# Patient Record
Sex: Male | Born: 1935 | Race: White | Hispanic: No | Marital: Married | Smoking: Never smoker
Health system: Southern US, Community
[De-identification: ages and names within clinical notes are randomized; demographics above are authoritative.]

## PROBLEM LIST (undated history)

## (undated) ENCOUNTER — Emergency Department (HOSPITAL_COMMUNITY): Discharge status: Absent without leave | Payer: PRIVATE HEALTH INSURANCE

## (undated) DIAGNOSIS — A4901 Methicillin susceptible Staphylococcus aureus infection, unspecified site: Secondary | ICD-10-CM

## (undated) DIAGNOSIS — F329 Major depressive disorder, single episode, unspecified: Secondary | ICD-10-CM

## (undated) DIAGNOSIS — N3281 Overactive bladder: Secondary | ICD-10-CM

## (undated) DIAGNOSIS — A419 Sepsis, unspecified organism: Secondary | ICD-10-CM

## (undated) DIAGNOSIS — G309 Alzheimer's disease, unspecified: Secondary | ICD-10-CM

## (undated) DIAGNOSIS — I10 Essential (primary) hypertension: Secondary | ICD-10-CM

## (undated) DIAGNOSIS — Z45018 Encounter for adjustment and management of other part of cardiac pacemaker: Secondary | ICD-10-CM

## (undated) DIAGNOSIS — N183 Chronic kidney disease, stage 3 unspecified: Secondary | ICD-10-CM

## (undated) DIAGNOSIS — Z9289 Personal history of other medical treatment: Secondary | ICD-10-CM

## (undated) DIAGNOSIS — M25511 Pain in right shoulder: Secondary | ICD-10-CM

## (undated) DIAGNOSIS — M546 Pain in thoracic spine: Principal | ICD-10-CM

## (undated) DIAGNOSIS — K5900 Constipation, unspecified: Secondary | ICD-10-CM

## (undated) DIAGNOSIS — I482 Chronic atrial fibrillation, unspecified: Secondary | ICD-10-CM

## (undated) DIAGNOSIS — M009 Pyogenic arthritis, unspecified: Secondary | ICD-10-CM

## (undated) DIAGNOSIS — F32A Depression, unspecified: Secondary | ICD-10-CM

## (undated) DIAGNOSIS — T8859XA Other complications of anesthesia, initial encounter: Secondary | ICD-10-CM

## (undated) DIAGNOSIS — G8929 Other chronic pain: Secondary | ICD-10-CM

## (undated) DIAGNOSIS — R001 Bradycardia, unspecified: Secondary | ICD-10-CM

## (undated) DIAGNOSIS — F322 Major depressive disorder, single episode, severe without psychotic features: Secondary | ICD-10-CM

## (undated) DIAGNOSIS — F4321 Adjustment disorder with depressed mood: Secondary | ICD-10-CM

## (undated) DIAGNOSIS — T827XXA Infection and inflammatory reaction due to other cardiac and vascular devices, implants and grafts, initial encounter: Secondary | ICD-10-CM

## (undated) DIAGNOSIS — I509 Heart failure, unspecified: Secondary | ICD-10-CM

## (undated) DIAGNOSIS — D649 Anemia, unspecified: Secondary | ICD-10-CM

## (undated) DIAGNOSIS — F0280 Dementia in other diseases classified elsewhere without behavioral disturbance: Secondary | ICD-10-CM

## (undated) DIAGNOSIS — M1990 Unspecified osteoarthritis, unspecified site: Secondary | ICD-10-CM

## (undated) DIAGNOSIS — I82409 Acute embolism and thrombosis of unspecified deep veins of unspecified lower extremity: Secondary | ICD-10-CM

## (undated) DIAGNOSIS — F419 Anxiety disorder, unspecified: Secondary | ICD-10-CM

## (undated) DIAGNOSIS — R7881 Bacteremia: Secondary | ICD-10-CM

## (undated) DIAGNOSIS — E039 Hypothyroidism, unspecified: Secondary | ICD-10-CM

## (undated) DIAGNOSIS — E119 Type 2 diabetes mellitus without complications: Secondary | ICD-10-CM

## (undated) DIAGNOSIS — T4145XA Adverse effect of unspecified anesthetic, initial encounter: Secondary | ICD-10-CM

## (undated) HISTORY — DX: Methicillin susceptible Staphylococcus aureus infection, unspecified site: A49.01

## (undated) HISTORY — DX: Other chronic pain: G89.29

## (undated) HISTORY — DX: Sepsis, unspecified organism: A41.9

## (undated) HISTORY — DX: Pain in right shoulder: M25.511

## (undated) HISTORY — DX: Overactive bladder: N32.81

## (undated) HISTORY — PX: COLONOSCOPY W/ POLYPECTOMY: SHX1380

## (undated) HISTORY — DX: Adjustment disorder with depressed mood: F43.21

## (undated) HISTORY — PX: EYE SURGERY: SHX253

## (undated) HISTORY — PX: JOINT REPLACEMENT: SHX530

## (undated) HISTORY — PX: INSERT / REPLACE / REMOVE PACEMAKER: SUR710

## (undated) HISTORY — DX: Dementia in other diseases classified elsewhere without behavioral disturbance: F02.80

## (undated) HISTORY — DX: Pain in thoracic spine: M54.6

## (undated) HISTORY — DX: Pyogenic arthritis, unspecified: M00.9

## (undated) HISTORY — DX: Bacteremia: R78.81

## (undated) HISTORY — DX: Infection and inflammatory reaction due to other cardiac and vascular devices, implants and grafts, initial encounter: T82.7XXA

## (undated) HISTORY — DX: Encounter for adjustment and management of other part of cardiac pacemaker: Z45.018

## (undated) HISTORY — DX: Heart failure, unspecified: I50.9

## (undated) HISTORY — DX: Major depressive disorder, single episode, severe without psychotic features: F32.2

## (undated) HISTORY — DX: Alzheimer's disease, unspecified: G30.9

## (undated) HISTORY — DX: Major depressive disorder, single episode, unspecified: F32.9

## (undated) HISTORY — PX: TONSILLECTOMY: SUR1361

---

## 1998-09-02 ENCOUNTER — Ambulatory Visit (HOSPITAL_COMMUNITY): Admission: RE | Admit: 1998-09-02 | Discharge: 1998-09-04 | Payer: Self-pay | Admitting: *Deleted

## 1998-09-02 ENCOUNTER — Encounter: Payer: Self-pay | Admitting: *Deleted

## 1998-12-25 ENCOUNTER — Encounter: Payer: Self-pay | Admitting: *Deleted

## 1998-12-25 ENCOUNTER — Ambulatory Visit (HOSPITAL_COMMUNITY): Admission: RE | Admit: 1998-12-25 | Discharge: 1998-12-25 | Payer: Self-pay | Admitting: *Deleted

## 1999-12-04 ENCOUNTER — Emergency Department (HOSPITAL_COMMUNITY): Admission: EM | Admit: 1999-12-04 | Discharge: 1999-12-04 | Payer: Self-pay | Admitting: Emergency Medicine

## 1999-12-04 ENCOUNTER — Encounter: Payer: Self-pay | Admitting: Emergency Medicine

## 2001-03-21 ENCOUNTER — Encounter: Admission: RE | Admit: 2001-03-21 | Discharge: 2001-06-19 | Payer: Self-pay | Admitting: Occupational Medicine

## 2010-11-11 ENCOUNTER — Encounter (HOSPITAL_COMMUNITY)
Admission: RE | Admit: 2010-11-11 | Discharge: 2010-11-11 | Disposition: A | Discharge status: Home / Self Care | Payer: Medicare Other | Source: Ambulatory Visit | Attending: Orthopedic Surgery | Admitting: Orthopedic Surgery

## 2010-11-11 ENCOUNTER — Other Ambulatory Visit: Payer: Self-pay | Admitting: Orthopedic Surgery

## 2010-11-11 ENCOUNTER — Other Ambulatory Visit (HOSPITAL_COMMUNITY): Payer: Self-pay | Admitting: Orthopedic Surgery

## 2010-11-11 DIAGNOSIS — M1990 Unspecified osteoarthritis, unspecified site: Secondary | ICD-10-CM

## 2010-11-11 LAB — DIFFERENTIAL
Basophils Absolute: 0 10*3/uL (ref 0.0–0.1)
Basophils Relative: 0 % (ref 0–1)
Eosinophils Absolute: 0.1 10*3/uL (ref 0.0–0.7)
Eosinophils Relative: 1 % (ref 0–5)
Lymphocytes Relative: 18 % (ref 12–46)
Lymphs Abs: 1.3 10*3/uL (ref 0.7–4.0)
Monocytes Absolute: 0.9 10*3/uL (ref 0.1–1.0)
Monocytes Relative: 12 % (ref 3–12)
Neutro Abs: 5 10*3/uL (ref 1.7–7.7)
Neutrophils Relative %: 68 % (ref 43–77)

## 2010-11-11 LAB — CBC
Hemoglobin: 11.1 g/dL — ABNORMAL LOW (ref 13.0–17.0)
MCH: 31.4 pg (ref 26.0–34.0)
MCHC: 33.2 g/dL (ref 30.0–36.0)
Platelets: 158 10*3/uL (ref 150–400)
RBC: 3.53 MIL/uL — ABNORMAL LOW (ref 4.22–5.81)

## 2010-11-11 LAB — URINALYSIS, ROUTINE W REFLEX MICROSCOPIC
Glucose, UA: NEGATIVE mg/dL
Hgb urine dipstick: NEGATIVE
Ketones, ur: NEGATIVE mg/dL
Protein, ur: NEGATIVE mg/dL
Urobilinogen, UA: 1 mg/dL (ref 0.0–1.0)

## 2010-11-11 LAB — BASIC METABOLIC PANEL
CO2: 28 mEq/L (ref 19–32)
Chloride: 108 mEq/L (ref 96–112)
Glucose, Bld: 139 mg/dL — ABNORMAL HIGH (ref 70–99)
Sodium: 144 mEq/L (ref 135–145)

## 2010-11-11 LAB — SURGICAL PCR SCREEN
MRSA, PCR: NEGATIVE
Staphylococcus aureus: NEGATIVE

## 2010-11-11 LAB — PROTIME-INR
INR: 1.92 — ABNORMAL HIGH (ref 0.00–1.49)
Prothrombin Time: 22.3 seconds — ABNORMAL HIGH (ref 11.6–15.2)

## 2010-11-11 LAB — URINE MICROSCOPIC-ADD ON

## 2010-11-21 ENCOUNTER — Inpatient Hospital Stay (HOSPITAL_COMMUNITY): Payer: Medicare Other

## 2010-11-21 ENCOUNTER — Inpatient Hospital Stay (HOSPITAL_COMMUNITY)
Admission: RE | Admit: 2010-11-21 | Discharge: 2010-11-25 | DRG: 470 | Disposition: A | Discharge status: Skilled Nursing Facility | Payer: Medicare Other | Source: Ambulatory Visit | Attending: Orthopedic Surgery | Admitting: Orthopedic Surgery

## 2010-11-21 DIAGNOSIS — E119 Type 2 diabetes mellitus without complications: Secondary | ICD-10-CM | POA: Diagnosis present

## 2010-11-21 DIAGNOSIS — Z01818 Encounter for other preprocedural examination: Secondary | ICD-10-CM

## 2010-11-21 DIAGNOSIS — Z01812 Encounter for preprocedural laboratory examination: Secondary | ICD-10-CM

## 2010-11-21 DIAGNOSIS — I10 Essential (primary) hypertension: Secondary | ICD-10-CM | POA: Diagnosis present

## 2010-11-21 DIAGNOSIS — M1610 Unilateral primary osteoarthritis, unspecified hip: Principal | ICD-10-CM | POA: Diagnosis present

## 2010-11-21 DIAGNOSIS — Z01810 Encounter for preprocedural cardiovascular examination: Secondary | ICD-10-CM

## 2010-11-21 DIAGNOSIS — I4891 Unspecified atrial fibrillation: Secondary | ICD-10-CM | POA: Diagnosis present

## 2010-11-21 DIAGNOSIS — M169 Osteoarthritis of hip, unspecified: Principal | ICD-10-CM | POA: Diagnosis present

## 2010-11-21 DIAGNOSIS — Z950 Presence of cardiac pacemaker: Secondary | ICD-10-CM

## 2010-11-21 LAB — GLUCOSE, CAPILLARY
Glucose-Capillary: 119 mg/dL — ABNORMAL HIGH (ref 70–99)
Glucose-Capillary: 176 mg/dL — ABNORMAL HIGH (ref 70–99)

## 2010-11-21 LAB — PROTIME-INR
INR: 1.38 (ref 0.00–1.49)
INR: 1.42 (ref 0.00–1.49)
Prothrombin Time: 17.2 seconds — ABNORMAL HIGH (ref 11.6–15.2)
Prothrombin Time: 17.6 seconds — ABNORMAL HIGH (ref 11.6–15.2)

## 2010-11-22 LAB — CBC
HCT: 23.8 % — ABNORMAL LOW (ref 39.0–52.0)
MCHC: 32.8 g/dL (ref 30.0–36.0)
Platelets: 151 10*3/uL (ref 150–400)
RDW: 13.2 % (ref 11.5–15.5)

## 2010-11-22 LAB — BASIC METABOLIC PANEL
CO2: 26 mEq/L (ref 19–32)
Calcium: 8.1 mg/dL — ABNORMAL LOW (ref 8.4–10.5)
Creatinine, Ser: 1.43 mg/dL — ABNORMAL HIGH (ref 0.50–1.35)
Glucose, Bld: 173 mg/dL — ABNORMAL HIGH (ref 70–99)

## 2010-11-22 LAB — PROTIME-INR: INR: 1.56 — ABNORMAL HIGH (ref 0.00–1.49)

## 2010-11-22 LAB — PREPARE RBC (CROSSMATCH)

## 2010-11-22 LAB — GLUCOSE, CAPILLARY
Glucose-Capillary: 173 mg/dL — ABNORMAL HIGH (ref 70–99)
Glucose-Capillary: 129 mg/dL — ABNORMAL HIGH (ref 70–99)

## 2010-11-22 NOTE — Op Note (Signed)
NAMEDAMARRION, MIMBS             ACCOUNT NO.:  [id number]  MEDICAL RECORD NO.:  [id number]  LOCATION:  5011                         FACILITY:  [facility]  PHYSICIAN:  Feliberto Gottron. Turner Daniels, M.D.   DATE OF BIRTH:  09-03-1930  DATE OF PROCEDURE:  11/21/2010 DATE OF DISCHARGE:                              OPERATIVE REPORT   PREOPERATIVE DIAGNOSES:  End-stage arthritis of the right hip with erosion of the femoral head, large osteophytes, severe unremitting pain.  POSTOPERATIVE DIAGNOSES:  End-stage arthritis of the right hip with erosion of the femoral head, large osteophytes, severe unremitting pain.  PROCEDURE:  Right total hip arthroplasty using a 62-mm DePuy Pinnacle Cup, Apex Hole Eliminator, 10-degree polyethylene liner index superior and posterior to accept +5 36 mm metal head on the femoral side, a #6 Summit basic stem, cemented double batch of DePuy HV cement with 1500 mg of Zinacef, 14 mm centralizer and #5 central canal occluder.  SURGEON:  Feliberto Gottron. Turner Daniels, MD  FIRS ASSISTANT:  Shirl Harris PA-C  ANESTHETIC:  General endotracheal.  ESTIMATED BLOOD LOSS:  500 mL.  FLUID REPLACEMENT:  800 mL of crystalloid.  DRAINS PLACED:  Foley catheter.  URINE OUTPUT:  300 mL.  INDICATIONS FOR PROCEDURE:  A 77 year old male with end-stage arthritis of the right hip for many years, has developed a fairly impressive 25- degree flexion contracture, external rotation contracture, x-ray showed huge osteophytes, bone-on-bone arthritic changes with erosion and flattening of the femoral head.  He has failed conservative measures with observation, anti-inflammatory medicine, narcotics and can now barely ambulate.  He desires elective right total hip arthroplasty to decrease pain and increase function.  Risks and benefits of surgery had been discussed.  Questions answered.  DESCRIPTION OF PROCEDURE:  The patient was identified by armband, received preoperative IV antibiotics in the holding  area at [facility], taken to operating room 10, appropriate anesthetic monitors were attached and general endotracheal anesthesia induced.  The patient in supine position, Foley catheter was inserted.  He was rolled into the left lateral decubitus position and fixed there with a Stulberg Mark II pelvic clamp  and the right lower extremity was then prepped and draped in sterile fashion from the ankle to the hemipelvis and a time-out procedure performed.  The skin along the lateral hip and thigh infiltrated with 10 mL of 0.5% Marcaine and epinephrine solution.  We then made a posterolateral approach to the hip.  With a #10 blade, 18 cm incision through skin and subcutaneous tissue down to the level of the IT band.  Small bleeders were identified and cauterized.  IT band cut in line with skin incision exposing the greater trochanter and the short external rotators posteriorly.  These were tagged with two #3 Ethibond sutures and cut off their insertion on the intertrochanteric crest.  A spike Cobra retractor was placed between the gluteus minimus and the inferior hip joint capsule, and a standard Cobra between the gluteus minimus and superior hip joint capsule.  This isolated the posterior capsule, which was then developed into an acetabular-based flap and tagged with two #2 Ethibond sutures.  After incising the capsule from posterior-superior out of the femoral neck and exiting posterior-  inferior.  This exposed the arthritic femoral head and huge osteophytes, some of them had to be removed.  The hip was then flexed and internally rotated, dislocating the femoral head and a standard neck cut performed 1 fingerbreadth above the lesser trochanter.  Using half-inch and quarter-inch osteotomes and rongeur, we then removed the large peripheral osteophytes that were pretty much replaced with labrum around the acetabulum.  A spike Cobra was placed in the cotyloid notch.  A Hohmann retractor  was then used to lever the femur anteriorly and a posterior-inferior wing retractor was placed at the junction of the acetabulum and the ischium.  We then reamed the acetabulum up to 61 mm obtaining good coverage in all quadrants, irrigated out with normal saline solution and hammered into place a 62-mm pinnacle cup in 45 degrees of abduction and about 20 degrees of anteversion.  More peripheral osteophytes removed and a trial 10-degree liner placed in the index superior.  Hip was then flexed and internally rotated exposing the proximal femur, which was entered with the initiating reamer followed by the tapered reamers up to a 6-7 tapered reamer and broaching up to a #6 broach, which had good fit and fill.  A trial reduction was then performed with a +0 and +5 36-mm balls on the standard neck and excellent stability was noted with a +5 to 90 of flexion with 60 of internal rotation and then full extension external rotation.  The hip could not be dislocated in full extension.  The knee could easily flex to about 140 degrees.  We also stretched the abductors at this point, because of the preexisting adductor contractures.  All trial components were then removed.  The acetabulum was irrigated out with normal saline solution.  A titanium Apex [facility] was then screwed into place followed by a 10-degree polyethylene liner index superior-posterior.  On the femoral side, we then sized for a #5 cement restrictor and irrigated out the femoral canal with normal saline solution and dried with suction and sponges.  The cement restrictor was placed the appropriate depth.  A double batch of DePuy 1 cement with 1500 mg of Zinacef was mixed and injected into the canal under pressure followed by #6 Summit basic stem in 20 degrees of anteversion and this was held in place, excess cement was removed and a cement rod.  At this point, a +5 36-mm metal head was hammered on the stem.  The hip was reduced.   We checked our stability one more time and found to be excellent.  The wound was once again thoroughly irrigated out with normal saline solution pulse lavage.  The capsular flap and short external rotators were repaired back to the intertrochanteric crest through drill holes with a #2 Ethibond suture. The IT band was closed with running 1 Vicryl suture.  The subcutaneous tissue with 0 and 2-0 undyed Vicryl suture and the skin with running interlocking 3-0 nylon suture.  Dressing of Xeroform and Mepilex was then applied.  The patient was awakened, unclamped, rolled supine, extubated and taken to recovery room without difficulty.     Feliberto Gottron. Turner Daniels, M.D.     [name]  D:  11/21/2010  T:  11/21/2010  Job:  [id number]  Electronically Signed by Gean Birchwood M.D. on 11/22/2010 06:06:34 AM

## 2010-11-23 LAB — CBC
Hemoglobin: 8.9 g/dL — ABNORMAL LOW (ref 13.0–17.0)
RBC: 2.94 MIL/uL — ABNORMAL LOW (ref 4.22–5.81)
WBC: 8.6 10*3/uL (ref 4.0–10.5)

## 2010-11-23 LAB — GLUCOSE, CAPILLARY
Glucose-Capillary: 129 mg/dL — ABNORMAL HIGH (ref 70–99)
Glucose-Capillary: 146 mg/dL — ABNORMAL HIGH (ref 70–99)
Glucose-Capillary: 133 mg/dL — ABNORMAL HIGH (ref 70–99)

## 2010-11-23 LAB — TYPE AND SCREEN
ABO/RH(D): A POS
Unit division: 0

## 2010-11-23 LAB — PROTIME-INR
INR: 1.79 — ABNORMAL HIGH (ref 0.00–1.49)
Prothrombin Time: 21.1 seconds — ABNORMAL HIGH (ref 11.6–15.2)

## 2010-11-24 LAB — GLUCOSE, CAPILLARY
Glucose-Capillary: 132 mg/dL — ABNORMAL HIGH (ref 70–99)
Glucose-Capillary: 131 mg/dL — ABNORMAL HIGH (ref 70–99)

## 2010-11-24 LAB — CBC
HCT: 25.8 % — ABNORMAL LOW (ref 39.0–52.0)
Hemoglobin: 8.5 g/dL — ABNORMAL LOW (ref 13.0–17.0)
MCH: 30.2 pg (ref 26.0–34.0)
MCHC: 32.9 g/dL (ref 30.0–36.0)
MCV: 91.8 fL (ref 78.0–100.0)

## 2010-11-24 LAB — PROTIME-INR: INR: 1.79 — ABNORMAL HIGH (ref 0.00–1.49)

## 2010-11-24 NOTE — Discharge Summary (Signed)
[name], [name]             ACCOUNT NO.:  [id number]  MEDICAL RECORD NO.:  [id number]  LOCATION:  5011                         FACILITY:  [facility]  PHYSICIAN:  Feliberto Gottron. Turner Daniels, M.D.   DATE OF BIRTH:  September 21, 1930  DATE OF ADMISSION:  11/21/2010 DATE OF DISCHARGE:  11/24/2010                        DISCHARGE SUMMARY - REFERRING   CHIEF COMPLAINT:  End-stage arthritis of right hip, bone-on-bone with severe unremitting pain.  HISTORY OF PRESENT ILLNESS:  The patient is a 77 year old gentleman with end-stage arthritis of the right hip, severe bone-on-bone by x-rays, failed conservative measures with anti-inflammatory medicines, attempts at weight loss, activity modification, and judicious use of narcotics. Plain radiographs were consistent with end-stage bone-on-bone arthritis. It is severe, disabling, wakes him up at night and prevents activities of daily living.  He has had trouble getting around the house and it is miserable.  He has also tried the use of a cane and a walker, and despite these maneuvers, the pain is severe and unremitting.  He is admitted for elective right total hip arthroplasty.  PAST MEDICAL HISTORY:  Significant for diabetes that is controlled with oral medications.  He also has high blood pressure and elevated cholesterol.  His diabetes is relatively stable and his glucose usually runs between now 100 to 120.  PREOPERATIVE LABORATORY STUDIES:  Revealed he will be mildly anemic with a hemoglobin of 11.1.  Urinalysis was positive for moderate leukocytes and nitrates.  On his basic metabolic profile, his creatinine 1.42 and glucose was 139.  PREOPERATIVE CHEST X-RAY AND EKG:  Unremarkable.  HOSPITAL COURSE:  On the date of admission, the patient was taken to the operating room and underwent a routine right total arthroplasty using 62- mm Pinnacle Cup and polyethylene liner.  On the femoral side, we used a #6 Summit basic stem with a double batch of DePuy 1  cement with 1500 mg of Zinacef.  The cup was also DePuy.  He had tolerated the procedure well and was transferred to the ward and mobilized with physical therapy.  Weightbearing as tolerated.  Day after surgery, his hemoglobin came back at 7.8 with hematocrit of 23.8, so he did receive 2 units of packed red blood cells and tolerated transfusion well.  The day after the transfusions, his hemoglobin was up to 8.9 and today, it is November 24, 2010.  His hemoglobin was stable at 8.5.  The wound remained clean and dry.  His sugars remained between 100 to 140.  He was able walk in the hallway easily with physical therapy, and later today, they are hoping to have him into the steroid stimulator.  He was also anticoagulated with Lovenox and Coumadin bridge.  On the date of this dictation, his INR gone from 1.1 to 1.8.  We did use PAS hose immediately after surgery as well as an incentive spirometry, and he has remained afebrile.  Assuming he passes PT, he is ready for discharge today.  FINAL DIAGNOSES:  End-stage arthritis of the right hip and diabetes.  CONDITION AT DISCHARGE:  Excellent.  PLAN:  He will be going to a skilled nursing facility for a week or 2 of rehab and then home.  The stitches will be  coming out at the 2-week mark.  He will resume his preoperative medications with the addition of Percocet and Coumadin, and he will be on the Coumadin for about 2 weeks and again will remain on weightbearing-as-tolerated state.     Feliberto Gottron. Turner Daniels, M.D.     [name]  D:  11/24/2010  T:  11/24/2010  Job:  [id number]  Electronically Signed by Gean Birchwood M.D. on 11/24/2010 10:40:58 PM

## 2010-11-25 LAB — PROTIME-INR: INR: 1.69 — ABNORMAL HIGH (ref 0.00–1.49)

## 2010-11-25 LAB — GLUCOSE, CAPILLARY: Glucose-Capillary: 114 mg/dL — ABNORMAL HIGH (ref 70–99)

## 2010-11-26 ENCOUNTER — Emergency Department (HOSPITAL_COMMUNITY): Payer: Medicare Other

## 2010-11-26 ENCOUNTER — Inpatient Hospital Stay (HOSPITAL_COMMUNITY)
Admission: EM | Admit: 2010-11-26 | Discharge: 2010-12-02 | DRG: 560 | Disposition: A | Discharge status: Skilled Nursing Facility | Payer: Medicare Other | Attending: Orthopedic Surgery | Admitting: Orthopedic Surgery

## 2010-11-26 DIAGNOSIS — E119 Type 2 diabetes mellitus without complications: Secondary | ICD-10-CM | POA: Diagnosis present

## 2010-11-26 DIAGNOSIS — I959 Hypotension, unspecified: Secondary | ICD-10-CM | POA: Diagnosis not present

## 2010-11-26 DIAGNOSIS — M129 Arthropathy, unspecified: Secondary | ICD-10-CM | POA: Diagnosis present

## 2010-11-26 DIAGNOSIS — I4891 Unspecified atrial fibrillation: Secondary | ICD-10-CM | POA: Diagnosis present

## 2010-11-26 DIAGNOSIS — F411 Generalized anxiety disorder: Secondary | ICD-10-CM | POA: Diagnosis present

## 2010-11-26 DIAGNOSIS — E785 Hyperlipidemia, unspecified: Secondary | ICD-10-CM | POA: Diagnosis present

## 2010-11-26 DIAGNOSIS — K560 Paralytic ileus: Secondary | ICD-10-CM | POA: Diagnosis not present

## 2010-11-26 DIAGNOSIS — T84029A Dislocation of unspecified internal joint prosthesis, initial encounter: Principal | ICD-10-CM | POA: Diagnosis present

## 2010-11-26 DIAGNOSIS — D62 Acute posthemorrhagic anemia: Secondary | ICD-10-CM | POA: Diagnosis not present

## 2010-11-26 DIAGNOSIS — Y831 Surgical operation with implant of artificial internal device as the cause of abnormal reaction of the patient, or of later complication, without mention of misadventure at the time of the procedure: Secondary | ICD-10-CM | POA: Diagnosis present

## 2010-11-26 DIAGNOSIS — E039 Hypothyroidism, unspecified: Secondary | ICD-10-CM | POA: Diagnosis present

## 2010-11-26 DIAGNOSIS — N400 Enlarged prostate without lower urinary tract symptoms: Secondary | ICD-10-CM | POA: Diagnosis present

## 2010-11-26 DIAGNOSIS — S73004A Unspecified dislocation of right hip, initial encounter: Secondary | ICD-10-CM | POA: Diagnosis present

## 2010-11-26 DIAGNOSIS — I509 Heart failure, unspecified: Secondary | ICD-10-CM | POA: Diagnosis present

## 2010-11-26 DIAGNOSIS — I10 Essential (primary) hypertension: Secondary | ICD-10-CM

## 2010-11-26 DIAGNOSIS — Z7901 Long term (current) use of anticoagulants: Secondary | ICD-10-CM

## 2010-11-26 DIAGNOSIS — Z96649 Presence of unspecified artificial hip joint: Secondary | ICD-10-CM

## 2010-11-26 LAB — GLUCOSE, CAPILLARY: Glucose-Capillary: 141 mg/dL — ABNORMAL HIGH (ref 70–99)

## 2010-11-26 LAB — BASIC METABOLIC PANEL
BUN: 13 mg/dL (ref 6–23)
Creatinine, Ser: 1.17 mg/dL (ref 0.50–1.35)
GFR calc Af Amer: 67 mL/min — ABNORMAL LOW (ref >90)
GFR calc non Af Amer: 58 mL/min — ABNORMAL LOW (ref >90)

## 2010-11-26 LAB — PROTIME-INR: Prothrombin Time: 23.1 seconds — ABNORMAL HIGH (ref 11.6–15.2)

## 2010-11-26 LAB — DIFFERENTIAL
Basophils Relative: 0 % (ref 0–1)
Eosinophils Absolute: 0 10*3/uL (ref 0.0–0.7)
Monocytes Absolute: 1.1 10*3/uL — ABNORMAL HIGH (ref 0.1–1.0)
Monocytes Relative: 12 % (ref 3–12)
Neutrophils Relative %: 76 % (ref 43–77)

## 2010-11-26 LAB — CBC
MCH: 31.5 pg (ref 26.0–34.0)
MCHC: 34.5 g/dL (ref 30.0–36.0)
Platelets: 227 10*3/uL (ref 150–400)
RBC: 2.92 MIL/uL — ABNORMAL LOW (ref 4.22–5.81)

## 2010-11-27 ENCOUNTER — Inpatient Hospital Stay (HOSPITAL_COMMUNITY): Payer: Medicare Other

## 2010-11-27 LAB — GLUCOSE, CAPILLARY
Glucose-Capillary: 148 mg/dL — ABNORMAL HIGH (ref 70–99)
Glucose-Capillary: 147 mg/dL — ABNORMAL HIGH (ref 70–99)
Glucose-Capillary: 129 mg/dL — ABNORMAL HIGH (ref 70–99)

## 2010-11-27 LAB — BASIC METABOLIC PANEL
Chloride: 105 mEq/L (ref 96–112)
Creatinine, Ser: 1.04 mg/dL (ref 0.50–1.35)
GFR calc Af Amer: 77 mL/min — ABNORMAL LOW (ref >90)
Potassium: 3.6 mEq/L (ref 3.5–5.1)
Sodium: 138 mEq/L (ref 135–145)

## 2010-11-27 LAB — PROTIME-INR
INR: 2.6 — ABNORMAL HIGH (ref 0.00–1.49)
Prothrombin Time: 28.3 seconds — ABNORMAL HIGH (ref 11.6–15.2)

## 2010-11-27 LAB — CBC
HCT: 26 % — ABNORMAL LOW (ref 39.0–52.0)
Hemoglobin: 8.6 g/dL — ABNORMAL LOW (ref 13.0–17.0)
RBC: 2.86 MIL/uL — ABNORMAL LOW (ref 4.22–5.81)
WBC: 8.3 10*3/uL (ref 4.0–10.5)

## 2010-11-28 DIAGNOSIS — M1610 Unilateral primary osteoarthritis, unspecified hip: Secondary | ICD-10-CM

## 2010-11-28 DIAGNOSIS — Z96649 Presence of unspecified artificial hip joint: Secondary | ICD-10-CM

## 2010-11-28 DIAGNOSIS — S73006A Unspecified dislocation of unspecified hip, initial encounter: Secondary | ICD-10-CM

## 2010-11-28 LAB — CBC
Hemoglobin: 8.9 g/dL — ABNORMAL LOW (ref 13.0–17.0)
MCV: 91.8 fL (ref 78.0–100.0)
Platelets: 243 10*3/uL (ref 150–400)
RBC: 2.92 MIL/uL — ABNORMAL LOW (ref 4.22–5.81)
WBC: 8.9 10*3/uL (ref 4.0–10.5)

## 2010-11-28 LAB — GLUCOSE, CAPILLARY: Glucose-Capillary: 104 mg/dL — ABNORMAL HIGH (ref 70–99)
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
## 2010-11-29 DIAGNOSIS — M79609 Pain in unspecified limb: Secondary | ICD-10-CM

## 2010-11-29 LAB — GLUCOSE, CAPILLARY: Glucose-Capillary: 111 mg/dL — ABNORMAL HIGH (ref 70–99)

## 2010-11-29 MED ORDER — METHOCARBAMOL 500 MG PO TABS
500.0000 mg | ORAL_TABLET | Freq: Four times a day (QID) | ORAL | Status: DC | PRN
  Administered 2010-11-30 – 2010-12-02 (×5): 500 mg via ORAL
  Filled 2010-11-29: qty 1

## 2010-11-29 MED ORDER — TERAZOSIN HCL 5 MG PO CAPS
5.0000 mg | ORAL_CAPSULE | Freq: Every day | ORAL | Status: DC
  Administered 2010-11-30 – 2010-12-01 (×3): 5 mg via ORAL
  Filled 2010-11-29 (×5): qty 1

## 2010-11-29 MED ORDER — SORBITOL 70 % SOLN
25.0000 mL | Freq: Two times a day (BID) | Status: DC
  Administered 2010-11-30 (×2): 25 mL via ORAL
  Administered 2010-12-01 (×2): 30 mL via ORAL
  Administered 2010-12-02: 25 mL via ORAL
  Filled 2010-11-29 (×8): qty 30

## 2010-11-29 MED ORDER — TRAMADOL HCL 50 MG PO TABS
50.0000 mg | ORAL_TABLET | Freq: Three times a day (TID) | ORAL | Status: DC
  Administered 2010-11-30 – 2010-12-02 (×8): 50 mg via ORAL
  Filled 2010-11-29 (×6): qty 1

## 2010-11-29 MED ORDER — PROCHLORPERAZINE MALEATE 5 MG PO TABS
5.0000 mg | ORAL_TABLET | Freq: Four times a day (QID) | ORAL | Status: DC | PRN
  Filled 2010-11-29: qty 2

## 2010-11-29 MED ORDER — MENTHOL 3 MG MT LOZG: 1.0000 | LOZENGE | OROMUCOSAL | Status: DC | PRN

## 2010-11-29 MED ORDER — METOPROLOL TARTRATE 1 MG/ML IV SOLN
5.0000 mg | Freq: Two times a day (BID) | INTRAVENOUS | Status: DC | PRN
  Filled 2010-11-29: qty 5

## 2010-11-29 MED ORDER — SIMVASTATIN 20 MG PO TABS
20.0000 mg | ORAL_TABLET | Freq: Every day | ORAL | Status: DC
  Administered 2010-11-30 – 2010-12-01 (×2): 20 mg via ORAL
  Filled 2010-11-29 (×4): qty 1

## 2010-11-29 MED ORDER — ACETAMINOPHEN 325 MG PO TABS
325.0000 mg | ORAL_TABLET | ORAL | Status: DC | PRN
  Administered 2010-12-01: 650 mg via ORAL
  Filled 2010-11-29: qty 2

## 2010-11-29 MED ORDER — METOCLOPRAMIDE HCL 10 MG PO TABS: 5.0000 mg | ORAL_TABLET | Freq: Three times a day (TID) | ORAL | Status: DC | PRN

## 2010-11-29 MED ORDER — ALUM & MAG HYDROXIDE-SIMETH 200-200-20 MG/5ML PO SUSP: 30.0000 mL | ORAL | Status: DC | PRN

## 2010-11-29 MED ORDER — OLMESARTAN MEDOXOMIL 20 MG PO TABS
10.0000 mg | ORAL_TABLET | Freq: Every day | ORAL | Status: DC
  Administered 2010-11-30: 10 mg via ORAL
  Filled 2010-11-29 (×2): qty 0.5

## 2010-11-29 MED ORDER — ACETAMINOPHEN 650 MG RE SUPP: 325.0000 mg | RECTAL | Status: DC | PRN

## 2010-11-29 MED ORDER — BISACODYL 5 MG PO TBEC: 10.0000 mg | DELAYED_RELEASE_TABLET | Freq: Every day | ORAL | Status: DC | PRN

## 2010-11-29 MED ORDER — FUROSEMIDE 40 MG PO TABS
40.0000 mg | ORAL_TABLET | Freq: Every day | ORAL | Status: DC
  Administered 2010-11-30 – 2010-12-02 (×3): 40 mg via ORAL
  Filled 2010-11-29 (×4): qty 1

## 2010-11-29 MED ORDER — LEVOTHYROXINE SODIUM 75 MCG PO TABS
75.0000 ug | ORAL_TABLET | Freq: Every day | ORAL | Status: DC
  Administered 2010-11-30 – 2010-12-02 (×3): 75 ug via ORAL
  Filled 2010-11-29 (×4): qty 1

## 2010-11-29 MED ORDER — DEXTROSE-NACL 5-0.45 % IV SOLN: INTRAVENOUS | Status: DC

## 2010-11-29 MED ORDER — ALPRAZOLAM 0.5 MG PO TABS: 0.5000 mg | ORAL_TABLET | Freq: Every day | ORAL | Status: DC | PRN

## 2010-11-29 MED ORDER — LEVOTHYROXINE SODIUM 100 MCG IV SOLR
37.5000 ug | Freq: Every day | INTRAVENOUS | Status: DC
  Filled 2010-11-29 (×2): qty 1.9

## 2010-11-29 MED ORDER — OXYCODONE HCL 5 MG PO TABS
5.0000 mg | ORAL_TABLET | ORAL | Status: DC | PRN
  Filled 2010-11-29: qty 2

## 2010-11-29 MED ORDER — METHOCARBAMOL 100 MG/ML IJ SOLN
500.0000 mg | Freq: Four times a day (QID) | INTRAVENOUS | Status: DC | PRN
  Filled 2010-11-29: qty 5

## 2010-11-29 MED ORDER — METOPROLOL TARTRATE 100 MG PO TABS
100.0000 mg | ORAL_TABLET | Freq: Two times a day (BID) | ORAL | Status: DC
  Administered 2010-11-30: 100 mg via ORAL
  Filled 2010-11-29 (×3): qty 1

## 2010-11-29 MED ORDER — ONDANSETRON HCL 4 MG PO TABS: 4.0000 mg | ORAL_TABLET | Freq: Four times a day (QID) | ORAL | Status: DC | PRN

## 2010-11-29 MED ORDER — PHENOL 1.4 % MT LIQD
1.0000 | OROMUCOSAL | Status: DC | PRN
  Filled 2010-11-29: qty 177

## 2010-11-29 MED ORDER — BISACODYL 10 MG RE SUPP
10.0000 mg | Freq: Every day | RECTAL | Status: DC | PRN
Start: 2010-11-29 — End: 2010-12-02

## 2010-11-29 MED ORDER — FLEET ENEMA 7-19 GM/118ML RE ENEM: 1.0000 | ENEMA | Freq: Every day | RECTAL | Status: DC | PRN

## 2010-11-29 MED ORDER — FUROSEMIDE 10 MG/ML IJ SOLN: 40.0000 mg | Freq: Every day | INTRAMUSCULAR | Status: DC

## 2010-11-29 MED ORDER — ALPRAZOLAM 0.5 MG PO TABS: 0.5000 mg | ORAL_TABLET | Freq: Two times a day (BID) | ORAL | Status: DC | PRN

## 2010-11-29 MED ORDER — ZOLPIDEM TARTRATE 5 MG PO TABS: 5.0000 mg | ORAL_TABLET | Freq: Every evening | ORAL | Status: DC | PRN

## 2010-11-29 MED ORDER — METOLAZONE 2.5 MG PO TABS: 2.5000 mg | ORAL_TABLET | ORAL | Status: DC

## 2010-11-29 MED ORDER — ONDANSETRON HCL 4 MG/2ML IJ SOLN: 4.0000 mg | Freq: Four times a day (QID) | INTRAMUSCULAR | Status: DC | PRN

## 2010-11-29 MED ORDER — METOCLOPRAMIDE HCL 5 MG/ML IJ SOLN
5.0000 mg | Freq: Three times a day (TID) | INTRAMUSCULAR | Status: DC | PRN
  Filled 2010-11-29: qty 2

## 2010-11-29 MED ORDER — SENNA 8.6 MG PO TABS
1.0000 | ORAL_TABLET | Freq: Two times a day (BID) | ORAL | Status: DC
  Administered 2010-11-30: 09:00:00 via ORAL
  Administered 2010-11-30 – 2010-12-02 (×4): 8.6 mg via ORAL
  Filled 2010-11-29 (×8): qty 1

## 2010-11-29 MED ORDER — INSULIN ASPART 100 UNIT/ML ~~LOC~~ SOLN
0.0000 [IU] | Freq: Three times a day (TID) | SUBCUTANEOUS | Status: DC
  Administered 2010-11-30 – 2010-12-02 (×5): 2 [IU] via SUBCUTANEOUS
  Filled 2010-11-29: qty 3

## 2010-11-29 MED ORDER — FINASTERIDE 5 MG PO TABS
5.0000 mg | ORAL_TABLET | Freq: Every day | ORAL | Status: DC
  Administered 2010-11-30 – 2010-12-02 (×3): 5 mg via ORAL
  Filled 2010-11-29 (×4): qty 1

## 2010-11-29 NOTE — Progress Notes (Signed)
[name], [name] NO.:  [id number]  MEDICAL RECORD NO.:  [id number]  LOCATION:                               FACILITY:  [facility]  PHYSICIAN:  Pleas Koch, MD        DATE OF BIRTH:  May 22, 1930                                PROGRESS NOTE   INTERIM HISTORY: This is a consult that I saw on November 27, 2010, for multiple medical issues.  The patient actually came into the hospital on November 21, 2010, for right total hip arthroplasty, went to nursing home and then came back because of displaced right total hip arthroplasty and I was consulted regarding complete medical management, as the patient has multiple comorbidities as listed below.  The patient is doing well today, has no issues.  He states that he has increased right versus left- sided lower extremity swelling despite being on Lasix.  He has no other pain and other than being constipated has no chest pain or shortness of breath.  No other issues.  PHYSICAL EXAMINATION: VITAL SIGNS:  Temperature 97.9, pulse 88, respirations 18, blood pressure 139-159 over 84-91. CHEST:  Clinically clear. HEART:  S1, S2.  No murmurs, rubs, or gallops.  Irregularly irregular rhythm. ABDOMEN:  Soft, nontender.  Passing flatus, but no stool of yet. EXTREMITIES:  Right lower extremity seems 1.34 times as large as the left lower extremity.  HOSPITAL COURSE: 1. ?  DVT - get ultrasound lower extremities stat.  The patient     already on Coumadin.  Explained to the patient and family need for     hem/onc versus surgical intervention if indeed DVT is present in     terms a [place] filter. 2. Day #3, status post right total hip replacement, status post     relocation.  Per orthopedics, I have recommended to decrease     opiates to help with constipation.  Please see problem #3. 3. Constipation.  The patient has not passed stool in about 8 days.     He had a small tiny bit and has been placed on Dulcolax by     orthopedic  surgeon in addition to sorbitol by myself.  The patient     will continue on these medications and hopefully taking full p.o.     diet today as he has been graduated by Orthopedics who will help     resolve this. 4. AFib status post pacemaker on Coumadin.  The patient is rate     controlled.  Currently on metoprolol.  The patient will continue on     this and his heart rate has been well controlled over the last     couple of days. 5. Hyperlipidemia.  The patient will continue on simvastatin 10 mg at     bedtime. 6. CHF.  I am unsure as to what his actual EF is.  I did try to     contact Dr. Tereso Newcomer with regards to getting more pertinent     information.  However, I have simplified his medication and place     him only on Lasix p.o. and held off on his metolazone given he  might develop some renal insufficiency - we will get a B-MET in the     morning. 7. Benign prostatic hypertrophy.  He will continue on his terazosin     for the same. 8. Hypertension.  This is moderately well controlled.  He will     continue on his Benicar as well as his metoprolol at present time. 9. Pain.  We would discontinue his celecoxib given the fact that this     can worsen CHF and he was placed on low-dose tramadol for chronic     pain issues as well and will continue Robaxin that he is currently     on. 10.Hypothyroidism.  The patient will continue his levothyroxine. 11.Diet-controlled diabetes mellitus.  This will continued to be     controlled with insulin while acutely in the hospital.  He will     benefit from A1c and dietary modification as well as discussion     with primary care physician, whether he would benefit from     metformin as an outpatient.  That brings this hospitalization currently up to date.  Please see my hand written note for further details from today.          ______________________________ Pleas Koch, MD     JS/MEDQ  D:  11/29/2010  T:  11/29/2010  Job:   [id number]  Electronically Signed by Pleas Koch MD on 11/29/2010 05:26:01 PM

## 2010-11-30 DIAGNOSIS — I10 Essential (primary) hypertension: Secondary | ICD-10-CM

## 2010-11-30 DIAGNOSIS — S73004A Unspecified dislocation of right hip, initial encounter: Secondary | ICD-10-CM | POA: Diagnosis present

## 2010-11-30 LAB — DIFFERENTIAL
Basophils Relative: 1 % (ref 0–1)
Eosinophils Absolute: 0.2 10*3/uL (ref 0.0–0.7)
Monocytes Absolute: 1.1 10*3/uL — ABNORMAL HIGH (ref 0.1–1.0)
Monocytes Relative: 12 % (ref 3–12)

## 2010-11-30 LAB — CBC
HCT: 26.9 % — ABNORMAL LOW (ref 39.0–52.0)
Hemoglobin: 8.7 g/dL — ABNORMAL LOW (ref 13.0–17.0)
MCH: 30 pg (ref 26.0–34.0)
MCHC: 32.3 g/dL (ref 30.0–36.0)

## 2010-11-30 LAB — BASIC METABOLIC PANEL
BUN: 10 mg/dL (ref 6–23)
Calcium: 9 mg/dL (ref 8.4–10.5)
Creatinine, Ser: 1.22 mg/dL (ref 0.50–1.35)
GFR calc Af Amer: 64 mL/min — ABNORMAL LOW (ref >90)
GFR calc non Af Amer: 55 mL/min — ABNORMAL LOW (ref >90)

## 2010-11-30 LAB — GLUCOSE, CAPILLARY: Glucose-Capillary: 118 mg/dL — ABNORMAL HIGH (ref 70–99)

## 2010-11-30 MED ORDER — WARFARIN SODIUM 4 MG PO TABS
4.0000 mg | ORAL_TABLET | Freq: Once | ORAL | Status: AC
  Administered 2010-11-30: 4 mg via ORAL
  Filled 2010-11-30: qty 1

## 2010-11-30 MED ORDER — DOCUSATE SODIUM 100 MG PO CAPS
100.0000 mg | ORAL_CAPSULE | Freq: Two times a day (BID) | ORAL | Status: DC
  Administered 2010-11-30 – 2010-12-02 (×5): 100 mg via ORAL
  Filled 2010-11-30 (×4): qty 1

## 2010-11-30 MED ORDER — METOPROLOL SUCCINATE 12.5 MG HALF TABLET
12.5000 mg | ORAL_TABLET | Freq: Every day | ORAL | Status: DC
  Administered 2010-12-02: 12.5 mg via ORAL
  Filled 2010-11-30 (×2): qty 0.5

## 2010-11-30 NOTE — Progress Notes (Signed)
Subjective: No new issues. Noted hypotension today with home dosages of metoprolol. Her family concerned about lack of mobility today  Doppler ultrasound of both lower extremities negative for DVT.       Objective: Weight change:   Intake/Output Summary (Last 24 hours) at 11/30/10 1742 Last data filed at 11/30/10 1052  Gross per 24 hour  Intake   1480 ml  Output    600 ml  Net    880 ml    Filed Vitals:   11/30/10 1445  BP: 82/50  Pulse: 70  Temp: 98.7 F (37.1 C)  Resp: 18    HEENT no pallor no icterus the neck CHEST clinically clear no added sounds CARDS S1-S2 no murmur rub or gallop greater than  ABD soft nontender nondistended NEURO 2 through 12 are intact SKIN wound seems clean  Lab Results: Lab Results  Component Value Date   WBC 8.5 11/30/2010   HGB 8.7* 11/30/2010   HCT 26.9* 11/30/2010   MCV 92.8 11/30/2010   PLT 300 11/30/2010   Lab Results  Component Value Date   CREATININE 1.22 11/30/2010   BUN 10 11/30/2010   NA 138 11/30/2010   K 3.3* 11/30/2010   CL 99 11/30/2010   CO2 31 11/30/2010    Micro Results: No results found for this or any previous visit (from the past 240 hour(s)).  Studies/Results: Dg Chest 2 View  11/11/2010  *RADIOLOGY REPORT*  Clinical Data: Right hip replacement.  Preop radiograph.  CHEST - 2 VIEW  Comparison: None  Findings: There is a left chest wall pacer device with leads in the right atrial appendage and right ventricle.  Heart size is mildly enlarged.  No pleural effusion or pulmonary interstitial edema identified.  Advanced spondylosis within the thoracic spine.  IMPRESSION:  1.  No active cardiopulmonary abnormalities.  Original Report Authenticated By: Rosealee Albee, M.D.   Dg Hip 1 View Right  11/26/2010  *RADIOLOGY REPORT*  Clinical Data: Closed reduction film for hip dislocation.  RIGHT HIP - 1 VIEW  Comparison: 11/26/2010 at 2127 hours  Findings: A single spot fluoroscopic view of the right hip is obtained,  demonstrating relocation of the right hip joint prosthesis.  IMPRESSION: Spot fluoroscopic views of the right hip demonstrates relocation of the right hip joint prosthesis.  Original Report Authenticated By: Marlon Pel, M.D.   Dg Hip Complete Right  11/26/2010  *RADIOLOGY REPORT*  Clinical Data: Right hip pain  RIGHT HIP - COMPLETE 2+ VIEW  Comparison: 11/21/2010  Findings: Superolateral dislocation of the right hip femoral prosthesis from the acetabular component.  Somewhat steep acetabular angle.  No acute fractures.  IMPRESSION: Superolateral dislocation of the right hip prosthesis.  Original Report Authenticated By: Marlon Pel, M.D.   Dg Pelvis Portable  11/21/2010  *RADIOLOGY REPORT*  Clinical Data: Right hip replacement.  PORTABLE PELVIS,PORTABLE RIGHT HIP - 1 VIEW  Comparison: None.  Findings: Total right hip replacement appears in satisfactory position.  Left hip joint degenerative changes.  IMPRESSION: Right total hip replacement appears in satisfactory position.  Original Report Authenticated By: Fuller Canada, M.D.   Dg Hip Portable 1 View Right  11/21/2010  *RADIOLOGY REPORT*  Clinical Data: Right hip replacement.  PORTABLE PELVIS,PORTABLE RIGHT HIP - 1 VIEW  Comparison: None.  Findings: Total right hip replacement appears in satisfactory position.  Left hip joint degenerative changes.  IMPRESSION: Right total hip replacement appears in satisfactory position.  Original Report Authenticated By: Fuller Canada, M.D.  Dg Abd Acute W/chest  11/27/2010  *RADIOLOGY REPORT*  Clinical Data: Abdominal pain, constipation, question ileus  ACUTE ABDOMEN SERIES (ABDOMEN 2 VIEW & CHEST 1 VIEW)  Comparison: Chest radiograph 11/11/2010  Findings: Left subclavian sequential transvenous pacemaker leads project at right atrium and right ventricle. Enlargement of cardiac silhouette. Tortuous aorta. Pulmonary vascularity normal. Lungs clear. Multilevel degenerative disc disease changes  thoracolumbar spine with disc space narrowing and endplate spur formation. Osseous demineralization. Right hip prosthesis. Nonobstructive bowel gas pattern. No bowel dilatation, bowel wall thickening, or free intraperitoneal air. No definite urinary tract calcification.  IMPRESSION: Nonobstructive bowel gas pattern. Mild enlargement of cardiac silhouette post pacemaker. No definite acute abnormalities.  Original Report Authenticated By: Lollie Marrow, M.D.   Dg C-arm 1-60 Min  11/27/2010  CLINICAL DATA: dislocation   C-ARM 1-60 MINUTES  Fluoroscopy was utilized by the requesting physician.  No radiographic  interpretation.     Medications: Scheduled Meds:   . docusate sodium  100 mg Oral BID  . finasteride  5 mg Oral Daily  . furosemide  40 mg Oral Daily  . insulin aspart  0-15 Units Subcutaneous TID WC  . levothyroxine  75 mcg Oral QAC breakfast  . metolazone  2.5 mg Oral Q M,W,F-1800  . metoprolol succinate  12.5 mg Oral Daily  . olmesartan  10 mg Oral Daily  . senna  1 tablet Oral BID AC  . simvastatin  20 mg Oral q1800  . sorbitol  25 mL Oral BID  . terazosin  5 mg Oral QHS  . traMADol  50 mg Oral Q8H  . warfarin  4 mg Oral ONCE-1800  . DISCONTD: metoprolol tartrate  100 mg Oral BID   Continuous Infusions:   . dextrose 5 % and 0.45% NaCl     PRN Meds:.acetaminophen, acetaminophen, ALPRAZolam, alum & mag hydroxide-simeth, bisacodyl, bisacodyl, menthol-cetylpyridinium, methocarbamol(ROBAXIN) IV, methocarbamol, metoCLOPramide (REGLAN) injection, metoCLOPramide, ondansetron (ZOFRAN) IV, ondansetron, oxyCODONE, phenol, prochlorperazine, sodium phosphate, zolpidem, DISCONTD: metoprolol   1. ?  DVT - RULED OUT BY ULTRASOUND lE'S  2. Day #4, status post right total hip replacement, status post       relocation.  Per orthopedics, I have recommended to decrease       opiates to help with constipation.  Please see problem #3.    3. Constipation.  Continue meds-passed stool today   4.  AFib status post pacemaker on Coumadin. Patient was actually on metoprolol 100 twice a day which have had to change back to Toprol-XL given the fact that he had orthostatic hypotension today. Patient also does have some mild anemia I gave this up to discussion of surgeon to determine if the patient would benefit from transfusion. Certainly from a medical standpoint this does not have any specific cardiac disease other than a true fibrillation and therefore Peschel for transfusion would be below 7.   5. Hyperlipidemia.  The patient will continue on simvastatin 10 mg at       Bedtime.    6. CHF.  I am unsure as to what his actual EF is.  I did try to       contact Dr. Tereso Newcomer with regards to getting more pertinent       information. Will hold today's dose of Lasix given the fact that patient had above issue with orthostatic hypotension   7. Benign prostatic hypertrophy.  He will continue on his terazosin       for the same.    8. Hypotension.  This is moderately well controlled.  Orthostatics were done at bedside today with me in the room and he was slightly orthostatic therefore we will hold off on twice a day Toprol and just keep him on this.  Will hold his Benicar at present time   9. Pain.  We would discontinue his celecoxib given the fact that this       can worsen CHF and he was placed on low-dose tramadol for chronic       pain issues as well and will continue Robaxin that he is currently       on.    10.Hypothyroidism.  The patient will continue his levothyroxine. Follow  TSH has not been   11.Diet-controlled diabetes mellitus.  This will continued to be       controlled with insulin while acutely in the hospital.  He will       benefit from A1c and dietary modification as well as discussion       with primary care physician, whether he would benefit from       metformin as an outpatient.   His blood pressure stable and patient is clinically doing better tomorrow likely he can be  safe for discharge to a nursing facility at the discretion of Dr. Turner Daniels

## 2010-11-30 NOTE — Progress Notes (Signed)
ANTICOAGULATION CONSULT NOTE - Follow Up Consult  Pharmacy Consult for Coumadin  Indication: VTE prophylaxis  No Known Allergies  Patient Measurements: Height: 5\' 11"  (180.3 cm) (Entered for Cutover) Weight: 252 lb 3.3 oz (114.4 kg) (Entered for Cutover) IBW/kg (Calculated) : 75.3  Adjusted Body Weight:    Vital Signs: Temp: 98.8 F (37.1 C) (11/04 0522) Temp src: Oral (11/04 0522) BP: 104/70 mmHg (11/04 1034) Pulse Rate: 76  (11/04 1034)  Labs:  Basename 11/30/10 0710 11/29/10 0630 11/28/10 0555  HGB 8.7* -- 8.9*  HCT 26.9* -- 26.8*  PLT 300 -- 243  APTT -- -- --  LABPROT 27.5* 31.4* 30.2*  INR 2.51* 2.97* 2.83*  HEPARINUNFRC -- -- --  CREATININE 1.22 -- --  CKTOTAL -- -- --  CKMB -- -- --  TROPONINI -- -- --   Estimated Creatinine Clearance: 64.2 ml/min (by C-G formula based on Cr of 1.22).   Medications:  Scheduled:    . finasteride  5 mg Oral Daily  . furosemide  40 mg Oral Daily  . insulin aspart  0-15 Units Subcutaneous TID WC  . levothyroxine  75 mcg Oral QAC breakfast  . metolazone  2.5 mg Oral Q M,W,F-1800  . metoprolol tartrate  100 mg Oral BID  . olmesartan  10 mg Oral Daily  . senna  1 tablet Oral BID AC  . simvastatin  20 mg Oral q1800  . sorbitol  25 mL Oral BID  . terazosin  5 mg Oral QHS  . traMADol  50 mg Oral Q8H  . DISCONTD: furosemide  40 mg Intramuscular Daily  . DISCONTD: levothyroxine  38 mcg Intravenous QAC breakfast   Infustions:    . dextrose 5 % and 0.45% NaCl     PRN: acetaminophen, acetaminophen, ALPRAZolam, alum & mag hydroxide-simeth, bisacodyl, bisacodyl, menthol-cetylpyridinium, methocarbamol(ROBAXIN) IV, methocarbamol, metoCLOPramide (REGLAN) injection, metoCLOPramide, metoprolol, ondansetron (ZOFRAN) IV, ondansetron, oxyCODONE, phenol, prochlorperazine, sodium phosphate, zolpidem, DISCONTD: ALPRAZolam  Assessment: 77 yo F from nursing home on coumadin for VTE prophylaxis s/p R THA 10/26. (Initial consult for coumadin  11/1). INR today 2.51 (goal 2-3) after being held x 2 days for rising INR. PTA coumadin dose was 2mg  MWTS and 4mg  other days. No bleeding noted. CBC stable.   Goal of Therapy:  INR 2-3   Plan:  1. Coumadin 4mg  po today at 1800 2. F/U AM  INR   Thank you!   Bufford Buttner, Sandralee Tarkington N 11/30/2010,10:40 AM

## 2010-11-30 NOTE — Progress Notes (Signed)
  PATIENT IDLeonides [name]  MRN: [id number]  DOB/AGE:  May 05, 1930 / 77 y.o.         PROGRESS NOTE Subjective: negative for Chest Pain, negative for Shortness of Breath, negative for Nausea/Vomiting, negative for Calf Pain, positive for Bowel Movement, Tolerating Diet: yes,Patient reports pain as 1 on 0-10 scale  .    Objective: Vital signs in last 24 hours: Temp:  [98.8 F (37.1 C)] 98.8 F (37.1 C) (11/04 0522) Pulse Rate:  [69] 69  (11/04 0522) Resp:  [18] 18  (11/04 0522) BP: (97)/(64) 97/64 mmHg (11/04 0522) SpO2:  [96 %] 96 % (11/04 0522)    Intake/Output from previous day: I/O last 3 completed shifts: In: 1240 [P.O.:1240] Out: 250 [Urine:250]   Intake/Output this shift: Total I/O In: -  Out: 350 [Urine:350]   LABORATORY DATA: WBC  Date/Time Value Range Status  11/28/2010  5:55 AM 8.9  4.0-10.5 (K/uL) Final     Hemoglobin  Date/Time Value Range Status  11/28/2010  5:55 AM 8.9* 13.0-17.0 (g/dL) Final     HCT  Date/Time Value Range Status  11/28/2010  5:55 AM 26.8* 39.0-52.0 (%) Final     Platelets  Date/Time Value Range Status  11/28/2010  5:55 AM 243  150-400 (K/uL) Final    Sodium  Date/Time Value Range Status  11/27/2010 10:12 AM 138  135-145 (mEq/L) Final     Potassium  Date/Time Value Range Status  11/27/2010 10:12 AM 3.6  3.5-5.1 (mEq/L) Final     Chloride  Date/Time Value Range Status  11/27/2010 10:12 AM 105  96-112 (mEq/L) Final     CO2  Date/Time Value Range Status  11/27/2010 10:12 AM 25  19-32 (mEq/L) Final     BUN  Date/Time Value Range Status  11/27/2010 10:12 AM 10  6-23 (mg/dL) Final     Creatinine, Ser  Date/Time Value Range Status  11/27/2010 10:12 AM 1.04  0.50-1.35 (mg/dL) Final     Glucose, Bld  Date/Time Value Range Status  11/27/2010 10:12 AM 143* 70-99 (mg/dL) Final     Calcium  Date/Time Value Range Status  11/27/2010 10:12 AM 8.6  8.4-10.5 (mg/dL) Final   Lab Results  Component Value Date   INR 2.97* 11/29/2010     INR 2.83* 11/28/2010   INR 2.60* 11/27/2010    Examination: Neurologically intact ABD soft Neurovascular intact Sensation intact distally Intact pulses distally Dorsiflexion/Plantar flexion intact Incision: no drainage} abd pillow in place  Assessment:      s/p postop THA DL ADDITIONAL DIAGNOSIS:  Diabetes, HTN, possible CHF  Plan: Physical Therapy as ordered Weight Bearing as Tolerated (WBAT)  DVT Prophylaxis:  Coumadin  DISCHARGE PLAN: Skilled Nursing Facility/Rehab  DISCHARGE NEEDS: Walker and 3-in-1 comode seat     Trevion Hoben J 11/30/2010, 7:07 AM

## 2010-12-01 LAB — GLUCOSE, CAPILLARY
Glucose-Capillary: 135 mg/dL — ABNORMAL HIGH (ref 70–99)
Glucose-Capillary: 126 mg/dL — ABNORMAL HIGH (ref 70–99)

## 2010-12-01 LAB — PROTIME-INR
INR: 2.27 — ABNORMAL HIGH (ref 0.00–1.49)
Prothrombin Time: 25.4 seconds — ABNORMAL HIGH (ref 11.6–15.2)

## 2010-12-01 MED ORDER — HYDROCODONE-ACETAMINOPHEN 5-500 MG PO TABS: 1.0000 | ORAL_TABLET | ORAL | Status: DC | PRN

## 2010-12-01 MED ORDER — WARFARIN SODIUM 5 MG PO TABS
5.0000 mg | ORAL_TABLET | Freq: Once | ORAL | Status: AC
  Administered 2010-12-01: 5 mg via ORAL
  Filled 2010-12-01: qty 1

## 2010-12-01 NOTE — Progress Notes (Signed)
  Evaluated for possible admission.  Spoke with wife.  Patient and wife prefer inpatient rehab.  Called insurance carrier and spoke with case Production designer, theatre[employer]tele[employer].  Patient not approved for acute inpatient rehab.  Options will be home with home health or skilled nursing facility.

## 2010-12-01 NOTE — Progress Notes (Signed)
ANTICOAGULATION CONSULT NOTE - Follow Up Consult  Pharmacy Consult for Coumadin Indication: VTE px s/p R THA  No Known Allergies  Patient Measurements: Height: 5\' 11"  (180.3 cm) (Entered for Cutover) Weight: 252 lb 3.3 oz (114.4 kg) (Entered for Cutover) IBW/kg (Calculated) : 75.3    Vital Signs: Temp: 98.5 F (36.9 C) (11/05 1423) Temp src: Oral (11/05 1423) BP: 109/70 mmHg (11/05 1423) Pulse Rate: 75  (11/05 1423)  Labs:  Basename 12/01/10 0755 11/30/10 0710 11/29/10 0630  HGB -- 8.7* --  HCT -- 26.9* --  PLT -- 300 --  APTT -- -- --  LABPROT 25.4* 27.5* 31.4*  INR 2.27* 2.51* 2.97*  HEPARINUNFRC -- -- --  CREATININE -- 1.22 --  CKTOTAL -- -- --  CKMB -- -- --  TROPONINI -- -- --   Estimated Creatinine Clearance: 64.2 ml/min (by C-G formula based on Cr of 1.22).  Assessment: 80yom continues on coumadin for VTE px s/p R THA. INR is therapeutic at 2.27 but continues to trend down despite restarting coumadin yesterday. Will give an increased dose today.  Goal of Therapy:  INR 2-3   Plan:  Coumadin 5mg  po x 1, follow up INR in am  Fredrik Rigger 12/01/2010,3:15 PM

## 2010-12-01 NOTE — Op Note (Signed)
  [name], [name]             ACCOUNT NO.:  [id number]  MEDICAL RECORD NO.:  [id number]  LOCATION:  5032                         FACILITY:  [facility]  PHYSICIAN:  Feliberto Gottron. Turner Daniels, M.D.   DATE OF BIRTH:  05/29/1930  DATE OF PROCEDURE:  11/26/2010 DATE OF DISCHARGE:                              OPERATIVE REPORT   PREOPERATIVE DIAGNOSIS:  Dislocation of right total hip.  POSTOPERATIVE DIAGNOSIS:  Dislocation of right total hip.  PROCEDURE:  Closed reduction of right total hip under general anesthesia.  SURGEON:  Feliberto Gottron. Turner Daniels, MD.  FIRST ASSISTANT:  Shirl Harris, [facility].  ANESTHETIC:  General endotracheal.  ESTIMATED BLOOD LOSS:  Minimal.  FLUID REPLACEMENT:  500 mL crystalloid.  DRAINS PLACED:  None.  INDICATIONS FOR PROCEDURE:  This 77 year old man had right total hip arthroplasty 5 days ago, was transferred to a nursing home rehab center yesterday, and at some point, dislocated his right total hip despite repetitive questioning of both the patient and his family.  I was unable to determine if the hip popped out during transfer yesterday when he was at the nursing home or today.  He said he has had pain in the hip that increased markedly after he was transferred out of [facility].  In any event, an x-ray was done at the nursing home.  The hip was dislocated straight superior, the angles of the cup and the femoral component looked to be good.  The abduction angle of the cup was about 45 degrees on the plain x-rays.  The anteversion combined looked to be about 35-40 degrees on immediate postoperative film.  Risks and benefits of closed reduction, possible open reduction were discussed with the patient's family, and he was taken for intervention.  DESCRIPTION OF PROCEDURE:  The patient identified by armband and taken to operating room #1 where the appropriate anesthetic monitors were attached, and general endotracheal anesthesia induced with the patient in supine  position.  He was given succinylcholine for full relaxation, and while my assistant applied firm pressure to the anterior iliac spine, I climbed on the table and applied traction with internal and external rotation, and the hip reduced.  C-arm imaging was used to confirm reduction of the hip.  At this point, we placed the patient on an abduction pillow.  He was awakened and extubated.  The reduction itself required a moderate amount of force.     Feliberto Gottron. Turner Daniels, M.D.     [name]  D:  11/26/2010  T:  11/27/2010  Job:  [id number]  Electronically Signed by Gean Birchwood M.D. on 12/01/2010 10:36:30 PM

## 2010-12-01 NOTE — Plan of Care (Signed)
Problem: Phase I Progression Outcomes Goal: Other Phase I Outcomes/Goals Outcome: Progressing Pt fatigues and requires rest break. Pt with nausea with mobility and ADL tasks with prolonged time to complete task due to nausea. Pt eager to participate and pleasant.

## 2010-12-01 NOTE — Progress Notes (Signed)
Physical Therapy Treatment Patient Details Name: [name] MRN: [id number] DOB: June 28, 1930 Today's Date: 12/01/2010  PT Assessment/Plan  PT - Assessment/Plan Comments on Treatment Session: progressing well .  Pt. pleased with his progress.  Continues to need inpatient rehab following acute stay to maximize mobility for eventula return hom. PT Plan: Discharge plan remains appropriate PT Frequency: 7X/week Follow Up Recommendations: Inpatient Rehab Equipment Recommended: Rolling walker with 5" wheels PT Goals  Acute Rehab PT Goals PT Goal Formulation: With patient Pt will go Supine/Side to Sit: with min assist Pt will go Sit to Supine/Side: with min assist Pt will Transfer Sit to Stand/Stand to Sit: with min assist PT Transfer Goal: Sit to Stand/Stand to Sit - Progress: Progressing toward goal Pt will Transfer Bed to Chair/Chair to Bed: with min assist PT Transfer Goal: Bed to Chair/Chair to Bed - Progress: Progressing toward goal Pt will Ambulate: 51 - 150 feet;with min assist;with rolling walker PT Goal: Ambulate - Progress: Progressing toward goal Additional Goals Additional Goal #1: pt. will verbalize 3/3 hip precautions and demonstrate consistent compliance PT Goal: Additional Goal #1 - Progress: Progressing toward goal  PT Treatment Precautions/Restrictions  Precautions Precautions: Posterior Hip Precaution Booklet Issued: No Precaution Comments: Pt has posterior hip precaution booklet Required Braces or Orthoses: No Restrictions Weight Bearing Restrictions: No RLE Weight Bearing: Weight bearing as tolerated Other Position/Activity Restrictions: WBAT on RLE Mobility (including Balance) Bed Mobility Bed Mobility: No (pt. already up in recliner) Transfers Transfers: Yes Sit to Stand: 3: Mod assist Sit to Stand Details (indicate cue type and reason): vc's for hand and right LE placement Stand to Sit: 3: Mod assist Stand to Sit Details: to control  descent Ambulation/Gait Ambulation/Gait: Yes Ambulation/Gait Assistance: 4: Min assist Ambulation/Gait Assistance Details (indicate cue type and reason): extra person to follow with chair for safety Ambulation Distance (Feet): 27 Feet Assistive device: Rolling walker Gait Pattern: Trunk flexed (vc's for upright stance)    Exercise  Total Joint Exercises Ankle Circles/Pumps: AROM;15 reps Quad Sets: AROM;Right;15 reps End of Session PT - End of Session Equipment Utilized During Treatment: Gait belt;Other (comment) (rolling walker) Activity Tolerance: Patient limited by fatigue Patient left: in chair;with family/visitor present General Behavior During Session: [facility] for tasks performed Cognition: [facility] for tasks performed  Ferman Hamming 12/01/2010, 12:25 PM

## 2010-12-01 NOTE — Plan of Care (Signed)
Problem: Discharge Progression Outcomes Goal: Activity appropriate for discharge plan Outcome: Progressing At appropriate activity level for next venue of care

## 2010-12-01 NOTE — Progress Notes (Signed)
Occupational Therapy Evaluation Patient Details Name: [name] MRN: [id number] DOB: 1930/11/13 Today's Date: 12/01/2010  Problem List:  Patient Active Problem List  Diagnoses  . Hip dislocation, right  . HTN (hypertension)    Past Medical History: No past medical history on file. Past Surgical History: No past surgical history on file.  OT Assessment/Plan/Recommendation OT Assessment Clinical Impression Statement: decreased balance, decreased activity tolerance, decreased basic transfers OT Recommendation/Assessment: Patient will need skilled OT in the acute care venue OT Problem List: Decreased strength;Decreased activity tolerance;Impaired balance (sitting and/or standing);Decreased knowledge of use of DME or AE;Decreased knowledge of precautions;Pain;Other (comment) (pt with +4-5 pitting edema in RLE at the foot) OT Therapy Diagnosis : Generalized weakness;Acute pain OT Plan OT Frequency: Min 2X/week OT Treatment/Interventions: Self-care/ADL training;DME and/or AE instruction;Therapeutic activities;Patient/family education;Balance training OT Recommendation Recommendations for Other Services: Rehab consult (wife available to A 24/7 after CIR d/c) Follow Up Recommendations: Inpatient Rehab Equipment Recommended: Defer to next venue Individuals Consulted Consulted and Agree with Results and Recommendations: Family member/caregiver;Patient Family Member Consulted: wife and dgter (dgter left after first 10 minutes to go to work) OT Goals Acute Rehab OT Goals OT Goal Formulation: With patient/family Time For Goal Achievement: 2 weeks ADL Goals Pt Will Perform Grooming: with supervision;Standing at sink Pt Will Perform Lower Body Bathing: with min assist;Sit to stand from chair Pt Will Perform Lower Body Dressing: with min assist;Sit to stand from chair Pt Will Transfer to Toilet: with min assist;Maintaining hip precautions;Stand pivot transfer;Extra wide 3-in-1 (bariatric  3N1) Pt Will Perform Toileting - Clothing Manipulation: with min assist;Standing Pt Will Perform Toileting - Hygiene: with min assist;Sit to stand from 3-in-1/toilet  OT Evaluation Precautions/Restrictions  Precautions Precautions: Posterior Hip Precaution Booklet Issued: No Precaution Comments: Pt has posterior hip precaution booklet Required Braces or Orthoses: No Restrictions Weight Bearing Restrictions: No Other Position/Activity Restrictions: WBAT on RLE Prior Functioning Home Living Lives With: Spouse Receives Help From: Family Type of Home: House Home Layout: Two level Alternate Level Stairs-Rails: None Home Access: Stairs to enter Entrance Stairs-Rails: None Entrance Stairs-Number of Steps: 2 Bathroom Shower/Tub: Health visitor: Standard Bathroom Accessibility: No Home Adaptive Equipment: Hand-held shower hose;Hospital bed;Bedside commode/3-in-1;Walker - standard (lift chair) Prior Function Level of Independence: Independent with basic ADLs;Independent with homemaking with ambulation;Independent with homemaking with wheelchair;Independent with gait;Independent with transfers Able to Take Stairs?: Yes Driving: Yes Vocation: Retired ADL ADL Eating/Feeding: Performed;Independent Where Assessed - Eating/Feeding: Bed level (bedside tray positioned over the patient) Grooming: Performed;Wash/dry face (comb hair) Where Assessed - Grooming: Sitting, chair Upper Body Bathing: Simulated;Set up Where Assessed - Upper Body Bathing: Sitting, chair Lower Body Bathing: Simulated;Maximal assistance Where Assessed - Lower Body Bathing: Sitting, chair Upper Body Dressing: Set up Where Assessed - Upper Body Dressing: Sitting, chair Lower Body Dressing: +1 Total assistance Where Assessed - Lower Body Dressing: Sit to stand from chair (THP limit LB ADls and could benefit from AE education) Toilet Transfer: Performed;Maximal assistance (Mod A to descend uncontrolled  due to fatigue) Toilet Transfer Details (indicate cue type and reason): BIL UE used for sit<>stand Toilet Transfer Method: Stand pivot Acupuncturist: Raised toilet seat with arms (or 3-in-1 over toilet);Grab bars Toileting - Clothing Manipulation: Performed;+1 Total assistance Toileting - Clothing Manipulation Details (indicate cue type and reason): max v/c to extended RLE Where Assessed - Glass blower/designer Manipulation: Standing Toileting - Hygiene: Performed;Maximal assistance Toileting - Hygiene Details (indicate cue type and reason): pt requires extended time to void. pt unaware of void  in brief (pt states "its hard to tell if its gas or stool") Where Assessed - Toileting Hygiene: Sit to stand from 3-in-1 or toilet Tub/Shower Transfer: Not assessed Tub/Shower Transfer Method: Not assessed Equipment Used: Rolling walker (gait belt) ADL Comments: Fatigued s/p ambulation to restroom and hygiene standing (requires rest break and became nauseated cool wash cloth ) Vision/Perception  Vision - History Baseline Vision: Wears glasses all the time Vision - Assessment Eye Alignment: Within Functional Limits Cognition Cognition Arousal/Alertness: Awake/alert Sensation/Coordination Coordination Gross Motor Movements are Fluid and Coordinated: Yes Fine Motor Movements are Fluid and Coordinated: Yes Extremity Assessment RUE Assessment RUE Assessment: Within Functional Limits LUE Assessment LUE Assessment: Within Functional Limits Mobility  Bed Mobility Bed Mobility: Yes Rolling Right: 1: +2 Total assist;With rail (70) Right Sidelying to Sit: 1: +2 Total assist;With rails;HOB elevated (comment degrees) (70; HOB 51 degrees) Transfers Transfers: Yes Sit to Stand: 1: +2 Total assist;With upper extremity assist;From bed (50) Sit to Stand Details (indicate cue type and reason): min v/c for RLE extended out Exercises   End of Session OT - End of Session Equipment Utilized  During Treatment: Gait belt (RW) Activity Tolerance: Patient tolerated treatment well Patient left: in chair Nurse Communication: Mobility status for ambulation;Mobility status for transfers General Behavior During Session: [facility] for tasks performed   Lucile Shutters MS OTR/L 12/01/2010, 9:28 AM

## 2010-12-01 NOTE — Discharge Summary (Signed)
PATIENT IDMalakai [name]  MRN:     [id number] DOB/AGE:    Mar 24, 1930 / 77 y.o.     DISCHARGE SUMMARY  ADMISSION DATE:    11/26/2010 DISCHARGE DATE:    ADMISSION DIAGNOSIS: * No surgery found * No past medical history on file.  DISCHARGE DIAGNOSIS:   Principal Problem:  *Hip dislocation, right Active Problems:  HTN (hypertension)   PROCEDURE:  Closed Reduction Right Total Hip  CONSULTS: Treatment Team:  [facility] COURSE:  [name] is a 77 y.o. admitted on 11/26/2010 with a chief complaint of hip pain, and found to have a diagnosis of hip dislocation.  They were brought to the operating room on 11/26/10.  They were given perioperative antibiotics: Anti-infectives    None    .  Physical Therapy as ordered Weight Bearing as Tolerated (WBAT)  DVT Prophylaxis:  Coumadin   The remainder of the hospital course was unremarkable and patient discharged in stable condition.   DIAGNOSTIC STUDIES:  RECENT LABORATORY STUDIES:   No results found.  RECENT RADIOGRAPHIC STUDIES :  Dg Chest 2 View  11/11/2010  *RADIOLOGY REPORT*  Clinical Data: Right hip replacement.  Preop radiograph.  CHEST - 2 VIEW  Comparison: None  Findings: There is a left chest wall pacer device with leads in the right atrial appendage and right ventricle.  Heart size is mildly enlarged.  No pleural effusion or pulmonary interstitial edema identified.  Advanced spondylosis within the thoracic spine.  IMPRESSION:  1.  No active cardiopulmonary abnormalities.  Original Report Authenticated By: Rosealee Albee, M.D.   Dg Hip 1 View Right  11/26/2010  *RADIOLOGY REPORT*  Clinical Data: Closed reduction film for hip dislocation.  RIGHT HIP - 1 VIEW  Comparison: 11/26/2010 at 2127 hours  Findings: A single spot fluoroscopic view of the right hip is obtained, demonstrating relocation of the right hip joint prosthesis.  IMPRESSION: Spot fluoroscopic views of the right hip demonstrates  relocation of the right hip joint prosthesis.  Original Report Authenticated By: Marlon Pel, M.D.   Dg Hip Complete Right  11/26/2010  *RADIOLOGY REPORT*  Clinical Data: Right hip pain  RIGHT HIP - COMPLETE 2+ VIEW  Comparison: 11/21/2010  Findings: Superolateral dislocation of the right hip femoral prosthesis from the acetabular component.  Somewhat steep acetabular angle.  No acute fractures.  IMPRESSION: Superolateral dislocation of the right hip prosthesis.  Original Report Authenticated By: Marlon Pel, M.D.   Dg Pelvis Portable  11/21/2010  *RADIOLOGY REPORT*  Clinical Data: Right hip replacement.  PORTABLE PELVIS,PORTABLE RIGHT HIP - 1 VIEW  Comparison: None.  Findings: Total right hip replacement appears in satisfactory position.  Left hip joint degenerative changes.  IMPRESSION: Right total hip replacement appears in satisfactory position.  Original Report Authenticated By: Fuller Canada, M.D.   Dg Hip Portable 1 View Right  11/21/2010  *RADIOLOGY REPORT*  Clinical Data: Right hip replacement.  PORTABLE PELVIS,PORTABLE RIGHT HIP - 1 VIEW  Comparison: None.  Findings: Total right hip replacement appears in satisfactory position.  Left hip joint degenerative changes.  IMPRESSION: Right total hip replacement appears in satisfactory position.  Original Report Authenticated By: Fuller Canada, M.D.   Dg Abd Acute W/chest  11/27/2010  *RADIOLOGY REPORT*  Clinical Data: Abdominal pain, constipation, question ileus  ACUTE ABDOMEN SERIES (ABDOMEN 2 VIEW & CHEST 1 VIEW)  Comparison: Chest radiograph 11/11/2010  Findings: Left subclavian sequential transvenous pacemaker leads project  at right atrium and right ventricle. Enlargement of cardiac silhouette. Tortuous aorta. Pulmonary vascularity normal. Lungs clear. Multilevel degenerative disc disease changes thoracolumbar spine with disc space narrowing and endplate spur formation. Osseous demineralization. Right hip prosthesis.  Nonobstructive bowel gas pattern. No bowel dilatation, bowel wall thickening, or free intraperitoneal air. No definite urinary tract calcification.  IMPRESSION: Nonobstructive bowel gas pattern. Mild enlargement of cardiac silhouette post pacemaker. No definite acute abnormalities.  Original Report Authenticated By: Lollie Marrow, M.D.   Dg C-arm 1-60 Min  11/27/2010  CLINICAL DATA: dislocation   C-ARM 1-60 MINUTES  Fluoroscopy was utilized by the requesting physician.  No radiographic  interpretation.      RECENT VITAL SIGNS:  Patient Vitals for the past 24 hrs:  BP Temp Temp src Pulse Resp SpO2  12/01/10 0543 122/76 mmHg 97.9 F (36.6 C) Oral 74  20  91 %  11/30/10 2055 129/73 mmHg 98.3 F (36.8 C) Oral 67  18  99 %  11/30/10 1445 82/50 mmHg 98.7 F (37.1 C) Oral 70  18  94 %  11/30/10 1034 104/70 mmHg - - 76  - -  .  RECENT EKG RESULTS:    Orders placed during the hospital encounter of 11/11/10  . EKG    DISCHARGE INSTRUCTIONS:  Discharge Orders    Future Orders Please Complete By Expires   Diet - low sodium heart healthy      Increase activity slowly         DISCHARGE MEDICATIONS:   Current Discharge Medication List    CONTINUE these medications which have CHANGED   Details  HYDROcodone-acetaminophen (VICODIN) 5-500 MG per tablet Take 1 tablet by mouth every 4 (four) hours as needed for pain. Qty: 30 tablet, Refills: 0      CONTINUE these medications which have NOT CHANGED   Details  ALPRAZolam (XANAX) 0.5 MG tablet Take 0.5 mg by mouth daily as needed. For anxiety    celecoxib (CELEBREX) 100 MG capsule Take 100 mg by mouth daily.      finasteride (PROSCAR) 5 MG tablet Take 5 mg by mouth daily.      furosemide (LASIX) 40 MG tablet Take 40 mg by mouth daily.      levothyroxine (SYNTHROID, LEVOTHROID) 75 MCG tablet Take 75 mcg by mouth daily.      lovastatin (MEVACOR) 20 MG tablet Take 40 mg by mouth at bedtime.      methocarbamol (ROBAXIN) 500 MG tablet Take  500 mg by mouth every 6 (six) hours as needed. For muscle spasms     metolazone (ZAROXOLYN) 2.5 MG tablet Take 2.5 mg by mouth every Monday, Wednesday, and Friday. As needed for fluid retention.    metoprolol (LOPRESSOR) 100 MG tablet Take 100 mg by mouth 2 (two) times daily.      olmesartan (BENICAR) 20 MG tablet Take 10 mg by mouth daily.      !! OVER THE COUNTER MEDICATION Take 2 tablets by mouth daily. Medication=Flexerine OTC Joint Tablets     !! OVER THE COUNTER MEDICATION Take 1 tablet by mouth daily. Medication=Prostat Revive     Pollen Extracts (PROSTAT PO) Take 1 tablet by mouth daily.      terazosin (HYTRIN) 5 MG capsule Take 5 mg by mouth daily.      warfarin (COUMADIN) 4 MG tablet Take 2-4 mg by mouth daily. Patient takes 2mg  on Mon, Wed, Thurs, and Sat. Patient takes 4mg  on Tues, Fri, and Sun.     !! -  Potential duplicate medications found. Please discuss with provider.    STOP taking these medications     oxyCODONE-acetaminophen (PERCOCET) 5-325 MG per tablet         FOLLOW UP VISIT:   Follow-up Information    Follow up with ROWAN,FRANK J in 7 days.   Contact information:   [facility] [address] [place] [address] [phone]          DISCHARGE BJ:[id number] Nursing Facility/Rehab IN Stable Condition  DISPOSITION: Skilled Nursing Facility  DISCHARGE CONDITION:  Stable   Jaquana Geiger M. 12/01/2010, 8:40 AM

## 2010-12-01 NOTE — Progress Notes (Signed)
PATIENT IDVladislav [name]  MRN: [id number]  DOB/AGE:  30-Oct-1930 / 77 y.o.         PROGRESS NOTE Subjective: negative for Chest Pain, negative for Shortness of Breath, negative for Nausea/Vomiting, negative for Calf Pain, positive for Bowel Movement, Tolerating Diet: yes, Incentive Spirometry: Yes, CPM: No Patient reports pain as mild  .    Objective: Vital signs in last 24 hours: Temp:  [97.9 F (36.6 C)-98.7 F (37.1 C)] 97.9 F (36.6 C) (11/05 0543) Pulse Rate:  [67-76] 74  (11/05 0543) Resp:  [18-20] 20  (11/05 0543) BP: (82-129)/(50-76) 122/76 mmHg (11/05 0543) SpO2:  [91 %-99 %] 91 % (11/05 0543)    Intake/Output from previous day: I/O last 3 completed shifts: In: 1730 [P.O.:1730] Out: 950 [Urine:950]   Intake/Output this shift:     LABORATORY DATA:  Basename 11/30/10 0710 11/29/10 0630  WBC 8.5 --  HGB 8.7* --  HCT 26.9* --  PLT 300 --  NA 138 --  K 3.3* --  CL 99 --  CO2 31 --  BUN 10 --  CREATININE 1.22 --  GLUCOSE 104* --  INR 2.51* 2.97*  CALCIUM 9.0 --    Examination: Neurologically intact}  Leg lengths equal.  Incision benign.  LE edema improved.  Abdomen soft and nontender.  Assessment:    s/p Closed Reduction right THA dislocation   ADDITIONAL DIAGNOSIS:  Acute Blood Loss Anemia  Plan: Physical Therapy as ordered Weight Bearing as Tolerated (WBAT)  DVT Prophylaxis:  Coumadin  DISCHARGE PLAN: Skilled Nursing Facility/Rehab  DISCHARGE NEEDS: 3-in-1 comode seat, walker     [name]. 12/01/2010, 8:22 AM

## 2010-12-01 NOTE — Progress Notes (Signed)
Patient seen today by myself and is doing great notes reviewed and noted the patient's post discharge home tomorrow. Noted that Medn rec done and  Suggestions regards to discharge medications as patient has significant cardiac and hypertension/hypotensive issues  1) discontinue the Hytrin-can cause hypotension 2) continue low-dose metoprolol 12.5 in the morning. Patient will need this for A. fib control.  If patient's blood pressure is below 100/60 would recommend 0.125 mg of digoxin orally every day-patient to have followup closely at the nursing facility with regards to blood pressure.  #3) would recommend discontinuation of Benicar completely #4) would recommend also discontinuing Robaxin.   Thank you for allowing me to participate in this patient's care. Page me directly with questions, I am signing off today [facility]

## 2010-12-02 LAB — PROTIME-INR: INR: 2.36 — ABNORMAL HIGH (ref 0.00–1.49)

## 2010-12-02 LAB — GLUCOSE, CAPILLARY
Glucose-Capillary: 120 mg/dL — ABNORMAL HIGH (ref 70–99)
Glucose-Capillary: 144 mg/dL — ABNORMAL HIGH (ref 70–99)

## 2010-12-02 MED ORDER — METOPROLOL SUCCINATE 12.5 MG HALF TABLET: 12.5000 mg | ORAL_TABLET | Freq: Every day | ORAL | Status: DC

## 2010-12-02 MED ORDER — WARFARIN SODIUM 4 MG PO TABS
4.0000 mg | ORAL_TABLET | Freq: Once | ORAL | Status: DC
  Filled 2010-12-02: qty 1

## 2010-12-02 NOTE — Progress Notes (Signed)
Patient transferred to SNF bed at [facility] today via EMS. Pt. Agreeable to these plans.  Reece Levy, MSW, Theresia Majors [phone]

## 2010-12-02 NOTE — Progress Notes (Signed)
Discharge  Instructions reviewed and understood

## 2010-12-02 NOTE — Progress Notes (Signed)
ANTICOAGULATION CONSULT NOTE - Follow Up Consult  Pharmacy Consult for Coumadin Indication: VTE px s/p R THA  No Known Allergies  Patient Measurements: Height: 5\' 11"  (180.3 cm) (Entered for Cutover) Weight: 252 lb 3.3 oz (114.4 kg) (Entered for Cutover) IBW/kg (Calculated) : 75.3    Vital Signs: Temp: 97.8 F (36.6 C) (11/06 1231) Temp src: Oral (11/06 1231) BP: 129/71 mmHg (11/06 1231) Pulse Rate: 98  (11/06 1231)  Labs:  Basename 12/02/10 0636 12/01/10 0755 11/30/10 0710  HGB -- -- 8.7*  HCT -- -- 26.9*  PLT -- -- 300  APTT -- -- --  LABPROT 26.2* 25.4* 27.5*  INR 2.36* 2.27* 2.51*  HEPARINUNFRC -- -- --  CREATININE -- -- 1.22  CKTOTAL -- -- --  CKMB -- -- --  TROPONINI -- -- --   Estimated Creatinine Clearance: 64.2 ml/min (by C-G formula based on Cr of 1.22).  Assessment: 80yom continues on coumadin for VTE px s/p R THA. INR is therapeutic today. Last CBC WNL on 11/4. Patient on Coumadin 2mg  MWTS and 4mg  other days PTA, but admit INR was below goal.  Goal of Therapy:  INR 2-3   Plan:  Coumadin 4mg  po x 1, follow up INR in am. If AM INR stable, will attempt 3.5mg  daily as maintenance dose.  Mirna Mires Krishnavadan 12/02/2010,1:40 PM

## 2010-12-02 NOTE — Progress Notes (Signed)
PATIENT IDVerner [name]  MRN: [id number]  DOB/AGE:  [birth year] / 77 y.o.         PROGRESS NOTE Subjective: Patient is alert, oriented, yes Nausea, no Vomiting, no passing gas, yes Bowel Movement. Taking PO well. Denies SOB, Chest or Calf Pain. Using Incentive Spirometer, PAS in place. Ambulating with walker. Patient reports pain as mild  .    Objective: Vital signs in last 24 hours: Temp:  [97.4 F (36.3 C)-98.8 F (37.1 C)] 98.5 F (36.9 C) (11/06 0650) Pulse Rate:  [75-95] 95  (11/06 0650) Resp:  [16-20] 16  (11/06 0650) BP: (109-157)/(55-76) 157/73 mmHg (11/06 0650) SpO2:  [97 %-99 %] 97 % (11/06 0650)    Intake/Output from previous day: I/O last 3 completed shifts: In: 720 [P.O.:720] Out: 1350 [Urine:1350]   Intake/Output this shift:     LABORATORY DATA:  Basename 12/02/10 0636 12/01/10 0755 11/30/10 0710  WBC -- -- 8.5  HGB -- -- 8.7*  HCT -- -- 26.9*  PLT -- -- 300  NA -- -- 138  K -- -- 3.3*  CL -- -- 99  CO2 -- -- 31  BUN -- -- 10  CREATININE -- -- 1.22  GLUCOSE -- -- 104*  INR 2.36* 2.27* --  CALCIUM -- -- 9.0    Examination: Neurologically intact ABD soft Neurovascular intact Sensation intact distally Intact pulses distally Incision: dressing C/D/I} XR AP&Lat of hip shows well placed\fixed THA  Assessment:       ADDITIONAL DIAGNOSIS:  Acute Blood Loss Anemia  Plan: PT/OT WBAT, THA  posterior precautions  DVT Prophylaxis: Lovenox\Coumadin bridge, monitor INR 1.5-2,5 target  DISCHARGE PLAN: Skilled Nursing Facility/Rehab  DISCHARGE NEEDS: Walker and 3-in-1 comode seat

## 2011-12-23 ENCOUNTER — Encounter (HOSPITAL_COMMUNITY): Payer: Self-pay | Admitting: *Deleted

## 2011-12-23 ENCOUNTER — Emergency Department (HOSPITAL_COMMUNITY)
Admission: EM | Admit: 2011-12-23 | Discharge: 2011-12-23 | Disposition: A | Discharge status: Home / Self Care | Payer: Medicare Other | Attending: Emergency Medicine | Admitting: Emergency Medicine

## 2011-12-23 DIAGNOSIS — Z8679 Personal history of other diseases of the circulatory system: Secondary | ICD-10-CM | POA: Insufficient documentation

## 2011-12-23 DIAGNOSIS — Z79899 Other long term (current) drug therapy: Secondary | ICD-10-CM | POA: Insufficient documentation

## 2011-12-23 DIAGNOSIS — R5383 Other fatigue: Secondary | ICD-10-CM | POA: Insufficient documentation

## 2011-12-23 DIAGNOSIS — Z7901 Long term (current) use of anticoagulants: Secondary | ICD-10-CM | POA: Insufficient documentation

## 2011-12-23 DIAGNOSIS — R5381 Other malaise: Secondary | ICD-10-CM | POA: Insufficient documentation

## 2011-12-23 DIAGNOSIS — N289 Disorder of kidney and ureter, unspecified: Secondary | ICD-10-CM

## 2011-12-23 DIAGNOSIS — R55 Syncope and collapse: Secondary | ICD-10-CM | POA: Insufficient documentation

## 2011-12-23 DIAGNOSIS — M549 Dorsalgia, unspecified: Secondary | ICD-10-CM | POA: Insufficient documentation

## 2011-12-23 DIAGNOSIS — R42 Dizziness and giddiness: Secondary | ICD-10-CM | POA: Insufficient documentation

## 2011-12-23 DIAGNOSIS — Z950 Presence of cardiac pacemaker: Secondary | ICD-10-CM | POA: Insufficient documentation

## 2011-12-23 LAB — URINALYSIS, ROUTINE W REFLEX MICROSCOPIC
Glucose, UA: NEGATIVE mg/dL
pH: 7 (ref 5.0–8.0)

## 2011-12-23 LAB — APTT: aPTT: 35 seconds (ref 24–37)

## 2011-12-23 LAB — CBC WITH DIFFERENTIAL/PLATELET
Basophils Relative: 0 % (ref 0–1)
Eosinophils Absolute: 0 10*3/uL (ref 0.0–0.7)
Hemoglobin: 11.6 g/dL — ABNORMAL LOW (ref 13.0–17.0)
MCH: 32.2 pg (ref 26.0–34.0)
MCHC: 33.8 g/dL (ref 30.0–36.0)
Monocytes Relative: 14 % — ABNORMAL HIGH (ref 3–12)
Neutrophils Relative %: 72 % (ref 43–77)
RDW: 12.8 % (ref 11.5–15.5)

## 2011-12-23 LAB — COMPREHENSIVE METABOLIC PANEL
Albumin: 3.6 g/dL (ref 3.5–5.2)
BUN: 54 mg/dL — ABNORMAL HIGH (ref 6–23)
Creatinine, Ser: 1.94 mg/dL — ABNORMAL HIGH (ref 0.50–1.35)
Potassium: 3.3 mEq/L — ABNORMAL LOW (ref 3.5–5.1)
Total Protein: 6.9 g/dL (ref 6.0–8.3)

## 2011-12-23 LAB — PROTIME-INR: Prothrombin Time: 23.7 seconds — ABNORMAL HIGH (ref 11.6–15.2)

## 2011-12-23 LAB — URINE MICROSCOPIC-ADD ON

## 2011-12-23 MED ORDER — SODIUM CHLORIDE 0.9 % IV BOLUS (SEPSIS)
500.0000 mL | Freq: Once | INTRAVENOUS | Status: AC
  Administered 2011-12-23: 500 mL via INTRAVENOUS

## 2011-12-23 NOTE — ED Notes (Signed)
MD at bedside. [place] MD 

## 2011-12-23 NOTE — ED Notes (Signed)
MD at bedside. 

## 2011-12-23 NOTE — ED Notes (Signed)
Pt in from ortho Md when the pt felt dizzy & shaky & was lowered to the ground, EMS was called, upon EMS arrival the pt was in NSR & then started having PVCs & went into A fib & his pacer started firing, pt A&O x4, follows commands, speaks in complete sentences,  Pt has demand pacemaker in place, with known hx of a fib, pt on coumadin

## 2011-12-23 NOTE — ED Notes (Signed)
Pt placed on 2L [place]

## 2011-12-23 NOTE — ED Provider Notes (Signed)
History     CSN: [id number]  Arrival date & time 12/23/11  1550   First MD Initiated Contact with Patient 12/23/11 1552      Chief Complaint  Patient presents with  . Near Syncope    (Consider location/radiation/quality/duration/timing/severity/associated sxs/prior treatment) HPI Pt had a near syncopal episode while getting physical therapy today. Was lowered to the ground. No trauma. Pt states he feels close to his baseline. Has previous history of anemia and is on Iron replacement. No change in stool color. No fever chills, SOB, CP, abd pain, N/V/D. Noted to be orthostatic by EMS.  Past Medical History  Diagnosis Date  . Atrial fibrillation     Past Surgical History  Procedure Date  . Pacemaker insertion     History reviewed. No pertinent family history.  History  Substance Use Topics  . Smoking status: Not on file  . Smokeless tobacco: Not on file  . Alcohol Use: No      Review of Systems  Constitutional: Positive for fatigue. Negative for fever and chills.  HENT: Negative for neck pain.   Eyes: Negative for visual disturbance.  Respiratory: Negative for shortness of breath.   Cardiovascular: Negative for chest pain, palpitations and leg swelling.  Gastrointestinal: Negative for nausea, vomiting, abdominal pain, diarrhea and blood in stool.  Genitourinary: Negative for dysuria, frequency and flank pain.  Musculoskeletal: Positive for back pain. Negative for myalgias and arthralgias.  Skin: Negative for rash and wound.  Neurological: Positive for light-headedness. Negative for dizziness, syncope, weakness, numbness and headaches.    Allergies  Review of patient's allergies indicates no known allergies.  Home Medications   Current Outpatient Rx  Name  Route  Sig  Dispense  Refill  . ALPRAZOLAM 0.5 MG PO TABS   Oral   Take 0.5 mg by mouth daily as needed. For anxiety         . FINASTERIDE 5 MG PO TABS   Oral   Take 5 mg by mouth daily.             . FUROSEMIDE 40 MG PO TABS   Oral   Take 40 mg by mouth daily.           Marland Kitchen HYDROCODONE-ACETAMINOPHEN 5-500 MG PO TABS   Oral   Take 1 tablet by mouth every 4 (four) hours as needed for pain.   30 tablet   0   . HEMAX 150-1 MG PO TABS   Oral   Take 1 tablet by mouth daily.         Marland Kitchen LEVOTHYROXINE SODIUM 75 MCG PO TABS   Oral   Take 75 mcg by mouth daily.           Marland Kitchen LOVASTATIN 20 MG PO TABS   Oral   Take 40 mg by mouth at bedtime.           Marland Kitchen METOLAZONE 2.5 MG PO TABS   Oral   Take 2.5 mg by mouth every Monday, Wednesday, and Friday. As needed for fluid retention.         Marland Kitchen METOPROLOL SUCCINATE 12.5 MG HALF TABLET   Oral   Take 0.5 tablets (12.5 mg total) by mouth daily.   60 tablet   0   . OLMESARTAN MEDOXOMIL 20 MG PO TABS   Oral   Take 20 mg by mouth daily.         Marland Kitchen TERAZOSIN HCL 5 MG PO CAPS   Oral   Take 5  mg by mouth daily.           . WARFARIN SODIUM 4 MG PO TABS   Oral   Take 4 mg by mouth daily.            BP 104/56  Pulse 89  Temp 98.1 F (36.7 C) (Oral)  Resp 18  SpO2 96%  Physical Exam  Nursing note and vitals reviewed. Constitutional: He is oriented to person, place, and time. He appears well-developed and well-nourished. No distress.  HENT:  Head: Normocephalic and atraumatic.  Mouth/Throat: Oropharynx is clear and moist.  Eyes: EOM are normal. Pupils are equal, round, and reactive to light.  Neck: Normal range of motion. Neck supple.       Posterior neck without tenderness  Cardiovascular:       Irregular irregular  Pulmonary/Chest: Effort normal and breath sounds normal. No respiratory distress. He has no wheezes. He has no rales.  Abdominal: Soft. Bowel sounds are normal. He exhibits no distension and no mass. There is no tenderness. There is no rebound and no guarding.  Musculoskeletal: Normal range of motion. He exhibits no edema and no tenderness.  Neurological: He is alert and oriented to person, place, and  time.       5/5 motor in all ext, sensation intact  Skin: Skin is warm and dry. No rash noted. No erythema.  Psychiatric: He has a normal mood and affect. His behavior is normal.    ED Course  Procedures (including critical care time)  Labs Reviewed  CBC WITH DIFFERENTIAL - Abnormal; Notable for the following:    RBC 3.60 (*)     Hemoglobin 11.6 (*)     HCT 34.3 (*)     Platelets 123 (*)     Monocytes Relative 14 (*)     Monocytes Absolute 1.2 (*)     All other components within normal limits  COMPREHENSIVE METABOLIC PANEL - Abnormal; Notable for the following:    Potassium 3.3 (*)     BUN 54 (*)     Creatinine, Ser 1.94 (*)     GFR calc non Af Amer 31 (*)     GFR calc Af Amer 36 (*)     All other components within normal limits  URINALYSIS, ROUTINE W REFLEX MICROSCOPIC - Abnormal; Notable for the following:    APPearance HAZY (*)     Leukocytes, UA TRACE (*)     All other components within normal limits  PROTIME-INR - Abnormal; Notable for the following:    Prothrombin Time 23.7 (*)     INR 2.23 (*)     All other components within normal limits  URINE MICROSCOPIC-ADD ON - Abnormal; Notable for the following:    Squamous Epithelial / LPF FEW (*)     All other components within normal limits  APTT  POCT I-STAT TROPONIN I  LAB REPORT - SCANNED   No results found.   1. Near syncope   2. Renal insufficiency      Date: 12/23/2011  Rate:125  Rhythm: atrial fibrillation  QRS Axis: normal  Intervals: normal  ST/T Wave abnormalities: nonspecific T wave changes  Conduction Disutrbances:none  Narrative Interpretation:   Old EKG Reviewed: changes noted Atrial fib with ventricular paced complexes.    MDM   Pt states he is feeling much better. Tolerating PO's. Pt encouraged to f/u with PMD for mildly increased Cr and return immediately for worsening symptoms or concerns       Loren Racer,  MD 12/24/11 2352

## 2012-01-20 ENCOUNTER — Emergency Department (HOSPITAL_COMMUNITY)
Admission: EM | Admit: 2012-01-20 | Discharge: 2012-01-20 | Disposition: A | Discharge status: Home / Self Care | Payer: Medicare Other | Attending: Emergency Medicine | Admitting: Emergency Medicine

## 2012-01-20 ENCOUNTER — Encounter (HOSPITAL_COMMUNITY): Payer: Self-pay

## 2012-01-20 ENCOUNTER — Emergency Department (HOSPITAL_COMMUNITY): Payer: Medicare Other

## 2012-01-20 DIAGNOSIS — Z87442 Personal history of urinary calculi: Secondary | ICD-10-CM | POA: Insufficient documentation

## 2012-01-20 DIAGNOSIS — Z7901 Long term (current) use of anticoagulants: Secondary | ICD-10-CM | POA: Insufficient documentation

## 2012-01-20 DIAGNOSIS — I4891 Unspecified atrial fibrillation: Secondary | ICD-10-CM | POA: Insufficient documentation

## 2012-01-20 DIAGNOSIS — R948 Abnormal results of function studies of other organs and systems: Secondary | ICD-10-CM | POA: Insufficient documentation

## 2012-01-20 DIAGNOSIS — Z950 Presence of cardiac pacemaker: Secondary | ICD-10-CM | POA: Insufficient documentation

## 2012-01-20 DIAGNOSIS — Z862 Personal history of diseases of the blood and blood-forming organs and certain disorders involving the immune mechanism: Secondary | ICD-10-CM | POA: Insufficient documentation

## 2012-01-20 DIAGNOSIS — Z79899 Other long term (current) drug therapy: Secondary | ICD-10-CM | POA: Insufficient documentation

## 2012-01-20 DIAGNOSIS — N390 Urinary tract infection, site not specified: Secondary | ICD-10-CM | POA: Insufficient documentation

## 2012-01-20 DIAGNOSIS — R9389 Abnormal findings on diagnostic imaging of other specified body structures: Secondary | ICD-10-CM

## 2012-01-20 LAB — CBC WITH DIFFERENTIAL/PLATELET
Basophils Absolute: 0 10*3/uL (ref 0.0–0.1)
Basophils Relative: 0 % (ref 0–1)
Eosinophils Relative: 2 % (ref 0–5)
HCT: 34.1 % — ABNORMAL LOW (ref 39.0–52.0)
Lymphocytes Relative: 21 % (ref 12–46)
MCHC: 33.4 g/dL (ref 30.0–36.0)
MCV: 95.3 fL (ref 78.0–100.0)
Monocytes Absolute: 0.8 10*3/uL (ref 0.1–1.0)
RDW: 12.7 % (ref 11.5–15.5)

## 2012-01-20 LAB — URINALYSIS, ROUTINE W REFLEX MICROSCOPIC
Bilirubin Urine: NEGATIVE
Hgb urine dipstick: NEGATIVE
Nitrite: NEGATIVE
Specific Gravity, Urine: 1.016 (ref 1.005–1.030)
pH: 6 (ref 5.0–8.0)

## 2012-01-20 LAB — PROTIME-INR
INR: 2.95 — ABNORMAL HIGH (ref 0.00–1.49)
Prothrombin Time: 29.2 seconds — ABNORMAL HIGH (ref 11.6–15.2)

## 2012-01-20 LAB — BASIC METABOLIC PANEL
CO2: 28 mEq/L (ref 19–32)
Calcium: 9.6 mg/dL (ref 8.4–10.5)
Creatinine, Ser: 2.01 mg/dL — ABNORMAL HIGH (ref 0.50–1.35)

## 2012-01-20 LAB — URINE MICROSCOPIC-ADD ON

## 2012-01-20 LAB — OCCULT BLOOD, POC DEVICE: Fecal Occult Bld: NEGATIVE

## 2012-01-20 MED ORDER — DEXTROSE 5 % IV SOLN
1.0000 g | Freq: Once | INTRAVENOUS | Status: AC
  Administered 2012-01-20: 1 g via INTRAVENOUS
  Filled 2012-01-20: qty 10

## 2012-01-20 MED ORDER — CEPHALEXIN 500 MG PO CAPS: 500.0000 mg | ORAL_CAPSULE | Freq: Four times a day (QID) | ORAL | Status: DC

## 2012-01-20 MED ORDER — SODIUM CHLORIDE 0.9 % IV BOLUS (SEPSIS)
1000.0000 mL | Freq: Once | INTRAVENOUS | Status: AC
  Administered 2012-01-20: 1000 mL via INTRAVENOUS

## 2012-01-20 NOTE — ED Notes (Signed)
MD at bedside. 

## 2012-01-20 NOTE — ED Provider Notes (Signed)
History     CSN: [id number]  Arrival date & time 01/20/12  4098   First MD Initiated Contact with Patient 01/20/12 [phone]      Chief Complaint  Patient presents with  . Abdominal Pain    (Consider location/radiation/quality/duration/timing/severity/associated sxs/prior treatment) HPI  78 year old male with history of atrial fibrillation on warfarin and history of anemia presents for evaluations of abdominal pain.  Pt reports having intermittent sharp stabbing pain which started at his R flank and radiates to this R groin which began last night.  Pain usually lasting for seconds, unprovoked, moderate in intensity and with mild nausea but without vomit or diarrhea.  Denies fever, chills, lightheadedness, dizziness, headache, cp, sob, dysuria, hematuria or rash.  No hematochezia or melena. No recent strenuous exercise. Has hx of kidney stone 10 years ago, but felt that pain is different.  Did take one hydrocodone at home with some relief.    Past Medical History  Diagnosis Date  . Atrial fibrillation     Past Surgical History  Procedure Date  . Pacemaker insertion     History reviewed. No pertinent family history.  History  Substance Use Topics  . Smoking status: Not on file  . Smokeless tobacco: Not on file  . Alcohol Use: No      Review of Systems  Constitutional: Negative for fever and chills.  HENT: Negative for neck pain.   Respiratory: Negative for chest tightness and shortness of breath.   Cardiovascular: Negative for chest pain.  Gastrointestinal: Positive for nausea and abdominal pain. Negative for vomiting, diarrhea, blood in stool and rectal pain.  Genitourinary: Positive for flank pain. Negative for hematuria, penile pain and testicular pain.  Musculoskeletal: Negative for back pain.  Skin: Negative for rash.  Neurological: Negative for numbness.    Allergies  Review of patient's allergies indicates no known allergies.  Home Medications   Current  Outpatient Rx  Name  Route  Sig  Dispense  Refill  . ALPRAZOLAM 0.5 MG PO TABS   Oral   Take 0.5 mg by mouth daily as needed. For anxiety         . FINASTERIDE 5 MG PO TABS   Oral   Take 5 mg by mouth daily.           . FUROSEMIDE 40 MG PO TABS   Oral   Take 40 mg by mouth daily.           Marland Kitchen HYDROCODONE-ACETAMINOPHEN 5-500 MG PO TABS   Oral   Take 1 tablet by mouth every 4 (four) hours as needed for pain.   30 tablet   0   . HEMAX 150-1 MG PO TABS   Oral   Take 1 tablet by mouth daily.         Marland Kitchen LEVOTHYROXINE SODIUM 75 MCG PO TABS   Oral   Take 75 mcg by mouth daily.           Marland Kitchen LOVASTATIN 20 MG PO TABS   Oral   Take 40 mg by mouth at bedtime.           Marland Kitchen METOLAZONE 2.5 MG PO TABS   Oral   Take 2.5 mg by mouth every Monday, Wednesday, and Friday. As needed for fluid retention.         Marland Kitchen METOPROLOL SUCCINATE 12.5 MG HALF TABLET   Oral   Take 0.5 tablets (12.5 mg total) by mouth daily.   60 tablet   0   .  OLMESARTAN MEDOXOMIL 20 MG PO TABS   Oral   Take 20 mg by mouth daily.         Marland Kitchen TERAZOSIN HCL 5 MG PO CAPS   Oral   Take 5 mg by mouth daily.           . WARFARIN SODIUM 4 MG PO TABS   Oral   Take 4 mg by mouth daily.            BP 95/57  Pulse 62  Temp 97.9 F (36.6 C)  Resp 14  SpO2 100%  Physical Exam  Nursing note and vitals reviewed. Constitutional: He is oriented to person, place, and time. He appears well-developed and well-nourished. No distress.       Awake, alert, nontoxic appearance  HENT:  Head: Atraumatic.  Eyes: Conjunctivae normal are normal. Right eye exhibits no discharge. Left eye exhibits no discharge.  Neck: Normal range of motion. Neck supple.  Cardiovascular: Normal rate and regular rhythm.   Pulmonary/Chest: Effort normal. No respiratory distress. He exhibits no tenderness.  Abdominal: Soft. There is no tenderness. There is no rebound.       Mild R CVA tenderness.  No midline spine tenderness.  Mild  RLQ tenderness without guarding or rebound tenderness.    No McBurney's point.  No obturator/psoas sign.    No hernia    Musculoskeletal: He exhibits no edema and no tenderness.       ROM appears intact, no obvious focal weakness  Neurological: He is alert and oriented to person, place, and time.  Skin: Skin is warm and dry. No rash noted.  Psychiatric: He has a normal mood and affect.    ED Course  Procedures (including critical care time)  Labs Reviewed - No data to display No results found.   No diagnosis found.   Date: [id number]  Rate: 61  Rhythm: Junctional rhythm  QRS Axis: left  Intervals: normal  ST/T Wave abnormalities: nonspecific ST/T changes  Conduction Disutrbances:left bundle branch block  Narrative Interpretation: Paired PVC, inferior Q waves, possibly due to LBBB  Old EKG Reviewed: Paced rhythm from old ECG on Nov 27th, 2013  Results for orders placed during the hospital encounter of 01/20/12  CBC WITH DIFFERENTIAL      Component Value Range   WBC 7.2  4.0 - 10.5 K/uL   RBC 3.58 (*) 4.22 - 5.81 MIL/uL   Hemoglobin 11.4 (*) 13.0 - 17.0 g/dL   HCT 09.8 (*) 11.9 - 14.7 %   MCV 95.3  78.0 - 100.0 fL   MCH 31.8  26.0 - 34.0 pg   MCHC 33.4  30.0 - 36.0 g/dL   RDW 82.9  56.2 - 13.0 %   Platelets 121 (*) 150 - 400 K/uL   Neutrophils Relative 66  43 - 77 %   Neutro Abs 4.8  1.7 - 7.7 K/uL   Lymphocytes Relative 21  12 - 46 %   Lymphs Abs 1.5  0.7 - 4.0 K/uL   Monocytes Relative 11  3 - 12 %   Monocytes Absolute 0.8  0.1 - 1.0 K/uL   Eosinophils Relative 2  0 - 5 %   Eosinophils Absolute 0.1  0.0 - 0.7 K/uL   Basophils Relative 0  0 - 1 %   Basophils Absolute 0.0  0.0 - 0.1 K/uL  BASIC METABOLIC PANEL      Component Value Range   Sodium 144  135 - 145 mEq/L   Potassium 3.5  3.5 - 5.1 mEq/L   Chloride 103  96 - 112 mEq/L   CO2 28  19 - 32 mEq/L   Glucose, Bld 101 (*) 70 - 99 mg/dL   BUN 51 (*) 6 - 23 mg/dL   Creatinine, Ser 4.09 (*) 0.50 - 1.35  mg/dL   Calcium 9.6  8.4 - 81.1 mg/dL   GFR calc non Af Amer 30 (*) >90 mL/min   GFR calc Af Amer 35 (*) >90 mL/min  URINALYSIS, ROUTINE W REFLEX MICROSCOPIC      Component Value Range   Color, Urine YELLOW  YELLOW   APPearance CLEAR  CLEAR   Specific Gravity, Urine 1.016  1.005 - 1.030   pH 6.0  5.0 - 8.0   Glucose, UA NEGATIVE  NEGATIVE mg/dL   Hgb urine dipstick NEGATIVE  NEGATIVE   Bilirubin Urine NEGATIVE  NEGATIVE   Ketones, ur NEGATIVE  NEGATIVE mg/dL   Protein, ur NEGATIVE  NEGATIVE mg/dL   Urobilinogen, UA 1.0  0.0 - 1.0 mg/dL   Nitrite NEGATIVE  NEGATIVE   Leukocytes, UA MODERATE (*) NEGATIVE  PROTIME-INR      Component Value Range   Prothrombin Time 29.2 (*) 11.6 - 15.2 seconds   INR 2.95 (*) 0.00 - 1.49  OCCULT BLOOD, POC DEVICE      Component Value Range   Fecal Occult Bld NEGATIVE  NEGATIVE  URINE MICROSCOPIC-ADD ON      Component Value Range   Squamous Epithelial / LPF FEW (*) RARE   WBC, UA 11-20  <3 WBC/hpf   RBC / HPF 0-2  <3 RBC/hpf   Bacteria, UA MANY (*) RARE  LACTIC ACID, PLASMA      Component Value Range   Lactic Acid, Venous 1.5  0.5 - 2.2 mmol/L   Ct Abdomen Pelvis Wo Contrast  01/20/2012  *RADIOLOGY REPORT*  Clinical Data: Right flank pain, nausea, history of chronic kidney disease  CT ABDOMEN AND PELVIS WITHOUT CONTRAST  Technique:  Multidetector CT imaging of the abdomen and pelvis was performed following the standard protocol without intravenous contrast.  Comparison: None.  Findings: The lung bases are clear other than a small pleural based nodular opacity medially in the right lower lobe of questionable significance most likely fatty in density.  There is mild cardiomegaly present and a pacer wire is noted.  The liver is somewhat small with no ductal dilatation is seen.  There is higher attenuation debris layering in the neck of the gallbladder which may represent gallbladder sludge or possibly noncalcified gallstones.  There is fatty infiltration  throughout the pancreas. The pancreatic duct is not dilated.  The adrenal glands and spleen are unremarkable.  The stomach is moderately distended with no abnormality noted.  No hydronephrosis is seen.  There is a low attenuation structure emanating from the mid right kidney anterolaterally with an attenuation of 5 HU consistent with cysts measuring 2.3 cm.  In addition there is a more solid appearing exophytic lesion posteriorly in the right mid lateral kidney measuring 13 mm in diameter with an attenuation of 32 HU.  This is worrisome for small renal cell carcinoma.  No hydronephrosis is seen.  No renal calculi are noted.  The abdominal aorta is normal in caliber  The urinary bladder is moderately distended.  The prostate is prominent measuring 4.8 x 5.7 cm with calcifications.  Scattered rectosigmoid colonic diverticula present.  The terminal ileum and the appendix are unremarkable.  No fluid is seen within the pelvis. Linear artifacts  from right total hip replacement are noted.  The bones are osteopenic.  Degenerative disc disease is noted diffusely throughout the lumbar spine.  IMPRESSION:  1.  No explanation for the patient's right flank pain is seen. However, there is an exophytic solid appearing lesion emanating from the right mid lateral kidney of 13 mm in diameter suspicious for small renal cell carcinoma. 2.  Gallbladder sludge or noncalcified gallstones. 3.  Prominent prostate. 4.  Scattered rectosigmoid colonic diverticula.   Original Report Authenticated By: Dwyane Dee, M.D.     1. UTI 2. Abnormal finding on CT  MDM  Pt presents with intermittent sharp pain from R flank to R groin, prior hx of kidney stone.  Pt also has hx of afib on coumadin.  DDx: kidney stone, bowel ischemia, diverticulitis, UTI, AAA, musculoskeletal  Work up initiated.  Non surgical abdomen.     7:49 AM Pt resting comfortably, abdomen nontender on reexamination.    UA shows evidence suggestive of UTI.  Urine culture  sent. Rocephin given.  Otherwise his labs are at baseline.  Normal WBC.  Will obtain CT scan and lactic acid as recommended by Dr. Nunzio Cory.  I discussed finding with patient and recommend pt to return if his abd pain worsen despite treatment.  Pt voice understanding and agrees with plan.     9:23 AM CT scan shows no acute finding, no kidney stone.  There is a lesion on the R kidney concerning for renal cell carcinoma.  I discussed the finding with pt, pt will follow up with his renal doctor in [place] next week for further evaluation.  Otherwise pt stable for discharge.  Keflex prescribed.    BP 106/56  Pulse 66  Temp 98.2 F (36.8 C) (Oral)  Resp 14  SpO2 100%  I have reviewed nursing notes and vital signs. I personally reviewed the imaging tests through PACS system  I reviewed available ER/hospitalization records thought the EMR   Fayrene Helper, [place] 01/20/12 1610

## 2012-01-20 NOTE — ED Notes (Signed)
Pt provided apple juice. Pt resting. No needs at this time. Wife at bedside.

## 2012-01-20 NOTE — ED Notes (Addendum)
Pt A.O. X 4. Reports sharp shooting pain  In right lower quadrant. Radiates to right flank. x 1 day. Reports 3 episodes lasting less than 1 min since 0300 today. States the pain leaves me exhausted and short of breath. Reports nausea and dry heaves with no vomiting. Pain currently 2/10. "When it hits, 10/10".  Denies changes in urination or bowel movements.  Reports hxt of kidney stones.Reports cardiac pacemaker in place. Saw cardiologist 10 days ago with no abnormal findings. Denies chest pain. Denies being around anyone who is sick.  Wife at bedside.

## 2012-01-20 NOTE — ED Provider Notes (Addendum)
Complains of right flank pain radiating to right groin onset 3 AM today intermittent lasting 30 minutes at a time not made better or worse by anything. He presently complains of pain at right lower back which is improved by flexing at the hips he admits to mild generalized weakness denies fever. Patient reported nausea earlier no nausea present no treatment prior to coming here on exam not ill-appearing lungs clear auscultation abdomen nondistended normal active bowel sounds nontender back without flank tenderness however he complains of pain in right lower back upon lying supine.   Doug Sou, MD 01/20/12 0800 Patient signed out to Dr.Sheldon at 805 am  Doug Sou, MD 01/20/12 [phone]

## 2012-01-21 LAB — URINE CULTURE

## 2012-01-21 NOTE — ED Provider Notes (Signed)
Medical screening examination/treatment/procedure(s) were conducted as a shared visit with non-physician practitioner(s) and myself.  I personally evaluated the patient during the encounter  Doug Sou, MD 01/21/12 0120

## 2012-02-05 ENCOUNTER — Other Ambulatory Visit: Payer: Self-pay | Admitting: Physical Medicine and Rehabilitation

## 2012-02-05 DIAGNOSIS — M4800 Spinal stenosis, site unspecified: Secondary | ICD-10-CM

## 2012-02-10 ENCOUNTER — Ambulatory Visit
Admission: RE | Admit: 2012-02-10 | Discharge: 2012-02-10 | Disposition: A | Discharge status: Home / Self Care | Payer: Medicare Other | Source: Ambulatory Visit | Attending: Physical Medicine and Rehabilitation | Admitting: Physical Medicine and Rehabilitation

## 2012-02-10 VITALS — BP 99/55 | HR 85

## 2012-02-10 DIAGNOSIS — M4800 Spinal stenosis, site unspecified: Secondary | ICD-10-CM

## 2012-02-10 MED ORDER — DIAZEPAM 5 MG PO TABS
5.0000 mg | ORAL_TABLET | Freq: Once | ORAL | Status: AC
  Administered 2012-02-10: 5 mg via ORAL

## 2012-02-10 MED ORDER — IOHEXOL 180 MG/ML  SOLN
15.0000 mL | Freq: Once | INTRAMUSCULAR | Status: AC | PRN
  Administered 2012-02-10: 15 mL via INTRATHECAL

## 2012-02-10 MED ORDER — ONDANSETRON HCL 4 MG/2ML IJ SOLN
4.0000 mg | Freq: Once | INTRAMUSCULAR | Status: AC
  Administered 2012-02-10: 4 mg via INTRAMUSCULAR

## 2012-02-10 MED ORDER — HYDROMORPHONE HCL PF 2 MG/ML IJ SOLN
2.0000 mg | Freq: Once | INTRAMUSCULAR | Status: AC
  Administered 2012-02-10: 2 mg via INTRAMUSCULAR

## 2012-02-10 NOTE — Progress Notes (Signed)
Patient states last Thursday, February 04, 2012 was the last dose of Coumadin.   INR is 1.39.  jkl

## 2012-02-11 ENCOUNTER — Encounter (HOSPITAL_COMMUNITY): Payer: Self-pay | Admitting: Emergency Medicine

## 2012-02-11 ENCOUNTER — Inpatient Hospital Stay (HOSPITAL_COMMUNITY)
Admission: EM | Admit: 2012-02-11 | Discharge: 2012-02-16 | DRG: 490 | Disposition: A | Discharge status: Skilled Nursing Facility | Payer: Medicare Other | Attending: Internal Medicine | Admitting: Internal Medicine

## 2012-02-11 DIAGNOSIS — S73004A Unspecified dislocation of right hip, initial encounter: Secondary | ICD-10-CM | POA: Diagnosis present

## 2012-02-11 DIAGNOSIS — E785 Hyperlipidemia, unspecified: Secondary | ICD-10-CM | POA: Diagnosis present

## 2012-02-11 DIAGNOSIS — Z96649 Presence of unspecified artificial hip joint: Secondary | ICD-10-CM

## 2012-02-11 DIAGNOSIS — M549 Dorsalgia, unspecified: Secondary | ICD-10-CM

## 2012-02-11 DIAGNOSIS — Z7901 Long term (current) use of anticoagulants: Secondary | ICD-10-CM

## 2012-02-11 DIAGNOSIS — N400 Enlarged prostate without lower urinary tract symptoms: Secondary | ICD-10-CM | POA: Diagnosis present

## 2012-02-11 DIAGNOSIS — D631 Anemia in chronic kidney disease: Secondary | ICD-10-CM | POA: Diagnosis present

## 2012-02-11 DIAGNOSIS — I129 Hypertensive chronic kidney disease with stage 1 through stage 4 chronic kidney disease, or unspecified chronic kidney disease: Secondary | ICD-10-CM | POA: Diagnosis present

## 2012-02-11 DIAGNOSIS — K219 Gastro-esophageal reflux disease without esophagitis: Secondary | ICD-10-CM | POA: Diagnosis present

## 2012-02-11 DIAGNOSIS — K5900 Constipation, unspecified: Secondary | ICD-10-CM

## 2012-02-11 DIAGNOSIS — M4804 Spinal stenosis, thoracic region: Principal | ICD-10-CM | POA: Diagnosis present

## 2012-02-11 DIAGNOSIS — I4891 Unspecified atrial fibrillation: Secondary | ICD-10-CM | POA: Diagnosis present

## 2012-02-11 DIAGNOSIS — D638 Anemia in other chronic diseases classified elsewhere: Secondary | ICD-10-CM

## 2012-02-11 DIAGNOSIS — I10 Essential (primary) hypertension: Secondary | ICD-10-CM | POA: Diagnosis present

## 2012-02-11 DIAGNOSIS — R29898 Other symptoms and signs involving the musculoskeletal system: Secondary | ICD-10-CM | POA: Diagnosis present

## 2012-02-11 DIAGNOSIS — Z950 Presence of cardiac pacemaker: Secondary | ICD-10-CM

## 2012-02-11 DIAGNOSIS — M4800 Spinal stenosis, site unspecified: Secondary | ICD-10-CM | POA: Diagnosis present

## 2012-02-11 DIAGNOSIS — D696 Thrombocytopenia, unspecified: Secondary | ICD-10-CM | POA: Diagnosis present

## 2012-02-11 DIAGNOSIS — N390 Urinary tract infection, site not specified: Secondary | ICD-10-CM | POA: Diagnosis present

## 2012-02-11 DIAGNOSIS — N183 Chronic kidney disease, stage 3 unspecified: Secondary | ICD-10-CM | POA: Diagnosis present

## 2012-02-11 DIAGNOSIS — E039 Hypothyroidism, unspecified: Secondary | ICD-10-CM | POA: Diagnosis present

## 2012-02-11 DIAGNOSIS — Z8489 Family history of other specified conditions: Secondary | ICD-10-CM

## 2012-02-11 LAB — URINALYSIS, ROUTINE W REFLEX MICROSCOPIC
Glucose, UA: NEGATIVE mg/dL
Hgb urine dipstick: NEGATIVE
Specific Gravity, Urine: 1.01 (ref 1.005–1.030)
Urobilinogen, UA: 0.2 mg/dL (ref 0.0–1.0)

## 2012-02-11 LAB — CBC WITH DIFFERENTIAL/PLATELET
Lymphs Abs: 1.1 10*3/uL (ref 0.7–4.0)
MCH: 32.1 pg (ref 26.0–34.0)
MCHC: 33.4 g/dL (ref 30.0–36.0)
MCV: 96.1 fL (ref 78.0–100.0)
Monocytes Absolute: 0.9 10*3/uL (ref 0.1–1.0)
Monocytes Relative: 13 % — ABNORMAL HIGH (ref 3–12)
Neutrophils Relative %: 70 % (ref 43–77)
Platelets: 127 10*3/uL — ABNORMAL LOW (ref 150–400)
RBC: 3.36 MIL/uL — ABNORMAL LOW (ref 4.22–5.81)
RDW: 12.8 % (ref 11.5–15.5)

## 2012-02-11 LAB — BASIC METABOLIC PANEL
BUN: 45 mg/dL — ABNORMAL HIGH (ref 6–23)
CO2: 31 mEq/L (ref 19–32)
Chloride: 99 mEq/L (ref 96–112)
Creatinine, Ser: 1.91 mg/dL — ABNORMAL HIGH (ref 0.50–1.35)
Glucose, Bld: 130 mg/dL — ABNORMAL HIGH (ref 70–99)

## 2012-02-11 MED ORDER — METOLAZONE 2.5 MG PO TABS
2.5000 mg | ORAL_TABLET | ORAL | Status: DC
  Administered 2012-02-12 – 2012-02-15 (×2): 2.5 mg via ORAL
  Filled 2012-02-11 (×2): qty 1

## 2012-02-11 MED ORDER — DEXTROSE 5 % IV SOLN
1.0000 g | INTRAVENOUS | Status: DC
  Administered 2012-02-11: 1 g via INTRAVENOUS
  Filled 2012-02-11: qty 10

## 2012-02-11 MED ORDER — HYDROMORPHONE HCL PF 1 MG/ML IJ SOLN: 1.0000 mg | INTRAMUSCULAR | Status: DC | PRN

## 2012-02-11 MED ORDER — ONDANSETRON HCL 4 MG PO TABS: 4.0000 mg | ORAL_TABLET | Freq: Four times a day (QID) | ORAL | Status: DC | PRN

## 2012-02-11 MED ORDER — SODIUM CHLORIDE 0.9 % IJ SOLN
3.0000 mL | Freq: Two times a day (BID) | INTRAMUSCULAR | Status: DC
  Administered 2012-02-13 – 2012-02-16 (×6): 3 mL via INTRAVENOUS

## 2012-02-11 MED ORDER — METOPROLOL SUCCINATE 12.5 MG HALF TABLET
12.5000 mg | ORAL_TABLET | Freq: Every day | ORAL | Status: DC
  Administered 2012-02-12 – 2012-02-16 (×5): 12.5 mg via ORAL
  Filled 2012-02-11 (×5): qty 1

## 2012-02-11 MED ORDER — SIMVASTATIN 10 MG PO TABS
10.0000 mg | ORAL_TABLET | Freq: Every day | ORAL | Status: DC
  Administered 2012-02-11 – 2012-02-15 (×5): 10 mg via ORAL
  Filled 2012-02-11 (×6): qty 1

## 2012-02-11 MED ORDER — HYDROCODONE-ACETAMINOPHEN 5-325 MG PO TABS
1.0000 | ORAL_TABLET | Freq: Once | ORAL | Status: AC
  Administered 2012-02-11: 1 via ORAL
  Filled 2012-02-11: qty 1

## 2012-02-11 MED ORDER — ONDANSETRON HCL 4 MG/2ML IJ SOLN: 4.0000 mg | Freq: Three times a day (TID) | INTRAMUSCULAR | Status: AC | PRN

## 2012-02-11 MED ORDER — ACETAMINOPHEN 325 MG PO TABS: 650.0000 mg | ORAL_TABLET | Freq: Four times a day (QID) | ORAL | Status: DC | PRN

## 2012-02-11 MED ORDER — WARFARIN - PHARMACIST DOSING INPATIENT: Freq: Every day | Status: DC

## 2012-02-11 MED ORDER — HYDROCODONE-ACETAMINOPHEN 5-325 MG PO TABS: 1.0000 | ORAL_TABLET | ORAL | Status: DC | PRN

## 2012-02-11 MED ORDER — ALPRAZOLAM 0.5 MG PO TABS: 0.5000 mg | ORAL_TABLET | Freq: Every day | ORAL | Status: DC | PRN

## 2012-02-11 MED ORDER — HYDROCODONE-ACETAMINOPHEN 5-325 MG PO TABS
1.0000 | ORAL_TABLET | ORAL | Status: DC | PRN
  Administered 2012-02-12 (×2): 1 via ORAL
  Filled 2012-02-11 (×2): qty 1

## 2012-02-11 MED ORDER — WARFARIN SODIUM 4 MG PO TABS: 4.0000 mg | ORAL_TABLET | Freq: Every day | ORAL | Status: DC

## 2012-02-11 MED ORDER — FINASTERIDE 5 MG PO TABS
5.0000 mg | ORAL_TABLET | Freq: Every day | ORAL | Status: DC
  Administered 2012-02-11 – 2012-02-16 (×6): 5 mg via ORAL
  Filled 2012-02-11 (×6): qty 1

## 2012-02-11 MED ORDER — LEVOTHYROXINE SODIUM 75 MCG PO TABS
75.0000 ug | ORAL_TABLET | Freq: Every day | ORAL | Status: DC
  Administered 2012-02-12 – 2012-02-16 (×5): 75 ug via ORAL
  Filled 2012-02-11 (×7): qty 1

## 2012-02-11 MED ORDER — WARFARIN SODIUM 6 MG PO TABS
6.0000 mg | ORAL_TABLET | Freq: Once | ORAL | Status: AC
  Administered 2012-02-11: 6 mg via ORAL
  Filled 2012-02-11: qty 1

## 2012-02-11 MED ORDER — TERAZOSIN HCL 5 MG PO CAPS
5.0000 mg | ORAL_CAPSULE | Freq: Every day | ORAL | Status: DC
  Administered 2012-02-11 – 2012-02-16 (×6): 5 mg via ORAL
  Filled 2012-02-11 (×6): qty 1

## 2012-02-11 MED ORDER — ONDANSETRON HCL 4 MG/2ML IJ SOLN
4.0000 mg | Freq: Four times a day (QID) | INTRAMUSCULAR | Status: DC | PRN
  Administered 2012-02-13: 4 mg via INTRAVENOUS
  Filled 2012-02-11: qty 2

## 2012-02-11 MED ORDER — ACETAMINOPHEN 650 MG RE SUPP: 650.0000 mg | Freq: Four times a day (QID) | RECTAL | Status: DC | PRN

## 2012-02-11 MED ORDER — SODIUM CHLORIDE 0.9 % IV SOLN
INTRAVENOUS | Status: DC
  Administered 2012-02-11: 22:00:00 via INTRAVENOUS

## 2012-02-11 MED ORDER — IRBESARTAN 150 MG PO TABS
150.0000 mg | ORAL_TABLET | Freq: Every day | ORAL | Status: DC
  Administered 2012-02-12: 150 mg via ORAL
  Filled 2012-02-11: qty 1

## 2012-02-11 NOTE — ED Notes (Signed)
Pt BIB EMS. Pt is from home. Pt c/o bilateral weakness when he tries to walk. Pt has pain in back that radiates to groin. Pt has had symptoms since Christmas. Pt went to PMD yesterday and had CT scan done for same. Pt waiting for results of scan. Pt was ambulatory with assistance with EMS. EMS states pt has skin tears on knees and elbows from recent falls.

## 2012-02-11 NOTE — ED Provider Notes (Signed)
History     CSN: [id number]  Arrival date & time 02/11/12  1656   First MD Initiated Contact with Patient 02/11/12 1803      Chief Complaint  Patient presents with  . Leg Pain    (Consider location/radiation/quality/duration/timing/severity/associated sxs/prior treatment) HPI Comments: Patients with history of lumbar fusion, right hip replacement, pacemaker due to A. Fib -- presents at the direction of his orthopedic office for evaluation of bilateral lower extremity weakness, back pain and frequent falls. Patient states that he has had back pain for the past several weeks. This is worse with standing. Patient states that when he stands straight up his legs give out beginning with his right leg. He denies urinary or fecal incontinence. Patient had CT myelography performed yesterday which showed spinal stenosis worst at L5-S1 patient denies other complaints. Onset gradual. Course is gradually worsening. Nothing makes symptoms better or worse.  The history is provided by the patient and medical records.    Past Medical History  Diagnosis Date  . Atrial fibrillation     Past Surgical History  Procedure Date  . Pacemaker insertion     No family history on file.  History  Substance Use Topics  . Smoking status: Not on file  . Smokeless tobacco: Not on file  . Alcohol Use: No      Review of Systems  Constitutional: Negative for fever and unexpected weight change.  HENT: Negative for sore throat and rhinorrhea.   Eyes: Negative for redness.  Respiratory: Negative for cough.   Cardiovascular: Negative for chest pain.  Gastrointestinal: Negative for nausea, vomiting, abdominal pain, diarrhea and constipation.       Neg for fecal incontinence  Genitourinary: Negative for dysuria, hematuria, flank pain and difficulty urinating.       Negative for urinary incontinence or retention  Musculoskeletal: Positive for back pain and gait problem. Negative for myalgias and arthralgias.    Skin: Negative for rash.  Neurological: Positive for weakness. Negative for numbness and headaches.       Negative for saddle paresthesias     Allergies  Review of patient's allergies indicates no known allergies.  Home Medications   Current Outpatient Rx  Name  Route  Sig  Dispense  Refill  . ALPRAZOLAM 0.5 MG PO TABS   Oral   Take 0.5 mg by mouth daily as needed. For anxiety         . FINASTERIDE 5 MG PO TABS   Oral   Take 5 mg by mouth daily.           . FUROSEMIDE 40 MG PO TABS   Oral   Take 40 mg by mouth daily.           Marland Kitchen HYDROCODONE-ACETAMINOPHEN 5-500 MG PO TABS   Oral   Take 1 tablet by mouth every 4 (four) hours as needed for pain.   30 tablet   0   . LEVOTHYROXINE SODIUM 75 MCG PO TABS   Oral   Take 75 mcg by mouth daily.           Marland Kitchen LOVASTATIN 20 MG PO TABS   Oral   Take 40 mg by mouth at bedtime.           Marland Kitchen METOLAZONE 2.5 MG PO TABS   Oral   Take 2.5 mg by mouth every Monday, Wednesday, and Friday. As needed for fluid retention.         Marland Kitchen METOPROLOL SUCCINATE 12.5 MG HALF TABLET  Oral   Take 25 mg by mouth daily.         Marland Kitchen OLMESARTAN MEDOXOMIL 20 MG PO TABS   Oral   Take 20 mg by mouth daily.         Marland Kitchen TERAZOSIN HCL 5 MG PO CAPS   Oral   Take 5 mg by mouth daily.           . WARFARIN SODIUM 4 MG PO TABS   Oral   Take 4 mg by mouth daily. Hasn't taken since last Thursday due to the anticipation of the CT scan that took place today.  Usually takes daily         . CEPHALEXIN 500 MG PO CAPS   Oral   Take 1 capsule (500 mg total) by mouth 4 (four) times daily.   25 capsule   0     BP 129/79  Pulse 81  Temp 97.5 F (36.4 C) (Oral)  Resp 20  SpO2 100%  Physical Exam  Nursing note and vitals reviewed. Constitutional: He appears well-developed and well-nourished.  HENT:  Head: Normocephalic and atraumatic.  Eyes: Conjunctivae normal are normal.  Neck: Normal range of motion.  Abdominal: Soft. There is no  tenderness. There is no CVA tenderness.  Musculoskeletal: Normal range of motion. He exhibits no tenderness.       No step-off noted with palpation of spine.   Neurological: He is alert. He has normal reflexes. No sensory deficit. He exhibits normal muscle tone.       5/5 strength in entire lower extremities bilaterally except for 4+/5 hip flexion R hip. No sensation deficit.   Skin: Skin is warm and dry.  Psychiatric: He has a normal mood and affect.    ED Course  Procedures (including critical care time)  Labs Reviewed  CBC WITH DIFFERENTIAL - Abnormal; Notable for the following:    RBC 3.36 (*)     Hemoglobin 10.8 (*)     HCT 32.3 (*)     Platelets 127 (*)     Monocytes Relative 13 (*)     All other components within normal limits  BASIC METABOLIC PANEL - Abnormal; Notable for the following:    Glucose, Bld 130 (*)     BUN 45 (*)     Creatinine, Ser 1.91 (*)     GFR calc non Af Amer 32 (*)     GFR calc Af Amer 37 (*)     All other components within normal limits  PROTIME-INR - Abnormal; Notable for the following:    Prothrombin Time 15.5 (*)     All other components within normal limits  URINALYSIS, ROUTINE W REFLEX MICROSCOPIC   Ct Lumbar Spine W Contrast  02/10/2012  *RADIOLOGY REPORT*  MYELOGRAM INJECTION  Technique:  Informed consent was obtained from the patient prior to the procedure, including potential complications of headache, allergy, infection and pain. Specific instructions were given regarding 24 hour bedrest post procedure to prevent post-LP headache.  The patient's prothrombin time was checked and found to be within limits for safe performance of the invasive procedure.  A timeout procedure was performed.  With the patient prone, the lower back was prepped with Betadine.  1% Lidocaine was used for local anesthesia.  Lumbar puncture was performed by the radiologist at the L5-S1 level using a 22 gauge needle with return of clear CSF. I personally performed the lumbar  puncture and administered the intrathecal contrast. I also personally supervised acquisition of the  myelogram images. 15 cc of Omnipaque 180 was injected into the subarachnoid space .  IMPRESSION: Successful injection of  intrathecal contrast for myelography.  MYELOGRAM LUMBAR  Clinical Data:  Low back pain. Bilateral leg pain.  Comparison: None.  Findings: Good opacification of the lumbar subarachnoid space. Waist like narrowing at L2-3, L3-4, L4-5, and L5-S1 affects the exiting nerve roots at these levels. The changes are worst at L2-3 on the right, but relatively symmetric at the lower levels.  There is advanced disc space narrowing at L4-5 and L5-S1.  There is fusion anteriorly at L2-3 and L3-4.  Moderate anterior spurring at L1-2 likely results in functional fusion.  Transitional anatomy is present with a narrow S1-S2 disc space which is also fused.  The patient was not able to cooperate for standing flexion/extension views.  Fluoroscopy Time: 49 seconds  IMPRESSION: As above.  CT MYELOGRAPHY LUMBAR SPINE  Technique: Multidetector CT imaging of the lumbar spine was performed following myelography.  Multiplanar CT image reconstructions were also generated.  Findings:  Moderate atheromatous change of the aorta is nonaneurysmal.  No visible paravertebral masses.  Conus slightly low ending midbody L2.  L1-2: Mild facet arthropathy.  Mild annular bulging.  Anterior spurring results in functional fusion.  No neural compression.  L2-3: Calcified central and rightward protrusion with posterior element hypertrophy. Moderate central canal stenosis with right greater than left L3 nerve root encroachment.  Neural foraminal narrowing is unimpressive.  L3-4: Solid interbody fusion.  Slight regrowth of posterior laminectomy. Moderate facet overgrowth.  Solid posterolateral fusion.  Mild central canal stenosis.  Bilateral L4 nerve root encroachment in the lateral recess. Mild neural foraminal narrowing without clear-cut L3  nerve root encroachment.  L4-5: Solid interbody fusion.  Solid posterolateral fusion. Regrowth of posterior elements status post laminectomy.  Moderate facet overgrowth.  Moderate central canal stenosis with bilateral L5 nerve root encroachment.  Calcified central protrusion. Moderate neural foramen narrowing on the right could affect the L4 nerve root.  L5-S1: Advanced disc space narrowing with vacuum phenomenon. Shallow central protrusion.  Regrowth of posterior laminectomy. Moderate facet overgrowth.  Severe spinal stenosis with bilateral S1 nerve root encroachment.  In addition, there is bilateral neural foraminal narrowing affecting both L5 nerve roots.  IMPRESSION: Transitional anatomy.  See discussion above.  The dominant abnormality is at L5-S1 where multifactorial spinal stenosis as described above affects the L5 and S1 nerve roots bilaterally.  Similar less severe changes at L2-3, L3-4, and L4-5.   Original Report Authenticated By: Davonna Belling, M.D.    Dg Myelogram Lumbar  02/10/2012  *RADIOLOGY REPORT*  MYELOGRAM INJECTION  Technique:  Informed consent was obtained from the patient prior to the procedure, including potential complications of headache, allergy, infection and pain. Specific instructions were given regarding 24 hour bedrest post procedure to prevent post-LP headache.  The patient's prothrombin time was checked and found to be within limits for safe performance of the invasive procedure.  A timeout procedure was performed.  With the patient prone, the lower back was prepped with Betadine.  1% Lidocaine was used for local anesthesia.  Lumbar puncture was performed by the radiologist at the L5-S1 level using a 22 gauge needle with return of clear CSF. I personally performed the lumbar puncture and administered the intrathecal contrast. I also personally supervised acquisition of the myelogram images. 15 cc of Omnipaque 180 was injected into the subarachnoid space .  IMPRESSION: Successful  injection of  intrathecal contrast for myelography.  MYELOGRAM LUMBAR  Clinical  Data:  Low back pain. Bilateral leg pain.  Comparison: None.  Findings: Good opacification of the lumbar subarachnoid space. Waist like narrowing at L2-3, L3-4, L4-5, and L5-S1 affects the exiting nerve roots at these levels. The changes are worst at L2-3 on the right, but relatively symmetric at the lower levels.  There is advanced disc space narrowing at L4-5 and L5-S1.  There is fusion anteriorly at L2-3 and L3-4.  Moderate anterior spurring at L1-2 likely results in functional fusion.  Transitional anatomy is present with a narrow S1-S2 disc space which is also fused.  The patient was not able to cooperate for standing flexion/extension views.  Fluoroscopy Time: 49 seconds  IMPRESSION: As above.  CT MYELOGRAPHY LUMBAR SPINE  Technique: Multidetector CT imaging of the lumbar spine was performed following myelography.  Multiplanar CT image reconstructions were also generated.  Findings:  Moderate atheromatous change of the aorta is nonaneurysmal.  No visible paravertebral masses.  Conus slightly low ending midbody L2.  L1-2: Mild facet arthropathy.  Mild annular bulging.  Anterior spurring results in functional fusion.  No neural compression.  L2-3: Calcified central and rightward protrusion with posterior element hypertrophy. Moderate central canal stenosis with right greater than left L3 nerve root encroachment.  Neural foraminal narrowing is unimpressive.  L3-4: Solid interbody fusion.  Slight regrowth of posterior laminectomy. Moderate facet overgrowth.  Solid posterolateral fusion.  Mild central canal stenosis.  Bilateral L4 nerve root encroachment in the lateral recess. Mild neural foraminal narrowing without clear-cut L3 nerve root encroachment.  L4-5: Solid interbody fusion.  Solid posterolateral fusion. Regrowth of posterior elements status post laminectomy.  Moderate facet overgrowth.  Moderate central canal stenosis with  bilateral L5 nerve root encroachment.  Calcified central protrusion. Moderate neural foramen narrowing on the right could affect the L4 nerve root.  L5-S1: Advanced disc space narrowing with vacuum phenomenon. Shallow central protrusion.  Regrowth of posterior laminectomy. Moderate facet overgrowth.  Severe spinal stenosis with bilateral S1 nerve root encroachment.  In addition, there is bilateral neural foraminal narrowing affecting both L5 nerve roots.  IMPRESSION: Transitional anatomy.  See discussion above.  The dominant abnormality is at L5-S1 where multifactorial spinal stenosis as described above affects the L5 and S1 nerve roots bilaterally.  Similar less severe changes at L2-3, L3-4, and L4-5.   Original Report Authenticated By: Davonna Belling, M.D.      1. Back pain   2. Spinal stenosis   3. Lower extremity weakness     6:14 PM Patient seen and examined. D/w Dr. Radford Pax. Will call patient's orthopedics for reccs.   Vital signs reviewed and are as follows: Filed Vitals:   02/11/12 1704  BP: 129/79  Pulse: 81  Temp: 97.5 F (36.4 C)  Resp: 20   8:48 PM Orthopedics to see in morning. I have spoken with [facility] who will admit.    MDM  Patient with lower extremity weakness likely 2/2 to spinal stenosis. No indications for emergent surgery. Given inability to ambulate and pain, will admit and have ortho evaluate.         Renne Crigler, [place] 02/11/12 2049

## 2012-02-11 NOTE — H&P (Addendum)
[facility] History and Physical  [name] WUJ:[id number] DOB: 23-Jun-1930 DOA: 02/11/2012  Referring physician: ED physician PCP: Malka So., MD   Chief Complaint: weakness  HPI:  79 year old male with past medical history of severe arthritis and status post right hip replacement, Atrial fibrillation (status post pacemaker and on coumadin), HTN who presented to ED  for evaluation of bilateral lower extremity weakness and frequent falls. In addition, patient reports history of lower back pain which is aggravated by standing and somewhat relieved with sitting. Patient reported this as an ongoing problem for past few weeks prior to this admission. No associated urinary or rectal incontinence, no loss of sensation. Patient reports no complaints of chest pain, no shortness of breath, no palpitations. No abdominal pain, no nausea or vomiting. No reports of blood in stool or urine. In ED, patient was found to have spinal stenosis at L5-S1 based on CT scan lumbar spine. In addition, BMET revealed Creatinine of 1.91 (baseline 2.01 in 12/2011), mild anemia with hemoglobin of 10.8 (in 12/2011 hemoglobin was 11.4) and thrombocytopenia 127.  * Of note, review of patient's records reveals he has had CT abdomen/pelvis without contrast had findings suspicious of renal cell carcinoma. Pt is aware of the diagnosis and wants to follow up first with PCP in [facility].  Assessment and Plan:  Principal Problem:  *Lower extremity weakness in the setting of spinal stenosis  Will need further evaluation by ortho  Continue supportive care with IV fluids and analgesia (norco po prn and dilaudid iv prn)  PT evaluation  Active Problems:  HTN (hypertension)  BP on soft side on admission, SBP in 90's so we will hold lasix and olmesartan  Will continue metoprolol 25 mg daily only  Anemia of chronic disease  Hemoglobin 12/2011 = 11.4  On this admission hemoglobin 10.8  No signs of active  bleed, CBC in AM  CKD (chronic kidney disease), stage III  Appears to be at baseline  Hold lasix and olmesartan and see if kidney function improves  Atrial fibrillation  Continue coumadin and metoprolol 25 mg daily  UTI (lower urinary tract infection)  Start rocephin  Follow up urine culture results BPH  Continue proscar Suspicious findings on CT abd for renal cell carcinoma  Pt aware of the diagnosis and plans to follow up first with his PCP in [place] Thrombocytopenia  Not sure what etiology is but in 12/2011 platelet count 121 and on this admission 127  No signs of bleed  On coumadin  Code Status: Full Family Communication: Pt at bedside Disposition Plan: PT evaluation   Danie [facility] [phone]  Review of Systems:  Constitutional: Negative for fever, chills and malaise/fatigue. Negative for diaphoresis.  HENT: Negative for hearing loss, ear pain, nosebleeds, congestion, sore throat, neck pain, tinnitus and ear discharge.   Eyes: Negative for blurred vision, double vision, photophobia, pain, discharge and redness.  Respiratory: Negative for cough, hemoptysis, sputum production, shortness of breath, wheezing and stridor.   Cardiovascular: Negative for chest pain, palpitations, orthopnea, claudication and leg swelling.  Gastrointestinal: Negative for nausea, vomiting and abdominal pain. Negative for heartburn, constipation, blood in stool and melena.  Genitourinary: Negative for dysuria, urgency, frequency, hematuria and flank pain.  Musculoskeletal: per HPI  Skin: Negative for itching and rash.  Neurological: Negative for tingling, tremors, sensory change, speech change, focal weakness, loss of consciousness and headaches.  Endo/Heme/Allergies: Negative for environmental allergies and polydipsia. Does not bruise/bleed easily.  Psychiatric/Behavioral: Negative for suicidal ideas. The  patient is not nervous/anxious.      Past Medical History  Diagnosis Date  . Atrial  fibrillation     Past Surgical History  Procedure Date  . Pacemaker insertion     Social History:  does not have a smoking history on file. He does not have any smokeless tobacco history on file. He reports that he does not drink alcohol or use illicit drugs.  No Known Allergies  Family history: htn in mother   Prior to Admission medications   Medication Sig Start Date End Date Taking? Authorizing Provider  ALPRAZolam Prudy Feeler) 0.5 MG tablet Take 0.5 mg by mouth daily as needed. For anxiety   Yes Historical Provider, MD  finasteride (PROSCAR) 5 MG tablet Take 5 mg by mouth daily.     Yes Historical Provider, MD  furosemide (LASIX) 40 MG tablet Take 40 mg by mouth daily.     Yes Historical Provider, MD  HYDROcodone-acetaminophen (VICODIN) 5-500 MG per tablet Take 1 tablet by mouth every 4 (four) hours as needed for pain. 12/01/10  Yes Alessandra Bevels. Mayford Knife, PA  levothyroxine (SYNTHROID, LEVOTHROID) 75 MCG tablet Take 75 mcg by mouth daily.     Yes Historical Provider, MD  lovastatin (MEVACOR) 20 MG tablet Take 40 mg by mouth at bedtime.     Yes Historical Provider, MD  metolazone (ZAROXOLYN) 2.5 MG tablet Take 2.5 mg by mouth every Monday, Wednesday, and Friday. As needed for fluid retention.   Yes Historical Provider, MD  metoprolol succinate (TOPROL-XL) 12.5 mg TB24 Take 25 mg by mouth daily. 12/02/10  Yes Alessandra Bevels. Williams, PA  olmesartan (BENICAR) 20 MG tablet Take 20 mg by mouth daily.   Yes Historical Provider, MD  terazosin (HYTRIN) 5 MG capsule Take 5 mg by mouth daily.     Yes Historical Provider, MD  warfarin (COUMADIN) 4 MG tablet Take 4 mg by mouth daily. Hasn't taken since last Thursday due to the anticipation of the CT scan that took place today.  Usually takes daily   Yes Historical Provider, MD  cephALEXin (KEFLEX) 500 MG capsule Take 1 capsule (500 mg total) by mouth 4 (four) times daily. 01/20/12   Fayrene Helper, PA-C    Physical Exam: Filed Vitals:   02/11/12 1704  BP:  129/79  Pulse: 81  Temp: 97.5 F (36.4 C)  TempSrc: Oral  Resp: 20  SpO2: 100%    Physical Exam  Constitutional: Appears in no acute distress HENT: Normocephalic. External right and left ear normal. No tonsillar erythema or exudates Eyes: Conjunctivae and EOM are normal. PERRLA, no scleral icterus.  Neck: Normal ROM. Neck supple. No JVD. No tracheal deviation. No thyromegaly.  CVS: irregular rhythm, rate controlled, S1/S2 +, no murmurs, no gallops, no carotid bruit.  Pulmonary: Effort and breath sounds normal, no stridor, rhonchi, wheezes, rales.  Abdominal: Soft. BS +,  no distension, tenderness, rebound or guarding.  Musculoskeletal: limited range of motion due to pain; no edema.  Lymphadenopathy: No lymphadenopathy noted, cervical, inguinal. Neuro: Alert. No focal neurologic deficits Skin: Skin is warm and dry. No rash noted. Not diaphoretic. No erythema. No pallor.  Psychiatric: Normal mood and affect. Behavior, judgment, thought content normal.   Labs on Admission:  Basic Metabolic Panel:  Lab 02/11/12 1610  NA 139  K 4.0  CL 99  CO2 31  GLUCOSE 130*  BUN 45*  CREATININE 1.91*  CALCIUM 9.4  MG --  PHOS --   CBC:  Lab 02/11/12 1922  WBC 7.2  NEUTROABS 5.1  HGB 10.8*  HCT 32.3*  MCV 96.1  PLT 127*    Radiological Exams on Admission: Ct Lumbar Spine W Contrast 02/10/2012  * IMPRESSION: Transitional anatomy.The dominant abnormality is at L5-S1 where multifactorial spinal stenosis as described above affects the L5 and S1 nerve roots bilaterally.  Similar less severe changes at L2-3, L3-4, and L4-5.   Original Report Authenticated By: Davonna Belling, M.D.    Dg Myelogram Lumbar 02/10/2012  *  IMPRESSION:  The dominant abnormality is at L5-S1 where multifactorial spinal stenosis affects the L5 and S1 nerve roots bilaterally.  Similar less severe changes at L2-3, L3-4, and L4-5.   Original Report Authenticated By: Davonna Belling, M.D.     EKG: atrial  fibrillation  Debbora Presto, MD  [facility] Pager [phone]  If 7PM-7AM, please contact night-coverage [email or web address] Password [facility] 02/11/2012, 9:25 PM

## 2012-02-11 NOTE — ED Notes (Signed)
[id number]<AV> Expected date:<BR> Expected time:<BR> Means of arrival:<BR> Comments:<BR> 82 yom leg pain

## 2012-02-11 NOTE — ED Notes (Signed)
PA at bedside speaking with pt at this time

## 2012-02-11 NOTE — ED Notes (Signed)
Pt aware of need for urine, stated he just went ad cannot void at this time.  Pt stated he has had on-going pain since 12/25 that was r/t to a UTI.  Pt has been cleared from a urologist and states the pani is still increasing to his lower back and groin area, shooting down into bilateral legs.  Pt states any movement causes severe pain but 2/10 at rest.  Pt comfortable at this time.  Pt repositioned in bed and denies further needs.

## 2012-02-11 NOTE — ED Notes (Signed)
When pt flexes feet toward body a sharp pain shoots up legs into lower back and groin.

## 2012-02-11 NOTE — ED Notes (Signed)
Attempted to call report to 5E.  Stated RN is in another pt's room and will return call.

## 2012-02-11 NOTE — Progress Notes (Signed)
ANTICOAGULATION CONSULT NOTE - Initial Consult  Pharmacy Consult for warfarin Indication: atrial fibrillation  No Known Allergies  Patient Measurements:   Heparin Dosing Weight:  Vital Signs: Temp: 97.5 F (36.4 C) (01/16 1704) Temp src: Oral (01/16 1704) BP: 129/79 mmHg (01/16 1704) Pulse Rate: 81  (01/16 1704)  Labs:  [facility] 02/11/12 1922  HGB 10.8*  HCT 32.3*  PLT 127*  APTT --  LABPROT 15.5*  INR 1.25  HEPARINUNFRC --  CREATININE 1.91*  CKTOTAL --  CKMB --  TROPONINI --    The CrCl is unknown because both a height and weight (above a minimum accepted value) are required for this calculation.   Medical History: Past Medical History  Diagnosis Date  . Atrial fibrillation     Medications:  Scheduled:    . [COMPLETED] HYDROcodone-acetaminophen  1 tablet Oral Once  . metoprolol succinate  25 mg Oral Daily   Infusions:    . sodium chloride 75 mL/hr at 02/11/12 2138  . cefTRIAXone (ROCEPHIN)  IV      Assessment:  79 yo male presented to ER with CC of leg pain to be admitted and continue his warfarin for hx of Afib  Note patient has not taken warfarin since 1/9 as per instructions per patient to hold until after CT scan  Home dose reportedly 4mg  once daily  INR subtherapeutic as expected due to holding warfarin  Goal of Therapy:  INR 2-3   Plan:  1) 6mg  warfarin tonight 2) Daily INR   Hessie Knows, PharmD, BCPS Pager [phone] 02/11/2012 9:42 PM

## 2012-02-12 ENCOUNTER — Encounter (HOSPITAL_COMMUNITY): Payer: Self-pay | Admitting: *Deleted

## 2012-02-12 DIAGNOSIS — M4800 Spinal stenosis, site unspecified: Secondary | ICD-10-CM

## 2012-02-12 DIAGNOSIS — R29898 Other symptoms and signs involving the musculoskeletal system: Secondary | ICD-10-CM

## 2012-02-12 LAB — APTT: aPTT: 31 seconds (ref 24–37)

## 2012-02-12 LAB — COMPREHENSIVE METABOLIC PANEL
ALT: 13 U/L (ref 0–53)
BUN: 46 mg/dL — ABNORMAL HIGH (ref 6–23)
CO2: 30 mEq/L (ref 19–32)
Calcium: 9.3 mg/dL (ref 8.4–10.5)
Creatinine, Ser: 2 mg/dL — ABNORMAL HIGH (ref 0.50–1.35)
GFR calc Af Amer: 35 mL/min — ABNORMAL LOW (ref >90)
GFR calc non Af Amer: 30 mL/min — ABNORMAL LOW (ref >90)
Glucose, Bld: 97 mg/dL (ref 70–99)
Total Protein: 5.9 g/dL — ABNORMAL LOW (ref 6.0–8.3)

## 2012-02-12 LAB — PROTIME-INR
INR: 1.29 (ref 0.00–1.49)
Prothrombin Time: 15.8 seconds — ABNORMAL HIGH (ref 11.6–15.2)

## 2012-02-12 LAB — MAGNESIUM: Magnesium: 2.2 mg/dL (ref 1.5–2.5)

## 2012-02-12 LAB — CBC
Hemoglobin: 10.2 g/dL — ABNORMAL LOW (ref 13.0–17.0)
MCHC: 33.4 g/dL (ref 30.0–36.0)
WBC: 6.2 10*3/uL (ref 4.0–10.5)

## 2012-02-12 LAB — PHOSPHORUS: Phosphorus: 3.7 mg/dL (ref 2.3–4.6)

## 2012-02-12 MED ORDER — IRBESARTAN 75 MG PO TABS
75.0000 mg | ORAL_TABLET | Freq: Every day | ORAL | Status: DC
  Administered 2012-02-13 – 2012-02-16 (×4): 75 mg via ORAL
  Filled 2012-02-12 (×4): qty 1

## 2012-02-12 MED ORDER — WARFARIN SODIUM 6 MG PO TABS
6.0000 mg | ORAL_TABLET | Freq: Once | ORAL | Status: DC
  Filled 2012-02-12: qty 1

## 2012-02-12 NOTE — Progress Notes (Signed)
Called report to [place], Charity fundraiser at [employer]. [facility] transporting patient now. Belongings were sent with daughter. Drawers in rooms were checked with daughter.

## 2012-02-12 NOTE — Plan of Care (Signed)
Problem: Phase I Progression Outcomes Goal: OOB as tolerated unless otherwise ordered Outcome: Not Applicable Date Met:  02/12/12 Pt attempted to get patient up but unable; see note

## 2012-02-12 NOTE — Evaluation (Signed)
Physical Therapy Evaluation Patient Details Name: [name] MRN: [id number] DOB: [birth year] Today's Date: 02/12/2012 Time: 1000-1041 PT Time Calculation (min): 41 min  PT Assessment / Plan / Recommendation Clinical Impression  79 yo male admitted with bil LE weakness, falls, back pain (lower back down bil LEs just above knees). Pain began on 12/25 and has progressed per pt. On eval, pt requires Max assist to stand. Unable to attempt pivot/ambulation due to bil knee buckling and HIGH RISK FOR FALLS. Do not feel pt is safe to d/c home. May benefit from neuro consult to address LE weakness. Will follow during stay. Pt may need SNF placement if mobility/pain does not improve.     PT Assessment  Patient needs continued PT services    Follow Up Recommendations  SNF;Supervision/Assistance - 24 hour    Does the patient have the potential to tolerate intense rehabilitation      Barriers to Discharge        Equipment Recommendations  Wheelchair (measurements PT);Wheelchair cushion (measurements PT) (may possibly need to consider)    Recommendations for Other Services OT consult   Frequency Min 3X/week    Precautions / Restrictions Precautions Precautions: Fall Restrictions Weight Bearing Restrictions: No   Pertinent Vitals/Pain 3/10 at rest; increases significantly with upright activity      Mobility  Bed Mobility Bed Mobility: Supine to Sit;Sit to Supine Supine to Sit: 4: Min assist;HOB elevated;With rails Sit to Supine: 4: Min assist;HOB elevated;With rail Details for Bed Mobility Assistance: Assist for trunk to upright/supine and bil LE off/onto bed.  Transfers Transfers: Stand to Sit;Sit to Stand Sit to Stand: From elevated surface;2: Max assist;From bed;With upper extremity assist Stand to Sit: To bed;With upper extremity assist;2: Max assist Details for Transfer Assistance: x 3 at EOB from elevated surface. Heavy reliance on UEs to rise to standing. Episodes of bil LE  buckling during standing attempts. Pt only able to maintain static standing for ~5-10 seconds before LEs buckle. pt unable to maintain full upright trunk posture due to back/LE pain-position of comfort is 45 degrees with bil forearms braced against frame of walker. Unable to safely attempt pivot to chair.  Ambulation/Gait Ambulation/Gait Assistance: Not tested (comment) Ambulation/Gait Assistance Details: Unable to attempt    Shoulder Instructions     Exercises     PT Diagnosis: Difficulty walking;Acute pain;Abnormality of gait  PT Problem List: Decreased strength;Decreased range of motion;Decreased activity tolerance;Decreased mobility;Decreased knowledge of use of DME;Pain PT Treatment Interventions: DME instruction;Gait training;Functional mobility training;Therapeutic activities;Therapeutic exercise;Patient/family education   PT Goals Acute Rehab PT Goals PT Goal Formulation: With patient Time For Goal Achievement: 02/26/12 Potential to Achieve Goals: Good Pt will go Supine/Side to Sit: with supervision PT Goal: Supine/Side to Sit - Progress: Goal set today Pt will go Sit to Supine/Side: with supervision PT Goal: Sit to Supine/Side - Progress: Goal set today Pt will go Sit to Stand: with supervision PT Goal: Sit to Stand - Progress: Goal set today Pt will Transfer Bed to Chair/Chair to Bed: with supervision PT Transfer Goal: Bed to Chair/Chair to Bed - Progress: Goal set today Pt will Ambulate: 16 - 50 feet;with min assist;with rolling walker PT Goal: Ambulate - Progress: Goal set today  Visit Information  Last PT Received On: 02/12/12 Assistance Needed: +2    Subjective Data  Subjective: "I can only get to 45 degrees" Patient Stated Goal: Less pain. Walk   Prior Functioning  Home Living Lives With: Spouse Available Help at Discharge: Family Type of  Home: House Home Access: Stairs to enter [employer] of Steps: 3 Entrance Stairs-Rails: Right Home Layout:  Able to live on main level with bedroom/bathroom;Two level Home Adaptive Equipment: Walker - rolling;Straight cane Prior Function Level of Independence: Needs assistance Needs Assistance: Bathing;Dressing;Grooming;Gait;Transfers;Light Housekeeping;Meal Prep;Toileting Meal Prep: Total Light Housekeeping: Total Communication Communication: HOH    Cognition  Overall Cognitive Status: Appears within functional limits for tasks assessed/performed Arousal/Alertness: Awake/alert Orientation Level: Appears intact for tasks assessed Behavior During Session: [facility] for tasks performed    Extremity/Trunk Assessment Right Lower Extremity Assessment RLE ROM/Strength/Tone: Deficits RLE ROM/Strength/Tone Deficits: Strength at least 3/5 throughout however pt unable to maintain sustained contraction against gravity for longer than a couple of seconds. Muscle just "gives way".  RLE Sensation: WFL - Light Touch Left Lower Extremity Assessment LLE ROM/Strength/Tone: Deficits LLE ROM/Strength/Tone Deficits: Strength at least 3/5 throughout however pt unable to maintain sustained contraction against gravity for longer than a couple of seconds. Muscle just "gives way".  LLE Sensation: WFL - Light Touch   Balance    End of Session PT - End of Session Equipment Utilized During Treatment: Gait belt Activity Tolerance: Patient limited by pain;Patient limited by fatigue Patient left: with call bell/phone within reach;with family/visitor present;with nursing in room  GP     Rebeca Alert [facility] 02/12/2012, 10:59 AM [phone]

## 2012-02-12 NOTE — Progress Notes (Signed)
ANTICOAGULATION CONSULT NOTE   Pharmacy Consult for warfarin Indication: atrial fibrillation  No Known Allergies  Patient Measurements: Height: 5\' 11"  (180.3 cm) Weight: 236 lb 15.9 oz (107.5 kg) IBW/kg (Calculated) : 75.3  Heparin Dosing Weight:  Vital Signs: Temp: 97.2 F (36.2 C) (01/16 2245) Temp src: Oral (01/16 2245) BP: 116/58 mmHg (01/16 2340) Pulse Rate: 84  (01/16 2245)  Labs:  Basename 02/12/12 0511 02/11/12 2300 02/11/12 1922  HGB 10.2* -- 10.8*  HCT 30.5* -- 32.3*  PLT 120* -- 127*  APTT -- 31 --  LABPROT 15.8* 15.6* 15.5*  INR 1.29 1.27 1.25  HEPARINUNFRC -- -- --  CREATININE 2.00* -- 1.91*  CKTOTAL -- -- --  CKMB -- -- --  TROPONINI -- -- --    Estimated Creatinine Clearance: 37.4 ml/min (by C-G formula based on Cr of 2).   Medical History: Past Medical History  Diagnosis Date  . Atrial fibrillation     Medications:  Scheduled:     . cefTRIAXone (ROCEPHIN)  IV  1 g Intravenous Q24H  . finasteride  5 mg Oral Daily  . [COMPLETED] HYDROcodone-acetaminophen  1 tablet Oral Once  . irbesartan  150 mg Oral Daily  . levothyroxine  75 mcg Oral QAC breakfast  . metolazone  2.5 mg Oral Q M,W,F  . metoprolol succinate  12.5 mg Oral Daily  . simvastatin  10 mg Oral q1800  . sodium chloride  3 mL Intravenous Q12H  . terazosin  5 mg Oral Daily  . [COMPLETED] warfarin  6 mg Oral Once  . Warfarin - Pharmacist Dosing Inpatient   Does not apply q1800  . [DISCONTINUED] warfarin  4 mg Oral Daily   Infusions:     . sodium chloride 75 mL/hr at 02/11/12 2138   Warfarin doses administered this admission:  1/16 - 6mg   Assessment:  79 yo male presented to ER with CC of leg weakness. Admitted, to continue warfarin for hx of Afib  Note patient had not taken warfarin since 1/9 as per instructions per patient to hold until after CT scan  Home warfarin dose reportedly 4mg  once daily  INR subtherapeutic as expected due to holding warfarin  PTA  Re-initiation of warfarin in progress  Goal of Therapy:  INR 2-3   Plan:  1) Repeat warfarin 6mg  PO x 1 at 6pm 2) Daily INR while inpatient   Elie Goody, PharmD, BCPS Pager: [phone] 02/12/2012  7:28 AM

## 2012-02-12 NOTE — Progress Notes (Addendum)
[facility]             Progress Note   Subjective: Unable to lay supine and unable to stand due to severe pain, almost fell when tried with PT this am  Objective: Vital signs in last 24 hours: Temp:  [97.1 F (36.2 C)-97.5 F (36.4 C)] 97.1 F (36.2 C) (01/17 0700) Pulse Rate:  [75-84] 75  (01/17 0730) Resp:  [18-20] 18  (01/17 0730) BP: (103-129)/(58-79) 103/64 mmHg (01/17 0730) SpO2:  [96 %-100 %] 96 % (01/17 0730) Weight:  [107.5 kg (236 lb 15.9 oz)] 107.5 kg (236 lb 15.9 oz) (01/16 2245) Weight change:  Last BM Date: 02/10/12  Intake/Output from previous day: 01/16 0701 - 01/17 0700 In: 752.5 [I.V.:702.5; IV Piggyback:50] Out: 700 [Urine:700] Total I/O In: 240 [P.O.:240] Out: 500 [Urine:500]   Physical Exam: General: Alert, awake, oriented x3, unable to lay supine due to severe pain HEENT: No bruits, no goiter. Heart: Regular rate and rhythm, without murmurs, rubs, gallops. Lungs: Clear to auscultation bilaterally. Abdomen: Soft, nontender, nondistended, positive bowel sounds. Extremities: No clubbing cyanosis or edema with positive pedal pulses. Neuro: motor 4/5 in both lower ext, sensations -light touch diminished on R DTR 1 plus, plantars mute    Lab Results: Basic Metabolic Panel:  Basename 02/12/12 0511 02/11/12 2300 02/11/12 1922  NA 143 -- 139  K 3.6 -- 4.0  CL 104 -- 99  CO2 30 -- 31  GLUCOSE 97 -- 130*  BUN 46* -- 45*  CREATININE 2.00* -- 1.91*  CALCIUM 9.3 -- 9.4  MG -- 2.2 --  PHOS -- 3.7 --   Liver Function Tests:  Basename 02/12/12 0511  AST 22  ALT 13  ALKPHOS 45  BILITOT 0.3  PROT 5.9*  ALBUMIN 2.9*   No results found for this basename: LIPASE:2,AMYLASE:2 in the last 72 hours No results found for this basename: AMMONIA:2 in the last 72 hours CBC:  Basename 02/12/12 0511 02/11/12 1922  WBC 6.2 7.2  NEUTROABS -- 5.1  HGB 10.2* 10.8*  HCT 30.5* 32.3*  MCV 95.6 96.1  PLT 120* 127*   Cardiac Enzymes: No  results found for this basename: CKTOTAL:3,CKMB:3,CKMBINDEX:3,TROPONINI:3 in the last 72 hours BNP: No results found for this basename: PROBNP:3 in the last 72 hours D-Dimer: No results found for this basename: DDIMER:2 in the last 72 hours CBG:  Basename 02/12/12 0729  GLUCAP 94   Hemoglobin A1C: No results found for this basename: HGBA1C in the last 72 hours Fasting Lipid Panel: No results found for this basename: CHOL,HDL,LDLCALC,TRIG,CHOLHDL,LDLDIRECT in the last 72 hours Thyroid Function Tests: No results found for this basename: TSH,T4TOTAL,FREET4,T3FREE,THYROIDAB in the last 72 hours Anemia Panel: No results found for this basename: VITAMINB12,FOLATE,FERRITIN,TIBC,IRON,RETICCTPCT in the last 72 hours Coagulation:  Basename 02/12/12 0511 02/11/12 2300  LABPROT 15.8* 15.6*  INR 1.29 1.27   Urine Drug Screen: Drugs of Abuse  No results found for this basename: labopia, cocainscrnur, labbenz, amphetmu, thcu, labbarb    Alcohol Level: No results found for this basename: ETH:2 in the last 72 hours Urinalysis:  Basename 02/11/12 2015  COLORURINE YELLOW  LABSPEC 1.010  PHURINE 5.0  GLUCOSEU NEGATIVE  HGBUR NEGATIVE  BILIRUBINUR NEGATIVE  KETONESUR NEGATIVE  PROTEINUR NEGATIVE  UROBILINOGEN 0.2  NITRITE NEGATIVE  LEUKOCYTESUR NEGATIVE    No results found for this or any previous visit (from the past 240 hour(s)).  Studies/Results: Ct Lumbar Spine W Contrast  02/10/2012  *RADIOLOGY REPORT*  MYELOGRAM INJECTION  Technique:  Informed  consent was obtained from the patient prior to the procedure, including potential complications of headache, allergy, infection and pain. Specific instructions were given regarding 24 hour bedrest post procedure to prevent post-LP headache.  The patient's prothrombin time was checked and found to be within limits for safe performance of the invasive procedure.  A timeout procedure was performed.  With the patient prone, the lower back was  prepped with Betadine.  1% Lidocaine was used for local anesthesia.  Lumbar puncture was performed by the radiologist at the L5-S1 level using a 22 gauge needle with return of clear CSF. I personally performed the lumbar puncture and administered the intrathecal contrast. I also personally supervised acquisition of the myelogram images. 15 cc of Omnipaque 180 was injected into the subarachnoid space .  IMPRESSION: Successful injection of  intrathecal contrast for myelography.  MYELOGRAM LUMBAR  Clinical Data:  Low back pain. Bilateral leg pain.  Comparison: None.  Findings: Good opacification of the lumbar subarachnoid space. Waist like narrowing at L2-3, L3-4, L4-5, and L5-S1 affects the exiting nerve roots at these levels. The changes are worst at L2-3 on the right, but relatively symmetric at the lower levels.  There is advanced disc space narrowing at L4-5 and L5-S1.  There is fusion anteriorly at L2-3 and L3-4.  Moderate anterior spurring at L1-2 likely results in functional fusion.  Transitional anatomy is present with a narrow S1-S2 disc space which is also fused.  The patient was not able to cooperate for standing flexion/extension views.  Fluoroscopy Time: 49 seconds  IMPRESSION: As above.  CT MYELOGRAPHY LUMBAR SPINE  Technique: Multidetector CT imaging of the lumbar spine was performed following myelography.  Multiplanar CT image reconstructions were also generated.  Findings:  Moderate atheromatous change of the aorta is nonaneurysmal.  No visible paravertebral masses.  Conus slightly low ending midbody L2.  L1-2: Mild facet arthropathy.  Mild annular bulging.  Anterior spurring results in functional fusion.  No neural compression.  L2-3: Calcified central and rightward protrusion with posterior element hypertrophy. Moderate central canal stenosis with right greater than left L3 nerve root encroachment.  Neural foraminal narrowing is unimpressive.  L3-4: Solid interbody fusion.  Slight regrowth of  posterior laminectomy. Moderate facet overgrowth.  Solid posterolateral fusion.  Mild central canal stenosis.  Bilateral L4 nerve root encroachment in the lateral recess. Mild neural foraminal narrowing without clear-cut L3 nerve root encroachment.  L4-5: Solid interbody fusion.  Solid posterolateral fusion. Regrowth of posterior elements status post laminectomy.  Moderate facet overgrowth.  Moderate central canal stenosis with bilateral L5 nerve root encroachment.  Calcified central protrusion. Moderate neural foramen narrowing on the right could affect the L4 nerve root.  L5-S1: Advanced disc space narrowing with vacuum phenomenon. Shallow central protrusion.  Regrowth of posterior laminectomy. Moderate facet overgrowth.  Severe spinal stenosis with bilateral S1 nerve root encroachment.  In addition, there is bilateral neural foraminal narrowing affecting both L5 nerve roots.  IMPRESSION: Transitional anatomy.  See discussion above.  The dominant abnormality is at L5-S1 where multifactorial spinal stenosis as described above affects the L5 and S1 nerve roots bilaterally.  Similar less severe changes at L2-3, L3-4, and L4-5.   Original Report Authenticated By: Davonna Belling, M.D.    Dg Myelogram Lumbar  02/10/2012  *RADIOLOGY REPORT*  MYELOGRAM INJECTION  Technique:  Informed consent was obtained from the patient prior to the procedure, including potential complications of headache, allergy, infection and pain. Specific instructions were given regarding 24 hour bedrest post procedure  to prevent post-LP headache.  The patient's prothrombin time was checked and found to be within limits for safe performance of the invasive procedure.  A timeout procedure was performed.  With the patient prone, the lower back was prepped with Betadine.  1% Lidocaine was used for local anesthesia.  Lumbar puncture was performed by the radiologist at the L5-S1 level using a 22 gauge needle with return of clear CSF. I personally  performed the lumbar puncture and administered the intrathecal contrast. I also personally supervised acquisition of the myelogram images. 15 cc of Omnipaque 180 was injected into the subarachnoid space .  IMPRESSION: Successful injection of  intrathecal contrast for myelography.  MYELOGRAM LUMBAR  Clinical Data:  Low back pain. Bilateral leg pain.  Comparison: None.  Findings: Good opacification of the lumbar subarachnoid space. Waist like narrowing at L2-3, L3-4, L4-5, and L5-S1 affects the exiting nerve roots at these levels. The changes are worst at L2-3 on the right, but relatively symmetric at the lower levels.  There is advanced disc space narrowing at L4-5 and L5-S1.  There is fusion anteriorly at L2-3 and L3-4.  Moderate anterior spurring at L1-2 likely results in functional fusion.  Transitional anatomy is present with a narrow S1-S2 disc space which is also fused.  The patient was not able to cooperate for standing flexion/extension views.  Fluoroscopy Time: 49 seconds  IMPRESSION: As above.  CT MYELOGRAPHY LUMBAR SPINE  Technique: Multidetector CT imaging of the lumbar spine was performed following myelography.  Multiplanar CT image reconstructions were also generated.  Findings:  Moderate atheromatous change of the aorta is nonaneurysmal.  No visible paravertebral masses.  Conus slightly low ending midbody L2.  L1-2: Mild facet arthropathy.  Mild annular bulging.  Anterior spurring results in functional fusion.  No neural compression.  L2-3: Calcified central and rightward protrusion with posterior element hypertrophy. Moderate central canal stenosis with right greater than left L3 nerve root encroachment.  Neural foraminal narrowing is unimpressive.  L3-4: Solid interbody fusion.  Slight regrowth of posterior laminectomy. Moderate facet overgrowth.  Solid posterolateral fusion.  Mild central canal stenosis.  Bilateral L4 nerve root encroachment in the lateral recess. Mild neural foraminal narrowing  without clear-cut L3 nerve root encroachment.  L4-5: Solid interbody fusion.  Solid posterolateral fusion. Regrowth of posterior elements status post laminectomy.  Moderate facet overgrowth.  Moderate central canal stenosis with bilateral L5 nerve root encroachment.  Calcified central protrusion. Moderate neural foramen narrowing on the right could affect the L4 nerve root.  L5-S1: Advanced disc space narrowing with vacuum phenomenon. Shallow central protrusion.  Regrowth of posterior laminectomy. Moderate facet overgrowth.  Severe spinal stenosis with bilateral S1 nerve root encroachment.  In addition, there is bilateral neural foraminal narrowing affecting both L5 nerve roots.  IMPRESSION: Transitional anatomy.  See discussion above.  The dominant abnormality is at L5-S1 where multifactorial spinal stenosis as described above affects the L5 and S1 nerve roots bilaterally.  Similar less severe changes at L2-3, L3-4, and L4-5.   Original Report Authenticated By: Davonna Belling, M.D.     Medications: Scheduled Meds:   . finasteride  5 mg Oral Daily  . irbesartan  150 mg Oral Daily  . levothyroxine  75 mcg Oral QAC breakfast  . metolazone  2.5 mg Oral Q M,W,F  . metoprolol succinate  12.5 mg Oral Daily  . simvastatin  10 mg Oral q1800  . sodium chloride  3 mL Intravenous Q12H  . terazosin  5 mg Oral Daily  .  warfarin  6 mg Oral ONCE-1800  . Warfarin - Pharmacist Dosing Inpatient   Does not apply q1800   Continuous Infusions:   . sodium chloride 75 mL/hr at 02/11/12 2138   PRN Meds:.acetaminophen, acetaminophen, ALPRAZolam, HYDROcodone-acetaminophen, HYDROmorphone (DILAUDID) injection, ondansetron (ZOFRAN) IV, ondansetron  Assessment/Plan: 1. Severe L5S1 spinal stenosis with weakness, inability to ambulate and multiple falls  COntacted Dr.WongWater engineer), he is unable to do any procedure here due to lack of privileges, D/W IR (Dr.Curnes), they dont think he is a candidate for cortisone  injection due to prior lumbar fusion and lack of epidural space. Requested Spine Surgery consult, D/w Dr.Jones, options limited, will [facility] to [facility] for [facility]G evaluation PT evaluation-completed this am, did very poorly due to pain, weakness, unable to stand.  HTN (hypertension)  Stable, continue avapro and toprol  Anemia of chronic disease  Stable  CKD (chronic kidney disease), stage III  Appears to be at baseline  Stop IVF Atrial fibrillation  Continue  metoprolol 25 mg daily, continue to hold coumadin incase of potential procedure/surgery, this has been on hold since Wednesday for CT myelogram BPH  Continue proscar Suspicious findings on CT abd for renal cell carcinoma  Pt aware of the diagnosis and plans to follow up first with his PCP in [place], will need further workup once acute issues addressed Thrombocytopenia  Chronic stable  Code Status: Full  Family Communication: discussed with pt, wife and daughter at bedside  Disposition Plan: pending improvement in clinical status     Time spent coordinating care:   LOS: 1 day   [facility] Pager: [phone] 02/12/2012, 10:53 AM

## 2012-02-12 NOTE — ED Provider Notes (Signed)
Medical screening examination/treatment/procedure(s) were performed by non-physician practitioner and as supervising physician I was immediately available for consultation/collaboration.    Nelia Shi, MD 02/12/12 2031

## 2012-02-12 NOTE — Consult Note (Signed)
Reason for Consult: low back pain and freq falls Referring Physician: Dr. Littie Deeds Hyslop is an 79 y.o. male.  HPI: Pt is a 79 year old male with past medical history of severe arthritis and status post right hip replacement, Atrial fibrillation (status post pacemaker and on coumadin), HTN who presented to ED for evaluation of bilateral lower extremity weakness and frequent falls. Patient reports a few week history of lower back pain which is aggravated by standing and somewhat relieved with sitting. When he stands from a seated position, he experiences severe low back pain that radiates to groin, down legs to knees. Every time he has falling it is been after experiencing this pain, upon the first few steps of ambulation. He reports 5-6 falls. No associated urinary or rectal incontinence, no loss of sensation.Denies saddle paresthesias.  Patient reports no complaints of chest pain, no shortness of breath, no palpitations, no dizziness or lightheadedness. No abdominal pain, no nausea or vomiting. Prior to admission he was evaluated at [facility] by Dr.Wang with a CT myelogram significant for spinal stenosis at L5-S1. Plan was for possible injection therapy.   Past Medical History  Diagnosis Date  . Atrial fibrillation     Past Surgical History  Procedure Date  . Pacemaker insertion     No family history on file.  Social History:  reports that he has never smoked. He does not have any smokeless tobacco history on file. He reports that he does not drink alcohol or use illicit drugs.  Allergies: No Known Allergies  Medications: I have reviewed the patient's current medications.  Results for orders placed during the hospital encounter of 02/11/12 (from the past 48 hour(s))  CBC WITH DIFFERENTIAL     Status: Abnormal   Collection Time   02/11/12  7:22 PM      Component Value Range Comment   WBC 7.2  4.0 - 10.5 K/uL    RBC 3.36 (*) 4.22 - 5.81 MIL/uL    Hemoglobin 10.8  (*) 13.0 - 17.0 g/dL    HCT 62.1 (*) 30.8 - 52.0 %    MCV 96.1  78.0 - 100.0 fL    MCH 32.1  26.0 - 34.0 pg    MCHC 33.4  30.0 - 36.0 g/dL    RDW 65.7  84.6 - 96.2 %    Platelets 127 (*) 150 - 400 K/uL    Neutrophils Relative 70  43 - 77 %    Neutro Abs 5.1  1.7 - 7.7 K/uL    Lymphocytes Relative 16  12 - 46 %    Lymphs Abs 1.1  0.7 - 4.0 K/uL    Monocytes Relative 13 (*) 3 - 12 %    Monocytes Absolute 0.9  0.1 - 1.0 K/uL    Eosinophils Relative 1  0 - 5 %    Eosinophils Absolute 0.0  0.0 - 0.7 K/uL    Basophils Relative 0  0 - 1 %    Basophils Absolute 0.0  0.0 - 0.1 K/uL   BASIC METABOLIC PANEL     Status: Abnormal   Collection Time   02/11/12  7:22 PM      Component Value Range Comment   Sodium 139  135 - 145 mEq/L    Potassium 4.0  3.5 - 5.1 mEq/L    Chloride 99  96 - 112 mEq/L    CO2 31  19 - 32 mEq/L    Glucose, Bld 130 (*) 70 - 99  mg/dL    BUN 45 (*) 6 - 23 mg/dL    Creatinine, Ser 1.61 (*) 0.50 - 1.35 mg/dL    Calcium 9.4  8.4 - 09.6 mg/dL    GFR calc non Af Amer 32 (*) >90 mL/min    GFR calc Af Amer 37 (*) >90 mL/min   PROTIME-INR     Status: Abnormal   Collection Time   02/11/12  7:22 PM      Component Value Range Comment   Prothrombin Time 15.5 (*) 11.6 - 15.2 seconds    INR 1.25  0.00 - 1.49   URINALYSIS, ROUTINE W REFLEX MICROSCOPIC     Status: Normal   Collection Time   02/11/12  8:15 PM      Component Value Range Comment   Color, Urine YELLOW  YELLOW    APPearance CLEAR  CLEAR    Specific Gravity, Urine 1.010  1.005 - 1.030    pH 5.0  5.0 - 8.0    Glucose, UA NEGATIVE  NEGATIVE mg/dL    Hgb urine dipstick NEGATIVE  NEGATIVE    Bilirubin Urine NEGATIVE  NEGATIVE    Ketones, ur NEGATIVE  NEGATIVE mg/dL    Protein, ur NEGATIVE  NEGATIVE mg/dL    Urobilinogen, UA 0.2  0.0 - 1.0 mg/dL    Nitrite NEGATIVE  NEGATIVE    Leukocytes, UA NEGATIVE  NEGATIVE MICROSCOPIC NOT DONE ON URINES WITH NEGATIVE PROTEIN, BLOOD, LEUKOCYTES, NITRITE, OR GLUCOSE <1000 mg/dL.    MAGNESIUM     Status: Normal   Collection Time   02/11/12 11:00 PM      Component Value Range Comment   Magnesium 2.2  1.5 - 2.5 mg/dL   PHOSPHORUS     Status: Normal   Collection Time   02/11/12 11:00 PM      Component Value Range Comment   Phosphorus 3.7  2.3 - 4.6 mg/dL   APTT     Status: Normal   Collection Time   02/11/12 11:00 PM      Component Value Range Comment   aPTT 31  24 - 37 seconds   PROTIME-INR     Status: Abnormal   Collection Time   02/11/12 11:00 PM      Component Value Range Comment   Prothrombin Time 15.6 (*) 11.6 - 15.2 seconds    INR 1.27  0.00 - 1.49   PROTIME-INR     Status: Abnormal   Collection Time   02/12/12  5:11 AM      Component Value Range Comment   Prothrombin Time 15.8 (*) 11.6 - 15.2 seconds    INR 1.29  0.00 - 1.49   COMPREHENSIVE METABOLIC PANEL     Status: Abnormal   Collection Time   02/12/12  5:11 AM      Component Value Range Comment   Sodium 143  135 - 145 mEq/L    Potassium 3.6  3.5 - 5.1 mEq/L    Chloride 104  96 - 112 mEq/L    CO2 30  19 - 32 mEq/L    Glucose, Bld 97  70 - 99 mg/dL    BUN 46 (*) 6 - 23 mg/dL    Creatinine, Ser 0.45 (*) 0.50 - 1.35 mg/dL    Calcium 9.3  8.4 - 40.9 mg/dL    Total Protein 5.9 (*) 6.0 - 8.3 g/dL    Albumin 2.9 (*) 3.5 - 5.2 g/dL    AST 22  0 - 37 U/L  ALT 13  0 - 53 U/L    Alkaline Phosphatase 45  39 - 117 U/L    Total Bilirubin 0.3  0.3 - 1.2 mg/dL    GFR calc non Af Amer 30 (*) >90 mL/min    GFR calc Af Amer 35 (*) >90 mL/min   CBC     Status: Abnormal   Collection Time   02/12/12  5:11 AM      Component Value Range Comment   WBC 6.2  4.0 - 10.5 K/uL    RBC 3.19 (*) 4.22 - 5.81 MIL/uL    Hemoglobin 10.2 (*) 13.0 - 17.0 g/dL    HCT 45.4 (*) 09.8 - 52.0 %    MCV 95.6  78.0 - 100.0 fL    MCH 32.0  26.0 - 34.0 pg    MCHC 33.4  30.0 - 36.0 g/dL    RDW 11.9  14.7 - 82.9 %    Platelets 120 (*) 150 - 400 K/uL   GLUCOSE, CAPILLARY     Status: Normal   Collection Time   02/12/12  7:29 AM       Component Value Range Comment   Glucose-Capillary 94  70 - 99 mg/dL     Ct Lumbar Spine W Contrast  02/10/2012  *RADIOLOGY REPORT*  MYELOGRAM INJECTION  Technique:  Informed consent was obtained from the patient prior to the procedure, including potential complications of headache, allergy, infection and pain. Specific instructions were given regarding 24 hour bedrest post procedure to prevent post-LP headache.  The patient's prothrombin time was checked and found to be within limits for safe performance of the invasive procedure.  A timeout procedure was performed.  With the patient prone, the lower back was prepped with Betadine.  1% Lidocaine was used for local anesthesia.  Lumbar puncture was performed by the radiologist at the L5-S1 level using a 22 gauge needle with return of clear CSF. I personally performed the lumbar puncture and administered the intrathecal contrast. I also personally supervised acquisition of the myelogram images. 15 cc of Omnipaque 180 was injected into the subarachnoid space .  IMPRESSION: Successful injection of  intrathecal contrast for myelography.  MYELOGRAM LUMBAR  Clinical Data:  Low back pain. Bilateral leg pain.  Comparison: None.  Findings: Good opacification of the lumbar subarachnoid space. Waist like narrowing at L2-3, L3-4, L4-5, and L5-S1 affects the exiting nerve roots at these levels. The changes are worst at L2-3 on the right, but relatively symmetric at the lower levels.  There is advanced disc space narrowing at L4-5 and L5-S1.  There is fusion anteriorly at L2-3 and L3-4.  Moderate anterior spurring at L1-2 likely results in functional fusion.  Transitional anatomy is present with a narrow S1-S2 disc space which is also fused.  The patient was not able to cooperate for standing flexion/extension views.  Fluoroscopy Time: 49 seconds  IMPRESSION: As above.  CT MYELOGRAPHY LUMBAR SPINE  Technique: Multidetector CT imaging of the lumbar spine was performed  following myelography.  Multiplanar CT image reconstructions were also generated.  Findings:  Moderate atheromatous change of the aorta is nonaneurysmal.  No visible paravertebral masses.  Conus slightly low ending midbody L2.  L1-2: Mild facet arthropathy.  Mild annular bulging.  Anterior spurring results in functional fusion.  No neural compression.  L2-3: Calcified central and rightward protrusion with posterior element hypertrophy. Moderate central canal stenosis with right greater than left L3 nerve root encroachment.  Neural foraminal narrowing is unimpressive.  L3-4: Solid  interbody fusion.  Slight regrowth of posterior laminectomy. Moderate facet overgrowth.  Solid posterolateral fusion.  Mild central canal stenosis.  Bilateral L4 nerve root encroachment in the lateral recess. Mild neural foraminal narrowing without clear-cut L3 nerve root encroachment.  L4-5: Solid interbody fusion.  Solid posterolateral fusion. Regrowth of posterior elements status post laminectomy.  Moderate facet overgrowth.  Moderate central canal stenosis with bilateral L5 nerve root encroachment.  Calcified central protrusion. Moderate neural foramen narrowing on the right could affect the L4 nerve root.  L5-S1: Advanced disc space narrowing with vacuum phenomenon. Shallow central protrusion.  Regrowth of posterior laminectomy. Moderate facet overgrowth.  Severe spinal stenosis with bilateral S1 nerve root encroachment.  In addition, there is bilateral neural foraminal narrowing affecting both L5 nerve roots.  IMPRESSION: Transitional anatomy.  See discussion above.  The dominant abnormality is at L5-S1 where multifactorial spinal stenosis as described above affects the L5 and S1 nerve roots bilaterally.  Similar less severe changes at L2-3, L3-4, and L4-5.   Original Report Authenticated By: Davonna Belling, M.D.    Dg Myelogram Lumbar  02/10/2012  *RADIOLOGY REPORT*  MYELOGRAM INJECTION  Technique:  Informed consent was obtained from  the patient prior to the procedure, including potential complications of headache, allergy, infection and pain. Specific instructions were given regarding 24 hour bedrest post procedure to prevent post-LP headache.  The patient's prothrombin time was checked and found to be within limits for safe performance of the invasive procedure.  A timeout procedure was performed.  With the patient prone, the lower back was prepped with Betadine.  1% Lidocaine was used for local anesthesia.  Lumbar puncture was performed by the radiologist at the L5-S1 level using a 22 gauge needle with return of clear CSF. I personally performed the lumbar puncture and administered the intrathecal contrast. I also personally supervised acquisition of the myelogram images. 15 cc of Omnipaque 180 was injected into the subarachnoid space .  IMPRESSION: Successful injection of  intrathecal contrast for myelography.  MYELOGRAM LUMBAR  Clinical Data:  Low back pain. Bilateral leg pain.  Comparison: None.  Findings: Good opacification of the lumbar subarachnoid space. Waist like narrowing at L2-3, L3-4, L4-5, and L5-S1 affects the exiting nerve roots at these levels. The changes are worst at L2-3 on the right, but relatively symmetric at the lower levels.  There is advanced disc space narrowing at L4-5 and L5-S1.  There is fusion anteriorly at L2-3 and L3-4.  Moderate anterior spurring at L1-2 likely results in functional fusion.  Transitional anatomy is present with a narrow S1-S2 disc space which is also fused.  The patient was not able to cooperate for standing flexion/extension views.  Fluoroscopy Time: 49 seconds  IMPRESSION: As above.  CT MYELOGRAPHY LUMBAR SPINE  Technique: Multidetector CT imaging of the lumbar spine was performed following myelography.  Multiplanar CT image reconstructions were also generated.  Findings:  Moderate atheromatous change of the aorta is nonaneurysmal.  No visible paravertebral masses.  Conus slightly low ending  midbody L2.  L1-2: Mild facet arthropathy.  Mild annular bulging.  Anterior spurring results in functional fusion.  No neural compression.  L2-3: Calcified central and rightward protrusion with posterior element hypertrophy. Moderate central canal stenosis with right greater than left L3 nerve root encroachment.  Neural foraminal narrowing is unimpressive.  L3-4: Solid interbody fusion.  Slight regrowth of posterior laminectomy. Moderate facet overgrowth.  Solid posterolateral fusion.  Mild central canal stenosis.  Bilateral L4 nerve root encroachment in the lateral  recess. Mild neural foraminal narrowing without clear-cut L3 nerve root encroachment.  L4-5: Solid interbody fusion.  Solid posterolateral fusion. Regrowth of posterior elements status post laminectomy.  Moderate facet overgrowth.  Moderate central canal stenosis with bilateral L5 nerve root encroachment.  Calcified central protrusion. Moderate neural foramen narrowing on the right could affect the L4 nerve root.  L5-S1: Advanced disc space narrowing with vacuum phenomenon. Shallow central protrusion.  Regrowth of posterior laminectomy. Moderate facet overgrowth.  Severe spinal stenosis with bilateral S1 nerve root encroachment.  In addition, there is bilateral neural foraminal narrowing affecting both L5 nerve roots.  IMPRESSION: Transitional anatomy.  See discussion above.  The dominant abnormality is at L5-S1 where multifactorial spinal stenosis as described above affects the L5 and S1 nerve roots bilaterally.  Similar less severe changes at L2-3, L3-4, and L4-5.   Original Report Authenticated By: Davonna Belling, M.D.     Review of Systems  Musculoskeletal: Positive for back pain.  All other systems reviewed and are negative.   Blood pressure 103/64, pulse 75, temperature 97.1 F (36.2 C), temperature source Oral, resp. rate 18, height 5\' 11"  (1.803 m), weight 107.5 kg (236 lb 15.9 oz), SpO2 96.00%. Physical Exam  Constitutional: He is  oriented to person, place, and time. He appears well-developed and well-nourished.  HENT:  Head: Normocephalic and atraumatic.  Eyes: EOM are normal.  Cardiovascular: Normal rate.   Respiratory: Effort normal and breath sounds normal.  Musculoskeletal:       Full ROM of hip, knees, ankles 5/5 strength hip flexion, knee flexion/ext, ankle flex/ext Sensation intact to touch distally TTP lumbar spine Well healed incision along lumbar spine, appears bengin  Neurological: He is alert and oriented to person, place, and time.  Skin: Skin is warm and dry.  Psychiatric: He has a normal mood and affect. His behavior is normal. Judgment and thought content normal.    Assessment/Plan: Low back pain and frequent falls with history of CT significant for spinal stenosis affecting L5-S1 nerve roots. Difficult to tell if falls are secondary to weakness or pain. No weakness on exam today. Should work with PT today for assessment and to learn safe ambulation with walker Pain control per primary team Will follow up with Dr. Regino Schultze, [facility] for outpatient evaluation and treatment, will determine with if conservative treatment with spinal cortisone injections vs surgery is needed. Will try to get him appointment ASAP Weight bearing as tolerated   Rontae Inglett 02/12/2012, 8:11 AM

## 2012-02-13 ENCOUNTER — Encounter (HOSPITAL_COMMUNITY): Payer: Self-pay | Admitting: Neurological Surgery

## 2012-02-13 ENCOUNTER — Inpatient Hospital Stay (HOSPITAL_COMMUNITY): Payer: Medicare Other

## 2012-02-13 DIAGNOSIS — K5900 Constipation, unspecified: Secondary | ICD-10-CM

## 2012-02-13 LAB — GLUCOSE, CAPILLARY: Glucose-Capillary: 93 mg/dL (ref 70–99)

## 2012-02-13 LAB — SEDIMENTATION RATE: Sed Rate: 45 mm/hr — ABNORMAL HIGH (ref 0–16)

## 2012-02-13 MED ORDER — ACETAMINOPHEN 10 MG/ML IV SOLN
1000.0000 mg | Freq: Four times a day (QID) | INTRAVENOUS | Status: DC
  Administered 2012-02-13 (×3): 1000 mg via INTRAVENOUS
  Filled 2012-02-13 (×4): qty 100

## 2012-02-13 MED ORDER — POLYETHYLENE GLYCOL 3350 17 G PO PACK
17.0000 g | PACK | Freq: Two times a day (BID) | ORAL | Status: DC
  Administered 2012-02-13 – 2012-02-16 (×5): 17 g via ORAL
  Filled 2012-02-13 (×8): qty 1

## 2012-02-13 MED ORDER — IOHEXOL 300 MG/ML  SOLN
10.0000 mL | Freq: Once | INTRAMUSCULAR | Status: AC | PRN
  Administered 2012-02-13: 10 mL via INTRATHECAL

## 2012-02-13 MED ORDER — PANTOPRAZOLE SODIUM 40 MG PO TBEC
40.0000 mg | DELAYED_RELEASE_TABLET | Freq: Every day | ORAL | Status: DC
  Administered 2012-02-13 – 2012-02-16 (×4): 40 mg via ORAL
  Filled 2012-02-13 (×3): qty 1

## 2012-02-13 MED ORDER — DEXAMETHASONE SODIUM PHOSPHATE 4 MG/ML IJ SOLN
4.0000 mg | Freq: Four times a day (QID) | INTRAMUSCULAR | Status: DC
  Administered 2012-02-13 (×3): 4 mg via INTRAVENOUS
  Filled 2012-02-13 (×4): qty 1

## 2012-02-13 MED ORDER — DOCUSATE SODIUM 50 MG PO CAPS
50.0000 mg | ORAL_CAPSULE | Freq: Two times a day (BID) | ORAL | Status: DC
  Administered 2012-02-13 – 2012-02-14 (×2): 50 mg via ORAL
  Filled 2012-02-13 (×4): qty 1

## 2012-02-13 MED ORDER — OXYCODONE HCL 5 MG PO TABS: 10.0000 mg | ORAL_TABLET | ORAL | Status: DC | PRN

## 2012-02-13 NOTE — Consult Note (Signed)
Subjective: Patient is a 79 y.o. male who complains of low back pain which radiates into the upper anterior thighs and down the anterior thighs almost to the knees. He is status post previous decompressive laminectomy and non-instrumented fusion in the remote past.  Onset of symptoms was 3 weeks ago, gradually worsening since that time.  Onset was related to no known injury. The pain is rated severe, and is located at the across the lower back and radiates as above especially with standing in an upright posture. He seems somewhat better if he stands flexed and when he lies in bed on his left side. The pain is described as aching and occurs intermittently. The symptoms have been progressive. Symptoms are exacerbated by standing. The patient has tried medication. CT myelogram showed severe spinal stenosis at L5-S1 which is recurrent and adjacent to previous L3-4 L4-5 decompression and fusion.   Past Medical History  Diagnosis Date  . Atrial fibrillation     Past Surgical History  Procedure Date  . Pacemaker insertion     No Known Allergies  History  Substance Use Topics  . Smoking status: Never Smoker   . Smokeless tobacco: Not on file  . Alcohol Use: No    No family history on file. Prior to Admission medications   Medication Sig Start Date End Date Taking? Authorizing Provider  ALPRAZolam Prudy Feeler) 0.5 MG tablet Take 0.5 mg by mouth daily as needed. For anxiety   Yes Historical Provider, MD  finasteride (PROSCAR) 5 MG tablet Take 5 mg by mouth daily.     Yes Historical Provider, MD  furosemide (LASIX) 40 MG tablet Take 40 mg by mouth daily.     Yes Historical Provider, MD  HYDROcodone-acetaminophen (VICODIN) 5-500 MG per tablet Take 1 tablet by mouth every 4 (four) hours as needed for pain. 12/01/10  Yes Alessandra Bevels. Mayford Knife, PA  levothyroxine (SYNTHROID, LEVOTHROID) 75 MCG tablet Take 75 mcg by mouth daily.     Yes Historical Provider, MD  lovastatin (MEVACOR) 20 MG tablet Take 40 mg by  mouth at bedtime.     Yes Historical Provider, MD  metolazone (ZAROXOLYN) 2.5 MG tablet Take 2.5 mg by mouth every Monday, Wednesday, and Friday. As needed for fluid retention.   Yes Historical Provider, MD  metoprolol succinate (TOPROL-XL) 25 MG 24 hr tablet Take 12.5 mg by mouth daily.   Yes Historical Provider, MD  olmesartan (BENICAR) 20 MG tablet Take 20 mg by mouth daily.   Yes Historical Provider, MD  terazosin (HYTRIN) 5 MG capsule Take 5 mg by mouth daily.     Yes Historical Provider, MD  warfarin (COUMADIN) 4 MG tablet Take 4 mg by mouth daily. Hasn't taken since last Thursday due to the anticipation of the CT scan that took place today.  Usually takes daily   Yes Historical Provider, MD  cephALEXin (KEFLEX) 500 MG capsule Take 1 capsule (500 mg total) by mouth 4 (four) times daily. 01/20/12   Fayrene Helper, PA-C     Review of Systems  Positive ROS: Negative, no fevers or chills or night sweats  All other systems have been reviewed and were otherwise negative with the exception of those mentioned in the HPI and as above.  Objective: Vital signs in last 24 hours: Temp:  [97.8 F (36.6 C)-98.4 F (36.9 C)] 98.4 F (36.9 C) (01/18 0600) Pulse Rate:  [63-91] 91  (01/18 0600) Resp:  [18-20] 18  (01/18 0600) BP: (105-163)/(66-77) 109/69 mmHg (01/18 0600) SpO2:  [  93 %-99 %] 94 % (01/18 0600)  General Appearance: Alert, cooperative, no distress, appears stated age Head: Normocephalic, without obvious abnormality, atraumatic Eyes: PERRL, conjunctiva/corneas clear, EOM's intact     Neck: Supple, symmetrical, trachea midline Back: well healed scar Lungs: , respirations unlabored Heart: irreg rhythm Abdomen: Soft, non-tender Extremities: Extremities normal, atraumatic, no cyanosis or edema Pulses: 2+ and symmetric all extremities Skin: Skin color, texture, turgor normal, no rashes or lesions  NEUROLOGIC:   Mental status: alert and oriented, no aphasia, good attention span, Fund of  knowledge/ memory ok Motor Exam - grossly normal except mild weakness of DF and some RLE hip flexor and KE weakness (4/5)  Sensory Exam - grossly normal Reflexes: 1+, no clonus Coordination - grossly normal except RLE Gait -not tested secondary to pain Balance - not tested Cranial Nerves: I: smell Not tested  II: visual acuity  OS: nl    OD: nl  II: visual fields Full to confrontation  II: pupils Equal, round, reactive to light  III,VII: ptosis None  III,IV,VI: extraocular muscles  Full ROM  V: mastication Normal  V: facial light touch sensation  Normal  V,VII: corneal reflex  Present  VII: facial muscle function - upper  Normal  VII: facial muscle function - lower Normal  VIII: hearing Not tested  IX: soft palate elevation  Normal  IX,X: gag reflex Present  XI: trapezius strength  5/5  XI: sternocleidomastoid strength 5/5  XI: neck flexion strength  5/5  XII: tongue strength  Normal    Data Review Lab Results  Component Value Date   WBC 6.2 02/12/2012   HGB 10.2* 02/12/2012   HCT 30.5* 02/12/2012   MCV 95.6 02/12/2012   PLT 120* 02/12/2012   Lab Results  Component Value Date   NA 143 02/12/2012   K 3.6 02/12/2012   CL 104 02/12/2012   CO2 30 02/12/2012   BUN 46* 02/12/2012   CREATININE 2.00* 02/12/2012   GLUCOSE 97 02/12/2012   Lab Results  Component Value Date   INR 1.29 02/12/2012    Assessment/Plan: 79 year old gentleman who is status post decompressive laminectomy L3-4 L4-5 L5-S1 with non-instrumented fusion L3-4 L4-5, has recurrent stenosis at L5-S1. He has back and bilateral leg pain, especially with standing and walking. For now I think this should be treated medically. I will speak to the radiologist about potential transforaminal epidural steroid injections at this level. I think we should do everything we can to avoid a large repeat decompression and instrumented fusion on him Which would be required to address his stenosis adequately. Continue therapy as tolerated.  Continue pain control.   Estalee Mccandlish S 02/13/2012 7:31 AM

## 2012-02-13 NOTE — Progress Notes (Signed)
Pt seen after arrival from [facility]. He is here for neurosurgery evaluation because of severe spinal stenosis and LE weakness. Was on telemetry at [facility] but this does not seem to be necessary at this time. All of his questions were answered, no other changes to current orders at this time.

## 2012-02-13 NOTE — Progress Notes (Signed)
[facility]             Progress Note   Subjective: Patient reports some relief on his pain; complaining of nausea, constipation and heart burn today.  Objective: Vital signs in last 24 hours: Temp:  [97.8 F (36.6 C)-98.4 F (36.9 C)] 98.4 F (36.9 C) (01/18 0600) Pulse Rate:  [63-91] 91  (01/18 0600) Resp:  [18-20] 18  (01/18 0600) BP: (105-163)/(66-77) 109/69 mmHg (01/18 0600) SpO2:  [93 %-99 %] 94 % (01/18 0600) Weight change:  Last BM Date: 02/10/12  Intake/Output from previous day: 01/17 0701 - 01/18 0700 In: 1916 [P.O.:840; I.V.:1076] Out: 1575 [Urine:1575] Total I/O In: 360 [P.O.:360] Out: 350 [Urine:350]   Physical Exam: General: Alert, awake, oriented x3, reports pain is slightly better HEENT: No bruits, no goiter. Heart: Regular rate and rhythm, without murmurs, rubs, gallops. Lungs: Clear to auscultation bilaterally. Abdomen: Soft, nontender, nondistended, positive bowel sounds. Extremities: No clubbing cyanosis or edema with positive pedal pulses. Neuro: motor 4/5 in both lower ext, sensations -light touch diminished on R DTR 1 plus, plantars mute  Lab Results: Basic Metabolic Panel:  Basename 02/12/12 0511 02/11/12 2300 02/11/12 1922  NA 143 -- 139  K 3.6 -- 4.0  CL 104 -- 99  CO2 30 -- 31  GLUCOSE 97 -- 130*  BUN 46* -- 45*  CREATININE 2.00* -- 1.91*  CALCIUM 9.3 -- 9.4  MG -- 2.2 --  PHOS -- 3.7 --   Liver Function Tests:  [facility] 02/12/12 0511  AST 22  ALT 13  ALKPHOS 45  BILITOT 0.3  PROT 5.9*  ALBUMIN 2.9*   CBC:  Basename 02/12/12 0511 02/11/12 1922  WBC 6.2 7.2  NEUTROABS -- 5.1  HGB 10.2* 10.8*  HCT 30.5* 32.3*  MCV 95.6 96.1  PLT 120* 127*   CBG:  Basename 02/13/12 0805 02/12/12 0729  GLUCAP 93 94   Coagulation:  Basename 02/12/12 0511 02/11/12 2300  LABPROT 15.8* 15.6*  INR 1.29 1.27   Urinalysis:  Basename 02/11/12 2015  COLORURINE YELLOW  LABSPEC 1.010  PHURINE 5.0  GLUCOSEU NEGATIVE    HGBUR NEGATIVE  BILIRUBINUR NEGATIVE  KETONESUR NEGATIVE  PROTEINUR NEGATIVE  UROBILINOGEN 0.2  NITRITE NEGATIVE  LEUKOCYTESUR NEGATIVE    Studies/Results: No results found.  Medications: Scheduled Meds:    . acetaminophen  1,000 mg Intravenous Q6H  . dexamethasone  4 mg Intravenous Q6H  . docusate sodium  50 mg Oral BID  . finasteride  5 mg Oral Daily  . irbesartan  75 mg Oral Daily  . levothyroxine  75 mcg Oral QAC breakfast  . metolazone  2.5 mg Oral Q M,W,F  . metoprolol succinate  12.5 mg Oral Daily  . pantoprazole  40 mg Oral Q1200  . polyethylene glycol  17 g Oral BID  . simvastatin  10 mg Oral q1800  . sodium chloride  3 mL Intravenous Q12H  . terazosin  5 mg Oral Daily   Continuous Infusions:    . sodium chloride 20 mL/hr at 02/12/12 1311   PRN Meds:.acetaminophen, acetaminophen, ALPRAZolam, HYDROmorphone (DILAUDID) injection, ondansetron (ZOFRAN) IV, ondansetron, oxyCODONE  Assessment/Plan: 1. Severe L5S1 spinal stenosis with weakness, inability to ambulate and multiple falls  Contacted Dr.WongWater engineer), he is unable to do any procedure here due to lack of privileges, D/W IR (Dr.Curnes), they dont think he is a candidate for cortisone injection due to prior lumbar fusion and lack of epidural space. Will follow NSG recommendations; at this point checking myelogram,  CRP and ESR. Continue decadron and NSG will discuss for possibble steroids injection.  HTN (hypertension)  Stable, continue avapro and toprol Anemia of chronic disease  Stable Follow Hgb trend CKD (chronic kidney disease), stage III  Appears to be at baseline  Will monitor Atrial fibrillation  Continue  metoprolol 25 mg daily, continue to hold coumadin incase of potential procedure/surgery, this has been on hold since Wednesday on admission  Myelogram ordered by neurosurgery will follow results. BPH  Continue proscar Suspicious findings on CT abd for renal cell carcinoma  Pt  aware of the diagnosis and plans to follow up first with his PCP in [place], will need further workup once acute issues addressed Constipation: due to narcotics, will start miralax and colace. GERD and GI protection: started on PPI. Thrombocytopenia  Chronic stable     Code Status: Full  Family Communication: discussed with pt, wife and daughter at bedside  Disposition Plan: pending improvement in clinical status     Time spent coordinating care:   LOS: 2 days   Daci Stubbe [facility] Pager: [phone] 02/13/2012, 11:43 AM

## 2012-02-13 NOTE — Progress Notes (Signed)
Patient ID: [name], male   DOB: [birth year], 79 y.o.   MRN: [id number] upon further review of his films there is some process causing stenosis plus an HNP at T12-L1. Have asked for a thoracic myelogram. Will check CRP and ESR.

## 2012-02-13 NOTE — Progress Notes (Signed)
Patient ID: [name], male   DOB: 1930/10/23, 79 y.o.   MRN: [id number] I reviewed the patient's thoracic myelogram, and it appears he has severe stenosis at T12-L1 (there is a transitional level in the lumbar region) secondary to ligamentum flavum hypertrophy and buckling and a large right-sided disc protrusion with free fragment extending superiorly. I've gone over the films with the patient and his daughter. I've explained the findings on the films and how they relate to his symptoms. I have recommended a thoracic laminectomy T12 and L1 followed by microdiscectomy at T12-L1 on the right. I views the films as a template and described the surgery as best I can. They understand the risks of the surgery include but are not limited to bleeding, infection, spinal cord injury, weakness, paralysis, numbness, tingling, loss of bowel or bladder function,  ability to walk, loss of sexual function, worsening of symptoms, lack of relief of symptoms, and anesthesia risks. We will need the representative here to help with his pacer, and I discussed this with the anesthesiologist on call today he states he will convey this to Dr. crews who is on-call tomorrow. He understands the typical outcomes and they wished to proceed in hopes of improving his pain syndrome.

## 2012-02-14 ENCOUNTER — Encounter (HOSPITAL_COMMUNITY)
Admission: EM | Disposition: A | Discharge status: Skilled Nursing Facility | Payer: Self-pay | Source: Home / Self Care | Attending: Internal Medicine

## 2012-02-14 ENCOUNTER — Inpatient Hospital Stay (HOSPITAL_COMMUNITY): Payer: Medicare Other | Admitting: Anesthesiology

## 2012-02-14 ENCOUNTER — Inpatient Hospital Stay (HOSPITAL_COMMUNITY): Payer: Medicare Other

## 2012-02-14 ENCOUNTER — Encounter (HOSPITAL_COMMUNITY): Payer: Self-pay | Admitting: Anesthesiology

## 2012-02-14 DIAGNOSIS — E039 Hypothyroidism, unspecified: Secondary | ICD-10-CM

## 2012-02-14 DIAGNOSIS — E785 Hyperlipidemia, unspecified: Secondary | ICD-10-CM | POA: Diagnosis present

## 2012-02-14 DIAGNOSIS — I4891 Unspecified atrial fibrillation: Secondary | ICD-10-CM

## 2012-02-14 DIAGNOSIS — N400 Enlarged prostate without lower urinary tract symptoms: Secondary | ICD-10-CM | POA: Diagnosis present

## 2012-02-14 HISTORY — PX: LUMBAR LAMINECTOMY/DECOMPRESSION MICRODISCECTOMY: SHX5026

## 2012-02-14 LAB — CBC
HCT: 32.5 % — ABNORMAL LOW (ref 39.0–52.0)
MCHC: 33.5 g/dL (ref 30.0–36.0)
MCV: 95 fL (ref 78.0–100.0)
Platelets: 140 10*3/uL — ABNORMAL LOW (ref 150–400)
RDW: 12.7 % (ref 11.5–15.5)
WBC: 9.5 10*3/uL (ref 4.0–10.5)

## 2012-02-14 LAB — BASIC METABOLIC PANEL
BUN: 38 mg/dL — ABNORMAL HIGH (ref 6–23)
BUN: 37 mg/dL — ABNORMAL HIGH (ref 6–23)
Calcium: 9.5 mg/dL (ref 8.4–10.5)
Chloride: 105 mEq/L (ref 96–112)
Creatinine, Ser: 1.67 mg/dL — ABNORMAL HIGH (ref 0.50–1.35)
Creatinine, Ser: 1.6 mg/dL — ABNORMAL HIGH (ref 0.50–1.35)
GFR calc Af Amer: 43 mL/min — ABNORMAL LOW (ref >90)
GFR calc Af Amer: 46 mL/min — ABNORMAL LOW (ref >90)
GFR calc non Af Amer: 39 mL/min — ABNORMAL LOW (ref >90)
Potassium: 4 mEq/L (ref 3.5–5.1)

## 2012-02-14 LAB — GLUCOSE, CAPILLARY: Glucose-Capillary: 140 mg/dL — ABNORMAL HIGH (ref 70–99)

## 2012-02-14 LAB — PROTIME-INR
INR: 1.27 (ref 0.00–1.49)
Prothrombin Time: 15.6 seconds — ABNORMAL HIGH (ref 11.6–15.2)

## 2012-02-14 SURGERY — LUMBAR LAMINECTOMY/DECOMPRESSION MICRODISCECTOMY 1 LEVEL
Anesthesia: General | Site: Back | Wound class: Clean

## 2012-02-14 MED ORDER — OXYCODONE HCL 5 MG PO TABS: 5.0000 mg | ORAL_TABLET | Freq: Once | ORAL | Status: DC | PRN

## 2012-02-14 MED ORDER — DEXAMETHASONE SODIUM PHOSPHATE 4 MG/ML IJ SOLN
4.0000 mg | Freq: Four times a day (QID) | INTRAMUSCULAR | Status: DC
  Filled 2012-02-14 (×8): qty 1

## 2012-02-14 MED ORDER — ACETAMINOPHEN 650 MG RE SUPP: 650.0000 mg | RECTAL | Status: DC | PRN

## 2012-02-14 MED ORDER — MAGNESIUM CITRATE PO SOLN
1.0000 | Freq: Once | ORAL | Status: AC | PRN
  Filled 2012-02-14: qty 296

## 2012-02-14 MED ORDER — SODIUM CHLORIDE 0.9 % IR SOLN
Status: DC | PRN
  Administered 2012-02-14: 13:00:00

## 2012-02-14 MED ORDER — METHOCARBAMOL 500 MG PO TABS: 500.0000 mg | ORAL_TABLET | Freq: Four times a day (QID) | ORAL | Status: DC | PRN

## 2012-02-14 MED ORDER — PHENOL 1.4 % MT LIQD: 1.0000 | OROMUCOSAL | Status: DC | PRN

## 2012-02-14 MED ORDER — LIDOCAINE HCL (CARDIAC) 20 MG/ML IV SOLN
INTRAVENOUS | Status: DC | PRN
  Administered 2012-02-14: 80 mg via INTRAVENOUS

## 2012-02-14 MED ORDER — SODIUM CHLORIDE 0.9 % IV SOLN
INTRAVENOUS | Status: DC | PRN
  Administered 2012-02-14 (×3): via INTRAVENOUS

## 2012-02-14 MED ORDER — ONDANSETRON HCL 4 MG/2ML IJ SOLN: 4.0000 mg | INTRAMUSCULAR | Status: DC | PRN

## 2012-02-14 MED ORDER — MENTHOL 3 MG MT LOZG: 1.0000 | LOZENGE | OROMUCOSAL | Status: DC | PRN

## 2012-02-14 MED ORDER — CEFAZOLIN SODIUM 1-5 GM-% IV SOLN
1.0000 g | Freq: Three times a day (TID) | INTRAVENOUS | Status: AC
  Administered 2012-02-14 – 2012-02-15 (×2): 1 g via INTRAVENOUS
  Filled 2012-02-14 (×2): qty 50

## 2012-02-14 MED ORDER — OXYCODONE-ACETAMINOPHEN 5-325 MG PO TABS: 1.0000 | ORAL_TABLET | ORAL | Status: DC | PRN

## 2012-02-14 MED ORDER — ONDANSETRON HCL 4 MG/2ML IJ SOLN: 4.0000 mg | Freq: Once | INTRAMUSCULAR | Status: DC | PRN

## 2012-02-14 MED ORDER — SODIUM CHLORIDE 0.9 % IJ SOLN: 3.0000 mL | INTRAMUSCULAR | Status: DC | PRN

## 2012-02-14 MED ORDER — ROCURONIUM BROMIDE 100 MG/10ML IV SOLN
INTRAVENOUS | Status: DC | PRN
  Administered 2012-02-14: 50 mg via INTRAVENOUS

## 2012-02-14 MED ORDER — POTASSIUM CHLORIDE IN NACL 20-0.9 MEQ/L-% IV SOLN
INTRAVENOUS | Status: DC
  Filled 2012-02-14 (×4): qty 1000

## 2012-02-14 MED ORDER — METHOCARBAMOL 100 MG/ML IJ SOLN
500.0000 mg | Freq: Four times a day (QID) | INTRAVENOUS | Status: DC | PRN
  Filled 2012-02-14: qty 5

## 2012-02-14 MED ORDER — HYDROMORPHONE HCL PF 1 MG/ML IJ SOLN: 0.2500 mg | INTRAMUSCULAR | Status: DC | PRN

## 2012-02-14 MED ORDER — MORPHINE SULFATE 2 MG/ML IJ SOLN
1.0000 mg | INTRAMUSCULAR | Status: DC | PRN
Start: 2012-02-14 — End: 2012-02-16

## 2012-02-14 MED ORDER — DEXAMETHASONE 4 MG PO TABS
4.0000 mg | ORAL_TABLET | Freq: Four times a day (QID) | ORAL | Status: DC
  Administered 2012-02-14 – 2012-02-16 (×7): 4 mg via ORAL
  Filled 2012-02-14 (×11): qty 1

## 2012-02-14 MED ORDER — THROMBIN 5000 UNITS EX SOLR
OROMUCOSAL | Status: DC | PRN
  Administered 2012-02-14: 14:00:00 via TOPICAL

## 2012-02-14 MED ORDER — OXYCODONE HCL 5 MG/5ML PO SOLN: 5.0000 mg | Freq: Once | ORAL | Status: DC | PRN

## 2012-02-14 MED ORDER — HYDROCODONE-ACETAMINOPHEN 5-325 MG PO TABS
1.0000 | ORAL_TABLET | ORAL | Status: DC | PRN
  Administered 2012-02-14: 2 via ORAL
  Administered 2012-02-15: 1 via ORAL
  Administered 2012-02-15: 2 via ORAL
  Filled 2012-02-14 (×3): qty 2

## 2012-02-14 MED ORDER — SODIUM CHLORIDE 0.9 % IV SOLN: 250.0000 mL | INTRAVENOUS | Status: DC

## 2012-02-14 MED ORDER — DEXAMETHASONE SODIUM PHOSPHATE 4 MG/ML IJ SOLN
INTRAMUSCULAR | Status: DC | PRN
  Administered 2012-02-14: 8 mg via INTRAVENOUS

## 2012-02-14 MED ORDER — PROPOFOL 10 MG/ML IV BOLUS
INTRAVENOUS | Status: DC | PRN
  Administered 2012-02-14: 150 mg via INTRAVENOUS

## 2012-02-14 MED ORDER — SODIUM CHLORIDE 0.9 % IJ SOLN
3.0000 mL | Freq: Two times a day (BID) | INTRAMUSCULAR | Status: DC
  Administered 2012-02-15 (×2): 3 mL via INTRAVENOUS

## 2012-02-14 MED ORDER — FENTANYL CITRATE 0.05 MG/ML IJ SOLN
INTRAMUSCULAR | Status: DC | PRN
  Administered 2012-02-14: 150 ug via INTRAVENOUS
  Administered 2012-02-14: 100 ug via INTRAVENOUS
  Administered 2012-02-14: 50 ug via INTRAVENOUS
  Administered 2012-02-14: 100 ug via INTRAVENOUS

## 2012-02-14 MED ORDER — SENNA 8.6 MG PO TABS
1.0000 | ORAL_TABLET | Freq: Two times a day (BID) | ORAL | Status: DC
  Administered 2012-02-14 – 2012-02-16 (×4): 8.6 mg via ORAL
  Filled 2012-02-14 (×5): qty 1

## 2012-02-14 MED ORDER — 0.9 % SODIUM CHLORIDE (POUR BTL) OPTIME
TOPICAL | Status: DC | PRN
  Administered 2012-02-14: 1000 mL

## 2012-02-14 MED ORDER — THROMBIN 20000 UNITS EX KIT
PACK | CUTANEOUS | Status: DC | PRN
  Administered 2012-02-14: 14:00:00 via TOPICAL

## 2012-02-14 MED ORDER — DEXTROSE 5 % IV SOLN
3.0000 g | INTRAVENOUS | Status: DC | PRN
  Administered 2012-02-14: 3 g via INTRAVENOUS

## 2012-02-14 MED ORDER — BUPIVACAINE HCL (PF) 0.25 % IJ SOLN
INTRAMUSCULAR | Status: DC | PRN
  Administered 2012-02-14: 1 mL

## 2012-02-14 MED ORDER — ACETAMINOPHEN 325 MG PO TABS: 650.0000 mg | ORAL_TABLET | ORAL | Status: DC | PRN

## 2012-02-14 SURGICAL SUPPLY — 49 items
APL SKNCLS STERI-STRIP NONHPOA (GAUZE/BANDAGES/DRESSINGS) ×1
BAG DECANTER FOR FLEXI CONT (MISCELLANEOUS) ×2 IMPLANT
BENZOIN TINCTURE PRP APPL 2/3 (GAUZE/BANDAGES/DRESSINGS) ×2 IMPLANT
BUR MATCHSTICK NEURO 3.0 LAGG (BURR) ×2 IMPLANT
CANISTER SUCTION 2500CC (MISCELLANEOUS) ×2 IMPLANT
CLOTH BEACON ORANGE TIMEOUT ST (SAFETY) ×2 IMPLANT
CONT SPEC 4OZ CLIKSEAL STRL BL (MISCELLANEOUS) ×2 IMPLANT
DRAPE LAPAROTOMY 100X72X124 (DRAPES) ×2 IMPLANT
DRAPE MICROSCOPE LEICA (MISCELLANEOUS) ×1 IMPLANT
DRAPE MICROSCOPE ZEISS OPMI (DRAPES) ×1 IMPLANT
DRAPE POUCH INSTRU U-SHP 10X18 (DRAPES) ×2 IMPLANT
DRAPE SURG 17X23 STRL (DRAPES) ×2 IMPLANT
DRESSING TELFA 8X3 (GAUZE/BANDAGES/DRESSINGS) ×2 IMPLANT
DRSG OPSITE 4X5.5 SM (GAUZE/BANDAGES/DRESSINGS) ×2 IMPLANT
DURAPREP 26ML APPLICATOR (WOUND CARE) ×2 IMPLANT
ELECT REM PT RETURN 9FT ADLT (ELECTROSURGICAL) ×2
ELECTRODE REM PT RTRN 9FT ADLT (ELECTROSURGICAL) ×1 IMPLANT
EVACUATOR 1/8 PVC DRAIN (DRAIN) ×1 IMPLANT
GAUZE SPONGE 4X4 16PLY XRAY LF (GAUZE/BANDAGES/DRESSINGS) IMPLANT
GLOVE BIO SURGEON STRL SZ 6.5 (GLOVE) ×1 IMPLANT
GLOVE BIO SURGEON STRL SZ7 (GLOVE) ×2 IMPLANT
GLOVE BIO SURGEON STRL SZ8 (GLOVE) ×2 IMPLANT
GLOVE INDICATOR 7.5 STRL GRN (GLOVE) ×1 IMPLANT
GOWN BRE IMP SLV AUR LG STRL (GOWN DISPOSABLE) ×2 IMPLANT
GOWN BRE IMP SLV AUR XL STRL (GOWN DISPOSABLE) ×2 IMPLANT
GOWN STRL REIN 2XL LVL4 (GOWN DISPOSABLE) IMPLANT
HEMOSTAT POWDER KIT SURGIFOAM (HEMOSTASIS) IMPLANT
KIT BASIN OR (CUSTOM PROCEDURE TRAY) ×2 IMPLANT
KIT ROOM TURNOVER OR (KITS) ×2 IMPLANT
NDL HYPO 25X1 1.5 SAFETY (NEEDLE) ×1 IMPLANT
NDL SPNL 20GX3.5 QUINCKE YW (NEEDLE) IMPLANT
NEEDLE HYPO 25X1 1.5 SAFETY (NEEDLE) ×2 IMPLANT
NEEDLE SPNL 20GX3.5 QUINCKE YW (NEEDLE) ×2 IMPLANT
NS IRRIG 1000ML POUR BTL (IV SOLUTION) ×2 IMPLANT
PACK LAMINECTOMY NEURO (CUSTOM PROCEDURE TRAY) ×2 IMPLANT
PAD ARMBOARD 7.5X6 YLW CONV (MISCELLANEOUS) ×6 IMPLANT
RUBBERBAND STERILE (MISCELLANEOUS) ×4 IMPLANT
SPONGE LAP 4X18 X RAY DECT (DISPOSABLE) ×1 IMPLANT
SPONGE SURGIFOAM ABS GEL SZ50 (HEMOSTASIS) ×1 IMPLANT
STRIP CLOSURE SKIN 1/2X4 (GAUZE/BANDAGES/DRESSINGS) ×2 IMPLANT
SUT VIC AB 0 CT1 18XCR BRD8 (SUTURE) ×1 IMPLANT
SUT VIC AB 0 CT1 8-18 (SUTURE) ×2
SUT VIC AB 2-0 CP2 18 (SUTURE) ×2 IMPLANT
SUT VIC AB 3-0 SH 8-18 (SUTURE) ×2 IMPLANT
SYR 20ML ECCENTRIC (SYRINGE) ×2 IMPLANT
TAPE STRIPS DRAPE STRL (GAUZE/BANDAGES/DRESSINGS) ×1 IMPLANT
TOWEL OR 17X24 6PK STRL BLUE (TOWEL DISPOSABLE) ×2 IMPLANT
TOWEL OR 17X26 10 PK STRL BLUE (TOWEL DISPOSABLE) ×2 IMPLANT
WATER STERILE IRR 1000ML POUR (IV SOLUTION) ×2 IMPLANT

## 2012-02-14 NOTE — Consult Note (Signed)
Agree with above.  For now can be managed with pain control and PT, will discuss with Dr. Regino Schultze and arrange for injection therapy as outpatient if able to be discharged home.

## 2012-02-14 NOTE — Anesthesia Preprocedure Evaluation (Addendum)
Anesthesia Evaluation  Patient identified by MRN, date of birth, ID band Patient awake    Reviewed: Allergy & Precautions, H&P , NPO status , Patient's Chart, lab work & pertinent test results, reviewed documented beta blocker date and time   Airway Mallampati: I TM Distance: >3 FB Neck ROM: Full    Dental  (+) Edentulous Upper, Upper Dentures and Dental Advisory Given   Pulmonary  breath sounds clear to auscultation        Cardiovascular hypertension, Pt. on medications and Pt. on home beta blockers + dysrhythmias Atrial Fibrillation Rhythm:Regular Rate:Normal     Neuro/Psych    GI/Hepatic   Endo/Other  Hypothyroidism   Renal/GU Renal InsufficiencyRenal disease     Musculoskeletal   Abdominal   Peds  Hematology   Anesthesia Other Findings   Reproductive/Obstetrics                          Anesthesia Physical Anesthesia Plan  ASA: III  Anesthesia Plan: General   Post-op Pain Management:    Induction: Intravenous  Airway Management Planned: Oral ETT  Additional Equipment:   Intra-op Plan:   Post-operative Plan: Extubation in OR  Informed Consent: I have reviewed the patients History and Physical, chart, labs and discussed the procedure including the risks, benefits and alternatives for the proposed anesthesia with the patient or authorized representative who has indicated his/her understanding and acceptance.   Dental advisory given  Plan Discussed with: CRNA, Anesthesiologist and Surgeon  Anesthesia Plan Comments:         Anesthesia Quick Evaluation

## 2012-02-14 NOTE — Op Note (Signed)
02/11/2012 - 02/14/2012  3:23 PM  PATIENT:  [name]  79 y.o. male  PRE-OPERATIVE DIAGNOSIS:  Thoracic stenosis with large right T12-L1 disk herniation  POST-OPERATIVE DIAGNOSIS:  same  PROCEDURE:  Thoracic laminectomy T12 and L1 with Right T12-L1 diskectomy  SURGEON:  Marikay Alar, MD  ASSISTANTS: none  ANESTHESIA:   General  EBL: 200 ml  Total I/O In: 2000 [I.V.:2000] Out: 610 [Urine:410; Blood:200]  BLOOD ADMINISTERED:none  DRAINS: med hemovac   SPECIMEN:  No Specimen  INDICATION FOR PROCEDURE: This patient was admitted to the hospital with severe back pain radiating into his hips and anterior legs. CT myelogram showed severe spinal stenosis at T12-L1 with buckling of the ligamentum flavum and a large right-sided disc protrusion. Recommended a T12 and L1 bilateral laminectomies for canal decompression followed by discectomy at T12-L1 on the right. Patient understood the risks, benefits, and alternatives and potential outcomes and wished to proceed.  PROCEDURE DETAILS: The patient was taken to the operating room and after induction of adequate generalized endotracheal anesthesia, the patient was rolled into the prone position on the Wilson frame and all pressure points were padded. The lumbar region was cleaned and then prepped with DuraPrep and draped in the usual sterile fashion. 5 cc of local anesthesia was injected and then a dorsal midline incision was made and carried down to the thoracic fascia. The fascia was opened and the paraspinous musculature was taken down in a subperiosteal fashion to expose the lamina of T12 and L1 bilaterally. Intraoperative x-ray confirmed my level, and then I used a combination of the high-speed drill and the Kerrison punches to perform a complete laminectomy, medial facetectomy, and foraminotomy at T12 and L1 bilaterally. The underlying yellow ligament was opened and removed in a piecemeal fashion to expose the underlying dura. At the  pedicle level of L1 there was a large area of partially calcified buckled yellow ligament causing severe compression of the dorsal thecal sac. I fear he carefully teased this away from the dura and removed in a piecemeal fashion until the dura relaxed. I was careful not to manipulate the dura and underlying cord.  I then undercut the lateral recess bilaterally and dissected down until I was medial to and distal to the L1 pedicle. The nerve root was well decompressed. I was able to see the lateral edge of the right side of the dura and under the exiting T12 nerve root there was a large free fragment. I was overuse and nerve hook and micropituitary rongeur to remove several pieces. I then palpated with a coronary dilator along the nerve root and into the foramen to assure adequate decompression. I felt no more compression of the nerve root or the thecal sac. I irrigated with saline solution containing bacitracin. Achieved hemostasis with bipolar cautery, lined the dura with Gelfoam, placed a medium Hemovac drain and then closed the fascia with 0 Vicryl. I closed the subcutaneous tissues with 2-0 Vicryl and the subcuticular tissues with 3-0 Vicryl. The skin was then closed with benzoin and Steri-Strips. The drapes were removed, a sterile dressing was applied. The patient was awakened from general anesthesia and transferred to the recovery room in stable condition. At the end of the procedure all sponge, needle and instrument counts were correct.   PLAN OF CARE: Admit to inpatient   PATIENT DISPOSITION:  PACU - hemodynamically stable.   Delay start of Pharmacological VTE agent (>24hrs) due to surgical blood loss or risk of bleeding:  yes

## 2012-02-14 NOTE — Anesthesia Procedure Notes (Signed)
Procedure Name: Intubation Date/Time: 02/14/2012 1:00 PM Performed by: Alanda Amass A Pre-anesthesia Checklist: Emergency Drugs available, Patient identified, Timeout performed, Suction available and Patient being monitored Oxygen Delivery Method: Circle system utilized Preoxygenation: Pre-oxygenation with 100% oxygen Intubation Type: IV induction Ventilation: Mask ventilation without difficulty Laryngoscope Size: Mac and 3 Grade View: Grade I Tube type: Oral Tube size: 8.0 mm Number of attempts: 1 Airway Equipment and Method: Stylet Secured at: 21 cm Tube secured with: Tape Dental Injury: Teeth and Oropharynx as per pre-operative assessment

## 2012-02-14 NOTE — Anesthesia Postprocedure Evaluation (Signed)
  Anesthesia Post-op Note  Patient: [name]  Procedure(s) Performed: Procedure(s) (LRB) with comments: LUMBAR LAMINECTOMY/DECOMPRESSION MICRODISCECTOMY 1 LEVEL (N/A) - Thoracic twelve - Lumbar one decompressive laminectomy.  Patient Location: PACU  Anesthesia Type:General  Level of Consciousness: awake, alert  and oriented  Airway and Oxygen Therapy: Patient Spontanous Breathing  Post-op Pain: none  Post-op Assessment: Post-op Vital signs reviewed  Post-op Vital Signs: Reviewed  Complications: No apparent anesthesia complications

## 2012-02-14 NOTE — Transfer of Care (Signed)
Immediate Anesthesia Transfer of Care Note  Patient: [name]  Procedure(s) Performed: Procedure(s) (LRB) with comments: LUMBAR LAMINECTOMY/DECOMPRESSION MICRODISCECTOMY 1 LEVEL (N/A) - Thoracic twelve - Lumbar one decompressive laminectomy.  Patient Location: PACU  Anesthesia Type:General  Level of Consciousness: awake  Airway & Oxygen Therapy: Patient Spontanous Breathing and Patient connected to nasal cannula oxygen  Post-op Assessment: Report given to PACU RN and Post -op Vital signs reviewed and stable  Post vital signs: Reviewed and stable  Complications: No apparent anesthesia complications

## 2012-02-14 NOTE — Plan of Care (Signed)
Problem: Phase I Progression Outcomes Goal: Pain controlled with appropriate interventions Outcome: Progressing Encouraged patient to approach pain control with proactive plan; getting medicated when pain first starts. Goal: OOB as tolerated unless otherwise ordered Outcome: Not Progressing Patient to be evaluated by therapy for activity tolerance. Goal: Log roll for position change Outcome: Progressing Patient able to assist with log rolling for repositioning. Goal: Initial discharge plan identified Outcome: Not Progressing Unsure of discharge status immediately post operatively. Goal: Hemodynamically stable Outcome: Progressing Patient vitals are stable upon arrival to floor from PACU.

## 2012-02-14 NOTE — Progress Notes (Signed)
[facility]             Progress Note   Subjective: Patient reports some relief on his pain; complaining of nausea, constipation and heart burn today.  Objective: Vital signs in last 24 hours: Temp:  [97.4 F (36.3 C)-98.6 F (37 C)] 97.4 F (36.3 C) (01/19 1629) Pulse Rate:  [71-156] 71  (01/19 1619) Resp:  [14-24] 24  (01/19 1619) BP: (116-141)/(66-90) 129/77 mmHg (01/19 1619) SpO2:  [94 %-100 %] 100 % (01/19 1619) Weight change:  Last BM Date: 02/13/12  Intake/Output from previous day: 01/18 0701 - 01/19 0700 In: 702 [P.O.:600; IV Piggyback:102] Out: 825 [Urine:825] Total I/O In: 2000 [I.V.:2000] Out: 610 [Urine:410; Blood:200]   Physical Exam: General: Alert, awake, oriented x3, reports pain is slightly better HEENT: No bruits, no goiter. Heart: Regular rate and rhythm, without murmurs, rubs, gallops. Lungs: Clear to auscultation bilaterally. Abdomen: Soft, nontender, nondistended, positive bowel sounds. Extremities: No clubbing cyanosis or edema with positive pedal pulses. Neuro: motor 4/5 in both lower ext, sensations -light touch diminished on R DTR 1 plus, plantars mute  Lab Results: Basic Metabolic Panel:  Basename 02/14/12 0939 02/14/12 0615 02/11/12 2300  NA 140 140 --  K 4.0 4.1 --  CL 104 105 --  CO2 25 25 --  GLUCOSE 143* 158* --  BUN 37* 38* --  CREATININE 1.60* 1.67* --  CALCIUM 9.7 9.5 --  MG -- -- 2.2  PHOS -- -- 3.7   Liver Function Tests:  [facility] 02/12/12 0511  AST 22  ALT 13  ALKPHOS 45  BILITOT 0.3  PROT 5.9*  ALBUMIN 2.9*   CBC:  Basename 02/14/12 0939 02/12/12 0511 02/11/12 1922  WBC 9.5 6.2 --  NEUTROABS -- -- 5.1  HGB 10.9* 10.2* --  HCT 32.5* 30.5* --  MCV 95.0 95.6 --  PLT 140* 120* --   CBG:  Basename 02/14/12 0731 02/13/12 0805 02/12/12 0729  GLUCAP 140* 93 94   Coagulation:  Basename 02/14/12 0615 02/12/12 0511  LABPROT 15.6* 15.8*  INR 1.27 1.29   Urinalysis:  Basename 02/11/12 2015   COLORURINE YELLOW  LABSPEC 1.010  PHURINE 5.0  GLUCOSEU NEGATIVE  HGBUR NEGATIVE  BILIRUBINUR NEGATIVE  KETONESUR NEGATIVE  PROTEINUR NEGATIVE  UROBILINOGEN 0.2  NITRITE NEGATIVE  LEUKOCYTESUR NEGATIVE    Studies/Results: Dg Lumbar Spine 2-3 Views  02/14/2012  *RADIOLOGY REPORT*  Clinical Data: Back pain  LUMBAR SPINE - 2-3 VIEW  Comparison: CT myelogram.  Findings: Intraoperative lateral radiograph at 1315 hours demonstrates a needle from a posterior approach directed most closely toward L1.  IMPRESSION: As above.   Original Report Authenticated By: Davonna Belling, M.D.    Ct Thoracic Spine W Contrast  02/13/2012  *RADIOLOGY REPORT*  MYELOGRAM INJECTION  Technique:  Informed consent was obtained from the patient prior to the procedure, including potential complications of headache, allergy, infection and pain. Specific instructions were given regarding 24 hour bedrest post procedure to prevent post-LP headache.  A timeout procedure was performed.  With the patient prone, the lower back was prepped with Betadine.  1% Lidocaine was used for local anesthesia.  Lumbar puncture was performed by the radiologist at the L5-S1 level using a 22 gauge needle with return of clear CSF. I personally performed the lumbar puncture and administered the intrathecal contrast. I also personally supervised acquisition of the myelogram images. 10 ml of Omnipaque-300 was injected into the subarachnoid space after injecting contrast, I injected approximately 3 ml of air in an  attempt to help transport the contrast cephalad.  IMPRESSION: Successful injection of  intrathecal contrast for myelography.  MYELOGRAM THORACIC  Clinical Data:  Bilateral leg weakness.  Severe pain.  Comparison: CT myelogram in the lumbar region 02/10/2012.  Findings: The patient had difficulty remaining motionless for the exam and could not cooperate to remain in the lateral decubitus position to maneuver the contrast into the thoracic region.  Multilevel spondylosis in the lumbar region hindered transport of the contrast as a bolus.  He was placed on his back into the head down position, approximately 10 degrees for 2 minutes.  There appeared to be moderate stenosis at the thoracolumbar junction incompletely evaluated. The patient was transported on a stretcher to CT scan for further evaluation.  Fluoroscopy Time: 3 minutes, 1 second minutes  IMPRESSION: As above.  CT MYELOGRAPHY THORACIC SPINE  Technique: Multidetector CT imaging of the lumbar spine was performed following myelography.  Multiplanar CT image reconstructions were also generated.  Findings:  There is no pneumothorax.  Moderate thoracic atherosclerosis is present.  A pacemaker is present.  Overall opacification of the thoracic subarachnoid space is fair to poor.  The individual disc spaces are examined as follows:   T1-2: Small calcified central protrusion.  No compressive lesion. Extensive calcification anteriorly  T2-3:  Probable anterior fusion.  No posterior protrusion.  T3-4:  Probable anterior fusion.  No posterior protrusion.  T4-5:  Probable into fusion.  No posterior protrusion.  T5-6:  Probable anterior fusion.  No posterior protrusion.  T6-7:  Probable anterior fusion.  No posterior protrusion.  T7-8:  Probable anterior fusion.  Small calcified posterior protrusion is non compressive.  T8-9:  Probable anterior fusion.  No posterior protrusion.  T9-10:  Small calcified protrusion is noted centrally.  No neural compression.  Probable anterior fusion.  T10-11:  No visible anterior fusion.  No posterior protrusion.  T11-12:  Probable anterolateral fusion.  No posterior protrusion.  T12-L1:  There is no visible anterior fusion at this level as was seen at the other levels. There is moderate multifactorial stenosis with vacuum disc phenomenon.  There is a partially calcified central and rightward posterior disc extrusion with slight cephalad migration displacing the cord right-to-left.  This is best seen on images 145-148 series 8.  There is considerable ligamentum flavum hypertrophy, partially calcified, at or slightly below the interspace, with ligamentous infolding resulting in cord flattening and displacement anteriorly ( image 154-155 series 8).  IMPRESSION: Moderate to severe multifactorial stenosis at T12 - L1 related to a central and rightward extrusion as well as ligamentum flavum hypertrophy. Mass effect on the cord relates to the rightward disc extrusion just above the interspace, whereas the ligament flavum mass effect is greatest at the level of to just slightly below the interspace.  Findings discussed with Dr. Yetta Barre.   Original Report Authenticated By: Davonna Belling, M.D.    Dg Myelogram Thoracic  02/13/2012  *RADIOLOGY REPORT*  MYELOGRAM INJECTION  Technique:  Informed consent was obtained from the patient prior to the procedure, including potential complications of headache, allergy, infection and pain. Specific instructions were given regarding 24 hour bedrest post procedure to prevent post-LP headache.  A timeout procedure was performed.  With the patient prone, the lower back was prepped with Betadine.  1% Lidocaine was used for local anesthesia.  Lumbar puncture was performed by the radiologist at the L5-S1 level using a 22 gauge needle with return of clear CSF. I personally performed the lumbar puncture and administered the intrathecal  contrast. I also personally supervised acquisition of the myelogram images. 10 ml of Omnipaque-300 was injected into the subarachnoid space after injecting contrast, I injected approximately 3 ml of air in an attempt to help transport the contrast cephalad.  IMPRESSION: Successful injection of  intrathecal contrast for myelography.  MYELOGRAM THORACIC  Clinical Data:  Bilateral leg weakness.  Severe pain.  Comparison: CT myelogram in the lumbar region 02/10/2012.  Findings: The patient had difficulty remaining motionless for the exam and could  not cooperate to remain in the lateral decubitus position to maneuver the contrast into the thoracic region. Multilevel spondylosis in the lumbar region hindered transport of the contrast as a bolus.  He was placed on his back into the head down position, approximately 10 degrees for 2 minutes.  There appeared to be moderate stenosis at the thoracolumbar junction incompletely evaluated. The patient was transported on a stretcher to CT scan for further evaluation.  Fluoroscopy Time: 3 minutes, 1 second minutes  IMPRESSION: As above.  CT MYELOGRAPHY THORACIC SPINE  Technique: Multidetector CT imaging of the lumbar spine was performed following myelography.  Multiplanar CT image reconstructions were also generated.  Findings:  There is no pneumothorax.  Moderate thoracic atherosclerosis is present.  A pacemaker is present.  Overall opacification of the thoracic subarachnoid space is fair to poor.  The individual disc spaces are examined as follows:   T1-2: Small calcified central protrusion.  No compressive lesion. Extensive calcification anteriorly  T2-3:  Probable anterior fusion.  No posterior protrusion.  T3-4:  Probable anterior fusion.  No posterior protrusion.  T4-5:  Probable into fusion.  No posterior protrusion.  T5-6:  Probable anterior fusion.  No posterior protrusion.  T6-7:  Probable anterior fusion.  No posterior protrusion.  T7-8:  Probable anterior fusion.  Small calcified posterior protrusion is non compressive.  T8-9:  Probable anterior fusion.  No posterior protrusion.  T9-10:  Small calcified protrusion is noted centrally.  No neural compression.  Probable anterior fusion.  T10-11:  No visible anterior fusion.  No posterior protrusion.  T11-12:  Probable anterolateral fusion.  No posterior protrusion.  T12-L1:  There is no visible anterior fusion at this level as was seen at the other levels. There is moderate multifactorial stenosis with vacuum disc phenomenon.  There is a partially calcified  central and rightward posterior disc extrusion with slight cephalad migration displacing the cord right-to-left. This is best seen on images 145-148 series 8.  There is considerable ligamentum flavum hypertrophy, partially calcified, at or slightly below the interspace, with ligamentous infolding resulting in cord flattening and displacement anteriorly ( image 154-155 series 8).  IMPRESSION: Moderate to severe multifactorial stenosis at T12 - L1 related to a central and rightward extrusion as well as ligamentum flavum hypertrophy. Mass effect on the cord relates to the rightward disc extrusion just above the interspace, whereas the ligament flavum mass effect is greatest at the level of to just slightly below the interspace.  Findings discussed with Dr. Yetta Barre.   Original Report Authenticated By: Davonna Belling, M.D.     Medications: Scheduled Meds:    . finasteride  5 mg Oral Daily  . irbesartan  75 mg Oral Daily  . levothyroxine  75 mcg Oral QAC breakfast  . metolazone  2.5 mg Oral Q M,W,F  . metoprolol succinate  12.5 mg Oral Daily  . pantoprazole  40 mg Oral Q1200  . polyethylene glycol  17 g Oral BID  . simvastatin  10 mg Oral q1800  .  sodium chloride  3 mL Intravenous Q12H  . terazosin  5 mg Oral Daily   Continuous Infusions:    . 0.9 % NaCl with KCl 20 mEq / L     PRN Meds:.ALPRAZolam, ondansetron (ZOFRAN) IV, ondansetron  Assessment/Plan: 1. Severe L5S1 spinal stenosis with weakness, inability to ambulate and multiple falls  Will follow NSG recommendations; at this point after reviewed myelogram plan is for decompressing surgery, laminectomy and diskectomy T12-L1 today. Will follow surgery outcome and recommendations from NSG. Continue PRN pain meds.  HTN (hypertension)  Stable, continue avapro and toprol  Anemia of chronic disease  Stable Follow Hgb trend  CKD (chronic kidney disease), stage III  Appears to be at baseline  Will monitor  Atrial fibrillation  Continue   metoprolol 25 mg daily, continue to hold coumadin incase of potential procedure/surgery, this has been on hold since Wednesday on admission  Restart coumadin once safe from neurosurgery stand point.  BPH  Continue proscar No urinary retention or dysuria at this time  Suspicious findings on CT abd for renal cell carcinoma  Pt aware of the diagnosis and plans to follow up first with his PCP in [place], will need further workup once acute issues addressed.  Constipation: due to narcotics, will start miralax and colace.  GERD and GI protection: Continue PPI.  HLD: continue statins  Hypothyroidism: continue synthroid  Thrombocytopenia  Chronic stable   Code Status: Full  Family Communication: discussed with pt Disposition Plan: pending improvement in clinical status; surgery today for decompression and laminectomy   Time spent: < 30 minutes   LOS: 3 days   [name] [facility] Pager: [phone] 02/14/2012, 4:56 PM

## 2012-02-15 MED ORDER — WHITE PETROLATUM GEL
Status: AC
  Filled 2012-02-15: qty 5

## 2012-02-15 NOTE — Evaluation (Signed)
Occupational Therapy Evaluation Patient Details Name: [name] MRN: [id number] DOB: Jul 01, 1930 Today's Date: 02/15/2012 Time: 4098-1191 OT Time Calculation (min): 38 min  OT Assessment / Plan / Recommendation Clinical Impression  79 yo male admitted with bil LE weakness, falls, back pain (lower back down bil LEs just above knees).   Pt underwent a thoracic myelogram which showed a T11-L1 HNP.  Pt. underwent T-11-L1 laminectomy and diskectomy.  Pt. presents to OT with bil. UE weakness, decreased sensation LEs (see PT notes), decreased balance, and decreased independence with back precautions resulting in decreased independence with BADLs.  Pt will benefit from OT to maximize safety and independence with BADLs to allow him to return to modified independent level after post acute rehab    OT Assessment  Patient needs continued OT Services    Follow Up Recommendations  CIR;Supervision/Assistance - 24 hour (will require SNF if not a candidate for CIR)    Barriers to Discharge Decreased caregiver support family unable to provide level of assist pt requires  Equipment Recommendations  None recommended by OT    Recommendations for Other Services Rehab consult  Frequency  Min 2X/week    Precautions / Restrictions Precautions Precautions: Fall Restrictions Weight Bearing Restrictions: No       ADL  Eating/Feeding: Independent Where Assessed - Eating/Feeding: Chair Grooming: Wash/dry face;Wash/dry hands;Teeth care;Brushing hair;Moderate assistance Where Assessed - Grooming: Supported standing Upper Body Bathing: Supervision/safety Where Assessed - Upper Body Bathing: Supported sitting;Unsupported sitting Lower Body Bathing: Moderate assistance Where Assessed - Lower Body Bathing: Supported sit to stand Upper Body Dressing: Supervision/safety Where Assessed - Upper Body Dressing: Unsupported sitting Lower Body Dressing: Moderate assistance Where Assessed - Lower Body Dressing:  Supported sit to Pharmacist, hospital: +2 Total assistance Toilet Transfer: Patient Percentage: 60% Toilet Transfer Method: Sit to Barista: Raised toilet seat with arms (or 3-in-1 over toilet) Toileting - Clothing Manipulation and Hygiene: Moderate assistance Where Assessed - Toileting Clothing Manipulation and Hygiene: Standing Equipment Used: Rolling walker;Gait belt Transfers/Ambulation Related to ADLs: Pt ambulates with total A +2 (p6 ~60-65%).  Pt. pitches to Rt., is somewhat impulsive, and ambulates with narrow BOS ADL Comments: Pt. able to cross ankles over knees to don/doff socks.  Requires mod cues for back precautions.  Pt requires mod A for standing balance with LB ADLs    OT Diagnosis: Generalized weakness  OT Problem List: Decreased strength;Impaired balance (sitting and/or standing);Decreased knowledge of use of DME or AE;Decreased knowledge of precautions OT Treatment Interventions: Self-care/ADL training;DME and/or AE instruction;Therapeutic activities;Patient/family education;Balance training   OT Goals Acute Rehab OT Goals OT Goal Formulation: With patient/family Time For Goal Achievement: 02/22/12 Potential to Achieve Goals: Good ADL Goals Pt Will Perform Grooming: with min assist;Standing at sink ADL Goal: Grooming - Progress: Goal set today Pt Will Perform Lower Body Bathing: with min assist;Sit to stand from chair;Sit to stand from bed ADL Goal: Lower Body Bathing - Progress: Goal set today Pt Will Perform Lower Body Dressing: with min assist;Sit to stand from chair;Sit to stand from bed ADL Goal: Lower Body Dressing - Progress: Goal set today Pt Will Transfer to Toilet: with min assist;Ambulation;3-in-1;Maintaining back safety precautions ADL Goal: Toilet Transfer - Progress: Goal set today Pt Will Perform Toileting - Clothing Manipulation: with min assist;Standing ADL Goal: Toileting - Clothing Manipulation - Progress: Goal set  today  Visit Information  Last OT Received On: 02/15/12 Assistance Needed: +2 PT/OT Co-Evaluation/Treatment: Yes    Subjective Data  Subjective: "I'm a  lot better than I was on Friday" Patient Stated Goal: To get back to normal   Prior Functioning     Home Living Lives With: Spouse Available Help at Discharge: Family Type of Home: House Home Access: Stairs to enter [employer] of Steps: 3 Entrance Stairs-Rails: Right Home Layout: Able to live on main level with bedroom/bathroom;Two level Bathroom Shower/Tub: Tub/shower unit;Other (comment) (walk in tub) Bathroom Toilet: Standard Bathroom Accessibility: Yes How Accessible: Accessible via walker Home Adaptive Equipment: Walker - rolling;Straight cane;Bedside commode/3-in-1;Grab bars in shower Prior Function Level of Independence: Needs assistance Needs Assistance: Bathing;Dressing;Grooming;Gait;Transfers;Light Housekeeping;Meal Prep;Toileting Bath: Minimal Dressing: Moderate Grooming: Minimal Toileting: Minimal Meal Prep: Total Light Housekeeping: Total Gait Assistance: min A with RW.  Pt. reports 6 falls since Christmas Able to Take Stairs?: No Driving: Yes Vocation: Retired Comments: Pt reports progressive decline since 01/19/12 Communication Communication: HOH Dominant Hand: Right         Vision/Perception Vision - Assessment Vision Assessment: Vision not tested   Cognition  Overall Cognitive Status: Appears within functional limits for tasks assessed/performed Arousal/Alertness: Awake/alert Behavior During Session: [facility] for tasks performed Cognition - Other Comments: Pt moves quickly, is impulsive - this appears to be his normal personality    Extremity/Trunk Assessment Right Upper Extremity Assessment RUE ROM/Strength/Tone: Within functional levels (Pt reports arthritis bil. shoulders) RUE Coordination: WFL - gross/fine motor Left Upper Extremity Assessment LUE ROM/Strength/Tone: Within  functional levels LUE Coordination: WFL - gross/fine motor Trunk Assessment Trunk Assessment: Normal     Mobility Bed Mobility Bed Mobility: Rolling Left;Left Sidelying to Sit;Sitting - Scoot to Edge of Bed Rolling Left: 5: Supervision;With rail Left Sidelying to Sit: 5: Supervision;With rails Sitting - Scoot to Edge of Bed: 5: Supervision Details for Bed Mobility Assistance: Pt requires step by step cues for back precautions and technique Transfers Transfers: Sit to Stand;Stand to Sit Sit to Stand: 1: +2 Total assist;With upper extremity assist;From bed Sit to Stand: Patient Percentage: 60% Stand to Sit: 1: +2 Total assist;With upper extremity assist;To chair/3-in-1 Stand to Sit: Patient Percentage: 60% Details for Transfer Assistance: Pt pitches to Lt     Shoulder Instructions     Exercise     Balance Balance Balance Assessed: Yes Static Sitting Balance Static Sitting - Balance Support: Feet supported;No upper extremity supported Static Sitting - Level of Assistance: 5: Stand by assistance Static Sitting - Comment/# of Minutes: 4   End of Session OT - End of Session Equipment Utilized During Treatment: Gait belt Activity Tolerance: Patient tolerated treatment well Patient left: in chair;with call bell/phone within reach;with family/visitor present Nurse Communication: Mobility status  GO     Lauriana Denes M 02/15/2012, 11:02 AM

## 2012-02-15 NOTE — Progress Notes (Signed)
[facility] PROGRESS NOTE   Assessment/Plan: Severe L5S1 spinal stenosis with weakness, inability to ambulate and multiple falls  - Will follow NSG recommendations;  - CT myelogram 1.18.2014 - decompressing surgery, laminectomy and diskectomy T12-L1 1.19.2014  HTN (hypertension)  - Stable, continue avapro and toprol  Anemia of chronic disease: - Stable  - Follow Hgb trend  CKD (chronic kidney disease), stage III: - Appears to be at baseline  - Will monitor  Atrial fibrillation : - Continue metoprolol 25 mg daily, continue to hold coumadin - Restart coumadin once safe from neurosurgery stand point.  BPH  - Continue proscar   Suspicious findings on CT abd for renal cell carcinoma  - CT 1.18.2014 suspicious finding, follow up with his PCP. - further evaluation as soon as acute condition stabilize.    Code Status: Full  Family Communication: discussed with pt  Disposition Plan: pending improvement in clinical status; surgery today for decompression and laminectomy   Consultants:  neursurgery  Procedures:  Laminectomy 1.19.2014  Antibiotics:  None  HPI/Subjective: Some lower extremity weakness no numbess.   Objective: Filed Vitals:   02/14/12 1742 02/14/12 2124 02/15/12 0324 02/15/12 0554  BP: 120/75 103/61 117/56 132/70  Pulse: 76 60 73 85  Temp: 97.5 F (36.4 C) 98 F (36.7 C) 98.8 F (37.1 C) 98.3 F (36.8 C)  TempSrc: Oral Oral Oral Oral  Resp: 18 16 16 16   Height:      Weight:      SpO2: 97% 98% 99% 99%    Intake/Output Summary (Last 24 hours) at 02/15/12 0901 Last data filed at 02/15/12 0842  Gross per 24 hour  Intake   2480 ml  Output   1640 ml  Net    840 ml   Filed Weights   02/11/12 2245  Weight: 107.5 kg (236 lb 15.9 oz)    Exam:  General: Alert, awake, oriented x3, in no acute distress.  HEENT: No bruits, no goiter.  Heart: Regular rate and rhythm, without murmurs, rubs, gallops.  Lungs: Good air movement, clear to  auscultation Abdomen: Soft, nontender, nondistended, positive bowel sounds.  Neuro: Grossly intact, nonfocal.   Data Reviewed: Basic Metabolic Panel:  Lab 02/14/12 1914 02/14/12 0615 02/12/12 0511 02/11/12 2300 02/11/12 1922  NA 140 140 143 -- 139  K 4.0 4.1 3.6 -- 4.0  CL 104 105 104 -- 99  CO2 25 25 30  -- 31  GLUCOSE 143* 158* 97 -- 130*  BUN 37* 38* 46* -- 45*  CREATININE 1.60* 1.67* 2.00* -- 1.91*  CALCIUM 9.7 9.5 9.3 -- 9.4  MG -- -- -- 2.2 --  PHOS -- -- -- 3.7 --   Liver Function Tests:  Lab 02/12/12 0511  AST 22  ALT 13  ALKPHOS 45  BILITOT 0.3  PROT 5.9*  ALBUMIN 2.9*   No results found for this basename: LIPASE:5,AMYLASE:5 in the last 168 hours No results found for this basename: AMMONIA:5 in the last 168 hours CBC:  Lab 02/14/12 0939 02/12/12 0511 02/11/12 1922  WBC 9.5 6.2 7.2  NEUTROABS -- -- 5.1  HGB 10.9* 10.2* 10.8*  HCT 32.5* 30.5* 32.3*  MCV 95.0 95.6 96.1  PLT 140* 120* 127*   Cardiac Enzymes: No results found for this basename: CKTOTAL:5,CKMB:5,CKMBINDEX:5,TROPONINI:5 in the last 168 hours BNP (last 3 results) No results found for this basename: PROBNP:3 in the last 8760 hours CBG:  Lab 02/14/12 0731 02/13/12 0805 02/12/12 0729  GLUCAP 140* 93 94    Recent Results (  from the past 240 hour(s))  SURGICAL PCR SCREEN     Status: Normal   Collection Time   02/14/12 10:05 AM      Component Value Range Status Comment   MRSA, PCR NEGATIVE  NEGATIVE Final    Staphylococcus aureus NEGATIVE  NEGATIVE Final      Studies: Dg Lumbar Spine 2-3 Views  02/14/2012  *RADIOLOGY REPORT*  Clinical Data: Back pain  LUMBAR SPINE - 2-3 VIEW  Comparison: CT myelogram.  Findings: Intraoperative lateral radiograph at 1315 hours demonstrates a needle from a posterior approach directed most closely toward L1.  IMPRESSION: As above.   Original Report Authenticated By: Davonna Belling, M.D.    Ct Thoracic Spine W Contrast  02/13/2012  *RADIOLOGY REPORT*  MYELOGRAM  INJECTION  Technique:  Informed consent was obtained from the patient prior to the procedure, including potential complications of headache, allergy, infection and pain. Specific instructions were given regarding 24 hour bedrest post procedure to prevent post-LP headache.  A timeout procedure was performed.  With the patient prone, the lower back was prepped with Betadine.  1% Lidocaine was used for local anesthesia.  Lumbar puncture was performed by the radiologist at the L5-S1 level using a 22 gauge needle with return of clear CSF. I personally performed the lumbar puncture and administered the intrathecal contrast. I also personally supervised acquisition of the myelogram images. 10 ml of Omnipaque-300 was injected into the subarachnoid space after injecting contrast, I injected approximately 3 ml of air in an attempt to help transport the contrast cephalad.  IMPRESSION: Successful injection of  intrathecal contrast for myelography.  MYELOGRAM THORACIC  Clinical Data:  Bilateral leg weakness.  Severe pain.  Comparison: CT myelogram in the lumbar region 02/10/2012.  Findings: The patient had difficulty remaining motionless for the exam and could not cooperate to remain in the lateral decubitus position to maneuver the contrast into the thoracic region. Multilevel spondylosis in the lumbar region hindered transport of the contrast as a bolus.  He was placed on his back into the head down position, approximately 10 degrees for 2 minutes.  There appeared to be moderate stenosis at the thoracolumbar junction incompletely evaluated. The patient was transported on a stretcher to CT scan for further evaluation.  Fluoroscopy Time: 3 minutes, 1 second minutes  IMPRESSION: As above.  CT MYELOGRAPHY THORACIC SPINE  Technique: Multidetector CT imaging of the lumbar spine was performed following myelography.  Multiplanar CT image reconstructions were also generated.  Findings:  There is no pneumothorax.  Moderate thoracic  atherosclerosis is present.  A pacemaker is present.  Overall opacification of the thoracic subarachnoid space is fair to poor.  The individual disc spaces are examined as follows:   T1-2: Small calcified central protrusion.  No compressive lesion. Extensive calcification anteriorly  T2-3:  Probable anterior fusion.  No posterior protrusion.  T3-4:  Probable anterior fusion.  No posterior protrusion.  T4-5:  Probable into fusion.  No posterior protrusion.  T5-6:  Probable anterior fusion.  No posterior protrusion.  T6-7:  Probable anterior fusion.  No posterior protrusion.  T7-8:  Probable anterior fusion.  Small calcified posterior protrusion is non compressive.  T8-9:  Probable anterior fusion.  No posterior protrusion.  T9-10:  Small calcified protrusion is noted centrally.  No neural compression.  Probable anterior fusion.  T10-11:  No visible anterior fusion.  No posterior protrusion.  T11-12:  Probable anterolateral fusion.  No posterior protrusion.  T12-L1:  There is no visible anterior fusion at  this level as was seen at the other levels. There is moderate multifactorial stenosis with vacuum disc phenomenon.  There is a partially calcified central and rightward posterior disc extrusion with slight cephalad migration displacing the cord right-to-left. This is best seen on images 145-148 series 8.  There is considerable ligamentum flavum hypertrophy, partially calcified, at or slightly below the interspace, with ligamentous infolding resulting in cord flattening and displacement anteriorly ( image 154-155 series 8).  IMPRESSION: Moderate to severe multifactorial stenosis at T12 - L1 related to a central and rightward extrusion as well as ligamentum flavum hypertrophy. Mass effect on the cord relates to the rightward disc extrusion just above the interspace, whereas the ligament flavum mass effect is greatest at the level of to just slightly below the interspace.  Findings discussed with Dr. Yetta Barre.   Original  Report Authenticated By: Davonna Belling, M.D.    Dg Myelogram Thoracic  02/13/2012  *RADIOLOGY REPORT*  MYELOGRAM INJECTION  Technique:  Informed consent was obtained from the patient prior to the procedure, including potential complications of headache, allergy, infection and pain. Specific instructions were given regarding 24 hour bedrest post procedure to prevent post-LP headache.  A timeout procedure was performed.  With the patient prone, the lower back was prepped with Betadine.  1% Lidocaine was used for local anesthesia.  Lumbar puncture was performed by the radiologist at the L5-S1 level using a 22 gauge needle with return of clear CSF. I personally performed the lumbar puncture and administered the intrathecal contrast. I also personally supervised acquisition of the myelogram images. 10 ml of Omnipaque-300 was injected into the subarachnoid space after injecting contrast, I injected approximately 3 ml of air in an attempt to help transport the contrast cephalad.  IMPRESSION: Successful injection of  intrathecal contrast for myelography.  MYELOGRAM THORACIC  Clinical Data:  Bilateral leg weakness.  Severe pain.  Comparison: CT myelogram in the lumbar region 02/10/2012.  Findings: The patient had difficulty remaining motionless for the exam and could not cooperate to remain in the lateral decubitus position to maneuver the contrast into the thoracic region. Multilevel spondylosis in the lumbar region hindered transport of the contrast as a bolus.  He was placed on his back into the head down position, approximately 10 degrees for 2 minutes.  There appeared to be moderate stenosis at the thoracolumbar junction incompletely evaluated. The patient was transported on a stretcher to CT scan for further evaluation.  Fluoroscopy Time: 3 minutes, 1 second minutes  IMPRESSION: As above.  CT MYELOGRAPHY THORACIC SPINE  Technique: Multidetector CT imaging of the lumbar spine was performed following myelography.   Multiplanar CT image reconstructions were also generated.  Findings:  There is no pneumothorax.  Moderate thoracic atherosclerosis is present.  A pacemaker is present.  Overall opacification of the thoracic subarachnoid space is fair to poor.  The individual disc spaces are examined as follows:   T1-2: Small calcified central protrusion.  No compressive lesion. Extensive calcification anteriorly  T2-3:  Probable anterior fusion.  No posterior protrusion.  T3-4:  Probable anterior fusion.  No posterior protrusion.  T4-5:  Probable into fusion.  No posterior protrusion.  T5-6:  Probable anterior fusion.  No posterior protrusion.  T6-7:  Probable anterior fusion.  No posterior protrusion.  T7-8:  Probable anterior fusion.  Small calcified posterior protrusion is non compressive.  T8-9:  Probable anterior fusion.  No posterior protrusion.  T9-10:  Small calcified protrusion is noted centrally.  No neural compression.  Probable anterior  fusion.  T10-11:  No visible anterior fusion.  No posterior protrusion.  T11-12:  Probable anterolateral fusion.  No posterior protrusion.  T12-L1:  There is no visible anterior fusion at this level as was seen at the other levels. There is moderate multifactorial stenosis with vacuum disc phenomenon.  There is a partially calcified central and rightward posterior disc extrusion with slight cephalad migration displacing the cord right-to-left. This is best seen on images 145-148 series 8.  There is considerable ligamentum flavum hypertrophy, partially calcified, at or slightly below the interspace, with ligamentous infolding resulting in cord flattening and displacement anteriorly ( image 154-155 series 8).  IMPRESSION: Moderate to severe multifactorial stenosis at T12 - L1 related to a central and rightward extrusion as well as ligamentum flavum hypertrophy. Mass effect on the cord relates to the rightward disc extrusion just above the interspace, whereas the ligament flavum mass effect  is greatest at the level of to just slightly below the interspace.  Findings discussed with Dr. Yetta Barre.   Original Report Authenticated By: Davonna Belling, M.D.     Scheduled Meds:   . dexamethasone  4 mg Intravenous Q6H   Or  . dexamethasone  4 mg Oral Q6H  . finasteride  5 mg Oral Daily  . irbesartan  75 mg Oral Daily  . levothyroxine  75 mcg Oral QAC breakfast  . metolazone  2.5 mg Oral Q M,W,F  . metoprolol succinate  12.5 mg Oral Daily  . pantoprazole  40 mg Oral Q1200  . polyethylene glycol  17 g Oral BID  . senna  1 tablet Oral BID  . simvastatin  10 mg Oral q1800  . sodium chloride  3 mL Intravenous Q12H  . sodium chloride  3 mL Intravenous Q12H  . terazosin  5 mg Oral Daily   Continuous Infusions:   . sodium chloride    . 0.9 % NaCl with KCl 20 mEq / Elbert Ewings       Marinda Elk  [facility] Pager [phone].  If 8PM-8AM, please contact night-coverage at [email or web address], password [facility] 02/15/2012, 9:01 AM  LOS: 4 days

## 2012-02-15 NOTE — Progress Notes (Signed)
Physical Therapy Treatment Note   02/15/12 0946  PT Visit Information  Last PT Received On 02/15/12  Assistance Needed +2  PT/OT Co-Evaluation/Treatment Yes  PT Time Calculation  PT Start Time 0946  PT Stop Time 1026  PT Time Calculation (min) 40 min  Subjective Data  Subjective Pt received supine in bed eager to get up.  Precautions  Precautions Fall  Restrictions  Weight Bearing Restrictions No  Cognition  Overall Cognitive Status Appears within functional limits for tasks assessed/performed  Arousal/Alertness Awake/alert  Behavior During Session [place] for tasks performed  Cognition - Other Comments Pt moves quickly, is impulsive - this appears to be his normal personality  Bed Mobility  Bed Mobility Rolling Left;Left Sidelying to Sit;Sitting - Scoot to Edge of Bed  Rolling Left 5: Supervision;With rail  Left Sidelying to Sit 5: Supervision;With rails  Sitting - Scoot to Edge of Bed 5: Supervision  Details for Bed Mobility Assistance Pt requires step by step cues for back precautions and technique  Transfers  Sit to Stand 1: +2 Total assist;With upper extremity assist;From bed  Sit to Stand: Patient Percentage 60%  Stand to Sit 1: +2 Total assist;With upper extremity assist;To chair/3-in-1  Stand to Sit: Patient Percentage 60%  Details for Transfer Assistance pt with strong push to Left-pt unaware  Ambulation/Gait  Ambulation/Gait Assistance 1: +2 Total assist  Ambulation/Gait: Patient Percentage 70%  Ambulation Distance (Feet) 50 Feet  Assistive device Rolling walker  Ambulation/Gait Assistance Details pt impulsive, requiring constant v/c's to stay on task and slow down. Pt extremely unsteady and narrow base of support. Pt appears as if he was walking on a tight rope despite v/c's to increase base of support. Pt with noted bilat LE weakness especialling in bilat hips and quads causing pt to rely heavily on UEs and strong push to Left. 1 person required to manage pt's trunk and  RW and other person required to manage hips and trunk due to pt at increased risk of falling and bilat LE buckling.  Gait Pattern Decreased weight shift to right (decreased foot clearance R>L)  Gait velocity impulsively fast despite v/c's to slow down  General Gait Details pt at increased fall risk  Stairs No  Balance  Balance Assessed Yes  Static Standing Balance  Static Standing - Balance Support Bilateral upper extremity supported  Static Standing - Level of Assistance 4: Min assist  Static Standing - Comment/# of Minutes 1 min with walker, if pt didn't have walker he would required maxA x2  PT - End of Session  Equipment Utilized During Treatment Gait belt  Activity Tolerance Patient limited by fatigue  Patient left in chair;with call bell/phone within reach;with family/visitor present  Nurse Communication Mobility status  PT - Assessment/Plan  Comments on Treatment Session Pt is a 79 y/o male s/p lumbar discectomy presenting with bilat LE weakness, R>L, noted bilat LE co-ordination deficits as well as balance impairments. Pt is also at high risk of falls and chance of bilat knee buckling. Pt to strongly benefit from CIR to achieve functional independence prior to transition home. Pt will need SNF if CIR not approved.  PT Plan Discharge plan needs to be updated;Frequency needs to be updated  PT Frequency Min 5X/week  Follow Up Recommendations Supervision/Assistance - 24 hour;CIR  Acute Rehab PT Goals  PT Goal: Supine/Side to Sit - Progress Met  PT Goal: Sit to Stand - Progress Progressing toward goal  PT Transfer Goal: Bed to Chair/Chair to Bed - Progress  Progressing toward goal  PT Goal: Ambulate - Progress Progressing toward goal  PT General Charges  $$ ACUTE PT VISIT 1 Procedure  PT Treatments  $Gait Training 23-37 mins  $Neuromuscular Re-education 8-22 mins     Pain: pt denies pain  Lewis Shock, PT, DPT Pager #: [phone] Office #: [phone]

## 2012-02-15 NOTE — Progress Notes (Signed)
Patient ID: [name], male   DOB: 1930/08/25, 79 y.o.   MRN: [id number] Pt denies pain. Looks much better. Incision ok. Good strength though a little uncoordinated. S;low to stand and somewhat unsteady. Urinating well. PT/OT. Marland Kitchen

## 2012-02-15 NOTE — Clinical Social Work Psychosocial (Signed)
     Clinical Social Work Department BRIEF PSYCHOSOCIAL ASSESSMENT 02/15/2012  Patient:  WILTON, THRALL     Account Number:  [id number]     Admit date:  02/11/2012  Clinical Social Worker:  Peggyann Shoals  Date/Time:  02/15/2012 05:05 PM  Referred by:  Physician  Date Referred:  02/15/2012 Referred for  SNF Placement   Other Referral:   Interview type:  Family Other interview type:    PSYCHOSOCIAL DATA Living Status:  WIFE Admitted from facility:   Level of care:   Primary support name:  [name] Bangura Primary support relationship to patient:  SPOUSE Degree of support available:   very supportive.  Home#  [phone]  Cell#  [phone]    CURRENT CONCERNS Current Concerns  Post-Acute Placement   Other Concerns:    SOCIAL WORK ASSESSMENT / PLAN CSW met with pt and wife to address consult for SNF placement. CSW introduced herself and explained role of social work. CSW also explained process of discharging to SNF.    Pt shared that he has been to [facility] in the past would like to return, if they were to accept him.    CSW will initiate SNF search and follow up with bed offer. CSW will continue to follow to facilitate discharge.   Assessment/plan status:  Psychosocial Support/Ongoing Assessment of Needs Other assessment/ plan:   Information/referral to community resources:   SNF List    PATIENTS/FAMILYS RESPONSE TO PLAN OF CARE: Pt was very pleasant and agreeable to discharge plan.

## 2012-02-15 NOTE — Clinical Social Work Placement (Addendum)
    Clinical Social Work Department CLINICAL SOCIAL WORK PLACEMENT NOTE 02/15/2012  Patient:  [name], [name]  Account Number:  [id number] Admit date:  02/11/2012  Clinical Social Worker:  Peggyann Shoals  Date/time:  02/15/2012 05:09 PM  Clinical Social Work is seeking post-discharge placement for this patient at the following level of care:   SKILLED NURSING   (*CSW will update this form in Epic as items are completed)   02/15/2012  Patient/family provided with [facility] Department of Clinical Social Work's list of facilities offering this level of care within the geographic area requested by the patient (or if unable, by the patient's family).  02/15/2012  Patient/family informed of their freedom to choose among providers that offer the needed level of care, that participate in Medicare, Medicaid or managed care program needed by the patient, have an available bed and are willing to accept the patient.  02/15/2012  Patient/family informed of [facility]' ownership interest in [facility], as well as of the fact that they are under no obligation to receive care at this facility.  PASARR submitted to EDS on existing PASARR PASARR number received from EDS on   FL2 transmitted to all facilities in geographic area requested by pt/family on  02/16/2012 FL2 transmitted to all facilities within larger geographic area on   Patient informed that his/her managed care company has contracts with or will negotiate with  certain facilities, including the following:     Patient/family informed of bed offers received:  02/16/2012 Patient chooses bed at [facility] Physician recommends and patient chooses bed at  [facility]  [facility] to be [facility] to [name] on 02/16/2012 Patient to be transferred to facility by pt's wife  The following physician request were entered in Epic:   Additional Comments:

## 2012-02-16 MED ORDER — HYDROCODONE-ACETAMINOPHEN 5-500 MG PO TABS: 1.0000 | ORAL_TABLET | ORAL | Status: DC | PRN

## 2012-02-16 MED ORDER — WARFARIN SODIUM 4 MG PO TABS: 4.0000 mg | ORAL_TABLET | Freq: Every day | ORAL | Status: DC

## 2012-02-16 MED ORDER — ALPRAZOLAM 0.5 MG PO TABS: 0.5000 mg | ORAL_TABLET | Freq: Every day | ORAL | Status: DC | PRN

## 2012-02-16 NOTE — Progress Notes (Signed)
Pt's insurance will not cover CIR for his diagnosis.  Therefore, no need for CIR consult to be done.  Pt's SW, Maralyn Sago, notified.  She will continue w/ SNF placement as his d/c plan.  [phone]

## 2012-02-16 NOTE — Progress Notes (Signed)
[facility] PROGRESS NOTE   Assessment/Plan: Severe L5S1 spinal stenosis with weakness, inability to ambulate and multiple falls  - Will follow NSG recommendations; PT consult, CIR consulted. - CT myelogram 1.18.2014 - decompressing surgery, laminectomy and diskectomy T12-L1 1.19.2014 - ? When to start Coumadin.  HTN (hypertension)  - Stable, continue avapro and toprol  Anemia of chronic disease: - Stable  - Follow Hgb trend  CKD (chronic kidney disease), stage III: - Appears to be at baseline  - Will monitor  Atrial fibrillation : - Continue metoprolol 25 mg daily, continue to hold coumadin - Restart coumadin once safe from neurosurgery stand point.  BPH  - Continue proscar   Suspicious findings on CT abd for renal cell carcinoma  - CT 1.18.2014 suspicious finding, follow up with his PCP. - further evaluation as soon as acute condition stabilize.    Code Status: Full  Family Communication: discussed with pt  Disposition Plan: pending improvement in clinical status; surgery today for decompression and laminectomy   Consultants:  neursurgery  Procedures:  Laminectomy 1.19.2014  Antibiotics:  None  HPI/Subjective: Improved  lower extremity weakness.   Objective: Filed Vitals:   02/15/12 1823 02/15/12 2200 02/16/12 0200 02/16/12 0600  BP: 120/67 132/74 139/65 127/68  Pulse: 69 74 62 88  Temp: 98 F (36.7 C) 98.4 F (36.9 C) 98.2 F (36.8 C) 98 F (36.7 C)  TempSrc:  Oral Oral Oral  Resp: 20 20 20 20   Height:      Weight:      SpO2: 100% 99% 97% 98%    Intake/Output Summary (Last 24 hours) at 02/16/12 0732 Last data filed at 02/15/12 0842  Gross per 24 hour  Intake    480 ml  Output      0 ml  Net    480 ml   Filed Weights   02/11/12 2245  Weight: 107.5 kg (236 lb 15.9 oz)    Exam:  General: Alert, awake, oriented x3, in no acute distress.  HEENT: No bruits, no goiter.  Heart: Regular rate and rhythm, without murmurs, rubs,  gallops.  Lungs: Good air movement, clear to auscultation Abdomen: Soft, nontender, nondistended, positive bowel sounds.  Neuro: Grossly intact, nonfocal.   Data Reviewed: Basic Metabolic Panel:  Lab 02/14/12 1610 02/14/12 0615 02/12/12 0511 02/11/12 2300 02/11/12 1922  NA 140 140 143 -- 139  K 4.0 4.1 3.6 -- 4.0  CL 104 105 104 -- 99  CO2 25 25 30  -- 31  GLUCOSE 143* 158* 97 -- 130*  BUN 37* 38* 46* -- 45*  CREATININE 1.60* 1.67* 2.00* -- 1.91*  CALCIUM 9.7 9.5 9.3 -- 9.4  MG -- -- -- 2.2 --  PHOS -- -- -- 3.7 --   Liver Function Tests:  Lab 02/12/12 0511  AST 22  ALT 13  ALKPHOS 45  BILITOT 0.3  PROT 5.9*  ALBUMIN 2.9*   No results found for this basename: LIPASE:5,AMYLASE:5 in the last 168 hours No results found for this basename: AMMONIA:5 in the last 168 hours CBC:  Lab 02/14/12 0939 02/12/12 0511 02/11/12 1922  WBC 9.5 6.2 7.2  NEUTROABS -- -- 5.1  HGB 10.9* 10.2* 10.8*  HCT 32.5* 30.5* 32.3*  MCV 95.0 95.6 96.1  PLT 140* 120* 127*   Cardiac Enzymes: No results found for this basename: CKTOTAL:5,CKMB:5,CKMBINDEX:5,TROPONINI:5 in the last 168 hours BNP (last 3 results) No results found for this basename: PROBNP:3 in the last 8760 hours CBG:  Lab 02/15/12 0840 02/14/12 0731  02/13/12 0805 02/12/12 0729  GLUCAP 185* 140* 93 94    Recent Results (from the past 240 hour(s))  SURGICAL PCR SCREEN     Status: Normal   Collection Time   02/14/12 10:05 AM      Component Value Range Status Comment   MRSA, PCR NEGATIVE  NEGATIVE Final    Staphylococcus aureus NEGATIVE  NEGATIVE Final      Studies: Dg Lumbar Spine 2-3 Views  02/14/2012  *RADIOLOGY REPORT*  Clinical Data: Back pain  LUMBAR SPINE - 2-3 VIEW  Comparison: CT myelogram.  Findings: Intraoperative lateral radiograph at 1315 hours demonstrates a needle from a posterior approach directed most closely toward L1.  IMPRESSION: As above.   Original Report Authenticated By: Davonna Belling, M.D.     Scheduled  Meds:    . dexamethasone  4 mg Intravenous Q6H   Or  . dexamethasone  4 mg Oral Q6H  . finasteride  5 mg Oral Daily  . irbesartan  75 mg Oral Daily  . levothyroxine  75 mcg Oral QAC breakfast  . metolazone  2.5 mg Oral Q M,W,F  . metoprolol succinate  12.5 mg Oral Daily  . pantoprazole  40 mg Oral Q1200  . polyethylene glycol  17 g Oral BID  . senna  1 tablet Oral BID  . simvastatin  10 mg Oral q1800  . sodium chloride  3 mL Intravenous Q12H  . sodium chloride  3 mL Intravenous Q12H  . terazosin  5 mg Oral Daily   Continuous Infusions:    . sodium chloride    . 0.9 % NaCl with KCl 20 mEq / Elbert Ewings       Marinda Elk  [facility] Pager [phone].  If 8PM-8AM, please contact night-coverage at [email or web address], password [facility] 02/16/2012, 7:32 AM  LOS: 5 days

## 2012-02-16 NOTE — Progress Notes (Signed)
Physical Therapy Treatment Patient Details Name: [name] MRN: [id number] DOB: 04/23/1930 Today's Date: 02/16/2012 Time: 1137-1202 PT Time Calculation (min): 25 min  PT Assessment / Plan / Recommendation Comments on Treatment Session  Pt progressing however remains to require 2 people for OOB mobilty due to scissored ataxic gait and bilat LE weakness. Pt to strongly benefit from intense inpatient rehab to maximize functional recovery for safe transition home.     Follow Up Recommendations  SNF;Supervision/Assistance - 24 hour     Does the patient have the potential to tolerate intense rehabilitation     Barriers to Discharge        Equipment Recommendations       Recommendations for Other Services    Frequency Min 5X/week   Plan Discharge plan needs to be updated;Frequency needs to be updated    Precautions / Restrictions Precautions Precautions: Fall Restrictions Weight Bearing Restrictions: No   Pertinent Vitals/Pain Pt reports discomfort at surgical incision but denies pain    Mobility  Bed Mobility Bed Mobility: Not assessed Transfers Transfers: Stand to Sit;Sit to Stand Sit to Stand: 1: +1 Total assist;With upper extremity assist;From bed;From chair/3-in-1 (x3) Sit to Stand: Patient Percentage: 70% (to control descent) Stand to Sit: 1: +2 Total assist;With upper extremity assist;To chair/3-in-1 Stand to Sit: Patient Percentage: 60% Details for Transfer Assistance: pt con't to have bilat knee instability and increased difficulty transitioning hands from bed to walker and maintaining balance Ambulation/Gait Ambulation/Gait Assistance: 1: +2 Total assist Ambulation/Gait: Patient Percentage: 70% Ambulation Distance (Feet): 20 Feet (x3) Assistive device: Rolling walker Ambulation/Gait Assistance Details: pt initially with improved gait kinematics however with onset of fatigue pt regressed to have scissored ataxic gait pattern L > R. Pt appears to have L LE drop  foot and L LE adduction and gets L foot caught behind R heel/LE. Pt remains to have decreased R LE weight shift as well. Gait velocity: improved control of speed General Gait Details: v/c's and modA for walker management Stairs: No    Exercises General Exercises - Lower Extremity Long Arc Quad: AROM;Both;10 reps;Seated (with 5 sec hold)   PT Diagnosis:    PT Problem List:   PT Treatment Interventions:     PT Goals Acute Rehab PT Goals PT Goal: Sit to Stand - Progress: Progressing toward goal PT Transfer Goal: Bed to Chair/Chair to Bed - Progress: Progressing toward goal PT Goal: Ambulate - Progress: Progressing toward goal  Visit Information  Last PT Received On: 02/16/12 Assistance Needed: +2    Subjective Data  Subjective: Pt received sitting EOB eager to walk.   Cognition  Overall Cognitive Status: Appears within functional limits for tasks assessed/performed Arousal/Alertness: Awake/alert Behavior During Session: [facility] for tasks performed Cognition - Other Comments: Pt with minimal impulsivity this date.    Balance     End of Session PT - End of Session Equipment Utilized During Treatment: Gait belt Activity Tolerance: Patient limited by fatigue Patient left: in chair;with call bell/phone within reach;with family/visitor present Nurse Communication: Mobility status   GP     Marcene Brawn 02/16/2012, 1:16 PM  Lewis Shock, PT, DPT Pager #: [phone] Office #: [phone]

## 2012-02-16 NOTE — Discharge Summary (Addendum)
Physician Discharge Summary  [name] WUJ:[id number] DOB: 20-Jan-1931 DOA: 02/11/2012  PCP: Malka So., MD  Admit date: 02/11/2012 Discharge date: 02/16/2012  Time spent: 35 minutes  Recommendations for Outpatient Follow-up:  1. Follow up with Neuro as an outpatient. 2.  PCP in 2 week for further evaluation of CT of abdomen and pelvis. Discharge Diagnoses:  Principal Problem:  *Lower extremity weakness Active Problems:  Hip dislocation, right  HTN (hypertension)  Anemia of chronic disease  CKD (chronic kidney disease), stage III  Atrial fibrillation  UTI (lower urinary tract infection)  Spinal stenosis  Hypothyroidism  HLD (hyperlipidemia)  BPH (benign prostatic hyperplasia)   Discharge Condition: stable  Diet recommendation: heart healthy diet  Filed Weights   02/11/12 2245  Weight: 107.5 kg (236 lb 15.9 oz)    History of present illness:  79 year old male with past medical history of severe arthritis and status post right hip replacement, Atrial fibrillation (status post pacemaker and on coumadin), HTN who presented to ED for evaluation of bilateral lower extremity weakness and frequent falls. In addition, patient reports history of lower back pain which is aggravated by standing and somewhat relieved with sitting. Patient reported this as an ongoing problem for past few weeks prior to this admission. No associated urinary or rectal incontinence, no loss of sensation. Patient reports no complaints of chest pain, no shortness of breath, no palpitations. No abdominal pain, no nausea or vomiting. No reports of blood in stool or urine.  In ED, patient was found to have spinal stenosis at L5-S1 based on CT scan lumbar spine. In addition, BMET revealed Creatinine of 1.91 (baseline 2.01 in 12/2011), mild anemia with hemoglobin of 10.8 (in 12/2011 hemoglobin was 11.4) and thrombocytopenia 127   Hospital Course:  Severe L5S1 spinal stenosis with weakness, inability to  ambulate and multiple falls  - Will follow NSG recommendations; PT consult, will go to SNF. - CT myelogram 1.18.2014  - decompressing surgery, laminectomy and diskectomy T12-L1 1.19.2014   HTN (hypertension)  - Stable, continue avapro and toprol   Anemia of chronic disease:  - Stable   CKD (chronic kidney disease), stage III:  - Appears to be at baseline  - Will monitor   Atrial fibrillation :  - Continue metoprolol 25 mg daily, continue to hold coumadin  - Restart on 1.29.2014.  BPH  - Continue proscar   Suspicious findings on CT abd for renal cell carcinoma  - CT 1.18.2014 suspicious finding, follow up with his PCP.  - further evaluation as soon as acute condition stabilize.  - to follow up with oncology as an outpatient.  Thrombocytopenia: - Unclear etiology to follow up with oncology as an outpatient.   Procedures:  laminectomy and diskectomy T12-L1 1.19.2014   Consultations:  Neuro surgery  Discharge Exam: Filed Vitals:   02/15/12 2200 02/16/12 0200 02/16/12 0600 02/16/12 1000  BP: 132/74 139/65 127/68 132/81  Pulse: 74 62 88 95  Temp: 98.4 F (36.9 C) 98.2 F (36.8 C) 98 F (36.7 C) 98.1 F (36.7 C)  TempSrc: Oral Oral Oral Oral  Resp: 20 20 20 18   Height:      Weight:      SpO2: 99% 97% 98% 96%    See  Progress note.  Discharge Instructions      Discharge Orders    Future Orders Please Complete By Expires   Diet - low sodium heart healthy      Increase activity slowly  Medication List     As of 02/16/2012  2:00 PM    STOP taking these medications         cephALEXin 500 MG capsule   Commonly known as: KEFLEX      TAKE these medications         ALPRAZolam 0.5 MG tablet   Commonly known as: XANAX   Take 1 tablet (0.5 mg total) by mouth daily as needed. For anxiety      finasteride 5 MG tablet   Commonly known as: PROSCAR   Take 5 mg by mouth daily.      furosemide 40 MG tablet   Commonly known as: LASIX   Take 40 mg  by mouth daily.      HYDROcodone-acetaminophen 5-500 MG per tablet   Commonly known as: VICODIN   Take 1 tablet by mouth every 4 (four) hours as needed for pain.      levothyroxine 75 MCG tablet   Commonly known as: SYNTHROID, LEVOTHROID   Take 75 mcg by mouth daily.      lovastatin 20 MG tablet   Commonly known as: MEVACOR   Take 40 mg by mouth at bedtime.      metolazone 2.5 MG tablet   Commonly known as: ZAROXOLYN   Take 2.5 mg by mouth every Monday, Wednesday, and Friday. As needed for fluid retention.      metoprolol succinate 25 MG 24 hr tablet   Commonly known as: TOPROL-XL   Take 12.5 mg by mouth daily.      olmesartan 20 MG tablet   Commonly known as: BENICAR   Take 20 mg by mouth daily.      terazosin 5 MG capsule   Commonly known as: HYTRIN   Take 5 mg by mouth daily.      warfarin 4 MG tablet   Commonly known as: COUMADIN   Take 1 tablet (4 mg total) by mouth daily. Hasn't taken since last Thursday due to the anticipation of the CT scan that took place today.  Usually takes daily   Start taking on: 02/24/2012         Follow-up Information    Follow up with JONES,DAVID S, MD. Schedule an appointment as soon as possible for a visit in 2 weeks.   Contact information:   [address] [place] [address] [phone]       Schedule an appointment as soon as possible for a visit with Lajuana Matte., MD.   Contact information:   [address] [place] [address] [phone]           The results of significant diagnostics from this hospitalization (including imaging, microbiology, ancillary and laboratory) are listed below for reference.    Significant Diagnostic Studies: Ct Abdomen Pelvis Wo Contrast  01/20/2012  *RADIOLOGY REPORT*  Clinical Data: Right flank pain, nausea, history of chronic kidney disease  CT ABDOMEN AND PELVIS WITHOUT CONTRAST  Technique:  Multidetector CT imaging of the abdomen and pelvis was performed  following the standard protocol without intravenous contrast.  Comparison: None.  Findings: The lung bases are clear other than a small pleural based nodular opacity medially in the right lower lobe of questionable significance most likely fatty in density.  There is mild cardiomegaly present and a pacer wire is noted.  The liver is somewhat small with no ductal dilatation is seen.  There is higher attenuation debris layering in the neck of the gallbladder which may represent  gallbladder sludge or possibly noncalcified gallstones.  There is fatty infiltration throughout the pancreas. The pancreatic duct is not dilated.  The adrenal glands and spleen are unremarkable.  The stomach is moderately distended with no abnormality noted.  No hydronephrosis is seen.  There is a low attenuation structure emanating from the mid right kidney anterolaterally with an attenuation of 5 HU consistent with cysts measuring 2.3 cm.  In addition there is a more solid appearing exophytic lesion posteriorly in the right mid lateral kidney measuring 13 mm in diameter with an attenuation of 32 HU.  This is worrisome for small renal cell carcinoma.  No hydronephrosis is seen.  No renal calculi are noted.  The abdominal aorta is normal in caliber  The urinary bladder is moderately distended.  The prostate is prominent measuring 4.8 x 5.7 cm with calcifications.  Scattered rectosigmoid colonic diverticula present.  The terminal ileum and the appendix are unremarkable.  No fluid is seen within the pelvis. Linear artifacts from right total hip replacement are noted.  The bones are osteopenic.  Degenerative disc disease is noted diffusely throughout the lumbar spine.  IMPRESSION:  1.  No explanation for the patient's right flank pain is seen. However, there is an exophytic solid appearing lesion emanating from the right mid lateral kidney of 13 mm in diameter suspicious for small renal cell carcinoma. 2.  Gallbladder sludge or noncalcified  gallstones. 3.  Prominent prostate. 4.  Scattered rectosigmoid colonic diverticula.   Original Report Authenticated By: Dwyane Dee, M.D.    Dg Lumbar Spine 2-3 Views  02/14/2012  *RADIOLOGY REPORT*  Clinical Data: Back pain  LUMBAR SPINE - 2-3 VIEW  Comparison: CT myelogram.  Findings: Intraoperative lateral radiograph at 1315 hours demonstrates a needle from a posterior approach directed most closely toward L1.  IMPRESSION: As above.   Original Report Authenticated By: Davonna Belling, M.D.    Ct Thoracic Spine W Contrast  02/13/2012  *RADIOLOGY REPORT*  MYELOGRAM INJECTION  Technique:  Informed consent was obtained from the patient prior to the procedure, including potential complications of headache, allergy, infection and pain. Specific instructions were given regarding 24 hour bedrest post procedure to prevent post-LP headache.  A timeout procedure was performed.  With the patient prone, the lower back was prepped with Betadine.  1% Lidocaine was used for local anesthesia.  Lumbar puncture was performed by the radiologist at the L5-S1 level using a 22 gauge needle with return of clear CSF. I personally performed the lumbar puncture and administered the intrathecal contrast. I also personally supervised acquisition of the myelogram images. 10 ml of Omnipaque-300 was injected into the subarachnoid space after injecting contrast, I injected approximately 3 ml of air in an attempt to help transport the contrast cephalad.  IMPRESSION: Successful injection of  intrathecal contrast for myelography.  MYELOGRAM THORACIC  Clinical Data:  Bilateral leg weakness.  Severe pain.  Comparison: CT myelogram in the lumbar region 02/10/2012.  Findings: The patient had difficulty remaining motionless for the exam and could not cooperate to remain in the lateral decubitus position to maneuver the contrast into the thoracic region. Multilevel spondylosis in the lumbar region hindered transport of the contrast as a bolus.  He was  placed on his back into the head down position, approximately 10 degrees for 2 minutes.  There appeared to be moderate stenosis at the thoracolumbar junction incompletely evaluated. The patient was transported on a stretcher to CT scan for further evaluation.  Fluoroscopy Time: 3 minutes, 1 second  minutes  IMPRESSION: As above.  CT MYELOGRAPHY THORACIC SPINE  Technique: Multidetector CT imaging of the lumbar spine was performed following myelography.  Multiplanar CT image reconstructions were also generated.  Findings:  There is no pneumothorax.  Moderate thoracic atherosclerosis is present.  A pacemaker is present.  Overall opacification of the thoracic subarachnoid space is fair to poor.  The individual disc spaces are examined as follows:   T1-2: Small calcified central protrusion.  No compressive lesion. Extensive calcification anteriorly  T2-3:  Probable anterior fusion.  No posterior protrusion.  T3-4:  Probable anterior fusion.  No posterior protrusion.  T4-5:  Probable into fusion.  No posterior protrusion.  T5-6:  Probable anterior fusion.  No posterior protrusion.  T6-7:  Probable anterior fusion.  No posterior protrusion.  T7-8:  Probable anterior fusion.  Small calcified posterior protrusion is non compressive.  T8-9:  Probable anterior fusion.  No posterior protrusion.  T9-10:  Small calcified protrusion is noted centrally.  No neural compression.  Probable anterior fusion.  T10-11:  No visible anterior fusion.  No posterior protrusion.  T11-12:  Probable anterolateral fusion.  No posterior protrusion.  T12-L1:  There is no visible anterior fusion at this level as was seen at the other levels. There is moderate multifactorial stenosis with vacuum disc phenomenon.  There is a partially calcified central and rightward posterior disc extrusion with slight cephalad migration displacing the cord right-to-left. This is best seen on images 145-148 series 8.  There is considerable ligamentum flavum hypertrophy,  partially calcified, at or slightly below the interspace, with ligamentous infolding resulting in cord flattening and displacement anteriorly ( image 154-155 series 8).  IMPRESSION: Moderate to severe multifactorial stenosis at T12 - L1 related to a central and rightward extrusion as well as ligamentum flavum hypertrophy. Mass effect on the cord relates to the rightward disc extrusion just above the interspace, whereas the ligament flavum mass effect is greatest at the level of to just slightly below the interspace.  Findings discussed with Dr. Yetta Barre.   Original Report Authenticated By: Davonna Belling, M.D.    Ct Lumbar Spine W Contrast  02/13/2012  **ADDENDUM** CREATED: 02/13/2012 09:11:08  Examination of the T12-L1 interspace, not well seen on myelography, shows what appears to be a central and rightward disc extrusion extending into the foramen.  In association with calcified and hypertrophied ligamentum flavum, this appears to cause  mild cord flattening.  Dr. Yetta Barre has requested a thoracic myelogram to further evaluate.  **END ADDENDUM** SIGNED BY: Elsie Stain, M.D.   02/10/2012  *RADIOLOGY REPORT*  MYELOGRAM INJECTION  Technique:  Informed consent was obtained from the patient prior to the procedure, including potential complications of headache, allergy, infection and pain. Specific instructions were given regarding 24 hour bedrest post procedure to prevent post-LP headache.  The patient's prothrombin time was checked and found to be within limits for safe performance of the invasive procedure.  A timeout procedure was performed.  With the patient prone, the lower back was prepped with Betadine.  1% Lidocaine was used for local anesthesia.  Lumbar puncture was performed by the radiologist at the L5-S1 level using a 22 gauge needle with return of clear CSF. I personally performed the lumbar puncture and administered the intrathecal contrast. I also personally supervised acquisition of the myelogram images.  15 cc of Omnipaque 180 was injected into the subarachnoid space .  IMPRESSION: Successful injection of  intrathecal contrast for myelography.  MYELOGRAM LUMBAR  Clinical Data:  Low back  pain. Bilateral leg pain.  Comparison: None.  Findings: Good opacification of the lumbar subarachnoid space. Waist like narrowing at L2-3, L3-4, L4-5, and L5-S1 affects the exiting nerve roots at these levels. The changes are worst at L2-3 on the right, but relatively symmetric at the lower levels.  There is advanced disc space narrowing at L4-5 and L5-S1.  There is fusion anteriorly at L2-3 and L3-4.  Moderate anterior spurring at L1-2 likely results in functional fusion.  Transitional anatomy is present with a narrow S1-S2 disc space which is also fused.  The patient was not able to cooperate for standing flexion/extension views.  Fluoroscopy Time: 49 seconds  IMPRESSION: As above.  CT MYELOGRAPHY LUMBAR SPINE  Technique: Multidetector CT imaging of the lumbar spine was performed following myelography.  Multiplanar CT image reconstructions were also generated.  Findings:  Moderate atheromatous change of the aorta is nonaneurysmal.  No visible paravertebral masses.  Conus slightly low ending midbody L2.  L1-2: Mild facet arthropathy.  Mild annular bulging.  Anterior spurring results in functional fusion.  No neural compression.  L2-3: Calcified central and rightward protrusion with posterior element hypertrophy. Moderate central canal stenosis with right greater than left L3 nerve root encroachment.  Neural foraminal narrowing is unimpressive.  L3-4: Solid interbody fusion.  Slight regrowth of posterior laminectomy. Moderate facet overgrowth.  Solid posterolateral fusion.  Mild central canal stenosis.  Bilateral L4 nerve root encroachment in the lateral recess. Mild neural foraminal narrowing without clear-cut L3 nerve root encroachment.  L4-5: Solid interbody fusion.  Solid posterolateral fusion. Regrowth of posterior elements  status post laminectomy.  Moderate facet overgrowth.  Moderate central canal stenosis with bilateral L5 nerve root encroachment.  Calcified central protrusion. Moderate neural foramen narrowing on the right could affect the L4 nerve root.  L5-S1: Advanced disc space narrowing with vacuum phenomenon. Shallow central protrusion.  Regrowth of posterior laminectomy. Moderate facet overgrowth.  Severe spinal stenosis with bilateral S1 nerve root encroachment.  In addition, there is bilateral neural foraminal narrowing affecting both L5 nerve roots.  IMPRESSION: Transitional anatomy.  See discussion above.  The dominant abnormality is at L5-S1 where multifactorial spinal stenosis as described above affects the L5 and S1 nerve roots bilaterally.  Similar less severe changes at L2-3, L3-4, and L4-5.   Original Report Authenticated By: Davonna Belling, M.D.    Dg Myelogram Thoracic  02/13/2012  *RADIOLOGY REPORT*  MYELOGRAM INJECTION  Technique:  Informed consent was obtained from the patient prior to the procedure, including potential complications of headache, allergy, infection and pain. Specific instructions were given regarding 24 hour bedrest post procedure to prevent post-LP headache.  A timeout procedure was performed.  With the patient prone, the lower back was prepped with Betadine.  1% Lidocaine was used for local anesthesia.  Lumbar puncture was performed by the radiologist at the L5-S1 level using a 22 gauge needle with return of clear CSF. I personally performed the lumbar puncture and administered the intrathecal contrast. I also personally supervised acquisition of the myelogram images. 10 ml of Omnipaque-300 was injected into the subarachnoid space after injecting contrast, I injected approximately 3 ml of air in an attempt to help transport the contrast cephalad.  IMPRESSION: Successful injection of  intrathecal contrast for myelography.  MYELOGRAM THORACIC  Clinical Data:  Bilateral leg weakness.  Severe  pain.  Comparison: CT myelogram in the lumbar region 02/10/2012.  Findings: The patient had difficulty remaining motionless for the exam and could not cooperate to remain in the  lateral decubitus position to maneuver the contrast into the thoracic region. Multilevel spondylosis in the lumbar region hindered transport of the contrast as a bolus.  He was placed on his back into the head down position, approximately 10 degrees for 2 minutes.  There appeared to be moderate stenosis at the thoracolumbar junction incompletely evaluated. The patient was transported on a stretcher to CT scan for further evaluation.  Fluoroscopy Time: 3 minutes, 1 second minutes  IMPRESSION: As above.  CT MYELOGRAPHY THORACIC SPINE  Technique: Multidetector CT imaging of the lumbar spine was performed following myelography.  Multiplanar CT image reconstructions were also generated.  Findings:  There is no pneumothorax.  Moderate thoracic atherosclerosis is present.  A pacemaker is present.  Overall opacification of the thoracic subarachnoid space is fair to poor.  The individual disc spaces are examined as follows:   T1-2: Small calcified central protrusion.  No compressive lesion. Extensive calcification anteriorly  T2-3:  Probable anterior fusion.  No posterior protrusion.  T3-4:  Probable anterior fusion.  No posterior protrusion.  T4-5:  Probable into fusion.  No posterior protrusion.  T5-6:  Probable anterior fusion.  No posterior protrusion.  T6-7:  Probable anterior fusion.  No posterior protrusion.  T7-8:  Probable anterior fusion.  Small calcified posterior protrusion is non compressive.  T8-9:  Probable anterior fusion.  No posterior protrusion.  T9-10:  Small calcified protrusion is noted centrally.  No neural compression.  Probable anterior fusion.  T10-11:  No visible anterior fusion.  No posterior protrusion.  T11-12:  Probable anterolateral fusion.  No posterior protrusion.  T12-L1:  There is no visible anterior fusion at this  level as was seen at the other levels. There is moderate multifactorial stenosis with vacuum disc phenomenon.  There is a partially calcified central and rightward posterior disc extrusion with slight cephalad migration displacing the cord right-to-left. This is best seen on images 145-148 series 8.  There is considerable ligamentum flavum hypertrophy, partially calcified, at or slightly below the interspace, with ligamentous infolding resulting in cord flattening and displacement anteriorly ( image 154-155 series 8).  IMPRESSION: Moderate to severe multifactorial stenosis at T12 - L1 related to a central and rightward extrusion as well as ligamentum flavum hypertrophy. Mass effect on the cord relates to the rightward disc extrusion just above the interspace, whereas the ligament flavum mass effect is greatest at the level of to just slightly below the interspace.  Findings discussed with Dr. Yetta Barre.   Original Report Authenticated By: Davonna Belling, M.D.    Dg Myelogram Lumbar  02/13/2012  **ADDENDUM** CREATED: 02/13/2012 09:11:08  Examination of the T12-L1 interspace, not well seen on myelography, shows what appears to be a central and rightward disc extrusion extending into the foramen.  In association with calcified and hypertrophied ligamentum flavum, this appears to cause  mild cord flattening.  Dr. Yetta Barre has requested a thoracic myelogram to further evaluate.  **END ADDENDUM** SIGNED BY: Elsie Stain, M.D.   02/10/2012  *RADIOLOGY REPORT*  MYELOGRAM INJECTION  Technique:  Informed consent was obtained from the patient prior to the procedure, including potential complications of headache, allergy, infection and pain. Specific instructions were given regarding 24 hour bedrest post procedure to prevent post-LP headache.  The patient's prothrombin time was checked and found to be within limits for safe performance of the invasive procedure.  A timeout procedure was performed.  With the patient prone, the lower  back was prepped with Betadine.  1% Lidocaine was used for local  anesthesia.  Lumbar puncture was performed by the radiologist at the L5-S1 level using a 22 gauge needle with return of clear CSF. I personally performed the lumbar puncture and administered the intrathecal contrast. I also personally supervised acquisition of the myelogram images. 15 cc of Omnipaque 180 was injected into the subarachnoid space .  IMPRESSION: Successful injection of  intrathecal contrast for myelography.  MYELOGRAM LUMBAR  Clinical Data:  Low back pain. Bilateral leg pain.  Comparison: None.  Findings: Good opacification of the lumbar subarachnoid space. Waist like narrowing at L2-3, L3-4, L4-5, and L5-S1 affects the exiting nerve roots at these levels. The changes are worst at L2-3 on the right, but relatively symmetric at the lower levels.  There is advanced disc space narrowing at L4-5 and L5-S1.  There is fusion anteriorly at L2-3 and L3-4.  Moderate anterior spurring at L1-2 likely results in functional fusion.  Transitional anatomy is present with a narrow S1-S2 disc space which is also fused.  The patient was not able to cooperate for standing flexion/extension views.  Fluoroscopy Time: 49 seconds  IMPRESSION: As above.  CT MYELOGRAPHY LUMBAR SPINE  Technique: Multidetector CT imaging of the lumbar spine was performed following myelography.  Multiplanar CT image reconstructions were also generated.  Findings:  Moderate atheromatous change of the aorta is nonaneurysmal.  No visible paravertebral masses.  Conus slightly low ending midbody L2.  L1-2: Mild facet arthropathy.  Mild annular bulging.  Anterior spurring results in functional fusion.  No neural compression.  L2-3: Calcified central and rightward protrusion with posterior element hypertrophy. Moderate central canal stenosis with right greater than left L3 nerve root encroachment.  Neural foraminal narrowing is unimpressive.  L3-4: Solid interbody fusion.  Slight regrowth  of posterior laminectomy. Moderate facet overgrowth.  Solid posterolateral fusion.  Mild central canal stenosis.  Bilateral L4 nerve root encroachment in the lateral recess. Mild neural foraminal narrowing without clear-cut L3 nerve root encroachment.  L4-5: Solid interbody fusion.  Solid posterolateral fusion. Regrowth of posterior elements status post laminectomy.  Moderate facet overgrowth.  Moderate central canal stenosis with bilateral L5 nerve root encroachment.  Calcified central protrusion. Moderate neural foramen narrowing on the right could affect the L4 nerve root.  L5-S1: Advanced disc space narrowing with vacuum phenomenon. Shallow central protrusion.  Regrowth of posterior laminectomy. Moderate facet overgrowth.  Severe spinal stenosis with bilateral S1 nerve root encroachment.  In addition, there is bilateral neural foraminal narrowing affecting both L5 nerve roots.  IMPRESSION: Transitional anatomy.  See discussion above.  The dominant abnormality is at L5-S1 where multifactorial spinal stenosis as described above affects the L5 and S1 nerve roots bilaterally.  Similar less severe changes at L2-3, L3-4, and L4-5.   Original Report Authenticated By: Davonna Belling, M.D.     Microbiology: Recent Results (from the past 240 hour(s))  SURGICAL PCR SCREEN     Status: Normal   Collection Time   02/14/12 10:05 AM      Component Value Range Status Comment   MRSA, PCR NEGATIVE  NEGATIVE Final    Staphylococcus aureus NEGATIVE  NEGATIVE Final      Labs: Basic Metabolic Panel:  Lab 02/14/12 9528 02/14/12 0615 02/12/12 0511 02/11/12 2300 02/11/12 1922  NA 140 140 143 -- 139  K 4.0 4.1 3.6 -- 4.0  CL 104 105 104 -- 99  CO2 25 25 30  -- 31  GLUCOSE 143* 158* 97 -- 130*  BUN 37* 38* 46* -- 45*  CREATININE 1.60* 1.67* 2.00* --  1.91*  CALCIUM 9.7 9.5 9.3 -- 9.4  MG -- -- -- 2.2 --  PHOS -- -- -- 3.7 --   Liver Function Tests:  Lab 02/12/12 0511  AST 22  ALT 13  ALKPHOS 45  BILITOT 0.3    PROT 5.9*  ALBUMIN 2.9*   No results found for this basename: LIPASE:5,AMYLASE:5 in the last 168 hours No results found for this basename: AMMONIA:5 in the last 168 hours CBC:  Lab 02/14/12 0939 02/12/12 0511 02/11/12 1922  WBC 9.5 6.2 7.2  NEUTROABS -- -- 5.1  HGB 10.9* 10.2* 10.8*  HCT 32.5* 30.5* 32.3*  MCV 95.0 95.6 96.1  PLT 140* 120* 127*   Cardiac Enzymes: No results found for this basename: CKTOTAL:5,CKMB:5,CKMBINDEX:5,TROPONINI:5 in the last 168 hours BNP: BNP (last 3 results) No results found for this basename: PROBNP:3 in the last 8760 hours CBG:  Lab 02/16/12 0803 02/15/12 0840 02/14/12 0731 02/13/12 0805 02/12/12 0729  GLUCAP 169* 185* 140* 93 94       Signed:  FELIZ ORTIZ, Laurieann Friddle  [facility] 02/16/2012, 2:00 PM

## 2012-02-16 NOTE — Progress Notes (Signed)
Bladder Scan obtained per Dr. Yetta Barre request; 125 cc left after pt. Voided. Will monitor.

## 2012-02-16 NOTE — Care Management Note (Signed)
    Page 1 of 1   02/16/2012     4:53:24 PM   CARE MANAGEMENT NOTE 02/16/2012  Patient:  [name], [name]   Account Number:  [id number]  Date Initiated:  02/16/2012  Documentation initiated by:  Jacquelynn Cree  Subjective/Objective Assessment:   Admitted with T12-L1 laminectomy     Action/Plan:   PT/OT evals-recommending SNF   Anticipated DC Date:  02/16/2012   Anticipated DC Plan:  SKILLED NURSING FACILITY  In-house referral  Clinical Social Worker      DC Planning Services  CM consult      Choice offered to / List presented to:             Status of service:  Completed, signed off Medicare Important Message given?   (If response is "NO", the following Medicare IM given date fields will be blank) Date Medicare IM given:   Date Additional Medicare IM given:    Discharge Disposition:  SKILLED NURSING FACILITY  Per UR Regulation:  Reviewed for med. necessity/level of care/duration of stay  If discussed at Long Length of Stay Meetings, dates discussed:    Comments:

## 2012-02-16 NOTE — Clinical Social Work Note (Signed)
Clinical Social Work   Pt is ready for discharge to [facility]. Facility has received discharge summary and is ready to admit pt. PT and wife are agreeable to discharge plan. Pt's wife will provide transportation. CSW is signing off as not further needs identified.   Dede Query, MSW, Theresia Majors [phone]

## 2012-02-16 NOTE — Progress Notes (Signed)
Report called to [name]; pt.'s wife is transporting patient.

## 2012-02-16 NOTE — Progress Notes (Signed)
Patient ID: [name], male   DOB: 04-Apr-1930, 79 y.o.   MRN: [id number] Pt doing well with very little pain. Unsteady gait, but good lower ext strength with some decreased coordination. Hopefully to rehab soon.   May resume coumadin in one week.  F/U with me in 3 weeks.

## 2012-02-17 ENCOUNTER — Encounter (HOSPITAL_COMMUNITY): Payer: Self-pay | Admitting: Neurological Surgery

## 2012-02-18 ENCOUNTER — Encounter (HOSPITAL_COMMUNITY): Payer: Self-pay | Admitting: Emergency Medicine

## 2012-03-01 ENCOUNTER — Telehealth: Payer: Self-pay | Admitting: Internal Medicine

## 2012-03-01 NOTE — Telephone Encounter (Signed)
S/W Steward Drone @ [facility] in re NP appt 02/24 @ 1:30 w/Dr. Arbutus Ped.  Marland Kitchen

## 2012-03-01 NOTE — Telephone Encounter (Signed)
LVOM for [name]/[name] to return call.

## 2012-03-02 ENCOUNTER — Telehealth: Payer: Self-pay | Admitting: Internal Medicine

## 2012-03-02 NOTE — Telephone Encounter (Signed)
C/D 03/02/12 for appt.03/21/12 °

## 2012-03-12 ENCOUNTER — Other Ambulatory Visit: Payer: Self-pay

## 2012-03-21 ENCOUNTER — Encounter: Payer: Self-pay | Admitting: Internal Medicine

## 2012-03-21 ENCOUNTER — Ambulatory Visit: Payer: Medicare Other

## 2012-03-21 ENCOUNTER — Telehealth: Payer: Self-pay | Admitting: Internal Medicine

## 2012-03-21 ENCOUNTER — Other Ambulatory Visit (HOSPITAL_BASED_OUTPATIENT_CLINIC_OR_DEPARTMENT_OTHER): Payer: Medicare Other | Admitting: Lab

## 2012-03-21 ENCOUNTER — Ambulatory Visit (HOSPITAL_BASED_OUTPATIENT_CLINIC_OR_DEPARTMENT_OTHER): Payer: Medicare Other | Admitting: Internal Medicine

## 2012-03-21 VITALS — BP 77/50 | HR 80 | Temp 96.9°F | Resp 20 | Ht 71.0 in | Wt 231.8 lb

## 2012-03-21 DIAGNOSIS — N289 Disorder of kidney and ureter, unspecified: Secondary | ICD-10-CM

## 2012-03-21 DIAGNOSIS — D539 Nutritional anemia, unspecified: Secondary | ICD-10-CM

## 2012-03-21 DIAGNOSIS — D649 Anemia, unspecified: Secondary | ICD-10-CM

## 2012-03-21 DIAGNOSIS — D696 Thrombocytopenia, unspecified: Secondary | ICD-10-CM

## 2012-03-21 DIAGNOSIS — D638 Anemia in other chronic diseases classified elsewhere: Secondary | ICD-10-CM

## 2012-03-21 LAB — COMPREHENSIVE METABOLIC PANEL (CC13)
Albumin: 3.2 g/dL — ABNORMAL LOW (ref 3.5–5.0)
BUN: 43.7 mg/dL — ABNORMAL HIGH (ref 7.0–26.0)
CO2: 31 mEq/L — ABNORMAL HIGH (ref 22–29)
Calcium: 9.3 mg/dL (ref 8.4–10.4)
Chloride: 101 mEq/L (ref 98–107)
Creatinine: 1.9 mg/dL — ABNORMAL HIGH (ref 0.7–1.3)
Glucose: 123 mg/dl — ABNORMAL HIGH (ref 70–99)

## 2012-03-21 LAB — CBC WITH DIFFERENTIAL/PLATELET
Eosinophils Absolute: 0.1 10*3/uL (ref 0.0–0.5)
HCT: 32.8 % — ABNORMAL LOW (ref 38.4–49.9)
LYMPH%: 21.2 % (ref 14.0–49.0)
MONO#: 0.7 10*3/uL (ref 0.1–0.9)
NEUT#: 3.9 10*3/uL (ref 1.5–6.5)
Platelets: 167 10*3/uL (ref 140–400)
RBC: 3.45 10*6/uL — ABNORMAL LOW (ref 4.20–5.82)
WBC: 5.9 10*3/uL (ref 4.0–10.3)
lymph#: 1.3 10*3/uL (ref 0.9–3.3)

## 2012-03-21 LAB — LACTATE DEHYDROGENASE (CC13): LDH: 179 U/L (ref 125–245)

## 2012-03-21 NOTE — Patient Instructions (Signed)
Your thrombocytopenia is resolved. Your anemia is most likely anemia of chronic disease. I ordered several studies to evaluate the etiology of her anemia. Followup visit in 2 months.

## 2012-03-21 NOTE — Progress Notes (Signed)
[facility] Telephone:([phone]   [phone]  CONSULT NOTE  REFERRING PHYSICIAN: Dr. Derrell Lolling with [facility].  REASON FOR CONSULTATION:  79 years old white male with thrombocytopenia and anemia.  HPI [name] is a 79 y.o. male was past medical history significant for multiple medical problems including history of hypertension, atrial fibrillation status post pacemaker placement and currently on chronic Coumadin treatment, history of coronary artery disease, history of anemia of chronic disease, hypothyroidism, dyslipidemia, benign prostatic hypertrophy and history of spinal stenosis. The patient mentions that he has been complaining of significant pain in his back. He has thoracic myelogram that showed thoracic stenosis with large right T12-L1 disc herniation. On 02/14/2012, he underwent a thoracic laminectomy of T12 and L1 with right T12-L1 discectomy under the care of Dr. Marikay Alar. During his hospitalization he was noted to have persistent anemia as well as thrombocytopenia. The patient also had CT scan of the abdomen performed on 101/30/202013 that showed an exophytic solid appearing lesion emanating from the right mid lateral kidney measuring 1.3 CM in diameter suspicious for small renal cell carcinoma. He is followed by Dr. Lindley Magnus a urologist at [facility] for this problem and currently on observation. After discharge from the hospital the patient has been 20 days at a rehabilitation facility in [place]. He was referred to me today for evaluation of his persistent thrombocytopenia and anemia. The patient denied having any bleeding issues. He has no significant bruises or ecchymosis. He was treated in the past with iron supplements for questionable iron deficiency anemia but no significant improvement in his hemoglobin and hematocrit. He is feeling much better today. He denied having any significant fatigue or weakness. He denied having any dizzy spells. The  patient denied having any significant chest pain, shortness breath, cough or hemoptysis. He has no rectal bleeding. He is currently on Coumadin 4 mg by mouth daily for the history of atrial fibrillation and pacemaker. The patient is married and has 3 daughters and one stepdaughter. He used to work as [employer] officer. He denied having any history of smoking, alcohol or drug abuse. @SFHPI @  Past Medical History  Diagnosis Date  . Atrial fibrillation     Past Surgical History  Procedure Laterality Date  . Pacemaker insertion    . Lumbar laminectomy/decompression microdiscectomy  02/14/2012    Procedure: LUMBAR LAMINECTOMY/DECOMPRESSION MICRODISCECTOMY 1 LEVEL;  Surgeon: Tia Alert, MD;  Location: [facility];  Service: Neurosurgery;  Laterality: N/A;  Thoracic twelve - Lumbar one decompressive laminectomy.    Family history: Mother had congestive heart and died at age 30. Social History History  Substance Use Topics  . Smoking status: Never Smoker   . Smokeless tobacco: Not on file  . Alcohol Use: No    No Known Allergies  Current Outpatient Prescriptions  Medication Sig Dispense Refill  . ALPRAZolam (XANAX) 0.5 MG tablet Take 1 tablet (0.5 mg total) by mouth daily as needed. For anxiety  5 tablet  0  . finasteride (PROSCAR) 5 MG tablet Take 5 mg by mouth daily.        . furosemide (LASIX) 40 MG tablet Take 40 mg by mouth daily.        Marland Kitchen HYDROcodone-acetaminophen (VICODIN) 5-500 MG per tablet Take 1 tablet by mouth every 4 (four) hours as needed for pain.  5 tablet  0  . levothyroxine (SYNTHROID, LEVOTHROID) 75 MCG tablet Take 75 mcg by mouth daily.        Marland Kitchen lovastatin (  MEVACOR) 20 MG tablet Take 40 mg by mouth at bedtime.        . metolazone (ZAROXOLYN) 2.5 MG tablet Take 2.5 mg by mouth every Monday, Wednesday, and Friday. As needed for fluid retention.      . metoprolol succinate (TOPROL-XL) 25 MG 24 hr tablet Take 12.5 mg by mouth daily.      Marland Kitchen olmesartan (BENICAR) 20 MG  tablet Take 20 mg by mouth daily.      . pioglitazone (ACTOS) 15 MG tablet Take 1 tablet by mouth daily.      Marland Kitchen terazosin (HYTRIN) 5 MG capsule Take 5 mg by mouth daily.        Marland Kitchen warfarin (COUMADIN) 4 MG tablet Take 1 tablet (4 mg total) by mouth daily. Hasn't taken since last Thursday due to the anticipation of the CT scan that took place today.  Usually takes daily       No current facility-administered medications for this visit.    Review of Systems  A comprehensive review of systems was negative.  Physical Exam  WUJ:WJXBJ, healthy, no distress, well nourished and well developed SKIN: skin color, texture, turgor are normal HEAD: Normocephalic, No masses, lesions, tenderness or abnormalities EYES: normal, PERRLA EARS: External ears normal OROPHARYNX:no exudate and no erythema  NECK: supple, no adenopathy LYMPH:  no palpable lymphadenopathy, no hepatosplenomegaly LUNGS: clear to auscultation , and palpation HEART: regular rate & rhythm and no murmurs ABDOMEN:abdomen soft, non-tender, normal bowel sounds and no masses or organomegaly BACK: Back symmetric, no curvature. EXTREMITIES:no edema, no skin discoloration, no clubbing  NEURO: alert & oriented x 3 with fluent speech, no focal motor/sensory deficits  PERFORMANCE STATUS: ECOG 1  LABORATORY DATA: Lab Results  Component Value Date   WBC 5.9 03/21/2012   HGB 11.2* 03/21/2012   HCT 32.8* 03/21/2012   MCV 94.9 03/21/2012   PLT 167 03/21/2012      Chemistry      Component Value Date/Time   NA 140 02/14/2012 0939   K 4.0 02/14/2012 0939   CL 104 02/14/2012 0939   CO2 25 02/14/2012 0939   BUN 37* 02/14/2012 0939   CREATININE 1.60* 02/14/2012 0939      Component Value Date/Time   CALCIUM 9.7 02/14/2012 0939   ALKPHOS 45 02/12/2012 0511   AST 22 02/12/2012 0511   ALT 13 02/12/2012 0511   BILITOT 0.3 02/12/2012 0511       RADIOGRAPHIC STUDIES: No results found.  ASSESSMENT: This is a very pleasant 79 years old white male  with history of thrombocytopenia currently resolved as well as anemia most likely anemia of chronic disease secondary to his multiple medical condition as well as chronic renal insufficiency. The patient also has suspicious right kidney lesion for renal cell carcinoma followed by his urologist.   PLAN: I had a lengthy discussion with the patient today about his condition. His thrombocytopenia was most likely drug-induced and is completely resolved at this point. Regarding his anemia I ordered several studies to identify the etiology of his anemia including repeat CBC, comprehensive to panel, LDH, iron study and ferritin, serum folate and serum erythropoietin in addition to B12 level. The patient will continue on observation for now. If there is any significant abnormalities in his blood work, I will call the patient with my recommendation. He would come back for followup visit in 2 months for reevaluation with repeat CBC. For the renal cell mass, the patient will continue his evaluation with his urologist at [facility].  He was advised to call me immediately if he has any concerning symptoms in the interval. All questions were answered. The patient knows to call the clinic with any problems, questions or concerns. We can certainly see the patient much sooner if necessary.  Thank you so much for allowing me to participate in the care of [name]. I will continue to follow up the patient with you and assist in his care.  I spent 25 minutes counseling the patient face to face. The total time spent in the appointment was 50 minutes.  Kaelani Kendrick K. 03/21/2012, 3:07 PM

## 2012-03-21 NOTE — Telephone Encounter (Signed)
gave pt appt for lab and MD on April 2014 °

## 2012-03-21 NOTE — Progress Notes (Signed)
Checked in new pt with no financial concerns. °

## 2012-03-26 LAB — ERYTHROPOIETIN: Erythropoietin: 15.6 m[IU]/mL (ref 2.6–18.5)

## 2012-03-26 LAB — IRON AND TIBC
%SAT: 39 % (ref 20–55)
TIBC: 214 ug/dL — ABNORMAL LOW (ref 215–435)
UIBC: 130 ug/dL (ref 125–400)

## 2012-03-26 LAB — FOLATE: Folate: >20 ng/mL

## 2012-05-02 ENCOUNTER — Telehealth: Payer: Self-pay | Admitting: Internal Medicine

## 2012-05-19 ENCOUNTER — Ambulatory Visit: Payer: Medicare Other | Admitting: Internal Medicine

## 2012-05-19 ENCOUNTER — Other Ambulatory Visit: Payer: Medicare Other | Admitting: Lab

## 2012-08-25 ENCOUNTER — Inpatient Hospital Stay (HOSPITAL_COMMUNITY): Payer: Medicare Other

## 2012-08-25 ENCOUNTER — Inpatient Hospital Stay (HOSPITAL_COMMUNITY)
Admission: AD | Admit: 2012-08-25 | Discharge: 2012-09-08 | DRG: 466 | Disposition: A | Discharge status: Skilled Nursing Facility | Payer: Medicare Other | Source: Ambulatory Visit | Attending: Internal Medicine | Admitting: Internal Medicine

## 2012-08-25 ENCOUNTER — Encounter (HOSPITAL_COMMUNITY): Payer: Self-pay | Admitting: Neurological Surgery

## 2012-08-25 DIAGNOSIS — Z7901 Long term (current) use of anticoagulants: Secondary | ICD-10-CM

## 2012-08-25 DIAGNOSIS — R7881 Bacteremia: Secondary | ICD-10-CM

## 2012-08-25 DIAGNOSIS — E669 Obesity, unspecified: Secondary | ICD-10-CM | POA: Diagnosis present

## 2012-08-25 DIAGNOSIS — Z113 Encounter for screening for infections with a predominantly sexual mode of transmission: Secondary | ICD-10-CM

## 2012-08-25 DIAGNOSIS — N390 Urinary tract infection, site not specified: Secondary | ICD-10-CM

## 2012-08-25 DIAGNOSIS — T888XXD Other specified complications of surgical and medical care, not elsewhere classified, subsequent encounter: Secondary | ICD-10-CM

## 2012-08-25 DIAGNOSIS — R188 Other ascites: Secondary | ICD-10-CM

## 2012-08-25 DIAGNOSIS — E119 Type 2 diabetes mellitus without complications: Secondary | ICD-10-CM

## 2012-08-25 DIAGNOSIS — E039 Hypothyroidism, unspecified: Secondary | ICD-10-CM | POA: Diagnosis present

## 2012-08-25 DIAGNOSIS — K6819 Other retroperitoneal abscess: Secondary | ICD-10-CM | POA: Diagnosis present

## 2012-08-25 DIAGNOSIS — H02409 Unspecified ptosis of unspecified eyelid: Secondary | ICD-10-CM | POA: Diagnosis present

## 2012-08-25 DIAGNOSIS — D649 Anemia, unspecified: Secondary | ICD-10-CM | POA: Diagnosis present

## 2012-08-25 DIAGNOSIS — K661 Hemoperitoneum: Secondary | ICD-10-CM | POA: Diagnosis present

## 2012-08-25 DIAGNOSIS — I129 Hypertensive chronic kidney disease with stage 1 through stage 4 chronic kidney disease, or unspecified chronic kidney disease: Secondary | ICD-10-CM | POA: Diagnosis present

## 2012-08-25 DIAGNOSIS — E876 Hypokalemia: Secondary | ICD-10-CM

## 2012-08-25 DIAGNOSIS — F411 Generalized anxiety disorder: Secondary | ICD-10-CM | POA: Diagnosis present

## 2012-08-25 DIAGNOSIS — IMO0002 Reserved for concepts with insufficient information to code with codable children: Secondary | ICD-10-CM

## 2012-08-25 DIAGNOSIS — T8450XA Infection and inflammatory reaction due to unspecified internal joint prosthesis, initial encounter: Principal | ICD-10-CM | POA: Diagnosis present

## 2012-08-25 DIAGNOSIS — R791 Abnormal coagulation profile: Secondary | ICD-10-CM

## 2012-08-25 DIAGNOSIS — B9561 Methicillin susceptible Staphylococcus aureus infection as the cause of diseases classified elsewhere: Secondary | ICD-10-CM | POA: Clinically undetermined

## 2012-08-25 DIAGNOSIS — E785 Hyperlipidemia, unspecified: Secondary | ICD-10-CM | POA: Diagnosis present

## 2012-08-25 DIAGNOSIS — T827XXA Infection and inflammatory reaction due to other cardiac and vascular devices, implants and grafts, initial encounter: Secondary | ICD-10-CM | POA: Diagnosis present

## 2012-08-25 DIAGNOSIS — N179 Acute kidney failure, unspecified: Secondary | ICD-10-CM

## 2012-08-25 DIAGNOSIS — M129 Arthropathy, unspecified: Secondary | ICD-10-CM | POA: Diagnosis present

## 2012-08-25 DIAGNOSIS — Z6833 Body mass index (BMI) 33.0-33.9, adult: Secondary | ICD-10-CM

## 2012-08-25 DIAGNOSIS — I4891 Unspecified atrial fibrillation: Secondary | ICD-10-CM

## 2012-08-25 DIAGNOSIS — Z79899 Other long term (current) drug therapy: Secondary | ICD-10-CM

## 2012-08-25 DIAGNOSIS — F3289 Other specified depressive episodes: Secondary | ICD-10-CM | POA: Diagnosis present

## 2012-08-25 DIAGNOSIS — N183 Chronic kidney disease, stage 3 unspecified: Secondary | ICD-10-CM

## 2012-08-25 DIAGNOSIS — T827XXS Infection and inflammatory reaction due to other cardiac and vascular devices, implants and grafts, sequela: Secondary | ICD-10-CM

## 2012-08-25 DIAGNOSIS — B20 Human immunodeficiency virus [HIV] disease: Secondary | ICD-10-CM

## 2012-08-25 DIAGNOSIS — I10 Essential (primary) hypertension: Secondary | ICD-10-CM

## 2012-08-25 DIAGNOSIS — Y831 Surgical operation with implant of artificial internal device as the cause of abnormal reaction of the patient, or of later complication, without mention of misadventure at the time of the procedure: Secondary | ICD-10-CM | POA: Diagnosis present

## 2012-08-25 DIAGNOSIS — D72829 Elevated white blood cell count, unspecified: Secondary | ICD-10-CM

## 2012-08-25 DIAGNOSIS — T888XXA Other specified complications of surgical and medical care, not elsewhere classified, initial encounter: Secondary | ICD-10-CM

## 2012-08-25 DIAGNOSIS — L03119 Cellulitis of unspecified part of limb: Secondary | ICD-10-CM | POA: Diagnosis present

## 2012-08-25 DIAGNOSIS — F329 Major depressive disorder, single episode, unspecified: Secondary | ICD-10-CM | POA: Diagnosis present

## 2012-08-25 DIAGNOSIS — B9689 Other specified bacterial agents as the cause of diseases classified elsewhere: Secondary | ICD-10-CM | POA: Diagnosis present

## 2012-08-25 DIAGNOSIS — T8451XD Infection and inflammatory reaction due to internal right hip prosthesis, subsequent encounter: Secondary | ICD-10-CM

## 2012-08-25 DIAGNOSIS — M4800 Spinal stenosis, site unspecified: Secondary | ICD-10-CM

## 2012-08-25 DIAGNOSIS — E871 Hypo-osmolality and hyponatremia: Secondary | ICD-10-CM | POA: Diagnosis not present

## 2012-08-25 DIAGNOSIS — K683 Retroperitoneal hematoma: Secondary | ICD-10-CM | POA: Diagnosis present

## 2012-08-25 DIAGNOSIS — R29898 Other symptoms and signs involving the musculoskeletal system: Secondary | ICD-10-CM

## 2012-08-25 DIAGNOSIS — Z96649 Presence of unspecified artificial hip joint: Secondary | ICD-10-CM

## 2012-08-25 DIAGNOSIS — L02419 Cutaneous abscess of limb, unspecified: Secondary | ICD-10-CM | POA: Diagnosis present

## 2012-08-25 DIAGNOSIS — D696 Thrombocytopenia, unspecified: Secondary | ICD-10-CM

## 2012-08-25 DIAGNOSIS — A4901 Methicillin susceptible Staphylococcus aureus infection, unspecified site: Secondary | ICD-10-CM

## 2012-08-25 DIAGNOSIS — Z950 Presence of cardiac pacemaker: Secondary | ICD-10-CM

## 2012-08-25 HISTORY — DX: Essential (primary) hypertension: I10

## 2012-08-25 HISTORY — DX: Type 2 diabetes mellitus without complications: E11.9

## 2012-08-25 HISTORY — DX: Unspecified osteoarthritis, unspecified site: M19.90

## 2012-08-25 HISTORY — DX: Depression, unspecified: F32.A

## 2012-08-25 HISTORY — DX: Major depressive disorder, single episode, unspecified: F32.9

## 2012-08-25 HISTORY — DX: Anxiety disorder, unspecified: F41.9

## 2012-08-25 LAB — CBC
Platelets: 207 10*3/uL (ref 150–400)
RBC: 3.37 MIL/uL — ABNORMAL LOW (ref 4.22–5.81)
RDW: 15.5 % (ref 11.5–15.5)
WBC: 22.7 10*3/uL — ABNORMAL HIGH (ref 4.0–10.5)

## 2012-08-25 LAB — PROTIME-INR
INR: 7.19 (ref 0.00–1.49)
Prothrombin Time: 58.6 seconds — ABNORMAL HIGH (ref 11.6–15.2)

## 2012-08-25 LAB — BASIC METABOLIC PANEL
CO2: 33 mEq/L — ABNORMAL HIGH (ref 19–32)
Calcium: 8.9 mg/dL (ref 8.4–10.5)
Chloride: 85 mEq/L — ABNORMAL LOW (ref 96–112)
Creatinine, Ser: 2.8 mg/dL — ABNORMAL HIGH (ref 0.50–1.35)
GFR calc Af Amer: 23 mL/min — ABNORMAL LOW (ref >90)
Sodium: 131 mEq/L — ABNORMAL LOW (ref 135–145)

## 2012-08-25 MED ORDER — ACETAMINOPHEN 650 MG RE SUPP: 650.0000 mg | RECTAL | Status: DC | PRN

## 2012-08-25 MED ORDER — ONDANSETRON HCL 4 MG/2ML IJ SOLN
4.0000 mg | INTRAMUSCULAR | Status: DC | PRN
  Administered 2012-08-27: 4 mg via INTRAVENOUS
  Filled 2012-08-25: qty 2

## 2012-08-25 MED ORDER — PHENOL 1.4 % MT LIQD: 1.0000 | OROMUCOSAL | Status: DC | PRN

## 2012-08-25 MED ORDER — IRBESARTAN 75 MG PO TABS
75.0000 mg | ORAL_TABLET | Freq: Every day | ORAL | Status: DC
  Filled 2012-08-25 (×2): qty 1

## 2012-08-25 MED ORDER — LEVOTHYROXINE SODIUM 75 MCG PO TABS
75.0000 ug | ORAL_TABLET | Freq: Every day | ORAL | Status: DC
  Administered 2012-08-27 – 2012-09-08 (×12): 75 ug via ORAL
  Filled 2012-08-25 (×15): qty 1

## 2012-08-25 MED ORDER — METOPROLOL SUCCINATE 12.5 MG HALF TABLET
12.5000 mg | ORAL_TABLET | Freq: Every day | ORAL | Status: DC
  Filled 2012-08-25 (×2): qty 1

## 2012-08-25 MED ORDER — OXYCODONE-ACETAMINOPHEN 5-325 MG PO TABS
1.0000 | ORAL_TABLET | ORAL | Status: DC | PRN
  Administered 2012-08-25 – 2012-08-27 (×3): 2 via ORAL
  Administered 2012-08-28 (×2): 1 via ORAL
  Administered 2012-08-29 – 2012-09-04 (×6): 2 via ORAL
  Filled 2012-08-25 (×8): qty 2
  Filled 2012-08-25: qty 1
  Filled 2012-08-25: qty 2
  Filled 2012-08-25: qty 1

## 2012-08-25 MED ORDER — MENTHOL 3 MG MT LOZG: 1.0000 | LOZENGE | OROMUCOSAL | Status: DC | PRN

## 2012-08-25 MED ORDER — FUROSEMIDE 40 MG PO TABS
40.0000 mg | ORAL_TABLET | Freq: Every day | ORAL | Status: DC
  Filled 2012-08-25 (×2): qty 1

## 2012-08-25 MED ORDER — TERAZOSIN HCL 5 MG PO CAPS
5.0000 mg | ORAL_CAPSULE | Freq: Every day | ORAL | Status: DC
  Filled 2012-08-25 (×2): qty 1

## 2012-08-25 MED ORDER — FINASTERIDE 5 MG PO TABS
5.0000 mg | ORAL_TABLET | Freq: Every day | ORAL | Status: DC
  Administered 2012-08-27 – 2012-09-08 (×13): 5 mg via ORAL
  Filled 2012-08-25 (×15): qty 1

## 2012-08-25 MED ORDER — SODIUM CHLORIDE 0.9 % IJ SOLN
3.0000 mL | Freq: Two times a day (BID) | INTRAMUSCULAR | Status: DC
  Administered 2012-08-25: 3 mL via INTRAVENOUS

## 2012-08-25 MED ORDER — SODIUM CHLORIDE 0.9 % IJ SOLN: 3.0000 mL | INTRAMUSCULAR | Status: DC | PRN

## 2012-08-25 MED ORDER — PHYTONADIONE 5 MG PO TABS
10.0000 mg | ORAL_TABLET | Freq: Four times a day (QID) | ORAL | Status: DC
  Administered 2012-08-25: 10 mg via ORAL
  Filled 2012-08-25 (×5): qty 2

## 2012-08-25 MED ORDER — METOLAZONE 2.5 MG PO TABS
2.5000 mg | ORAL_TABLET | ORAL | Status: DC
  Administered 2012-08-29 – 2012-09-07 (×5): 2.5 mg via ORAL
  Filled 2012-08-25 (×6): qty 1

## 2012-08-25 MED ORDER — ALPRAZOLAM 0.5 MG PO TABS
0.5000 mg | ORAL_TABLET | Freq: Every day | ORAL | Status: DC | PRN
  Administered 2012-08-30 – 2012-09-02 (×3): 0.5 mg via ORAL
  Filled 2012-08-25 (×3): qty 1

## 2012-08-25 MED ORDER — POTASSIUM CHLORIDE IN NACL 20-0.9 MEQ/L-% IV SOLN
INTRAVENOUS | Status: DC
  Administered 2012-08-25: 22:00:00 via INTRAVENOUS
  Filled 2012-08-25 (×3): qty 1000

## 2012-08-25 MED ORDER — POTASSIUM CHLORIDE CRYS ER 20 MEQ PO TBCR
80.0000 meq | EXTENDED_RELEASE_TABLET | Freq: Once | ORAL | Status: AC
  Administered 2012-08-25: 80 meq via ORAL
  Filled 2012-08-25: qty 4

## 2012-08-25 MED ORDER — ACETAMINOPHEN 325 MG PO TABS: 650.0000 mg | ORAL_TABLET | ORAL | Status: DC | PRN

## 2012-08-25 MED ORDER — MORPHINE SULFATE 2 MG/ML IJ SOLN
1.0000 mg | INTRAMUSCULAR | Status: DC | PRN
  Administered 2012-08-27: 4 mg via INTRAVENOUS
  Administered 2012-09-02: 2 mg via INTRAVENOUS
  Filled 2012-08-25: qty 1
  Filled 2012-08-25: qty 2

## 2012-08-25 MED ORDER — SODIUM CHLORIDE 0.9 % IV SOLN: 250.0000 mL | INTRAVENOUS | Status: DC

## 2012-08-25 MED ORDER — PIOGLITAZONE HCL 15 MG PO TABS
15.0000 mg | ORAL_TABLET | Freq: Every day | ORAL | Status: DC
  Filled 2012-08-25 (×2): qty 1

## 2012-08-25 NOTE — Progress Notes (Signed)
CRITICAL VALUE ALERT  Critical value received:  INR 7.19  Date of notification:  08/25/2012  Time of notification:  2218  Critical value read back:yes  Nurse who received alert:  Merri Ray, RN  MD notified (1st page):  Dr. Franky Macho  Time of first page:  2213  MD notified (2nd page):  Time of second page:  Responding MD:  Dr. Franky Macho  Time MD responded:  2220

## 2012-08-25 NOTE — Progress Notes (Signed)
CRITICAL VALUE ALERT  Critical value received:  Potassium - 2.6  Date of notification:  08/25/2012  Time of notification:  2211  Critical value read back:yes  Nurse who received alert:  Merri Ray, RN  MD notified (1st page):  Dr. Franky Macho  Time of first page:  2213  MD notified (2nd page):  Time of second page:  Responding MD:  Dr. Franky Macho  Time MD responded:  2220

## 2012-08-25 NOTE — H&P (Signed)
Subjective: Patient is a 79 y.o. male who complains of back pain and severe RLE weakness for one week.. Onset of symptoms was 1 week  ago, progressively worse since that time.  Onset was not related to an injury. The pain is rated severe, and is located at the low back and radiates to the right hip and groin and leg. The pain is described as aching and occurs often . Symptoms are exacerbated by activity. The patient has tried meds and rest. Saw his ortho who evaluated his r hip replacement.  Past Medical History  Diagnosis Date  . Atrial fibrillation     Past Surgical History  Procedure Laterality Date  . Pacemaker insertion    . Lumbar laminectomy/decompression microdiscectomy  02/14/2012    Procedure: LUMBAR LAMINECTOMY/DECOMPRESSION MICRODISCECTOMY 1 LEVEL;  Surgeon: Tia Alert, MD;  Location: [facility];  Service: Neurosurgery;  Laterality: N/A;  Thoracic twelve - Lumbar one decompressive laminectomy.    No Known Allergies  History  Substance Use Topics  . Smoking status: Never Smoker   . Smokeless tobacco: Not on file  . Alcohol Use: No    No family history on file. Prior to Admission medications   Medication Sig Start Date End Date Taking? Authorizing Provider  ALPRAZolam Prudy Feeler) 0.5 MG tablet Take 1 tablet (0.5 mg total) by mouth daily as needed. For anxiety 02/16/12  Yes Marinda Elk, MD  diphenhydramine-acetaminophen (TYLENOL PM) 25-500 MG TABS Take 1 tablet by mouth at bedtime as needed.   Yes Historical Provider, MD  finasteride (PROSCAR) 5 MG tablet Take 5 mg by mouth daily.     Yes Historical Provider, MD  furosemide (LASIX) 40 MG tablet Take 40 mg by mouth daily.     Yes Historical Provider, MD  HYDROcodone-acetaminophen (VICODIN) 5-500 MG per tablet Take 1 tablet by mouth every 4 (four) hours as needed for pain. 02/16/12  Yes Marinda Elk, MD  levothyroxine (SYNTHROID, LEVOTHROID) 75 MCG tablet Take 75 mcg by mouth daily.     Yes Historical Provider, MD   lovastatin (MEVACOR) 20 MG tablet Take 40 mg by mouth at bedtime.     Yes Historical Provider, MD  metolazone (ZAROXOLYN) 2.5 MG tablet Take 2.5 mg by mouth every Monday, Wednesday, and Friday. As needed for fluid retention.   Yes Historical Provider, MD  metoprolol succinate (TOPROL-XL) 25 MG 24 hr tablet Take 12.5 mg by mouth daily.   Yes Historical Provider, MD  pioglitazone (ACTOS) 15 MG tablet Take 1 tablet by mouth daily. 03/10/12  Yes Historical Provider, MD  terazosin (HYTRIN) 5 MG capsule Take 5 mg by mouth daily.     Yes Historical Provider, MD  warfarin (COUMADIN) 4 MG tablet Take 1 tablet (4 mg total) by mouth daily. Hasn't taken since last Thursday due to the anticipation of the CT scan that took place today.  Usually takes daily 02/24/12  Yes Marinda Elk, MD     Review of Systems  Positive ROS: neg  All other systems have been reviewed and were otherwise negative with the exception of those mentioned in the HPI and as above.  Objective: Vital signs in last 24 hours:    General Appearance: Alert, cooperative, in mod distress Head: Normocephalic, without obvious abnormality, atraumatic Eyes: PERRL, conjunctiva/corneas clear, EOM's intact Neck: Supple, symmetrical, trachea midline Lungs: respirations unlabored Heart: Regular rate and rhythm Abdomen: Soft, non-tender Extremities: Extremities normal, atraumatic, no cyanosis or edema Pulses: 2+ and symmetric all extremities Skin: Skin color,  texture, turgor normal, no rashes or lesions  NEUROLOGIC:   Mental status: alert and oriented, no aphasia, good attention span, Fund of knowledge/ memory ok Motor Exam - profoundly weak prox RLE, LLE ok Sensory Exam - grossly normal Reflexes: trace Coordination - grossly normal UE Gait - not tested Balance - not tested Cranial Nerves: I: smell Not tested  II: visual acuity  OS: na  OD: na  II: visual fields Full to confrontation  II: pupils Equal, round, reactive to light   III,VII: ptosis None  III,IV,VI: extraocular muscles  Full ROM  V: mastication Normal  V: facial light touch sensation  Normal  V,VII: corneal reflex  Present  VII: facial muscle function - upper  Normal  VII: facial muscle function - lower Normal  VIII: hearing Not tested  IX: soft palate elevation  Normal  IX,X: gag reflex Present  XI: trapezius strength  5/5  XI: sternocleidomastoid strength 5/5  XI: neck flexion strength  5/5  XII: tongue strength  Normal    Data Review Lab Results  Component Value Date   WBC 5.9 03/21/2012   HGB 11.2* 03/21/2012   HCT 32.8* 03/21/2012   MCV 94.9 03/21/2012   PLT 167 03/21/2012   Lab Results  Component Value Date   NA 143 03/21/2012   K 4.0 03/21/2012   CL 101 03/21/2012   CO2 31* 03/21/2012   BUN 43.7 Repeated and Verified* 03/21/2012   CREATININE 1.9 Repeated and Verified* 03/21/2012   GLUCOSE 123* 03/21/2012   Lab Results  Component Value Date   INR 1.27 02/14/2012    Assessment/Plan: Admit for w/u. MRI L and T spine. 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 Suspect recurrent disk.   Mosetta Ferdinand S 08/25/2012 7:35 PM

## 2012-08-26 ENCOUNTER — Inpatient Hospital Stay (HOSPITAL_COMMUNITY): Payer: Medicare Other

## 2012-08-26 DIAGNOSIS — E119 Type 2 diabetes mellitus without complications: Secondary | ICD-10-CM | POA: Diagnosis present

## 2012-08-26 DIAGNOSIS — D72829 Elevated white blood cell count, unspecified: Secondary | ICD-10-CM

## 2012-08-26 DIAGNOSIS — N390 Urinary tract infection, site not specified: Secondary | ICD-10-CM

## 2012-08-26 DIAGNOSIS — E876 Hypokalemia: Secondary | ICD-10-CM

## 2012-08-26 DIAGNOSIS — R791 Abnormal coagulation profile: Secondary | ICD-10-CM | POA: Diagnosis present

## 2012-08-26 DIAGNOSIS — N179 Acute kidney failure, unspecified: Secondary | ICD-10-CM | POA: Diagnosis present

## 2012-08-26 LAB — CBC
HCT: 29.7 % — ABNORMAL LOW (ref 39.0–52.0)
Hemoglobin: 10.3 g/dL — ABNORMAL LOW (ref 13.0–17.0)
MCHC: 34.7 g/dL (ref 30.0–36.0)
RBC: 3.41 MIL/uL — ABNORMAL LOW (ref 4.22–5.81)
WBC: 23.7 10*3/uL — ABNORMAL HIGH (ref 4.0–10.5)

## 2012-08-26 LAB — URINALYSIS, ROUTINE W REFLEX MICROSCOPIC
Glucose, UA: NEGATIVE mg/dL
Ketones, ur: NEGATIVE mg/dL
Nitrite: NEGATIVE
Specific Gravity, Urine: 1.014 (ref 1.005–1.030)
pH: 5 (ref 5.0–8.0)

## 2012-08-26 LAB — PROTIME-INR: INR: 5.95 (ref 0.00–1.49)

## 2012-08-26 LAB — DIFFERENTIAL
Lymphs Abs: 0.8 10*3/uL (ref 0.7–4.0)
Monocytes Absolute: 0.9 10*3/uL (ref 0.1–1.0)
Monocytes Relative: 4 % (ref 3–12)
Neutro Abs: 20.9 10*3/uL — ABNORMAL HIGH (ref 1.7–7.7)
Neutrophils Relative %: 92 % — ABNORMAL HIGH (ref 43–77)

## 2012-08-26 LAB — BASIC METABOLIC PANEL
CO2: 34 mEq/L — ABNORMAL HIGH (ref 19–32)
Chloride: 90 mEq/L — ABNORMAL LOW (ref 96–112)
Creatinine, Ser: 2.07 mg/dL — ABNORMAL HIGH (ref 0.50–1.35)
Glucose, Bld: 146 mg/dL — ABNORMAL HIGH (ref 70–99)
Sodium: 136 mEq/L (ref 135–145)

## 2012-08-26 LAB — URINE MICROSCOPIC-ADD ON

## 2012-08-26 LAB — COMPREHENSIVE METABOLIC PANEL
ALT: 78 U/L — ABNORMAL HIGH (ref 0–53)
AST: 192 U/L — ABNORMAL HIGH (ref 0–37)
Alkaline Phosphatase: 89 U/L (ref 39–117)
CO2: 33 mEq/L — ABNORMAL HIGH (ref 19–32)
Calcium: 8.6 mg/dL (ref 8.4–10.5)
Chloride: 91 mEq/L — ABNORMAL LOW (ref 96–112)
GFR calc Af Amer: 26 mL/min — ABNORMAL LOW (ref >90)
GFR calc non Af Amer: 23 mL/min — ABNORMAL LOW (ref >90)
Glucose, Bld: 139 mg/dL — ABNORMAL HIGH (ref 70–99)
Potassium: 2.7 mEq/L — CL (ref 3.5–5.1)
Sodium: 134 mEq/L — ABNORMAL LOW (ref 135–145)

## 2012-08-26 LAB — TYPE AND SCREEN: Antibody Screen: NEGATIVE

## 2012-08-26 LAB — GLUCOSE, CAPILLARY

## 2012-08-26 MED ORDER — METOPROLOL SUCCINATE 12.5 MG HALF TABLET
12.5000 mg | ORAL_TABLET | Freq: Every day | ORAL | Status: DC
  Administered 2012-08-28 – 2012-09-08 (×12): 12.5 mg via ORAL
  Filled 2012-08-26 (×13): qty 1

## 2012-08-26 MED ORDER — INSULIN ASPART 100 UNIT/ML ~~LOC~~ SOLN
0.0000 [IU] | Freq: Three times a day (TID) | SUBCUTANEOUS | Status: DC
  Administered 2012-08-27 (×2): 2 [IU] via SUBCUTANEOUS
  Administered 2012-08-27: 1 [IU] via SUBCUTANEOUS
  Administered 2012-08-28 – 2012-09-06 (×9): 2 [IU] via SUBCUTANEOUS
  Administered 2012-09-07 – 2012-09-08 (×2): 1 [IU] via SUBCUTANEOUS

## 2012-08-26 MED ORDER — ACETAMINOPHEN 325 MG PO TABS
650.0000 mg | ORAL_TABLET | Freq: Once | ORAL | Status: AC
  Administered 2012-08-26: 650 mg via ORAL
  Filled 2012-08-26: qty 2

## 2012-08-26 MED ORDER — POTASSIUM CHLORIDE CRYS ER 20 MEQ PO TBCR
40.0000 meq | EXTENDED_RELEASE_TABLET | ORAL | Status: DC
  Administered 2012-08-26: 40 meq via ORAL
  Filled 2012-08-26 (×2): qty 2

## 2012-08-26 MED ORDER — DEXTROSE 5 % IV SOLN
1.0000 g | INTRAVENOUS | Status: DC
  Administered 2012-08-26 – 2012-08-27 (×2): 1 g via INTRAVENOUS
  Filled 2012-08-26 (×2): qty 10

## 2012-08-26 MED ORDER — SODIUM CHLORIDE 0.9 % IV SOLN: INTRAVENOUS | Status: DC

## 2012-08-26 MED ORDER — POTASSIUM CHLORIDE CRYS ER 20 MEQ PO TBCR
60.0000 meq | EXTENDED_RELEASE_TABLET | ORAL | Status: AC
  Administered 2012-08-26 – 2012-08-27 (×2): 60 meq via ORAL
  Filled 2012-08-26 (×2): qty 3

## 2012-08-26 MED ORDER — VITAMIN K1 10 MG/ML IJ SOLN
5.0000 mg | Freq: Once | INTRAVENOUS | Status: AC
  Administered 2012-08-26: 5 mg via INTRAVENOUS
  Filled 2012-08-26: qty 0.5

## 2012-08-26 NOTE — Consult Note (Addendum)
[facility] Medical Consultation  [name] FAO:[id number] DOB: 1930/07/31 DOA: 08/25/2012 [name] is an 79 y.o. male.    PCP: Malka So., MD     Requesting physician: Dr. Yetta Barre Date of consultation: 08/26/2012 Reason for consultation: Abnormal labs  Chief Complaint: Weakness in the right leg  HPI: This is a 79 year old, Caucasian male, with a past medical history of atrial fibrillation on warfarin, BPH, status post pacemaker placement in the past, hypercholesterolemia, diabetes who was in his usual state of health till this past Tuesday, when he started noticing pain in the right leg. The pain was located in the entire leg and in the lower back towards the right. He underwent Thoracic laminectomy in January of this year. He, said he was progressing quite well. Initially, he required a cane and a walker, but had become almost independent with ambulation. However, in the last week or so, because of his right leg weakness and pain he had been using his cane and walker. Denies any falls. Denies any syncopal episodes. No dizziness. No nausea, vomiting. No headaches. Denies any chest pain or shortness of breath. Denies any urinary incontinence or retention. Has been constipated since this Tuesday. Denies any blood in the stools or bleeding anywhere else. He has been taking hydrocodone at home with partial relief. Has had some chills but denies any fever at home. So, he went to see Dr. Yetta Barre in his office and he was directly admitted last night to undergo evaluation for his right leg weakness. We were called this morning as he was found to be in acute on chronic renal failure as well and had the low potassium. He was also found to have an elevated INR.   Home Medications: Prior to Admission medications   Medication Sig Start Date End Date Taking? Authorizing Provider  ALPRAZolam Prudy Feeler) 0.5 MG tablet Take 1 tablet (0.5 mg total) by mouth daily as needed. For anxiety 02/16/12  Yes  Marinda Elk, MD  diphenhydramine-acetaminophen (TYLENOL PM) 25-500 MG TABS Take 1 tablet by mouth at bedtime as needed.   Yes Historical Provider, MD  finasteride (PROSCAR) 5 MG tablet Take 5 mg by mouth daily.     Yes Historical Provider, MD  furosemide (LASIX) 40 MG tablet Take 40 mg by mouth daily.     Yes Historical Provider, MD  HYDROcodone-acetaminophen (VICODIN) 5-500 MG per tablet Take 1 tablet by mouth every 4 (four) hours as needed for pain. 02/16/12  Yes Marinda Elk, MD  levothyroxine (SYNTHROID, LEVOTHROID) 75 MCG tablet Take 75 mcg by mouth daily.     Yes Historical Provider, MD  lovastatin (MEVACOR) 20 MG tablet Take 40 mg by mouth at bedtime.     Yes Historical Provider, MD  metolazone (ZAROXOLYN) 2.5 MG tablet Take 2.5 mg by mouth every Monday, Wednesday, and Friday. As needed for fluid retention.   Yes Historical Provider, MD  metoprolol succinate (TOPROL-XL) 25 MG 24 hr tablet Take 12.5 mg by mouth daily.   Yes Historical Provider, MD  pioglitazone (ACTOS) 15 MG tablet Take 1 tablet by mouth daily. 03/10/12  Yes Historical Provider, MD  terazosin (HYTRIN) 5 MG capsule Take 5 mg by mouth daily.     Yes Historical Provider, MD  warfarin (COUMADIN) 4 MG tablet Take 1 tablet (4 mg total) by mouth daily. Hasn't taken since last Thursday due to the anticipation of the CT scan that took place today.  Usually takes daily 02/24/12  Yes Marinda Elk, MD  Allergies: No Known Allergies  Past Medical History: Past Medical History  Diagnosis Date  . Atrial fibrillation   . Hypertension   . Pacemaker   . Diabetes mellitus without complication     Borderline  . Arthritis   . Anxiety   . Depression     Past Surgical History  Procedure Laterality Date  . Pacemaker insertion    . Lumbar laminectomy/decompression microdiscectomy  02/14/2012    Procedure: LUMBAR LAMINECTOMY/DECOMPRESSION MICRODISCECTOMY 1 LEVEL;  Surgeon: Tia Alert, MD;  Location: [facility];   Service: Neurosurgery;  Laterality: N/A;  Thoracic twelve - Lumbar one decompressive laminectomy.    Social History:   reports that he has never smoked. He does not have any smokeless tobacco history on file. He reports that he does not drink alcohol or use illicit drugs.  Living Situation: He lives in [place] with his wife Activity Level: As mentioned above   Family History: There is history of arthritis in the family  Review of Systems - History obtained from the patient General ROS: positive for  - fatigue Psychological ROS: negative Ophthalmic ROS: negative ENT ROS: negative Allergy and Immunology ROS: negative Hematological and Lymphatic ROS: negative Endocrine ROS: negative Respiratory ROS: no cough, shortness of breath, or wheezing Cardiovascular ROS: no chest pain or dyspnea on exertion Gastrointestinal ROS: positive for - constipation Genito-Urinary ROS: no dysuria, trouble voiding, or hematuria Musculoskeletal ROS: as in hpi Neurological ROS: as in hpi Dermatological ROS: negative  Physical Examination: Filed Vitals:   08/26/12 1400 08/26/12 1405 08/26/12 1425 08/26/12 1535  BP: 98/61  110/54 110/53  Pulse: 93  90 90  Temp: 98.5 F (36.9 C)  97.9 F (36.6 C) 98.7 F (37.1 C)  TempSrc: Oral  Oral Oral  Resp: 20  18 20   Height:      Weight:      SpO2:  95%  96%    General appearance: alert, cooperative, no distress and moderately obese Head: Normocephalic, without obvious abnormality, atraumatic Eyes: conjunctivae/corneas clear. PERRL, EOM's intact.  Throat: lips, mucosa, and tongue normal; teeth and gums normal Neck: no adenopathy, no carotid bruit, no JVD, supple, symmetrical, trachea midline and thyroid not enlarged, symmetric, no tenderness/mass/nodules Resp: clear to auscultation bilaterally Cardio: regular rate and rhythm, S1, S2 normal, no murmur, click, rub or gallop GI: soft, found to be tender on right side. No rebound, rigidity, or guarding. No  masses, or organomegaly.  Right upper quadrant wasn't particularly tender. bowel sounds normal; no masses,  no organomegaly Extremities: extremities normal, atraumatic, no cyanosis or edema Pulses: 2+ and symmetric Skin: Skin color, texture, turgor normal. No rashes or lesions Lymph nodes: Cervical, supraclavicular, and axillary nodes normal. Neurologic: Is alert and oriented x3. Cranial nerves intact. Motor strength in the upper extremities is normal and equal bilaterally. Motor strength in her left lower extremity is 5 out of 5 all muscle groups. Right extremity shows that he is unable to left leg at the hip. He is able to bend the knee. Some. He is able to flex and extend his ankle. There is definite weakness on the right compared to left.  Laboratory Data: Results for orders placed during the hospital encounter of 08/25/12 (from the past 48 hour(s))  CBC     Status: Abnormal   Collection Time    08/25/12  9:08 PM      Result Value Range   WBC 22.7 (*) 4.0 - 10.5 K/uL   RBC 3.37 (*) 4.22 - 5.81  MIL/uL   Hemoglobin 10.3 (*) 13.0 - 17.0 g/dL   HCT 16.1 (*) 09.6 - 04.5 %   MCV 87.5  78.0 - 100.0 fL   MCH 30.6  26.0 - 34.0 pg   MCHC 34.9  30.0 - 36.0 g/dL   RDW 40.9  81.1 - 91.4 %   Platelets 207  150 - 400 K/uL  BASIC METABOLIC PANEL     Status: Abnormal   Collection Time    08/25/12  9:08 PM      Result Value Range   Sodium 131 (*) 135 - 145 mEq/L   Potassium 2.6 (*) 3.5 - 5.1 mEq/L   Comment: CRITICAL RESULT CALLED TO, READ BACK BY AND VERIFIED WITH:     M.CARVAJAL RN 2211 08/25/12 E.GADDY   Chloride 85 (*) 96 - 112 mEq/L   CO2 33 (*) 19 - 32 mEq/L   Glucose, Bld 248 (*) 70 - 99 mg/dL   BUN 69 (*) 6 - 23 mg/dL   Creatinine, Ser 7.82 (*) 0.50 - 1.35 mg/dL   Calcium 8.9  8.4 - 95.6 mg/dL   GFR calc non Af Amer 20 (*) >90 mL/min   GFR calc Af Amer 23 (*) >90 mL/min   Comment:            The eGFR has been calculated     using the CKD EPI equation.     This calculation has not  been     validated in all clinical     situations.     eGFR's persistently     <90 mL/min signify     possible Chronic Kidney Disease.  PROTIME-INR     Status: Abnormal   Collection Time    08/25/12  9:08 PM      Result Value Range   Prothrombin Time 58.6 (*) 11.6 - 15.2 seconds   INR 7.19 (*) 0.00 - 1.49   Comment: CRITICAL RESULT CALLED TO, READ BACK BY AND VERIFIED WITH:     Wilburn Cornelia [id number] [facility]  PREPARE FRESH FROZEN PLASMA     Status: None   Collection Time    08/26/12  4:24 AM      Result Value Range   Unit Number [id number]     Blood Component Type THAWED PLASMA     Unit division 00     Status of Unit ISSUED     Transfusion Status OK TO TRANSFUSE     Unit Number [id number]     Blood Component Type THAWED PLASMA     Unit division 00     Status of Unit ISSUED     Transfusion Status OK TO TRANSFUSE     Unit Number [id number]     Blood Component Type THAWED PLASMA     Unit division 00     Status of Unit ALLOCATED     Transfusion Status OK TO TRANSFUSE     Unit Number [id number]     Blood Component Type THAWED PLASMA     Unit division 00     Status of Unit ALLOCATED     Transfusion Status OK TO TRANSFUSE    TYPE AND SCREEN     Status: None   Collection Time    08/26/12  5:55 AM      Result Value Range   ABO/RH(D) A POS     Antibody Screen NEG     Sample Expiration 08/29/2012    URINALYSIS, ROUTINE W REFLEX MICROSCOPIC     Status:  Abnormal   Collection Time    08/26/12  7:32 AM      Result Value Range   Color, Urine YELLOW  YELLOW   APPearance CLOUDY (*) CLEAR   Specific Gravity, Urine 1.014  1.005 - 1.030   pH 5.0  5.0 - 8.0   Glucose, UA NEGATIVE  NEGATIVE mg/dL   Hgb urine dipstick LARGE (*) NEGATIVE   Bilirubin Urine NEGATIVE  NEGATIVE   Ketones, ur NEGATIVE  NEGATIVE mg/dL   Protein, ur NEGATIVE  NEGATIVE mg/dL   Urobilinogen, UA 0.2  0.0 - 1.0 mg/dL   Nitrite NEGATIVE  NEGATIVE   Leukocytes, UA TRACE (*) NEGATIVE  URINE  MICROSCOPIC-ADD ON     Status: Abnormal   Collection Time    08/26/12  7:32 AM      Result Value Range   Squamous Epithelial / LPF RARE  RARE   WBC, UA 7-10  <3 WBC/hpf   RBC / HPF 7-10  <3 RBC/hpf   Bacteria, UA MANY (*) RARE   Casts GRANULAR CAST (*) NEGATIVE  SEDIMENTATION RATE     Status: Abnormal   Collection Time    08/26/12  7:51 AM      Result Value Range   Sed Rate 125 (*) 0 - 16 mm/hr  C-REACTIVE PROTEIN     Status: Abnormal   Collection Time    08/26/12  7:51 AM      Result Value Range   CRP 32.1 (*) <0.60 mg/dL  CBC     Status: Abnormal   Collection Time    08/26/12  7:51 AM      Result Value Range   WBC 23.7 (*) 4.0 - 10.5 K/uL   RBC 3.41 (*) 4.22 - 5.81 MIL/uL   Hemoglobin 10.3 (*) 13.0 - 17.0 g/dL   HCT 16.1 (*) 09.6 - 04.5 %   MCV 87.1  78.0 - 100.0 fL   MCH 30.2  26.0 - 34.0 pg   MCHC 34.7  30.0 - 36.0 g/dL   RDW 40.9 (*) 81.1 - 91.4 %   Platelets 226  150 - 400 K/uL  COMPREHENSIVE METABOLIC PANEL     Status: Abnormal   Collection Time    08/26/12 11:20 AM      Result Value Range   Sodium 134 (*) 135 - 145 mEq/L   Potassium 2.7 (*) 3.5 - 5.1 mEq/L   Comment: CRITICAL RESULT CALLED TO, READ BACK BY AND VERIFIED WITH:     C WITHROW,RN 1240 08/26/12 D BRADLEY   Chloride 91 (*) 96 - 112 mEq/L   CO2 33 (*) 19 - 32 mEq/L   Glucose, Bld 139 (*) 70 - 99 mg/dL   BUN 71 (*) 6 - 23 mg/dL   Creatinine, Ser 7.82 (*) 0.50 - 1.35 mg/dL   Calcium 8.6  8.4 - 95.6 mg/dL   Total Protein 6.3  6.0 - 8.3 g/dL   Albumin 1.8 (*) 3.5 - 5.2 g/dL   AST 213 (*) 0 - 37 U/L   ALT 78 (*) 0 - 53 U/L   Alkaline Phosphatase 89  39 - 117 U/L   Total Bilirubin 0.7  0.3 - 1.2 mg/dL   GFR calc non Af Amer 23 (*) >90 mL/min   GFR calc Af Amer 26 (*) >90 mL/min   Comment:            The eGFR has been calculated     using the CKD EPI equation.     This  calculation has not been     validated in all clinical     situations.     eGFR's persistently     <90 mL/min signify     possible  Chronic Kidney Disease.  MAGNESIUM     Status: Abnormal   Collection Time    08/26/12 11:20 AM      Result Value Range   Magnesium 2.7 (*) 1.5 - 2.5 mg/dL  PROTIME-INR     Status: Abnormal   Collection Time    08/26/12 11:20 AM      Result Value Range   Prothrombin Time 50.7 (*) 11.6 - 15.2 seconds   INR 5.95 (*) 0.00 - 1.49   Comment: CRITICAL RESULT CALLED TO, READ BACK BY AND VERIFIED WITH:     CONIN WITHROWE,RN AT 1230 08/26/12 BY ZPERRY.    Imaging Studies: Dg Chest Port 1 View  08/26/2012   *RADIOLOGY REPORT*  Clinical Data: Elevated white blood count  PORTABLE CHEST - 1 VIEW  Comparison: November 11, 2010.  Findings: Stable cardiomediastinal silhouette.  Left-sided pacemaker is unchanged in position.  No acute pulmonary disease is noted.  No pneumothorax or pleural effusion is noted.  Degenerative joint disease is seen involving both shoulders.  IMPRESSION: No acute cardiopulmonary abnormality seen.   Original Report Authenticated By: Lupita Raider.,  M.D.    Impression/Recommendations  Active Problems:   HTN (hypertension)   CKD (chronic kidney disease), stage III   Atrial fibrillation   ARF (acute renal failure)   Hypokalemia   Leukocytosis, unspecified   UTI (urinary tract infection)   Supratherapeutic INR   DM type 2 (diabetes mellitus, type 2)   #1 right lower extremity weakness with back pain: MRI was ordered by Dr. Yetta Barre. Defer further management to Dr. Yetta Barre. I agree that we need to rule out infection and possible bleeding.  #2 acute renal failure with dehydration and hypokalemia: He'll be given IV fluids. Potassium will be repleted intravenously and orally. He is likely dehydrated due to poor oral intake in the last week or so.His magnesium level is normal. Urine output will be monitored closely.  #3 transaminitis: Again, etiology is unclear. This is new. He does have some right-sided tenderness. However, it was present in the entire right side, not specifically in  the right upper quadrant. We will get an ultrasound of the abdomen. Recheck his liver functions tomorrow.  #4 supratherapeutic INR: His daughter tells me that his INR has always been very difficult to regulate. His last INR check was on July 17, which was in the therapeutic range. He has been given vitamin K. He's been given FFP's. Will give another dose of Vit K as plan is for CT myelogram once INR is low.  #5 urinary tract infection: Continue with ceftriaxone. Await urine cultures.  #6 leukocytosis: Most likely due to UTI. However, need to rule out other infectious sources. Chest x-ray was unremarkable. Blood cultures have been ordered. Await further evaluation of the back as per Dr. Yetta Barre.  #7 right-sided abdominal tenderness: Patient did not have any pain per se, but the tenderness was elicited on examination. Is unclear if it's related to the back. We will get an abdominal series. Continue to monitor closely.  #8 history of atrial fibrillation: Continue with beta blockers withholding parameter is. Rate is reasonably well controlled. He also has a pacemaker in place. He is anticoagulated as discussed above.  #9 Normocytic anemia: Hemoglobin is stable this morning compared to last night. Continue to  monitor. There is no overt bleeding.  #10 Diabetes, type II: Hold his pioglitazone. Sliding scale insulin will be instituted. Check HbA1c.   DVT Prophylaxis:   He has elevated INR Family Communication: Discussed with the patient's daughter   We will followup again tomorrow. Thank you for this consultation.  [facility]  [facility] Pager [phone]  If 7PM-7AM, please contact night-coverage.  [email or web address] Password [facility]  08/26/2012, 4:15 PM

## 2012-08-26 NOTE — Progress Notes (Signed)
Patient ID: [name], male   DOB: [birth year], 79 y.o.   MRN: [id number] Subjective: Patient reports he's much better from a pain perspective. No change in weakness or numbness. 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 Still c/o R anterior quad pain.  Objective: Vital signs in last 24 hours: Temp:  [97.8 F (36.6 C)-99.4 F (37.4 C)] 97.8 F (36.6 C) (08/01 0540) Pulse Rate:  [71-93] 85 (08/01 0540) Resp:  [18-20] 18 (08/01 0540) BP: (95-124)/(40-65) 124/65 mmHg (08/01 0540) SpO2:  [96 %-100 %] 96 % (08/01 0540) Weight:  [110.723 kg (244 lb 1.6 oz)] 110.723 kg (244 lb 1.6 oz) (07/31 2128)  Intake/Output from previous day: 07/31 0701 - 08/01 0700 In: 3 [I.V.:3] Out: -  Intake/Output this shift:    Neurologic: Grossly normal except for her proximal right lower extremity weakness at the hip flexor and knee extensor. Dorsi and plantar flexion are at least antigravity, he has very little quadriceps strength and his right hip flexor is 1-2/5  Lab Results: Lab Results  Component Value Date   WBC 22.7* 08/25/2012   HGB 10.3* 08/25/2012   HCT 29.5* 08/25/2012   MCV 87.5 08/25/2012   PLT 207 08/25/2012   Lab Results  Component Value Date   INR 7.19* 08/25/2012   BMET Lab Results  Component Value Date   NA 131* 08/25/2012   K 2.6* 08/25/2012   CL 85* 08/25/2012   CO2 33* 08/25/2012   GLUCOSE 248* 08/25/2012   BUN 69* 08/25/2012   CREATININE 2.80* 08/25/2012   CALCIUM 8.9 08/25/2012    Studies/Results: No results found.  Assessment/Plan: He is getting FFP and vitamin K for his INR of 7.2. He is being hydrated to hopefully help his creatinine. He has stable profound right proximal leg weakness of one week's duration so I'm not sure this is an emergent situation at this point. He is much more comfortable. Once his INR is corrected we will order a CT myelogram of the thoracic and lumbar spine to evaluate because of his weakness. This could be related to spinal stenosis or thoracic /lumbar  disc herniation. Given his high white count it could be infectious. Given his high INR it could be hemorrhagic (he has had previous surgery and therefore there certainly a risk of AVF formation). I will ask the hospitalist to consult on him to help with his medical management.   LOS: 1 day    Nikkia Devoss S 08/26/2012, 7:46 AM

## 2012-08-26 NOTE — Progress Notes (Signed)
CRITICAL VALUE ALERT  Critical value received:  Potassium 2.3  Date of notification: 08/26/12  Time of notification:  2230  Critical value read back:yes  Nurse who received alert:  Kelsey [place], RN  MD notified (1st page):  Elray Mcgregor, NP  Time of first page: [phone]  MD notified (2nd page):  Time of second page:  Responding MD:   Elray Mcgregor, NP  Time MD responded:  [phone]

## 2012-08-27 ENCOUNTER — Inpatient Hospital Stay (HOSPITAL_COMMUNITY): Payer: Medicare Other

## 2012-08-27 DIAGNOSIS — Z5189 Encounter for other specified aftercare: Secondary | ICD-10-CM

## 2012-08-27 DIAGNOSIS — N390 Urinary tract infection, site not specified: Secondary | ICD-10-CM

## 2012-08-27 DIAGNOSIS — R791 Abnormal coagulation profile: Secondary | ICD-10-CM

## 2012-08-27 DIAGNOSIS — R29898 Other symptoms and signs involving the musculoskeletal system: Secondary | ICD-10-CM

## 2012-08-27 DIAGNOSIS — B9689 Other specified bacterial agents as the cause of diseases classified elsewhere: Secondary | ICD-10-CM

## 2012-08-27 DIAGNOSIS — R7881 Bacteremia: Secondary | ICD-10-CM

## 2012-08-27 DIAGNOSIS — I4891 Unspecified atrial fibrillation: Secondary | ICD-10-CM

## 2012-08-27 DIAGNOSIS — N179 Acute kidney failure, unspecified: Secondary | ICD-10-CM

## 2012-08-27 DIAGNOSIS — A4901 Methicillin susceptible Staphylococcus aureus infection, unspecified site: Secondary | ICD-10-CM

## 2012-08-27 DIAGNOSIS — D72829 Elevated white blood cell count, unspecified: Secondary | ICD-10-CM

## 2012-08-27 DIAGNOSIS — R188 Other ascites: Secondary | ICD-10-CM

## 2012-08-27 DIAGNOSIS — E876 Hypokalemia: Secondary | ICD-10-CM

## 2012-08-27 LAB — BASIC METABOLIC PANEL
CO2: 35 mEq/L — ABNORMAL HIGH (ref 19–32)
Chloride: 95 mEq/L — ABNORMAL LOW (ref 96–112)
Glucose, Bld: 152 mg/dL — ABNORMAL HIGH (ref 70–99)
Potassium: 3.7 mEq/L (ref 3.5–5.1)
Sodium: 138 mEq/L (ref 135–145)

## 2012-08-27 LAB — GLUCOSE, CAPILLARY: Glucose-Capillary: 157 mg/dL — ABNORMAL HIGH (ref 70–99)

## 2012-08-27 LAB — COMPREHENSIVE METABOLIC PANEL
Albumin: 2.3 g/dL — ABNORMAL LOW (ref 3.5–5.2)
Alkaline Phosphatase: 97 U/L (ref 39–117)
BUN: 59 mg/dL — ABNORMAL HIGH (ref 6–23)
Potassium: 2.6 mEq/L — CL (ref 3.5–5.1)
Total Protein: 6.4 g/dL (ref 6.0–8.3)

## 2012-08-27 LAB — PREPARE FRESH FROZEN PLASMA
Unit division: 0
Unit division: 0
Unit division: 0

## 2012-08-27 LAB — PROTIME-INR
INR: 1.38 (ref 0.00–1.49)
Prothrombin Time: 16.6 seconds — ABNORMAL HIGH (ref 11.6–15.2)

## 2012-08-27 LAB — HEMOGLOBIN A1C
Hgb A1c MFr Bld: 6.7 % — ABNORMAL HIGH (ref <5.7)
Mean Plasma Glucose: 146 mg/dL — ABNORMAL HIGH (ref <117)

## 2012-08-27 LAB — CBC WITH DIFFERENTIAL/PLATELET
Basophils Absolute: 0 10*3/uL (ref 0.0–0.1)
Lymphocytes Relative: 4 % — ABNORMAL LOW (ref 12–46)
Lymphs Abs: 0.7 10*3/uL (ref 0.7–4.0)
MCV: 88.3 fL (ref 78.0–100.0)
Monocytes Relative: 6 % (ref 3–12)
Platelets: 218 10*3/uL (ref 150–400)
RDW: 15.7 % — ABNORMAL HIGH (ref 11.5–15.5)
WBC: 17 10*3/uL — ABNORMAL HIGH (ref 4.0–10.5)

## 2012-08-27 MED ORDER — IOHEXOL 300 MG/ML  SOLN
20.0000 mL | INTRAMUSCULAR | Status: AC
  Administered 2012-08-27: 50 mL via ORAL

## 2012-08-27 MED ORDER — SODIUM CHLORIDE 0.9 % IV SOLN
INTRAVENOUS | Status: DC
  Administered 2012-08-27 – 2012-08-29 (×2): via INTRAVENOUS
  Filled 2012-08-27 (×6): qty 1000

## 2012-08-27 MED ORDER — POTASSIUM CHLORIDE CRYS ER 20 MEQ PO TBCR
40.0000 meq | EXTENDED_RELEASE_TABLET | ORAL | Status: AC
  Administered 2012-08-27 (×2): 40 meq via ORAL
  Filled 2012-08-27 (×2): qty 2

## 2012-08-27 MED ORDER — ENOXAPARIN SODIUM 120 MG/0.8ML ~~LOC~~ SOLN
110.0000 mg | Freq: Two times a day (BID) | SUBCUTANEOUS | Status: DC
  Administered 2012-08-27 – 2012-08-28 (×2): 110 mg via SUBCUTANEOUS
  Filled 2012-08-27 (×4): qty 0.8

## 2012-08-27 MED ORDER — SODIUM CHLORIDE 0.9 % IV SOLN: INTRAVENOUS | Status: DC

## 2012-08-27 MED ORDER — VANCOMYCIN HCL 10 G IV SOLR
2000.0000 mg | Freq: Once | INTRAVENOUS | Status: AC
  Administered 2012-08-27: 2000 mg via INTRAVENOUS
  Filled 2012-08-27: qty 2000

## 2012-08-27 MED ORDER — SODIUM CHLORIDE 0.9 % IV SOLN
INTRAVENOUS | Status: DC
  Administered 2012-08-27: 19:00:00 via INTRAVENOUS

## 2012-08-27 MED ORDER — VANCOMYCIN HCL 10 G IV SOLR
1500.0000 mg | INTRAVENOUS | Status: DC
  Administered 2012-08-28 – 2012-08-29 (×2): 1500 mg via INTRAVENOUS
  Filled 2012-08-27 (×3): qty 1500

## 2012-08-27 NOTE — Progress Notes (Addendum)
CRITICAL VALUE ALERT  Critical value received:  K+ 2.6  Date of notification:  08/27/12  Time of notification:  0824  Critical value read back:yes  Nurse who received alert:  Kanisha Duba [place], RN  MD notified (1st page): Elray Mcgregor, NP  Time of first page:  [phone]  MD notified (2nd page):  Time of second page:  Responding MD:  Elray Mcgregor, NP  Time MD responded:  [phone]

## 2012-08-27 NOTE — Progress Notes (Signed)
ANTICOAGULATION CONSULT NOTE - Initial Consult  Pharmacy Consult for Lovenox Indication: atrial fibrillation  No Known Allergies  Labs:  Recent Labs  08/25/12 2108 08/26/12 0751 08/26/12 1120 08/26/12 2118 08/27/12 0530  HGB 10.3* 10.3*  --   --  9.9*  HCT 29.5* 29.7*  --   --  28.7*  PLT 207 226  --   --  218  LABPROT 58.6*  --  50.7*  --  16.6*  INR 7.19*  --  5.95*  --  1.38  CREATININE 2.80*  --  2.51* 2.07* 1.95*    Estimated Creatinine Clearance: 38.5 ml/min (by C-G formula based on Cr of 1.95).   Medical History: Past Medical History  Diagnosis Date  . Atrial fibrillation   . Hypertension   . Pacemaker   . Diabetes mellitus without complication     Borderline  . Arthritis   . Anxiety   . Depression     Assessment: 79 year old male on Coumadin PTA, admitted with elevated INR, now starting Lovenox as bridge  Goal of Therapy:  Anti-Xa level 0.6-1.2 units/ml 4hrs after LMWH dose given Monitor platelets by anticoagulation protocol: Yes   Plan:  1) Lovenox 110 mg sq Q 12 hours 2) Continue to follow  Thank you. Okey Regal, PharmD [phone]  08/27/2012,2:39 PM

## 2012-08-27 NOTE — Progress Notes (Signed)
ANTIBIOTIC CONSULT NOTE - INITIAL  Pharmacy Consult for vancomycin Indication: positive blood culture  No Known Allergies  Patient Measurements: Height: 5' 11.5" (181.6 cm) Weight: 244 lb 1.6 oz (110.723 kg) IBW/kg (Calculated) : 76.45  Vital Signs: Temp: 100.1 F (37.8 C) (08/02 0137) Temp src: Oral (08/02 0137) BP: 129/56 mmHg (08/02 0137) Pulse Rate: 77 (08/02 0137) Intake/Output from previous day: 08/01 0701 - 08/02 0700 In: 1654 [Blood:1654] Out: 100 [Urine:100] Intake/Output from this shift: Total I/O In: 650 [Blood:650] Out: 100 [Urine:100]  Labs:  Recent Labs  08/25/12 2108 08/26/12 0751 08/26/12 1120 08/26/12 2118  WBC 22.7* 23.7*  --   --   HGB 10.3* 10.3*  --   --   PLT 207 226  --   --   CREATININE 2.80*  --  2.51* 2.07*   Estimated Creatinine Clearance: 36.3 ml/min (by C-G formula based on Cr of 2.07).    Microbiology: Recent Results (from the past 720 hour(s))  CULTURE, BLOOD (ROUTINE X 2)     Status: None   Collection Time    08/26/12 11:20 AM      Result Value Range Status   Specimen Description BLOOD LEFT ARM   Final   Special Requests BOTTLES DRAWN AEROBIC AND ANAEROBIC 10CC   Final   Culture  Setup Time 08/26/2012 16:54   Final   Culture     Final   Value: GRAM POSITIVE COCCI IN CLUSTERS     2 Note: Gram Stain Report Called to,Read Back By and Verified With: KELSEY [place] RN ON8 14 @420  EDMOJ   Report Status PENDING   Incomplete  CULTURE, BLOOD (ROUTINE X 2)     Status: None   Collection Time    08/26/12 11:30 AM      Result Value Range Status   Specimen Description BLOOD LEFT HAND   Final   Special Requests     Final   Value: BOTTLES DRAWN AEROBIC AND ANAEROBIC AERO 10CC ANA 5CC   Culture  Setup Time 08/26/2012 16:54   Final   Culture     Final   Value: GRAM POSITIVE COCCI IN CLUSTERS     2 Note: Gram Stain Report Called to,Read Back By and Verified With: KELSEY [place] RN ON8 14 @420  EDMOJ   Report Status PENDING   Incomplete     Medical History: Past Medical History  Diagnosis Date  . Atrial fibrillation   . Hypertension   . Pacemaker   . Diabetes mellitus without complication     Borderline  . Arthritis   . Anxiety   . Depression     Medications:  Prescriptions prior to admission  Medication Sig Dispense Refill  . ALPRAZolam (XANAX) 0.5 MG tablet Take 1 tablet (0.5 mg total) by mouth daily as needed. For anxiety  5 tablet  0  . diphenhydramine-acetaminophen (TYLENOL PM) 25-500 MG TABS Take 1 tablet by mouth at bedtime as needed.      . finasteride (PROSCAR) 5 MG tablet Take 5 mg by mouth daily.        . furosemide (LASIX) 40 MG tablet Take 40 mg by mouth daily.        Marland Kitchen HYDROcodone-acetaminophen (VICODIN) 5-500 MG per tablet Take 1 tablet by mouth every 4 (four) hours as needed for pain.  5 tablet  0  . levothyroxine (SYNTHROID, LEVOTHROID) 75 MCG tablet Take 75 mcg by mouth daily.        Marland Kitchen lovastatin (MEVACOR) 20 MG tablet Take 40  mg by mouth at bedtime.        . metolazone (ZAROXOLYN) 2.5 MG tablet Take 2.5 mg by mouth every Monday, Wednesday, and Friday. As needed for fluid retention.      . metoprolol succinate (TOPROL-XL) 25 MG 24 hr tablet Take 12.5 mg by mouth daily.      . pioglitazone (ACTOS) 15 MG tablet Take 1 tablet by mouth daily.      Marland Kitchen terazosin (HYTRIN) 5 MG capsule Take 5 mg by mouth daily.        Marland Kitchen warfarin (COUMADIN) 4 MG tablet Take 1 tablet (4 mg total) by mouth daily. Hasn't taken since last Thursday due to the anticipation of the CT scan that took place today.  Usually takes daily       Scheduled:  . cefTRIAXone (ROCEPHIN)  IV  1 g Intravenous Q24H  . finasteride  5 mg Oral Daily  . insulin aspart  0-9 Units Subcutaneous TID WC  . levothyroxine  75 mcg Oral QAC breakfast  . metolazone  2.5 mg Oral Q M,W,F  . metoprolol succinate  12.5 mg Oral Daily  . [START ON 08/28/2012] vancomycin  1,500 mg Intravenous Q24H  . vancomycin  2,000 mg Intravenous Once   Infusions:  . sodium  chloride      Assessment: 79yo male c/o progressive back pain and severe RLE weakness x1wk, admitted for evaluation by neuro, awaiting INR reversal for CT myelogram, concern for infectious (WBC>20) vs hemorrhagic process (INR 7), now w/ GPC in blood culture, to begin IV ABX.  Goal of Therapy:  Vancomycin trough level 15-20 mcg/ml  Plan:  Will give vancomycin 2000mg  IV x1 now followed by 1500mg  IV Q24H and monitor CBC, Cx, levels prn.  Vernard Gambles, PharmD, BCPS  08/27/2012,4:32 AM

## 2012-08-27 NOTE — Progress Notes (Addendum)
CRITICAL VALUE ALERT  Critical value received:  Anaerobic gram + cocci clusters x2  Date of notification:  08/27/12  Time of notification:  0416  Critical value read back: yes  Nurse who received alert:  Atalie Oros [place], RN  MD notified (1st page): Elray Mcgregor, NP  Time of first page:  [phone]  MD notified (2nd page):  Time of second page:  Responding MD:  Elray Mcgregor, NP  Time MD responded:  [phone]

## 2012-08-27 NOTE — Progress Notes (Signed)
Patient ID: [name], male   DOB: 03/10/1930, 79 y.o.   MRN: [id number] Subjective:  The patient is alert and pleasant.  Objective: Vital signs in last 24 hours: Temp:  [97.5 F (36.4 C)-100.4 F (38 C)] 98.6 F (37 C) (08/02 1009) Pulse Rate:  [64-96] 96 (08/02 1009) Resp:  [16-20] 20 (08/02 1009) BP: (98-137)/(47-67) 103/52 mmHg (08/02 1009) SpO2:  [92 %-96 %] 92 % (08/02 1009)  Intake/Output from previous day: 08/01 0701 - 08/02 0700 In: 1654 [Blood:1654] Out: 100 [Urine:100] Intake/Output this shift:    Physical exam the patient is alert and oriented. His strength is normal except his right psoas and quadriceps strength is 1/5  Lab Results:  Recent Labs  08/26/12 0751 08/27/12 0530  WBC 23.7* 17.0*  HGB 10.3* 9.9*  HCT 29.7* 28.7*  PLT 226 218   BMET  Recent Labs  08/26/12 2118 08/27/12 0530  NA 136 137  K 2.3* 2.6*  CL 90* 91*  CO2 34* 34*  GLUCOSE 146* 153*  BUN 64* 59*  CREATININE 2.07* 1.95*  CALCIUM 9.0 8.9    Studies/Results: US Abdomen Complete  08/26/2012   *RADIOLOGY REPORT*  Clinical Data:  79 year old with abnormal liver function tests.  COMPLETE ABDOMINAL ULTRASOUND  Comparison:  Unenhanced CT abdomen and pelvis 1Nov 10, 202013 [place].  Abdominal ultrasound 05/21/2011 [facility].  Findings:  Gallbladder:  Small shadowing gallstones.  Patient unable to turn into the lateral decubitus position to confirm movement of the stones, but these were present on the prior ultrasound examination. No gallbladder wall thickening or pericholecystic fluid.  Negative sonographic Murphy's sign according to the ultrasound technologist.  Common bile duct:  Normal in caliber with maximum diameter approximating 3 mm.  Liver:  Diffusely increased and coarsened echotexture without focal parenchymal abnormality.  Patent portal vein with hepatopetal flow.  IVC:  Patent in its intrahepatic portion.  Obscured outside the liver by bowel gas.  Pancreas:  Obscured by  midline bowel gas and therefore not evaluated.  Spleen:  Normal size and echotexture without focal parenchymal abnormality.  Right Kidney:  No hydronephrosis.  Well-preserved cortex for age. Cysts arising from the mid and lower pole as noted previously, the cyst arising from the mid kidney approximating 2.8 cm and the cyst arising from the lower pole approximating 3.1 cm.  No solid renal masses.  Normal parenchymal echotexture.  Approximately 12.0 cm in length.  Left Kidney:  No hydronephrosis.  Well-preserved cortex for age. Normal parenchymal echotexture.  No focal parenchymal abnormality. Approximately 10.7 cm in length.  Abdominal aorta:  Obscured by midline bowel gas and therefore not evaluated.  IMPRESSION:  1.  Cholelithiasis.  No sonographic evidence of acute cholecystitis.  No biliary ductal dilation. 2.  Diffuse hepatic steatosis without focal hepatic parenchymal abnormality. 3.  Midline structures (pancreas, extrahepatic IVC, and abdominal aorta) obscured by overlying bowel gas and therefore not evaluated.   Original Report Authenticated By: Hulan Saas, M.D.   Dg Chest Port 1 View  08/26/2012   *RADIOLOGY REPORT*  Clinical Data: Elevated white blood count  PORTABLE CHEST - 1 VIEW  Comparison: November 11, 2010.  Findings: Stable cardiomediastinal silhouette.  Left-sided pacemaker is unchanged in position.  No acute pulmonary disease is noted.  No pneumothorax or pleural effusion is noted.  Degenerative joint disease is seen involving both shoulders.  IMPRESSION: No acute cardiopulmonary abnormality seen.   Original Report Authenticated By: Lupita Raider.,  M.D.   Dg Abd Portable 2v  08/26/2012   *  RADIOLOGY REPORT*  Clinical Data: Abdominal pain.  PORTABLE ABDOMEN - 2 VIEW  Comparison: [facility] images for CT scan of the lumbar spine dated 02/10/2012  Findings: There is no free air in the abdomen.  There is air scattered throughout nondistended loops of large and small bowel. No excessive stool in the  colon.  Diffuse degenerative changes of the lumbar spine.  Right hip prosthesis.  IMPRESSION: No acute abnormalities.   Original Report Authenticated By: Francene Boyers, M.D.    Assessment/Plan: Right leg weakness: The plan is to work him up with a lumbar mild CT since he can't get an MRI given his pacemaker. This may need to wait until Monday until his medical issues are optimized  Chronic renal insufficiency, hypokalemia, Bactrim anemia, possible urine tract infection: I appreciate Dr. Carollee Massed management of these issues.  LOS: 2 days     Arliss Frisina D 08/27/2012, 10:24 AM

## 2012-08-27 NOTE — Progress Notes (Signed)
[facility] PROGRESS NOTE  [name] ZOX:[id number] DOB: Apr 18, 1930 DOA: 08/25/2012 PCP: Malka So., MD  Assessment/Plan: #1 bacteremia Questionable etiology. Preliminary blood cultures were 2 out of 2 gram-positive cocci in clusters. CT of the T. and L-spine pending. Continue empiric IV vancomycin and Rocephin. We'll consult with ID for further evaluation and management.  #2 probable UTI Urine cultures are pending. Continue IV Rocephin.  #3 right lower extremity weakness and back pain Per neurosurgery.  #4 acute renal failure/dehydration/hypokalemia Acute renal failure secondary to prerenal azotemia. Check a urine sodium and a urine creatinine. Renal function improved with hydration. Follow. Replete potassium.  #5 supratherapeutic INR Status post vitamin K doses x2. Status post FFP. INR is now subtherapeutic. Follow.  #6 leukocytosis Likely secondary to problems #1 and 2. Urine cultures are pending. Blood cultures are still pending however preliminary with gram-positive cocci in clusters. Continue empiric IV vancomycin and Rocephin. ID consultation pending.  #7 right-sided abdominal tenderness Clinical improvement. Abdominal ultrasound and abdominal films negative for any acute abnormalities. Abdominal ultrasound positive for hepatic steatosis. Follow.  #8 atrial fibrillation Continue beta blocker for rate control. INR has been reversed. Patient is status post permanent pacemaker. We'll place on full dose Lovenox on anticoagulations. Will resume Coumadin when okay with primary team.  #9 normocytic anemia Stable. Follow H&H.  #10 type 2 diabetes Continue sliding scale insulin.  Code Status: Full Family Communication: Updated patient and daughter at bedside. Disposition Plan: Per primary team.   Consultants:    Procedures:  Acute abdominal series 08/26/2012  Chest x-ray 08/26/2012  Antibiotics:  IV vancomycin 08/26/2012  IV Rocephin  08/26/2012  HPI/Subjective: Patient states right lower extremity weakness and numbness unchanged. No complaints.  Objective: Filed Vitals:   08/27/12 0137 08/27/12 0542 08/27/12 1009 08/27/12 1414  BP: 129/56 122/61 103/52 100/55  Pulse: 77 88 96 88  Temp: 100.1 F (37.8 C) 100.4 F (38 C) 98.6 F (37 C) 97.4 F (36.3 C)  TempSrc: Oral Oral Oral Oral  Resp: 20 18 20 18   Height:      Weight:      SpO2: 92% 96% 92% 94%    Intake/Output Summary (Last 24 hours) at 08/27/12 1434 Last data filed at 08/27/12 0000  Gross per 24 hour  Intake    976 ml  Output    100 ml  Net    876 ml   Filed Weights   08/25/12 2128  Weight: 110.723 kg (244 lb 1.6 oz)    Exam:   General:  NAD  Cardiovascular: RRR  Respiratory: CTAB  Abdomen: SOFT/NT/ND/+BS  EXTREMITIES: No c/c/e   Data Reviewed: Basic Metabolic Panel:  Recent Labs Lab 08/25/12 2108 08/26/12 1120 08/26/12 2118 08/27/12 0530  NA 131* 134* 136 137  K 2.6* 2.7* 2.3* 2.6*  CL 85* 91* 90* 91*  CO2 33* 33* 34* 34*  GLUCOSE 248* 139* 146* 153*  BUN 69* 71* 64* 59*  CREATININE 2.80* 2.51* 2.07* 1.95*  CALCIUM 8.9 8.6 9.0 8.9  MG  --  2.7*  --   --    Liver Function Tests:  Recent Labs Lab 08/26/12 1120 08/27/12 0530  AST 192* 117*  ALT 78* 60*  ALKPHOS 89 97  BILITOT 0.7 1.2  PROT 6.3 6.4  ALBUMIN 1.8* 2.3*   No results found for this basename: LIPASE, AMYLASE,  in the last 168 hours No results found for this basename: AMMONIA,  in the last 168 hours CBC:  Recent Labs Lab 08/25/12 2108 08/26/12  1610 08/26/12 0751 08/27/12 0530  WBC 22.7*  --  23.7* 17.0*  NEUTROABS  --  20.9*  --  15.3*  HGB 10.3*  --  10.3* 9.9*  HCT 29.5*  --  29.7* 28.7*  MCV 87.5  --  87.1 88.3  PLT 207  --  226 218   Cardiac Enzymes: No results found for this basename: CKTOTAL, CKMB, CKMBINDEX, TROPONINI,  in the last 168 hours BNP (last 3 results) No results found for this basename: PROBNP,  in the last 8760  hours CBG:  Recent Labs Lab 08/26/12 1652 08/27/12 0030 08/27/12 0648 08/27/12 1142  GLUCAP 144* 157* 158* 157*    Recent Results (from the past 240 hour(s))  CULTURE, BLOOD (ROUTINE X 2)     Status: None   Collection Time    08/26/12 11:20 AM      Result Value Range Status   Specimen Description BLOOD LEFT ARM   Final   Special Requests BOTTLES DRAWN AEROBIC AND ANAEROBIC 10CC   Final   Culture  Setup Time 08/26/2012 16:54   Final   Culture     Final   Value: GRAM POSITIVE COCCI IN CLUSTERS     2 Note: Gram Stain Report Called to,Read Back By and Verified With: KELSEY [place] RN ON8 14 @420  EDMOJ   Report Status PENDING   Incomplete  CULTURE, BLOOD (ROUTINE X 2)     Status: None   Collection Time    08/26/12 11:30 AM      Result Value Range Status   Specimen Description BLOOD LEFT HAND   Final   Special Requests     Final   Value: BOTTLES DRAWN AEROBIC AND ANAEROBIC AERO 10CC ANA 5CC   Culture  Setup Time 08/26/2012 16:54   Final   Culture     Final   Value: GRAM POSITIVE COCCI IN CLUSTERS     2 Note: Gram Stain Report Called to,Read Back By and Verified With: KELSEY [place] RN ON8 14 @420  EDMOJ   Report Status PENDING   Incomplete     Studies: US Abdomen Complete  08/26/2012   *RADIOLOGY REPORT*  Clinical Data:  79 year old with abnormal liver function tests.  COMPLETE ABDOMINAL ULTRASOUND  Comparison:  Unenhanced CT abdomen and pelvis 12020/05/511 .  Abdominal ultrasound 05/21/2011 [facility].  Findings:  Gallbladder:  Small shadowing gallstones.  Patient unable to turn into the lateral decubitus position to confirm movement of the stones, but these were present on the prior ultrasound examination. No gallbladder wall thickening or pericholecystic fluid.  Negative sonographic Murphy's sign according to the ultrasound technologist.  Common bile duct:  Normal in caliber with maximum diameter approximating 3 mm.  Liver:  Diffusely increased and coarsened  echotexture without focal parenchymal abnormality.  Patent portal vein with hepatopetal flow.  IVC:  Patent in its intrahepatic portion.  Obscured outside the liver by bowel gas.  Pancreas:  Obscured by midline bowel gas and therefore not evaluated.  Spleen:  Normal size and echotexture without focal parenchymal abnormality.  Right Kidney:  No hydronephrosis.  Well-preserved cortex for age. Cysts arising from the mid and lower pole as noted previously, the cyst arising from the mid kidney approximating 2.8 cm and the cyst arising from the lower pole approximating 3.1 cm.  No solid renal masses.  Normal parenchymal echotexture.  Approximately 12.0 cm in length.  Left Kidney:  No hydronephrosis.  Well-preserved cortex for age. Normal parenchymal echotexture.  No focal parenchymal abnormality. Approximately  10.7 cm in length.  Abdominal aorta:  Obscured by midline bowel gas and therefore not evaluated.  IMPRESSION:  1.  Cholelithiasis.  No sonographic evidence of acute cholecystitis.  No biliary ductal dilation. 2.  Diffuse hepatic steatosis without focal hepatic parenchymal abnormality. 3.  Midline structures (pancreas, extrahepatic IVC, and abdominal aorta) obscured by overlying bowel gas and therefore not evaluated.   Original Report Authenticated By: Hulan Saas, M.D.   Dg Chest Port 1 View  08/26/2012   *RADIOLOGY REPORT*  Clinical Data: Elevated white blood count  PORTABLE CHEST - 1 VIEW  Comparison: November 11, 2010.  Findings: Stable cardiomediastinal silhouette.  Left-sided pacemaker is unchanged in position.  No acute pulmonary disease is noted.  No pneumothorax or pleural effusion is noted.  Degenerative joint disease is seen involving both shoulders.  IMPRESSION: No acute cardiopulmonary abnormality seen.   Original Report Authenticated By: Lupita Raider.,  M.D.   Dg Abd Portable 2v  08/26/2012   *RADIOLOGY REPORT*  Clinical Data: Abdominal pain.  PORTABLE ABDOMEN - 2 VIEW  Comparison: [facility] images  for CT scan of the lumbar spine dated 02/10/2012  Findings: There is no free air in the abdomen.  There is air scattered throughout nondistended loops of large and small bowel. No excessive stool in the colon.  Diffuse degenerative changes of the lumbar spine.  Right hip prosthesis.  IMPRESSION: No acute abnormalities.   Original Report Authenticated By: Francene Boyers, M.D.    Scheduled Meds: . cefTRIAXone (ROCEPHIN)  IV  1 g Intravenous Q24H  . finasteride  5 mg Oral Daily  . insulin aspart  0-9 Units Subcutaneous TID WC  . levothyroxine  75 mcg Oral QAC breakfast  . metolazone  2.5 mg Oral Q M,W,F  . metoprolol succinate  12.5 mg Oral Daily  . [START ON 08/28/2012] vancomycin  1,500 mg Intravenous Q24H   Continuous Infusions: . sodium chloride 0.9 % 1,000 mL with potassium chloride 40 mEq infusion 75 mL/hr at 08/27/12 1000    Active Problems:   HTN (hypertension)   CKD (chronic kidney disease), stage III   Atrial fibrillation   ARF (acute renal failure)   Hypokalemia   Leukocytosis, unspecified   UTI (urinary tract infection)   Supratherapeutic INR   DM type 2 (diabetes mellitus, type 2)   Bacteremia    Time spent: > 35 mins    [facility]  [facility] Pager [phone]. If 7PM-7AM, please contact night-coverage at [email or web address], password [facility] 08/27/2012, 2:34 PM  LOS: 2 days

## 2012-08-27 NOTE — Progress Notes (Signed)
ANTIBIOTIC CONSULT NOTE - INITIAL  Pharmacy Consult for cefazolin Indication: GPC Bacteremia  No Known Allergies  Patient Measurements: Height: 5' 11.5" (181.6 cm) Weight: 244 lb 1.6 oz (110.723 kg) IBW/kg (Calculated) : 76.45   Vital Signs: Temp: 97.4 F (36.3 C) (08/02 1414) Temp src: Oral (08/02 1414) BP: 100/55 mmHg (08/02 1414) Pulse Rate: 88 (08/02 1414) Intake/Output from previous day: 08/01 0701 - 08/02 0700 In: 1654 [Blood:1654] Out: 100 [Urine:100] Intake/Output from this shift:    Labs:  Recent Labs  08/25/12 2108 08/26/12 0751  08/26/12 2118 08/27/12 0530 08/27/12 1435  WBC 22.7* 23.7*  --   --  17.0*  --   HGB 10.3* 10.3*  --   --  9.9*  --   PLT 207 226  --   --  218  --   CREATININE 2.80*  --   < > 2.07* 1.95* 1.78*  < > = values in this interval not displayed. Estimated Creatinine Clearance: 42.2 ml/min (by C-G formula based on Cr of 1.78).   Microbiology: Recent Results (from the past 720 hour(s))  CULTURE, BLOOD (ROUTINE X 2)     Status: None   Collection Time    08/26/12 11:20 AM      Result Value Range Status   Specimen Description BLOOD LEFT ARM   Final   Special Requests BOTTLES DRAWN AEROBIC AND ANAEROBIC 10CC   Final   Culture  Setup Time 08/26/2012 16:54   Final   Culture     Final   Value: GRAM POSITIVE COCCI IN CLUSTERS     2 Note: Gram Stain Report Called to,Read Back By and Verified With: KELSEY [place] RN ON8 14 @420  EDMOJ   Report Status PENDING   Incomplete  CULTURE, BLOOD (ROUTINE X 2)     Status: None   Collection Time    08/26/12 11:30 AM      Result Value Range Status   Specimen Description BLOOD LEFT HAND   Final   Special Requests     Final   Value: BOTTLES DRAWN AEROBIC AND ANAEROBIC AERO 10CC ANA 5CC   Culture  Setup Time 08/26/2012 16:54   Final   Culture     Final   Value: GRAM POSITIVE COCCI IN CLUSTERS     2 Note: Gram Stain Report Called to,Read Back By and Verified With: KELSEY [place] RN ON8 14 @420  EDMOJ    Report Status PENDING   Incomplete    Assessment: 82 YOM with increasing back pain and weakness, now with positive blood cultures. Is already on vancomycin and ID is adding cefazolin for Staph coverage. SCr 1.78, CrCl ~52mL/min. 2/2 GPC in clusters on 8/1 cultures.  Goal of Therapy:  eradication of infection  Plan:  1. Start cefazolin 1g IV Q8h 2. Follow cultures/sensitivities, LOT and clinical progression  Cataleia Gade D. Danial Sisley, PharmD Clinical Pharmacist Pager: [phone] 08/27/2012 4:21 PM

## 2012-08-27 NOTE — Consult Note (Signed)
[facility]     Reason for Consult: GPC bacteremia    Referring Physician: Dr. Janee Morn  Active Problems:   HTN (hypertension)   CKD (chronic kidney disease), stage III   Atrial fibrillation   ARF (acute renal failure)   Hypokalemia   Leukocytosis, unspecified   UTI (urinary tract infection)   Supratherapeutic INR   DM type 2 (diabetes mellitus, type 2)   Bacteremia   . cefTRIAXone (ROCEPHIN)  IV  1 g Intravenous Q24H  . enoxaparin (LOVENOX) injection  110 mg Subcutaneous Q12H  . finasteride  5 mg Oral Daily  . insulin aspart  0-9 Units Subcutaneous TID WC  . levothyroxine  75 mcg Oral QAC breakfast  . metolazone  2.5 mg Oral Q M,W,F  . metoprolol succinate  12.5 mg Oral Daily  . [START ON 08/28/2012] vancomycin  1,500 mg Intravenous Q24H    Recommendations: Continue with vancomycin and I will change ceftriaxone to cefazolin for better Staph coverage (and will cover possible UTI)  CT scan of thoracic and lumbar for concern for discitis I also would like to have him get a CT of his right hip while he is there to evaluate right prosthetic hip  Repeat blood cultures If Staph aureus, he also will need a TEE with possibility of pacemaker infection  Assessment: He has GPC in blood, concern is high for Staph aureus bacteremia.     Antibiotics: Vancomycin day 1 Ceftriaxone day 2  HPI: [name] is a 79 y.o. male with multiple medical issues including afib on coumadin, BPH, pacemaker placement 10 years ago at [facility], right hip prosthetic joint placement, diabetes and laminectomy in January of 2014 who came in with right leg pain, weakness and subjective fever and chills.  He had been doing well until about 1 week prior to presentation when he developed those symptoms.  He did see his orthopedist, Dr. Turner Daniels, who evaluated his hip replacement with x rays and exam and found it to be fine.  He then went to see Dr. Yetta Barre in his office and admitted  from there for above.  He had electrolyte abnormalities, increased WBC and his blood cultures are now growing GPC 2/2 in clusters.  INR also supratherapeutic and acute on chronic renal failure.     Review of Systems: A comprehensive review of systems was negative.  Past Medical History  Diagnosis Date  . Atrial fibrillation   . Hypertension   . Pacemaker   . Diabetes mellitus without complication     Borderline  . Arthritis   . Anxiety   . Depression     History  Substance Use Topics  . Smoking status: Never Smoker   . Smokeless tobacco: Not on file  . Alcohol Use: No    No family history on file. No Known Allergies  OBJECTIVE: Blood pressure 100/55, pulse 88, temperature 97.4 F (36.3 C), temperature source Oral, resp. rate 18, height 5' 11.5" (1.816 m), weight 244 lb 1.6 oz (110.723 kg), SpO2 94.00%. General: Awake, alert, nad Skin: no rash Lungs: CTA B Cor: no murmu Abdomen: soft, nt, nd Ext: difficult to evaluate right hip with pain, decreased ROM due to pain Chest: PM pocket with no erythema or tenderness  Microbiology: Recent Results (from the past 240 hour(s))  CULTURE, BLOOD (ROUTINE X 2)     Status: None   Collection Time    08/26/12 11:20 AM      Result Value Range Status  Specimen Description BLOOD LEFT ARM   Final   Special Requests BOTTLES DRAWN AEROBIC AND ANAEROBIC 10CC   Final   Culture  Setup Time 08/26/2012 16:54   Final   Culture     Final   Value: GRAM POSITIVE COCCI IN CLUSTERS     2 Note: Gram Stain Report Called to,Read Back By and Verified With: KELSEY [place] RN ON8 14 @420  EDMOJ   Report Status PENDING   Incomplete  CULTURE, BLOOD (ROUTINE X 2)     Status: None   Collection Time    08/26/12 11:30 AM      Result Value Range Status   Specimen Description BLOOD LEFT HAND   Final   Special Requests     Final   Value: BOTTLES DRAWN AEROBIC AND ANAEROBIC AERO 10CC ANA 5CC   Culture  Setup Time 08/26/2012 16:54   Final   Culture      Final   Value: GRAM POSITIVE COCCI IN CLUSTERS     2 Note: Gram Stain Report Called to,Read Back By and Verified With: KELSEY [place] RN ON8 14 @420  EDMOJ   Report Status PENDING   Incomplete    COMER, Molly Maduro, MD [facility] [email or web address] [phone] pager  [phone] cell 08/27/2012, 3:59 PM

## 2012-08-27 NOTE — Progress Notes (Signed)
[facility] PROGRESS NOTE  Bleu Inks  WUJ:[id number]  DOB: 1930-07-04  DOA: 08/25/2012 PCP: Malka So., MD  HPI/Subjective: 08/27/2012 8:44 PM  i was called by Dr Lovell Sheehan neurosurgery about his CT of the hip which is suggestive of possible complex fluid collection in the right hip joint with local spread to abdomen in retroperitoneal region. i discuss with the pt, Pt had a right hip arthroplasty done in oct-2012 by Dr Turner Daniels [facility]. He mentions that he has not acute new complain.  Objective: Filed Vitals:   08/27/12 1009 08/27/12 1414 08/27/12 1800 08/27/12 2100  BP: 103/52 100/55 108/62 148/80  Pulse: 96 88 86 62  Temp: 98.6 F (37 C) 97.4 F (36.3 C) 97.6 F (36.4 C) 99.9 F (37.7 C)  TempSrc: Oral Oral Oral Oral  Resp: 20 18 16 18   Height:      Weight:      SpO2: 92% 94% 99%    Data Reviewed: CT-scan of abdomen and pelvis   Assessment and Plan: CT of the abdomen is already ordered by Dr Lovell Sheehan. Pt is on IV Vancomycin and IV cefazolin per ID recommendation.  I Discussed the findings with Dr Ave Filter from [facility] orth[facility], over phone who recc. IR guided aspiration of the fluid in the joint space in the AM. And possible general surgery evaluation for the retroperitoneal fluid collection. No need for acute intervention per them for the joint. They will see the patient in the AM. IR can also collect fluid sample and drain the retroperitoneal fluid in AM.  I Discussed the plan with patient.   Author: Lynden Oxford, MD [facility] Pager: [phone] 08/27/2012    If 7PM-7AM, please contact night-coverage at [email or web address], password [facility]

## 2012-08-28 ENCOUNTER — Inpatient Hospital Stay (HOSPITAL_COMMUNITY): Payer: Medicare Other

## 2012-08-28 DIAGNOSIS — T888XXA Other specified complications of surgical and medical care, not elsewhere classified, initial encounter: Secondary | ICD-10-CM

## 2012-08-28 DIAGNOSIS — R188 Other ascites: Secondary | ICD-10-CM

## 2012-08-28 DIAGNOSIS — Z5189 Encounter for other specified aftercare: Secondary | ICD-10-CM

## 2012-08-28 DIAGNOSIS — K661 Hemoperitoneum: Secondary | ICD-10-CM | POA: Diagnosis present

## 2012-08-28 DIAGNOSIS — K683 Retroperitoneal hematoma: Secondary | ICD-10-CM | POA: Diagnosis present

## 2012-08-28 LAB — GLUCOSE, CAPILLARY: Glucose-Capillary: 163 mg/dL — ABNORMAL HIGH (ref 70–99)

## 2012-08-28 LAB — COMPREHENSIVE METABOLIC PANEL
ALT: 49 U/L (ref 0–53)
AST: 82 U/L — ABNORMAL HIGH (ref 0–37)
CO2: 33 mEq/L — ABNORMAL HIGH (ref 19–32)
Calcium: 8.5 mg/dL (ref 8.4–10.5)
Potassium: 3 mEq/L — ABNORMAL LOW (ref 3.5–5.1)
Sodium: 134 mEq/L — ABNORMAL LOW (ref 135–145)
Total Protein: 6.3 g/dL (ref 6.0–8.3)

## 2012-08-28 LAB — CBC
HCT: 29.5 % — ABNORMAL LOW (ref 39.0–52.0)
Hemoglobin: 9.6 g/dL — ABNORMAL LOW (ref 13.0–17.0)
MCH: 29.2 pg (ref 26.0–34.0)
MCHC: 32.5 g/dL (ref 30.0–36.0)

## 2012-08-28 LAB — PROTIME-INR
INR: 1.59 — ABNORMAL HIGH (ref 0.00–1.49)
Prothrombin Time: 18.5 seconds — ABNORMAL HIGH (ref 11.6–15.2)

## 2012-08-28 MED ORDER — POTASSIUM CHLORIDE CRYS ER 20 MEQ PO TBCR
40.0000 meq | EXTENDED_RELEASE_TABLET | ORAL | Status: AC
  Administered 2012-08-28 (×2): 40 meq via ORAL
  Filled 2012-08-28 (×2): qty 2

## 2012-08-28 MED ORDER — CEFAZOLIN SODIUM 1-5 GM-% IV SOLN
1.0000 g | Freq: Three times a day (TID) | INTRAVENOUS | Status: DC
  Administered 2012-08-28 – 2012-09-05 (×21): 1 g via INTRAVENOUS
  Filled 2012-08-28 (×30): qty 50

## 2012-08-28 MED ORDER — POTASSIUM CHLORIDE CRYS ER 20 MEQ PO TBCR
40.0000 meq | EXTENDED_RELEASE_TABLET | Freq: Every day | ORAL | Status: DC
  Administered 2012-08-29 – 2012-09-06 (×9): 40 meq via ORAL
  Filled 2012-08-28 (×9): qty 2

## 2012-08-28 NOTE — Consult Note (Signed)
Pt seen examined and agree.  CT reviewed with IR.  Try aspiration/percutaneous drainage if possible.  Agree with IV abx.  No sign of sepsis.  Not sure hip changes are from hip or tracking from RP.  Discussed with Dr Ave Filter.  No emergent exploration necessary unless he becomes septic or does not improve.  Would hold  Coumadin since is like ly secondary to that.

## 2012-08-28 NOTE — Consult Note (Signed)
Reason for Consult:retroperitoneal fluid collection Referring Physician: Dr. Yetta Barre  HPI: [name] is an 79 year old male with a PMH of atrial fibrillation, hypertension, diabetes mellitus, arthritis, pacemaker and chronic anticoagulation therapy who presented with back pain and RLE weakness on 08/25/12.  The patient recalls waking up on Thursday morning with right hip and leg pain.  Moderate-severe in severity.  Associated with weakness.  He was essentially unable to move the extremity of bear weight.  Prior to onset of symptoms he develop fever and chills on Tuesday morning.  He denies paresthesias.  Exacerbated with activity.  Alleviated with pain medication and rest.  He has a history of right hip replacement in 2012 and lumbar laminectomy in 2014.  Upon admission his INR was 7.1.  He was given FFP And vitamin K which he responded well to.  His INR today is 1.59.  He was found to have leukocytosis and GPC in blood.  Infectious disease was consulted and vancomycin and rocephin were started. He then had a CT of abdomen and pelvis which revealed an extensive fluid collection right lower retroperitoneum and right hip which could represent an abscess or hematoma.  This warranted a orthopedic and general surgery consultation.    Past Medical History  Diagnosis Date  . Atrial fibrillation   . Hypertension   . Pacemaker   . Diabetes mellitus without complication     Borderline  . Arthritis   . Anxiety   . Depression     Past Surgical History  Procedure Laterality Date  . Pacemaker insertion    . Lumbar laminectomy/decompression microdiscectomy  02/14/2012    Procedure: LUMBAR LAMINECTOMY/DECOMPRESSION MICRODISCECTOMY 1 LEVEL;  Surgeon: Tia Alert, MD;  Location: [facility];  Service: Neurosurgery;  Laterality: N/A;  Thoracic twelve - Lumbar one decompressive laminectomy.    No family history on file.  Social History:  reports that he has never smoked. He does not have any smokeless  tobacco history on file. He reports that he does not drink alcohol or use illicit drugs.  Allergies: No Known Allergies  Medications: I have reviewed the patient's current medications.  Results for orders placed during the hospital encounter of 08/25/12 (from the past 48 hour(s))  COMPREHENSIVE METABOLIC PANEL     Status: Abnormal   Collection Time    08/26/12 11:20 AM      Result Value Range   Sodium 134 (*) 135 - 145 mEq/L   Potassium 2.7 (*) 3.5 - 5.1 mEq/L   Comment: CRITICAL RESULT CALLED TO, READ BACK BY AND VERIFIED WITH:     C WITHROW,RN 1240 08/26/12 D BRADLEY   Chloride 91 (*) 96 - 112 mEq/L   CO2 33 (*) 19 - 32 mEq/L   Glucose, Bld 139 (*) 70 - 99 mg/dL   BUN 71 (*) 6 - 23 mg/dL   Creatinine, Ser 8.11 (*) 0.50 - 1.35 mg/dL   Calcium 8.6  8.4 - 91.4 mg/dL   Total Protein 6.3  6.0 - 8.3 g/dL   Albumin 1.8 (*) 3.5 - 5.2 g/dL   AST 782 (*) 0 - 37 U/L   ALT 78 (*) 0 - 53 U/L   Alkaline Phosphatase 89  39 - 117 U/L   Total Bilirubin 0.7  0.3 - 1.2 mg/dL   GFR calc non Af Amer 23 (*) >90 mL/min   GFR calc Af Amer 26 (*) >90 mL/min   Comment:            The  eGFR has been calculated     using the CKD EPI equation.     This calculation has not been     validated in all clinical     situations.     eGFR's persistently     <90 mL/min signify     possible Chronic Kidney Disease.  MAGNESIUM     Status: Abnormal   Collection Time    08/26/12 11:20 AM      Result Value Range   Magnesium 2.7 (*) 1.5 - 2.5 mg/dL  PROTIME-INR     Status: Abnormal   Collection Time    08/26/12 11:20 AM      Result Value Range   Prothrombin Time 50.7 (*) 11.6 - 15.2 seconds   INR 5.95 (*) 0.00 - 1.49   Comment: CRITICAL RESULT CALLED TO, READ BACK BY AND VERIFIED WITH:     CONIN WITHROWE,RN AT 1230 08/26/12 BY ZPERRY.  CULTURE, BLOOD (ROUTINE X 2)     Status: None   Collection Time    08/26/12 11:20 AM      Result Value Range   Specimen Description BLOOD LEFT ARM     Special Requests BOTTLES  DRAWN AEROBIC AND ANAEROBIC 10CC     Culture  Setup Time 08/26/2012 16:54     Culture       Value: STAPHYLOCOCCUS AUREUS     2 Note: Gram Stain Report Called to,Read Back By and Verified With: KELSEY [place] RN ON8 14 @420  EDMOJ   Report Status PENDING    CULTURE, BLOOD (ROUTINE X 2)     Status: None   Collection Time    08/26/12 11:30 AM      Result Value Range   Specimen Description BLOOD LEFT HAND     Special Requests       Value: BOTTLES DRAWN AEROBIC AND ANAEROBIC AERO 10CC ANA 5CC   Culture  Setup Time 08/26/2012 16:54     Culture       Value: STAPHYLOCOCCUS AUREUS     Note: RIFAMPIN AND GENTAMICIN SHOULD NOT BE USED AS SINGLE DRUGS FOR TREATMENT OF STAPH INFECTIONS.     2 Note: Gram Stain Report Called to,Read Back By and Verified With: KELSEY [place] RN ON8 14 @420  EDMOJ   Report Status PENDING    GLUCOSE, CAPILLARY     Status: Abnormal   Collection Time    08/26/12  4:52 PM      Result Value Range   Glucose-Capillary 144 (*) 70 - 99 mg/dL   Comment 1 Documented in Chart     Comment 2 Notify RN    BASIC METABOLIC PANEL     Status: Abnormal   Collection Time    08/26/12  9:18 PM      Result Value Range   Sodium 136  135 - 145 mEq/L   Potassium 2.3 (*) 3.5 - 5.1 mEq/L   Comment: CRITICAL RESULT CALLED TO, READ BACK BY AND VERIFIED WITH:     C.[place] RN 2229 08/26/12 E.GADDY   Chloride 90 (*) 96 - 112 mEq/L   CO2 34 (*) 19 - 32 mEq/L   Glucose, Bld 146 (*) 70 - 99 mg/dL   BUN 64 (*) 6 - 23 mg/dL   Creatinine, Ser 4.09 (*) 0.50 - 1.35 mg/dL   Calcium 9.0  8.4 - 81.1 mg/dL   GFR calc non Af Amer 29 (*) >90 mL/min   GFR calc Af Amer 33 (*) >90 mL/min   Comment:  The eGFR has been calculated     using the CKD EPI equation.     This calculation has not been     validated in all clinical     situations.     eGFR's persistently     <90 mL/min signify     possible Chronic Kidney Disease.  GLUCOSE, CAPILLARY     Status: Abnormal   Collection Time    08/27/12  12:30 AM      Result Value Range   Glucose-Capillary 157 (*) 70 - 99 mg/dL   Comment 1 Documented in Chart     Comment 2 Notify RN    PROTIME-INR     Status: Abnormal   Collection Time    08/27/12  5:30 AM      Result Value Range   Prothrombin Time 16.6 (*) 11.6 - 15.2 seconds   INR 1.38  0.00 - 1.49  CBC WITH DIFFERENTIAL     Status: Abnormal   Collection Time    08/27/12  5:30 AM      Result Value Range   WBC 17.0 (*) 4.0 - 10.5 K/uL   RBC 3.25 (*) 4.22 - 5.81 MIL/uL   Hemoglobin 9.9 (*) 13.0 - 17.0 g/dL   HCT 47.8 (*) 29.5 - 62.1 %   MCV 88.3  78.0 - 100.0 fL   MCH 30.5  26.0 - 34.0 pg   MCHC 34.5  30.0 - 36.0 g/dL   RDW 30.8 (*) 65.7 - 84.6 %   Platelets 218  150 - 400 K/uL   Neutrophils Relative % 90 (*) 43 - 77 %   Lymphocytes Relative 4 (*) 12 - 46 %   Monocytes Relative 6  3 - 12 %   Eosinophils Relative 0  0 - 5 %   Basophils Relative 0  0 - 1 %   Neutro Abs 15.3 (*) 1.7 - 7.7 K/uL   Lymphs Abs 0.7  0.7 - 4.0 K/uL   Monocytes Absolute 1.0  0.1 - 1.0 K/uL   Eosinophils Absolute 0.0  0.0 - 0.7 K/uL   Basophils Absolute 0.0  0.0 - 0.1 K/uL   WBC Morphology TOXIC GRANULATION     Smear Review PLATELETS APPEAR ADEQUATE    COMPREHENSIVE METABOLIC PANEL     Status: Abnormal   Collection Time    08/27/12  5:30 AM      Result Value Range   Sodium 137  135 - 145 mEq/L   Potassium 2.6 (*) 3.5 - 5.1 mEq/L   Comment: CRITICAL RESULT CALLED TO, READ BACK BY AND VERIFIED WITH:     [place] Campbell ON [id number] AT 0624 BY SHIPMANM   Chloride 91 (*) 96 - 112 mEq/L   CO2 34 (*) 19 - 32 mEq/L   Glucose, Bld 153 (*) 70 - 99 mg/dL   BUN 59 (*) 6 - 23 mg/dL   Creatinine, Ser 8.41 (*) 0.50 - 1.35 mg/dL   Calcium 8.9  8.4 - 32.4 mg/dL   Total Protein 6.4  6.0 - 8.3 g/dL   Albumin 2.3 (*) 3.5 - 5.2 g/dL   AST 401 (*) 0 - 37 U/L   ALT 60 (*) 0 - 53 U/L   Alkaline Phosphatase 97  39 - 117 U/L   Total Bilirubin 1.2  0.3 - 1.2 mg/dL   GFR calc non Af Amer 31 (*) >90 mL/min   GFR calc Af  Amer 36 (*) >90 mL/min   Comment:  The eGFR has been calculated     using the CKD EPI equation.     This calculation has not been     validated in all clinical     situations.     eGFR's persistently     <90 mL/min signify     possible Chronic Kidney Disease.  HEMOGLOBIN A1C     Status: Abnormal   Collection Time    08/27/12  5:30 AM      Result Value Range   Hemoglobin A1C 6.7 (*) <5.7 %   Comment: (NOTE)                                                                               According to the ADA Clinical Practice Recommendations for 2011, when     HbA1c is used as a screening test:      >=6.5%   Diagnostic of Diabetes Mellitus               (if abnormal result is confirmed)     5.7-6.4%   Increased risk of developing Diabetes Mellitus     References:Diagnosis and Classification of Diabetes Mellitus,Diabetes     Care,2011,34(Suppl 1):S62-S69 and Standards of Medical Care in             Diabetes - 2011,Diabetes Care,2011,34 (Suppl 1):S11-S61.   Mean Plasma Glucose 146 (*) <117 mg/dL  GLUCOSE, CAPILLARY     Status: Abnormal   Collection Time    08/27/12  6:48 AM      Result Value Range   Glucose-Capillary 158 (*) 70 - 99 mg/dL   Comment 1 Documented in Chart     Comment 2 Notify RN    GLUCOSE, CAPILLARY     Status: Abnormal   Collection Time    08/27/12 11:42 AM      Result Value Range   Glucose-Capillary 157 (*) 70 - 99 mg/dL   Comment 1 Documented in Chart     Comment 2 Notify RN    BASIC METABOLIC PANEL     Status: Abnormal   Collection Time    08/27/12  2:35 PM      Result Value Range   Sodium 138  135 - 145 mEq/L   Potassium 3.7  3.5 - 5.1 mEq/L   Chloride 95 (*) 96 - 112 mEq/L   CO2 35 (*) 19 - 32 mEq/L   Glucose, Bld 152 (*) 70 - 99 mg/dL   BUN 54 (*) 6 - 23 mg/dL   Creatinine, Ser 1.61 (*) 0.50 - 1.35 mg/dL   Calcium 8.7  8.4 - 09.6 mg/dL   GFR calc non Af Amer 34 (*) >90 mL/min   GFR calc Af Amer 40 (*) >90 mL/min   Comment:             The eGFR has been calculated     using the CKD EPI equation.     This calculation has not been     validated in all clinical     situations.     eGFR's persistently     <90 mL/min signify     possible Chronic Kidney Disease.  GLUCOSE, CAPILLARY     Status:  Abnormal   Collection Time    08/27/12  4:45 PM      Result Value Range   Glucose-Capillary 137 (*) 70 - 99 mg/dL  GLUCOSE, CAPILLARY     Status: Abnormal   Collection Time    08/27/12  9:46 PM      Result Value Range   Glucose-Capillary 176 (*) 70 - 99 mg/dL  PROTIME-INR     Status: Abnormal   Collection Time    08/28/12 12:35 AM      Result Value Range   Prothrombin Time 18.5 (*) 11.6 - 15.2 seconds   INR 1.59 (*) 0.00 - 1.49  COMPREHENSIVE METABOLIC PANEL     Status: Abnormal   Collection Time    08/28/12 12:35 AM      Result Value Range   Sodium 134 (*) 135 - 145 mEq/L   Potassium 3.0 (*) 3.5 - 5.1 mEq/L   Comment: DELTA CHECK NOTED   Chloride 93 (*) 96 - 112 mEq/L   CO2 33 (*) 19 - 32 mEq/L   Glucose, Bld 160 (*) 70 - 99 mg/dL   BUN 45 (*) 6 - 23 mg/dL   Creatinine, Ser 6.96 (*) 0.50 - 1.35 mg/dL   Calcium 8.5  8.4 - 29.5 mg/dL   Total Protein 6.3  6.0 - 8.3 g/dL   Albumin 1.9 (*) 3.5 - 5.2 g/dL   AST 82 (*) 0 - 37 U/L   ALT 49  0 - 53 U/L   Alkaline Phosphatase 94  39 - 117 U/L   Total Bilirubin 1.3 (*) 0.3 - 1.2 mg/dL   GFR calc non Af Amer 42 (*) >90 mL/min   GFR calc Af Amer 49 (*) >90 mL/min   Comment:            The eGFR has been calculated     using the CKD EPI equation.     This calculation has not been     validated in all clinical     situations.     eGFR's persistently     <90 mL/min signify     possible Chronic Kidney Disease.  CBC     Status: Abnormal   Collection Time    08/28/12 12:35 AM      Result Value Range   WBC 14.4 (*) 4.0 - 10.5 K/uL   RBC 3.29 (*) 4.22 - 5.81 MIL/uL   Hemoglobin 9.6 (*) 13.0 - 17.0 g/dL   HCT 28.4 (*) 13.2 - 44.0 %   MCV 89.7  78.0 - 100.0 fL   MCH 29.2   26.0 - 34.0 pg   MCHC 32.5  30.0 - 36.0 g/dL   RDW 10.2 (*) 72.5 - 36.6 %   Platelets 224  150 - 400 K/uL  GLUCOSE, CAPILLARY     Status: Abnormal   Collection Time    08/28/12  6:44 AM      Result Value Range   Glucose-Capillary 186 (*) 70 - 99 mg/dL   Comment 1 Notify RN      Ct Abdomen Pelvis Wo Contrast  08/28/2012   *RADIOLOGY REPORT*  Clinical Data: Evaluate abscess.  CT ABDOMEN AND PELVIS WITHOUT CONTRAST  Technique:  Multidetector CT imaging of the abdomen and pelvis was performed following the standard protocol without intravenous contrast.  Comparison: CT of the right hip from the same date.  Findings:  BODY WALL: Unremarkable.  LOWER CHEST:  Mediastinum: Single chamber right ventricular pacer.  Coronary artery atherosclerosis.  Lungs/pleura: Small  bilateral pleural effusions and associated atelectasis.  ABDOMEN/PELVIS:  Liver: No focal abnormality.  Biliary: Layering stones.  No evidence of acute cholecystitis.  Pancreas: Fatty atrophy.  Spleen: Unremarkable.  Adrenals: Unremarkable.  Kidneys and ureters: There are two exophytic right renal lesions in the interpolar region.  The largest measures 2.6 cm and is compatible with cyst - confirmed by recent sonography.  The second measures 15 mm and is soft tissue density. This was not definitely seen on prior ultrasound. This lesion may have grown mildly since imaging 1August 12, 202013.  No hydronephrosis or stone.  Bladder: Mild distension by urine.  Bowel:  No definite communication between the bowel and retroperitoneal collection.  Unfortunately, enteric contrast has not yet reached the colon at time of imaging.   Normal appendix.  Retroperitoneum:Extensive right retroperitoneal hematoma gas and fluid collection, involving the lower posterior pararenal space, the right iliacus muscle, proximal right rectus femoris, iliopsoas bursa.  The largest posterior pararenal component is at the pelvic brim, measuring 9 x 4 by 6 cm.  The psoas muscle is spared.   No evidence of epidural extension of infection.  The neighboring osseous structures show no evidence of osteomyelitis.  Evaluation of the total right hip prosthesis was better accomplished on dedicated CT imaging. There is a relatively large collection posterior to the right femoral neck and greater trochanter, containing fluid and gas, measuring 7 x 3 x 3 cm. High density material present within the expanded right iliacus, measuring approximately 5 cm in diameter.  Peritoneum: No free fluid or gas.  Reproductive: Prostate enlargement calcifications.  Vascular: Extensive atherosclerotic calcification.  OSSEOUS: Total right hip prosthesis with surrounding fluid collections, as above.  Extensive degenerative disc disease with multilevel fusion and laminectomy.  No evidence of disc space infection. No suspicious lytic or blastic lesions.  IMPRESSION:  1.  Extensive complex fluid collection, likely abscess, involving the lower right retroperitoneum and right periarticular hip - size and extent described above. Although there is extensive gas within the collection, no bowel communication identified.  Probable hematoma within the iliacus component.  2. Indeterminate, possibly solid 15 mm lesion in the interpolar right kidney.  Followup imaging advised.  3. Cholelithiasis.   Original Report Authenticated By: Tiburcio Pea   Dg Hip Complete Right  08/28/2012   *RADIOLOGY REPORT*  Clinical Data: 79 year old male with right hip pain.  RIGHT HIP - COMPLETE 2+ VIEW  Comparison: 11/26/2010 radiographs  Findings: Right total hip replacement again identified. There is no evidence of fracture, subluxation or dislocation. No complicating features are identified.  IMPRESSION: Right total hip replacement without acute abnormality.   Original Report Authenticated By: Harmon Pier, M.D.   Ct Lumbar Spine Wo Contrast  08/27/2012   *RADIOLOGY REPORT*  Clinical Data: Low back/right leg pain, fever, positive blood cultures  CT LUMBAR  SPINE WITHOUT CONTRAST  Technique:  Multidetector CT imaging of the lumbar spine was performed without intravenous contrast administration. Multiplanar CT image reconstructions were also generated.  Comparison: CT myelogram dated 02/10/2012.  Findings: No evidence of fracture or dislocation.  Vertebral body heights are maintained.  Postsurgical changes related to prior T12-L1 posterior laminectomy/discectomy and prior L4-S1 posterior laminectomy.  Prior L3-4 fusion.  Moderate to severe multilevel degenerative changes, unchanged from prior CT myelogram.  This includes mild to moderate central canal stenosis of the upper lumbar spine and moderate to severe central canal stenosis of the lower lumbar spine with nerve root encroachment at multiple levels.  No evidence of epidural fluid collection/abscess.  Complex  fluid collection in the right abdomen/pelvis, incompletely visualized.  IMPRESSION: Postsurgical changes involving the lower thoracolumbar spine.  Stable moderate to severe multilevel degenerative changes, unchanged from prior CT myelogram.  No evidence of epidural fluid collection/abscess.  Complex fluid collection in the right abdomen/pelvis, incompletely visualized.  Please refer to CT right hip report for further assessment.   Original Report Authenticated By: Charline Bills, M.D.   US Abdomen Complete  08/26/2012   *RADIOLOGY REPORT*  Clinical Data:  79 year old with abnormal liver function tests.  COMPLETE ABDOMINAL ULTRASOUND  Comparison:  Unenhanced CT abdomen and pelvis 12020-07-1411 [place].  Abdominal ultrasound 05/21/2011 [facility].  Findings:  Gallbladder:  Small shadowing gallstones.  Patient unable to turn into the lateral decubitus position to confirm movement of the stones, but these were present on the prior ultrasound examination. No gallbladder wall thickening or pericholecystic fluid.  Negative sonographic Murphy's sign according to the ultrasound technologist.  Common bile  duct:  Normal in caliber with maximum diameter approximating 3 mm.  Liver:  Diffusely increased and coarsened echotexture without focal parenchymal abnormality.  Patent portal vein with hepatopetal flow.  IVC:  Patent in its intrahepatic portion.  Obscured outside the liver by bowel gas.  Pancreas:  Obscured by midline bowel gas and therefore not evaluated.  Spleen:  Normal size and echotexture without focal parenchymal abnormality.  Right Kidney:  No hydronephrosis.  Well-preserved cortex for age. Cysts arising from the mid and lower pole as noted previously, the cyst arising from the mid kidney approximating 2.8 cm and the cyst arising from the lower pole approximating 3.1 cm.  No solid renal masses.  Normal parenchymal echotexture.  Approximately 12.0 cm in length.  Left Kidney:  No hydronephrosis.  Well-preserved cortex for age. Normal parenchymal echotexture.  No focal parenchymal abnormality. Approximately 10.7 cm in length.  Abdominal aorta:  Obscured by midline bowel gas and therefore not evaluated.  IMPRESSION:  1.  Cholelithiasis.  No sonographic evidence of acute cholecystitis.  No biliary ductal dilation. 2.  Diffuse hepatic steatosis without focal hepatic parenchymal abnormality. 3.  Midline structures (pancreas, extrahepatic IVC, and abdominal aorta) obscured by overlying bowel gas and therefore not evaluated.   Original Report Authenticated By: Hulan Saas, M.D.   Ct Hip Right Wo Contrast  08/27/2012   *RADIOLOGY REPORT*  Clinical Data: Low back/right leg pain, fever, positive blood cultures  CT OF THE RIGHT HIP WITHOUT CONTRAST  Technique:  Multidetector CT imaging was performed according to the standard protocol. Multiplanar CT image reconstructions were also generated.  Comparison: CT abdomen pelvis dated 12020-07-1411.  Findings: Right total hip arthroplasty.  No evidence of fracture or dislocation.  Complex fluid collection/debris involving the right hip (series 5/image 36), right  iliopsoas/iliacus muscles (series 5/images 9 and 23), and the visualized right lower retroperitoneum (series 5/image 1).  Representative axial measurements include: --3.8 x 8.3 cm in the right retroperitoneum (series 5/image 5) --9.2 x 5.6 cm in the right iliacus muscle (series 5/image 13) --12.7 x 7.3 cm along the right hip (series 5/image 37)  Based on this study, it is unclear whether this reflects a right hip infection which secondarily involves the lower abdomen/pelvis or whether this originated in the lower abdomen/pelvis and now extends to the right hip.  IMPRESSION: Right total hip arthroplasty.  Complex fluid collection/debris involving the right hip, right iliopsoas/iliacus muscles, and visualized right lower retroperitoneum, as described above.  Based on this study, it is unclear whether this reflects a right hip infection which  secondarily involves the lower abdomen/pelvis (favored) or whether this originated in the lower abdomen/pelvis and now extends to the right hip.  Consider CT abdomen/pelvis (preferably with half dose contrast) for further evaluation.  These results were called by telephone on 08/27/2012 at 2000 hrs to Dr. Tressie Stalker, who verbally acknowledged these results.   Original Report Authenticated By: Charline Bills, M.D.   Dg Abd Portable 2v  08/26/2012   *RADIOLOGY REPORT*  Clinical Data: Abdominal pain.  PORTABLE ABDOMEN - 2 VIEW  Comparison: [facility] images for CT scan of the lumbar spine dated 02/10/2012  Findings: There is no free air in the abdomen.  There is air scattered throughout nondistended loops of large and small bowel. No excessive stool in the colon.  Diffuse degenerative changes of the lumbar spine.  Right hip prosthesis.  IMPRESSION: No acute abnormalities.   Original Report Authenticated By: Francene Boyers, M.D.    Review of Systems  Constitutional: Positive for fever, chills and malaise/fatigue.  Respiratory: Negative for shortness of breath and wheezing.    Cardiovascular: Negative for chest pain, palpitations and leg swelling.  Gastrointestinal: Negative for nausea, vomiting, abdominal pain, diarrhea, constipation and blood in stool.  Genitourinary: Positive for flank pain. Negative for dysuria, urgency, frequency and hematuria.  Musculoskeletal: Positive for joint pain. Negative for back pain.  Neurological: Positive for weakness. Negative for dizziness, tingling, sensory change, speech change, seizures and loss of consciousness.  Psychiatric/Behavioral: Negative for depression.   Blood pressure 118/53, pulse 84, temperature 99.2 F (37.3 C), temperature source Oral, resp. rate 20, height 5' 11.5" (1.816 m), weight 244 lb 1.6 oz (110.723 kg), SpO2 93.00%. Physical Exam  Constitutional: He is oriented to person, place, and time. He appears well-developed and well-nourished. No distress.  HENT:  Head: Normocephalic and atraumatic.  Eyes: Conjunctivae and EOM are normal. Pupils are equal, round, and reactive to light. Right eye exhibits no discharge. Left eye exhibits no discharge. No scleral icterus.  Cardiovascular: Normal heart sounds and intact distal pulses.  An irregularly irregular rhythm present. Exam reveals no gallop and no friction rub.   No murmur heard. Respiratory: Effort normal and breath sounds normal. He has no wheezes. He exhibits no tenderness.  Coarse LLL anteriorly   GI: Soft. Bowel sounds are normal. He exhibits no mass. There is no rebound and no guarding.  Right flank tenderness, no ecchymosis or masses  Genitourinary:  Condom catheter  Musculoskeletal: He exhibits no edema.  Lumbar region tenderness and right hip tenderness without apparent erythema or effusion  Lymphadenopathy:    He has no cervical adenopathy.  Neurological: He is alert and oriented to person, place, and time.  Push and pulls are strong and equal  Skin: Skin is warm and dry. No rash noted. He is not diaphoretic. No erythema. No pallor.   Psychiatric: He has a normal mood and affect. His behavior is normal. Judgment and thought content normal.    Assessment/Plan: Hospital problems managed by Neuro, Internal medicine, ID GPC bacteremia  ARF RLE weakness UTI Atrial fibrillation supratherapeutic INR Diabetes mellitus Type II Anemia  Large retroperitoneal fluid collection, abscess versus hematoma INR has been reversed.  He is afebrile, hemodynamically stable and WBC count is trending down with Vancomycin.  We will proceed with percutaneous drainage by IR, this has been reviewed with radiology and will do the procedure today or tomorrow depending on availability.  Dr. Ave Filter is also following the patient and plans for surgical intervention likely following the IR drain placement.  We will continue to follow along.  Thank you for the consult.  Carri Spillers 08/28/2012, 11:05 AM

## 2012-08-28 NOTE — Progress Notes (Signed)
[facility] PROGRESS NOTE  Geffrey Schnick JYN:[id number] DOB: 1930/04/03 DOA: 08/25/2012 PCP: Malka So., MD  Assessment/Plan: #1 bacteremia Questionable etiology. Preliminary blood cultures were 2 out of 2 gram-positive cocci in clusters. Sensitivities pending . CT of the T. and L-spine pending. CT of the hip abdomen and pelvis concerning for complex fluid collection of the left hip and possible retroperitoneal hematoma. Continue empiric IV vancomycin and cefazolin as per ID recommendations. ID following and appreciate input and recommendations.  #2 UTI Urine cultures are pending. Continue IV cefazolin.  #3 Large retroperitoneal complex fluid collection and complex fluid collection of the right hip abscess vs hematoma Patient noted to have an INR as high as 7.19 on day of admission. INR has been reversed. CT of the hip and CT of the abdomen and pelvis worrisome for complex fluid collection around the right hip and also retroperitoneal hematoma. Will discontinue full dose Lovenox. Gen. surgery and orthopedics have been consulted. May need interventional radiology for aspiration and possible drainage. If aspirated will likely need to send for Gram stain and culture. Continue empiric IV antibiotics. Continue supportive care. Warm compresses. Follow.   #4 right lower extremity weakness and back pain May be secondary to retroperitoneal hematoma and possible complex fluid collection noted around the right hip. Orthopedics has been consulted as general surgery. Per neurosurgery.  #5 acute renal failure/dehydration/hypokalemia Acute renal failure secondary to prerenal azotemia. Check a urine sodium and a urine creatinine. Renal function improved with hydration. Normal saline lock IV fluids. Patient also noted to be in [place] which is the likely etiology of this is hypokalemia. Replete potassium. Will place on daily potassium.  #6 supratherapeutic INR Status post vitamin K doses x2. Status  post FFP. INR is now subtherapeutic. Patient likely has a retroperitoneal hematoma and a such will hold off on anticoagulations for now. Follow.  #7 leukocytosis Likely secondary to problems #1 and 2. Urine cultures are pending. Blood cultures are still pending however preliminary with gram-positive cocci in clusters. Continue empiric IV vancomycin and cefazolin per ID recommendations. ID following and appreciate input and recommendations.  #8 right-sided abdominal tenderness Clinical improvement. Likely secondary to retroperitoneal hematoma. Abdominal ultrasound and abdominal films negative for any acute abnormalities. Abdominal ultrasound positive for hepatic steatosis. Follow. Pain management. Supportive care.  #9 atrial fibrillation Continue beta blocker for rate control. INR has been reversed. Patient is status post permanent pacemaker. Will discontinue full dose Lovenox as patient likely has a retroperitoneal hematoma. Follow.  #10 normocytic anemia Stable. Follow H&H.  #11 type 2 diabetes Continue sliding scale insulin.  Code Status: Full Family Communication: Updated patient at bedside. Disposition Plan: Per primary team.   Consultants:   infectious diseases: Dr., 08/27/2012  General surgery: Dr. Luisa Hart 08/28/2012  Orthopedic surgery: Dr. Ave Filter 08/28/2012   Procedures:  Acute abdominal series 08/26/2012  Chest x-ray 08/26/2012  Antibiotics:  IV vancomycin 08/26/2012  IV Rocephin 08/26/2012---. 08/27/12  IV Ancef pending for 08/28/2012   HPI/Subjective: Patient states right lower extremity weakness and numbness unchanged. No complaints.  Objective: Filed Vitals:   08/27/12 1800 08/27/12 2100 08/28/12 0209 08/28/12 0500  BP: 108/62 148/80 104/74 138/60  Pulse: 86 62 108 64  Temp: 97.6 F (36.4 C) 99.9 F (37.7 C) 99 F (37.2 C) 98.7 F (37.1 C)  TempSrc: Oral Oral Oral Oral  Resp: 16 18 20 20   Height:      Weight:      SpO2: 99%  99% 97%     Intake/Output Summary (  Last 24 hours) at 08/28/12 0919 Last data filed at 08/28/12 0500  Gross per 24 hour  Intake    680 ml  Output   1900 ml  Net  -1220 ml   Filed Weights   08/25/12 2128  Weight: 110.723 kg (244 lb 1.6 oz)    Exam:   General:  NAD  Cardiovascular: RRR  Respiratory: CTAB  Abdomen: SOFT/NT/ND/+BS  EXTREMITIES: Right hip tender to palpation. 0-5 right lower extremity strength. 4/5 left lower extremity shin. 5 out of 5 bilateral upper extremity strength.  Data Reviewed: Basic Metabolic Panel:  Recent Labs Lab 08/26/12 1120 08/26/12 2118 08/27/12 0530 08/27/12 1435 08/28/12 0035  NA 134* 136 137 138 134*  K 2.7* 2.3* 2.6* 3.7 3.0*  CL 91* 90* 91* 95* 93*  CO2 33* 34* 34* 35* 33*  GLUCOSE 139* 146* 153* 152* 160*  BUN 71* 64* 59* 54* 45*  CREATININE 2.51* 2.07* 1.95* 1.78* 1.51*  CALCIUM 8.6 9.0 8.9 8.7 8.5  MG 2.7*  --   --   --   --    Liver Function Tests:  Recent Labs Lab 08/26/12 1120 08/27/12 0530 08/28/12 0035  AST 192* 117* 82*  ALT 78* 60* 49  ALKPHOS 89 97 94  BILITOT 0.7 1.2 1.3*  PROT 6.3 6.4 6.3  ALBUMIN 1.8* 2.3* 1.9*   No results found for this basename: LIPASE, AMYLASE,  in the last 168 hours No results found for this basename: AMMONIA,  in the last 168 hours CBC:  Recent Labs Lab 08/25/12 2108 08/26/12 0748 08/26/12 0751 08/27/12 0530 08/28/12 0035  WBC 22.7*  --  23.7* 17.0* 14.4*  NEUTROABS  --  20.9*  --  15.3*  --   HGB 10.3*  --  10.3* 9.9* 9.6*  HCT 29.5*  --  29.7* 28.7* 29.5*  MCV 87.5  --  87.1 88.3 89.7  PLT 207  --  226 218 224   Cardiac Enzymes: No results found for this basename: CKTOTAL, CKMB, CKMBINDEX, TROPONINI,  in the last 168 hours BNP (last 3 results) No results found for this basename: PROBNP,  in the last 8760 hours CBG:  Recent Labs Lab 08/27/12 0648 08/27/12 1142 08/27/12 1645 08/27/12 2146 08/28/12 0644  GLUCAP 158* 157* 137* 176* 186*    Recent Results (from the  past 240 hour(s))  URINE CULTURE     Status: None   Collection Time    08/26/12  7:32 AM      Result Value Range Status   Specimen Description URINE, CLEAN CATCH   Final   Special Requests NONE   Final   Culture  Setup Time 08/26/2012 21:40   Final   [place] Count >=100,000 COLONIES/ML   Final   Culture GRAM NEGATIVE RODS   Final   Report Status PENDING   Incomplete  CULTURE, BLOOD (ROUTINE X 2)     Status: None   Collection Time    08/26/12 11:20 AM      Result Value Range Status   Specimen Description BLOOD LEFT ARM   Final   Special Requests BOTTLES DRAWN AEROBIC AND ANAEROBIC 10CC   Final   Culture  Setup Time 08/26/2012 16:54   Final   Culture     Final   Value: GRAM POSITIVE COCCI IN CLUSTERS     2 Note: Gram Stain Report Called to,Read Back By and Verified With: KELSEY [place] RN ON8 14 @420  EDMOJ   Report Status PENDING   Incomplete  CULTURE,  BLOOD (ROUTINE X 2)     Status: None   Collection Time    08/26/12 11:30 AM      Result Value Range Status   Specimen Description BLOOD LEFT HAND   Final   Special Requests     Final   Value: BOTTLES DRAWN AEROBIC AND ANAEROBIC AERO 10CC ANA 5CC   Culture  Setup Time 08/26/2012 16:54   Final   Culture     Final   Value: GRAM POSITIVE COCCI IN CLUSTERS     2 Note: Gram Stain Report Called to,Read Back By and Verified With: KELSEY [place] RN ON8 14 @420  EDMOJ   Report Status PENDING   Incomplete     Studies: Ct Abdomen Pelvis Wo Contrast  08/28/2012   *RADIOLOGY REPORT*  Clinical Data: Evaluate abscess.  CT ABDOMEN AND PELVIS WITHOUT CONTRAST  Technique:  Multidetector CT imaging of the abdomen and pelvis was performed following the standard protocol without intravenous contrast.  Comparison: CT of the right hip from the same date.  Findings:  BODY WALL: Unremarkable.  LOWER CHEST:  Mediastinum: Single chamber right ventricular pacer.  Coronary artery atherosclerosis.  Lungs/pleura: Small bilateral pleural effusions and associated  atelectasis.  ABDOMEN/PELVIS:  Liver: No focal abnormality.  Biliary: Layering stones.  No evidence of acute cholecystitis.  Pancreas: Fatty atrophy.  Spleen: Unremarkable.  Adrenals: Unremarkable.  Kidneys and ureters: There are two exophytic right renal lesions in the interpolar region.  The largest measures 2.6 cm and is compatible with cyst - confirmed by recent sonography.  The second measures 15 mm and is soft tissue density. This was not definitely seen on prior ultrasound. This lesion may have grown mildly since imaging 105/01/202013.  No hydronephrosis or stone.  Bladder: Mild distension by urine.  Bowel:  No definite communication between the bowel and retroperitoneal collection.  Unfortunately, enteric contrast has not yet reached the colon at time of imaging.   Normal appendix.  Retroperitoneum:Extensive right retroperitoneal hematoma gas and fluid collection, involving the lower posterior pararenal space, the right iliacus muscle, proximal right rectus femoris, iliopsoas bursa.  The largest posterior pararenal component is at the pelvic brim, measuring 9 x 4 by 6 cm.  The psoas muscle is spared.  No evidence of epidural extension of infection.  The neighboring osseous structures show no evidence of osteomyelitis.  Evaluation of the total right hip prosthesis was better accomplished on dedicated CT imaging. There is a relatively large collection posterior to the right femoral neck and greater trochanter, containing fluid and gas, measuring 7 x 3 x 3 cm. High density material present within the expanded right iliacus, measuring approximately 5 cm in diameter.  Peritoneum: No free fluid or gas.  Reproductive: Prostate enlargement calcifications.  Vascular: Extensive atherosclerotic calcification.  OSSEOUS: Total right hip prosthesis with surrounding fluid collections, as above.  Extensive degenerative disc disease with multilevel fusion and laminectomy.  No evidence of disc space infection. No suspicious  lytic or blastic lesions.  IMPRESSION:  1.  Extensive complex fluid collection, likely abscess, involving the lower right retroperitoneum and right periarticular hip - size and extent described above. Although there is extensive gas within the collection, no bowel communication identified.  Probable hematoma within the iliacus component.  2. Indeterminate, possibly solid 15 mm lesion in the interpolar right kidney.  Followup imaging advised.  3. Cholelithiasis.   Original Report Authenticated By: Tiburcio Pea   Dg Hip Complete Right  08/28/2012   *RADIOLOGY REPORT*  Clinical Data: 79 year old male  with right hip pain.  RIGHT HIP - COMPLETE 2+ VIEW  Comparison: 11/26/2010 radiographs  Findings: Right total hip replacement again identified. There is no evidence of fracture, subluxation or dislocation. No complicating features are identified.  IMPRESSION: Right total hip replacement without acute abnormality.   Original Report Authenticated By: Harmon Pier, M.D.   Ct Lumbar Spine Wo Contrast  08/27/2012   *RADIOLOGY REPORT*  Clinical Data: Low back/right leg pain, fever, positive blood cultures  CT LUMBAR SPINE WITHOUT CONTRAST  Technique:  Multidetector CT imaging of the lumbar spine was performed without intravenous contrast administration. Multiplanar CT image reconstructions were also generated.  Comparison: CT myelogram dated 02/10/2012.  Findings: No evidence of fracture or dislocation.  Vertebral body heights are maintained.  Postsurgical changes related to prior T12-L1 posterior laminectomy/discectomy and prior L4-S1 posterior laminectomy.  Prior L3-4 fusion.  Moderate to severe multilevel degenerative changes, unchanged from prior CT myelogram.  This includes mild to moderate central canal stenosis of the upper lumbar spine and moderate to severe central canal stenosis of the lower lumbar spine with nerve root encroachment at multiple levels.  No evidence of epidural fluid collection/abscess.  Complex  fluid collection in the right abdomen/pelvis, incompletely visualized.  IMPRESSION: Postsurgical changes involving the lower thoracolumbar spine.  Stable moderate to severe multilevel degenerative changes, unchanged from prior CT myelogram.  No evidence of epidural fluid collection/abscess.  Complex fluid collection in the right abdomen/pelvis, incompletely visualized.  Please refer to CT right hip report for further assessment.   Original Report Authenticated By: Charline Bills, M.D.   US Abdomen Complete  08/26/2012   *RADIOLOGY REPORT*  Clinical Data:  79 year old with abnormal liver function tests.  COMPLETE ABDOMINAL ULTRASOUND  Comparison:  Unenhanced CT abdomen and pelvis 103-27-202013 [place].  Abdominal ultrasound 05/21/2011 [facility].  Findings:  Gallbladder:  Small shadowing gallstones.  Patient unable to turn into the lateral decubitus position to confirm movement of the stones, but these were present on the prior ultrasound examination. No gallbladder wall thickening or pericholecystic fluid.  Negative sonographic Murphy's sign according to the ultrasound technologist.  Common bile duct:  Normal in caliber with maximum diameter approximating 3 mm.  Liver:  Diffusely increased and coarsened echotexture without focal parenchymal abnormality.  Patent portal vein with hepatopetal flow.  IVC:  Patent in its intrahepatic portion.  Obscured outside the liver by bowel gas.  Pancreas:  Obscured by midline bowel gas and therefore not evaluated.  Spleen:  Normal size and echotexture without focal parenchymal abnormality.  Right Kidney:  No hydronephrosis.  Well-preserved cortex for age. Cysts arising from the mid and lower pole as noted previously, the cyst arising from the mid kidney approximating 2.8 cm and the cyst arising from the lower pole approximating 3.1 cm.  No solid renal masses.  Normal parenchymal echotexture.  Approximately 12.0 cm in length.  Left Kidney:  No hydronephrosis.   Well-preserved cortex for age. Normal parenchymal echotexture.  No focal parenchymal abnormality. Approximately 10.7 cm in length.  Abdominal aorta:  Obscured by midline bowel gas and therefore not evaluated.  IMPRESSION:  1.  Cholelithiasis.  No sonographic evidence of acute cholecystitis.  No biliary ductal dilation. 2.  Diffuse hepatic steatosis without focal hepatic parenchymal abnormality. 3.  Midline structures (pancreas, extrahepatic IVC, and abdominal aorta) obscured by overlying bowel gas and therefore not evaluated.   Original Report Authenticated By: Hulan Saas, M.D.   Ct Hip Right Wo Contrast  08/27/2012   *RADIOLOGY REPORT*  Clinical Data:  Low back/right leg pain, fever, positive blood cultures  CT OF THE RIGHT HIP WITHOUT CONTRAST  Technique:  Multidetector CT imaging was performed according to the standard protocol. Multiplanar CT image reconstructions were also generated.  Comparison: CT abdomen pelvis dated 1May 19, 202013.  Findings: Right total hip arthroplasty.  No evidence of fracture or dislocation.  Complex fluid collection/debris involving the right hip (series 5/image 36), right iliopsoas/iliacus muscles (series 5/images 9 and 23), and the visualized right lower retroperitoneum (series 5/image 1).  Representative axial measurements include: --3.8 x 8.3 cm in the right retroperitoneum (series 5/image 5) --9.2 x 5.6 cm in the right iliacus muscle (series 5/image 13) --12.7 x 7.3 cm along the right hip (series 5/image 37)  Based on this study, it is unclear whether this reflects a right hip infection which secondarily involves the lower abdomen/pelvis or whether this originated in the lower abdomen/pelvis and now extends to the right hip.  IMPRESSION: Right total hip arthroplasty.  Complex fluid collection/debris involving the right hip, right iliopsoas/iliacus muscles, and visualized right lower retroperitoneum, as described above.  Based on this study, it is unclear whether this reflects a  right hip infection which secondarily involves the lower abdomen/pelvis (favored) or whether this originated in the lower abdomen/pelvis and now extends to the right hip.  Consider CT abdomen/pelvis (preferably with half dose contrast) for further evaluation.  These results were called by telephone on 08/27/2012 at 2000 hrs to Dr. Tressie Stalker, who verbally acknowledged these results.   Original Report Authenticated By: Charline Bills, M.D.   Dg Abd Portable 2v  08/26/2012   *RADIOLOGY REPORT*  Clinical Data: Abdominal pain.  PORTABLE ABDOMEN - 2 VIEW  Comparison: [facility] images for CT scan of the lumbar spine dated 02/10/2012  Findings: There is no free air in the abdomen.  There is air scattered throughout nondistended loops of large and small bowel. No excessive stool in the colon.  Diffuse degenerative changes of the lumbar spine.  Right hip prosthesis.  IMPRESSION: No acute abnormalities.   Original Report Authenticated By: Francene Boyers, M.D.    Scheduled Meds: . finasteride  5 mg Oral Daily  . insulin aspart  0-9 Units Subcutaneous TID WC  . levothyroxine  75 mcg Oral QAC breakfast  . metolazone  2.5 mg Oral Q M,W,F  . metoprolol succinate  12.5 mg Oral Daily  . potassium chloride  40 mEq Oral Q4H  . [START ON 08/29/2012] potassium chloride  40 mEq Oral Daily  . vancomycin  1,500 mg Intravenous Q24H   Continuous Infusions: . sodium chloride 75 mL/hr at 08/27/12 1924  . sodium chloride 0.9 % 1,000 mL with potassium chloride 40 mEq infusion 75 mL/hr at 08/27/12 1000    Active Problems:   HTN (hypertension)   CKD (chronic kidney disease), stage III   Atrial fibrillation   ARF (acute renal failure)   Hypokalemia   Leukocytosis, unspecified   UTI (urinary tract infection)   Supratherapeutic INR   DM type 2 (diabetes mellitus, type 2)   Bacteremia   Retroperitoneal fluid collection   Fluid collection at surgical site    Time spent: > 35 mins    [facility]  [facility] Pager [phone]. If 7PM-7AM, please contact night-coverage at [email or web address], password [facility] 08/28/2012, 9:19 AM  LOS: 3 days

## 2012-08-28 NOTE — Progress Notes (Signed)
Patient ID: [name], male   DOB: 03-15-1930, 79 y.o.   MRN: [id number] Request received for CT guided aspiration/possible drainage of rt lower pelvic/RP fluid collection ?hematoma in pt with hx of back/RLE pain/weakness . Imaging studies have been reviewed by Dr. Archer Asa and case d/w Dr. Luisa Hart. PMH sig for afib- on coumadin with recent supratherapeutic PT/INR, right THR 2012, thoracolumbar laminectomy/diskectomy 01/2012. Additional hx as below. Pt also noted to have staph bacteremia this admission. Exam: pt drowsy but alert, wife in room; chest- sl dim BS bases, left chest wall pacer  ; heart- irreg irreg; abd- soft, +BS, tender bilat pelvic regions, primarily RLQ with some radiation to RUQ; ext- no sig edema, sens/motor fxn intact.   Filed Vitals:   08/27/12 2100 08/28/12 0209 08/28/12 0500 08/28/12 0944  BP: 148/80 104/74 138/60 118/53  Pulse: 62 108 64 84  Temp: 99.9 F (37.7 C) 99 F (37.2 C) 98.7 F (37.1 C) 99.2 F (37.3 C)  TempSrc: Oral Oral Oral Oral  Resp: 18 20 20 20   Height:      Weight:      SpO2:  99% 97% 93%   Past Medical History  Diagnosis Date  . Atrial fibrillation   . Hypertension   . Pacemaker   . Diabetes mellitus without complication     Borderline  . Arthritis   . Anxiety   . Depression    Past Surgical History  Procedure Laterality Date  . Pacemaker insertion    . Lumbar laminectomy/decompression microdiscectomy  02/14/2012    Procedure: LUMBAR LAMINECTOMY/DECOMPRESSION MICRODISCECTOMY 1 LEVEL;  Surgeon: Tia Alert, MD;  Location: [facility];  Service: Neurosurgery;  Laterality: N/A;  Thoracic twelve - Lumbar one decompressive laminectomy.   Ct Abdomen Pelvis Wo Contrast  08/28/2012   *RADIOLOGY REPORT*  Clinical Data: Evaluate abscess.  CT ABDOMEN AND PELVIS WITHOUT CONTRAST  Technique:  Multidetector CT imaging of the abdomen and pelvis was performed following the standard protocol without intravenous contrast.  Comparison: CT of the right  hip from the same date.  Findings:  BODY WALL: Unremarkable.  LOWER CHEST:  Mediastinum: Single chamber right ventricular pacer.  Coronary artery atherosclerosis.  Lungs/pleura: Small bilateral pleural effusions and associated atelectasis.  ABDOMEN/PELVIS:  Liver: No focal abnormality.  Biliary: Layering stones.  No evidence of acute cholecystitis.  Pancreas: Fatty atrophy.  Spleen: Unremarkable.  Adrenals: Unremarkable.  Kidneys and ureters: There are two exophytic right renal lesions in the interpolar region.  The largest measures 2.6 cm and is compatible with cyst - confirmed by recent sonography.  The second measures 15 mm and is soft tissue density. This was not definitely seen on prior ultrasound. This lesion may have grown mildly since imaging 102/24/2013.  No hydronephrosis or stone.  Bladder: Mild distension by urine.  Bowel:  No definite communication between the bowel and retroperitoneal collection.  Unfortunately, enteric contrast has not yet reached the colon at time of imaging.   Normal appendix.  Retroperitoneum:Extensive right retroperitoneal hematoma gas and fluid collection, involving the lower posterior pararenal space, the right iliacus muscle, proximal right rectus femoris, iliopsoas bursa.  The largest posterior pararenal component is at the pelvic brim, measuring 9 x 4 by 6 cm.  The psoas muscle is spared.  No evidence of epidural extension of infection.  The neighboring osseous structures show no evidence of osteomyelitis.  Evaluation of the total right hip prosthesis was better accomplished on dedicated CT imaging. There is a relatively large collection posterior to the  right femoral neck and greater trochanter, containing fluid and gas, measuring 7 x 3 x 3 cm. High density material present within the expanded right iliacus, measuring approximately 5 cm in diameter.  Peritoneum: No free fluid or gas.  Reproductive: Prostate enlargement calcifications.  Vascular: Extensive atherosclerotic  calcification.  OSSEOUS: Total right hip prosthesis with surrounding fluid collections, as above.  Extensive degenerative disc disease with multilevel fusion and laminectomy.  No evidence of disc space infection. No suspicious lytic or blastic lesions.  IMPRESSION:  1.  Extensive complex fluid collection, likely abscess, involving the lower right retroperitoneum and right periarticular hip - size and extent described above. Although there is extensive gas within the collection, no bowel communication identified.  Probable hematoma within the iliacus component.  2. Indeterminate, possibly solid 15 mm lesion in the interpolar right kidney.  Followup imaging advised.  3. Cholelithiasis.   Original Report Authenticated By: Tiburcio Pea   Dg Hip Complete Right  08/28/2012   *RADIOLOGY REPORT*  Clinical Data: 79 year old male with right hip pain.  RIGHT HIP - COMPLETE 2+ VIEW  Comparison: 11/26/2010 radiographs  Findings: Right total hip replacement again identified. There is no evidence of fracture, subluxation or dislocation. No complicating features are identified.  IMPRESSION: Right total hip replacement without acute abnormality.   Original Report Authenticated By: Harmon Pier, M.D.   Ct Lumbar Spine Wo Contrast  08/27/2012   *RADIOLOGY REPORT*  Clinical Data: Low back/right leg pain, fever, positive blood cultures  CT LUMBAR SPINE WITHOUT CONTRAST  Technique:  Multidetector CT imaging of the lumbar spine was performed without intravenous contrast administration. Multiplanar CT image reconstructions were also generated.  Comparison: CT myelogram dated 02/10/2012.  Findings: No evidence of fracture or dislocation.  Vertebral body heights are maintained.  Postsurgical changes related to prior T12-L1 posterior laminectomy/discectomy and prior L4-S1 posterior laminectomy.  Prior L3-4 fusion.  Moderate to severe multilevel degenerative changes, unchanged from prior CT myelogram.  This includes mild to moderate  central canal stenosis of the upper lumbar spine and moderate to severe central canal stenosis of the lower lumbar spine with nerve root encroachment at multiple levels.  No evidence of epidural fluid collection/abscess.  Complex fluid collection in the right abdomen/pelvis, incompletely visualized.  IMPRESSION: Postsurgical changes involving the lower thoracolumbar spine.  Stable moderate to severe multilevel degenerative changes, unchanged from prior CT myelogram.  No evidence of epidural fluid collection/abscess.  Complex fluid collection in the right abdomen/pelvis, incompletely visualized.  Please refer to CT right hip report for further assessment.   Original Report Authenticated By: Charline Bills, M.D.   US Abdomen Complete  08/26/2012   *RADIOLOGY REPORT*  Clinical Data:  79 year old with abnormal liver function tests.  COMPLETE ABDOMINAL ULTRASOUND  Comparison:  Unenhanced CT abdomen and pelvis 111/25/202013 [place].  Abdominal ultrasound 05/21/2011 [facility].  Findings:  Gallbladder:  Small shadowing gallstones.  Patient unable to turn into the lateral decubitus position to confirm movement of the stones, but these were present on the prior ultrasound examination. No gallbladder wall thickening or pericholecystic fluid.  Negative sonographic Murphy's sign according to the ultrasound technologist.  Common bile duct:  Normal in caliber with maximum diameter approximating 3 mm.  Liver:  Diffusely increased and coarsened echotexture without focal parenchymal abnormality.  Patent portal vein with hepatopetal flow.  IVC:  Patent in its intrahepatic portion.  Obscured outside the liver by bowel gas.  Pancreas:  Obscured by midline bowel gas and therefore not evaluated.  Spleen:  Normal  size and echotexture without focal parenchymal abnormality.  Right Kidney:  No hydronephrosis.  Well-preserved cortex for age. Cysts arising from the mid and lower pole as noted previously, the cyst arising from the  mid kidney approximating 2.8 cm and the cyst arising from the lower pole approximating 3.1 cm.  No solid renal masses.  Normal parenchymal echotexture.  Approximately 12.0 cm in length.  Left Kidney:  No hydronephrosis.  Well-preserved cortex for age. Normal parenchymal echotexture.  No focal parenchymal abnormality. Approximately 10.7 cm in length.  Abdominal aorta:  Obscured by midline bowel gas and therefore not evaluated.  IMPRESSION:  1.  Cholelithiasis.  No sonographic evidence of acute cholecystitis.  No biliary ductal dilation. 2.  Diffuse hepatic steatosis without focal hepatic parenchymal abnormality. 3.  Midline structures (pancreas, extrahepatic IVC, and abdominal aorta) obscured by overlying bowel gas and therefore not evaluated.   Original Report Authenticated By: Hulan Saas, M.D.   Ct Hip Right Wo Contrast  08/27/2012   *RADIOLOGY REPORT*  Clinical Data: Low back/right leg pain, fever, positive blood cultures  CT OF THE RIGHT HIP WITHOUT CONTRAST  Technique:  Multidetector CT imaging was performed according to the standard protocol. Multiplanar CT image reconstructions were also generated.  Comparison: CT abdomen pelvis dated 110/28/2013.  Findings: Right total hip arthroplasty.  No evidence of fracture or dislocation.  Complex fluid collection/debris involving the right hip (series 5/image 36), right iliopsoas/iliacus muscles (series 5/images 9 and 23), and the visualized right lower retroperitoneum (series 5/image 1).  Representative axial measurements include: --3.8 x 8.3 cm in the right retroperitoneum (series 5/image 5) --9.2 x 5.6 cm in the right iliacus muscle (series 5/image 13) --12.7 x 7.3 cm along the right hip (series 5/image 37)  Based on this study, it is unclear whether this reflects a right hip infection which secondarily involves the lower abdomen/pelvis or whether this originated in the lower abdomen/pelvis and now extends to the right hip.  IMPRESSION: Right total hip  arthroplasty.  Complex fluid collection/debris involving the right hip, right iliopsoas/iliacus muscles, and visualized right lower retroperitoneum, as described above.  Based on this study, it is unclear whether this reflects a right hip infection which secondarily involves the lower abdomen/pelvis (favored) or whether this originated in the lower abdomen/pelvis and now extends to the right hip.  Consider CT abdomen/pelvis (preferably with half dose contrast) for further evaluation.  These results were called by telephone on 08/27/2012 at 2000 hrs to Dr. Tressie Stalker, who verbally acknowledged these results.   Original Report Authenticated By: Charline Bills, M.D.   Dg Chest Port 1 View  08/26/2012   *RADIOLOGY REPORT*  Clinical Data: Elevated white blood count  PORTABLE CHEST - 1 VIEW  Comparison: November 11, 2010.  Findings: Stable cardiomediastinal silhouette.  Left-sided pacemaker is unchanged in position.  No acute pulmonary disease is noted.  No pneumothorax or pleural effusion is noted.  Degenerative joint disease is seen involving both shoulders.  IMPRESSION: No acute cardiopulmonary abnormality seen.   Original Report Authenticated By: Lupita Raider.,  M.D.   Dg Abd Portable 2v  08/26/2012   *RADIOLOGY REPORT*  Clinical Data: Abdominal pain.  PORTABLE ABDOMEN - 2 VIEW  Comparison: [facility] images for CT scan of the lumbar spine dated 02/10/2012  Findings: There is no free air in the abdomen.  There is air scattered throughout nondistended loops of large and small bowel. No excessive stool in the colon.  Diffuse degenerative changes of the lumbar spine.  Right hip  prosthesis.  IMPRESSION: No acute abnormalities.   Original Report Authenticated By: Francene Boyers, M.D.  Results for orders placed during the hospital encounter of 08/25/12  URINE CULTURE      Result Value Range   Specimen Description URINE, CLEAN CATCH     Special Requests NONE     Culture  Setup Time 08/26/2012 21:40     Colony  Count >=100,000 COLONIES/ML     Culture GRAM NEGATIVE RODS     Report Status PENDING    CULTURE, BLOOD (ROUTINE X 2)      Result Value Range   Specimen Description BLOOD LEFT ARM     Special Requests BOTTLES DRAWN AEROBIC AND ANAEROBIC 10CC     Culture  Setup Time 08/26/2012 16:54     Culture       Value: STAPHYLOCOCCUS AUREUS     2 Note: Gram Stain Report Called to,Read Back By and Verified With: KELSEY [place] RN ON8 14 @420  EDMOJ   Report Status PENDING    CULTURE, BLOOD (ROUTINE X 2)      Result Value Range   Specimen Description BLOOD LEFT HAND     Special Requests       Value: BOTTLES DRAWN AEROBIC AND ANAEROBIC AERO 10CC ANA 5CC   Culture  Setup Time 08/26/2012 16:54     Culture       Value: STAPHYLOCOCCUS AUREUS     Note: RIFAMPIN AND GENTAMICIN SHOULD NOT BE USED AS SINGLE DRUGS FOR TREATMENT OF STAPH INFECTIONS.     2 Note: Gram Stain Report Called to,Read Back By and Verified With: KELSEY [place] RN ON8 14 @420  EDMOJ   Report Status PENDING    CBC      Result Value Range   WBC 22.7 (*) 4.0 - 10.5 K/uL   RBC 3.37 (*) 4.22 - 5.81 MIL/uL   Hemoglobin 10.3 (*) 13.0 - 17.0 g/dL   HCT 04.5 (*) 40.9 - 81.1 %   MCV 87.5  78.0 - 100.0 fL   MCH 30.6  26.0 - 34.0 pg   MCHC 34.9  30.0 - 36.0 g/dL   RDW 91.4  78.2 - 95.6 %   Platelets 207  150 - 400 K/uL  BASIC METABOLIC PANEL      Result Value Range   Sodium 131 (*) 135 - 145 mEq/L   Potassium 2.6 (*) 3.5 - 5.1 mEq/L   Chloride 85 (*) 96 - 112 mEq/L   CO2 33 (*) 19 - 32 mEq/L   Glucose, Bld 248 (*) 70 - 99 mg/dL   BUN 69 (*) 6 - 23 mg/dL   Creatinine, Ser 2.13 (*) 0.50 - 1.35 mg/dL   Calcium 8.9  8.4 - 08.6 mg/dL   GFR calc non Af Amer 20 (*) >90 mL/min   GFR calc Af Amer 23 (*) >90 mL/min  PROTIME-INR      Result Value Range   Prothrombin Time 58.6 (*) 11.6 - 15.2 seconds   INR 7.19 (*) 0.00 - 1.49  SEDIMENTATION RATE      Result Value Range   Sed Rate 125 (*) 0 - 16 mm/hr  C-REACTIVE PROTEIN      Result Value  Range   CRP 32.1 (*) <0.60 mg/dL  URINALYSIS, ROUTINE W REFLEX MICROSCOPIC      Result Value Range   Color, Urine YELLOW  YELLOW   APPearance CLOUDY (*) CLEAR   Specific Gravity, Urine 1.014  1.005 - 1.030   pH 5.0  5.0 - 8.0  Glucose, UA NEGATIVE  NEGATIVE mg/dL   Hgb urine dipstick LARGE (*) NEGATIVE   Bilirubin Urine NEGATIVE  NEGATIVE   Ketones, ur NEGATIVE  NEGATIVE mg/dL   Protein, ur NEGATIVE  NEGATIVE mg/dL   Urobilinogen, UA 0.2  0.0 - 1.0 mg/dL   Nitrite NEGATIVE  NEGATIVE   Leukocytes, UA TRACE (*) NEGATIVE  CBC      Result Value Range   WBC 23.7 (*) 4.0 - 10.5 K/uL   RBC 3.41 (*) 4.22 - 5.81 MIL/uL   Hemoglobin 10.3 (*) 13.0 - 17.0 g/dL   HCT 16.1 (*) 09.6 - 04.5 %   MCV 87.1  78.0 - 100.0 fL   MCH 30.2  26.0 - 34.0 pg   MCHC 34.7  30.0 - 36.0 g/dL   RDW 40.9 (*) 81.1 - 91.4 %   Platelets 226  150 - 400 K/uL  URINE MICROSCOPIC-ADD ON      Result Value Range   Squamous Epithelial / LPF RARE  RARE   WBC, UA 7-10  <3 WBC/hpf   RBC / HPF 7-10  <3 RBC/hpf   Bacteria, UA MANY (*) RARE   Casts GRANULAR CAST (*) NEGATIVE  COMPREHENSIVE METABOLIC PANEL      Result Value Range   Sodium 134 (*) 135 - 145 mEq/L   Potassium 2.7 (*) 3.5 - 5.1 mEq/L   Chloride 91 (*) 96 - 112 mEq/L   CO2 33 (*) 19 - 32 mEq/L   Glucose, Bld 139 (*) 70 - 99 mg/dL   BUN 71 (*) 6 - 23 mg/dL   Creatinine, Ser 7.82 (*) 0.50 - 1.35 mg/dL   Calcium 8.6  8.4 - 95.6 mg/dL   Total Protein 6.3  6.0 - 8.3 g/dL   Albumin 1.8 (*) 3.5 - 5.2 g/dL   AST 213 (*) 0 - 37 U/L   ALT 78 (*) 0 - 53 U/L   Alkaline Phosphatase 89  39 - 117 U/L   Total Bilirubin 0.7  0.3 - 1.2 mg/dL   GFR calc non Af Amer 23 (*) >90 mL/min   GFR calc Af Amer 26 (*) >90 mL/min  MAGNESIUM      Result Value Range   Magnesium 2.7 (*) 1.5 - 2.5 mg/dL  PROTIME-INR      Result Value Range   Prothrombin Time 50.7 (*) 11.6 - 15.2 seconds   INR 5.95 (*) 0.00 - 1.49  BASIC METABOLIC PANEL      Result Value Range   Sodium 136  135  - 145 mEq/L   Potassium 2.3 (*) 3.5 - 5.1 mEq/L   Chloride 90 (*) 96 - 112 mEq/L   CO2 34 (*) 19 - 32 mEq/L   Glucose, Bld 146 (*) 70 - 99 mg/dL   BUN 64 (*) 6 - 23 mg/dL   Creatinine, Ser 0.86 (*) 0.50 - 1.35 mg/dL   Calcium 9.0  8.4 - 57.8 mg/dL   GFR calc non Af Amer 29 (*) >90 mL/min   GFR calc Af Amer 33 (*) >90 mL/min  DIFFERENTIAL      Result Value Range   Neutrophils Relative % 92 (*) 43 - 77 %   Neutro Abs 20.9 (*) 1.7 - 7.7 K/uL   Lymphocytes Relative 4 (*) 12 - 46 %   Lymphs Abs 0.8  0.7 - 4.0 K/uL   Monocytes Relative 4  3 - 12 %   Monocytes Absolute 0.9  0.1 - 1.0 K/uL   Eosinophils Relative 0  0 -  5 %   Eosinophils Absolute 0.0  0.0 - 0.7 K/uL   Basophils Relative 0  0 - 1 %   Basophils Absolute 0.0  0.0 - 0.1 K/uL   WBC Morphology TOXIC GRANULATION    GLUCOSE, CAPILLARY      Result Value Range   Glucose-Capillary 144 (*) 70 - 99 mg/dL   Comment 1 Documented in Chart     Comment 2 Notify RN    PROTIME-INR      Result Value Range   Prothrombin Time 16.6 (*) 11.6 - 15.2 seconds   INR 1.38  0.00 - 1.49  CBC WITH DIFFERENTIAL      Result Value Range   WBC 17.0 (*) 4.0 - 10.5 K/uL   RBC 3.25 (*) 4.22 - 5.81 MIL/uL   Hemoglobin 9.9 (*) 13.0 - 17.0 g/dL   HCT 16.1 (*) 09.6 - 04.5 %   MCV 88.3  78.0 - 100.0 fL   MCH 30.5  26.0 - 34.0 pg   MCHC 34.5  30.0 - 36.0 g/dL   RDW 40.9 (*) 81.1 - 91.4 %   Platelets 218  150 - 400 K/uL   Neutrophils Relative % 90 (*) 43 - 77 %   Lymphocytes Relative 4 (*) 12 - 46 %   Monocytes Relative 6  3 - 12 %   Eosinophils Relative 0  0 - 5 %   Basophils Relative 0  0 - 1 %   Neutro Abs 15.3 (*) 1.7 - 7.7 K/uL   Lymphs Abs 0.7  0.7 - 4.0 K/uL   Monocytes Absolute 1.0  0.1 - 1.0 K/uL   Eosinophils Absolute 0.0  0.0 - 0.7 K/uL   Basophils Absolute 0.0  0.0 - 0.1 K/uL   WBC Morphology TOXIC GRANULATION     Smear Review PLATELETS APPEAR ADEQUATE    COMPREHENSIVE METABOLIC PANEL      Result Value Range   Sodium 137  135 - 145  mEq/L   Potassium 2.6 (*) 3.5 - 5.1 mEq/L   Chloride 91 (*) 96 - 112 mEq/L   CO2 34 (*) 19 - 32 mEq/L   Glucose, Bld 153 (*) 70 - 99 mg/dL   BUN 59 (*) 6 - 23 mg/dL   Creatinine, Ser 7.82 (*) 0.50 - 1.35 mg/dL   Calcium 8.9  8.4 - 95.6 mg/dL   Total Protein 6.4  6.0 - 8.3 g/dL   Albumin 2.3 (*) 3.5 - 5.2 g/dL   AST 213 (*) 0 - 37 U/L   ALT 60 (*) 0 - 53 U/L   Alkaline Phosphatase 97  39 - 117 U/L   Total Bilirubin 1.2  0.3 - 1.2 mg/dL   GFR calc non Af Amer 31 (*) >90 mL/min   GFR calc Af Amer 36 (*) >90 mL/min  HEMOGLOBIN A1C      Result Value Range   Hemoglobin A1C 6.7 (*) <5.7 %   Mean Plasma Glucose 146 (*) <117 mg/dL  GLUCOSE, CAPILLARY      Result Value Range   Glucose-Capillary 157 (*) 70 - 99 mg/dL   Comment 1 Documented in Chart     Comment 2 Notify RN    GLUCOSE, CAPILLARY      Result Value Range   Glucose-Capillary 158 (*) 70 - 99 mg/dL   Comment 1 Documented in Chart     Comment 2 Notify RN    GLUCOSE, CAPILLARY      Result Value Range   Glucose-Capillary 157 (*) 70 -  99 mg/dL   Comment 1 Documented in Chart     Comment 2 Notify RN    BASIC METABOLIC PANEL      Result Value Range   Sodium 138  135 - 145 mEq/L   Potassium 3.7  3.5 - 5.1 mEq/L   Chloride 95 (*) 96 - 112 mEq/L   CO2 35 (*) 19 - 32 mEq/L   Glucose, Bld 152 (*) 70 - 99 mg/dL   BUN 54 (*) 6 - 23 mg/dL   Creatinine, Ser 4.09 (*) 0.50 - 1.35 mg/dL   Calcium 8.7  8.4 - 81.1 mg/dL   GFR calc non Af Amer 34 (*) >90 mL/min   GFR calc Af Amer 40 (*) >90 mL/min  GLUCOSE, CAPILLARY      Result Value Range   Glucose-Capillary 137 (*) 70 - 99 mg/dL  PROTIME-INR      Result Value Range   Prothrombin Time 18.5 (*) 11.6 - 15.2 seconds   INR 1.59 (*) 0.00 - 1.49  COMPREHENSIVE METABOLIC PANEL      Result Value Range   Sodium 134 (*) 135 - 145 mEq/L   Potassium 3.0 (*) 3.5 - 5.1 mEq/L   Chloride 93 (*) 96 - 112 mEq/L   CO2 33 (*) 19 - 32 mEq/L   Glucose, Bld 160 (*) 70 - 99 mg/dL   BUN 45 (*) 6 - 23  mg/dL   Creatinine, Ser 9.14 (*) 0.50 - 1.35 mg/dL   Calcium 8.5  8.4 - 78.2 mg/dL   Total Protein 6.3  6.0 - 8.3 g/dL   Albumin 1.9 (*) 3.5 - 5.2 g/dL   AST 82 (*) 0 - 37 U/L   ALT 49  0 - 53 U/L   Alkaline Phosphatase 94  39 - 117 U/L   Total Bilirubin 1.3 (*) 0.3 - 1.2 mg/dL   GFR calc non Af Amer 42 (*) >90 mL/min   GFR calc Af Amer 49 (*) >90 mL/min  CBC      Result Value Range   WBC 14.4 (*) 4.0 - 10.5 K/uL   RBC 3.29 (*) 4.22 - 5.81 MIL/uL   Hemoglobin 9.6 (*) 13.0 - 17.0 g/dL   HCT 95.6 (*) 21.3 - 08.6 %   MCV 89.7  78.0 - 100.0 fL   MCH 29.2  26.0 - 34.0 pg   MCHC 32.5  30.0 - 36.0 g/dL   RDW 57.8 (*) 46.9 - 62.9 %   Platelets 224  150 - 400 K/uL  GLUCOSE, CAPILLARY      Result Value Range   Glucose-Capillary 176 (*) 70 - 99 mg/dL  GLUCOSE, CAPILLARY      Result Value Range   Glucose-Capillary 186 (*) 70 - 99 mg/dL   Comment 1 Notify RN    GLUCOSE, CAPILLARY      Result Value Range   Glucose-Capillary 151 (*) 70 - 99 mg/dL   Comment 1 Documented in Chart     Comment 2 Notify RN    TYPE AND SCREEN      Result Value Range   ABO/RH(D) A POS     Antibody Screen NEG     Sample Expiration 08/29/2012    PREPARE FRESH FROZEN PLASMA      Result Value Range   Unit Number [id number]     Blood Component Type THAWED PLASMA     Unit division 00     Status of Unit ISSUED,FINAL     Transfusion Status OK TO TRANSFUSE  Unit Number [id number]     Blood Component Type THAWED PLASMA     Unit division 00     Status of Unit ISSUED,FINAL     Transfusion Status OK TO TRANSFUSE     Unit Number [id number]     Blood Component Type THAWED PLASMA     Unit division 00     Status of Unit ISSUED,FINAL     Transfusion Status OK TO TRANSFUSE     Unit Number [id number]     Blood Component Type THAWED PLASMA     Unit division 00     Status of Unit ISSUED,FINAL     Transfusion Status OK TO TRANSFUSE     A/P: Pt with hx back/RLE pain/weakness and on coumadin for afib  with recent supratherapeutic PT/INR  and CT demonstrating extensive complex fluid collection /? Hematoma involving rt lower RP/rt periarticular hip regions. Also with staph aureus bacteremia.  Plan is for CT guided aspiration/possible drainage of fluid collection on 8/4. Details/risks of procedure d/w pt/pt's wife with their understanding and consent.

## 2012-08-28 NOTE — Consult Note (Signed)
Reason for Consult:evaluate possible right prosthetic hip infection Referring Physician: Dr. Zeb Comfort [name] is an 79 y.o. male.  HPI: the patient is an 79 year old gentleman who underwent a right total hip replacement with Dr. Turner Daniels in 2012.  He had 1 subsequent dislocation.  In January of this year he had a herniated disc treated by Dr. Yetta Barre with neurosurgery.  He presented initially 7/29 with increasing low back pain and right leg pain.  This was initially felt to be due to possible recurrent herniated disc.  He was admitted to Dr. Yetta Barre service for further workup.  CT scan demonstrated extensive retroperitoneal fluid collection extending down into the hip.  He was found have a super therapeutic INR above 7 on admission.  This has been reversed.  He has been on antibiotics due to positive blood cultures.  Over the last several days his clinical picture is improving and he has no signs of sepsis.  Pain is improving although he still complains of pain in the back extending down the right leg.  Past Medical History  Diagnosis Date  . Atrial fibrillation   . Hypertension   . Pacemaker   . Diabetes mellitus without complication     Borderline  . Arthritis   . Anxiety   . Depression     Past Surgical History  Procedure Laterality Date  . Pacemaker insertion    . Lumbar laminectomy/decompression microdiscectomy  02/14/2012    Procedure: LUMBAR LAMINECTOMY/DECOMPRESSION MICRODISCECTOMY 1 LEVEL;  Surgeon: Tia Alert, MD;  Location: [facility];  Service: Neurosurgery;  Laterality: N/A;  Thoracic twelve - Lumbar one decompressive laminectomy.    No family history on file.  Social History:  reports that he has never smoked. He does not have any smokeless tobacco history on file. He reports that he does not drink alcohol or use illicit drugs.  Allergies: No Known Allergies  Medications: I have reviewed the patient's current medications.  Results for orders placed during the  hospital encounter of 08/25/12 (from the past 48 hour(s))  GLUCOSE, CAPILLARY     Status: Abnormal   Collection Time    08/26/12  4:52 PM      Result Value Range   Glucose-Capillary 144 (*) 70 - 99 mg/dL   Comment 1 Documented in Chart     Comment 2 Notify RN    BASIC METABOLIC PANEL     Status: Abnormal   Collection Time    08/26/12  9:18 PM      Result Value Range   Sodium 136  135 - 145 mEq/L   Potassium 2.3 (*) 3.5 - 5.1 mEq/L   Comment: CRITICAL RESULT CALLED TO, READ BACK BY AND VERIFIED WITH:     C.[place] RN 2229 08/26/12 E.GADDY   Chloride 90 (*) 96 - 112 mEq/L   CO2 34 (*) 19 - 32 mEq/L   Glucose, Bld 146 (*) 70 - 99 mg/dL   BUN 64 (*) 6 - 23 mg/dL   Creatinine, Ser 1.61 (*) 0.50 - 1.35 mg/dL   Calcium 9.0  8.4 - 09.6 mg/dL   GFR calc non Af Amer 29 (*) >90 mL/min   GFR calc Af Amer 33 (*) >90 mL/min   Comment:            The eGFR has been calculated     using the CKD EPI equation.     This calculation has not been     validated in all clinical     situations.  eGFR's persistently     <90 mL/min signify     possible Chronic Kidney Disease.  GLUCOSE, CAPILLARY     Status: Abnormal   Collection Time    08/27/12 12:30 AM      Result Value Range   Glucose-Capillary 157 (*) 70 - 99 mg/dL   Comment 1 Documented in Chart     Comment 2 Notify RN    PROTIME-INR     Status: Abnormal   Collection Time    08/27/12  5:30 AM      Result Value Range   Prothrombin Time 16.6 (*) 11.6 - 15.2 seconds   INR 1.38  0.00 - 1.49  CBC WITH DIFFERENTIAL     Status: Abnormal   Collection Time    08/27/12  5:30 AM      Result Value Range   WBC 17.0 (*) 4.0 - 10.5 K/uL   RBC 3.25 (*) 4.22 - 5.81 MIL/uL   Hemoglobin 9.9 (*) 13.0 - 17.0 g/dL   HCT 40.9 (*) 81.1 - 91.4 %   MCV 88.3  78.0 - 100.0 fL   MCH 30.5  26.0 - 34.0 pg   MCHC 34.5  30.0 - 36.0 g/dL   RDW 78.2 (*) 95.6 - 21.3 %   Platelets 218  150 - 400 K/uL   Neutrophils Relative % 90 (*) 43 - 77 %   Lymphocytes Relative  4 (*) 12 - 46 %   Monocytes Relative 6  3 - 12 %   Eosinophils Relative 0  0 - 5 %   Basophils Relative 0  0 - 1 %   Neutro Abs 15.3 (*) 1.7 - 7.7 K/uL   Lymphs Abs 0.7  0.7 - 4.0 K/uL   Monocytes Absolute 1.0  0.1 - 1.0 K/uL   Eosinophils Absolute 0.0  0.0 - 0.7 K/uL   Basophils Absolute 0.0  0.0 - 0.1 K/uL   WBC Morphology TOXIC GRANULATION     Smear Review PLATELETS APPEAR ADEQUATE    COMPREHENSIVE METABOLIC PANEL     Status: Abnormal   Collection Time    08/27/12  5:30 AM      Result Value Range   Sodium 137  135 - 145 mEq/L   Potassium 2.6 (*) 3.5 - 5.1 mEq/L   Comment: CRITICAL RESULT CALLED TO, READ BACK BY AND VERIFIED WITH:     [place] [id number] AT 0624 BY SHIPMANM   Chloride 91 (*) 96 - 112 mEq/L   CO2 34 (*) 19 - 32 mEq/L   Glucose, Bld 153 (*) 70 - 99 mg/dL   BUN 59 (*) 6 - 23 mg/dL   Creatinine, Ser 4.69 (*) 0.50 - 1.35 mg/dL   Calcium 8.9  8.4 - 62.9 mg/dL   Total Protein 6.4  6.0 - 8.3 g/dL   Albumin 2.3 (*) 3.5 - 5.2 g/dL   AST 528 (*) 0 - 37 U/L   ALT 60 (*) 0 - 53 U/L   Alkaline Phosphatase 97  39 - 117 U/L   Total Bilirubin 1.2  0.3 - 1.2 mg/dL   GFR calc non Af Amer 31 (*) >90 mL/min   GFR calc Af Amer 36 (*) >90 mL/min   Comment:            The eGFR has been calculated     using the CKD EPI equation.     This calculation has not been     validated in all clinical     situations.  eGFR's persistently     <90 mL/min signify     possible Chronic Kidney Disease.  HEMOGLOBIN A1C     Status: Abnormal   Collection Time    08/27/12  5:30 AM      Result Value Range   Hemoglobin A1C 6.7 (*) <5.7 %   Comment: (NOTE)                                                                               According to the ADA Clinical Practice Recommendations for 2011, when     HbA1c is used as a screening test:      >=6.5%   Diagnostic of Diabetes Mellitus               (if abnormal result is confirmed)     5.7-6.4%   Increased risk of developing Diabetes  [place]     References:Diagnosis and Classification of Diabetes Mellitus,Diabetes     Care,2011,34(Suppl 1):S62-S69 and Standards of Medical Care in             Diabetes - 2011,Diabetes Care,2011,34 (Suppl 1):S11-S61.   Mean Plasma Glucose 146 (*) <117 mg/dL  GLUCOSE, CAPILLARY     Status: Abnormal   Collection Time    08/27/12  6:48 AM      Result Value Range   Glucose-Capillary 158 (*) 70 - 99 mg/dL   Comment 1 Documented in Chart     Comment 2 Notify RN    GLUCOSE, CAPILLARY     Status: Abnormal   Collection Time    08/27/12 11:42 AM      Result Value Range   Glucose-Capillary 157 (*) 70 - 99 mg/dL   Comment 1 Documented in Chart     Comment 2 Notify RN    BASIC METABOLIC PANEL     Status: Abnormal   Collection Time    08/27/12  2:35 PM      Result Value Range   Sodium 138  135 - 145 mEq/L   Potassium 3.7  3.5 - 5.1 mEq/L   Chloride 95 (*) 96 - 112 mEq/L   CO2 35 (*) 19 - 32 mEq/L   Glucose, Bld 152 (*) 70 - 99 mg/dL   BUN 54 (*) 6 - 23 mg/dL   Creatinine, Ser 1.61 (*) 0.50 - 1.35 mg/dL   Calcium 8.7  8.4 - 09.6 mg/dL   GFR calc non Af Amer 34 (*) >90 mL/min   GFR calc Af Amer 40 (*) >90 mL/min   Comment:            The eGFR has been calculated     using the CKD EPI equation.     This calculation has not been     validated in all clinical     situations.     eGFR's persistently     <90 mL/min signify     possible Chronic Kidney Disease.  GLUCOSE, CAPILLARY     Status: Abnormal   Collection Time    08/27/12  4:45 PM      Result Value Range   Glucose-Capillary 137 (*) 70 - 99 mg/dL  GLUCOSE, CAPILLARY     Status: Abnormal  Collection Time    08/27/12  9:46 PM      Result Value Range   Glucose-Capillary 176 (*) 70 - 99 mg/dL  PROTIME-INR     Status: Abnormal   Collection Time    08/28/12 12:35 AM      Result Value Range   Prothrombin Time 18.5 (*) 11.6 - 15.2 seconds   INR 1.59 (*) 0.00 - 1.49  COMPREHENSIVE METABOLIC PANEL     Status: Abnormal    Collection Time    08/28/12 12:35 AM      Result Value Range   Sodium 134 (*) 135 - 145 mEq/L   Potassium 3.0 (*) 3.5 - 5.1 mEq/L   Comment: DELTA CHECK NOTED   Chloride 93 (*) 96 - 112 mEq/L   CO2 33 (*) 19 - 32 mEq/L   Glucose, Bld 160 (*) 70 - 99 mg/dL   BUN 45 (*) 6 - 23 mg/dL   Creatinine, Ser 1.61 (*) 0.50 - 1.35 mg/dL   Calcium 8.5  8.4 - 09.6 mg/dL   Total Protein 6.3  6.0 - 8.3 g/dL   Albumin 1.9 (*) 3.5 - 5.2 g/dL   AST 82 (*) 0 - 37 U/L   ALT 49  0 - 53 U/L   Alkaline Phosphatase 94  39 - 117 U/L   Total Bilirubin 1.3 (*) 0.3 - 1.2 mg/dL   GFR calc non Af Amer 42 (*) >90 mL/min   GFR calc Af Amer 49 (*) >90 mL/min   Comment:            The eGFR has been calculated     using the CKD EPI equation.     This calculation has not been     validated in all clinical     situations.     eGFR's persistently     <90 mL/min signify     possible Chronic Kidney Disease.  CBC     Status: Abnormal   Collection Time    08/28/12 12:35 AM      Result Value Range   WBC 14.4 (*) 4.0 - 10.5 K/uL   RBC 3.29 (*) 4.22 - 5.81 MIL/uL   Hemoglobin 9.6 (*) 13.0 - 17.0 g/dL   HCT 04.5 (*) 40.9 - 81.1 %   MCV 89.7  78.0 - 100.0 fL   MCH 29.2  26.0 - 34.0 pg   MCHC 32.5  30.0 - 36.0 g/dL   RDW 91.4 (*) 78.2 - 95.6 %   Platelets 224  150 - 400 K/uL  GLUCOSE, CAPILLARY     Status: Abnormal   Collection Time    08/28/12  6:44 AM      Result Value Range   Glucose-Capillary 186 (*) 70 - 99 mg/dL   Comment 1 Notify RN    GLUCOSE, CAPILLARY     Status: Abnormal   Collection Time    08/28/12 11:43 AM      Result Value Range   Glucose-Capillary 151 (*) 70 - 99 mg/dL   Comment 1 Documented in Chart     Comment 2 Notify RN      Ct Abdomen Pelvis Wo Contrast  08/28/2012   *RADIOLOGY REPORT*  Clinical Data: Evaluate abscess.  CT ABDOMEN AND PELVIS WITHOUT CONTRAST  Technique:  Multidetector CT imaging of the abdomen and pelvis was performed following the standard protocol without intravenous  contrast.  Comparison: CT of the right hip from the same date.  Findings:  BODY WALL: Unremarkable.  LOWER CHEST:  Mediastinum: Single chamber right ventricular pacer.  Coronary artery atherosclerosis.  Lungs/pleura: Small bilateral pleural effusions and associated atelectasis.  ABDOMEN/PELVIS:  Liver: No focal abnormality.  Biliary: Layering stones.  No evidence of acute cholecystitis.  Pancreas: Fatty atrophy.  Spleen: Unremarkable.  Adrenals: Unremarkable.  Kidneys and ureters: There are two exophytic right renal lesions in the interpolar region.  The largest measures 2.6 cm and is compatible with cyst - confirmed by recent sonography.  The second measures 15 mm and is soft tissue density. This was not definitely seen on prior ultrasound. This lesion may have grown mildly since imaging 01/20/2012.  No hydronephrosis or stone.  Bladder: Mild distension by urine.  Bowel:  No definite communication between the bowel and retroperitoneal collection.  Unfortunately, enteric contrast has not yet reached the colon at time of imaging.   Normal appendix.  Retroperitoneum:Extensive right retroperitoneal hematoma gas and fluid collection, involving the lower posterior pararenal space, the right iliacus muscle, proximal right rectus femoris, iliopsoas bursa.  The largest posterior pararenal component is at the pelvic brim, measuring 9 x 4 by 6 cm.  The psoas muscle is spared.  No evidence of epidural extension of infection.  The neighboring osseous structures show no evidence of osteomyelitis.  Evaluation of the total right hip prosthesis was better accomplished on dedicated CT imaging. There is a relatively large collection posterior to the right femoral neck and greater trochanter, containing fluid and gas, measuring 7 x 3 x 3 cm. High density material present within the expanded right iliacus, measuring approximately 5 cm in diameter.  Peritoneum: No free fluid or gas.  Reproductive: Prostate enlargement calcifications.   Vascular: Extensive atherosclerotic calcification.  OSSEOUS: Total right hip prosthesis with surrounding fluid collections, as above.  Extensive degenerative disc disease with multilevel fusion and laminectomy.  No evidence of disc space infection. No suspicious lytic or blastic lesions.  IMPRESSION:  1.  Extensive complex fluid collection, likely abscess, involving the lower right retroperitoneum and right periarticular hip - size and extent described above. Although there is extensive gas within the collection, no bowel communication identified.  Probable hematoma within the iliacus component.  2. Indeterminate, possibly solid 15 mm lesion in the interpolar right kidney.  Followup imaging advised.  3. Cholelithiasis.   Original Report Authenticated By: Tiburcio Pea   Dg Hip Complete Right  08/28/2012   *RADIOLOGY REPORT*  Clinical Data: 79 year old male with right hip pain.  RIGHT HIP - COMPLETE 2+ VIEW  Comparison: 11/26/2010 radiographs  Findings: Right total hip replacement again identified. There is no evidence of fracture, subluxation or dislocation. No complicating features are identified.  IMPRESSION: Right total hip replacement without acute abnormality.   Original Report Authenticated By: Harmon Pier, M.D.   Ct Lumbar Spine Wo Contrast  08/27/2012   *RADIOLOGY REPORT*  Clinical Data: Low back/right leg pain, fever, positive blood cultures  CT LUMBAR SPINE WITHOUT CONTRAST  Technique:  Multidetector CT imaging of the lumbar spine was performed without intravenous contrast administration. Multiplanar CT image reconstructions were also generated.  Comparison: CT myelogram dated 02/10/2012.  Findings: No evidence of fracture or dislocation.  Vertebral body heights are maintained.  Postsurgical changes related to prior T12-L1 posterior laminectomy/discectomy and prior L4-S1 posterior laminectomy.  Prior L3-4 fusion.  Moderate to severe multilevel degenerative changes, unchanged from prior CT myelogram.   This includes mild to moderate central canal stenosis of the upper lumbar spine and moderate to severe central canal stenosis of the lower lumbar spine with nerve root  encroachment at multiple levels.  No evidence of epidural fluid collection/abscess.  Complex fluid collection in the right abdomen/pelvis, incompletely visualized.  IMPRESSION: Postsurgical changes involving the lower thoracolumbar spine.  Stable moderate to severe multilevel degenerative changes, unchanged from prior CT myelogram.  No evidence of epidural fluid collection/abscess.  Complex fluid collection in the right abdomen/pelvis, incompletely visualized.  Please refer to CT right hip report for further assessment.   Original Report Authenticated By: Charline Bills, M.D.   US Abdomen Complete  08/26/2012   *RADIOLOGY REPORT*  Clinical Data:  79 year old with abnormal liver function tests.  COMPLETE ABDOMINAL ULTRASOUND  Comparison:  Unenhanced CT abdomen and pelvis 12020/10/211 [place].  Abdominal ultrasound 05/21/2011 [facility].  Findings:  Gallbladder:  Small shadowing gallstones.  Patient unable to turn into the lateral decubitus position to confirm movement of the stones, but these were present on the prior ultrasound examination. No gallbladder wall thickening or pericholecystic fluid.  Negative sonographic Murphy's sign according to the ultrasound technologist.  Common bile duct:  Normal in caliber with maximum diameter approximating 3 mm.  Liver:  Diffusely increased and coarsened echotexture without focal parenchymal abnormality.  Patent portal vein with hepatopetal flow.  IVC:  Patent in its intrahepatic portion.  Obscured outside the liver by bowel gas.  Pancreas:  Obscured by midline bowel gas and therefore not evaluated.  Spleen:  Normal size and echotexture without focal parenchymal abnormality.  Right Kidney:  No hydronephrosis.  Well-preserved cortex for age. Cysts arising from the mid and lower pole as noted  previously, the cyst arising from the mid kidney approximating 2.8 cm and the cyst arising from the lower pole approximating 3.1 cm.  No solid renal masses.  Normal parenchymal echotexture.  Approximately 12.0 cm in length.  Left Kidney:  No hydronephrosis.  Well-preserved cortex for age. Normal parenchymal echotexture.  No focal parenchymal abnormality. Approximately 10.7 cm in length.  Abdominal aorta:  Obscured by midline bowel gas and therefore not evaluated.  IMPRESSION:  1.  Cholelithiasis.  No sonographic evidence of acute cholecystitis.  No biliary ductal dilation. 2.  Diffuse hepatic steatosis without focal hepatic parenchymal abnormality. 3.  Midline structures (pancreas, extrahepatic IVC, and abdominal aorta) obscured by overlying bowel gas and therefore not evaluated.   Original Report Authenticated By: Hulan Saas, M.D.   Ct Hip Right Wo Contrast  08/27/2012   *RADIOLOGY REPORT*  Clinical Data: Low back/right leg pain, fever, positive blood cultures  CT OF THE RIGHT HIP WITHOUT CONTRAST  Technique:  Multidetector CT imaging was performed according to the standard protocol. Multiplanar CT image reconstructions were also generated.  Comparison: CT abdomen pelvis dated 12020/10/211.  Findings: Right total hip arthroplasty.  No evidence of fracture or dislocation.  Complex fluid collection/debris involving the right hip (series 5/image 36), right iliopsoas/iliacus muscles (series 5/images 9 and 23), and the visualized right lower retroperitoneum (series 5/image 1).  Representative axial measurements include: --3.8 x 8.3 cm in the right retroperitoneum (series 5/image 5) --9.2 x 5.6 cm in the right iliacus muscle (series 5/image 13) --12.7 x 7.3 cm along the right hip (series 5/image 37)  Based on this study, it is unclear whether this reflects a right hip infection which secondarily involves the lower abdomen/pelvis or whether this originated in the lower abdomen/pelvis and now extends to the right hip.   IMPRESSION: Right total hip arthroplasty.  Complex fluid collection/debris involving the right hip, right iliopsoas/iliacus muscles, and visualized right lower retroperitoneum, as described above.  Based on  this study, it is unclear whether this reflects a right hip infection which secondarily involves the lower abdomen/pelvis (favored) or whether this originated in the lower abdomen/pelvis and now extends to the right hip.  Consider CT abdomen/pelvis (preferably with half dose contrast) for further evaluation.  These results were called by telephone on 08/27/2012 at 2000 hrs to Dr. Tressie Stalker, who verbally acknowledged these results.   Original Report Authenticated By: Charline Bills, M.D.   Dg Abd Portable 2v  08/26/2012   *RADIOLOGY REPORT*  Clinical Data: Abdominal pain.  PORTABLE ABDOMEN - 2 VIEW  Comparison: [facility] images for CT scan of the lumbar spine dated 02/10/2012  Findings: There is no free air in the abdomen.  There is air scattered throughout nondistended loops of large and small bowel. No excessive stool in the colon.  Diffuse degenerative changes of the lumbar spine.  Right hip prosthesis.  IMPRESSION: No acute abnormalities.   Original Report Authenticated By: Francene Boyers, M.D.    Review of Systems  Constitutional: Positive for chills.  All other systems reviewed and are negative.   Blood pressure 118/53, pulse 84, temperature 99.2 F (37.3 C), temperature source Oral, resp. rate 20, height 5' 11.5" (1.816 m), weight 110.723 kg (244 lb 1.6 oz), SpO2 93.00%. Physical Exam  Constitutional: He is oriented to person, place, and time. He appears well-developed and well-nourished.  HENT:  Head: Atraumatic.  Eyes: EOM are normal.  Cardiovascular: Intact distal pulses.   Respiratory: Effort normal.  Musculoskeletal:  Examination of the right hip shows his previous incision to be well-healed.  There is no erythema.  Mild warmth.  Mild tenderness to deep palpation.  There is some  discomfort with internal and external rotation of the hip and distally.  Distally he is neurovascularly intact.  No knee effusion.  Neurological: He is alert and oriented to person, place, and time.  Skin: Skin is warm and dry.  Psychiatric: He has a normal mood and affect.    Assessment/Plan: He appears to have an infected retroperitoneal hematoma which is extending down into his prosthetic right hip replacement He is scheduled to have interventional radiology try and drain the abscess tomorrow. I have spoken with Dr. Turner Daniels about this patient.  He will likely need formal irrigation and debridement of the hip.  Dr. Turner Daniels will be discussing this with the patient and will likely take him for surgery tomorrow if possible. Hold Coumadin. For now he is is not septic and no urgent surgery is necessary.  Recommend continuing current antibiotic regimen.  Mable Paris 08/28/2012, 2:04 PM

## 2012-08-28 NOTE — Consult Note (Signed)
  Problem: Patient was seen by me on 08/24/2012, with right low back pain, and buttock pain radiating down the right leg all the way to the foot.  He also had weakness to straight leg raising. No fevers, chills, or night sweats.  I had last seen him in January 2014 when he had radicular symptoms from a large herniated disc that subsequentlyunderwent successful decompression by Dr. Marikay Alar of neurosurgery. The patient did well until he had the spontaneous onset of pain a few days prior to 08/24/2012.The patient was admitted to the hospital on 08/25/2012 by Dr. Yetta Barre for workup of potential recurrent disc, however, CT scan showed a large retroperitoneal collection of fluid that tracked down the psoas tendon with fluid in the hip joint as well.  On admission, the patient was noted to have an INR of 7, moderate renal failure, as well as a leukocytosis.  Blood cultures have grown out staph aureus.  Based on these findings, there is significant concern for infection in the right total hip originating from a retroperitoneal hematoma that has become infected.  The patient was seen by my partner, Dr. Jackquline Bosch today, who contacted me on the phone and relayed these findings, as I am not in town.  The patient's INR today was 1.59, after reversal on admission. His white count which was 22,000 admission is now down to 14,000, and he has been receiving vancomycin IV.  I was able to contact the patient's wife by phone and discussed these findings with her and her husband.  Assessment: probable infected right total hip as a result of a retroperitoneal hematoma that became infected with staph aureus and tracked down the psoas tendon.  The retroperitoneal hematoma was probably secondary to an elevated INR of 7 on admission.  Plan: I have placed the patient n.p.o. And scheduled him for irrigation and debridement of his right total hip tomorrow sometime in the late morning or early afternoon.  Early in the morning.  I will  actually see the patient and probably attempt to aspirate fluid from the hip using a spinal needle over the greater trochanter.  Unless the fluid that is withdrawn is clear.  The conservative course of treatment would be a thorough irrigation and debridement, polyethylene liner and femoral head exchange, and placement of large bore drains.  The patient and his wife are aware of the risks and benefits of surgery, as well as the risks of not draining the abscess.

## 2012-08-28 NOTE — Progress Notes (Signed)
Pt states he is hungry and is ready to eat real food, no more liquid. Pt asked for cereal, pt tolerated well. Placed heart healthy diet order. Will continue to monitor.

## 2012-08-29 ENCOUNTER — Inpatient Hospital Stay (HOSPITAL_COMMUNITY): Payer: Medicare Other

## 2012-08-29 ENCOUNTER — Encounter (HOSPITAL_COMMUNITY)
Admission: AD | Disposition: A | Discharge status: Skilled Nursing Facility | Payer: Self-pay | Source: Ambulatory Visit | Attending: Internal Medicine

## 2012-08-29 DIAGNOSIS — R82998 Other abnormal findings in urine: Secondary | ICD-10-CM

## 2012-08-29 DIAGNOSIS — M25559 Pain in unspecified hip: Secondary | ICD-10-CM

## 2012-08-29 DIAGNOSIS — A4901 Methicillin susceptible Staphylococcus aureus infection, unspecified site: Secondary | ICD-10-CM

## 2012-08-29 LAB — CBC WITH DIFFERENTIAL/PLATELET
Basophils Absolute: 0 10*3/uL (ref 0.0–0.1)
Basophils Relative: 0 % (ref 0–1)
Eosinophils Absolute: 0 10*3/uL (ref 0.0–0.7)
Eosinophils Relative: 0 % (ref 0–5)
HCT: 30.9 % — ABNORMAL LOW (ref 39.0–52.0)
Hemoglobin: 10.2 g/dL — ABNORMAL LOW (ref 13.0–17.0)
Lymphocytes Relative: 7 % — ABNORMAL LOW (ref 12–46)
Lymphs Abs: 1.1 10*3/uL (ref 0.7–4.0)
MCH: 29.8 pg (ref 26.0–34.0)
MCHC: 33 g/dL (ref 30.0–36.0)
MCV: 90.4 fL (ref 78.0–100.0)
Monocytes Absolute: 0.9 10*3/uL (ref 0.1–1.0)
Monocytes Relative: 6 % (ref 3–12)
Neutro Abs: 13.4 10*3/uL — ABNORMAL HIGH (ref 1.7–7.7)
Neutrophils Relative %: 87 % — ABNORMAL HIGH (ref 43–77)
Platelets: 246 10*3/uL (ref 150–400)
RBC: 3.42 MIL/uL — ABNORMAL LOW (ref 4.22–5.81)
RDW: 16.1 % — ABNORMAL HIGH (ref 11.5–15.5)
WBC: 15.4 10*3/uL — ABNORMAL HIGH (ref 4.0–10.5)

## 2012-08-29 LAB — BASIC METABOLIC PANEL
BUN: 31 mg/dL — ABNORMAL HIGH (ref 6–23)
CO2: 34 mEq/L — ABNORMAL HIGH (ref 19–32)
Calcium: 8.7 mg/dL (ref 8.4–10.5)
Chloride: 93 mEq/L — ABNORMAL LOW (ref 96–112)
Creatinine, Ser: 1.43 mg/dL — ABNORMAL HIGH (ref 0.50–1.35)
GFR calc Af Amer: 52 mL/min — ABNORMAL LOW (ref >90)
GFR calc non Af Amer: 45 mL/min — ABNORMAL LOW (ref >90)
Glucose, Bld: 141 mg/dL — ABNORMAL HIGH (ref 70–99)
Potassium: 3.6 mEq/L (ref 3.5–5.1)
Sodium: 137 mEq/L (ref 135–145)

## 2012-08-29 LAB — URINE CULTURE

## 2012-08-29 LAB — GLUCOSE, CAPILLARY
Glucose-Capillary: 173 mg/dL — ABNORMAL HIGH (ref 70–99)
Glucose-Capillary: 137 mg/dL — ABNORMAL HIGH (ref 70–99)
Glucose-Capillary: 130 mg/dL — ABNORMAL HIGH (ref 70–99)

## 2012-08-29 LAB — CULTURE, BLOOD (ROUTINE X 2)

## 2012-08-29 LAB — PROTIME-INR
INR: 2.32 — ABNORMAL HIGH (ref 0.00–1.49)
Prothrombin Time: 24.7 seconds — ABNORMAL HIGH (ref 11.6–15.2)

## 2012-08-29 SURGERY — ARTHROPLASTY, HIP, TOTAL,POSTERIOR APPROACH
Anesthesia: General | Laterality: Right

## 2012-08-29 MED ORDER — FENTANYL CITRATE 0.05 MG/ML IJ SOLN
INTRAMUSCULAR | Status: AC | PRN
  Administered 2012-08-29 (×2): 25 ug via INTRAVENOUS

## 2012-08-29 MED ORDER — MIDAZOLAM HCL 2 MG/2ML IJ SOLN
INTRAMUSCULAR | Status: AC | PRN
  Administered 2012-08-29: 2 mg via INTRAVENOUS

## 2012-08-29 MED ORDER — MIDAZOLAM HCL 2 MG/2ML IJ SOLN
INTRAMUSCULAR | Status: AC
  Filled 2012-08-29: qty 4

## 2012-08-29 MED ORDER — FENTANYL CITRATE 0.05 MG/ML IJ SOLN
INTRAMUSCULAR | Status: AC
  Filled 2012-08-29: qty 4

## 2012-08-29 MED ORDER — SODIUM CHLORIDE 0.9 % IV SOLN: INTRAVENOUS | Status: DC

## 2012-08-29 NOTE — Progress Notes (Signed)
Subjective: Less ab pain, no real complaints  Objective: Vital signs in last 24 hours: Temp:  [98.2 F (36.8 C)-100.2 F (37.9 C)] 99.3 F (37.4 C) (08/04 0600) Pulse Rate:  [84-122] 103 (08/04 0600) Resp:  [18-20] 18 (08/04 0600) BP: (103-142)/(47-75) 132/75 mmHg (08/04 0600) SpO2:  [93 %-97 %] 95 % (08/04 0600) Last BM Date: 08/28/12  Intake/Output from previous day: 08/03 0701 - 08/04 0700 In: 800 [P.O.:800] Out: 2700 [Urine:2700] Intake/Output this shift:    GI: soft ,mild right sided tenderness  Lab Results:   Recent Labs  08/28/12 0035 08/29/12 0540  WBC 14.4* 15.4*  HGB 9.6* 10.2*  HCT 29.5* 30.9*  PLT 224 246   BMET  Recent Labs  08/28/12 0035 08/29/12 0540  NA 134* 137  K 3.0* 3.6  CL 93* 93*  CO2 33* 34*  GLUCOSE 160* 141*  BUN 45* 31*  CREATININE 1.51* 1.43*  CALCIUM 8.5 8.7   PT/INR  Recent Labs  08/28/12 0035 08/29/12 0540  LABPROT 18.5* 24.7*  INR 1.59* 2.32*   ABG No results found for this basename: PHART, PCO2, PO2, HCO3,  in the last 72 hours  Studies/Results: Ct Abdomen Pelvis Wo Contrast  08/28/2012   *RADIOLOGY REPORT*  Clinical Data: Evaluate abscess.  CT ABDOMEN AND PELVIS WITHOUT CONTRAST  Technique:  Multidetector CT imaging of the abdomen and pelvis was performed following the standard protocol without intravenous contrast.  Comparison: CT of the right hip from the same date.  Findings:  BODY WALL: Unremarkable.  LOWER CHEST:  Mediastinum: Single chamber right ventricular pacer.  Coronary artery atherosclerosis.  Lungs/pleura: Small bilateral pleural effusions and associated atelectasis.  ABDOMEN/PELVIS:  Liver: No focal abnormality.  Biliary: Layering stones.  No evidence of acute cholecystitis.  Pancreas: Fatty atrophy.  Spleen: Unremarkable.  Adrenals: Unremarkable.  Kidneys and ureters: There are two exophytic right renal lesions in the interpolar region.  The largest measures 2.6 cm and is compatible with cyst -  confirmed by recent sonography.  The second measures 15 mm and is soft tissue density. This was not definitely seen on prior ultrasound. This lesion may have grown mildly since imaging 01/20/2012.  No hydronephrosis or stone.  Bladder: Mild distension by urine.  Bowel:  No definite communication between the bowel and retroperitoneal collection.  Unfortunately, enteric contrast has not yet reached the colon at time of imaging.   Normal appendix.  Retroperitoneum:Extensive right retroperitoneal hematoma gas and fluid collection, involving the lower posterior pararenal space, the right iliacus muscle, proximal right rectus femoris, iliopsoas bursa.  The largest posterior pararenal component is at the pelvic brim, measuring 9 x 4 by 6 cm.  The psoas muscle is spared.  No evidence of epidural extension of infection.  The neighboring osseous structures show no evidence of osteomyelitis.  Evaluation of the total right hip prosthesis was better accomplished on dedicated CT imaging. There is a relatively large collection posterior to the right femoral neck and greater trochanter, containing fluid and gas, measuring 7 x 3 x 3 cm. High density material present within the expanded right iliacus, measuring approximately 5 cm in diameter.  Peritoneum: No free fluid or gas.  Reproductive: Prostate enlargement calcifications.  Vascular: Extensive atherosclerotic calcification.  OSSEOUS: Total right hip prosthesis with surrounding fluid collections, as above.  Extensive degenerative disc disease with multilevel fusion and laminectomy.  No evidence of disc space infection. No suspicious lytic or blastic lesions.  IMPRESSION:  1.  Extensive complex fluid collection, likely abscess, involving the  lower right retroperitoneum and right periarticular hip - size and extent described above. Although there is extensive gas within the collection, no bowel communication identified.  Probable hematoma within the iliacus component.  2.  Indeterminate, possibly solid 15 mm lesion in the interpolar right kidney.  Followup imaging advised.  3. Cholelithiasis.   Original Report Authenticated By: Tiburcio Pea   Dg Hip Complete Right  08/28/2012   *RADIOLOGY REPORT*  Clinical Data: 79 year old male with right hip pain.  RIGHT HIP - COMPLETE 2+ VIEW  Comparison: 11/26/2010 radiographs  Findings: Right total hip replacement again identified. There is no evidence of fracture, subluxation or dislocation. No complicating features are identified.  IMPRESSION: Right total hip replacement without acute abnormality.   Original Report Authenticated By: Harmon Pier, M.D.   Ct Lumbar Spine Wo Contrast  08/27/2012   *RADIOLOGY REPORT*  Clinical Data: Low back/right leg pain, fever, positive blood cultures  CT LUMBAR SPINE WITHOUT CONTRAST  Technique:  Multidetector CT imaging of the lumbar spine was performed without intravenous contrast administration. Multiplanar CT image reconstructions were also generated.  Comparison: CT myelogram dated 02/10/2012.  Findings: No evidence of fracture or dislocation.  Vertebral body heights are maintained.  Postsurgical changes related to prior T12-L1 posterior laminectomy/discectomy and prior L4-S1 posterior laminectomy.  Prior L3-4 fusion.  Moderate to severe multilevel degenerative changes, unchanged from prior CT myelogram.  This includes mild to moderate central canal stenosis of the upper lumbar spine and moderate to severe central canal stenosis of the lower lumbar spine with nerve root encroachment at multiple levels.  No evidence of epidural fluid collection/abscess.  Complex fluid collection in the right abdomen/pelvis, incompletely visualized.  IMPRESSION: Postsurgical changes involving the lower thoracolumbar spine.  Stable moderate to severe multilevel degenerative changes, unchanged from prior CT myelogram.  No evidence of epidural fluid collection/abscess.  Complex fluid collection in the right abdomen/pelvis,  incompletely visualized.  Please refer to CT right hip report for further assessment.   Original Report Authenticated By: Charline Bills, M.D.   Ct Hip Right Wo Contrast  08/27/2012   *RADIOLOGY REPORT*  Clinical Data: Low back/right leg pain, fever, positive blood cultures  CT OF THE RIGHT HIP WITHOUT CONTRAST  Technique:  Multidetector CT imaging was performed according to the standard protocol. Multiplanar CT image reconstructions were also generated.  Comparison: CT abdomen pelvis dated 01/20/2012.  Findings: Right total hip arthroplasty.  No evidence of fracture or dislocation.  Complex fluid collection/debris involving the right hip (series 5/image 36), right iliopsoas/iliacus muscles (series 5/images 9 and 23), and the visualized right lower retroperitoneum (series 5/image 1).  Representative axial measurements include: --3.8 x 8.3 cm in the right retroperitoneum (series 5/image 5) --9.2 x 5.6 cm in the right iliacus muscle (series 5/image 13) --12.7 x 7.3 cm along the right hip (series 5/image 37)  Based on this study, it is unclear whether this reflects a right hip infection which secondarily involves the lower abdomen/pelvis or whether this originated in the lower abdomen/pelvis and now extends to the right hip.  IMPRESSION: Right total hip arthroplasty.  Complex fluid collection/debris involving the right hip, right iliopsoas/iliacus muscles, and visualized right lower retroperitoneum, as described above.  Based on this study, it is unclear whether this reflects a right hip infection which secondarily involves the lower abdomen/pelvis (favored) or whether this originated in the lower abdomen/pelvis and now extends to the right hip.  Consider CT abdomen/pelvis (preferably with half dose contrast) for further evaluation.  These results were called  by telephone on 08/27/2012 at 2000 hrs to Dr. Tressie Stalker, who verbally acknowledged these results.   Original Report Authenticated By: Charline Bills,  M.D.    Anti-infectives: Anti-infectives   Start     Dose/Rate Route Frequency Ordered Stop   08/28/12 1400  ceFAZolin (ANCEF) IVPB 1 g/50 mL premix     1 g 100 mL/hr over 30 Minutes Intravenous 3 times per day 08/28/12 1136     08/28/12 0400  vancomycin (VANCOCIN) 1,500 mg in sodium chloride 0.9 % 500 mL IVPB     1,500 mg 250 mL/hr over 120 Minutes Intravenous Every 24 hours 08/27/12 0431     08/27/12 0445  vancomycin (VANCOCIN) 2,000 mg in sodium chloride 0.9 % 500 mL IVPB     2,000 mg 250 mL/hr over 120 Minutes Intravenous  Once 08/27/12 0431 08/27/12 0722   08/26/12 1000  cefTRIAXone (ROCEPHIN) 1 g in dextrose 5 % 50 mL IVPB  Status:  Discontinued     1 g 100 mL/hr over 30 Minutes Intravenous Every 24 hours 08/26/12 0913 08/27/12 1610      Assessment/Plan: RP hematoma, likely infected  I think conservative approach best as planned to start.  Will have ir drainage, cont abx and will follow  [facility] 08/29/2012

## 2012-08-29 NOTE — Progress Notes (Signed)
Patient ID: [name], male   DOB: Dec 03, 1930, 79 y.o.   MRN: [id number]  S: After reviewing CT scan and discussing options with the patient and his wife aspiration of right total hip was attempted this morning using an 18-gauge by 3.5 inch needle. This was accomplished with the patient in the left lateral decubitus position using ethyl chloride as an anesthetic.  O: Prior to the attempted aspiration the patient had very little pain with log roll and had a negative foot tap. The hip itself had no erythema or swelling about the wound, but he continued to have significant so as weakness and pain with attempted straight leg raise.. The skin over the greater trochanter was sterilely prepped with the patient in the left lateral decubitus position and anesthetized with ethyl chloride. Entering just above the greater trochanter and going perpendicular to the lateral aspect of the thigh the needle was taken down to the hip joint itself and one could feel the tip of the needle touching the metal of the implant itself on 2 of the 4 passes. Aspiration was attempted on the way in and away out yielding only one or 2 drops of normal-appearing blood.  A: Elevated INR of 7 on admission, with retroperitoneal hematoma that probably was seeded with staph aureus based on blood cultures. CT scan is suspicious for fluid tracking down the psoas tendon into the joint, however on 4 passes 2 of which actually touch the metal of the hip implant I was unable to get any fluid or pus out.  P: The patient is scheduled for interventional radiology with placement of pigtail catheter into the retroperitoneal abscess today and they should proceed. With the attempted aspirate being dry, I will cancel his hip surgery for now and monitor the results of the percutaneous drainage of the retroperitoneal space. Will follow.

## 2012-08-29 NOTE — Progress Notes (Signed)
[facility] PROGRESS NOTE  [name] MRN:[id number] DOB: 06/14/1930 DOA: 08/25/2012 PCP: JOBE,DANIEL B., MD  Assessment/Plan: #1 MSSA bacteremia Questionable etiology. Preliminary blood cultures were 2 out of 2 gram-positive cocci in clusters. CT of the hip abdomen and pelvis concerning for complex fluid collection of the left hip and possible retroperitoneal hematoma. Continue empiric IV vancomycin and cefazolin as per ID recommendations. Repeat blood cultures pending. ID recommending TEE to r/o endocarditis. ID following and appreciate input and recommendations.  #2 Klebsiella ozaenae UTI Continue IV cefazolin.  #3 Large retroperitoneal complex fluid collection and complex fluid collection of the right hip abscess vs hematoma Patient noted to have an INR as high as 7.19 on day of admission. INR has been reversed. CT of the hip and CT of the abdomen and pelvis worrisome for complex fluid collection around the right hip and also retroperitoneal hematoma. Gen. surgery and orthopedics have been consulted. Interventional radiology for aspiration and possible drainage today. If aspirated will likely need to send for Gram stain and culture. Continue empiric IV antibiotics. Continue supportive care. Warm compresses. Follow.   #4 right lower extremity weakness and back pain Likely secondary to retroperitoneal hematoma and possible complex fluid collection noted around the right hip. Orthopedics has been consulted as general surgery. Per neurosurgery.  #5 acute renal failure/dehydration/hypokalemia Acute renal failure secondary to prerenal azotemia. Renal function improved. Normal saline lock IV fluids. Patient also noted to be in Zaroxolyn which is the likely etiology of this is hypokalemia. Replete potassium. Will place on daily potassium.  #6 supratherapeutic INR Status post vitamin K doses x2. Status post FFP. INR is now subtherapeutic but trending back up. Patient likely has a  retroperitoneal hematoma and a such will hold off on anticoagulations for now. Repeat INR in the morning. Follow.  #7 leukocytosis Likely secondary to problems #1 and 2. Urine cultures with greater than 100,000 of Klebsiella ozaenae. Blood cultures with MSSA . Continue empiric IV vancomycin and cefazolin per ID recommendations. ID following and appreciate input and recommendations.  #8 right-sided abdominal tenderness Clinical improvement. Likely secondary to retroperitoneal hematoma. Abdominal ultrasound and abdominal films negative for any acute abnormalities. Abdominal ultrasound positive for hepatic steatosis. Follow. Pain management. Supportive care.  #9 atrial fibrillation Continue beta blocker for rate control. INR has been reversed, but trending back up. Patient is status post permanent pacemaker.  Follow.  #10 normocytic anemia Stable. Follow H&H.  #11 type 2 diabetes Continue sliding scale insulin.  Code Status: Full Family Communication: Updated patient at bedside. Disposition Plan: Per primary team.   Consultants:   infectious diseases: Dr., 08/27/2012  General surgery: Dr. Cornett 08/28/2012  Orthopedic surgery: Dr. Chandler 08/28/2012   Procedures:  Acute abdominal series 08/26/2012  Chest x-ray 08/26/2012  Antibiotics:  IV vancomycin 08/26/2012  IV Rocephin 08/26/2012---. 08/27/12  IV Ancef  08/28/2012   HPI/Subjective: Patient states right lower extremity weakness and numbness unchanged. No complaints.  Objective: Filed Vitals:   08/28/12 1755 08/28/12 2200 08/29/12 0200 08/29/12 0600  BP: 120/71 103/47 142/69 132/75  Pulse: 98 92 122 103  Temp: 98.2 F (36.8 C) 98.7 F (37.1 C) 99.9 F (37.7 C) 99.3 F (37.4 C)  TempSrc: Oral     Resp: 20 18 18 18  Height:      Weight:      SpO2: 95% 97% 96% 95%    Intake/Output Summary (Last 24 hours) at 08/29/12 0950 Last data filed at 08/29/12 0700  Gross per 24 hour  Intake      440 ml  Output    2700 ml  Net  -2260 ml   Filed Weights   08/25/12 2128  Weight: 110.723 kg (244 lb 1.6 oz)    Exam:   General:  NAD  Cardiovascular: RRR  Respiratory: CTAB  Abdomen: SOFT/NT/ND/+BS  EXTREMITIES: Right hip tender to palpation. 0-5 right lower extremity strength. 4/5 left lower extremity shin. 5 out of 5 bilateral upper extremity strength.  Data Reviewed: Basic Metabolic Panel:  Recent Labs Lab 08/26/12 1120 08/26/12 2118 08/27/12 0530 08/27/12 1435 08/28/12 0035 08/29/12 0540  NA 134* 136 137 138 134* 137  K 2.7* 2.3* 2.6* 3.7 3.0* 3.6  CL 91* 90* 91* 95* 93* 93*  CO2 33* 34* 34* 35* 33* 34*  GLUCOSE 139* 146* 153* 152* 160* 141*  BUN 71* 64* 59* 54* 45* 31*  CREATININE 2.51* 2.07* 1.95* 1.78* 1.51* 1.43*  CALCIUM 8.6 9.0 8.9 8.7 8.5 8.7  MG 2.7*  --   --   --   --   --    Liver Function Tests:  Recent Labs Lab 08/26/12 1120 08/27/12 0530 08/28/12 0035  AST 192* 117* 82*  ALT 78* 60* 49  ALKPHOS 89 97 94  BILITOT 0.7 1.2 1.3*  PROT 6.3 6.4 6.3  ALBUMIN 1.8* 2.3* 1.9*   No results found for this basename: LIPASE, AMYLASE,  in the last 168 hours No results found for this basename: AMMONIA,  in the last 168 hours CBC:  Recent Labs Lab 08/25/12 2108 08/26/12 0748 08/26/12 0751 08/27/12 0530 08/28/12 0035 08/29/12 0540  WBC 22.7*  --  23.7* 17.0* 14.4* 15.4*  NEUTROABS  --  20.9*  --  15.3*  --  13.4*  HGB 10.3*  --  10.3* 9.9* 9.6* 10.2*  HCT 29.5*  --  29.7* 28.7* 29.5* 30.9*  MCV 87.5  --  87.1 88.3 89.7 90.4  PLT 207  --  226 218 224 246   Cardiac Enzymes: No results found for this basename: CKTOTAL, CKMB, CKMBINDEX, TROPONINI,  in the last 168 hours BNP (last 3 results) No results found for this basename: PROBNP,  in the last 8760 hours CBG:  Recent Labs Lab 08/28/12 0644 08/28/12 1143 08/28/12 1636 08/28/12 2137 08/29/12 0655  GLUCAP 186* 151* 163* 173* 134*    Recent Results (from the past 240 hour(s))  URINE CULTURE      Status: None   Collection Time    08/26/12  7:32 AM      Result Value Range Status   Specimen Description URINE, CLEAN CATCH   Final   Special Requests NONE   Final   Culture  Setup Time 08/26/2012 21:40   Final   Colony Count >=100,000 COLONIES/ML   Final   Culture GRAM NEGATIVE RODS   Final   Report Status PENDING   Incomplete  CULTURE, BLOOD (ROUTINE X 2)     Status: None   Collection Time    08/26/12 11:20 AM      Result Value Range Status   Specimen Description BLOOD LEFT ARM   Final   Special Requests BOTTLES DRAWN AEROBIC AND ANAEROBIC 10CC   Final   Culture  Setup Time 08/26/2012 16:54   Final   Culture     Final   Value: STAPHYLOCOCCUS AUREUS     Note: SUSCEPTIBILITIES PERFORMED ON PREVIOUS CULTURE WITHIN THE LAST 5 DAYS.     2 Note: Gram Stain Report Called to,Read Back By and Verified With: KELSEY IRELAND RN ON8 14 @  420 EDMOJ   Report Status 08/29/2012 FINAL   Final  CULTURE, BLOOD (ROUTINE X 2)     Status: None   Collection Time    08/26/12 11:30 AM      Result Value Range Status   Specimen Description BLOOD LEFT HAND   Final   Special Requests     Final   Value: BOTTLES DRAWN AEROBIC AND ANAEROBIC AERO 10CC ANA 5CC   Culture  Setup Time 08/26/2012 16:54   Final   Culture     Final   Value: STAPHYLOCOCCUS AUREUS     Note: RIFAMPIN AND GENTAMICIN SHOULD NOT BE USED AS SINGLE DRUGS FOR TREATMENT OF STAPH INFECTIONS.     2 Note: Gram Stain Report Called to,Read Back By and Verified With: KELSEY IRELAND RN ON8 14 @420 EDMOJ   Report Status 08/29/2012 FINAL   Final   Organism ID, Bacteria STAPHYLOCOCCUS AUREUS   Final  CULTURE, BLOOD (ROUTINE X 2)     Status: None   Collection Time    08/28/12 12:45 AM      Result Value Range Status   Specimen Description BLOOD RIGHT HAND   Final   Special Requests BOTTLES DRAWN AEROBIC AND ANAEROBIC 10CC   Final   Culture  Setup Time 08/28/2012 15:05   Final   Culture     Final   Value:        BLOOD CULTURE RECEIVED NO GROWTH TO  DATE CULTURE WILL BE HELD FOR 5 DAYS BEFORE ISSUING A FINAL NEGATIVE REPORT   Report Status PENDING   Incomplete  CULTURE, BLOOD (ROUTINE X 2)     Status: None   Collection Time    08/28/12 12:55 AM      Result Value Range Status   Specimen Description BLOOD RIGHT HAND   Final   Special Requests BOTTLES DRAWN AEROBIC ONLY 10CC   Final   Culture  Setup Time 08/28/2012 15:05   Final   Culture     Final   Value:        BLOOD CULTURE RECEIVED NO GROWTH TO DATE CULTURE WILL BE HELD FOR 5 DAYS BEFORE ISSUING A FINAL NEGATIVE REPORT   Report Status PENDING   Incomplete     Studies: Ct Abdomen Pelvis Wo Contrast  08/28/2012   *RADIOLOGY REPORT*  Clinical Data: Evaluate abscess.  CT ABDOMEN AND PELVIS WITHOUT CONTRAST  Technique:  Multidetector CT imaging of the abdomen and pelvis was performed following the standard protocol without intravenous contrast.  Comparison: CT of the right hip from the same date.  Findings:  BODY WALL: Unremarkable.  LOWER CHEST:  Mediastinum: Single chamber right ventricular pacer.  Coronary artery atherosclerosis.  Lungs/pleura: Small bilateral pleural effusions and associated atelectasis.  ABDOMEN/PELVIS:  Liver: No focal abnormality.  Biliary: Layering stones.  No evidence of acute cholecystitis.  Pancreas: Fatty atrophy.  Spleen: Unremarkable.  Adrenals: Unremarkable.  Kidneys and ureters: There are two exophytic right renal lesions in the interpolar region.  The largest measures 2.6 cm and is compatible with cyst - confirmed by recent sonography.  The second measures 15 mm and is soft tissue density. This was not definitely seen on prior ultrasound. This lesion may have grown mildly since imaging 01/20/2012.  No hydronephrosis or stone.  Bladder: Mild distension by urine.  Bowel:  No definite communication between the bowel and retroperitoneal collection.  Unfortunately, enteric contrast has not yet reached the colon at time of imaging.   Normal appendix.   Retroperitoneum:Extensive right   retroperitoneal hematoma gas and fluid collection, involving the lower posterior pararenal space, the right iliacus muscle, proximal right rectus femoris, iliopsoas bursa.  The largest posterior pararenal component is at the pelvic brim, measuring 9 x 4 by 6 cm.  The psoas muscle is spared.  No evidence of epidural extension of infection.  The neighboring osseous structures show no evidence of osteomyelitis.  Evaluation of the total right hip prosthesis was better accomplished on dedicated CT imaging. There is a relatively large collection posterior to the right femoral neck and greater trochanter, containing fluid and gas, measuring 7 x 3 x 3 cm. High density material present within the expanded right iliacus, measuring approximately 5 cm in diameter.  Peritoneum: No free fluid or gas.  Reproductive: Prostate enlargement calcifications.  Vascular: Extensive atherosclerotic calcification.  OSSEOUS: Total right hip prosthesis with surrounding fluid collections, as above.  Extensive degenerative disc disease with multilevel fusion and laminectomy.  No evidence of disc space infection. No suspicious lytic or blastic lesions.  IMPRESSION:  1.  Extensive complex fluid collection, likely abscess, involving the lower right retroperitoneum and right periarticular hip - size and extent described above. Although there is extensive gas within the collection, no bowel communication identified.  Probable hematoma within the iliacus component.  2. Indeterminate, possibly solid 15 mm lesion in the interpolar right kidney.  Followup imaging advised.  3. Cholelithiasis.   Original Report Authenticated By: Jonathan Watts   Dg Hip Complete Right  08/28/2012   *RADIOLOGY REPORT*  Clinical Data: 79-year-old male with right hip pain.  RIGHT HIP - COMPLETE 2+ VIEW  Comparison: 11/26/2010 radiographs  Findings: Right total hip replacement again identified. There is no evidence of fracture, subluxation or  dislocation. No complicating features are identified.  IMPRESSION: Right total hip replacement without acute abnormality.   Original Report Authenticated By: Jeffrey Hu, M.D.   Ct Lumbar Spine Wo Contrast  08/27/2012   *RADIOLOGY REPORT*  Clinical Data: Low back/right leg pain, fever, positive blood cultures  CT LUMBAR SPINE WITHOUT CONTRAST  Technique:  Multidetector CT imaging of the lumbar spine was performed without intravenous contrast administration. Multiplanar CT image reconstructions were also generated.  Comparison: CT myelogram dated 02/10/2012.  Findings: No evidence of fracture or dislocation.  Vertebral body heights are maintained.  Postsurgical changes related to prior T12-L1 posterior laminectomy/discectomy and prior L4-S1 posterior laminectomy.  Prior L3-4 fusion.  Moderate to severe multilevel degenerative changes, unchanged from prior CT myelogram.  This includes mild to moderate central canal stenosis of the upper lumbar spine and moderate to severe central canal stenosis of the lower lumbar spine with nerve root encroachment at multiple levels.  No evidence of epidural fluid collection/abscess.  Complex fluid collection in the right abdomen/pelvis, incompletely visualized.  IMPRESSION: Postsurgical changes involving the lower thoracolumbar spine.  Stable moderate to severe multilevel degenerative changes, unchanged from prior CT myelogram.  No evidence of epidural fluid collection/abscess.  Complex fluid collection in the right abdomen/pelvis, incompletely visualized.  Please refer to CT right hip report for further assessment.   Original Report Authenticated By: Sriyesh Krishnan, M.D.   Ct Hip Right Wo Contrast  08/27/2012   *RADIOLOGY REPORT*  Clinical Data: Low back/right leg pain, fever, positive blood cultures  CT OF THE RIGHT HIP WITHOUT CONTRAST  Technique:  Multidetector CT imaging was performed according to the standard protocol. Multiplanar CT image reconstructions were also  generated.  Comparison: CT abdomen pelvis dated 01/20/2012.  Findings: Right total hip arthroplasty.  No evidence of   fracture or dislocation.  Complex fluid collection/debris involving the right hip (series 5/image 36), right iliopsoas/iliacus muscles (series 5/images 9 and 23), and the visualized right lower retroperitoneum (series 5/image 1).  Representative axial measurements include: --3.8 x 8.3 cm in the right retroperitoneum (series 5/image 5) --9.2 x 5.6 cm in the right iliacus muscle (series 5/image 13) --12.7 x 7.3 cm along the right hip (series 5/image 37)  Based on this study, it is unclear whether this reflects a right hip infection which secondarily involves the lower abdomen/pelvis or whether this originated in the lower abdomen/pelvis and now extends to the right hip.  IMPRESSION: Right total hip arthroplasty.  Complex fluid collection/debris involving the right hip, right iliopsoas/iliacus muscles, and visualized right lower retroperitoneum, as described above.  Based on this study, it is unclear whether this reflects a right hip infection which secondarily involves the lower abdomen/pelvis (favored) or whether this originated in the lower abdomen/pelvis and now extends to the right hip.  Consider CT abdomen/pelvis (preferably with half dose contrast) for further evaluation.  These results were called by telephone on 08/27/2012 at 2000 hrs to Dr. Jeffrey Jenkins, who verbally acknowledged these results.   Original Report Authenticated By: Sriyesh Krishnan, M.D.    Scheduled Meds: .  ceFAZolin (ANCEF) IV  1 g Intravenous Q8H  . finasteride  5 mg Oral Daily  . insulin aspart  0-9 Units Subcutaneous TID WC  . levothyroxine  75 mcg Oral QAC breakfast  . metolazone  2.5 mg Oral Q M,W,F  . metoprolol succinate  12.5 mg Oral Daily  . potassium chloride  40 mEq Oral Daily  . vancomycin  1,500 mg Intravenous Q24H   Continuous Infusions: . sodium chloride 0.9 % 1,000 mL with potassium chloride 40  mEq infusion 75 mL/hr at 08/27/12 1000    Active Problems:   HTN (hypertension)   CKD (chronic kidney disease), stage III   Atrial fibrillation   ARF (acute renal failure)   Hypokalemia   Leukocytosis, unspecified   UTI (urinary tract infection)   Supratherapeutic INR   DM type 2 (diabetes mellitus, type 2)   Bacteremia   Retroperitoneal fluid collection   Fluid collection at surgical site    Time spent: > 35 mins    THOMPSON,DANIEL  [facility] Pager [phone]. If 7PM-7AM, please contact night-coverage at [email or web address], password [email or web address] 08/29/2012, 9:50 AM  LOS: 4 days             

## 2012-08-29 NOTE — Progress Notes (Signed)
Agree 

## 2012-08-29 NOTE — Procedures (Signed)
Procedure:  CT guided abscess drainage x 2 Findings:  2 separate 12 Fr drains placed in dominant pockets of right retroperitoneal hemorrhage.  Attached to suction bulbs.  Sample sent for culture.

## 2012-08-29 NOTE — Progress Notes (Addendum)
[facility]    Subjective: No new complaints   Antibiotics:  Anti-infectives   Start     Dose/Rate Route Frequency Ordered Stop   08/28/12 1400  ceFAZolin (ANCEF) IVPB 1 g/50 mL premix     1 g 100 mL/hr over 30 Minutes Intravenous 3 times per day 08/28/12 1136     08/28/12 0400  vancomycin (VANCOCIN) 1,500 mg in sodium chloride 0.9 % 500 mL IVPB     1,500 mg 250 mL/hr over 120 Minutes Intravenous Every 24 hours 08/27/12 0431     08/27/12 0445  vancomycin (VANCOCIN) 2,000 mg in sodium chloride 0.9 % 500 mL IVPB     2,000 mg 250 mL/hr over 120 Minutes Intravenous  Once 08/27/12 0431 08/27/12 0722   08/26/12 1000  cefTRIAXone (ROCEPHIN) 1 g in dextrose 5 % 50 mL IVPB  Status:  Discontinued     1 g 100 mL/hr over 30 Minutes Intravenous Every 24 hours 08/26/12 0913 08/27/12 1610      Medications: Scheduled Meds: .  ceFAZolin (ANCEF) IV  1 g Intravenous Q8H  . finasteride  5 mg Oral Daily  . insulin aspart  0-9 Units Subcutaneous TID WC  . levothyroxine  75 mcg Oral QAC breakfast  . metolazone  2.5 mg Oral Q M,W,F  . metoprolol succinate  12.5 mg Oral Daily  . potassium chloride  40 mEq Oral Daily  . vancomycin  1,500 mg Intravenous Q24H   Continuous Infusions: . sodium chloride 0.9 % 1,000 mL with potassium chloride 40 mEq infusion 75 mL/hr at 08/27/12 1000   PRN Meds:.acetaminophen, acetaminophen, ALPRAZolam, menthol-cetylpyridinium, morphine injection, ondansetron (ZOFRAN) IV, oxyCODONE-acetaminophen, phenol   Objective: Weight change:   Intake/Output Summary (Last 24 hours) at 08/29/12 1238 Last data filed at 08/29/12 0700  Gross per 24 hour  Intake    200 ml  Output   1800 ml  Net  -1600 ml   Blood pressure 124/70, pulse 101, temperature 98.1 F (36.7 C), temperature source Oral, resp. rate 20, height 5' 11.5" (1.816 m), weight 244 lb 1.6 oz (110.723 kg), SpO2 95.00%. Temp:  [98.1 F (36.7 C)-100.2 F (37.9 C)] 98.1 F (36.7 C)  (08/04 1018) Pulse Rate:  [92-122] 101 (08/04 1018) Resp:  [18-20] 20 (08/04 1018) BP: (103-142)/(47-75) 124/70 mmHg (08/04 1018) SpO2:  [93 %-97 %] 95 % (08/04 1018)  Physical Exam: General: Alert and awake, oriented x3, not in any acute distress. HEENT: anicteric sclera, pupils reactive to light and accommodation, EOMI CVS regular rate, normal r,  no murmur rubs or gallops Chest: clear to auscultation bilaterally, no wheezing, rales or rhonchi Abdomen: soft nontender, nondistended, normal bowel sounds, Extremities: no  clubbing or edema noted bilaterally Skin: PM site clean  Neuro: difficult easing right leg due to pain  Lab Results:  Recent Labs  08/28/12 0035 08/29/12 0540  WBC 14.4* 15.4*  HGB 9.6* 10.2*  HCT 29.5* 30.9*  PLT 224 246    BMET  Recent Labs  08/28/12 0035 08/29/12 0540  NA 134* 137  K 3.0* 3.6  CL 93* 93*  CO2 33* 34*  GLUCOSE 160* 141*  BUN 45* 31*  CREATININE 1.51* 1.43*  CALCIUM 8.5 8.7    Micro Results: Recent Results (from the past 240 hour(s))  URINE CULTURE     Status: None   Collection Time    08/26/12  7:32 AM      Result Value Range Status   Specimen Description URINE, CLEAN  CATCH   Final   Special Requests NONE   Final   Culture  Setup Time 08/26/2012 21:40   Final   Colony Count >=100,000 COLONIES/ML   Final   Culture KLEBSIELLA OZAENAE   Final   Report Status 08/29/2012 FINAL   Final   Organism ID, Bacteria KLEBSIELLA OZAENAE   Final  CULTURE, BLOOD (ROUTINE X 2)     Status: None   Collection Time    08/26/12 11:20 AM      Result Value Range Status   Specimen Description BLOOD LEFT ARM   Final   Special Requests BOTTLES DRAWN AEROBIC AND ANAEROBIC 10CC   Final   Culture  Setup Time 08/26/2012 16:54   Final   Culture     Final   Value: STAPHYLOCOCCUS AUREUS     Note: SUSCEPTIBILITIES PERFORMED ON PREVIOUS CULTURE WITHIN THE LAST 5 DAYS.     2 Note: Gram Stain Report Called to,Read Back By and Verified With: KELSEY  [place] RN ON8 14 @420  EDMOJ   Report Status 08/29/2012 FINAL   Final  CULTURE, BLOOD (ROUTINE X 2)     Status: None   Collection Time    08/26/12 11:30 AM      Result Value Range Status   Specimen Description BLOOD LEFT HAND   Final   Special Requests     Final   Value: BOTTLES DRAWN AEROBIC AND ANAEROBIC AERO 10CC ANA 5CC   Culture  Setup Time 08/26/2012 16:54   Final   Culture     Final   Value: STAPHYLOCOCCUS AUREUS     Note: RIFAMPIN AND GENTAMICIN SHOULD NOT BE USED AS SINGLE DRUGS FOR TREATMENT OF STAPH INFECTIONS.     2 Note: Gram Stain Report Called to,Read Back By and Verified With: KELSEY [place] RN ON8 14 @420  EDMOJ   Report Status 08/29/2012 FINAL   Final   Organism ID, Bacteria STAPHYLOCOCCUS AUREUS   Final  CULTURE, BLOOD (ROUTINE X 2)     Status: None   Collection Time    08/28/12 12:45 AM      Result Value Range Status   Specimen Description BLOOD RIGHT HAND   Final   Special Requests BOTTLES DRAWN AEROBIC AND ANAEROBIC 10CC   Final   Culture  Setup Time 08/28/2012 15:05   Final   Culture     Final   Value:        BLOOD CULTURE RECEIVED NO GROWTH TO DATE CULTURE WILL BE HELD FOR 5 DAYS BEFORE ISSUING A FINAL NEGATIVE REPORT   Report Status PENDING   Incomplete  CULTURE, BLOOD (ROUTINE X 2)     Status: None   Collection Time    08/28/12 12:55 AM      Result Value Range Status   Specimen Description BLOOD RIGHT HAND   Final   Special Requests BOTTLES DRAWN AEROBIC ONLY 10CC   Final   Culture  Setup Time 08/28/2012 15:05   Final   Culture     Final   Value:        BLOOD CULTURE RECEIVED NO GROWTH TO DATE CULTURE WILL BE HELD FOR 5 DAYS BEFORE ISSUING A FINAL NEGATIVE REPORT   Report Status PENDING   Incomplete    Studies/Results: Ct Abdomen Pelvis Wo Contrast  08/28/2012   *RADIOLOGY REPORT*  Clinical Data: Evaluate abscess.  CT ABDOMEN AND PELVIS WITHOUT CONTRAST  Technique:  Multidetector CT imaging of the abdomen and pelvis was performed following the standard  protocol without  intravenous contrast.  Comparison: CT of the right hip from the same date.  Findings:  BODY WALL: Unremarkable.  LOWER CHEST:  Mediastinum: Single chamber right ventricular pacer.  Coronary artery atherosclerosis.  Lungs/pleura: Small bilateral pleural effusions and associated atelectasis.  ABDOMEN/PELVIS:  Liver: No focal abnormality.  Biliary: Layering stones.  No evidence of acute cholecystitis.  Pancreas: Fatty atrophy.  Spleen: Unremarkable.  Adrenals: Unremarkable.  Kidneys and ureters: There are two exophytic right renal lesions in the interpolar region.  The largest measures 2.6 cm and is compatible with cyst - confirmed by recent sonography.  The second measures 15 mm and is soft tissue density. This was not definitely seen on prior ultrasound. This lesion may have grown mildly since imaging 1November 14, 202013.  No hydronephrosis or stone.  Bladder: Mild distension by urine.  Bowel:  No definite communication between the bowel and retroperitoneal collection.  Unfortunately, enteric contrast has not yet reached the colon at time of imaging.   Normal appendix.  Retroperitoneum:Extensive right retroperitoneal hematoma gas and fluid collection, involving the lower posterior pararenal space, the right iliacus muscle, proximal right rectus femoris, iliopsoas bursa.  The largest posterior pararenal component is at the pelvic brim, measuring 9 x 4 by 6 cm.  The psoas muscle is spared.  No evidence of epidural extension of infection.  The neighboring osseous structures show no evidence of osteomyelitis.  Evaluation of the total right hip prosthesis was better accomplished on dedicated CT imaging. There is a relatively large collection posterior to the right femoral neck and greater trochanter, containing fluid and gas, measuring 7 x 3 x 3 cm. High density material present within the expanded right iliacus, measuring approximately 5 cm in diameter.  Peritoneum: No free fluid or gas.  Reproductive: Prostate  enlargement calcifications.  Vascular: Extensive atherosclerotic calcification.  OSSEOUS: Total right hip prosthesis with surrounding fluid collections, as above.  Extensive degenerative disc disease with multilevel fusion and laminectomy.  No evidence of disc space infection. No suspicious lytic or blastic lesions.  IMPRESSION:  1.  Extensive complex fluid collection, likely abscess, involving the lower right retroperitoneum and right periarticular hip - size and extent described above. Although there is extensive gas within the collection, no bowel communication identified.  Probable hematoma within the iliacus component.  2. Indeterminate, possibly solid 15 mm lesion in the interpolar right kidney.  Followup imaging advised.  3. Cholelithiasis.   Original Report Authenticated By: Tiburcio Pea   Dg Hip Complete Right  08/28/2012   *RADIOLOGY REPORT*  Clinical Data: 79 year old male with right hip pain.  RIGHT HIP - COMPLETE 2+ VIEW  Comparison: 11/26/2010 radiographs  Findings: Right total hip replacement again identified. There is no evidence of fracture, subluxation or dislocation. No complicating features are identified.  IMPRESSION: Right total hip replacement without acute abnormality.   Original Report Authenticated By: Harmon Pier, M.D.   Ct Lumbar Spine Wo Contrast  08/27/2012   *RADIOLOGY REPORT*  Clinical Data: Low back/right leg pain, fever, positive blood cultures  CT LUMBAR SPINE WITHOUT CONTRAST  Technique:  Multidetector CT imaging of the lumbar spine was performed without intravenous contrast administration. Multiplanar CT image reconstructions were also generated.  Comparison: CT myelogram dated 02/10/2012.  Findings: No evidence of fracture or dislocation.  Vertebral body heights are maintained.  Postsurgical changes related to prior T12-L1 posterior laminectomy/discectomy and prior L4-S1 posterior laminectomy.  Prior L3-4 fusion.  Moderate to severe multilevel degenerative changes,  unchanged from prior CT myelogram.  This includes mild to moderate  central canal stenosis of the upper lumbar spine and moderate to severe central canal stenosis of the lower lumbar spine with nerve root encroachment at multiple levels.  No evidence of epidural fluid collection/abscess.  Complex fluid collection in the right abdomen/pelvis, incompletely visualized.  IMPRESSION: Postsurgical changes involving the lower thoracolumbar spine.  Stable moderate to severe multilevel degenerative changes, unchanged from prior CT myelogram.  No evidence of epidural fluid collection/abscess.  Complex fluid collection in the right abdomen/pelvis, incompletely visualized.  Please refer to CT right hip report for further assessment.   Original Report Authenticated By: Charline Bills, M.D.   Ct Hip Right Wo Contrast  08/27/2012   *RADIOLOGY REPORT*  Clinical Data: Low back/right leg pain, fever, positive blood cultures  CT OF THE RIGHT HIP WITHOUT CONTRAST  Technique:  Multidetector CT imaging was performed according to the standard protocol. Multiplanar CT image reconstructions were also generated.  Comparison: CT abdomen pelvis dated 01/20/2012.  Findings: Right total hip arthroplasty.  No evidence of fracture or dislocation.  Complex fluid collection/debris involving the right hip (series 5/image 36), right iliopsoas/iliacus muscles (series 5/images 9 and 23), and the visualized right lower retroperitoneum (series 5/image 1).  Representative axial measurements include: --3.8 x 8.3 cm in the right retroperitoneum (series 5/image 5) --9.2 x 5.6 cm in the right iliacus muscle (series 5/image 13) --12.7 x 7.3 cm along the right hip (series 5/image 37)  Based on this study, it is unclear whether this reflects a right hip infection which secondarily involves the lower abdomen/pelvis or whether this originated in the lower abdomen/pelvis and now extends to the right hip.  IMPRESSION: Right total hip arthroplasty.  Complex fluid  collection/debris involving the right hip, right iliopsoas/iliacus muscles, and visualized right lower retroperitoneum, as described above.  Based on this study, it is unclear whether this reflects a right hip infection which secondarily involves the lower abdomen/pelvis (favored) or whether this originated in the lower abdomen/pelvis and now extends to the right hip.  Consider CT abdomen/pelvis (preferably with half dose contrast) for further evaluation.  These results were called by telephone on 08/27/2012 at 2000 hrs to Dr. Tressie Stalker, who verbally acknowledged these results.   Original Report Authenticated By: Charline Bills, M.D.      Assessment/Plan: [name] is a 79 y.o. male with  Pacemaker, Right prosthetic hip, Laminectomy in January of 2014 admitted with right leg pain weakness fevers. He has been found to have MSSA bacteremia and a large retroperitoneal abscess that is adjacent to his prosthetic hip.   Orthopedics is following closely and IR attempted aspirate of the hip --all dry taps. He is to have IR guided aspiration of his abscess/hematoma today.   #1 MSSA bacteremia with PM infection (presumptive based on + blood cultures)  --Repeat blood cultures drawn --I put in call to Sharrell Ku from [facility], EP --I have asked Cardiology to perform TEE --PM will need to be removed and repeat blood cutlures drawn post removal showing clearance . I do not know whether this patient will need a temporary pacemaker whether he will BE held to complete a course of therapy without any devices in place.  --We need to ensure control of source and potential metastatic foci. I'm worried about this large abscess in his retroperitoneum. I agree may have bled there due to his elevated INR but it certainly appears to be infected. I do worry also about his prosthetic hip. Certainly was no fluid that was sent in the hip joint,  but I do worry about this being a night a total source and risk  for persistent infection. If the prosthetic hip is not explored I'm going to still assume that there is some level infection based on the abscess that is near his and radiographic findings. I would at minimum put him on chronic protracted antibiotics after we hopefully can cure his bacteremia  I spent greater than 45 minutes with the patient including greater than 50% of time in face to face counsel of the patient and in coordination of their care.    #2 intra-abdominal abscess: See above needs drainage.  #3 possible prosthetic hip infection: See above IR were unable to aspirate any fluid from the hip but might do have anxiety about this site being infected and about the risk at the prosthetic material itself could be infected and be a persistent nidus for infection. As above if this area not explored I would likely place the patient on chronic suppressive antibiotics.  #4 Bacteriuria: not clear to be UTI but covered with ancef regardless     LOS: 4 days   Acey Lav 08/29/2012, 12:38 PM

## 2012-08-30 ENCOUNTER — Encounter (HOSPITAL_COMMUNITY)
Admission: AD | Disposition: A | Discharge status: Skilled Nursing Facility | Payer: Self-pay | Source: Ambulatory Visit | Attending: Internal Medicine

## 2012-08-30 DIAGNOSIS — I319 Disease of pericardium, unspecified: Secondary | ICD-10-CM

## 2012-08-30 DIAGNOSIS — R791 Abnormal coagulation profile: Secondary | ICD-10-CM

## 2012-08-30 HISTORY — PX: TEE WITHOUT CARDIOVERSION: SHX5443

## 2012-08-30 LAB — BASIC METABOLIC PANEL
BUN: 28 mg/dL — ABNORMAL HIGH (ref 6–23)
Creatinine, Ser: 1.28 mg/dL (ref 0.50–1.35)
GFR calc Af Amer: 59 mL/min — ABNORMAL LOW (ref >90)
GFR calc non Af Amer: 51 mL/min — ABNORMAL LOW (ref >90)

## 2012-08-30 LAB — PROTIME-INR
INR: 2.54 — ABNORMAL HIGH (ref 0.00–1.49)
INR: 2.47 — ABNORMAL HIGH (ref 0.00–1.49)
Prothrombin Time: 26.5 seconds — ABNORMAL HIGH (ref 11.6–15.2)

## 2012-08-30 LAB — CBC WITH DIFFERENTIAL/PLATELET
Basophils Absolute: 0 10*3/uL (ref 0.0–0.1)
Basophils Relative: 0 % (ref 0–1)
HCT: 31.3 % — ABNORMAL LOW (ref 39.0–52.0)
MCHC: 32.9 g/dL (ref 30.0–36.0)
Monocytes Absolute: 0.8 10*3/uL (ref 0.1–1.0)
Neutro Abs: 11.2 10*3/uL — ABNORMAL HIGH (ref 1.7–7.7)
RDW: 16.1 % — ABNORMAL HIGH (ref 11.5–15.5)

## 2012-08-30 LAB — HIV-1 RNA QUANT-NO REFLEX-BLD
HIV 1 RNA Quant: <20 copies/mL (ref <20)
HIV-1 RNA Quant, Log: <1.3 {Log} (ref <1.30)

## 2012-08-30 LAB — GLUCOSE, CAPILLARY: Glucose-Capillary: 134 mg/dL — ABNORMAL HIGH (ref 70–99)

## 2012-08-30 LAB — SEDIMENTATION RATE: Sed Rate: 125 mm/hr — ABNORMAL HIGH (ref 0–16)

## 2012-08-30 LAB — C-REACTIVE PROTEIN: CRP: 23.2 mg/dL — ABNORMAL HIGH (ref <0.60)

## 2012-08-30 SURGERY — ECHOCARDIOGRAM, TRANSESOPHAGEAL
Anesthesia: Moderate Sedation

## 2012-08-30 MED ORDER — VITAMIN K1 10 MG/ML IJ SOLN
10.0000 mg | Freq: Once | INTRAVENOUS | Status: AC
  Administered 2012-08-31: 10 mg via INTRAVENOUS
  Filled 2012-08-30: qty 1

## 2012-08-30 MED ORDER — MIDAZOLAM HCL 5 MG/ML IJ SOLN
INTRAMUSCULAR | Status: AC
  Filled 2012-08-30: qty 2

## 2012-08-30 MED ORDER — PHYTONADIONE 5 MG PO TABS
5.0000 mg | ORAL_TABLET | Freq: Once | ORAL | Status: AC
  Administered 2012-08-30: 5 mg via ORAL
  Filled 2012-08-30: qty 1

## 2012-08-30 MED ORDER — FENTANYL CITRATE 0.05 MG/ML IJ SOLN
INTRAMUSCULAR | Status: DC | PRN
  Administered 2012-08-30: 25 ug via INTRAVENOUS

## 2012-08-30 MED ORDER — MIDAZOLAM HCL 10 MG/2ML IJ SOLN
INTRAMUSCULAR | Status: DC | PRN
  Administered 2012-08-30: 2 mg via INTRAVENOUS

## 2012-08-30 MED ORDER — BUTAMBEN-TETRACAINE-BENZOCAINE 2-2-14 % EX AERO
INHALATION_SPRAY | CUTANEOUS | Status: DC | PRN
  Administered 2012-08-30: 2 via TOPICAL

## 2012-08-30 MED ORDER — FENTANYL CITRATE 0.05 MG/ML IJ SOLN
INTRAMUSCULAR | Status: AC
  Filled 2012-08-30: qty 2

## 2012-08-30 NOTE — Progress Notes (Signed)
  Echocardiogram Echocardiogram Transesophageal has been performed.  Pruitt Taboada 08/30/2012, 1:47 PM

## 2012-08-30 NOTE — Progress Notes (Signed)
[facility]  Cefazolin day # 3  Subjective: No new complaints   Antibiotics:  Anti-infectives   Start     Dose/Rate Route Frequency Ordered Stop   08/28/12 1400  ceFAZolin (ANCEF) IVPB 1 g/50 mL premix     1 g 100 mL/hr over 30 Minutes Intravenous 3 times per day 08/28/12 1136     08/28/12 0400  vancomycin (VANCOCIN) 1,500 mg in sodium chloride 0.9 % 500 mL IVPB  Status:  Discontinued     1,500 mg 250 mL/hr over 120 Minutes Intravenous Every 24 hours 08/27/12 0431 08/29/12 1425   08/27/12 0445  vancomycin (VANCOCIN) 2,000 mg in sodium chloride 0.9 % 500 mL IVPB     2,000 mg 250 mL/hr over 120 Minutes Intravenous  Once 08/27/12 0431 08/27/12 0722   08/26/12 1000  cefTRIAXone (ROCEPHIN) 1 g in dextrose 5 % 50 mL IVPB  Status:  Discontinued     1 g 100 mL/hr over 30 Minutes Intravenous Every 24 hours 08/26/12 0913 08/27/12 1610      Medications: Scheduled Meds: .  ceFAZolin (ANCEF) IV  1 g Intravenous Q8H  . finasteride  5 mg Oral Daily  . insulin aspart  0-9 Units Subcutaneous TID WC  . levothyroxine  75 mcg Oral QAC breakfast  . metolazone  2.5 mg Oral Q M,W,F  . metoprolol succinate  12.5 mg Oral Daily  . potassium chloride  40 mEq Oral Daily   Continuous Infusions: . sodium chloride     PRN Meds:.acetaminophen, acetaminophen, ALPRAZolam, menthol-cetylpyridinium, morphine injection, ondansetron (ZOFRAN) IV, oxyCODONE-acetaminophen, phenol   Objective: Weight change:   Intake/Output Summary (Last 24 hours) at 08/30/12 1803 Last data filed at 08/30/12 1000  Gross per 24 hour  Intake      0 ml  Output   1915 ml  Net  -1915 ml   Blood pressure 113/67, pulse 92, temperature 98 F (36.7 C), temperature source Oral, resp. rate 20, height 5' 11.5" (1.816 m), weight 244 lb 1.6 oz (110.723 kg), SpO2 98.00%. Temp:  [98 F (36.7 C)-98.3 F (36.8 C)] 98 F (36.7 C) (08/05 1445) Pulse Rate:  [64-114] 92 (08/05 1445) Resp:  [9-34] 20 (08/05  1445) BP: (103-180)/(59-90) 113/67 mmHg (08/05 1445) SpO2:  [90 %-100 %] 98 % (08/05 1445)  Physical Exam: General: Alert and awake, oriented x3, not in any acute distress. HEENT: anicteric sclera, pupils reactive to light and accommodation, EOMI CVS regular rate, normal r,  no murmur rubs or gallops Chest: clear to auscultation bilaterally, no wheezing, rales or rhonchi Abdomen: soft nontender, nondistended, normal bowel sounds, Extremities: no  clubbing or edema noted bilaterally Skin: PM site clean  Neuro: difficult easing right leg due to pain  Lab Results:  Recent Labs  08/29/12 0540 08/30/12 0600  WBC 15.4* 13.2*  HGB 10.2* 10.3*  HCT 30.9* 31.3*  PLT 246 252    BMET  Recent Labs  08/29/12 0540 08/30/12 0600  NA 137 136  K 3.6 3.7  CL 93* 94*  CO2 34* 32  GLUCOSE 141* 140*  BUN 31* 28*  CREATININE 1.43* 1.28  CALCIUM 8.7 8.8    Micro Results: Recent Results (from the past 240 hour(s))  URINE CULTURE     Status: None   Collection Time    08/26/12  7:32 AM      Result Value Range Status   Specimen Description URINE, CLEAN CATCH   Final   Special Requests NONE   Final  Culture  Setup Time 08/26/2012 21:40   Final   Colony Count >=100,000 COLONIES/ML   Final   Culture KLEBSIELLA OZAENAE   Final   Report Status 08/29/2012 FINAL   Final   Organism ID, Bacteria KLEBSIELLA OZAENAE   Final  CULTURE, BLOOD (ROUTINE X 2)     Status: None   Collection Time    08/26/12 11:20 AM      Result Value Range Status   Specimen Description BLOOD LEFT ARM   Final   Special Requests BOTTLES DRAWN AEROBIC AND ANAEROBIC 10CC   Final   Culture  Setup Time 08/26/2012 16:54   Final   Culture     Final   Value: STAPHYLOCOCCUS AUREUS     Note: SUSCEPTIBILITIES PERFORMED ON PREVIOUS CULTURE WITHIN THE LAST 5 DAYS.     2 Note: Gram Stain Report Called to,Read Back By and Verified With: KELSEY [place] RN ON8 14 @420  EDMOJ   Report Status 08/29/2012 FINAL   Final  CULTURE,  BLOOD (ROUTINE X 2)     Status: None   Collection Time    08/26/12 11:30 AM      Result Value Range Status   Specimen Description BLOOD LEFT HAND   Final   Special Requests     Final   Value: BOTTLES DRAWN AEROBIC AND ANAEROBIC AERO 10CC ANA 5CC   Culture  Setup Time 08/26/2012 16:54   Final   Culture     Final   Value: STAPHYLOCOCCUS AUREUS     Note: RIFAMPIN AND GENTAMICIN SHOULD NOT BE USED AS SINGLE DRUGS FOR TREATMENT OF STAPH INFECTIONS.     2 Note: Gram Stain Report Called to,Read Back By and Verified With: KELSEY [place] RN ON8 14 @420  EDMOJ   Report Status 08/29/2012 FINAL   Final   Organism ID, Bacteria STAPHYLOCOCCUS AUREUS   Final  CULTURE, BLOOD (ROUTINE X 2)     Status: None   Collection Time    08/28/12 12:45 AM      Result Value Range Status   Specimen Description BLOOD RIGHT HAND   Final   Special Requests BOTTLES DRAWN AEROBIC AND ANAEROBIC 10CC   Final   Culture  Setup Time 08/28/2012 15:05   Final   Culture     Final   Value:        BLOOD CULTURE RECEIVED NO GROWTH TO DATE CULTURE WILL BE HELD FOR 5 DAYS BEFORE ISSUING A FINAL NEGATIVE REPORT   Report Status PENDING   Incomplete  CULTURE, BLOOD (ROUTINE X 2)     Status: None   Collection Time    08/28/12 12:55 AM      Result Value Range Status   Specimen Description BLOOD RIGHT HAND   Final   Special Requests BOTTLES DRAWN AEROBIC ONLY 10CC   Final   Culture  Setup Time 08/28/2012 15:05   Final   Culture     Final   Value:        BLOOD CULTURE RECEIVED NO GROWTH TO DATE CULTURE WILL BE HELD FOR 5 DAYS BEFORE ISSUING A FINAL NEGATIVE REPORT   Report Status PENDING   Incomplete  CULTURE, ROUTINE-ABSCESS     Status: None   Collection Time    08/29/12  3:01 PM      Result Value Range Status   Specimen Description ABSCESS   Final   Special Requests RIGHT RETROPERITONEAL HEMATOMA   Final   Gram Stain     Final   Value: MODERATE  WBC PRESENT, PREDOMINANTLY PMN     NO SQUAMOUS EPITHELIAL CELLS SEEN     MODERATE  GRAM POSITIVE COCCI IN CLUSTERS     Performed at [facility]   Culture     Final   Value: Culture reincubated for better growth     Performed at [facility]   Report Status PENDING   Incomplete  ANAEROBIC CULTURE     Status: None   Collection Time    08/29/12  3:01 PM      Result Value Range Status   Specimen Description ABSCESS   Final   Special Requests RIGHT RETROPERITONEAL HEMATOMA   Final   Gram Stain     Final   Value: MODERATE WBC PRESENT, PREDOMINANTLY PMN     NO SQUAMOUS EPITHELIAL CELLS SEEN     M0 GRAM POSITIVE COCCI     IN PAIRS INCISION     Performed at [facility]   Culture     Final   Value: NO ANAEROBES ISOLATED; CULTURE IN PROGRESS FOR 5 DAYS     Performed at [facility]   Report Status PENDING   Incomplete    Studies/Results: Ct Image Guided Fluid Drain By Catheter  08/29/2012   *RADIOLOGY REPORT*  Clinical Data: Right-sided retroperitoneal fluid collections containing air.  CT GUIDED DRAINAGE OF RETROPERITONEAL ABSCESS x 2  Sedation:  2.0 mg IV Versed;  50 mcg IV Fentanyl  Total Moderate Sedation Time: 36 minutes.  Procedure:  The procedure, risks, benefits, and alternatives were explained to the patient.  Questions regarding the procedure were encouraged and answered. The patient understands and consents to the procedure.  The right lateral abdominal wall was prepped with Betadine in a sterile fashion, and a sterile drape was applied covering the operative field.  A sterile gown and sterile gloves were used for the procedure. Local anesthesia was provided with 1% Lidocaine.  CT was performed in a supine position with the right side rolled up slightly.  Under CT guidance, two separate 18 gauge trocar needles were advanced into both anterior and posterior retroperitoneal fluid collections on the right.  Fluid aspiration was performed and a fluid sample sent for culture analysis.  Guide wires were advanced into the two dominant pockets.   Tracts were dilated and 12-French percutaneous drainage catheters placed. Catheter position was confirmed by CT.  The catheters were flushed with saline and connected to suction bulbs.  Both catheters were secured at the skin with Prolene retention sutures and adhesive Stat-Lock devices.  Complications: None  Findings: It did appear that the dominant retroperitoneal fluid collections do not appear to show good communication with two dominant pockets identified.  Aspiration at the level of the anterior dominant pocket revealed liquefied blood which was not grossly turbid.  Aspiration of the dominant posterior retroperitoneal collection in the iliac fossa yielded dark partially liquefied blood.  12-French drains were placed in both collections.  The more anterior collection shows much more significant initial return of liquefied blood.  The posterior collection is slowly draining after flushing the drainage catheter.  IMPRESSION: Both anterior and posterior dominant right retroperitoneal collections were aspirated and treated with placement of indwelling 12-French drains.  The more anterior collection demonstrated free return of liquefied blood.  The more posterior collection yielded much darker old blood product with slow return.  Both drains were connected to suction bulbs and will be flushed every 8 hours.   Original Report Authenticated By: Irish Lack, M.D.  Ct Image Guided Fluid Drain By Catheter  08/29/2012   *RADIOLOGY REPORT*  Clinical Data: Right-sided retroperitoneal fluid collections containing air.  CT GUIDED DRAINAGE OF RETROPERITONEAL ABSCESS x 2  Sedation:  2.0 mg IV Versed;  50 mcg IV Fentanyl  Total Moderate Sedation Time: 36 minutes.  Procedure:  The procedure, risks, benefits, and alternatives were explained to the patient.  Questions regarding the procedure were encouraged and answered. The patient understands and consents to the procedure.  The right lateral abdominal wall was prepped with  Betadine in a sterile fashion, and a sterile drape was applied covering the operative field.  A sterile gown and sterile gloves were used for the procedure. Local anesthesia was provided with 1% Lidocaine.  CT was performed in a supine position with the right side rolled up slightly.  Under CT guidance, two separate 18 gauge trocar needles were advanced into both anterior and posterior retroperitoneal fluid collections on the right.  Fluid aspiration was performed and a fluid sample sent for culture analysis.  Guide wires were advanced into the two dominant pockets.  Tracts were dilated and 12-French percutaneous drainage catheters placed. Catheter position was confirmed by CT.  The catheters were flushed with saline and connected to suction bulbs.  Both catheters were secured at the skin with Prolene retention sutures and adhesive Stat-Lock devices.  Complications: None  Findings: It did appear that the dominant retroperitoneal fluid collections do not appear to show good communication with two dominant pockets identified.  Aspiration at the level of the anterior dominant pocket revealed liquefied blood which was not grossly turbid.  Aspiration of the dominant posterior retroperitoneal collection in the iliac fossa yielded dark partially liquefied blood.  12-French drains were placed in both collections.  The more anterior collection shows much more significant initial return of liquefied blood.  The posterior collection is slowly draining after flushing the drainage catheter.  IMPRESSION: Both anterior and posterior dominant right retroperitoneal collections were aspirated and treated with placement of indwelling 12-French drains.  The more anterior collection demonstrated free return of liquefied blood.  The more posterior collection yielded much darker old blood product with slow return.  Both drains were connected to suction bulbs and will be flushed every 8 hours.   Original Report Authenticated By: Irish Lack, M.D.      Assessment/Plan: [name] is a 79 y.o. male with  Pacemaker, Right prosthetic hip, Laminectomy in January of 2014 admitted with right leg pain weakness fevers. He has been found to have MSSA bacteremia and a large retroperitoneal abscess that is adjacent to his prosthetic hip.   Orthopedics is following closely and IR attempted aspirate of the hip --all dry taps. He is to have sp IR guided aspiration of his abscess with [facility] in GS  TEE failed to show vegetations on valves or overtly on PM  #1 MSSA bacteremia with PM infection (presumptive based on + blood cultures)  --Repeat blood cultures drawn --I put in call to Sharrell Ku from [facility], EP --PM will need to be removed and repeat blood cutlures drawn post removal showing clearance . I do not know whether this patient will need a temporary pacemaker whether he will BE held to complete a course of therapy without any devices in place.  --We need to ensure control of source and potential metastatic foci. I'm worried about this large abscess in his retroperitoneum near his  prosthetic hip.   Certainly was no fluid that was sent in the hip  joint, but I do worry about this being a night a total source and risk for persistent infection. If the prosthetic hip is not explored I'm going to still assume that there is some level infection based on the abscess that is near his and radiographic findings. I would at minimum put him on chronic protracted antibiotics after we hopefully can cure his bacteremia    #2 intra-abdominal abscess:  Sp drain placement  #3 possible prosthetic hip infection: See above IR were unable to aspirate any fluid from the hip but might do have anxiety about this site being infected and about the risk at the prosthetic material itself could be infected and be a persistent nidus for infection. As above if this area not explored I would likely place the patient on chronic suppressive  antibiotics.  #4 Bacteriuria: not clear to be UTI but covered with ancef regardless     LOS: 5 days   Acey Lav 08/30/2012, 6:03 PM

## 2012-08-30 NOTE — CV Procedure (Signed)
Procedure: TEE  Sedation: Versed 2 mg IV, Fentanyl 25 mcg IV  Indication: MSSA bacteremia, assess for endocarditis.  Findings: Please see report in echo section for full details.  Normal LV size with mild LV hypertrophy.  EF 60-65%.  Normal RV size and systolic function.  A small-moderate pericardial effusion was noted without evidence for tamponade.  There was mild MR.  No valvular vegetation was noted.  No vegetation on pacemaker leads noted but the pacemaker could not be well-visualized.  No LAA thrombus noted but there was smoke in the left atrium.  The patient was in atrial fibrillation.   Conclusion: No evidence for endocarditis.   [name] 08/30/2012 1:34 PM

## 2012-08-30 NOTE — Progress Notes (Signed)
Agree with above, doubt will need surgery

## 2012-08-30 NOTE — Progress Notes (Signed)
Subjective: Patient states he is feeling better today.   Objective: Physical Exam: BP 137/80  Pulse 97  Temp(Src) 98.3 F (36.8 C) (Oral)  Resp 18  Ht 5' 11.5" (1.816 m)  Wt 244 lb 1.6 oz (110.723 kg)  BMI 33.57 kg/m2  SpO2 93%  Rt. Retroperitoneal JP drain #1 155 cc blood tinged output. Dressing C/D/I Rt. Retroperitoneal JP drain #2 240 cc blood tinged cloudy output. Dressing C/D/I Abdomen: Soft, NT, (+) BS  Labs: CBC  Recent Labs  08/29/12 0540 08/30/12 0600  WBC 15.4* 13.2*  HGB 10.2* 10.3*  HCT 30.9* 31.3*  PLT 246 252   BMET  Recent Labs  08/29/12 0540 08/30/12 0600  NA 137 136  K 3.6 3.7  CL 93* 94*  CO2 34* 32  GLUCOSE 141* 140*  BUN 31* 28*  CREATININE 1.43* 1.28  CALCIUM 8.7 8.8   LFT  Recent Labs  08/28/12 0035  PROT 6.3  ALBUMIN 1.9*  AST 82*  ALT 49  ALKPHOS 94  BILITOT 1.3*   PT/INR  Recent Labs  08/29/12 0540 08/30/12 0600  LABPROT 24.7* 26.5*  INR 2.32* 2.54*     Studies/Results: Ct Guided Abscess Drain  08/29/2012   *RADIOLOGY REPORT*  Clinical Data: Right-sided retroperitoneal fluid collections containing air.  CT GUIDED DRAINAGE OF RETROPERITONEAL ABSCESS x 2  Sedation:  2.0 mg IV Versed;  50 mcg IV Fentanyl  Total Moderate Sedation Time: 36 minutes.  Procedure:  The procedure, risks, benefits, and alternatives were explained to the patient.  Questions regarding the procedure were encouraged and answered. The patient understands and consents to the procedure.  The right lateral abdominal wall was prepped with Betadine in a sterile fashion, and a sterile drape was applied covering the operative field.  A sterile gown and sterile gloves were used for the procedure. Local anesthesia was provided with 1% Lidocaine.  CT was performed in a supine position with the right side rolled up slightly.  Under CT guidance, two separate 18 gauge trocar needles were advanced into both anterior and posterior retroperitoneal fluid collections on  the right.  Fluid aspiration was performed and a fluid sample sent for culture analysis.  Guide wires were advanced into the two dominant pockets.  Tracts were dilated and 12-French percutaneous drainage catheters placed. Catheter position was confirmed by CT.  The catheters were flushed with saline and connected to suction bulbs.  Both catheters were secured at the skin with Prolene retention sutures and adhesive Stat-Lock devices.  Complications: None  Findings: It did appear that the dominant retroperitoneal fluid collections do not appear to show good communication with two dominant pockets identified.  Aspiration at the level of the anterior dominant pocket revealed liquefied blood which was not grossly turbid.  Aspiration of the dominant posterior retroperitoneal collection in the iliac fossa yielded dark partially liquefied blood.  12-French drains were placed in both collections.  The more anterior collection shows much more significant initial return of liquefied blood.  The posterior collection is slowly draining after flushing the drainage catheter.  IMPRESSION: Both anterior and posterior dominant right retroperitoneal collections were aspirated and treated with placement of indwelling 12-French drains.  The more anterior collection demonstrated free return of liquefied blood.  The more posterior collection yielded much darker old blood product with slow return.  Both drains were connected to suction bulbs and will be flushed every 8 hours.   Original Report Authenticated By: Irish Lack, M.D.   Ct Guided Abscess Drain  08/29/2012   *  RADIOLOGY REPORT*  Clinical Data: Right-sided retroperitoneal fluid collections containing air.  CT GUIDED DRAINAGE OF RETROPERITONEAL ABSCESS x 2  Sedation:  2.0 mg IV Versed;  50 mcg IV Fentanyl  Total Moderate Sedation Time: 36 minutes.  Procedure:  The procedure, risks, benefits, and alternatives were explained to the patient.  Questions regarding the procedure were  encouraged and answered. The patient understands and consents to the procedure.  The right lateral abdominal wall was prepped with Betadine in a sterile fashion, and a sterile drape was applied covering the operative field.  A sterile gown and sterile gloves were used for the procedure. Local anesthesia was provided with 1% Lidocaine.  CT was performed in a supine position with the right side rolled up slightly.  Under CT guidance, two separate 18 gauge trocar needles were advanced into both anterior and posterior retroperitoneal fluid collections on the right.  Fluid aspiration was performed and a fluid sample sent for culture analysis.  Guide wires were advanced into the two dominant pockets.  Tracts were dilated and 12-French percutaneous drainage catheters placed. Catheter position was confirmed by CT.  The catheters were flushed with saline and connected to suction bulbs.  Both catheters were secured at the skin with Prolene retention sutures and adhesive Stat-Lock devices.  Complications: None  Findings: It did appear that the dominant retroperitoneal fluid collections do not appear to show good communication with two dominant pockets identified.  Aspiration at the level of the anterior dominant pocket revealed liquefied blood which was not grossly turbid.  Aspiration of the dominant posterior retroperitoneal collection in the iliac fossa yielded dark partially liquefied blood.  12-French drains were placed in both collections.  The more anterior collection shows much more significant initial return of liquefied blood.  The posterior collection is slowly draining after flushing the drainage catheter.  IMPRESSION: Both anterior and posterior dominant right retroperitoneal collections were aspirated and treated with placement of indwelling 12-French drains.  The more anterior collection demonstrated free return of liquefied blood.  The more posterior collection yielded much darker old blood product with slow  return.  Both drains were connected to suction bulbs and will be flushed every 8 hours.   Original Report Authenticated By: Irish Lack, M.D.    Assessment/Plan: Right retroperitoneal hemorrhage s/p (2) 81F percutanous JP drains placed 08/29/12. Output is good and cultures pending/showing moderate Gram (+) Cocci, ID on board. Continue to flush and monitor output. WBC trending down 13.2 (15.4) and afebrile. Patient is on IV vancomycin and ancef. Right hip pain adjacent to hematoma, history of Right hip prosthetic, concern for infection, Orthopedics on board and unsuccessful attempt of joint aspiration.   LOS: 5 days    Berneta Levins PA-C 08/30/2012 10:00 AM

## 2012-08-30 NOTE — Consult Note (Signed)
ELECTROPHYSIOLOGY CONSULT NOTE    Patient IDAlekai Pocock MRN: [id number], DOB/AGE: 01-Aug-1930 79 y.o.  Admit date: 08/25/2012 Date of Consult: 08-30-2012  Primary Physician: Malka So., MD Primary Cardiologist: [facility]  Reason for Consultation: bacteremia  HPI:  Mr. Kiester is an 79 year old male with a past medical history significant for atrial fibrillation, hypertension, diabetes, and bradycardia status post pacemaker implant in 2008 [facility] single chamber).    He presented to the hospital on 7-31 c/o hip and right flank pain.  He has been found to have MSSA bacteremia and a large retroperitoneal abscess that is adjacent to his prosthetic hip.  Aspiration of his hip was attempted, but they were dry taps.    EP has been asked to evaluate for device system removal.  His device was interrogated today and found to be functioning normally.  He is V pacing 41% of the time.    Past Medical History  Diagnosis Date  . Atrial fibrillation   . Hypertension   . Pacemaker   . Diabetes mellitus without complication     Borderline  . Arthritis   . Anxiety   . Depression      Surgical History:  Past Surgical History  Procedure Laterality Date  . Pacemaker insertion    . Lumbar laminectomy/decompression microdiscectomy  02/14/2012    Procedure: LUMBAR LAMINECTOMY/DECOMPRESSION MICRODISCECTOMY 1 LEVEL;  Surgeon: Tia Alert, MD;  Location: [facility];  Service: Neurosurgery;  Laterality: N/A;  Thoracic twelve - Lumbar one decompressive laminectomy.     Prescriptions prior to admission  Medication Sig Dispense Refill  . ALPRAZolam (XANAX) 0.5 MG tablet Take 1 tablet (0.5 mg total) by mouth daily as needed. For anxiety  5 tablet  0  . diphenhydramine-acetaminophen (TYLENOL PM) 25-500 MG TABS Take 1 tablet by mouth at bedtime as needed.      . finasteride (PROSCAR) 5 MG tablet Take 5 mg by mouth daily.        . furosemide (LASIX) 40 MG  tablet Take 40 mg by mouth daily.        Marland Kitchen HYDROcodone-acetaminophen (VICODIN) 5-500 MG per tablet Take 1 tablet by mouth every 4 (four) hours as needed for pain.  5 tablet  0  . levothyroxine (SYNTHROID, LEVOTHROID) 75 MCG tablet Take 75 mcg by mouth daily.        Marland Kitchen lovastatin (MEVACOR) 20 MG tablet Take 40 mg by mouth at bedtime.        . metolazone (ZAROXOLYN) 2.5 MG tablet Take 2.5 mg by mouth every Monday, Wednesday, and Friday. As needed for fluid retention.      . metoprolol succinate (TOPROL-XL) 25 MG 24 hr tablet Take 12.5 mg by mouth daily.      . pioglitazone (ACTOS) 15 MG tablet Take 1 tablet by mouth daily.      Marland Kitchen terazosin (HYTRIN) 5 MG capsule Take 5 mg by mouth daily.        Marland Kitchen warfarin (COUMADIN) 4 MG tablet Take 1 tablet (4 mg total) by mouth daily. Hasn't taken since last Thursday due to the anticipation of the CT scan that took place today.  Usually takes daily        Inpatient Medications:  . [MAR HOLD]  ceFAZolin (ANCEF) IV  1 g Intravenous Q8H  . [MAR HOLD] finasteride  5 mg Oral Daily  . [MAR HOLD] insulin aspart  0-9 Units Subcutaneous TID WC  . [facility] HOLD] levothyroxine  75  mcg Oral QAC breakfast  . [MAR HOLD] metolazone  2.5 mg Oral Q M,W,F  . [MAR HOLD] metoprolol succinate  12.5 mg Oral Daily  . [facility] HOLD] potassium chloride  40 mEq Oral Daily    Allergies: No Known Allergies  History   Social History  . Marital Status: Married    Spouse Name: N/A    Number of Children: N/A  . Years of Education: N/A   Occupational History  . Not on file.   Social History Main Topics  . Smoking status: Never Smoker   . Smokeless tobacco: Not on file  . Alcohol Use: No  . Drug Use: No  . Sexually Active: Not on file   Other Topics Concern  . Not on file   Social History Narrative  . No narrative on file       Labs:   Lab Results  Component Value Date   WBC 13.2* 08/30/2012   HGB 10.3* 08/30/2012   HCT 31.3* 08/30/2012   MCV 89.9 08/30/2012   PLT 252 08/30/2012     Recent Labs Lab 08/28/12 0035  08/30/12 0600  NA 134*  < > 136  K 3.0*  < > 3.7  CL 93*  < > 94*  CO2 33*  < > 32  BUN 45*  < > 28*  CREATININE 1.51*  < > 1.28  CALCIUM 8.5  < > 8.8  PROT 6.3  --   --   BILITOT 1.3*  --   --   ALKPHOS 94  --   --   ALT 49  --   --   AST 82*  --   --   GLUCOSE 160*  < > 140*  < > = values in this interval not displayed.   Radiology/Studies: Ct Abdomen Pelvis Wo Contrast 08/28/2012   *RADIOLOGY REPORT*  Clinical Data: Evaluate abscess.  CT ABDOMEN AND PELVIS WITHOUT CONTRAST  Technique:  Multidetector CT imaging of the abdomen and pelvis was performed following the standard protocol without intravenous contrast.  Comparison: CT of the right hip from the same date.  Findings:  BODY WALL: Unremarkable.  LOWER CHEST:  Mediastinum: Single chamber right ventricular pacer.  Coronary artery atherosclerosis.  Lungs/pleura: Small bilateral pleural effusions and associated atelectasis.  ABDOMEN/PELVIS:  Liver: No focal abnormality.  Biliary: Layering stones.  No evidence of acute cholecystitis.  Pancreas: Fatty atrophy.  Spleen: Unremarkable.  Adrenals: Unremarkable.  Kidneys and ureters: There are two exophytic right renal lesions in the interpolar region.  The largest measures 2.6 cm and is compatible with cyst - confirmed by recent sonography.  The second measures 15 mm and is soft tissue density. This was not definitely seen on prior ultrasound. This lesion may have grown mildly since imaging 01/20/2012.  No hydronephrosis or stone.  Bladder: Mild distension by urine.  Bowel:  No definite communication between the bowel and retroperitoneal collection.  Unfortunately, enteric contrast has not yet reached the colon at time of imaging.   Normal appendix.  Retroperitoneum:Extensive right retroperitoneal hematoma gas and fluid collection, involving the lower posterior pararenal space, the right iliacus muscle, proximal right rectus femoris, iliopsoas bursa.  The largest  posterior pararenal component is at the pelvic brim, measuring 9 x 4 by 6 cm.  The psoas muscle is spared.  No evidence of epidural extension of infection.  The neighboring osseous structures show no evidence of osteomyelitis.  Evaluation of the total right hip prosthesis was better accomplished on dedicated CT imaging. There  is a relatively large collection posterior to the right femoral neck and greater trochanter, containing fluid and gas, measuring 7 x 3 x 3 cm. High density material present within the expanded right iliacus, measuring approximately 5 cm in diameter.  Peritoneum: No free fluid or gas.  Reproductive: Prostate enlargement calcifications.  Vascular: Extensive atherosclerotic calcification.  OSSEOUS: Total right hip prosthesis with surrounding fluid collections, as above.  Extensive degenerative disc disease with multilevel fusion and laminectomy.  No evidence of disc space infection. No suspicious lytic or blastic lesions.  IMPRESSION:  1.  Extensive complex fluid collection, likely abscess, involving the lower right retroperitoneum and right periarticular hip - size and extent described above. Although there is extensive gas within the collection, no bowel communication identified.  Probable hematoma within the iliacus component.  2. Indeterminate, possibly solid 15 mm lesion in the interpolar right kidney.  Followup imaging advised.  3. Cholelithiasis.   Original Report Authenticated By: Tiburcio Pea   Dg Hip Complete Right 08/28/2012   *RADIOLOGY REPORT*  Clinical Data: 79 year old male with right hip pain.  RIGHT HIP - COMPLETE 2+ VIEW  Comparison: 11/26/2010 radiographs  Findings: Right total hip replacement again identified. There is no evidence of fracture, subluxation or dislocation. No complicating features are identified.  IMPRESSION: Right total hip replacement without acute abnormality.   Original Report Authenticated By: Harmon Pier, M.D.    Dg Chest Port 1 View 08/26/2012    *RADIOLOGY REPORT*  Clinical Data: Elevated white blood count  PORTABLE CHEST - 1 VIEW  Comparison: November 11, 2010.  Findings: Stable cardiomediastinal silhouette.  Left-sided pacemaker is unchanged in position.  No acute pulmonary disease is noted.  No pneumothorax or pleural effusion is noted.  Degenerative joint disease is seen involving both shoulders.  IMPRESSION: No acute cardiopulmonary abnormality seen.   Original Report Authenticated By: Lupita Raider.,  M.D.   [id number] fibrillation with intermittent ventricular pacing  TELEMETRY: atrial fibrillation with controlled ventricular response  DEVICE HISTORY: [employer]tronic single chamber pacemaker with model number [id number] right ventricular lead implanted in 2008  A/P 1. Staph Bacteremia 2. Retroperitoneal abscess 3. Chronic atrial fibrillation 4. S/p PPM insertion 2008 by report 5. Systemic anti-coagulation with warfarin and currently therapeutic INR Rec: he has been given Vit K. Hopefully INR will correct to less than two for lead extraction. PPM lead extraction for Staph bacteremia is a class 1 indication for lead extraction. I have discussed the risks/benefits/goals/expectations of the procedure with the patient and he wishes to proceed.  Leonia Reeves.D.

## 2012-08-30 NOTE — H&P (View-Only) (Signed)
[facility] PROGRESS NOTE  [name] NFA:[id number] DOB: December 19, 1930 DOA: 08/25/2012 PCP: Malka So., MD  Assessment/Plan: #1 MSSA bacteremia Questionable etiology. Preliminary blood cultures were 2 out of 2 gram-positive cocci in clusters. CT of the hip abdomen and pelvis concerning for complex fluid collection of the left hip and possible retroperitoneal hematoma. Continue empiric IV vancomycin and cefazolin as per ID recommendations. Repeat blood cultures pending. ID recommending TEE to r/o endocarditis. ID following and appreciate input and recommendations.  #2 Klebsiella ozaenae UTI Continue IV cefazolin.  #3 Large retroperitoneal complex fluid collection and complex fluid collection of the right hip abscess vs hematoma Patient noted to have an INR as high as 7.19 on day of admission. INR has been reversed. CT of the hip and CT of the abdomen and pelvis worrisome for complex fluid collection around the right hip and also retroperitoneal hematoma. Gen. surgery and orthopedics have been consulted. Interventional radiology for aspiration and possible drainage today. If aspirated will likely need to send for Gram stain and culture. Continue empiric IV antibiotics. Continue supportive care. Warm compresses. Follow.   #4 right lower extremity weakness and back pain Likely secondary to retroperitoneal hematoma and possible complex fluid collection noted around the right hip. Orthopedics has been consulted as general surgery. Per neurosurgery.  #5 acute renal failure/dehydration/hypokalemia Acute renal failure secondary to prerenal azotemia. Renal function improved. Normal saline lock IV fluids. Patient also noted to be in [place] which is the likely etiology of this is hypokalemia. Replete potassium. Will place on daily potassium.  #6 supratherapeutic INR Status post vitamin K doses x2. Status post FFP. INR is now subtherapeutic but trending back up. Patient likely has a  retroperitoneal hematoma and a such will hold off on anticoagulations for now. Repeat INR in the morning. Follow.  #7 leukocytosis Likely secondary to problems #1 and 2. Urine cultures with greater than 100,000 of Klebsiella ozaenae. Blood cultures with MSSA . Continue empiric IV vancomycin and cefazolin per ID recommendations. ID following and appreciate input and recommendations.  #8 right-sided abdominal tenderness Clinical improvement. Likely secondary to retroperitoneal hematoma. Abdominal ultrasound and abdominal films negative for any acute abnormalities. Abdominal ultrasound positive for hepatic steatosis. Follow. Pain management. Supportive care.  #9 atrial fibrillation Continue beta blocker for rate control. INR has been reversed, but trending back up. Patient is status post permanent pacemaker.  Follow.  #10 normocytic anemia Stable. Follow H&H.  #11 type 2 diabetes Continue sliding scale insulin.  Code Status: Full Family Communication: Updated patient at bedside. Disposition Plan: Per primary team.   Consultants:   infectious diseases: Dr., 08/27/2012  General surgery: Dr. Luisa Hart 08/28/2012  Orthopedic surgery: Dr. Ave Filter 08/28/2012   Procedures:  Acute abdominal series 08/26/2012  Chest x-ray 08/26/2012  Antibiotics:  IV vancomycin 08/26/2012  IV Rocephin 08/26/2012---. 08/27/12  IV Ancef  08/28/2012   HPI/Subjective: Patient states right lower extremity weakness and numbness unchanged. No complaints.  Objective: Filed Vitals:   08/28/12 1755 08/28/12 2200 08/29/12 0200 08/29/12 0600  BP: 120/71 103/47 142/69 132/75  Pulse: 98 92 122 103  Temp: 98.2 F (36.8 C) 98.7 F (37.1 C) 99.9 F (37.7 C) 99.3 F (37.4 C)  TempSrc: Oral     Resp: 20 18 18 18   Height:      Weight:      SpO2: 95% 97% 96% 95%    Intake/Output Summary (Last 24 hours) at 08/29/12 0950 Last data filed at 08/29/12 0700  Gross per 24 hour  Intake  440 ml  Output    2700 ml  Net  -2260 ml   Filed Weights   08/25/12 2128  Weight: 110.723 kg (244 lb 1.6 oz)    Exam:   General:  NAD  Cardiovascular: RRR  Respiratory: CTAB  Abdomen: SOFT/NT/ND/+BS  EXTREMITIES: Right hip tender to palpation. 0-5 right lower extremity strength. 4/5 left lower extremity shin. 5 out of 5 bilateral upper extremity strength.  Data Reviewed: Basic Metabolic Panel:  Recent Labs Lab 08/26/12 1120 08/26/12 2118 08/27/12 0530 08/27/12 1435 08/28/12 0035 08/29/12 0540  NA 134* 136 137 138 134* 137  K 2.7* 2.3* 2.6* 3.7 3.0* 3.6  CL 91* 90* 91* 95* 93* 93*  CO2 33* 34* 34* 35* 33* 34*  GLUCOSE 139* 146* 153* 152* 160* 141*  BUN 71* 64* 59* 54* 45* 31*  CREATININE 2.51* 2.07* 1.95* 1.78* 1.51* 1.43*  CALCIUM 8.6 9.0 8.9 8.7 8.5 8.7  MG 2.7*  --   --   --   --   --    Liver Function Tests:  Recent Labs Lab 08/26/12 1120 08/27/12 0530 08/28/12 0035  AST 192* 117* 82*  ALT 78* 60* 49  ALKPHOS 89 97 94  BILITOT 0.7 1.2 1.3*  PROT 6.3 6.4 6.3  ALBUMIN 1.8* 2.3* 1.9*   No results found for this basename: LIPASE, AMYLASE,  in the last 168 hours No results found for this basename: AMMONIA,  in the last 168 hours CBC:  Recent Labs Lab 08/25/12 2108 08/26/12 0748 08/26/12 0751 08/27/12 0530 08/28/12 0035 08/29/12 0540  WBC 22.7*  --  23.7* 17.0* 14.4* 15.4*  NEUTROABS  --  20.9*  --  15.3*  --  13.4*  HGB 10.3*  --  10.3* 9.9* 9.6* 10.2*  HCT 29.5*  --  29.7* 28.7* 29.5* 30.9*  MCV 87.5  --  87.1 88.3 89.7 90.4  PLT 207  --  226 218 224 246   Cardiac Enzymes: No results found for this basename: CKTOTAL, CKMB, CKMBINDEX, TROPONINI,  in the last 168 hours BNP (last 3 results) No results found for this basename: PROBNP,  in the last 8760 hours CBG:  Recent Labs Lab 08/28/12 0644 08/28/12 1143 08/28/12 1636 08/28/12 2137 08/29/12 0655  GLUCAP 186* 151* 163* 173* 134*    Recent Results (from the past 240 hour(s))  URINE CULTURE      Status: None   Collection Time    08/26/12  7:32 AM      Result Value Range Status   Specimen Description URINE, CLEAN CATCH   Final   Special Requests NONE   Final   Culture  Setup Time 08/26/2012 21:40   Final   Colony Count >=100,000 COLONIES/ML   Final   Culture GRAM NEGATIVE RODS   Final   Report Status PENDING   Incomplete  CULTURE, BLOOD (ROUTINE X 2)     Status: None   Collection Time    08/26/12 11:20 AM      Result Value Range Status   Specimen Description BLOOD LEFT ARM   Final   Special Requests BOTTLES DRAWN AEROBIC AND ANAEROBIC 10CC   Final   Culture  Setup Time 08/26/2012 16:54   Final   Culture     Final   Value: STAPHYLOCOCCUS AUREUS     Note: SUSCEPTIBILITIES PERFORMED ON PREVIOUS CULTURE WITHIN THE LAST 5 DAYS.     2 Note: Gram Stain Report Called to,Read Back By and Verified With: KELSEY [place] RN ON8 14 @  420 EDMOJ   Report Status 08/29/2012 FINAL   Final  CULTURE, BLOOD (ROUTINE X 2)     Status: None   Collection Time    08/26/12 11:30 AM      Result Value Range Status   Specimen Description BLOOD LEFT HAND   Final   Special Requests     Final   Value: BOTTLES DRAWN AEROBIC AND ANAEROBIC AERO 10CC ANA 5CC   Culture  Setup Time 08/26/2012 16:54   Final   Culture     Final   Value: STAPHYLOCOCCUS AUREUS     Note: RIFAMPIN AND GENTAMICIN SHOULD NOT BE USED AS SINGLE DRUGS FOR TREATMENT OF STAPH INFECTIONS.     2 Note: Gram Stain Report Called to,Read Back By and Verified With: KELSEY [place] RN ON8 14 @420  EDMOJ   Report Status 08/29/2012 FINAL   Final   Organism ID, Bacteria STAPHYLOCOCCUS AUREUS   Final  CULTURE, BLOOD (ROUTINE X 2)     Status: None   Collection Time    08/28/12 12:45 AM      Result Value Range Status   Specimen Description BLOOD RIGHT HAND   Final   Special Requests BOTTLES DRAWN AEROBIC AND ANAEROBIC 10CC   Final   Culture  Setup Time 08/28/2012 15:05   Final   Culture     Final   Value:        BLOOD CULTURE RECEIVED NO GROWTH TO  DATE CULTURE WILL BE HELD FOR 5 DAYS BEFORE ISSUING A FINAL NEGATIVE REPORT   Report Status PENDING   Incomplete  CULTURE, BLOOD (ROUTINE X 2)     Status: None   Collection Time    08/28/12 12:55 AM      Result Value Range Status   Specimen Description BLOOD RIGHT HAND   Final   Special Requests BOTTLES DRAWN AEROBIC ONLY 10CC   Final   Culture  Setup Time 08/28/2012 15:05   Final   Culture     Final   Value:        BLOOD CULTURE RECEIVED NO GROWTH TO DATE CULTURE WILL BE HELD FOR 5 DAYS BEFORE ISSUING A FINAL NEGATIVE REPORT   Report Status PENDING   Incomplete     Studies: Ct Abdomen Pelvis Wo Contrast  08/28/2012   *RADIOLOGY REPORT*  Clinical Data: Evaluate abscess.  CT ABDOMEN AND PELVIS WITHOUT CONTRAST  Technique:  Multidetector CT imaging of the abdomen and pelvis was performed following the standard protocol without intravenous contrast.  Comparison: CT of the right hip from the same date.  Findings:  BODY WALL: Unremarkable.  LOWER CHEST:  Mediastinum: Single chamber right ventricular pacer.  Coronary artery atherosclerosis.  Lungs/pleura: Small bilateral pleural effusions and associated atelectasis.  ABDOMEN/PELVIS:  Liver: No focal abnormality.  Biliary: Layering stones.  No evidence of acute cholecystitis.  Pancreas: Fatty atrophy.  Spleen: Unremarkable.  Adrenals: Unremarkable.  Kidneys and ureters: There are two exophytic right renal lesions in the interpolar region.  The largest measures 2.6 cm and is compatible with cyst - confirmed by recent sonography.  The second measures 15 mm and is soft tissue density. This was not definitely seen on prior ultrasound. This lesion may have grown mildly since imaging 12020/06/1511.  No hydronephrosis or stone.  Bladder: Mild distension by urine.  Bowel:  No definite communication between the bowel and retroperitoneal collection.  Unfortunately, enteric contrast has not yet reached the colon at time of imaging.   Normal appendix.   Retroperitoneum:Extensive right  retroperitoneal hematoma gas and fluid collection, involving the lower posterior pararenal space, the right iliacus muscle, proximal right rectus femoris, iliopsoas bursa.  The largest posterior pararenal component is at the pelvic brim, measuring 9 x 4 by 6 cm.  The psoas muscle is spared.  No evidence of epidural extension of infection.  The neighboring osseous structures show no evidence of osteomyelitis.  Evaluation of the total right hip prosthesis was better accomplished on dedicated CT imaging. There is a relatively large collection posterior to the right femoral neck and greater trochanter, containing fluid and gas, measuring 7 x 3 x 3 cm. High density material present within the expanded right iliacus, measuring approximately 5 cm in diameter.  Peritoneum: No free fluid or gas.  Reproductive: Prostate enlargement calcifications.  Vascular: Extensive atherosclerotic calcification.  OSSEOUS: Total right hip prosthesis with surrounding fluid collections, as above.  Extensive degenerative disc disease with multilevel fusion and laminectomy.  No evidence of disc space infection. No suspicious lytic or blastic lesions.  IMPRESSION:  1.  Extensive complex fluid collection, likely abscess, involving the lower right retroperitoneum and right periarticular hip - size and extent described above. Although there is extensive gas within the collection, no bowel communication identified.  Probable hematoma within the iliacus component.  2. Indeterminate, possibly solid 15 mm lesion in the interpolar right kidney.  Followup imaging advised.  3. Cholelithiasis.   Original Report Authenticated By: Tiburcio Pea   Dg Hip Complete Right  08/28/2012   *RADIOLOGY REPORT*  Clinical Data: 79 year old male with right hip pain.  RIGHT HIP - COMPLETE 2+ VIEW  Comparison: 11/26/2010 radiographs  Findings: Right total hip replacement again identified. There is no evidence of fracture, subluxation or  dislocation. No complicating features are identified.  IMPRESSION: Right total hip replacement without acute abnormality.   Original Report Authenticated By: Harmon Pier, M.D.   Ct Lumbar Spine Wo Contrast  08/27/2012   *RADIOLOGY REPORT*  Clinical Data: Low back/right leg pain, fever, positive blood cultures  CT LUMBAR SPINE WITHOUT CONTRAST  Technique:  Multidetector CT imaging of the lumbar spine was performed without intravenous contrast administration. Multiplanar CT image reconstructions were also generated.  Comparison: CT myelogram dated 02/10/2012.  Findings: No evidence of fracture or dislocation.  Vertebral body heights are maintained.  Postsurgical changes related to prior T12-L1 posterior laminectomy/discectomy and prior L4-S1 posterior laminectomy.  Prior L3-4 fusion.  Moderate to severe multilevel degenerative changes, unchanged from prior CT myelogram.  This includes mild to moderate central canal stenosis of the upper lumbar spine and moderate to severe central canal stenosis of the lower lumbar spine with nerve root encroachment at multiple levels.  No evidence of epidural fluid collection/abscess.  Complex fluid collection in the right abdomen/pelvis, incompletely visualized.  IMPRESSION: Postsurgical changes involving the lower thoracolumbar spine.  Stable moderate to severe multilevel degenerative changes, unchanged from prior CT myelogram.  No evidence of epidural fluid collection/abscess.  Complex fluid collection in the right abdomen/pelvis, incompletely visualized.  Please refer to CT right hip report for further assessment.   Original Report Authenticated By: Charline Bills, M.D.   Ct Hip Right Wo Contrast  08/27/2012   *RADIOLOGY REPORT*  Clinical Data: Low back/right leg pain, fever, positive blood cultures  CT OF THE RIGHT HIP WITHOUT CONTRAST  Technique:  Multidetector CT imaging was performed according to the standard protocol. Multiplanar CT image reconstructions were also  generated.  Comparison: CT abdomen pelvis dated 12020-02-2611.  Findings: Right total hip arthroplasty.  No evidence of  fracture or dislocation.  Complex fluid collection/debris involving the right hip (series 5/image 36), right iliopsoas/iliacus muscles (series 5/images 9 and 23), and the visualized right lower retroperitoneum (series 5/image 1).  Representative axial measurements include: --3.8 x 8.3 cm in the right retroperitoneum (series 5/image 5) --9.2 x 5.6 cm in the right iliacus muscle (series 5/image 13) --12.7 x 7.3 cm along the right hip (series 5/image 37)  Based on this study, it is unclear whether this reflects a right hip infection which secondarily involves the lower abdomen/pelvis or whether this originated in the lower abdomen/pelvis and now extends to the right hip.  IMPRESSION: Right total hip arthroplasty.  Complex fluid collection/debris involving the right hip, right iliopsoas/iliacus muscles, and visualized right lower retroperitoneum, as described above.  Based on this study, it is unclear whether this reflects a right hip infection which secondarily involves the lower abdomen/pelvis (favored) or whether this originated in the lower abdomen/pelvis and now extends to the right hip.  Consider CT abdomen/pelvis (preferably with half dose contrast) for further evaluation.  These results were called by telephone on 08/27/2012 at 2000 hrs to Dr. Tressie Stalker, who verbally acknowledged these results.   Original Report Authenticated By: Charline Bills, M.D.    Scheduled Meds: .  ceFAZolin (ANCEF) IV  1 g Intravenous Q8H  . finasteride  5 mg Oral Daily  . insulin aspart  0-9 Units Subcutaneous TID WC  . levothyroxine  75 mcg Oral QAC breakfast  . metolazone  2.5 mg Oral Q M,W,F  . metoprolol succinate  12.5 mg Oral Daily  . potassium chloride  40 mEq Oral Daily  . vancomycin  1,500 mg Intravenous Q24H   Continuous Infusions: . sodium chloride 0.9 % 1,000 mL with potassium chloride 40  mEq infusion 75 mL/hr at 08/27/12 1000    Active Problems:   HTN (hypertension)   CKD (chronic kidney disease), stage III   Atrial fibrillation   ARF (acute renal failure)   Hypokalemia   Leukocytosis, unspecified   UTI (urinary tract infection)   Supratherapeutic INR   DM type 2 (diabetes mellitus, type 2)   Bacteremia   Retroperitoneal fluid collection   Fluid collection at surgical site    Time spent: > 35 mins    [facility]  [facility] Pager [phone]. If 7PM-7AM, please contact night-coverage at [email or web address], password [facility] 08/29/2012, 9:50 AM  LOS: 4 days

## 2012-08-30 NOTE — Progress Notes (Signed)
Subjective: Patient and his wife both report Significant relief of low back pain and pain near the inguinal ligament, since placement of percutaneous retroperitoneal drains.  The patient still reports weakness to hip flexion, consistent with the abscess on the so as muscle.  Fluid from the drain placement , as gram-positive cocci in clusters.  The patient reports no pain with internal or external rotation of the hip, but still has significant weakness and pain if he attempts to flex his hip.To recap, yesterday I attempted to get fluid from his right total hip using an 18-gauge  By 3-1/2 inch needle, I deathly made it down to the implant, as the needle touched the metal it only got back 1 or 2 drops of blood with 4 passes. Objective: Vital signs in last 24 hours: Temp:  [97.8 F (36.6 C)-98.3 F (36.8 C)] 98.3 F (36.8 C) (08/05 0600) Pulse Rate:  [82-114] 97 (08/05 0600) Resp:  [11-23] 18 (08/05 0600) BP: (102-142)/(61-90) 137/80 mmHg (08/05 0600) SpO2:  [90 %-100 %] 93 % (08/05 0600)  Intake/Output from previous day: 08/04 0701 - 08/05 0700 In: -  Out: 2195 [Urine:1800; Drains:395] Intake/Output this shift:     Recent Labs  08/28/12 0035 08/29/12 0540 08/30/12 0600  HGB 9.6* 10.2* 10.3*    Recent Labs  08/29/12 0540 08/30/12 0600  WBC 15.4* 13.2*  RBC 3.42* 3.48*  HCT 30.9* 31.3*  PLT 246 252    Recent Labs  08/29/12 0540 08/30/12 0600  NA 137 136  K 3.6 3.7  CL 93* 94*  CO2 34* 32  BUN 31* 28*  CREATININE 1.43* 1.28  GLUCOSE 141* 140*  CALCIUM 8.7 8.8    Recent Labs  08/29/12 0540 08/30/12 0600  INR 2.32* 2.54*    Exam: Passive motion to internal and external rotation.  Flexion and foot tap, reproduces no pain.  Palpation of the hip laterally and especially posteriorly deep does not cause any pain.  Her is tenderness over the inguinal ligament along the tract of the iliopsoas tendon.  The patient does have weakness to attempted flexion.  The skin over the  hip is soft.  Her is no erythema and no redness.  Assessment/Plan: 1.  Successful drainage of retroperitoneal abscess that is growing out staph aureus. 2.  Hybrid right total up arthroplasty placed by me in 2012 with concern of possible secondary infection of the total hip from the retroperitoneal abscess.  Attempted aspiration yesterday, came back dry, and the hip itself is not irritable.  Conversely, the CT scan is consistent with fluid around the hip, but again, 4 passes with an 18-gauge needle by 3-1/2 inch, did not yield any fluid collections.  Plan: I will continue to observe the patient, I have discussed exploration of the hip with both the patient and his wife. Certainly if the hip becomes Painful, irritable swollen or erythematous we will proceed with exploration on an urgent basis.   Lorenso Quirino J 08/30/2012, 8:14 AM

## 2012-08-30 NOTE — Progress Notes (Signed)
[facility] PROGRESS NOTE  [name] ZOX:[id number] DOB: [birth year] DOA: 08/25/2012 PCP: Malka So., MD  Assessment/Plan: #1 MSSA bacteremia Questionable etiology. Preliminary blood cultures were 2 out of 2 gram-positive cocci in clusters. CT of the hip abdomen and pelvis concerning for complex fluid collection of the left hip and possible infected retroperitoneal hematoma. Continue empiric IV cefazolin as per ID recommendations. Repeat blood cultures pending. ID recommending TEE to r/o endocarditis and will be done today per cardiology. May need pacer wires removed. Will defer to cardiology and ID.  ID following and appreciate input and recommendations.  #2 Klebsiella ozaenae UTI Continue IV cefazolin.  #3 Large retroperitoneal complex fluid collection and complex fluid collection of the right hip abscess/hematoma Patient noted to have an INR as high as 7.19 on day of admission. INR has been reversed. CT of the hip and CT of the abdomen and pelvis worrisome for complex fluid collection around the right hip and also retroperitoneal hematoma. Gen. surgery and orthopedics have been consulted. Interventional radiology s/p aspiration and drainage yesterday. Prelim aspiration with GPC in clusters. Continue empiric IV antibiotics. Continue supportive care. Warm compresses. ID/IR/CCS ff. Follow.   #4 right lower extremity weakness and back pain Likely secondary to infected retroperitoneal hematoma and possible complex fluid collection noted around the right hip. Orthopedics has been consulted as general surgery.  #5 acute renal failure/dehydration/hypokalemia Acute renal failure secondary to prerenal azotemia. Renal function improved. Normal saline lock IV fluids. Patient also noted to be in [place] which is the likely etiology of this is hypokalemia. Replete potassium. Continue daily potassium.  #6 supratherapeutic INR Status post vitamin K doses x2. Status post FFP. INR trending  back up. Patient likely has a retroperitoneal hematoma and a such will hold off on anticoagulations for now. Will give another dose of VIt K and follow. Repeat INR in the morning. Follow.  #7 leukocytosis Likely secondary to problems #1 and 2. Urine cultures with greater than 100,000 of Klebsiella ozaenae. Blood cultures with MSSA . Cultures from retroperitoneal hematoma preliminary positive for gram-positive cocci in clusters. Continue empiric IV vancomycin and cefazolin per ID recommendations. ID following and appreciate input and recommendations.  #8 right-sided abdominal tenderness Clinical improvement. Likely secondary to retroperitoneal hematoma. Abdominal ultrasound and abdominal films negative for any acute abnormalities. Abdominal ultrasound positive for hepatic steatosis. Follow. Pain management. Supportive care.  #9 atrial fibrillation Continue beta blocker for rate control. INR has been reversed, but trending back up. Patient is status post permanent pacemaker.  Follow.  #10 normocytic anemia Stable. Follow H&H.  #11 type 2 diabetes Continue sliding scale insulin.  Code Status: Full Family Communication: Updated patient and wife at bedside. Disposition Plan:  Will take over care of the patient from neurosurgery.   Consultants:   infectious diseases: Dr., 08/27/2012  General surgery: Dr. Luisa Hart 08/28/2012  Orthopedic surgery: Dr. Ave Filter 08/28/2012   Cardiology pending  Procedures:  Acute abdominal series 08/26/2012  Chest x-ray 08/26/2012  CT guided aspiration/drainage of right lower pelvic retroperitoneal fluid collection? Hematoma 08/28/2012 per Dr. Fredia Sorrow  TEE  08/30/2012    Antibiotics:  IV vancomycin 08/26/2012--->08/29/12  IV Rocephin 08/26/2012---. 08/27/12  IV Ancef  08/28/2012   HPI/Subjective: Patient states right lower extremity weakness and numbness unchanged. No complaints.  Objective: Filed Vitals:   08/29/12 1905 08/29/12 2200  08/30/12 0200 08/30/12 0600  BP: 125/61 142/64 113/68 137/80  Pulse: 91 87 114 97  Temp: 98.1 F (36.7 C) 98.3 F (36.8 C) 98.2 F (36.8 C)  98.3 F (36.8 C)  TempSrc: Oral     Resp: 18 20 20 18   Height:      Weight:      SpO2: 93% 99% 90% 93%    Intake/Output Summary (Last 24 hours) at 08/30/12 0839 Last data filed at 08/30/12 0600  Gross per 24 hour  Intake      0 ml  Output   2195 ml  Net  -2195 ml   Filed Weights   08/25/12 2128  Weight: 110.723 kg (244 lb 1.6 oz)    Exam:   General:  NAD  Cardiovascular: RRR  Respiratory: CTAB  Abdomen: SOFT/NT/ND/+BS  EXTREMITIES: Right hip tender to palpation. 0-5 right lower extremity strength. 4/5 left lower extremity shin. 5 out of 5 bilateral upper extremity strength.  Data Reviewed: Basic Metabolic Panel:  Recent Labs Lab 08/26/12 1120  08/27/12 0530 08/27/12 1435 08/28/12 0035 08/29/12 0540 08/30/12 0600  NA 134*  < > 137 138 134* 137 136  K 2.7*  < > 2.6* 3.7 3.0* 3.6 3.7  CL 91*  < > 91* 95* 93* 93* 94*  CO2 33*  < > 34* 35* 33* 34* 32  GLUCOSE 139*  < > 153* 152* 160* 141* 140*  BUN 71*  < > 59* 54* 45* 31* 28*  CREATININE 2.51*  < > 1.95* 1.78* 1.51* 1.43* 1.28  CALCIUM 8.6  < > 8.9 8.7 8.5 8.7 8.8  MG 2.7*  --   --   --   --   --   --   < > = values in this interval not displayed. Liver Function Tests:  Recent Labs Lab 08/26/12 1120 08/27/12 0530 08/28/12 0035  AST 192* 117* 82*  ALT 78* 60* 49  ALKPHOS 89 97 94  BILITOT 0.7 1.2 1.3*  PROT 6.3 6.4 6.3  ALBUMIN 1.8* 2.3* 1.9*   No results found for this basename: LIPASE, AMYLASE,  in the last 168 hours No results found for this basename: AMMONIA,  in the last 168 hours CBC:  Recent Labs Lab 08/26/12 0748 08/26/12 0751 08/27/12 0530 08/28/12 0035 08/29/12 0540 08/30/12 0600  WBC  --  23.7* 17.0* 14.4* 15.4* 13.2*  NEUTROABS 20.9*  --  15.3*  --  13.4* 11.2*  HGB  --  10.3* 9.9* 9.6* 10.2* 10.3*  HCT  --  29.7* 28.7* 29.5* 30.9*  31.3*  MCV  --  87.1 88.3 89.7 90.4 89.9  PLT  --  226 218 224 246 252   Cardiac Enzymes: No results found for this basename: CKTOTAL, CKMB, CKMBINDEX, TROPONINI,  in the last 168 hours BNP (last 3 results) No results found for this basename: PROBNP,  in the last 8760 hours CBG:  Recent Labs Lab 08/29/12 0655 08/29/12 1104 08/29/12 1654 08/29/12 2145 08/30/12 0644  GLUCAP 134* 137* 130* 140* 140*    Recent Results (from the past 240 hour(s))  URINE CULTURE     Status: None   Collection Time    08/26/12  7:32 AM      Result Value Range Status   Specimen Description URINE, CLEAN CATCH   Final   Special Requests NONE   Final   Culture  Setup Time 08/26/2012 21:40   Final   Colony Count >=100,000 COLONIES/ML   Final   Culture KLEBSIELLA OZAENAE   Final   Report Status 08/29/2012 FINAL   Final   Organism ID, Bacteria KLEBSIELLA OZAENAE   Final  CULTURE, BLOOD (ROUTINE X 2)  Status: None   Collection Time    08/26/12 11:20 AM      Result Value Range Status   Specimen Description BLOOD LEFT ARM   Final   Special Requests BOTTLES DRAWN AEROBIC AND ANAEROBIC 10CC   Final   Culture  Setup Time 08/26/2012 16:54   Final   Culture     Final   Value: STAPHYLOCOCCUS AUREUS     Note: SUSCEPTIBILITIES PERFORMED ON PREVIOUS CULTURE WITHIN THE LAST 5 DAYS.     2 Note: Gram Stain Report Called to,Read Back By and Verified With: KELSEY [place] RN ON8 14 @420  EDMOJ   Report Status 08/29/2012 FINAL   Final  CULTURE, BLOOD (ROUTINE X 2)     Status: None   Collection Time    08/26/12 11:30 AM      Result Value Range Status   Specimen Description BLOOD LEFT HAND   Final   Special Requests     Final   Value: BOTTLES DRAWN AEROBIC AND ANAEROBIC AERO 10CC ANA 5CC   Culture  Setup Time 08/26/2012 16:54   Final   Culture     Final   Value: STAPHYLOCOCCUS AUREUS     Note: RIFAMPIN AND GENTAMICIN SHOULD NOT BE USED AS SINGLE DRUGS FOR TREATMENT OF STAPH INFECTIONS.     2 Note: Gram Stain  Report Called to,Read Back By and Verified With: KELSEY [place] RN ON8 14 @420  EDMOJ   Report Status 08/29/2012 FINAL   Final   Organism ID, Bacteria STAPHYLOCOCCUS AUREUS   Final  CULTURE, BLOOD (ROUTINE X 2)     Status: None   Collection Time    08/28/12 12:45 AM      Result Value Range Status   Specimen Description BLOOD RIGHT HAND   Final   Special Requests BOTTLES DRAWN AEROBIC AND ANAEROBIC 10CC   Final   Culture  Setup Time 08/28/2012 15:05   Final   Culture     Final   Value:        BLOOD CULTURE RECEIVED NO GROWTH TO DATE CULTURE WILL BE HELD FOR 5 DAYS BEFORE ISSUING A FINAL NEGATIVE REPORT   Report Status PENDING   Incomplete  CULTURE, BLOOD (ROUTINE X 2)     Status: None   Collection Time    08/28/12 12:55 AM      Result Value Range Status   Specimen Description BLOOD RIGHT HAND   Final   Special Requests BOTTLES DRAWN AEROBIC ONLY 10CC   Final   Culture  Setup Time 08/28/2012 15:05   Final   Culture     Final   Value:        BLOOD CULTURE RECEIVED NO GROWTH TO DATE CULTURE WILL BE HELD FOR 5 DAYS BEFORE ISSUING A FINAL NEGATIVE REPORT   Report Status PENDING   Incomplete  CULTURE, ROUTINE-ABSCESS     Status: None   Collection Time    08/29/12  3:01 PM      Result Value Range Status   Specimen Description ABSCESS   Final   Special Requests RIGHT RETROPERITONEAL HEMATOMA   Final   Gram Stain     Final   Value: MODERATE WBC PRESENT, PREDOMINANTLY PMN     NO SQUAMOUS EPITHELIAL CELLS SEEN     MODERATE GRAM POSITIVE COCCI IN CLUSTERS     Performed at [facility]   Culture     Final   Value: Culture reincubated for better growth     Performed at [facility]   Report Status PENDING   Incomplete  ANAEROBIC CULTURE     Status: None   Collection Time    08/29/12  3:01 PM      Result Value Range Status   Specimen Description ABSCESS   Final   Special Requests RIGHT RETROPERITONEAL HEMATOMA   Final   Gram Stain     Final   Value: MODERATE WBC PRESENT,  PREDOMINANTLY PMN     NO SQUAMOUS EPITHELIAL CELLS SEEN     M0 GRAM POSITIVE COCCI     IN PAIRS INCISION     Performed at [facility]   Culture PENDING   Incomplete   Report Status PENDING   Incomplete     Studies: Ct Guided Abscess Drain  08/29/2012   *RADIOLOGY REPORT*  Clinical Data: Right-sided retroperitoneal fluid collections containing air.  CT GUIDED DRAINAGE OF RETROPERITONEAL ABSCESS x 2  Sedation:  2.0 mg IV Versed;  50 mcg IV Fentanyl  Total Moderate Sedation Time: 36 minutes.  Procedure:  The procedure, risks, benefits, and alternatives were explained to the patient.  Questions regarding the procedure were encouraged and answered. The patient understands and consents to the procedure.  The right lateral abdominal wall was prepped with Betadine in a sterile fashion, and a sterile drape was applied covering the operative field.  A sterile gown and sterile gloves were used for the procedure. Local anesthesia was provided with 1% Lidocaine.  CT was performed in a supine position with the right side rolled up slightly.  Under CT guidance, two separate 18 gauge trocar needles were advanced into both anterior and posterior retroperitoneal fluid collections on the right.  Fluid aspiration was performed and a fluid sample sent for culture analysis.  Guide wires were advanced into the two dominant pockets.  Tracts were dilated and 12-French percutaneous drainage catheters placed. Catheter position was confirmed by CT.  The catheters were flushed with saline and connected to suction bulbs.  Both catheters were secured at the skin with Prolene retention sutures and adhesive Stat-Lock devices.  Complications: None  Findings: It did appear that the dominant retroperitoneal fluid collections do not appear to show good communication with two dominant pockets identified.  Aspiration at the level of the anterior dominant pocket revealed liquefied blood which was not grossly turbid.  Aspiration of the  dominant posterior retroperitoneal collection in the iliac fossa yielded dark partially liquefied blood.  12-French drains were placed in both collections.  The more anterior collection shows much more significant initial return of liquefied blood.  The posterior collection is slowly draining after flushing the drainage catheter.  IMPRESSION: Both anterior and posterior dominant right retroperitoneal collections were aspirated and treated with placement of indwelling 12-French drains.  The more anterior collection demonstrated free return of liquefied blood.  The more posterior collection yielded much darker old blood product with slow return.  Both drains were connected to suction bulbs and will be flushed every 8 hours.   Original Report Authenticated By: Irish Lack, M.D.   Ct Guided Abscess Drain  08/29/2012   *RADIOLOGY REPORT*  Clinical Data: Right-sided retroperitoneal fluid collections containing air.  CT GUIDED DRAINAGE OF RETROPERITONEAL ABSCESS x 2  Sedation:  2.0 mg IV Versed;  50 mcg IV Fentanyl  Total Moderate Sedation Time: 36 minutes.  Procedure:  The procedure, risks, benefits, and alternatives were explained to the patient.  Questions regarding the procedure were encouraged and answered. The patient understands and consents to the procedure.  The right lateral abdominal wall was prepped with Betadine in a sterile fashion, and a sterile drape was applied covering the operative field.  A sterile gown and sterile gloves were used for the procedure. Local anesthesia was provided with 1% Lidocaine.  CT was performed in a supine position with the right side rolled up slightly.  Under CT guidance, two separate 18 gauge trocar needles were advanced into both anterior and posterior retroperitoneal fluid collections on the right.  Fluid aspiration was performed and a fluid sample sent for culture analysis.  Guide wires were advanced into the two dominant pockets.  Tracts were dilated and 12-French  percutaneous drainage catheters placed. Catheter position was confirmed by CT.  The catheters were flushed with saline and connected to suction bulbs.  Both catheters were secured at the skin with Prolene retention sutures and adhesive Stat-Lock devices.  Complications: None  Findings: It did appear that the dominant retroperitoneal fluid collections do not appear to show good communication with two dominant pockets identified.  Aspiration at the level of the anterior dominant pocket revealed liquefied blood which was not grossly turbid.  Aspiration of the dominant posterior retroperitoneal collection in the iliac fossa yielded dark partially liquefied blood.  12-French drains were placed in both collections.  The more anterior collection shows much more significant initial return of liquefied blood.  The posterior collection is slowly draining after flushing the drainage catheter.  IMPRESSION: Both anterior and posterior dominant right retroperitoneal collections were aspirated and treated with placement of indwelling 12-French drains.  The more anterior collection demonstrated free return of liquefied blood.  The more posterior collection yielded much darker old blood product with slow return.  Both drains were connected to suction bulbs and will be flushed every 8 hours.   Original Report Authenticated By: Irish Lack, M.D.    Scheduled Meds: .  ceFAZolin (ANCEF) IV  1 g Intravenous Q8H  . finasteride  5 mg Oral Daily  . insulin aspart  0-9 Units Subcutaneous TID WC  . levothyroxine  75 mcg Oral QAC breakfast  . metolazone  2.5 mg Oral Q M,W,F  . metoprolol succinate  12.5 mg Oral Daily  . phytonadione  5 mg Oral Once  . potassium chloride  40 mEq Oral Daily   Continuous Infusions: . sodium chloride      Principal Problem:   Retroperitoneal fluid collection Active Problems:   HTN (hypertension)   CKD (chronic kidney disease), stage III   Atrial fibrillation   Hypothyroidism   HLD  (hyperlipidemia)   ARF (acute renal failure)   Hypokalemia   Leukocytosis, unspecified   UTI (urinary tract infection)   Supratherapeutic INR   DM type 2 (diabetes mellitus, type 2)   Bacteremia   Fluid collection at surgical site    Time spent: > 35 mins    [facility]  [facility] Pager [phone]. If 7PM-7AM, please contact night-coverage at [email or web address], password [facility] 08/30/2012, 8:39 AM  LOS: 5 days

## 2012-08-30 NOTE — Interval H&P Note (Signed)
History and Physical Interval Note:  08/30/2012 1:15 PM  [name]  has presented today for surgery, with the diagnosis of BACTREMIA  The various methods of treatment have been discussed with the patient and family. After consideration of risks, benefits and other options for treatment, the patient has consented to  Procedure(s): TRANSESOPHAGEAL ECHOCARDIOGRAM (TEE) (N/A) as a surgical intervention .  The patient's history has been reviewed, patient examined, no change in status, stable for surgery.  I have reviewed the patient's chart and labs.  Questions were answered to the patient's satisfaction.     Cloy Cozzens [employer]

## 2012-08-30 NOTE — Progress Notes (Signed)
Patient ID: [name], male   DOB: 04/16/1930, 79 y.o.   MRN: [id number]    Subjective: Pt's pain is much better today.  Feeling a bit better overall  Objective: Vital signs in last 24 hours: Temp:  [97.8 F (36.6 C)-98.3 F (36.8 C)] 98.3 F (36.8 C) (08/05 0600) Pulse Rate:  [82-114] 97 (08/05 0600) Resp:  [11-23] 18 (08/05 0600) BP: (102-142)/(61-90) 137/80 mmHg (08/05 0600) SpO2:  [90 %-100 %] 93 % (08/05 0600) Last BM Date: 08/29/12  Intake/Output from previous day: 08/04 0701 - 08/05 0700 In: -  Out: 2195 [Urine:1800; Drains:395] Intake/Output this shift:    PE: Abd: soft, NT, ND, +BS, one JP with cloudy bloody output, the other with thinner serosang output.  Lab Results:   Recent Labs  08/29/12 0540 08/30/12 0600  WBC 15.4* 13.2*  HGB 10.2* 10.3*  HCT 30.9* 31.3*  PLT 246 252   BMET  Recent Labs  08/29/12 0540 08/30/12 0600  NA 137 136  K 3.6 3.7  CL 93* 94*  CO2 34* 32  GLUCOSE 141* 140*  BUN 31* 28*  CREATININE 1.43* 1.28  CALCIUM 8.7 8.8   PT/INR  Recent Labs  08/29/12 0540 08/30/12 0600  LABPROT 24.7* 26.5*  INR 2.32* 2.54*   CMP     Component Value Date/Time   NA 136 08/30/2012 0600   NA 143 03/21/2012 1426   K 3.7 08/30/2012 0600   K 4.0 03/21/2012 1426   CL 94* 08/30/2012 0600   CL 101 03/21/2012 1426   CO2 32 08/30/2012 0600   CO2 31* 03/21/2012 1426   GLUCOSE 140* 08/30/2012 0600   GLUCOSE 123* 03/21/2012 1426   BUN 28* 08/30/2012 0600   BUN 43.7 Repeated and Verified* 03/21/2012 1426   CREATININE 1.28 08/30/2012 0600   CREATININE 1.9 Repeated and Verified* 03/21/2012 1426   CALCIUM 8.8 08/30/2012 0600   CALCIUM 9.3 03/21/2012 1426   PROT 6.3 08/28/2012 0035   PROT 6.9 03/21/2012 1426   ALBUMIN 1.9* 08/28/2012 0035   ALBUMIN 3.2* 03/21/2012 1426   AST 82* 08/28/2012 0035   AST 18 03/21/2012 1426   ALT 49 08/28/2012 0035   ALT 15 03/21/2012 1426   ALKPHOS 94 08/28/2012 0035   ALKPHOS 74 03/21/2012 1426   BILITOT 1.3* 08/28/2012 0035   BILITOT 0.42  03/21/2012 1426   GFRNONAA 51* 08/30/2012 0600   GFRAA 59* 08/30/2012 0600   Lipase  No results found for this basename: lipase       Studies/Results: Ct Guided Abscess Drain  08/29/2012   *RADIOLOGY REPORT*  Clinical Data: Right-sided retroperitoneal fluid collections containing air.  CT GUIDED DRAINAGE OF RETROPERITONEAL ABSCESS x 2  Sedation:  2.0 mg IV Versed;  50 mcg IV Fentanyl  Total Moderate Sedation Time: 36 minutes.  Procedure:  The procedure, risks, benefits, and alternatives were explained to the patient.  Questions regarding the procedure were encouraged and answered. The patient understands and consents to the procedure.  The right lateral abdominal wall was prepped with Betadine in a sterile fashion, and a sterile drape was applied covering the operative field.  A sterile gown and sterile gloves were used for the procedure. Local anesthesia was provided with 1% Lidocaine.  CT was performed in a supine position with the right side rolled up slightly.  Under CT guidance, two separate 18 gauge trocar needles were advanced into both anterior and posterior retroperitoneal fluid collections on the right.  Fluid aspiration was performed and a fluid sample  sent for culture analysis.  Guide wires were advanced into the two dominant pockets.  Tracts were dilated and 12-French percutaneous drainage catheters placed. Catheter position was confirmed by CT.  The catheters were flushed with saline and connected to suction bulbs.  Both catheters were secured at the skin with Prolene retention sutures and adhesive Stat-Lock devices.  Complications: None  Findings: It did appear that the dominant retroperitoneal fluid collections do not appear to show good communication with two dominant pockets identified.  Aspiration at the level of the anterior dominant pocket revealed liquefied blood which was not grossly turbid.  Aspiration of the dominant posterior retroperitoneal collection in the iliac fossa yielded dark  partially liquefied blood.  12-French drains were placed in both collections.  The more anterior collection shows much more significant initial return of liquefied blood.  The posterior collection is slowly draining after flushing the drainage catheter.  IMPRESSION: Both anterior and posterior dominant right retroperitoneal collections were aspirated and treated with placement of indwelling 12-French drains.  The more anterior collection demonstrated free return of liquefied blood.  The more posterior collection yielded much darker old blood product with slow return.  Both drains were connected to suction bulbs and will be flushed every 8 hours.   Original Report Authenticated By: Irish Lack, M.D.   Ct Guided Abscess Drain  08/29/2012   *RADIOLOGY REPORT*  Clinical Data: Right-sided retroperitoneal fluid collections containing air.  CT GUIDED DRAINAGE OF RETROPERITONEAL ABSCESS x 2  Sedation:  2.0 mg IV Versed;  50 mcg IV Fentanyl  Total Moderate Sedation Time: 36 minutes.  Procedure:  The procedure, risks, benefits, and alternatives were explained to the patient.  Questions regarding the procedure were encouraged and answered. The patient understands and consents to the procedure.  The right lateral abdominal wall was prepped with Betadine in a sterile fashion, and a sterile drape was applied covering the operative field.  A sterile gown and sterile gloves were used for the procedure. Local anesthesia was provided with 1% Lidocaine.  CT was performed in a supine position with the right side rolled up slightly.  Under CT guidance, two separate 18 gauge trocar needles were advanced into both anterior and posterior retroperitoneal fluid collections on the right.  Fluid aspiration was performed and a fluid sample sent for culture analysis.  Guide wires were advanced into the two dominant pockets.  Tracts were dilated and 12-French percutaneous drainage catheters placed. Catheter position was confirmed by CT.  The  catheters were flushed with saline and connected to suction bulbs.  Both catheters were secured at the skin with Prolene retention sutures and adhesive Stat-Lock devices.  Complications: None  Findings: It did appear that the dominant retroperitoneal fluid collections do not appear to show good communication with two dominant pockets identified.  Aspiration at the level of the anterior dominant pocket revealed liquefied blood which was not grossly turbid.  Aspiration of the dominant posterior retroperitoneal collection in the iliac fossa yielded dark partially liquefied blood.  12-French drains were placed in both collections.  The more anterior collection shows much more significant initial return of liquefied blood.  The posterior collection is slowly draining after flushing the drainage catheter.  IMPRESSION: Both anterior and posterior dominant right retroperitoneal collections were aspirated and treated with placement of indwelling 12-French drains.  The more anterior collection demonstrated free return of liquefied blood.  The more posterior collection yielded much darker old blood product with slow return.  Both drains were connected to suction bulbs  and will be flushed every 8 hours.   Original Report Authenticated By: Irish Lack, M.D.    Anti-infectives: Anti-infectives   Start     Dose/Rate Route Frequency Ordered Stop   08/28/12 1400  ceFAZolin (ANCEF) IVPB 1 g/50 mL premix     1 g 100 mL/hr over 30 Minutes Intravenous 3 times per day 08/28/12 1136     08/28/12 0400  vancomycin (VANCOCIN) 1,500 mg in sodium chloride 0.9 % 500 mL IVPB  Status:  Discontinued     1,500 mg 250 mL/hr over 120 Minutes Intravenous Every 24 hours 08/27/12 0431 08/29/12 1425   08/27/12 0445  vancomycin (VANCOCIN) 2,000 mg in sodium chloride 0.9 % 500 mL IVPB     2,000 mg 250 mL/hr over 120 Minutes Intravenous  Once 08/27/12 0431 08/27/12 0722   08/26/12 1000  cefTRIAXone (ROCEPHIN) 1 g in dextrose 5 % 50 mL IVPB   Status:  Discontinued     1 g 100 mL/hr over 30 Minutes Intravenous Every 24 hours 08/26/12 0913 08/27/12 1610       Assessment/Plan  1. RTP hematoma, likely infected, cx showing G+ cocci 2. MSSA bacteremia  Plan: 1. Await final culture and sensitivity, but suspect MSSA in blood is likely culprit in hematoma as well.  Defer abx therapy and duration to ID. 2. Cont JP drains.  3. Would recommend getting INR back down as it is creeping back up again to 2.5.  LOS: 5 days    Brysyn Brandenberger E 08/30/2012, 8:28 AM Pager: [phone]

## 2012-08-31 ENCOUNTER — Encounter (HOSPITAL_COMMUNITY)
Admission: AD | Disposition: A | Discharge status: Skilled Nursing Facility | Payer: Self-pay | Source: Ambulatory Visit | Attending: Internal Medicine

## 2012-08-31 ENCOUNTER — Inpatient Hospital Stay (HOSPITAL_COMMUNITY): Payer: Medicare Other | Admitting: Certified Registered"

## 2012-08-31 ENCOUNTER — Encounter (HOSPITAL_COMMUNITY): Payer: Self-pay | Admitting: Certified Registered"

## 2012-08-31 ENCOUNTER — Other Ambulatory Visit: Payer: Self-pay

## 2012-08-31 DIAGNOSIS — T827XXA Infection and inflammatory reaction due to other cardiac and vascular devices, implants and grafts, initial encounter: Secondary | ICD-10-CM

## 2012-08-31 HISTORY — PX: ICD LEAD REMOVAL: SHX5855

## 2012-08-31 LAB — GLUCOSE, CAPILLARY
Glucose-Capillary: 96 mg/dL (ref 70–99)
Glucose-Capillary: 116 mg/dL — ABNORMAL HIGH (ref 70–99)
Glucose-Capillary: 127 mg/dL — ABNORMAL HIGH (ref 70–99)

## 2012-08-31 SURGERY — REMOVAL, ELECTRODE LEAD, ICD
Anesthesia: General | Site: Chest | Laterality: Left | Wound class: Clean

## 2012-08-31 MED ORDER — ROCURONIUM BROMIDE 100 MG/10ML IV SOLN
INTRAVENOUS | Status: DC | PRN
  Administered 2012-08-31: 50 mg via INTRAVENOUS

## 2012-08-31 MED ORDER — CHLORHEXIDINE GLUCONATE 4 % EX LIQD
60.0000 mL | Freq: Once | CUTANEOUS | Status: AC
  Administered 2012-08-31: 4 via TOPICAL
  Filled 2012-08-31: qty 60

## 2012-08-31 MED ORDER — CHLORHEXIDINE GLUCONATE 4 % EX LIQD
60.0000 mL | Freq: Once | CUTANEOUS | Status: DC
  Filled 2012-08-31: qty 60

## 2012-08-31 MED ORDER — LIDOCAINE HCL (CARDIAC) 20 MG/ML IV SOLN
INTRAVENOUS | Status: DC | PRN
  Administered 2012-08-31: 30 mg via INTRAVENOUS

## 2012-08-31 MED ORDER — ONDANSETRON HCL 4 MG/2ML IJ SOLN
INTRAMUSCULAR | Status: DC | PRN
  Administered 2012-08-31: 4 mg via INTRAVENOUS

## 2012-08-31 MED ORDER — CEFAZOLIN SODIUM-DEXTROSE 2-3 GM-% IV SOLR
2.0000 g | INTRAVENOUS | Status: AC
  Administered 2012-08-31: 2 g via INTRAVENOUS
  Filled 2012-08-31: qty 50

## 2012-08-31 MED ORDER — ONDANSETRON HCL 4 MG/2ML IJ SOLN
4.0000 mg | Freq: Four times a day (QID) | INTRAMUSCULAR | Status: DC | PRN
  Administered 2012-09-03 – 2012-09-04 (×2): 4 mg via INTRAVENOUS
  Filled 2012-08-31 (×2): qty 2

## 2012-08-31 MED ORDER — ACETAMINOPHEN 325 MG PO TABS: 325.0000 mg | ORAL_TABLET | ORAL | Status: DC | PRN

## 2012-08-31 MED ORDER — FENTANYL CITRATE 0.05 MG/ML IJ SOLN
INTRAMUSCULAR | Status: DC | PRN
  Administered 2012-08-31 (×2): 50 ug via INTRAVENOUS
  Administered 2012-08-31: 150 ug via INTRAVENOUS

## 2012-08-31 MED ORDER — FENTANYL CITRATE 0.05 MG/ML IJ SOLN: 25.0000 ug | INTRAMUSCULAR | Status: DC | PRN

## 2012-08-31 MED ORDER — SODIUM CHLORIDE 0.9 % IV SOLN: INTRAVENOUS | Status: DC

## 2012-08-31 MED ORDER — NEOSTIGMINE METHYLSULFATE 1 MG/ML IJ SOLN
INTRAMUSCULAR | Status: DC | PRN
  Administered 2012-08-31: 3 mg via INTRAVENOUS

## 2012-08-31 MED ORDER — LACTATED RINGERS IV SOLN
INTRAVENOUS | Status: DC | PRN
  Administered 2012-08-31: 08:00:00 via INTRAVENOUS

## 2012-08-31 MED ORDER — SODIUM CHLORIDE 0.9 % IR SOLN
80.0000 mg | Status: DC
  Filled 2012-08-31: qty 2

## 2012-08-31 MED ORDER — DROPERIDOL 2.5 MG/ML IJ SOLN: 0.6250 mg | INTRAMUSCULAR | Status: DC | PRN

## 2012-08-31 MED ORDER — RIFAMPIN 300 MG PO CAPS
300.0000 mg | ORAL_CAPSULE | Freq: Two times a day (BID) | ORAL | Status: DC
  Administered 2012-09-01 – 2012-09-08 (×14): 300 mg via ORAL
  Filled 2012-08-31 (×17): qty 1

## 2012-08-31 MED ORDER — PROPOFOL 10 MG/ML IV BOLUS
INTRAVENOUS | Status: DC | PRN
  Administered 2012-08-31: 80 mg via INTRAVENOUS

## 2012-08-31 MED ORDER — SODIUM CHLORIDE 0.9 % IR SOLN
Status: DC | PRN
  Administered 2012-08-31: 10:00:00

## 2012-08-31 MED ORDER — GLYCOPYRROLATE 0.2 MG/ML IJ SOLN
INTRAMUSCULAR | Status: DC | PRN
  Administered 2012-08-31: 0.4 mg via INTRAVENOUS

## 2012-08-31 SURGICAL SUPPLY — 45 items
APL SKNCLS STERI-STRIP NONHPOA (GAUZE/BANDAGES/DRESSINGS) ×1
BAG BANDED W/RUBBER/TAPE 36X54 (MISCELLANEOUS) ×1 IMPLANT
BAG DECANTER FOR FLEXI CONT (MISCELLANEOUS) ×4 IMPLANT
BAG EQP BAND 135X91 W/RBR TAPE (MISCELLANEOUS) ×1
BENZOIN TINCTURE PRP APPL 2/3 (GAUZE/BANDAGES/DRESSINGS) ×1 IMPLANT
BLADE STERNUM SYSTEM 6 (BLADE) ×1 IMPLANT
BLADE SURG ROTATE 9660 (MISCELLANEOUS) ×1 IMPLANT
BNDG ADH 5X4 AIR PERM ELC (GAUZE/BANDAGES/DRESSINGS) ×1
BNDG COHESIVE 4X5 WHT NS (GAUZE/BANDAGES/DRESSINGS) ×2 IMPLANT
CANISTER SUCTION 2500CC (MISCELLANEOUS) ×2 IMPLANT
CLOTH BEACON ORANGE TIMEOUT ST (SAFETY) ×2 IMPLANT
CLSR STERI-STRIP ANTIMIC 1/2X4 (GAUZE/BANDAGES/DRESSINGS) ×1 IMPLANT
COVER TABLE BACK 60X90 (DRAPES) ×2 IMPLANT
DRAPE C-ARM 42X72 X-RAY (DRAPES) IMPLANT
DRAPE CARDIOVASCULAR INCISE (DRAPES) ×2
DRAPE INCISE IOBAN 66X45 STRL (DRAPES) ×1 IMPLANT
DRAPE PROXIMA HALF (DRAPES) ×4 IMPLANT
DRAPE SRG 135X102X78XABS (DRAPES) ×1 IMPLANT
ELECT REM PT RETURN 9FT ADLT (ELECTROSURGICAL) ×4
ELECTRODE REM PT RTRN 9FT ADLT (ELECTROSURGICAL) ×2 IMPLANT
GAUZE PACKING IODOFORM 1 (PACKING) IMPLANT
GAUZE SPONGE 4X4 16PLY XRAY LF (GAUZE/BANDAGES/DRESSINGS) IMPLANT
GLOVE BIO SURGEON STRL SZ7.5 (GLOVE) ×1 IMPLANT
GLOVE BIOGEL PI IND STRL 6.5 (GLOVE) IMPLANT
GLOVE BIOGEL PI IND STRL 7.5 (GLOVE) ×1 IMPLANT
GLOVE BIOGEL PI INDICATOR 6.5 (GLOVE) ×1
GLOVE BIOGEL PI INDICATOR 7.5 (GLOVE) ×1
GLOVE ECLIPSE 8.0 STRL XLNG CF (GLOVE) ×2 IMPLANT
GOWN PREVENTION PLUS XLARGE (GOWN DISPOSABLE) ×2 IMPLANT
GOWN STRL NON-REIN LRG LVL3 (GOWN DISPOSABLE) ×2 IMPLANT
KIT ROOM TURNOVER OR (KITS) ×2 IMPLANT
NS IRRIG 1000ML POUR BTL (IV SOLUTION) IMPLANT
PAD ARMBOARD 7.5X6 YLW CONV (MISCELLANEOUS) ×4 IMPLANT
PAD ELECT DEFIB RADIOL ZOLL (MISCELLANEOUS) ×2 IMPLANT
SPONGE GAUZE 4X4 12PLY (GAUZE/BANDAGES/DRESSINGS) ×2 IMPLANT
SUT PROLENE 2 0 CT2 30 (SUTURE) ×4 IMPLANT
SUT VIC AB 2-0 CT2 18 VCP726D (SUTURE) ×2 IMPLANT
SUT VIC AB 3-0 SH 27 (SUTURE) ×2
SUT VIC AB 3-0 SH 27X BRD (SUTURE) IMPLANT
SUT VIC AB 3-0 X1 27 (SUTURE) ×1 IMPLANT
TOWEL OR 17X24 6PK STRL BLUE (TOWEL DISPOSABLE) ×4 IMPLANT
TOWEL OR 17X26 10 PK STRL BLUE (TOWEL DISPOSABLE) ×4 IMPLANT
TRAY FOLEY IC TEMP SENS 14FR (CATHETERS) ×2 IMPLANT
TUBE CONNECTING 12X1/4 (SUCTIONS) ×2 IMPLANT
YANKAUER SUCT BULB TIP NO VENT (SUCTIONS) ×2 IMPLANT

## 2012-08-31 NOTE — Progress Notes (Signed)
Patient ID: [name], male   DOB: 1930-12-05, 79 y.o.   MRN: [id number] Subjective:  Denies chest pain or sob. I did not sleep well last night.  Objective:  Vital Signs in the last 24 hours: Temp:  [97.5 F (36.4 C)-98.6 F (37 C)] 98.6 F (37 C) (08/06 0532) Pulse Rate:  [64-105] 96 (08/06 0532) Resp:  [9-34] 20 (08/06 0532) BP: (103-180)/(59-90) 145/74 mmHg (08/06 0532) SpO2:  [90 %-100 %] 98 % (08/06 0532)  Intake/Output from previous day: 08/05 0701 - 08/06 0700 In: -  Out: 95 [Drains:95] Intake/Output from this shift:    Physical Exam: stable appearing NAD HEENT: Unremarkable Neck:  7 cm JVD, no thyromegally Back:  No CVA tenderness Lungs:  Clear with no wheezes HEART:  Regular rate rhythm, no murmurs, no rubs, no clicks Abd:  obese, positive bowel sounds, no organomegally, no rebound, no guarding Ext:  2 plus pulses, no edema, no cyanosis, no clubbing Skin:  No rashes no nodules Neuro:  CN II through XII intact, motor grossly intact  Lab Results:  Recent Labs  08/29/12 0540 08/30/12 0600  WBC 15.4* 13.2*  HGB 10.2* 10.3*  PLT 246 252    Recent Labs  08/29/12 0540 08/30/12 0600  NA 137 136  K 3.6 3.7  CL 93* 94*  CO2 34* 32  GLUCOSE 141* 140*  BUN 31* 28*  CREATININE 1.43* 1.28   No results found for this basename: TROPONINI, CK, MB,  in the last 72 hours Hepatic Function Panel No results found for this basename: PROT, ALBUMIN, AST, ALT, ALKPHOS, BILITOT, BILIDIR, IBILI,  in the last 72 hours No results found for this basename: CHOL,  in the last 72 hours No results found for this basename: PROTIME,  in the last 72 hours  Imaging: Ct Image Guided Fluid Drain By Catheter  08/29/2012   *RADIOLOGY REPORT*  Clinical Data: Right-sided retroperitoneal fluid collections containing air.  CT GUIDED DRAINAGE OF RETROPERITONEAL ABSCESS x 2  Sedation:  2.0 mg IV Versed;  50 mcg IV Fentanyl  Total Moderate Sedation Time: 36 minutes.  Procedure:  The  procedure, risks, benefits, and alternatives were explained to the patient.  Questions regarding the procedure were encouraged and answered. The patient understands and consents to the procedure.  The right lateral abdominal wall was prepped with Betadine in a sterile fashion, and a sterile drape was applied covering the operative field.  A sterile gown and sterile gloves were used for the procedure. Local anesthesia was provided with 1% Lidocaine.  CT was performed in a supine position with the right side rolled up slightly.  Under CT guidance, two separate 18 gauge trocar needles were advanced into both anterior and posterior retroperitoneal fluid collections on the right.  Fluid aspiration was performed and a fluid sample sent for culture analysis.  Guide wires were advanced into the two dominant pockets.  Tracts were dilated and 12-French percutaneous drainage catheters placed. Catheter position was confirmed by CT.  The catheters were flushed with saline and connected to suction bulbs.  Both catheters were secured at the skin with Prolene retention sutures and adhesive Stat-Lock devices.  Complications: None  Findings: It did appear that the dominant retroperitoneal fluid collections do not appear to show good communication with two dominant pockets identified.  Aspiration at the level of the anterior dominant pocket revealed liquefied blood which was not grossly turbid.  Aspiration of the dominant posterior retroperitoneal collection in the iliac fossa yielded dark partially liquefied blood.  12-French drains were placed in both collections.  The more anterior collection shows much more significant initial return of liquefied blood.  The posterior collection is slowly draining after flushing the drainage catheter.  IMPRESSION: Both anterior and posterior dominant right retroperitoneal collections were aspirated and treated with placement of indwelling 12-French drains.  The more anterior collection  demonstrated free return of liquefied blood.  The more posterior collection yielded much darker old blood product with slow return.  Both drains were connected to suction bulbs and will be flushed every 8 hours.   Original Report Authenticated By: Irish Lack, M.D.   Ct Image Guided Fluid Drain By Catheter  08/29/2012   *RADIOLOGY REPORT*  Clinical Data: Right-sided retroperitoneal fluid collections containing air.  CT GUIDED DRAINAGE OF RETROPERITONEAL ABSCESS x 2  Sedation:  2.0 mg IV Versed;  50 mcg IV Fentanyl  Total Moderate Sedation Time: 36 minutes.  Procedure:  The procedure, risks, benefits, and alternatives were explained to the patient.  Questions regarding the procedure were encouraged and answered. The patient understands and consents to the procedure.  The right lateral abdominal wall was prepped with Betadine in a sterile fashion, and a sterile drape was applied covering the operative field.  A sterile gown and sterile gloves were used for the procedure. Local anesthesia was provided with 1% Lidocaine.  CT was performed in a supine position with the right side rolled up slightly.  Under CT guidance, two separate 18 gauge trocar needles were advanced into both anterior and posterior retroperitoneal fluid collections on the right.  Fluid aspiration was performed and a fluid sample sent for culture analysis.  Guide wires were advanced into the two dominant pockets.  Tracts were dilated and 12-French percutaneous drainage catheters placed. Catheter position was confirmed by CT.  The catheters were flushed with saline and connected to suction bulbs.  Both catheters were secured at the skin with Prolene retention sutures and adhesive Stat-Lock devices.  Complications: None  Findings: It did appear that the dominant retroperitoneal fluid collections do not appear to show good communication with two dominant pockets identified.  Aspiration at the level of the anterior dominant pocket revealed liquefied  blood which was not grossly turbid.  Aspiration of the dominant posterior retroperitoneal collection in the iliac fossa yielded dark partially liquefied blood.  12-French drains were placed in both collections.  The more anterior collection shows much more significant initial return of liquefied blood.  The posterior collection is slowly draining after flushing the drainage catheter.  IMPRESSION: Both anterior and posterior dominant right retroperitoneal collections were aspirated and treated with placement of indwelling 12-French drains.  The more anterior collection demonstrated free return of liquefied blood.  The more posterior collection yielded much darker old blood product with slow return.  Both drains were connected to suction bulbs and will be flushed every 8 hours.   Original Report Authenticated By: Irish Lack, M.D.    Cardiac Studies: Tele - atrial fibrillation with a controlled VR Assessment/Plan:  1. Staph aureus bacteremia 2. S/p PPM 2008 3. Chronic atrial fibrillation 4. Chronic coumadin therapy, s/p vit k Rec: await inr. Will proceed with lead extraction of infected pacing system later this morning pending INR.   LOS: 6 days    Shiniqua Groseclose,m.d. 08/31/2012, 7:04 AM

## 2012-08-31 NOTE — Progress Notes (Signed)
Patient returned from procedure to room 4N28 at 12N via bed. MD was in room to assess patient and wife at bedside. Patient in stable condition.

## 2012-08-31 NOTE — Progress Notes (Addendum)
ANTIBIOTIC CONSULT NOTE - FOLLOW UP  Pharmacy Consult for cefazolin Indication: MSSA bacteremia, K. ozaenae UTI  No Known Allergies  Patient Measurements: Height: 5' 11.5" (181.6 cm) Weight: 244 lb 1.6 oz (110.723 kg) IBW/kg (Calculated) : 76.45  Vital Signs: Temp: 98.1 F (36.7 C) (08/06 1415) Temp src: Oral (08/06 1415) BP: 138/45 mmHg (08/06 1415) Pulse Rate: 92 (08/06 1415) Intake/Output from previous day: 08/05 0701 - 08/06 0700 In: -  Out: 95 [Drains:95] Intake/Output from this shift: Total I/O In: 600 [I.V.:600] Out: 403 [Urine:325; Drains:73; Blood:5]  Labs:  Recent Labs  08/29/12 0540 08/30/12 0600  WBC 15.4* 13.2*  HGB 10.2* 10.3*  PLT 246 252  CREATININE 1.43* 1.28   Estimated Creatinine Clearance: 58.7 ml/min (by C-G formula based on Cr of 1.28). No results found for this basename: VANCOTROUGH, Leodis Binet, VANCORANDOM, GENTTROUGH, GENTPEAK, GENTRANDOM, TOBRATROUGH, TOBRAPEAK, TOBRARND, AMIKACINPEAK, AMIKACINTROU, AMIKACIN,  in the last 72 hours   Microbiology: Recent Results (from the past 720 hour(s))  URINE CULTURE     Status: None   Collection Time    08/26/12  7:32 AM      Result Value Range Status   Specimen Description URINE, CLEAN CATCH   Final   Special Requests NONE   Final   Culture  Setup Time 08/26/2012 21:40   Final   Colony Count >=100,000 COLONIES/ML   Final   Culture KLEBSIELLA OZAENAE   Final   Report Status 08/29/2012 FINAL   Final   Organism ID, Bacteria KLEBSIELLA OZAENAE   Final  CULTURE, BLOOD (ROUTINE X 2)     Status: None   Collection Time    08/26/12 11:20 AM      Result Value Range Status   Specimen Description BLOOD LEFT ARM   Final   Special Requests BOTTLES DRAWN AEROBIC AND ANAEROBIC 10CC   Final   Culture  Setup Time 08/26/2012 16:54   Final   Culture     Final   Value: STAPHYLOCOCCUS AUREUS     Note: SUSCEPTIBILITIES PERFORMED ON PREVIOUS CULTURE WITHIN THE LAST 5 DAYS.     2 Note: Gram Stain Report Called  to,Read Back By and Verified With: KELSEY [place] RN ON8 14 @420  EDMOJ   Report Status 08/29/2012 FINAL   Final  CULTURE, BLOOD (ROUTINE X 2)     Status: None   Collection Time    08/26/12 11:30 AM      Result Value Range Status   Specimen Description BLOOD LEFT HAND   Final   Special Requests     Final   Value: BOTTLES DRAWN AEROBIC AND ANAEROBIC AERO 10CC ANA 5CC   Culture  Setup Time 08/26/2012 16:54   Final   Culture     Final   Value: STAPHYLOCOCCUS AUREUS     Note: RIFAMPIN AND GENTAMICIN SHOULD NOT BE USED AS SINGLE DRUGS FOR TREATMENT OF STAPH INFECTIONS.     2 Note: Gram Stain Report Called to,Read Back By and Verified With: KELSEY [place] RN ON8 14 @420  EDMOJ   Report Status 08/29/2012 FINAL   Final   Organism ID, Bacteria STAPHYLOCOCCUS AUREUS   Final  CULTURE, BLOOD (ROUTINE X 2)     Status: None   Collection Time    08/28/12 12:45 AM      Result Value Range Status   Specimen Description BLOOD RIGHT HAND   Final   Special Requests BOTTLES DRAWN AEROBIC AND ANAEROBIC 10CC   Final   Culture  Setup Time 08/28/2012 15:05  Final   Culture     Final   Value:        BLOOD CULTURE RECEIVED NO GROWTH TO DATE CULTURE WILL BE HELD FOR 5 DAYS BEFORE ISSUING A FINAL NEGATIVE REPORT   Report Status PENDING   Incomplete  CULTURE, BLOOD (ROUTINE X 2)     Status: None   Collection Time    08/28/12 12:55 AM      Result Value Range Status   Specimen Description BLOOD RIGHT HAND   Final   Special Requests BOTTLES DRAWN AEROBIC ONLY 10CC   Final   Culture  Setup Time 08/28/2012 15:05   Final   Culture     Final   Value:        BLOOD CULTURE RECEIVED NO GROWTH TO DATE CULTURE WILL BE HELD FOR 5 DAYS BEFORE ISSUING A FINAL NEGATIVE REPORT   Report Status PENDING   Incomplete  CULTURE, ROUTINE-ABSCESS     Status: None   Collection Time    08/29/12  3:01 PM      Result Value Range Status   Specimen Description ABSCESS   Final   Special Requests RIGHT RETROPERITONEAL HEMATOMA   Final    Gram Stain     Final   Value: MODERATE WBC PRESENT, PREDOMINANTLY PMN     NO SQUAMOUS EPITHELIAL CELLS SEEN     MODERATE GRAM POSITIVE COCCI IN CLUSTERS     Performed at [facility]   Culture     Final   Value: ABUNDANT STAPHYLOCOCCUS AUREUS     Note: RIFAMPIN AND GENTAMICIN SHOULD NOT BE USED AS SINGLE DRUGS FOR TREATMENT OF STAPH INFECTIONS.     Performed at [facility]   Report Status PENDING   Incomplete  ANAEROBIC CULTURE     Status: None   Collection Time    08/29/12  3:01 PM      Result Value Range Status   Specimen Description ABSCESS   Final   Special Requests RIGHT RETROPERITONEAL HEMATOMA   Final   Gram Stain     Final   Value: MODERATE WBC PRESENT, PREDOMINANTLY PMN     NO SQUAMOUS EPITHELIAL CELLS SEEN     M0 GRAM POSITIVE COCCI     IN PAIRS INCISION     Performed at [facility]   Culture     Final   Value: NO ANAEROBES ISOLATED; CULTURE IN PROGRESS FOR 5 DAYS     Performed at [facility]   Report Status PENDING   Incomplete  SURGICAL PCR SCREEN     Status: None   Collection Time    08/31/12  7:00 AM      Result Value Range Status   MRSA, PCR NEGATIVE  NEGATIVE Final   Staphylococcus aureus NEGATIVE  NEGATIVE Final   Comment:            The Xpert SA Assay (FDA     approved for NASAL specimens     in patients over 67 years of age),     is one component of     a comprehensive surveillance     program.  Test performance has     been validated by [employer] for patients greater     than or equal to 75 year old.     It is not intended     to diagnose infection nor to     guide or monitor treatment.    Anti-infectives  Start     Dose/Rate Route Frequency Ordered Stop   08/31/12 0930  gentamicin (GARAMYCIN) 80 mg in sodium chloride irrigation 0.9 % 500 mL irrigation  Status:  Discontinued       As needed 08/31/12 0931 08/31/12 1004   08/31/12 0630  gentamicin (GARAMYCIN) 80 mg in sodium chloride irrigation 0.9 % 500 mL  irrigation  Status:  Discontinued     80 mg Irrigation On call 08/31/12 0627 08/31/12 1149   08/31/12 0630  ceFAZolin (ANCEF) IVPB 2 g/50 mL premix     2 g 100 mL/hr over 30 Minutes Intravenous On call 08/31/12 0627 08/31/12 0913   08/28/12 1400  ceFAZolin (ANCEF) IVPB 1 g/50 mL premix     1 g 100 mL/hr over 30 Minutes Intravenous 3 times per day 08/28/12 1136     08/28/12 0400  vancomycin (VANCOCIN) 1,500 mg in sodium chloride 0.9 % 500 mL IVPB  Status:  Discontinued     1,500 mg 250 mL/hr over 120 Minutes Intravenous Every 24 hours 08/27/12 0431 08/29/12 1425   08/27/12 0445  vancomycin (VANCOCIN) 2,000 mg in sodium chloride 0.9 % 500 mL IVPB     2,000 mg 250 mL/hr over 120 Minutes Intravenous  Once 08/27/12 0431 08/27/12 0722   08/26/12 1000  cefTRIAXone (ROCEPHIN) 1 g in dextrose 5 % 50 mL IVPB  Status:  Discontinued     1 g 100 mL/hr over 30 Minutes Intravenous Every 24 hours 08/26/12 0913 08/27/12 1610      Assessment: 79 y/o male on day 4 cefazolin for MSSA bacteremia and K. Ozaenae UTI. Patient also has GPC growing from R retroperitoneal hematoma culture. He is afebrile and WBC are elevated. SCr has trended down improving renal function. PPM extraction was done today.  Goal of Therapy:  Eradication of infection  Plan:  -Continue cefazolin 1 g IV q8h -Monitor renal function, clinical progress, and culture data  [facility], [address].D., BCPS Clinical Pharmacist Pager: [phone] 08/31/2012 2:21 PM

## 2012-08-31 NOTE — Anesthesia Preprocedure Evaluation (Addendum)
Anesthesia Evaluation  Patient identified by MRN, date of birth, ID band Patient awake    Reviewed: Allergy & Precautions, H&P , NPO status , Patient's Chart, lab work & pertinent test results, reviewed documented beta blocker date and time   History of Anesthesia Complications Negative for: history of anesthetic complications  Airway Mallampati: II TM Distance: >3 FB Neck ROM: Full    Dental  (+) Edentulous Upper and Dental Advisory Given   Pulmonary neg pulmonary ROS,  breath sounds clear to auscultation  Pulmonary exam normal       Cardiovascular hypertension, Pt. on medications and Pt. on home beta blockers + dysrhythmias (coumadin) Atrial Fibrillation + pacemaker Rate:Normal  8/14 TEE: EF 55-60%, valves OK   Neuro/Psych negative neurological ROS     GI/Hepatic negative GI ROS, Neg liver ROS,   Endo/Other  diabetes (glu 107), Well Controlled, Type 2, Oral Hypoglycemic AgentsHypothyroidism Morbid obesity  Renal/GU Renal InsufficiencyRenal disease (creat 1.28)     Musculoskeletal   Abdominal (+) + obese,   Peds  Hematology  (+) Blood dyscrasia (Hb 10.3), anemia ,   Anesthesia Other Findings   Reproductive/Obstetrics                        Anesthesia Physical Anesthesia Plan  ASA: III  Anesthesia Plan: General   Post-op Pain Management:    Induction: Intravenous  Airway Management Planned: Oral ETT  Additional Equipment: Arterial line  Intra-op Plan:   Post-operative Plan: Extubation in OR and Possible Post-op intubation/ventilation  Informed Consent: I have reviewed the patients History and Physical, chart, labs and discussed the procedure including the risks, benefits and alternatives for the proposed anesthesia with the patient or authorized representative who has indicated his/her understanding and acceptance.   Dental advisory given  Plan Discussed with: Anesthesiologist,  Surgeon and CRNA  Anesthesia Plan Comments: (Plan routine monitors, GETA)       Anesthesia Quick Evaluation

## 2012-08-31 NOTE — Transfer of Care (Signed)
Immediate Anesthesia Transfer of Care Note  Patient: [name]  Procedure(s) Performed: Procedure(s): ICD LEAD REMOVAL (Left)  Patient Location: PACU  Anesthesia Type:General  Level of Consciousness: sedated, patient cooperative and responds to stimulation  Airway & Oxygen Therapy: Patient Spontanous Breathing and Patient connected to nasal cannula oxygen  Post-op Assessment: Report given to PACU RN, Post -op Vital signs reviewed and stable and Patient moving all extremities  Post vital signs: Reviewed and stable  Complications: No apparent anesthesia complications

## 2012-08-31 NOTE — Anesthesia Postprocedure Evaluation (Signed)
  Anesthesia Post-op Note  Patient: [name]  Procedure(s) Performed: Procedure(s): ICD LEAD REMOVAL (Left)  Patient Location: PACU  Anesthesia Type:General  Level of Consciousness: awake, alert , oriented and patient cooperative  Airway and Oxygen Therapy: Patient Spontanous Breathing and Patient connected to nasal cannula oxygen  Post-op Pain: none  Post-op Assessment: Post-op Vital signs reviewed, Patient's Cardiovascular Status Stable, Respiratory Function Stable, Patent Airway, No signs of Nausea or vomiting and Pain level controlled  Post-op Vital Signs: Reviewed and stable  Complications: No apparent anesthesia complications

## 2012-08-31 NOTE — Progress Notes (Signed)
Day of Surgery  Subjective: Pt s/p pacer removal today; states he is doing much better with decreased back/rt hip pain  Objective: Vital signs in last 24 hours: Temp:  [97.5 F (36.4 C)-98.6 F (37 C)] 98.1 F (36.7 C) (08/06 1558) Pulse Rate:  [69-131] 69 (08/06 1558) Resp:  [11-21] 18 (08/06 1558) BP: (105-153)/(45-85) 153/85 mmHg (08/06 1558) SpO2:  [94 %-100 %] 96 % (08/06 1558) Arterial Line BP: (158-170)/(55-59) 158/56 mmHg (08/06 1115) Last BM Date: 08/31/12  Intake/Output from previous day: 08/05 0701 - 08/06 0700 In: -  Out: 95 [Drains:95] Intake/Output this shift: Total I/O In: 600 [I.V.:600] Out: 403 [Urine:325; Drains:73; Blood:5]  Rt RP drains intact, insertion sites ok; outputs diminishing (8-65 cc's range)- bloody fluid; cx's - staph  Lab Results:   Recent Labs  08/29/12 0540 08/30/12 0600  WBC 15.4* 13.2*  HGB 10.2* 10.3*  HCT 30.9* 31.3*  PLT 246 252   BMET  Recent Labs  08/29/12 0540 08/30/12 0600  NA 137 136  K 3.6 3.7  CL 93* 94*  CO2 34* 32  GLUCOSE 141* 140*  BUN 31* 28*  CREATININE 1.43* 1.28  CALCIUM 8.7 8.8   PT/INR  Recent Labs  08/31/12 0548 08/31/12 0720  LABPROT 19.4* 19.2*  INR 1.69* 1.67*   ABG No results found for this basename: PHART, PCO2, PO2, HCO3,  in the last 72 hours  Studies/Results: No results found. Results for orders placed during the hospital encounter of 08/25/12  URINE CULTURE     Status: None   Collection Time    08/26/12  7:32 AM      Result Value Range Status   Specimen Description URINE, CLEAN CATCH   Final   Special Requests NONE   Final   Culture  Setup Time 08/26/2012 21:40   Final   Colony Count >=100,000 COLONIES/ML   Final   Culture KLEBSIELLA OZAENAE   Final   Report Status 08/29/2012 FINAL   Final   Organism ID, Bacteria KLEBSIELLA OZAENAE   Final  CULTURE, BLOOD (ROUTINE X 2)     Status: None   Collection Time    08/26/12 11:20 AM      Result Value Range Status   Specimen  Description BLOOD LEFT ARM   Final   Special Requests BOTTLES DRAWN AEROBIC AND ANAEROBIC 10CC   Final   Culture  Setup Time 08/26/2012 16:54   Final   Culture     Final   Value: STAPHYLOCOCCUS AUREUS     Note: SUSCEPTIBILITIES PERFORMED ON PREVIOUS CULTURE WITHIN THE LAST 5 DAYS.     2 Note: Gram Stain Report Called to,Read Back By and Verified With: KELSEY [place] RN ON8 14 @420  EDMOJ   Report Status 08/29/2012 FINAL   Final  CULTURE, BLOOD (ROUTINE X 2)     Status: None   Collection Time    08/26/12 11:30 AM      Result Value Range Status   Specimen Description BLOOD LEFT HAND   Final   Special Requests     Final   Value: BOTTLES DRAWN AEROBIC AND ANAEROBIC AERO 10CC ANA 5CC   Culture  Setup Time 08/26/2012 16:54   Final   Culture     Final   Value: STAPHYLOCOCCUS AUREUS     Note: RIFAMPIN AND GENTAMICIN SHOULD NOT BE USED AS SINGLE DRUGS FOR TREATMENT OF STAPH INFECTIONS.     2 Note: Gram Stain Report Called to,Read Back By and Verified With: KELSEY [place] RN  ON8 14 @420  EDMOJ   Report Status 08/29/2012 FINAL   Final   Organism ID, Bacteria STAPHYLOCOCCUS AUREUS   Final  CULTURE, BLOOD (ROUTINE X 2)     Status: None   Collection Time    08/28/12 12:45 AM      Result Value Range Status   Specimen Description BLOOD RIGHT HAND   Final   Special Requests BOTTLES DRAWN AEROBIC AND ANAEROBIC 10CC   Final   Culture  Setup Time 08/28/2012 15:05   Final   Culture     Final   Value:        BLOOD CULTURE RECEIVED NO GROWTH TO DATE CULTURE WILL BE HELD FOR 5 DAYS BEFORE ISSUING A FINAL NEGATIVE REPORT   Report Status PENDING   Incomplete  CULTURE, BLOOD (ROUTINE X 2)     Status: None   Collection Time    08/28/12 12:55 AM      Result Value Range Status   Specimen Description BLOOD RIGHT HAND   Final   Special Requests BOTTLES DRAWN AEROBIC ONLY 10CC   Final   Culture  Setup Time 08/28/2012 15:05   Final   Culture     Final   Value:        BLOOD CULTURE RECEIVED NO GROWTH TO DATE  CULTURE WILL BE HELD FOR 5 DAYS BEFORE ISSUING A FINAL NEGATIVE REPORT   Report Status PENDING   Incomplete  CULTURE, ROUTINE-ABSCESS     Status: None   Collection Time    08/29/12  3:01 PM      Result Value Range Status   Specimen Description ABSCESS   Final   Special Requests RIGHT RETROPERITONEAL HEMATOMA   Final   Gram Stain     Final   Value: MODERATE WBC PRESENT, PREDOMINANTLY PMN     NO SQUAMOUS EPITHELIAL CELLS SEEN     MODERATE GRAM POSITIVE COCCI IN CLUSTERS     Performed at [facility]   Culture     Final   Value: ABUNDANT STAPHYLOCOCCUS AUREUS     Note: RIFAMPIN AND GENTAMICIN SHOULD NOT BE USED AS SINGLE DRUGS FOR TREATMENT OF STAPH INFECTIONS.     Performed at [facility]   Report Status PENDING   Incomplete  ANAEROBIC CULTURE     Status: None   Collection Time    08/29/12  3:01 PM      Result Value Range Status   Specimen Description ABSCESS   Final   Special Requests RIGHT RETROPERITONEAL HEMATOMA   Final   Gram Stain     Final   Value: MODERATE WBC PRESENT, PREDOMINANTLY PMN     NO SQUAMOUS EPITHELIAL CELLS SEEN     M0 GRAM POSITIVE COCCI     IN PAIRS INCISION     Performed at [facility]   Culture     Final   Value: NO ANAEROBES ISOLATED; CULTURE IN PROGRESS FOR 5 DAYS     Performed at [facility]   Report Status PENDING   Incomplete  SURGICAL PCR SCREEN     Status: None   Collection Time    08/31/12  7:00 AM      Result Value Range Status   MRSA, PCR NEGATIVE  NEGATIVE Final   Staphylococcus aureus NEGATIVE  NEGATIVE Final   Comment:            The Xpert SA Assay (FDA     approved for NASAL specimens     in  patients over 57 years of age),     is one component of     a comprehensive surveillance     program.  Test performance has     been validated by [employer] for patients greater     than or equal to 12 year old.     It is not intended     to diagnose infection nor to     guide or monitor treatment.     Anti-infectives: Anti-infectives   Start     Dose/Rate Route Frequency Ordered Stop   08/31/12 0930  gentamicin (GARAMYCIN) 80 mg in sodium chloride irrigation 0.9 % 500 mL irrigation  Status:  Discontinued       As needed 08/31/12 0931 08/31/12 1004   08/31/12 0630  gentamicin (GARAMYCIN) 80 mg in sodium chloride irrigation 0.9 % 500 mL irrigation  Status:  Discontinued     80 mg Irrigation On call 08/31/12 0627 08/31/12 1149   08/31/12 0630  ceFAZolin (ANCEF) IVPB 2 g/50 mL premix     2 g 100 mL/hr over 30 Minutes Intravenous On call 08/31/12 0627 08/31/12 0913   08/28/12 1400  ceFAZolin (ANCEF) IVPB 1 g/50 mL premix     1 g 100 mL/hr over 30 Minutes Intravenous 3 times per day 08/28/12 1136     08/28/12 0400  vancomycin (VANCOCIN) 1,500 mg in sodium chloride 0.9 % 500 mL IVPB  Status:  Discontinued     1,500 mg 250 mL/hr over 120 Minutes Intravenous Every 24 hours 08/27/12 0431 08/29/12 1425   08/27/12 0445  vancomycin (VANCOCIN) 2,000 mg in sodium chloride 0.9 % 500 mL IVPB     2,000 mg 250 mL/hr over 120 Minutes Intravenous  Once 08/27/12 0431 08/27/12 0722   08/26/12 1000  cefTRIAXone (ROCEPHIN) 1 g in dextrose 5 % 50 mL IVPB  Status:  Discontinued     1 g 100 mL/hr over 30 Minutes Intravenous Every 24 hours 08/26/12 0913 08/27/12 1610      Assessment/Plan: s/p rt RP hematoma drainages 8/4; cont current tx, drain irrigations; check f/u CT in next 3-4 days, esp if output cont to decline   LOS: 6 days    Leita Lindbloom,D [facility] 08/31/2012

## 2012-08-31 NOTE — Anesthesia Procedure Notes (Signed)
Procedure Name: Intubation Date/Time: 08/31/2012 9:08 AM Performed by: Jerilee Hoh Pre-anesthesia Checklist: Patient identified, Emergency Drugs available, Suction available and Patient being monitored Patient Re-evaluated:Patient Re-evaluated prior to inductionOxygen Delivery Method: Circle system utilized Preoxygenation: Pre-oxygenation with 100% oxygen Intubation Type: IV induction Ventilation: Mask ventilation without difficulty Laryngoscope Size: Mac and 4 Grade View: Grade I Tube type: Oral Tube size: 7.5 mm Number of attempts: 1 Airway Equipment and Method: Stylet Placement Confirmation: ETT inserted through vocal cords under direct vision,  positive ETCO2 and breath sounds checked- equal and bilateral Secured at: 22 cm Tube secured with: Tape Dental Injury: Teeth and Oropharynx as per pre-operative assessment

## 2012-08-31 NOTE — Op Note (Signed)
[name], [name]             ACCOUNT NO.:  [id number]  MEDICAL RECORD NO.:  [id number]  LOCATION:  [id number]                        FACILITY:  [facility]  PHYSICIAN:  Doylene Canning. Ladona Ridgel, MD    DATE OF BIRTH:  02-Apr-1930  DATE OF PROCEDURE:  08/31/2012 DATE OF DISCHARGE:                              OPERATIVE REPORT   PROCEDURE PERFORMED:  Extraction of a single-chamber pacemaker.  INDICATION:  Staph aureus bacteremia.  INTRODUCTION:  The patient is a very pleasant 79 year old man who presented to the hospital with fevers, chills, and altered mental status, was found to be bacteremic with Staph aureus in his blood stream.  He was found to have an abscess in his retroperitoneal space. The patient underwent transesophageal echo, which demonstrated no vegetation or endocarditis.  Because of his staph bacteremia, he is now referred for extraction of his permanent pacemaker system.  PROCEDURE:  After informed was obtained, the patient was taken to the operating room in the fasting state.  After usual preparation and draping, the Anesthesia Service was utilized to provide general anesthesia.  Invasive arterial monitoring was placed.  The 5-cm incision was carried out over the old pacemaker insertion site and electrocautery was utilized to free up the scar tissue and remove the generator.  The lead was freed up from its dense fibrous scar tissue with electrocautery.  The sewing sleeve was disconnected and freed up.  The stylet was advanced into the pacing lead.  Gentle traction was placed with no movement on the lead.  [employer] EZ locking stylet was advanced into the lead and locked in place.  Gentle traction placed in the lead and still no movement.  The [facility] 11-French LR electrosurgical dissection sheath was then advanced over the body of the lead and with just a couple of cuts into the subcutaneous tissue and along with gentle traction, the lead was removed in total.  There were  no hemodynamic sequelae.  Hemostasis was obtained with gentle pressure. The pocket was irrigated with antibiotic irrigation.  There was no evidence of any infected tissue in the pocket.  The incision was closed with 2-0 and 3-0 Vicryl suture.  Benzoin and Steri-Strips were painted on the skin.  A pressure dressing was applied and the patient was returned to his room in satisfactory condition.  COMPLICATIONS:  There were no immediate procedure complications.  RESULTS:  Demonstrates successful extraction of a single-chamber [employer] pacemaker in a patient with Staph aureus bacteremia.     Doylene Canning. Ladona Ridgel, MD    GWT/MEDQ  D:  08/31/2012  T:  08/31/2012  Job:  [id number]

## 2012-08-31 NOTE — Progress Notes (Signed)
PATIENT IDPeighton [name]  MRN: [id number]  DOB/AGE:  March 11, 1930 / 79 y.o.  Day of Surgery Procedure(s) (LRB): ICD LEAD REMOVAL (Left)    PROGRESS NOTE Subjective:   Patient is alert, oriented, no Nausea, no Vomiting, yes passing gas, yes Bowel Movement. Taking PO well. Denies SOB, Chest or Calf Pain. Using Incentive Spirometer, PAS in place. Ambulate WBAT, Patient reports pain as moderate,     Objective: Vital signs in last 24 hours: Temp:  [97.5 F (36.4 C)-98.6 F (37 C)] 98 F (36.7 C) (08/06 1531) Pulse Rate:  [76-131] 79 (08/06 1531) Resp:  [11-21] 16 (08/06 1531) BP: (105-150)/(45-83) 150/67 mmHg (08/06 1531) SpO2:  [94 %-100 %] 96 % (08/06 1531) Arterial Line BP: (158-170)/(55-59) 158/56 mmHg (08/06 1115)    Intake/Output from previous day: I/O last 3 completed shifts: In: -  Out: 1985 [Urine:1800; Drains:185]   Intake/Output this shift: Total I/O In: 600 [I.V.:600] Out: 403 [Urine:325; Drains:73; Blood:5]   LABORATORY DATA:  Recent Labs  08/29/12 0540  08/30/12 0600  08/31/12 0548 08/31/12 0706 08/31/12 0720 08/31/12 1016 08/31/12 1158  WBC 15.4*  --  13.2*  --   --   --   --   --   --   HGB 10.2*  --  10.3*  --   --   --   --   --   --   HCT 30.9*  --  31.3*  --   --   --   --   --   --   PLT 246  --  252  --   --   --   --   --   --   NA 137  --  136  --   --   --   --   --   --   K 3.6  --  3.7  --   --   --   --   --   --   CL 93*  --  94*  --   --   --   --   --   --   CO2 34*  --  32  --   --   --   --   --   --   BUN 31*  --  28*  --   --   --   --   --   --   CREATININE 1.43*  --  1.28  --   --   --   --   --   --   GLUCOSE 141*  --  140*  --   --   --   --   --   --   GLUCAP  --   < >  --   < >  --  107*  --  101* 96  INR 2.32*  --  2.54*  < > 1.69*  --  1.67*  --   --   CALCIUM 8.7  --  8.8  --   --   --   --   --   --   < > = values in this interval not displayed.  Examination: Neurologically intact Neurovascular intact Sensation  intact distally Intact pulses distally Dorsiflexion/Plantar flexion intact No cellulitis present} Pt has minimal pain with ROM of the hip.  No pain with log roll, internal or external rotation. Assessment:   Day of Surgery Procedure(s) (LRB): ICD LEAD REMOVAL (Left) ADDITIONAL DIAGNOSIS:  Diabetes, Cardiac Arrythmia Atrial Fib,  Renal Insufficiency Chronic and Renal Insufficiency Acute   Plan:  Weight Bearing as Tolerated (WBAT)  DVT Prophylaxis:  Coumadin  We will continue to monitor the pt's hip pain.  Depending the relief he gets from the drains, we will see if surgical intervention is needed in the future.        [name] R 08/31/2012, 3:37 PM

## 2012-08-31 NOTE — Progress Notes (Addendum)
[facility]  Cefazolin day # 3  Subjective: No new complaints   Antibiotics:  Anti-infectives   Start     Dose/Rate Route Frequency Ordered Stop   08/31/12 0930  gentamicin (GARAMYCIN) 80 mg in sodium chloride irrigation 0.9 % 500 mL irrigation  Status:  Discontinued       As needed 08/31/12 0931 08/31/12 1004   08/31/12 0630  gentamicin (GARAMYCIN) 80 mg in sodium chloride irrigation 0.9 % 500 mL irrigation  Status:  Discontinued     80 mg Irrigation On call 08/31/12 0627 08/31/12 1149   08/31/12 0630  ceFAZolin (ANCEF) IVPB 2 g/50 mL premix     2 g 100 mL/hr over 30 Minutes Intravenous On call 08/31/12 0627 08/31/12 0913   08/28/12 1400  ceFAZolin (ANCEF) IVPB 1 g/50 mL premix     1 g 100 mL/hr over 30 Minutes Intravenous 3 times per day 08/28/12 1136     08/28/12 0400  vancomycin (VANCOCIN) 1,500 mg in sodium chloride 0.9 % 500 mL IVPB  Status:  Discontinued     1,500 mg 250 mL/hr over 120 Minutes Intravenous Every 24 hours 08/27/12 0431 08/29/12 1425   08/27/12 0445  vancomycin (VANCOCIN) 2,000 mg in sodium chloride 0.9 % 500 mL IVPB     2,000 mg 250 mL/hr over 120 Minutes Intravenous  Once 08/27/12 0431 08/27/12 0722   08/26/12 1000  cefTRIAXone (ROCEPHIN) 1 g in dextrose 5 % 50 mL IVPB  Status:  Discontinued     1 g 100 mL/hr over 30 Minutes Intravenous Every 24 hours 08/26/12 0913 08/27/12 1610      Medications: Scheduled Meds: .  ceFAZolin (ANCEF) IV  1 g Intravenous Q8H  . finasteride  5 mg Oral Daily  . insulin aspart  0-9 Units Subcutaneous TID WC  . levothyroxine  75 mcg Oral QAC breakfast  . metolazone  2.5 mg Oral Q M,W,F  . metoprolol succinate  12.5 mg Oral Daily  . potassium chloride  40 mEq Oral Daily   Continuous Infusions:   PRN Meds:.acetaminophen, acetaminophen, ALPRAZolam, menthol-cetylpyridinium, morphine injection, ondansetron (ZOFRAN) IV, oxyCODONE-acetaminophen, phenol   Objective: Weight change:    Intake/Output Summary (Last 24 hours) at 08/31/12 2030 Last data filed at 08/31/12 1700  Gross per 24 hour  Intake    620 ml  Output    428 ml  Net    192 ml   Blood pressure 140/68, pulse 66, temperature 97.4 F (36.3 C), temperature source Oral, resp. rate 17, height 5' 11.5" (1.816 m), weight 244 lb 1.6 oz (110.723 kg), SpO2 97.00%. Temp:  [97.4 F (36.3 C)-98.6 F (37 C)] 97.4 F (36.3 C) (08/06 1755) Pulse Rate:  [66-131] 66 (08/06 1755) Resp:  [11-21] 17 (08/06 1755) BP: (105-153)/(45-85) 140/68 mmHg (08/06 1755) SpO2:  [94 %-100 %] 97 % (08/06 1755) Arterial Line BP: (158-170)/(55-59) 158/56 mmHg (08/06 1115)  Physical Exam: General: Alert and awake, oriented x3, not in any acute distress. HEENT: anicteric sclera, pupils reactive to light and accommodation, EOMI CVS regular rate, normal r,  no murmur rubs or gallops Chest: clear to auscultation bilaterally, no wheezing, rales or rhonchi Abdomen: soft nontender, nondistended, normal bowel sounds, Extremities: no  clubbing or edema noted bilaterally Skin: PM site clean  Neuro: difficult easing right leg due to pain  Lab Results:  Recent Labs  08/29/12 0540 08/30/12 0600  WBC 15.4* 13.2*  HGB 10.2* 10.3*  HCT 30.9* 31.3*  PLT 246 252  BMET  Recent Labs  08/29/12 0540 08/30/12 0600  NA 137 136  K 3.6 3.7  CL 93* 94*  CO2 34* 32  GLUCOSE 141* 140*  BUN 31* 28*  CREATININE 1.43* 1.28  CALCIUM 8.7 8.8    Micro Results: Recent Results (from the past 240 hour(s))  URINE CULTURE     Status: None   Collection Time    08/26/12  7:32 AM      Result Value Range Status   Specimen Description URINE, CLEAN CATCH   Final   Special Requests NONE   Final   Culture  Setup Time 08/26/2012 21:40   Final   Colony Count >=100,000 COLONIES/ML   Final   Culture KLEBSIELLA OZAENAE   Final   Report Status 08/29/2012 FINAL   Final   Organism ID, Bacteria KLEBSIELLA OZAENAE   Final  CULTURE, BLOOD (ROUTINE X 2)      Status: None   Collection Time    08/26/12 11:20 AM      Result Value Range Status   Specimen Description BLOOD LEFT ARM   Final   Special Requests BOTTLES DRAWN AEROBIC AND ANAEROBIC 10CC   Final   Culture  Setup Time 08/26/2012 16:54   Final   Culture     Final   Value: STAPHYLOCOCCUS AUREUS     Note: SUSCEPTIBILITIES PERFORMED ON PREVIOUS CULTURE WITHIN THE LAST 5 DAYS.     2 Note: Gram Stain Report Called to,Read Back By and Verified With: KELSEY [place] RN ON8 14 @420  EDMOJ   Report Status 08/29/2012 FINAL   Final  CULTURE, BLOOD (ROUTINE X 2)     Status: None   Collection Time    08/26/12 11:30 AM      Result Value Range Status   Specimen Description BLOOD LEFT HAND   Final   Special Requests     Final   Value: BOTTLES DRAWN AEROBIC AND ANAEROBIC AERO 10CC ANA 5CC   Culture  Setup Time 08/26/2012 16:54   Final   Culture     Final   Value: STAPHYLOCOCCUS AUREUS     Note: RIFAMPIN AND GENTAMICIN SHOULD NOT BE USED AS SINGLE DRUGS FOR TREATMENT OF STAPH INFECTIONS.     2 Note: Gram Stain Report Called to,Read Back By and Verified With: KELSEY [place] RN ON8 14 @420  EDMOJ   Report Status 08/29/2012 FINAL   Final   Organism ID, Bacteria STAPHYLOCOCCUS AUREUS   Final  CULTURE, BLOOD (ROUTINE X 2)     Status: None   Collection Time    08/28/12 12:45 AM      Result Value Range Status   Specimen Description BLOOD RIGHT HAND   Final   Special Requests BOTTLES DRAWN AEROBIC AND ANAEROBIC 10CC   Final   Culture  Setup Time 08/28/2012 15:05   Final   Culture     Final   Value:        BLOOD CULTURE RECEIVED NO GROWTH TO DATE CULTURE WILL BE HELD FOR 5 DAYS BEFORE ISSUING A FINAL NEGATIVE REPORT   Report Status PENDING   Incomplete  CULTURE, BLOOD (ROUTINE X 2)     Status: None   Collection Time    08/28/12 12:55 AM      Result Value Range Status   Specimen Description BLOOD RIGHT HAND   Final   Special Requests BOTTLES DRAWN AEROBIC ONLY 10CC   Final   Culture  Setup Time  08/28/2012 15:05   Final   Culture  Final   Value:        BLOOD CULTURE RECEIVED NO GROWTH TO DATE CULTURE WILL BE HELD FOR 5 DAYS BEFORE ISSUING A FINAL NEGATIVE REPORT   Report Status PENDING   Incomplete  CULTURE, ROUTINE-ABSCESS     Status: None   Collection Time    08/29/12  3:01 PM      Result Value Range Status   Specimen Description ABSCESS   Final   Special Requests RIGHT RETROPERITONEAL HEMATOMA   Final   Gram Stain     Final   Value: MODERATE WBC PRESENT, PREDOMINANTLY PMN     NO SQUAMOUS EPITHELIAL CELLS SEEN     MODERATE GRAM POSITIVE COCCI IN CLUSTERS     Performed at [facility]   Culture     Final   Value: ABUNDANT STAPHYLOCOCCUS AUREUS     Note: RIFAMPIN AND GENTAMICIN SHOULD NOT BE USED AS SINGLE DRUGS FOR TREATMENT OF STAPH INFECTIONS.     Performed at [facility]   Report Status PENDING   Incomplete  ANAEROBIC CULTURE     Status: None   Collection Time    08/29/12  3:01 PM      Result Value Range Status   Specimen Description ABSCESS   Final   Special Requests RIGHT RETROPERITONEAL HEMATOMA   Final   Gram Stain     Final   Value: MODERATE WBC PRESENT, PREDOMINANTLY PMN     NO SQUAMOUS EPITHELIAL CELLS SEEN     M0 GRAM POSITIVE COCCI     IN PAIRS INCISION     Performed at [facility]   Culture     Final   Value: NO ANAEROBES ISOLATED; CULTURE IN PROGRESS FOR 5 DAYS     Performed at [facility]   Report Status PENDING   Incomplete  SURGICAL PCR SCREEN     Status: None   Collection Time    08/31/12  7:00 AM      Result Value Range Status   MRSA, PCR NEGATIVE  NEGATIVE Final   Staphylococcus aureus NEGATIVE  NEGATIVE Final   Comment:            The Xpert SA Assay (FDA     approved for NASAL specimens     in patients over 20 years of age),     is one component of     a comprehensive surveillance     program.  Test performance has     been validated by [employer] for patients greater     than or equal to 33  year old.     It is not intended     to diagnose infection nor to     guide or monitor treatment.    Studies/Results: No results found.    Assessment/Plan: [name] is a 79 y.o. male with  Pacemaker, Right prosthetic hip, Laminectomy in January of 2014 admitted with right leg pain weakness fevers. He has been found to have MSSA bacteremia and a large retroperitoneal abscess that is adjacent to his prosthetic hip.   Orthopedics is following closely and IR attempted aspirate of the hip --all dry taps. He is to have sp IR guided aspiration of his abscess with SAureus  TEE failed to show vegetations on valves or overtly on PM  #1 MSSA bacteremia with PM infection sp Extraction by Dr. Franchot Gallo APPRECIATE his critical assistance here  --Repeat blood cultures drawn post PM removal --  We need to ensure control of source and potential metastatic foci. I'm worried about this large abscess in his retroperitoneum near his  prosthetic hip.   --Certainly was no fluid that was sent in the hip joint, but I do worry about this being a night a total source and risk for persistent infection. If the prosthetic hip is not explored I'm going to still assume that there is some level infection based on the abscess that is near his and radiographic findings. 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 As above if this area not explored I would likely place the patient on chronic suppressive antibiotics.  -- I WILL ADD RIFAMPIN 300MG  PO BID  #4 Bacteriuria: not clear to be UTI but covered with ancef regardless     LOS: 6 days   Acey Lav 08/31/2012, 8:30 PM

## 2012-08-31 NOTE — Progress Notes (Signed)
JP drains to rt retroperitoneal flushed with 10 ml NS. Patient tolerated well. Drain patent.

## 2012-08-31 NOTE — Clinical Social Work Placement (Signed)
Clinical Social Work Department BRIEF PSYCHOSOCIAL ASSESSMENT 08/31/2012  Patient:  ANTWAINE, BOOMHOWER     Account Number:  [id number]     Admit date:  08/25/2012  Clinical Social Worker:  Sherre Lain  Date/Time:  08/31/2012 02:23 PM  Referred by:  Physician  Date Referred:  08/31/2012 Referred for  SNF Placement   Other Referral:   none.   Interview type:  Family Other interview type:   none.    PSYCHOSOCIAL DATA Living Status:  WIFE Admitted from facility:   Level of care:   Primary support name:  [name] Callicitt Primary support relationship to patient:  SPOUSE Degree of support available:   Strong support system.    CURRENT CONCERNS Current Concerns  None Noted   Other Concerns:   none.    SOCIAL WORK ASSESSMENT / PLAN CSW met with pt's wife at bedside due to pt not being present in the room. Pt was in a procedure at the time of the assessment. Pt's wife stated that prio to admission to [facility], pt was living at home with his wife. Pt's wife noted that she was agreeable and understanding of SNF placement and would prefer [facility] due to pt having been there before. CSW stated that they will fax pt out to all of [facility], but focus on [facility]. Pt's wife was agreeable and understanding. CSW to continue to follow and assist with discharge needs.   Assessment/plan status:  Psychosocial Support/Ongoing Assessment of Needs Other assessment/ plan:   none.   Information/referral to community resources:   none.    PATIENTS/FAMILYS RESPONSE TO PLAN OF CARE: Pt's wife was agreeable and understanding to CSW plan of care.      Darlyn Chamber, MSW, LCSWA Clinical Social Work [phone]

## 2012-08-31 NOTE — Progress Notes (Signed)
Patient transported to surgery by Larry/Terry.  [name]

## 2012-08-31 NOTE — CV Procedure (Signed)
PPM system extraction without immediate complication. R#[id number].

## 2012-08-31 NOTE — Progress Notes (Signed)
[facility] PROGRESS NOTE  Dohn Hail ZOX:[id number] DOB: 09/09/1930 DOA: 08/25/2012 PCP: Malka So., MD  Assessment/Plan: #1 MSSA bacteremia  CT of the hip abdomen and pelvis concerning for complex fluid collection of the left hip and possible infected retroperitoneal hematoma. Continue empiric IV cefazolin as per ID recommendations. Repeat blood cultures pending -TEE--no vegetations -08/31/12 PPM removed -concerned about right prosthetic hip -consider adding rifampin esp if repeat blood cult become positive -agree with chronic abx suppression if hip no explanted - 8/3  blood cultures negative #2 Klebsiella ozaenae bacteruria -Urinalysis did not show  pyuria #3 Large retroperitoneal complex fluid collection and complex fluid collection of the right hip abscess/hematoma  Patient noted to have an INR as high as 7.19 on day of admission. INR has been reversed. CT of the hip and CT of the abdomen and pelvis worrisome for complex fluid collection around the right hip and also retroperitoneal hematoma. Gen. surgery and orthopedics have been consulted. Interventional radiology s/p aspiration and drainage 08/29/12. Prelim aspiration with GPC in clusters. Continue empiric IV antibiotics. Continue supportive care. Warm compresses. ID/IR/CCS ff. Follow.  #4 right lower extremity weakness and back pain  Likely secondary to infected retroperitoneal hematoma and possible complex fluid collection noted around the right hip. Orthopedics has been consulted as general surgery.  #5 acute renal failure/dehydration/hypokalemia  -improving -Likely combination of hypovolemia and ATN Acute renal failure secondary to prerenal azotemia.  Patient also noted to be in [place] which is the likely etiology of this is hypokalemia. Replete potassium. Continue daily potassium.  #6 supratherapeutic INR  -Deferred need on chronic anticoagulation to cardiology Status post vitamin K doses x2. Status post FFP. INR  trending back up. Patient likely has a retroperitoneal hematoma and a such will hold off on anticoagulations for now. #7 leukocytosis  -Improving on abx #8 right-sided abdominal tenderness  Clinical improvement. Likely secondary to retroperitoneal hematoma. Abdominal ultrasound and abdominal films negative for any acute abnormalities. Abdominal ultrasound positive for hepatic steatosis. Follow. Pain management. Supportive care.  #9 atrial fibrillation  Continue beta blocker for rate control. INR has been reversed, but trending back up. Patient is status post permanent pacemaker. Follow.  #10 normocytic anemia  Stable. Follow H&H.  #11 type 2 diabetes  Continue sliding scale insulin. Family Communication:   Wife and daughter at beside Disposition Plan:   SNF when medically stable  Antibiotics:  IV vancomycin 08/26/2012--->08/29/12  IV Rocephin 08/26/2012---. 08/27/12  IV Ancef 08/28/2012         Procedures/Studies: Ct Abdomen Pelvis Wo Contrast  08/28/2012   *RADIOLOGY REPORT*  Clinical Data: Evaluate abscess.  CT ABDOMEN AND PELVIS WITHOUT CONTRAST  Technique:  Multidetector CT imaging of the abdomen and pelvis was performed following the standard protocol without intravenous contrast.  Comparison: CT of the right hip from the same date.  Findings:  BODY WALL: Unremarkable.  LOWER CHEST:  Mediastinum: Single chamber right ventricular pacer.  Coronary artery atherosclerosis.  Lungs/pleura: Small bilateral pleural effusions and associated atelectasis.  ABDOMEN/PELVIS:  Liver: No focal abnormality.  Biliary: Layering stones.  No evidence of acute cholecystitis.  Pancreas: Fatty atrophy.  Spleen: Unremarkable.  Adrenals: Unremarkable.  Kidneys and ureters: There are two exophytic right renal lesions in the interpolar region.  The largest measures 2.6 cm and is compatible with cyst - confirmed by recent sonography.  The second measures 15 mm and is soft tissue density. This was not definitely seen  on prior ultrasound. This lesion may have grown mildly since imaging  01/20/2012.  No hydronephrosis or stone.  Bladder: Mild distension by urine.  Bowel:  No definite communication between the bowel and retroperitoneal collection.  Unfortunately, enteric contrast has not yet reached the colon at time of imaging.   Normal appendix.  Retroperitoneum:Extensive right retroperitoneal hematoma gas and fluid collection, involving the lower posterior pararenal space, the right iliacus muscle, proximal right rectus femoris, iliopsoas bursa.  The largest posterior pararenal component is at the pelvic brim, measuring 9 x 4 by 6 cm.  The psoas muscle is spared.  No evidence of epidural extension of infection.  The neighboring osseous structures show no evidence of osteomyelitis.  Evaluation of the total right hip prosthesis was better accomplished on dedicated CT imaging. There is a relatively large collection posterior to the right femoral neck and greater trochanter, containing fluid and gas, measuring 7 x 3 x 3 cm. High density material present within the expanded right iliacus, measuring approximately 5 cm in diameter.  Peritoneum: No free fluid or gas.  Reproductive: Prostate enlargement calcifications.  Vascular: Extensive atherosclerotic calcification.  OSSEOUS: Total right hip prosthesis with surrounding fluid collections, as above.  Extensive degenerative disc disease with multilevel fusion and laminectomy.  No evidence of disc space infection. No suspicious lytic or blastic lesions.  IMPRESSION:  1.  Extensive complex fluid collection, likely abscess, involving the lower right retroperitoneum and right periarticular hip - size and extent described above. Although there is extensive gas within the collection, no bowel communication identified.  Probable hematoma within the iliacus component.  2. Indeterminate, possibly solid 15 mm lesion in the interpolar right kidney.  Followup imaging advised.  3. Cholelithiasis.    Original Report Authenticated By: Tiburcio Pea   Dg Hip Complete Right  08/28/2012   *RADIOLOGY REPORT*  Clinical Data: 79 year old male with right hip pain.  RIGHT HIP - COMPLETE 2+ VIEW  Comparison: 11/26/2010 radiographs  Findings: Right total hip replacement again identified. There is no evidence of fracture, subluxation or dislocation. No complicating features are identified.  IMPRESSION: Right total hip replacement without acute abnormality.   Original Report Authenticated By: Harmon Pier, M.D.   Ct Lumbar Spine Wo Contrast  08/27/2012   *RADIOLOGY REPORT*  Clinical Data: Low back/right leg pain, fever, positive blood cultures  CT LUMBAR SPINE WITHOUT CONTRAST  Technique:  Multidetector CT imaging of the lumbar spine was performed without intravenous contrast administration. Multiplanar CT image reconstructions were also generated.  Comparison: CT myelogram dated 02/10/2012.  Findings: No evidence of fracture or dislocation.  Vertebral body heights are maintained.  Postsurgical changes related to prior T12-L1 posterior laminectomy/discectomy and prior L4-S1 posterior laminectomy.  Prior L3-4 fusion.  Moderate to severe multilevel degenerative changes, unchanged from prior CT myelogram.  This includes mild to moderate central canal stenosis of the upper lumbar spine and moderate to severe central canal stenosis of the lower lumbar spine with nerve root encroachment at multiple levels.  No evidence of epidural fluid collection/abscess.  Complex fluid collection in the right abdomen/pelvis, incompletely visualized.  IMPRESSION: Postsurgical changes involving the lower thoracolumbar spine.  Stable moderate to severe multilevel degenerative changes, unchanged from prior CT myelogram.  No evidence of epidural fluid collection/abscess.  Complex fluid collection in the right abdomen/pelvis, incompletely visualized.  Please refer to CT right hip report for further assessment.   Original Report Authenticated By:  Charline Bills, M.D.   US Abdomen Complete  08/26/2012   *RADIOLOGY REPORT*  Clinical Data:  79 year old with abnormal liver function tests.  COMPLETE ABDOMINAL  ULTRASOUND  Comparison:  Unenhanced CT abdomen and pelvis 101-Feb-202013 [place].  Abdominal ultrasound 05/21/2011 [facility].  Findings:  Gallbladder:  Small shadowing gallstones.  Patient unable to turn into the lateral decubitus position to confirm movement of the stones, but these were present on the prior ultrasound examination. No gallbladder wall thickening or pericholecystic fluid.  Negative sonographic Murphy's sign according to the ultrasound technologist.  Common bile duct:  Normal in caliber with maximum diameter approximating 3 mm.  Liver:  Diffusely increased and coarsened echotexture without focal parenchymal abnormality.  Patent portal vein with hepatopetal flow.  IVC:  Patent in its intrahepatic portion.  Obscured outside the liver by bowel gas.  Pancreas:  Obscured by midline bowel gas and therefore not evaluated.  Spleen:  Normal size and echotexture without focal parenchymal abnormality.  Right Kidney:  No hydronephrosis.  Well-preserved cortex for age. Cysts arising from the mid and lower pole as noted previously, the cyst arising from the mid kidney approximating 2.8 cm and the cyst arising from the lower pole approximating 3.1 cm.  No solid renal masses.  Normal parenchymal echotexture.  Approximately 12.0 cm in length.  Left Kidney:  No hydronephrosis.  Well-preserved cortex for age. Normal parenchymal echotexture.  No focal parenchymal abnormality. Approximately 10.7 cm in length.  Abdominal aorta:  Obscured by midline bowel gas and therefore not evaluated.  IMPRESSION:  1.  Cholelithiasis.  No sonographic evidence of acute cholecystitis.  No biliary ductal dilation. 2.  Diffuse hepatic steatosis without focal hepatic parenchymal abnormality. 3.  Midline structures (pancreas, extrahepatic IVC, and abdominal aorta)  obscured by overlying bowel gas and therefore not evaluated.   Original Report Authenticated By: Hulan Saas, M.D.   Ct Hip Right Wo Contrast  08/27/2012   *RADIOLOGY REPORT*  Clinical Data: Low back/right leg pain, fever, positive blood cultures  CT OF THE RIGHT HIP WITHOUT CONTRAST  Technique:  Multidetector CT imaging was performed according to the standard protocol. Multiplanar CT image reconstructions were also generated.  Comparison: CT abdomen pelvis dated 101-Feb-202013.  Findings: Right total hip arthroplasty.  No evidence of fracture or dislocation.  Complex fluid collection/debris involving the right hip (series 5/image 36), right iliopsoas/iliacus muscles (series 5/images 9 and 23), and the visualized right lower retroperitoneum (series 5/image 1).  Representative axial measurements include: --3.8 x 8.3 cm in the right retroperitoneum (series 5/image 5) --9.2 x 5.6 cm in the right iliacus muscle (series 5/image 13) --12.7 x 7.3 cm along the right hip (series 5/image 37)  Based on this study, it is unclear whether this reflects a right hip infection which secondarily involves the lower abdomen/pelvis or whether this originated in the lower abdomen/pelvis and now extends to the right hip.  IMPRESSION: Right total hip arthroplasty.  Complex fluid collection/debris involving the right hip, right iliopsoas/iliacus muscles, and visualized right lower retroperitoneum, as described above.  Based on this study, it is unclear whether this reflects a right hip infection which secondarily involves the lower abdomen/pelvis (favored) or whether this originated in the lower abdomen/pelvis and now extends to the right hip.  Consider CT abdomen/pelvis (preferably with half dose contrast) for further evaluation.  These results were called by telephone on 08/27/2012 at 2000 hrs to Dr. Tressie Stalker, who verbally acknowledged these results.   Original Report Authenticated By: Charline Bills, M.D.   Dg Chest Port 1  View  08/26/2012   *RADIOLOGY REPORT*  Clinical Data: Elevated white blood count  PORTABLE CHEST - 1 VIEW  Comparison:  November 11, 2010.  Findings: Stable cardiomediastinal silhouette.  Left-sided pacemaker is unchanged in position.  No acute pulmonary disease is noted.  No pneumothorax or pleural effusion is noted.  Degenerative joint disease is seen involving both shoulders.  IMPRESSION: No acute cardiopulmonary abnormality seen.   Original Report Authenticated By: Lupita Raider.,  M.D.   Dg Abd Portable 2v  08/26/2012   *RADIOLOGY REPORT*  Clinical Data: Abdominal pain.  PORTABLE ABDOMEN - 2 VIEW  Comparison: [facility] images for CT scan of the lumbar spine dated 02/10/2012  Findings: There is no free air in the abdomen.  There is air scattered throughout nondistended loops of large and small bowel. No excessive stool in the colon.  Diffuse degenerative changes of the lumbar spine.  Right hip prosthesis.  IMPRESSION: No acute abnormalities.   Original Report Authenticated By: Francene Boyers, M.D.   Ct Image Guided Fluid Drain By Catheter  08/29/2012   *RADIOLOGY REPORT*  Clinical Data: Right-sided retroperitoneal fluid collections containing air.  CT GUIDED DRAINAGE OF RETROPERITONEAL ABSCESS x 2  Sedation:  2.0 mg IV Versed;  50 mcg IV Fentanyl  Total Moderate Sedation Time: 36 minutes.  Procedure:  The procedure, risks, benefits, and alternatives were explained to the patient.  Questions regarding the procedure were encouraged and answered. The patient understands and consents to the procedure.  The right lateral abdominal wall was prepped with Betadine in a sterile fashion, and a sterile drape was applied covering the operative field.  A sterile gown and sterile gloves were used for the procedure. Local anesthesia was provided with 1% Lidocaine.  CT was performed in a supine position with the right side rolled up slightly.  Under CT guidance, two separate 18 gauge trocar needles were advanced into both  anterior and posterior retroperitoneal fluid collections on the right.  Fluid aspiration was performed and a fluid sample sent for culture analysis.  Guide wires were advanced into the two dominant pockets.  Tracts were dilated and 12-French percutaneous drainage catheters placed. Catheter position was confirmed by CT.  The catheters were flushed with saline and connected to suction bulbs.  Both catheters were secured at the skin with Prolene retention sutures and adhesive Stat-Lock devices.  Complications: None  Findings: It did appear that the dominant retroperitoneal fluid collections do not appear to show good communication with two dominant pockets identified.  Aspiration at the level of the anterior dominant pocket revealed liquefied blood which was not grossly turbid.  Aspiration of the dominant posterior retroperitoneal collection in the iliac fossa yielded dark partially liquefied blood.  12-French drains were placed in both collections.  The more anterior collection shows much more significant initial return of liquefied blood.  The posterior collection is slowly draining after flushing the drainage catheter.  IMPRESSION: Both anterior and posterior dominant right retroperitoneal collections were aspirated and treated with placement of indwelling 12-French drains.  The more anterior collection demonstrated free return of liquefied blood.  The more posterior collection yielded much darker old blood product with slow return.  Both drains were connected to suction bulbs and will be flushed every 8 hours.   Original Report Authenticated By: Irish Lack, M.D.   Ct Image Guided Fluid Drain By Catheter  08/29/2012   *RADIOLOGY REPORT*  Clinical Data: Right-sided retroperitoneal fluid collections containing air.  CT GUIDED DRAINAGE OF RETROPERITONEAL ABSCESS x 2  Sedation:  2.0 mg IV Versed;  50 mcg IV Fentanyl  Total Moderate Sedation Time: 36 minutes.  Procedure:  The  procedure, risks, benefits, and  alternatives were explained to the patient.  Questions regarding the procedure were encouraged and answered. The patient understands and consents to the procedure.  The right lateral abdominal wall was prepped with Betadine in a sterile fashion, and a sterile drape was applied covering the operative field.  A sterile gown and sterile gloves were used for the procedure. Local anesthesia was provided with 1% Lidocaine.  CT was performed in a supine position with the right side rolled up slightly.  Under CT guidance, two separate 18 gauge trocar needles were advanced into both anterior and posterior retroperitoneal fluid collections on the right.  Fluid aspiration was performed and a fluid sample sent for culture analysis.  Guide wires were advanced into the two dominant pockets.  Tracts were dilated and 12-French percutaneous drainage catheters placed. Catheter position was confirmed by CT.  The catheters were flushed with saline and connected to suction bulbs.  Both catheters were secured at the skin with Prolene retention sutures and adhesive Stat-Lock devices.  Complications: None  Findings: It did appear that the dominant retroperitoneal fluid collections do not appear to show good communication with two dominant pockets identified.  Aspiration at the level of the anterior dominant pocket revealed liquefied blood which was not grossly turbid.  Aspiration of the dominant posterior retroperitoneal collection in the iliac fossa yielded dark partially liquefied blood.  12-French drains were placed in both collections.  The more anterior collection shows much more significant initial return of liquefied blood.  The posterior collection is slowly draining after flushing the drainage catheter.  IMPRESSION: Both anterior and posterior dominant right retroperitoneal collections were aspirated and treated with placement of indwelling 12-French drains.  The more anterior collection demonstrated free return of liquefied  blood.  The more posterior collection yielded much darker old blood product with slow return.  Both drains were connected to suction bulbs and will be flushed every 8 hours.   Original Report Authenticated By: Irish Lack, M.D.         Subjective: Patient denies fevers, chills, chest discomfort, shortness of breath, nausea, vomiting, diarrhea, abdominal pain. Complains of right flank pain. No dysuria or hematuria. No headaches.  Objective: Filed Vitals:   08/31/12 1500 08/31/12 1531 08/31/12 1558 08/31/12 1755  BP: 133/83 150/67 153/85 140/68  Pulse: 78 79 69 66  Temp: 98.1 F (36.7 C) 98 F (36.7 C) 98.1 F (36.7 C) 97.4 F (36.3 C)  TempSrc: Oral Oral Oral Oral  Resp: 17 16 18 17   Height:      Weight:      SpO2: 100% 96% 96% 97%    Intake/Output Summary (Last 24 hours) at 08/31/12 1811 Last data filed at 08/31/12 1130  Gross per 24 hour  Intake    600 ml  Output    473 ml  Net    127 ml   Weight change:  Exam:   General:  Pt is alert, follows commands appropriately, not in acute distress  HEENT: No icterus, No thrush, /AT  Cardiovascular: IRRR, S1/S2, no rubs, no gallops  Respiratory: CTA bilaterally, no wheezing, no crackles, no rhonchi  Abdomen: Soft/+BS, non tender, non distended, no guarding; lower quadrant tenderness at the drain site.  Extremities: trace edema, No lymphangitis, No petechiae, No rashes, no synovitis  Data Reviewed: Basic Metabolic Panel:  Recent Labs Lab 08/26/12 1120  08/27/12 0530 08/27/12 1435 08/28/12 0035 08/29/12 0540 08/30/12 0600  NA 134*  < > 137 138 134* 137 136  K 2.7*  < > 2.6* 3.7 3.0* 3.6 3.7  CL 91*  < > 91* 95* 93* 93* 94*  CO2 33*  < > 34* 35* 33* 34* 32  GLUCOSE 139*  < > 153* 152* 160* 141* 140*  BUN 71*  < > 59* 54* 45* 31* 28*  CREATININE 2.51*  < > 1.95* 1.78* 1.51* 1.43* 1.28  CALCIUM 8.6  < > 8.9 8.7 8.5 8.7 8.8  MG 2.7*  --   --   --   --   --   --   < > = values in this interval not  displayed. Liver Function Tests:  Recent Labs Lab 08/26/12 1120 08/27/12 0530 08/28/12 0035  AST 192* 117* 82*  ALT 78* 60* 49  ALKPHOS 89 97 94  BILITOT 0.7 1.2 1.3*  PROT 6.3 6.4 6.3  ALBUMIN 1.8* 2.3* 1.9*   No results found for this basename: LIPASE, AMYLASE,  in the last 168 hours No results found for this basename: AMMONIA,  in the last 168 hours CBC:  Recent Labs Lab 08/26/12 0748 08/26/12 0751 08/27/12 0530 08/28/12 0035 08/29/12 0540 08/30/12 0600  WBC  --  23.7* 17.0* 14.4* 15.4* 13.2*  NEUTROABS 20.9*  --  15.3*  --  13.4* 11.2*  HGB  --  10.3* 9.9* 9.6* 10.2* 10.3*  HCT  --  29.7* 28.7* 29.5* 30.9* 31.3*  MCV  --  87.1 88.3 89.7 90.4 89.9  PLT  --  226 218 224 246 252   Cardiac Enzymes: No results found for this basename: CKTOTAL, CKMB, CKMBINDEX, TROPONINI,  in the last 168 hours BNP: No components found with this basename: POCBNP,  CBG:  Recent Labs Lab 08/30/12 2303 08/31/12 0706 08/31/12 1016 08/31/12 1158 08/31/12 1612  GLUCAP 134* 107* 101* 96 116*    Recent Results (from the past 240 hour(s))  URINE CULTURE     Status: None   Collection Time    08/26/12  7:32 AM      Result Value Range Status   Specimen Description URINE, CLEAN CATCH   Final   Special Requests NONE   Final   Culture  Setup Time 08/26/2012 21:40   Final   Colony Count >=100,000 COLONIES/ML   Final   Culture KLEBSIELLA OZAENAE   Final   Report Status 08/29/2012 FINAL   Final   Organism ID, Bacteria KLEBSIELLA OZAENAE   Final  CULTURE, BLOOD (ROUTINE X 2)     Status: None   Collection Time    08/26/12 11:20 AM      Result Value Range Status   Specimen Description BLOOD LEFT ARM   Final   Special Requests BOTTLES DRAWN AEROBIC AND ANAEROBIC 10CC   Final   Culture  Setup Time 08/26/2012 16:54   Final   Culture     Final   Value: STAPHYLOCOCCUS AUREUS     Note: SUSCEPTIBILITIES PERFORMED ON PREVIOUS CULTURE WITHIN THE LAST 5 DAYS.     2 Note: Gram Stain Report  Called to,Read Back By and Verified With: KELSEY [place] RN ON8 14 @420  EDMOJ   Report Status 08/29/2012 FINAL   Final  CULTURE, BLOOD (ROUTINE X 2)     Status: None   Collection Time    08/26/12 11:30 AM      Result Value Range Status   Specimen Description BLOOD LEFT HAND   Final   Special Requests     Final   Value: BOTTLES DRAWN AEROBIC AND ANAEROBIC AERO 10CC  ANA 5CC   Culture  Setup Time 08/26/2012 16:54   Final   Culture     Final   Value: STAPHYLOCOCCUS AUREUS     Note: RIFAMPIN AND GENTAMICIN SHOULD NOT BE USED AS SINGLE DRUGS FOR TREATMENT OF STAPH INFECTIONS.     2 Note: Gram Stain Report Called to,Read Back By and Verified With: KELSEY [place] RN ON8 14 @420  EDMOJ   Report Status 08/29/2012 FINAL   Final   Organism ID, Bacteria STAPHYLOCOCCUS AUREUS   Final  CULTURE, BLOOD (ROUTINE X 2)     Status: None   Collection Time    08/28/12 12:45 AM      Result Value Range Status   Specimen Description BLOOD RIGHT HAND   Final   Special Requests BOTTLES DRAWN AEROBIC AND ANAEROBIC 10CC   Final   Culture  Setup Time 08/28/2012 15:05   Final   Culture     Final   Value:        BLOOD CULTURE RECEIVED NO GROWTH TO DATE CULTURE WILL BE HELD FOR 5 DAYS BEFORE ISSUING A FINAL NEGATIVE REPORT   Report Status PENDING   Incomplete  CULTURE, BLOOD (ROUTINE X 2)     Status: None   Collection Time    08/28/12 12:55 AM      Result Value Range Status   Specimen Description BLOOD RIGHT HAND   Final   Special Requests BOTTLES DRAWN AEROBIC ONLY 10CC   Final   Culture  Setup Time 08/28/2012 15:05   Final   Culture     Final   Value:        BLOOD CULTURE RECEIVED NO GROWTH TO DATE CULTURE WILL BE HELD FOR 5 DAYS BEFORE ISSUING A FINAL NEGATIVE REPORT   Report Status PENDING   Incomplete  CULTURE, ROUTINE-ABSCESS     Status: None   Collection Time    08/29/12  3:01 PM      Result Value Range Status   Specimen Description ABSCESS   Final   Special Requests RIGHT RETROPERITONEAL HEMATOMA    Final   Gram Stain     Final   Value: MODERATE WBC PRESENT, PREDOMINANTLY PMN     NO SQUAMOUS EPITHELIAL CELLS SEEN     MODERATE GRAM POSITIVE COCCI IN CLUSTERS     Performed at [facility]   Culture     Final   Value: ABUNDANT STAPHYLOCOCCUS AUREUS     Note: RIFAMPIN AND GENTAMICIN SHOULD NOT BE USED AS SINGLE DRUGS FOR TREATMENT OF STAPH INFECTIONS.     Performed at [facility]   Report Status PENDING   Incomplete  ANAEROBIC CULTURE     Status: None   Collection Time    08/29/12  3:01 PM      Result Value Range Status   Specimen Description ABSCESS   Final   Special Requests RIGHT RETROPERITONEAL HEMATOMA   Final   Gram Stain     Final   Value: MODERATE WBC PRESENT, PREDOMINANTLY PMN     NO SQUAMOUS EPITHELIAL CELLS SEEN     M0 GRAM POSITIVE COCCI     IN PAIRS INCISION     Performed at [facility]   Culture     Final   Value: NO ANAEROBES ISOLATED; CULTURE IN PROGRESS FOR 5 DAYS     Performed at [facility]   Report Status PENDING   Incomplete  SURGICAL PCR SCREEN     Status: None   Collection  Time    08/31/12  7:00 AM      Result Value Range Status   MRSA, PCR NEGATIVE  NEGATIVE Final   Staphylococcus aureus NEGATIVE  NEGATIVE Final   Comment:            The Xpert SA Assay (FDA     approved for NASAL specimens     in patients over 56 years of age),     is one component of     a comprehensive surveillance     program.  Test performance has     been validated by [employer] for patients greater     than or equal to 57 year old.     It is not intended     to diagnose infection nor to     guide or monitor treatment.     Scheduled Meds: .  ceFAZolin (ANCEF) IV  1 g Intravenous Q8H  . finasteride  5 mg Oral Daily  . insulin aspart  0-9 Units Subcutaneous TID WC  . levothyroxine  75 mcg Oral QAC breakfast  . metolazone  2.5 mg Oral Q M,W,F  . metoprolol succinate  12.5 mg Oral Daily  . potassium chloride  40 mEq Oral Daily    Continuous Infusions:    Whitley Patchen, DO  [facility] Pager [phone]  If 7PM-7AM, please contact night-coverage [email or web address] Password [email or web address] 08/31/2012, 6:11 PM   LOS: 6 days

## 2012-09-01 ENCOUNTER — Encounter (HOSPITAL_COMMUNITY): Payer: Self-pay | Admitting: Internal Medicine

## 2012-09-01 ENCOUNTER — Inpatient Hospital Stay (HOSPITAL_COMMUNITY): Payer: Medicare Other

## 2012-09-01 LAB — PROTIME-INR
INR: 1.24 (ref 0.00–1.49)
Prothrombin Time: 15.3 seconds — ABNORMAL HIGH (ref 11.6–15.2)

## 2012-09-01 LAB — GLUCOSE, CAPILLARY
Glucose-Capillary: 102 mg/dL — ABNORMAL HIGH (ref 70–99)
Glucose-Capillary: 112 mg/dL — ABNORMAL HIGH (ref 70–99)
Glucose-Capillary: 101 mg/dL — ABNORMAL HIGH (ref 70–99)

## 2012-09-01 LAB — GRAM STAIN

## 2012-09-01 LAB — CULTURE, ROUTINE-ABSCESS

## 2012-09-01 LAB — CBC
MCV: 89.9 fL (ref 78.0–100.0)
Platelets: 292 10*3/uL (ref 150–400)
RDW: 15.9 % — ABNORMAL HIGH (ref 11.5–15.5)
WBC: 14.1 10*3/uL — ABNORMAL HIGH (ref 4.0–10.5)

## 2012-09-01 LAB — BASIC METABOLIC PANEL
Chloride: 95 mEq/L — ABNORMAL LOW (ref 96–112)
Creatinine, Ser: 1.16 mg/dL (ref 0.50–1.35)
GFR calc Af Amer: 67 mL/min — ABNORMAL LOW (ref >90)
Potassium: 3.8 mEq/L (ref 3.5–5.1)
Sodium: 135 mEq/L (ref 135–145)

## 2012-09-01 NOTE — Progress Notes (Signed)
1 Day Post-Op  Subjective: No real complaints this am except tired  Objective: Vital signs in last 24 hours: Temp:  [97.4 F (36.3 C)-98.3 F (36.8 C)] 98.1 F (36.7 C) (08/07 0555) Pulse Rate:  [66-131] 78 (08/07 0555) Resp:  [11-21] 18 (08/07 0555) BP: (105-153)/(45-85) 130/70 mmHg (08/07 0555) SpO2:  [94 %-100 %] 99 % (08/07 0555) Arterial Line BP: (158-170)/(55-59) 158/56 mmHg (08/06 1115) Last BM Date: 08/31/12  Intake/Output from previous day: 08/06 0701 - 08/07 0700 In: 620 [I.V.:600] Out: 2228 [Urine:2125; Drains:98; Blood:5] Intake/Output this shift:    GI: mild tenderness right side bs present  Lab Results:   Recent Labs  08/30/12 0600 09/01/12 0620  WBC 13.2* 14.1*  HGB 10.3* 10.6*  HCT 31.3* 31.9*  PLT 252 292   BMET  Recent Labs  08/30/12 0600 09/01/12 0620  NA 136 135  K 3.7 3.8  CL 94* 95*  CO2 32 31  GLUCOSE 140* 100*  BUN 28* 21  CREATININE 1.28 1.16  CALCIUM 8.8 9.0   PT/INR  Recent Labs  08/31/12 0720 09/01/12 0620  LABPROT 19.2* 15.3*  INR 1.67* 1.24   ABG No results found for this basename: PHART, PCO2, PO2, HCO3,  in the last 72 hours  Studies/Results: Dg Chest 2 View  09/01/2012   *RADIOLOGY REPORT*  Clinical Data: Interval removal of cardiac pacer apparatus.  CHEST - 2 VIEW  Comparison: Chest radiograph the 02/15/2012  Findings: Stable enlarged cardiac and mediastinal contours with calcification of the transverse thoracic aorta.  No consolidative pulmonary opacities.  No pleural effusion or pneumothorax. Mid thoracic spine degenerative change.  IMPRESSION: No acute cardiopulmonary process.   Original Report Authenticated By: Annia Belt, M.D    Anti-infectives: Anti-infectives   Start     Dose/Rate Route Frequency Ordered Stop   09/01/12 1000  rifampin (RIFADIN) capsule 300 mg     300 mg Oral Every 12 hours 08/31/12 2038     08/31/12 0930  gentamicin (GARAMYCIN) 80 mg in sodium chloride irrigation 0.9 % 500 mL irrigation   Status:  Discontinued       As needed 08/31/12 0931 08/31/12 1004   08/31/12 0630  gentamicin (GARAMYCIN) 80 mg in sodium chloride irrigation 0.9 % 500 mL irrigation  Status:  Discontinued     80 mg Irrigation On call 08/31/12 0627 08/31/12 1149   08/31/12 0630  ceFAZolin (ANCEF) IVPB 2 g/50 mL premix     2 g 100 mL/hr over 30 Minutes Intravenous On call 08/31/12 0627 08/31/12 0913   08/28/12 1400  ceFAZolin (ANCEF) IVPB 1 g/50 mL premix     1 g 100 mL/hr over 30 Minutes Intravenous 3 times per day 08/28/12 1136     08/28/12 0400  vancomycin (VANCOCIN) 1,500 mg in sodium chloride 0.9 % 500 mL IVPB  Status:  Discontinued     1,500 mg 250 mL/hr over 120 Minutes Intravenous Every 24 hours 08/27/12 0431 08/29/12 1425   08/27/12 0445  vancomycin (VANCOCIN) 2,000 mg in sodium chloride 0.9 % 500 mL IVPB     2,000 mg 250 mL/hr over 120 Minutes Intravenous  Once 08/27/12 0431 08/27/12 0722   08/26/12 1000  cefTRIAXone (ROCEPHIN) 1 g in dextrose 5 % 50 mL IVPB  Status:  Discontinued     1 g 100 mL/hr over 30 Minutes Intravenous Every 24 hours 08/26/12 0913 08/27/12 1610      Assessment/Plan: Infected rp hematoma  Have seen IR and ortho plans.  Continue drains.  I will sign off please call back if needed doubt he will need operative intervention

## 2012-09-01 NOTE — Progress Notes (Signed)
Patient ID: [name], male   DOB: 02/01/1930, 79 y.o.   MRN: [id number] S: Patient feeling depressed because of persistent back pain, and multiple procedures. Reports little if any pain in his right hip. CT scan has been reviewed again and after discussing options with Mr. Jaye all reattempt aspiration of the right thigh region going 2 inches distal to the hip joint and just posterior to the femur to obtain a fluid specimen.  O: Once again log roll did not cause any pain to the patient's foot tap was negative firing the abductors cause no discomfort as well he still has weakness to flexion consistent with a psoas abscess. Patient was placed in the left lateral decubitus position at the bedside and the lateral aspect of the right thigh was sterilely prepped with alcohol and anesthetized with ethyl chloride. 2.5 inches distal to the tip of the greater trochanter and just posterior to the femur an 18-gauge needle was advanced through the skin and subcutaneous tissue and through the IT band. 10 cc of bloody turbid purulent fluid were obtained without difficulty. The needle was withdrawn there was no active bleeding and a Band-Aid was applied. Fluid was sent for Gram stain and cultures  A: At a minimum he has an abscess below the iliotibial band of the nail track down along the so as tendon. I am still not sure if the infection is ashy made into the hip joint as the attempted aspirate on Monday was basically dry.  P:  I have scheduled him for a formal irrigation and debridement of the right total hip wound.If the hip capsule has been violated at surgery we will include a radical irrigation and debridement of the hip with a polyethylene liner exchange and a new femoral head. If the capsule is not dilated we will simply place a deep drains and the space between the iliotibial band and the vastus lateralis where the fluid collection was best seen on CT scan. Removal of all implants was also discussed, the  patient does not walled all implants removed at this time. 

## 2012-09-01 NOTE — Progress Notes (Signed)
[facility] PROGRESS NOTE  [name] ZOX:[id number] DOB: 25-May-1930 DOA: 08/25/2012 PCP: Malka So., MD  Assessment/Plan: #1 MSSA bacteremia  CT of the hip abdomen and pelvis concerning for complex fluid collection of the left hip and infected retroperitoneal hematoma. Continue IV cefazolin as per ID recommendations.  -rifamipin started 08/31/12 -infected retroperitoneal fluid collection-->MSSA -TEE--no vegetations  -08/31/12 PPM removed--appreciate Dr. Ladona Ridgel -concerned about right prosthetic hip--09/01/12 aspirate with GPC (clusters+chains)  -patient remains undecided regarding possibility of hip explantation--he wishes to speak with family tonight -agree with chronic abx suppression if hip not explanted  - 8/3 blood cultures negative  #2 Klebsiella ozaenae bacteruria  -Urinalysis did not show pyuria  #3 Large retroperitoneal complex fluid collection and complex fluid collection of the right hip abscess/hematoma  Patient noted to have an INR as high as 7.19 on day of admission. INR has been reversed. CT of the hip and CT of the abdomen and pelvis worrisome for complex fluid collection around the right hip and also retroperitoneal hematoma. Gen. surgery and orthopedics have been consulted. Interventional radiology s/p aspiration and drainage 08/29/12. #4 right lower extremity weakness and back pain  Likely secondary to infected retroperitoneal hematoma and possible complex fluid collection noted around the right hip #5 acute renal failure/dehydration/hypokalemia  -improving  -Likely combination of hypovolemia and ATN  -Patient also noted to be in Zaroxolyn which is the likely etiology of this is hypokalemia. Replete potassium. Continue daily potassium.  #6 supratherapeutic INR  -Deferred need on chronic anticoagulation to cardiology  Status post vitamin K doses x2. Status post FFP. INR trending back up. Patient likely has a retroperitoneal hematoma and a such will hold off on  anticoagulations for now.  -remain off anticoagulation for nowin anticipation for surgery on 09/03/1998 410 -Will need to discuss with family regarding risks and benefits of restarting warfarin #7 leukocytosis  -Improving on abx  #8 right-sided abdominal tenderness  Clinical improvement. Likely secondary to retroperitoneal hematoma. Abdominal ultrasound and abdominal films negative for any acute abnormalities. Abdominal ultrasound positive for hepatic steatosis. Follow. Pain management. Supportive care.  #9 atrial fibrillation  Continue beta blocker for rate control. INR has been reversed -Patient is status post permanent pacemaker. #10 normocytic anemia  Stable. Follow H&H.  #11 type 2 diabetes  Continue sliding scale insulin.  Family Communication: Wife and daughter at beside  Disposition Plan: SNF when medically stable  Antibiotics:  IV vancomycin 08/26/2012--->08/29/12  IV Rocephin 08/26/2012---. 08/27/12  IV Ancef 08/28/2012          Procedures/Studies: Ct Abdomen Pelvis Wo Contrast  08/28/2012   *RADIOLOGY REPORT*  Clinical Data: Evaluate abscess.  CT ABDOMEN AND PELVIS WITHOUT CONTRAST  Technique:  Multidetector CT imaging of the abdomen and pelvis was performed following the standard protocol without intravenous contrast.  Comparison: CT of the right hip from the same date.  Findings:  BODY WALL: Unremarkable.  LOWER CHEST:  Mediastinum: Single chamber right ventricular pacer.  Coronary artery atherosclerosis.  Lungs/pleura: Small bilateral pleural effusions and associated atelectasis.  ABDOMEN/PELVIS:  Liver: No focal abnormality.  Biliary: Layering stones.  No evidence of acute cholecystitis.  Pancreas: Fatty atrophy.  Spleen: Unremarkable.  Adrenals: Unremarkable.  Kidneys and ureters: There are two exophytic right renal lesions in the interpolar region.  The largest measures 2.6 cm and is compatible with cyst - confirmed by recent sonography.  The second measures 15 mm and is  soft tissue density. This was not definitely seen on prior ultrasound. This lesion may have grown  mildly since imaging 12020/08/2911.  No hydronephrosis or stone.  Bladder: Mild distension by urine.  Bowel:  No definite communication between the bowel and retroperitoneal collection.  Unfortunately, enteric contrast has not yet reached the colon at time of imaging.   Normal appendix.  Retroperitoneum:Extensive right retroperitoneal hematoma gas and fluid collection, involving the lower posterior pararenal space, the right iliacus muscle, proximal right rectus femoris, iliopsoas bursa.  The largest posterior pararenal component is at the pelvic brim, measuring 9 x 4 by 6 cm.  The psoas muscle is spared.  No evidence of epidural extension of infection.  The neighboring osseous structures show no evidence of osteomyelitis.  Evaluation of the total right hip prosthesis was better accomplished on dedicated CT imaging. There is a relatively large collection posterior to the right femoral neck and greater trochanter, containing fluid and gas, measuring 7 x 3 x 3 cm. High density material present within the expanded right iliacus, measuring approximately 5 cm in diameter.  Peritoneum: No free fluid or gas.  Reproductive: Prostate enlargement calcifications.  Vascular: Extensive atherosclerotic calcification.  OSSEOUS: Total right hip prosthesis with surrounding fluid collections, as above.  Extensive degenerative disc disease with multilevel fusion and laminectomy.  No evidence of disc space infection. No suspicious lytic or blastic lesions.  IMPRESSION:  1.  Extensive complex fluid collection, likely abscess, involving the lower right retroperitoneum and right periarticular hip - size and extent described above. Although there is extensive gas within the collection, no bowel communication identified.  Probable hematoma within the iliacus component.  2. Indeterminate, possibly solid 15 mm lesion in the interpolar right kidney.   Followup imaging advised.  3. Cholelithiasis.   Original Report Authenticated By: Tiburcio Pea   Dg Chest 2 View  09/01/2012   *RADIOLOGY REPORT*  Clinical Data: Interval removal of cardiac pacer apparatus.  CHEST - 2 VIEW  Comparison: Chest radiograph the 02/15/2012  Findings: Stable enlarged cardiac and mediastinal contours with calcification of the transverse thoracic aorta.  No consolidative pulmonary opacities.  No pleural effusion or pneumothorax. Mid thoracic spine degenerative change.  IMPRESSION: No acute cardiopulmonary process.   Original Report Authenticated By: Annia Belt, M.D   Dg Hip Complete Right  08/28/2012   *RADIOLOGY REPORT*  Clinical Data: 79 year old male with right hip pain.  RIGHT HIP - COMPLETE 2+ VIEW  Comparison: 11/26/2010 radiographs  Findings: Right total hip replacement again identified. There is no evidence of fracture, subluxation or dislocation. No complicating features are identified.  IMPRESSION: Right total hip replacement without acute abnormality.   Original Report Authenticated By: Harmon Pier, M.D.   Ct Lumbar Spine Wo Contrast  08/27/2012   *RADIOLOGY REPORT*  Clinical Data: Low back/right leg pain, fever, positive blood cultures  CT LUMBAR SPINE WITHOUT CONTRAST  Technique:  Multidetector CT imaging of the lumbar spine was performed without intravenous contrast administration. Multiplanar CT image reconstructions were also generated.  Comparison: CT myelogram dated 02/10/2012.  Findings: No evidence of fracture or dislocation.  Vertebral body heights are maintained.  Postsurgical changes related to prior T12-L1 posterior laminectomy/discectomy and prior L4-S1 posterior laminectomy.  Prior L3-4 fusion.  Moderate to severe multilevel degenerative changes, unchanged from prior CT myelogram.  This includes mild to moderate central canal stenosis of the upper lumbar spine and moderate to severe central canal stenosis of the lower lumbar spine with nerve root encroachment  at multiple levels.  No evidence of epidural fluid collection/abscess.  Complex fluid collection in the right abdomen/pelvis, incompletely visualized.  IMPRESSION:  Postsurgical changes involving the lower thoracolumbar spine.  Stable moderate to severe multilevel degenerative changes, unchanged from prior CT myelogram.  No evidence of epidural fluid collection/abscess.  Complex fluid collection in the right abdomen/pelvis, incompletely visualized.  Please refer to CT right hip report for further assessment.   Original Report Authenticated By: Charline Bills, M.D.   US Abdomen Complete  08/26/2012   *RADIOLOGY REPORT*  Clinical Data:  79 year old with abnormal liver function tests.  COMPLETE ABDOMINAL ULTRASOUND  Comparison:  Unenhanced CT abdomen and pelvis 01/20/2012 [place].  Abdominal ultrasound 05/21/2011 [facility].  Findings:  Gallbladder:  Small shadowing gallstones.  Patient unable to turn into the lateral decubitus position to confirm movement of the stones, but these were present on the prior ultrasound examination. No gallbladder wall thickening or pericholecystic fluid.  Negative sonographic Murphy's sign according to the ultrasound technologist.  Common bile duct:  Normal in caliber with maximum diameter approximating 3 mm.  Liver:  Diffusely increased and coarsened echotexture without focal parenchymal abnormality.  Patent portal vein with hepatopetal flow.  IVC:  Patent in its intrahepatic portion.  Obscured outside the liver by bowel gas.  Pancreas:  Obscured by midline bowel gas and therefore not evaluated.  Spleen:  Normal size and echotexture without focal parenchymal abnormality.  Right Kidney:  No hydronephrosis.  Well-preserved cortex for age. Cysts arising from the mid and lower pole as noted previously, the cyst arising from the mid kidney approximating 2.8 cm and the cyst arising from the lower pole approximating 3.1 cm.  No solid renal masses.  Normal parenchymal  echotexture.  Approximately 12.0 cm in length.  Left Kidney:  No hydronephrosis.  Well-preserved cortex for age. Normal parenchymal echotexture.  No focal parenchymal abnormality. Approximately 10.7 cm in length.  Abdominal aorta:  Obscured by midline bowel gas and therefore not evaluated.  IMPRESSION:  1.  Cholelithiasis.  No sonographic evidence of acute cholecystitis.  No biliary ductal dilation. 2.  Diffuse hepatic steatosis without focal hepatic parenchymal abnormality. 3.  Midline structures (pancreas, extrahepatic IVC, and abdominal aorta) obscured by overlying bowel gas and therefore not evaluated.   Original Report Authenticated By: Hulan Saas, M.D.   Ct Hip Right Wo Contrast  08/27/2012   *RADIOLOGY REPORT*  Clinical Data: Low back/right leg pain, fever, positive blood cultures  CT OF THE RIGHT HIP WITHOUT CONTRAST  Technique:  Multidetector CT imaging was performed according to the standard protocol. Multiplanar CT image reconstructions were also generated.  Comparison: CT abdomen pelvis dated 01/20/2012.  Findings: Right total hip arthroplasty.  No evidence of fracture or dislocation.  Complex fluid collection/debris involving the right hip (series 5/image 36), right iliopsoas/iliacus muscles (series 5/images 9 and 23), and the visualized right lower retroperitoneum (series 5/image 1).  Representative axial measurements include: --3.8 x 8.3 cm in the right retroperitoneum (series 5/image 5) --9.2 x 5.6 cm in the right iliacus muscle (series 5/image 13) --12.7 x 7.3 cm along the right hip (series 5/image 37)  Based on this study, it is unclear whether this reflects a right hip infection which secondarily involves the lower abdomen/pelvis or whether this originated in the lower abdomen/pelvis and now extends to the right hip.  IMPRESSION: Right total hip arthroplasty.  Complex fluid collection/debris involving the right hip, right iliopsoas/iliacus muscles, and visualized right lower  retroperitoneum, as described above.  Based on this study, it is unclear whether this reflects a right hip infection which secondarily involves the lower abdomen/pelvis (favored) or whether this originated  in the lower abdomen/pelvis and now extends to the right hip.  Consider CT abdomen/pelvis (preferably with half dose contrast) for further evaluation.  These results were called by telephone on 08/27/2012 at 2000 hrs to Dr. Tressie Stalker, who verbally acknowledged these results.   Original Report Authenticated By: Charline Bills, M.D.   Dg Chest Port 1 View  08/26/2012   *RADIOLOGY REPORT*  Clinical Data: Elevated white blood count  PORTABLE CHEST - 1 VIEW  Comparison: November 11, 2010.  Findings: Stable cardiomediastinal silhouette.  Left-sided pacemaker is unchanged in position.  No acute pulmonary disease is noted.  No pneumothorax or pleural effusion is noted.  Degenerative joint disease is seen involving both shoulders.  IMPRESSION: No acute cardiopulmonary abnormality seen.   Original Report Authenticated By: Lupita Raider.,  M.D.   Dg Abd Portable 2v  08/26/2012   *RADIOLOGY REPORT*  Clinical Data: Abdominal pain.  PORTABLE ABDOMEN - 2 VIEW  Comparison: [facility] images for CT scan of the lumbar spine dated 02/10/2012  Findings: There is no free air in the abdomen.  There is air scattered throughout nondistended loops of large and small bowel. No excessive stool in the colon.  Diffuse degenerative changes of the lumbar spine.  Right hip prosthesis.  IMPRESSION: No acute abnormalities.   Original Report Authenticated By: Francene Boyers, M.D.   Ct Image Guided Fluid Drain By Catheter  08/29/2012   *RADIOLOGY REPORT*  Clinical Data: Right-sided retroperitoneal fluid collections containing air.  CT GUIDED DRAINAGE OF RETROPERITONEAL ABSCESS x 2  Sedation:  2.0 mg IV Versed;  50 mcg IV Fentanyl  Total Moderate Sedation Time: 36 minutes.  Procedure:  The procedure, risks, benefits, and alternatives were  explained to the patient.  Questions regarding the procedure were encouraged and answered. The patient understands and consents to the procedure.  The right lateral abdominal wall was prepped with Betadine in a sterile fashion, and a sterile drape was applied covering the operative field.  A sterile gown and sterile gloves were used for the procedure. Local anesthesia was provided with 1% Lidocaine.  CT was performed in a supine position with the right side rolled up slightly.  Under CT guidance, two separate 18 gauge trocar needles were advanced into both anterior and posterior retroperitoneal fluid collections on the right.  Fluid aspiration was performed and a fluid sample sent for culture analysis.  Guide wires were advanced into the two dominant pockets.  Tracts were dilated and 12-French percutaneous drainage catheters placed. Catheter position was confirmed by CT.  The catheters were flushed with saline and connected to suction bulbs.  Both catheters were secured at the skin with Prolene retention sutures and adhesive Stat-Lock devices.  Complications: None  Findings: It did appear that the dominant retroperitoneal fluid collections do not appear to show good communication with two dominant pockets identified.  Aspiration at the level of the anterior dominant pocket revealed liquefied blood which was not grossly turbid.  Aspiration of the dominant posterior retroperitoneal collection in the iliac fossa yielded dark partially liquefied blood.  12-French drains were placed in both collections.  The more anterior collection shows much more significant initial return of liquefied blood.  The posterior collection is slowly draining after flushing the drainage catheter.  IMPRESSION: Both anterior and posterior dominant right retroperitoneal collections were aspirated and treated with placement of indwelling 12-French drains.  The more anterior collection demonstrated free return of liquefied blood.  The more  posterior collection yielded much darker old blood product with  slow return.  Both drains were connected to suction bulbs and will be flushed every 8 hours.   Original Report Authenticated By: Irish Lack, M.D.   Ct Image Guided Fluid Drain By Catheter  08/29/2012   *RADIOLOGY REPORT*  Clinical Data: Right-sided retroperitoneal fluid collections containing air.  CT GUIDED DRAINAGE OF RETROPERITONEAL ABSCESS x 2  Sedation:  2.0 mg IV Versed;  50 mcg IV Fentanyl  Total Moderate Sedation Time: 36 minutes.  Procedure:  The procedure, risks, benefits, and alternatives were explained to the patient.  Questions regarding the procedure were encouraged and answered. The patient understands and consents to the procedure.  The right lateral abdominal wall was prepped with Betadine in a sterile fashion, and a sterile drape was applied covering the operative field.  A sterile gown and sterile gloves were used for the procedure. Local anesthesia was provided with 1% Lidocaine.  CT was performed in a supine position with the right side rolled up slightly.  Under CT guidance, two separate 18 gauge trocar needles were advanced into both anterior and posterior retroperitoneal fluid collections on the right.  Fluid aspiration was performed and a fluid sample sent for culture analysis.  Guide wires were advanced into the two dominant pockets.  Tracts were dilated and 12-French percutaneous drainage catheters placed. Catheter position was confirmed by CT.  The catheters were flushed with saline and connected to suction bulbs.  Both catheters were secured at the skin with Prolene retention sutures and adhesive Stat-Lock devices.  Complications: None  Findings: It did appear that the dominant retroperitoneal fluid collections do not appear to show good communication with two dominant pockets identified.  Aspiration at the level of the anterior dominant pocket revealed liquefied blood which was not grossly turbid.  Aspiration of the  dominant posterior retroperitoneal collection in the iliac fossa yielded dark partially liquefied blood.  12-French drains were placed in both collections.  The more anterior collection shows much more significant initial return of liquefied blood.  The posterior collection is slowly draining after flushing the drainage catheter.  IMPRESSION: Both anterior and posterior dominant right retroperitoneal collections were aspirated and treated with placement of indwelling 12-French drains.  The more anterior collection demonstrated free return of liquefied blood.  The more posterior collection yielded much darker old blood product with slow return.  Both drains were connected to suction bulbs and will be flushed every 8 hours.   Original Report Authenticated By: Irish Lack, M.D.         Subjective: Patient denies fevers, chills, chest pain, shortness breath, nausea, vomiting, diarrhea, abdominal pain. Still complains of some pain in the right lower quadrant and weakness in the right hip.  Objective: Filed Vitals:   09/01/12 0555 09/01/12 1036 09/01/12 1454 09/01/12 1835  BP: 130/70 132/72 124/67 140/73  Pulse: 78 92 83 81  Temp: 98.1 F (36.7 C) 98.8 F (37.1 C) 98.3 F (36.8 C) 98.4 F (36.9 C)  TempSrc: Oral Oral Oral Oral  Resp: 18 20 20 20   Height:      Weight:      SpO2: 99% 98% 94% 99%    Intake/Output Summary (Last 24 hours) at 09/01/12 1916 Last data filed at 09/01/12 1452  Gross per 24 hour  Intake     20 ml  Output   1915 ml  Net  -1895 ml   Weight change:  Exam:   General:  Pt is alert, follows commands appropriately, not in acute distress  HEENT: No icterus, No  thrush, [place]/AT  Cardiovascular: IRRR, S1/S2, no rubs, no gallops  Respiratory: CTA bilaterally, no wheezing, no crackles, no rhonchi  Abdomen: Soft/+BS, non tender, non distended, no guarding  Extremities: trace edema, No lymphangitis, No petechiae, No rashes, no synovitis  Data Reviewed: Basic  Metabolic Panel:  Recent Labs Lab 08/26/12 1120  08/27/12 1435 08/28/12 0035 08/29/12 0540 08/30/12 0600 09/01/12 0620  NA 134*  < > 138 134* 137 136 135  K 2.7*  < > 3.7 3.0* 3.6 3.7 3.8  CL 91*  < > 95* 93* 93* 94* 95*  CO2 33*  < > 35* 33* 34* 32 31  GLUCOSE 139*  < > 152* 160* 141* 140* 100*  BUN 71*  < > 54* 45* 31* 28* 21  CREATININE 2.51*  < > 1.78* 1.51* 1.43* 1.28 1.16  CALCIUM 8.6  < > 8.7 8.5 8.7 8.8 9.0  MG 2.7*  --   --   --   --   --   --   < > = values in this interval not displayed. Liver Function Tests:  Recent Labs Lab 08/26/12 1120 08/27/12 0530 08/28/12 0035  AST 192* 117* 82*  ALT 78* 60* 49  ALKPHOS 89 97 94  BILITOT 0.7 1.2 1.3*  PROT 6.3 6.4 6.3  ALBUMIN 1.8* 2.3* 1.9*   No results found for this basename: LIPASE, AMYLASE,  in the last 168 hours No results found for this basename: AMMONIA,  in the last 168 hours CBC:  Recent Labs Lab 08/26/12 0748  08/27/12 0530 08/28/12 0035 08/29/12 0540 08/30/12 0600 09/01/12 0620  WBC  --   < > 17.0* 14.4* 15.4* 13.2* 14.1*  NEUTROABS 20.9*  --  15.3*  --  13.4* 11.2*  --   HGB  --   < > 9.9* 9.6* 10.2* 10.3* 10.6*  HCT  --   < > 28.7* 29.5* 30.9* 31.3* 31.9*  MCV  --   < > 88.3 89.7 90.4 89.9 89.9  PLT  --   < > 218 224 246 252 292  < > = values in this interval not displayed. Cardiac Enzymes: No results found for this basename: CKTOTAL, CKMB, CKMBINDEX, TROPONINI,  in the last 168 hours BNP: No components found with this basename: POCBNP,  CBG:  Recent Labs Lab 08/31/12 1612 08/31/12 2210 09/01/12 0709 09/01/12 1228 09/01/12 1708  GLUCAP 116* 127* 102* 141* 112*    Recent Results (from the past 240 hour(s))  URINE CULTURE     Status: None   Collection Time    08/26/12  7:32 AM      Result Value Range Status   Specimen Description URINE, CLEAN CATCH   Final   Special Requests NONE   Final   Culture  Setup Time 08/26/2012 21:40   Final   Colony Count >=100,000 COLONIES/ML   Final    Culture KLEBSIELLA OZAENAE   Final   Report Status 08/29/2012 FINAL   Final   Organism ID, Bacteria KLEBSIELLA OZAENAE   Final  CULTURE, BLOOD (ROUTINE X 2)     Status: None   Collection Time    08/26/12 11:20 AM      Result Value Range Status   Specimen Description BLOOD LEFT ARM   Final   Special Requests BOTTLES DRAWN AEROBIC AND ANAEROBIC 10CC   Final   Culture  Setup Time 08/26/2012 16:54   Final   Culture     Final   Value: STAPHYLOCOCCUS AUREUS  Note: SUSCEPTIBILITIES PERFORMED ON PREVIOUS CULTURE WITHIN THE LAST 5 DAYS.     2 Note: Gram Stain Report Called to,Read Back By and Verified With: KELSEY [place] RN ON8 14 @420  EDMOJ   Report Status 08/29/2012 FINAL   Final  CULTURE, BLOOD (ROUTINE X 2)     Status: None   Collection Time    08/26/12 11:30 AM      Result Value Range Status   Specimen Description BLOOD LEFT HAND   Final   Special Requests     Final   Value: BOTTLES DRAWN AEROBIC AND ANAEROBIC AERO 10CC ANA 5CC   Culture  Setup Time 08/26/2012 16:54   Final   Culture     Final   Value: STAPHYLOCOCCUS AUREUS     Note: RIFAMPIN AND GENTAMICIN SHOULD NOT BE USED AS SINGLE DRUGS FOR TREATMENT OF STAPH INFECTIONS.     2 Note: Gram Stain Report Called to,Read Back By and Verified With: KELSEY [place] RN ON8 14 @420  EDMOJ   Report Status 08/29/2012 FINAL   Final   Organism ID, Bacteria STAPHYLOCOCCUS AUREUS   Final  CULTURE, BLOOD (ROUTINE X 2)     Status: None   Collection Time    08/28/12 12:45 AM      Result Value Range Status   Specimen Description BLOOD RIGHT HAND   Final   Special Requests BOTTLES DRAWN AEROBIC AND ANAEROBIC 10CC   Final   Culture  Setup Time 08/28/2012 15:05   Final   Culture     Final   Value:        BLOOD CULTURE RECEIVED NO GROWTH TO DATE CULTURE WILL BE HELD FOR 5 DAYS BEFORE ISSUING A FINAL NEGATIVE REPORT   Report Status PENDING   Incomplete  CULTURE, BLOOD (ROUTINE X 2)     Status: None   Collection Time    08/28/12 12:55 AM       Result Value Range Status   Specimen Description BLOOD RIGHT HAND   Final   Special Requests BOTTLES DRAWN AEROBIC ONLY 10CC   Final   Culture  Setup Time 08/28/2012 15:05   Final   Culture     Final   Value:        BLOOD CULTURE RECEIVED NO GROWTH TO DATE CULTURE WILL BE HELD FOR 5 DAYS BEFORE ISSUING A FINAL NEGATIVE REPORT   Report Status PENDING   Incomplete  CULTURE, ROUTINE-ABSCESS     Status: None   Collection Time    08/29/12  3:01 PM      Result Value Range Status   Specimen Description ABSCESS   Final   Special Requests RIGHT RETROPERITONEAL HEMATOMA   Final   Gram Stain     Final   Value: MODERATE WBC PRESENT, PREDOMINANTLY PMN     NO SQUAMOUS EPITHELIAL CELLS SEEN     MODERATE GRAM POSITIVE COCCI IN CLUSTERS     Performed at [facility]   Culture     Final   Value: ABUNDANT STAPHYLOCOCCUS AUREUS     Note: RIFAMPIN AND GENTAMICIN SHOULD NOT BE USED AS SINGLE DRUGS FOR TREATMENT OF STAPH INFECTIONS.     Performed at [facility]   Report Status 09/01/2012 FINAL   Final   Organism ID, Bacteria STAPHYLOCOCCUS AUREUS   Final  ANAEROBIC CULTURE     Status: None   Collection Time    08/29/12  3:01 PM      Result Value Range Status   Specimen Description  ABSCESS   Final   Special Requests RIGHT RETROPERITONEAL HEMATOMA   Final   Gram Stain     Final   Value: MODERATE WBC PRESENT, PREDOMINANTLY PMN     NO SQUAMOUS EPITHELIAL CELLS SEEN     M0 GRAM POSITIVE COCCI     IN PAIRS INCISION     Performed at [facility]   Culture     Final   Value: NO ANAEROBES ISOLATED; CULTURE IN PROGRESS FOR 5 DAYS     Performed at [facility]   Report Status PENDING   Incomplete  SURGICAL PCR SCREEN     Status: None   Collection Time    08/31/12  7:00 AM      Result Value Range Status   MRSA, PCR NEGATIVE  NEGATIVE Final   Staphylococcus aureus NEGATIVE  NEGATIVE Final   Comment:            The Xpert SA Assay (FDA     approved for NASAL specimens      in patients over 22 years of age),     is one component of     a comprehensive surveillance     program.  Test performance has     been validated by [employer] [facility] for patients greater     than or equal to 67 year old.     It is not intended     to diagnose infection nor to     guide or monitor treatment.  GRAM STAIN     Status: None   Collection Time    09/01/12  9:08 AM      Result Value Range Status   Specimen Description ABSCESS HIP RIGHT   Final   Special Requests RIGHT HIP MUSCULAR ABSCESS   Final   Gram Stain     Final   Value: ABUNDANT WBC PRESENT,BOTH PMN AND MONONUCLEAR     MODERATE GRAM POSITIVE COCCI IN PAIRS IN CLUSTERS     CALLED TO A RODRIGUEZ,RN 09/01/12 0955 BY K SCHULTZ   Report Status 09/01/2012 FINAL   Final     Scheduled Meds: .  ceFAZolin (ANCEF) IV  1 g Intravenous Q8H  . finasteride  5 mg Oral Daily  . insulin aspart  0-9 Units Subcutaneous TID WC  . levothyroxine  75 mcg Oral QAC breakfast  . metolazone  2.5 mg Oral Q M,W,F  . metoprolol succinate  12.5 mg Oral Daily  . potassium chloride  40 mEq Oral Daily  . rifampin  300 mg Oral Q12H   Continuous Infusions:    Calleen Alvis, DO  [facility] Pager [phone]  If 7PM-7AM, please contact night-coverage [email or web address] Password [email or web address] 09/01/2012, 7:16 PM   LOS: 7 days

## 2012-09-01 NOTE — Progress Notes (Signed)
1 Day Post-Op  Subjective: Pt s/p aspiration below right IT band this am by Dr. Kyla Balzarine yielding turbid bloody purulent fluid. Dr. Turner Daniels plan for OR tomorrow is noted; states he is feeling a bit better with decreased back/rt hip pain  Objective: Vital signs in last 24 hours: Temp:  [97.4 F (36.3 C)-98.3 F (36.8 C)] 98.1 F (36.7 C) (08/07 0555) Pulse Rate:  [66-131] 78 (08/07 0555) Resp:  [11-21] 18 (08/07 0555) BP: (105-153)/(45-85) 130/70 mmHg (08/07 0555) SpO2:  [94 %-100 %] 99 % (08/07 0555) Arterial Line BP: (158-170)/(55-59) 158/56 mmHg (08/06 1115) Last BM Date: 08/31/12   Rt RP drains intact, insertion sites ok;  Drain #1 output has diminished, only 15cc-18cc per day past 2 days Drain #2 output has remained steady with at least 80cc per day.  Lab Results:   Recent Labs  08/30/12 0600 09/01/12 0620  WBC 13.2* 14.1*  HGB 10.3* 10.6*  HCT 31.3* 31.9*  PLT 252 292   BMET  Recent Labs  08/30/12 0600 09/01/12 0620  NA 136 135  K 3.7 3.8  CL 94* 95*  CO2 32 31  GLUCOSE 140* 100*  BUN 28* 21  CREATININE 1.28 1.16  CALCIUM 8.8 9.0   PT/INR  Recent Labs  08/31/12 0720 09/01/12 0620  LABPROT 19.2* 15.3*  INR 1.67* 1.24   ABG No results found for this basename: PHART, PCO2, PO2, HCO3,  in the last 72 hours  Studies/Results: Dg Chest 2 View  09/01/2012   *RADIOLOGY REPORT*  Clinical Data: Interval removal of cardiac pacer apparatus.  CHEST - 2 VIEW  Comparison: Chest radiograph the 02/15/2012  Findings: Stable enlarged cardiac and mediastinal contours with calcification of the transverse thoracic aorta.  No consolidative pulmonary opacities.  No pleural effusion or pneumothorax. Mid thoracic spine degenerative change.  IMPRESSION: No acute cardiopulmonary process.   Original Report Authenticated By: Annia Belt, M.D   Results for orders placed during the hospital encounter of 08/25/12  URINE CULTURE     Status: None   Collection Time    08/26/12  7:32 AM     Result Value Range Status   Specimen Description URINE, CLEAN CATCH   Final   Special Requests NONE   Final   Culture  Setup Time 08/26/2012 21:40   Final   Colony Count >=100,000 COLONIES/ML   Final   Culture KLEBSIELLA OZAENAE   Final   Report Status 08/29/2012 FINAL   Final   Organism ID, Bacteria KLEBSIELLA OZAENAE   Final  CULTURE, BLOOD (ROUTINE X 2)     Status: None   Collection Time    08/26/12 11:20 AM      Result Value Range Status   Specimen Description BLOOD LEFT ARM   Final   Special Requests BOTTLES DRAWN AEROBIC AND ANAEROBIC 10CC   Final   Culture  Setup Time 08/26/2012 16:54   Final   Culture     Final   Value: STAPHYLOCOCCUS AUREUS     Note: SUSCEPTIBILITIES PERFORMED ON PREVIOUS CULTURE WITHIN THE LAST 5 DAYS.     2 Note: Gram Stain Report Called to,Read Back By and Verified With: KELSEY [place] RN ON8 14 @420  EDMOJ   Report Status 08/29/2012 FINAL   Final  CULTURE, BLOOD (ROUTINE X 2)     Status: None   Collection Time    08/26/12 11:30 AM      Result Value Range Status   Specimen Description BLOOD LEFT HAND   Final   Special  Requests     Final   Value: BOTTLES DRAWN AEROBIC AND ANAEROBIC AERO 10CC ANA 5CC   Culture  Setup Time 08/26/2012 16:54   Final   Culture     Final   Value: STAPHYLOCOCCUS AUREUS     Note: RIFAMPIN AND GENTAMICIN SHOULD NOT BE USED AS SINGLE DRUGS FOR TREATMENT OF STAPH INFECTIONS.     2 Note: Gram Stain Report Called to,Read Back By and Verified With: KELSEY [place] RN ON8 14 @420  EDMOJ   Report Status 08/29/2012 FINAL   Final   Organism ID, Bacteria STAPHYLOCOCCUS AUREUS   Final  CULTURE, BLOOD (ROUTINE X 2)     Status: None   Collection Time    08/28/12 12:45 AM      Result Value Range Status   Specimen Description BLOOD RIGHT HAND   Final   Special Requests BOTTLES DRAWN AEROBIC AND ANAEROBIC 10CC   Final   Culture  Setup Time 08/28/2012 15:05   Final   Culture     Final   Value:        BLOOD CULTURE RECEIVED NO GROWTH TO DATE  CULTURE WILL BE HELD FOR 5 DAYS BEFORE ISSUING A FINAL NEGATIVE REPORT   Report Status PENDING   Incomplete  CULTURE, BLOOD (ROUTINE X 2)     Status: None   Collection Time    08/28/12 12:55 AM      Result Value Range Status   Specimen Description BLOOD RIGHT HAND   Final   Special Requests BOTTLES DRAWN AEROBIC ONLY 10CC   Final   Culture  Setup Time 08/28/2012 15:05   Final   Culture     Final   Value:        BLOOD CULTURE RECEIVED NO GROWTH TO DATE CULTURE WILL BE HELD FOR 5 DAYS BEFORE ISSUING A FINAL NEGATIVE REPORT   Report Status PENDING   Incomplete  CULTURE, ROUTINE-ABSCESS     Status: None   Collection Time    08/29/12  3:01 PM      Result Value Range Status   Specimen Description ABSCESS   Final   Special Requests RIGHT RETROPERITONEAL HEMATOMA   Final   Gram Stain     Final   Value: MODERATE WBC PRESENT, PREDOMINANTLY PMN     NO SQUAMOUS EPITHELIAL CELLS SEEN     MODERATE GRAM POSITIVE COCCI IN CLUSTERS     Performed at [facility]   Culture     Final   Value: ABUNDANT STAPHYLOCOCCUS AUREUS     Note: RIFAMPIN AND GENTAMICIN SHOULD NOT BE USED AS SINGLE DRUGS FOR TREATMENT OF STAPH INFECTIONS.     Performed at [facility]   Report Status 09/01/2012 FINAL   Final   Organism ID, Bacteria STAPHYLOCOCCUS AUREUS   Final  ANAEROBIC CULTURE     Status: None   Collection Time    08/29/12  3:01 PM      Result Value Range Status   Specimen Description ABSCESS   Final   Special Requests RIGHT RETROPERITONEAL HEMATOMA   Final   Gram Stain     Final   Value: MODERATE WBC PRESENT, PREDOMINANTLY PMN     NO SQUAMOUS EPITHELIAL CELLS SEEN     M0 GRAM POSITIVE COCCI     IN PAIRS INCISION     Performed at [facility]   Culture     Final   Value: NO ANAEROBES ISOLATED; CULTURE IN PROGRESS FOR 5 DAYS  Performed at [facility]   Report Status PENDING   Incomplete  SURGICAL PCR SCREEN     Status: None   Collection Time    08/31/12  7:00 AM       Result Value Range Status   MRSA, PCR NEGATIVE  NEGATIVE Final   Staphylococcus aureus NEGATIVE  NEGATIVE Final   Comment:            The Xpert SA Assay (FDA     approved for NASAL specimens     in patients over 27 years of age),     is one component of     a comprehensive surveillance     program.  Test performance has     been validated by [employer] for patients greater     than or equal to 86 year old.     It is not intended     to diagnose infection nor to     guide or monitor treatment.    Anti-infectives: Anti-infectives   Start     Dose/Rate Route Frequency Ordered Stop   09/01/12 1000  rifampin (RIFADIN) capsule 300 mg     300 mg Oral Every 12 hours 08/31/12 2038     08/31/12 0930  gentamicin (GARAMYCIN) 80 mg in sodium chloride irrigation 0.9 % 500 mL irrigation  Status:  Discontinued       As needed 08/31/12 0931 08/31/12 1004   08/31/12 0630  gentamicin (GARAMYCIN) 80 mg in sodium chloride irrigation 0.9 % 500 mL irrigation  Status:  Discontinued     80 mg Irrigation On call 08/31/12 0627 08/31/12 1149   08/31/12 0630  ceFAZolin (ANCEF) IVPB 2 g/50 mL premix     2 g 100 mL/hr over 30 Minutes Intravenous On call 08/31/12 0627 08/31/12 0913   08/28/12 1400  ceFAZolin (ANCEF) IVPB 1 g/50 mL premix     1 g 100 mL/hr over 30 Minutes Intravenous 3 times per day 08/28/12 1136     08/28/12 0400  vancomycin (VANCOCIN) 1,500 mg in sodium chloride 0.9 % 500 mL IVPB  Status:  Discontinued     1,500 mg 250 mL/hr over 120 Minutes Intravenous Every 24 hours 08/27/12 0431 08/29/12 1425   08/27/12 0445  vancomycin (VANCOCIN) 2,000 mg in sodium chloride 0.9 % 500 mL IVPB     2,000 mg 250 mL/hr over 120 Minutes Intravenous  Once 08/27/12 0431 08/27/12 0722   08/26/12 1000  cefTRIAXone (ROCEPHIN) 1 g in dextrose 5 % 50 mL IVPB  Status:  Discontinued     1 g 100 mL/hr over 30 Minutes Intravenous Every 24 hours 08/26/12 0913 08/27/12 1610      Assessment/Plan: s/p rt RP  hematoma drainages 8/4; cont current tx, drain irrigations; check f/u CT in next 3-4 days. Plans noted for OR tomorrow. IR continuing to follow   LOS: 7 days    Brayton El 09/01/2012

## 2012-09-01 NOTE — Progress Notes (Signed)
[facility]  Cefazolin day # 5 Rifampin day #1   Subjective: No new complaints   Antibiotics:  Anti-infectives   Start     Dose/Rate Route Frequency Ordered Stop   09/01/12 1000  rifampin (RIFADIN) capsule 300 mg     300 mg Oral Every 12 hours 08/31/12 2038     08/31/12 0930  gentamicin (GARAMYCIN) 80 mg in sodium chloride irrigation 0.9 % 500 mL irrigation  Status:  Discontinued       As needed 08/31/12 0931 08/31/12 1004   08/31/12 0630  gentamicin (GARAMYCIN) 80 mg in sodium chloride irrigation 0.9 % 500 mL irrigation  Status:  Discontinued     80 mg Irrigation On call 08/31/12 0627 08/31/12 1149   08/31/12 0630  ceFAZolin (ANCEF) IVPB 2 g/50 mL premix     2 g 100 mL/hr over 30 Minutes Intravenous On call 08/31/12 0627 08/31/12 0913   08/28/12 1400  ceFAZolin (ANCEF) IVPB 1 g/50 mL premix     1 g 100 mL/hr over 30 Minutes Intravenous 3 times per day 08/28/12 1136     08/28/12 0400  vancomycin (VANCOCIN) 1,500 mg in sodium chloride 0.9 % 500 mL IVPB  Status:  Discontinued     1,500 mg 250 mL/hr over 120 Minutes Intravenous Every 24 hours 08/27/12 0431 08/29/12 1425   08/27/12 0445  vancomycin (VANCOCIN) 2,000 mg in sodium chloride 0.9 % 500 mL IVPB     2,000 mg 250 mL/hr over 120 Minutes Intravenous  Once 08/27/12 0431 08/27/12 0722   08/26/12 1000  cefTRIAXone (ROCEPHIN) 1 g in dextrose 5 % 50 mL IVPB  Status:  Discontinued     1 g 100 mL/hr over 30 Minutes Intravenous Every 24 hours 08/26/12 0913 08/27/12 1610      Medications: Scheduled Meds: .  ceFAZolin (ANCEF) IV  1 g Intravenous Q8H  . finasteride  5 mg Oral Daily  . insulin aspart  0-9 Units Subcutaneous TID WC  . levothyroxine  75 mcg Oral QAC breakfast  . metolazone  2.5 mg Oral Q M,W,F  . metoprolol succinate  12.5 mg Oral Daily  . potassium chloride  40 mEq Oral Daily  . rifampin  300 mg Oral Q12H   Continuous Infusions:   PRN Meds:.acetaminophen, acetaminophen, ALPRAZolam,  menthol-cetylpyridinium, morphine injection, ondansetron (ZOFRAN) IV, oxyCODONE-acetaminophen, phenol   Objective: Weight change:   Intake/Output Summary (Last 24 hours) at 09/01/12 1734 Last data filed at 09/01/12 1452  Gross per 24 hour  Intake     20 ml  Output   1915 ml  Net  -1895 ml   Blood pressure 124/67, pulse 83, temperature 98.3 F (36.8 C), temperature source Oral, resp. rate 20, height 5' 11.5" (1.816 m), weight 244 lb 1.6 oz (110.723 kg), SpO2 94.00%. Temp:  [97.4 F (36.3 C)-98.8 F (37.1 C)] 98.3 F (36.8 C) (08/07 1454) Pulse Rate:  [66-92] 83 (08/07 1454) Resp:  [17-20] 20 (08/07 1454) BP: (124-140)/(67-72) 124/67 mmHg (08/07 1454) SpO2:  [94 %-99 %] 94 % (08/07 1454)  Physical Exam: General: Alert and awake, oriented x3, not in any acute distress. HEENT: anicteric sclera, pupils reactive to light and accommodation, EOMI CVS regular rate, normal r,  no murmur rubs or gallops Chest: clear to auscultation bilaterally, no wheezing, rales or rhonchi Abdomen: soft nontender, nondistended, normal bowel sounds, Extremities: no  clubbing or edema noted bilaterally Skin: PM site clean  Neuro: difficult easing right leg due to pain  Lab Results:  Recent Labs  08/30/12 0600 09/01/12 0620  WBC 13.2* 14.1*  HGB 10.3* 10.6*  HCT 31.3* 31.9*  PLT 252 292    BMET  Recent Labs  08/30/12 0600 09/01/12 0620  NA 136 135  K 3.7 3.8  CL 94* 95*  CO2 32 31  GLUCOSE 140* 100*  BUN 28* 21  CREATININE 1.28 1.16  CALCIUM 8.8 9.0    Micro Results: Recent Results (from the past 240 hour(s))  URINE CULTURE     Status: None   Collection Time    08/26/12  7:32 AM      Result Value Range Status   Specimen Description URINE, CLEAN CATCH   Final   Special Requests NONE   Final   Culture  Setup Time 08/26/2012 21:40   Final   Colony Count >=100,000 COLONIES/ML   Final   Culture KLEBSIELLA OZAENAE   Final   Report Status 08/29/2012 FINAL   Final   Organism ID,  Bacteria KLEBSIELLA OZAENAE   Final  CULTURE, BLOOD (ROUTINE X 2)     Status: None   Collection Time    08/26/12 11:20 AM      Result Value Range Status   Specimen Description BLOOD LEFT ARM   Final   Special Requests BOTTLES DRAWN AEROBIC AND ANAEROBIC 10CC   Final   Culture  Setup Time 08/26/2012 16:54   Final   Culture     Final   Value: STAPHYLOCOCCUS AUREUS     Note: SUSCEPTIBILITIES PERFORMED ON PREVIOUS CULTURE WITHIN THE LAST 5 DAYS.     2 Note: Gram Stain Report Called to,Read Back By and Verified With: KELSEY [place] RN ON8 14 @420  EDMOJ   Report Status 08/29/2012 FINAL   Final  CULTURE, BLOOD (ROUTINE X 2)     Status: None   Collection Time    08/26/12 11:30 AM      Result Value Range Status   Specimen Description BLOOD LEFT HAND   Final   Special Requests     Final   Value: BOTTLES DRAWN AEROBIC AND ANAEROBIC AERO 10CC ANA 5CC   Culture  Setup Time 08/26/2012 16:54   Final   Culture     Final   Value: STAPHYLOCOCCUS AUREUS     Note: RIFAMPIN AND GENTAMICIN SHOULD NOT BE USED AS SINGLE DRUGS FOR TREATMENT OF STAPH INFECTIONS.     2 Note: Gram Stain Report Called to,Read Back By and Verified With: KELSEY [place] RN ON8 14 @420  EDMOJ   Report Status 08/29/2012 FINAL   Final   Organism ID, Bacteria STAPHYLOCOCCUS AUREUS   Final  CULTURE, BLOOD (ROUTINE X 2)     Status: None   Collection Time    08/28/12 12:45 AM      Result Value Range Status   Specimen Description BLOOD RIGHT HAND   Final   Special Requests BOTTLES DRAWN AEROBIC AND ANAEROBIC 10CC   Final   Culture  Setup Time 08/28/2012 15:05   Final   Culture     Final   Value:        BLOOD CULTURE RECEIVED NO GROWTH TO DATE CULTURE WILL BE HELD FOR 5 DAYS BEFORE ISSUING A FINAL NEGATIVE REPORT   Report Status PENDING   Incomplete  CULTURE, BLOOD (ROUTINE X 2)     Status: None   Collection Time    08/28/12 12:55 AM      Result Value Range Status   Specimen Description BLOOD RIGHT HAND  Final   Special Requests  BOTTLES DRAWN AEROBIC ONLY 10CC   Final   Culture  Setup Time 08/28/2012 15:05   Final   Culture     Final   Value:        BLOOD CULTURE RECEIVED NO GROWTH TO DATE CULTURE WILL BE HELD FOR 5 DAYS BEFORE ISSUING A FINAL NEGATIVE REPORT   Report Status PENDING   Incomplete  CULTURE, ROUTINE-ABSCESS     Status: None   Collection Time    08/29/12  3:01 PM      Result Value Range Status   Specimen Description ABSCESS   Final   Special Requests RIGHT RETROPERITONEAL HEMATOMA   Final   Gram Stain     Final   Value: MODERATE WBC PRESENT, PREDOMINANTLY PMN     NO SQUAMOUS EPITHELIAL CELLS SEEN     MODERATE GRAM POSITIVE COCCI IN CLUSTERS     Performed at [facility]   Culture     Final   Value: ABUNDANT STAPHYLOCOCCUS AUREUS     Note: RIFAMPIN AND GENTAMICIN SHOULD NOT BE USED AS SINGLE DRUGS FOR TREATMENT OF STAPH INFECTIONS.     Performed at [facility]   Report Status 09/01/2012 FINAL   Final   Organism ID, Bacteria STAPHYLOCOCCUS AUREUS   Final  ANAEROBIC CULTURE     Status: None   Collection Time    08/29/12  3:01 PM      Result Value Range Status   Specimen Description ABSCESS   Final   Special Requests RIGHT RETROPERITONEAL HEMATOMA   Final   Gram Stain     Final   Value: MODERATE WBC PRESENT, PREDOMINANTLY PMN     NO SQUAMOUS EPITHELIAL CELLS SEEN     M0 GRAM POSITIVE COCCI     IN PAIRS INCISION     Performed at [facility]   Culture     Final   Value: NO ANAEROBES ISOLATED; CULTURE IN PROGRESS FOR 5 DAYS     Performed at [facility]   Report Status PENDING   Incomplete  SURGICAL PCR SCREEN     Status: None   Collection Time    08/31/12  7:00 AM      Result Value Range Status   MRSA, PCR NEGATIVE  NEGATIVE Final   Staphylococcus aureus NEGATIVE  NEGATIVE Final   Comment:            The Xpert SA Assay (FDA     approved for NASAL specimens     in patients over 23 years of age),     is one component of     a comprehensive surveillance       program.  Test performance has     been validated by [employer] for patients greater     than or equal to 95 year old.     It is not intended     to diagnose infection nor to     guide or monitor treatment.  GRAM STAIN     Status: None   Collection Time    09/01/12  9:08 AM      Result Value Range Status   Specimen Description ABSCESS HIP RIGHT   Final   Special Requests RIGHT HIP MUSCULAR ABSCESS   Final   Gram Stain     Final   Value: ABUNDANT WBC PRESENT,BOTH PMN AND MONONUCLEAR     MODERATE GRAM POSITIVE COCCI IN PAIRS IN  CLUSTERS     CALLED TO A RODRIGUEZ,RN 09/01/12 0955 BY K SCHULTZ   Report Status 09/01/2012 FINAL   Final    Studies/Results: Dg Chest 2 View  09/01/2012   *RADIOLOGY REPORT*  Clinical Data: Interval removal of cardiac pacer apparatus.  CHEST - 2 VIEW  Comparison: Chest radiograph the 02/15/2012  Findings: Stable enlarged cardiac and mediastinal contours with calcification of the transverse thoracic aorta.  No consolidative pulmonary opacities.  No pleural effusion or pneumothorax. Mid thoracic spine degenerative change.  IMPRESSION: No acute cardiopulmonary process.   Original Report Authenticated By: Annia Belt, M.D      Assessment/Plan: [name] is a 79 y.o. male with  Pacemaker, Right prosthetic hip, Laminectomy in January of 2014 admitted with right leg pain weakness fevers. He has been found to have MSSA bacteremia and a large retroperitoneal abscess that is adjacent to his prosthetic hip.   Orthopedics is following closely and IR attempted aspirate of the hip --all dry taps. My understanding is that orthopedics was able to get tap of hip today with fluid   TEE failed to show vegetations on valves or overtly on PM. PM is now out.   #1 MSSA bacteremia with PM infection sp Extraction by Dr. Franchot Gallo APPRECIATE his critical assistance here  --Repeat blood cultures drawn post PM removal --We need to ensure control of source and  potential metastatic foci. I'm worried about this large abscess in his retroperitoneum near his  prosthetic hip.   --Certainly was no fluid that was sent in the hip joint, but I do worry about this being a night a total source and risk for persistent infection.   --I GREATLY apprecaite Dr. Turner Daniels taking the pt to the OR for exploration of this space and consideration of removal of prosthesis IF it does appear to be infected  --I am going to treat him with at least 6 weeks of IV cefazolin (along with Rifampin UNLESS ALL prosthetic material removed)  --I would delay re-implantaiton of PM for as long as possible preferably at least 4 weeks  --Pharmacy can we push to 2 grams IV ANCEF Q 8 HOURS?  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

## 2012-09-02 ENCOUNTER — Encounter (HOSPITAL_COMMUNITY): Payer: Self-pay | Admitting: *Deleted

## 2012-09-02 ENCOUNTER — Encounter (HOSPITAL_COMMUNITY)
Admission: AD | Disposition: A | Discharge status: Skilled Nursing Facility | Payer: Self-pay | Source: Ambulatory Visit | Attending: Internal Medicine

## 2012-09-02 ENCOUNTER — Inpatient Hospital Stay (HOSPITAL_COMMUNITY): Payer: Medicare Other

## 2012-09-02 ENCOUNTER — Inpatient Hospital Stay (HOSPITAL_COMMUNITY): Payer: Medicare Other | Admitting: Certified Registered"

## 2012-09-02 ENCOUNTER — Encounter (HOSPITAL_COMMUNITY): Payer: Self-pay | Admitting: Certified Registered"

## 2012-09-02 HISTORY — PX: INCISION AND DRAINAGE HIP: SHX1801

## 2012-09-02 HISTORY — PX: TOTAL HIP REVISION: SHX763

## 2012-09-02 LAB — PREPARE RBC (CROSSMATCH)

## 2012-09-02 LAB — GLUCOSE, CAPILLARY
Glucose-Capillary: 86 mg/dL (ref 70–99)
Glucose-Capillary: 83 mg/dL (ref 70–99)
Glucose-Capillary: 95 mg/dL (ref 70–99)

## 2012-09-02 LAB — PROTIME-INR: Prothrombin Time: 15.2 seconds (ref 11.6–15.2)

## 2012-09-02 LAB — BASIC METABOLIC PANEL
BUN: 19 mg/dL (ref 6–23)
Creatinine, Ser: 1.15 mg/dL (ref 0.50–1.35)
GFR calc Af Amer: 67 mL/min — ABNORMAL LOW (ref >90)
GFR calc non Af Amer: 58 mL/min — ABNORMAL LOW (ref >90)
Glucose, Bld: 86 mg/dL (ref 70–99)

## 2012-09-02 LAB — CBC
HCT: 32.1 % — ABNORMAL LOW (ref 39.0–52.0)
MCH: 29.3 pg (ref 26.0–34.0)
MCHC: 32.7 g/dL (ref 30.0–36.0)
RDW: 15.9 % — ABNORMAL HIGH (ref 11.5–15.5)

## 2012-09-02 SURGERY — IRRIGATION AND DEBRIDEMENT HIP
Anesthesia: General | Site: Hip | Laterality: Right | Wound class: Dirty or Infected

## 2012-09-02 MED ORDER — OXYCODONE HCL 5 MG PO TABS: 5.0000 mg | ORAL_TABLET | Freq: Once | ORAL | Status: AC | PRN

## 2012-09-02 MED ORDER — LIDOCAINE HCL (CARDIAC) 20 MG/ML IV SOLN
INTRAVENOUS | Status: DC | PRN
  Administered 2012-09-02: 100 mg via INTRAVENOUS

## 2012-09-02 MED ORDER — DEXTROSE 5 % IV SOLN
5.0000 mg/h | INTRAVENOUS | Status: DC
  Administered 2012-09-02: 5 mg/h via INTRAVENOUS
  Administered 2012-09-02 – 2012-09-03 (×3): 10 mg/h via INTRAVENOUS
  Filled 2012-09-02 (×2): qty 100

## 2012-09-02 MED ORDER — MIDAZOLAM HCL 2 MG/2ML IJ SOLN: 1.0000 mg | INTRAMUSCULAR | Status: DC | PRN

## 2012-09-02 MED ORDER — PROPOFOL 10 MG/ML IV BOLUS
INTRAVENOUS | Status: DC | PRN
  Administered 2012-09-02: 100 mg via INTRAVENOUS

## 2012-09-02 MED ORDER — LACTATED RINGERS IV SOLN
INTRAVENOUS | Status: DC | PRN
  Administered 2012-09-02 (×2): via INTRAVENOUS

## 2012-09-02 MED ORDER — METOCLOPRAMIDE HCL 5 MG PO TABS
5.0000 mg | ORAL_TABLET | Freq: Three times a day (TID) | ORAL | Status: DC | PRN
  Filled 2012-09-02: qty 2

## 2012-09-02 MED ORDER — HYDROMORPHONE HCL PF 1 MG/ML IJ SOLN
0.5000 mg | INTRAMUSCULAR | Status: DC | PRN
  Administered 2012-09-03: 0.5 mg via INTRAVENOUS
  Administered 2012-09-03 – 2012-09-04 (×2): 1 mg via INTRAVENOUS
  Filled 2012-09-02 (×2): qty 1

## 2012-09-02 MED ORDER — PHENYLEPHRINE HCL 10 MG/ML IJ SOLN
INTRAMUSCULAR | Status: DC | PRN
  Administered 2012-09-02 (×9): 80 ug via INTRAVENOUS

## 2012-09-02 MED ORDER — FENTANYL CITRATE 0.05 MG/ML IJ SOLN
INTRAMUSCULAR | Status: DC | PRN
  Administered 2012-09-02: 50 ug via INTRAVENOUS
  Administered 2012-09-02: 100 ug via INTRAVENOUS
  Administered 2012-09-02 (×2): 50 ug via INTRAVENOUS

## 2012-09-02 MED ORDER — BUPIVACAINE-EPINEPHRINE 0.5% -1:200000 IJ SOLN
INTRAMUSCULAR | Status: DC | PRN
  Administered 2012-09-02: 20 mL

## 2012-09-02 MED ORDER — ROCURONIUM BROMIDE 100 MG/10ML IV SOLN
INTRAVENOUS | Status: DC | PRN
  Administered 2012-09-02: 50 mg via INTRAVENOUS
  Administered 2012-09-02: 30 mg via INTRAVENOUS
  Administered 2012-09-02: 20 mg via INTRAVENOUS

## 2012-09-02 MED ORDER — METOCLOPRAMIDE HCL 5 MG/ML IJ SOLN
5.0000 mg | Freq: Three times a day (TID) | INTRAMUSCULAR | Status: DC | PRN
  Filled 2012-09-02: qty 2

## 2012-09-02 MED ORDER — WARFARIN - PHARMACIST DOSING INPATIENT
Freq: Every day | Status: DC
  Administered 2012-09-03: 18:00:00

## 2012-09-02 MED ORDER — CEFAZOLIN SODIUM 1-5 GM-% IV SOLN
1.0000 g | INTRAVENOUS | Status: DC
  Filled 2012-09-02: qty 50

## 2012-09-02 MED ORDER — SODIUM CHLORIDE 0.9 % IR SOLN
Status: DC | PRN
  Administered 2012-09-02: 3000 mL
  Administered 2012-09-02: 1000 mL

## 2012-09-02 MED ORDER — HYDROMORPHONE HCL PF 1 MG/ML IJ SOLN
0.2500 mg | INTRAMUSCULAR | Status: DC | PRN
  Filled 2012-09-02: qty 1

## 2012-09-02 MED ORDER — DOCUSATE SODIUM 100 MG PO CAPS
100.0000 mg | ORAL_CAPSULE | Freq: Two times a day (BID) | ORAL | Status: DC
  Administered 2012-09-03 – 2012-09-08 (×11): 100 mg via ORAL
  Filled 2012-09-02 (×12): qty 1

## 2012-09-02 MED ORDER — OXYCHLOROSENE SODIUM POWD
Freq: Once | Status: DC
  Filled 2012-09-02: qty 2

## 2012-09-02 MED ORDER — HYDROCODONE-ACETAMINOPHEN 5-325 MG PO TABS
1.0000 | ORAL_TABLET | ORAL | Status: DC | PRN
  Administered 2012-09-04 – 2012-09-05 (×2): 2 via ORAL
  Filled 2012-09-02 (×2): qty 2

## 2012-09-02 MED ORDER — OXYCHLOROSENE SODIUM POWD
Status: DC | PRN
  Administered 2012-09-02: 1

## 2012-09-02 MED ORDER — NEOSTIGMINE METHYLSULFATE 1 MG/ML IJ SOLN
INTRAMUSCULAR | Status: DC | PRN
  Administered 2012-09-02: 5 mg via INTRAVENOUS

## 2012-09-02 MED ORDER — SENNOSIDES-DOCUSATE SODIUM 8.6-50 MG PO TABS
1.0000 | ORAL_TABLET | Freq: Every evening | ORAL | Status: DC | PRN
  Filled 2012-09-02: qty 1

## 2012-09-02 MED ORDER — BISACODYL 5 MG PO TBEC
5.0000 mg | DELAYED_RELEASE_TABLET | Freq: Every day | ORAL | Status: DC | PRN
  Administered 2012-09-07: 5 mg via ORAL
  Filled 2012-09-02 (×2): qty 1

## 2012-09-02 MED ORDER — MAGNESIUM CITRATE PO SOLN
1.0000 | Freq: Once | ORAL | Status: AC | PRN
  Filled 2012-09-02: qty 296

## 2012-09-02 MED ORDER — ALBUMIN HUMAN 5 % IV SOLN
INTRAVENOUS | Status: DC | PRN
  Administered 2012-09-02: 15:00:00 via INTRAVENOUS

## 2012-09-02 MED ORDER — ONDANSETRON HCL 4 MG/2ML IJ SOLN
INTRAMUSCULAR | Status: DC | PRN
  Administered 2012-09-02: 4 mg via INTRAVENOUS

## 2012-09-02 MED ORDER — GLYCOPYRROLATE 0.2 MG/ML IJ SOLN
INTRAMUSCULAR | Status: DC | PRN
  Administered 2012-09-02: .6 mg via INTRAVENOUS

## 2012-09-02 MED ORDER — FENTANYL CITRATE 0.05 MG/ML IJ SOLN: 50.0000 ug | Freq: Once | INTRAMUSCULAR | Status: DC

## 2012-09-02 MED ORDER — OXYCODONE HCL 5 MG/5ML PO SOLN: 5.0000 mg | Freq: Once | ORAL | Status: AC | PRN

## 2012-09-02 MED ORDER — PROMETHAZINE HCL 25 MG/ML IJ SOLN
6.2500 mg | INTRAMUSCULAR | Status: DC | PRN
  Administered 2012-09-03: 12.5 mg via INTRAVENOUS
  Filled 2012-09-02 (×2): qty 1

## 2012-09-02 MED ORDER — STERILE WATER FOR IRRIGATION IR SOLN
Status: DC | PRN
  Administered 2012-09-02: 1000 mL

## 2012-09-02 MED ORDER — KCL IN DEXTROSE-NACL 20-5-0.45 MEQ/L-%-% IV SOLN
INTRAVENOUS | Status: DC
  Administered 2012-09-02 – 2012-09-04 (×4): via INTRAVENOUS
  Filled 2012-09-02 (×6): qty 1000

## 2012-09-02 SURGICAL SUPPLY — 84 items
ARTICULEZE HEAD (Hips) ×2 IMPLANT
BNDG COHESIVE 6X5 TAN STRL LF (GAUZE/BANDAGES/DRESSINGS) ×1 IMPLANT
BOWL SMART MIX CTS (DISPOSABLE) IMPLANT
BRUSH FEMORAL CANAL (MISCELLANEOUS) IMPLANT
CLOTH BEACON ORANGE TIMEOUT ST (SAFETY) ×2 IMPLANT
CONT SPECI 4OZ STER CLIK (MISCELLANEOUS) ×1 IMPLANT
COVER SURGICAL LIGHT HANDLE (MISCELLANEOUS) ×2 IMPLANT
DRAPE C-ARM 42X72 X-RAY (DRAPES) IMPLANT
DRAPE ORTHO SPLIT 77X108 STRL (DRAPES) ×4
DRAPE PROXIMA HALF (DRAPES) ×2 IMPLANT
DRAPE SURG ORHT 6 SPLT 77X108 (DRAPES) ×2 IMPLANT
DRAPE U-SHAPE 47X51 STRL (DRAPES) ×2 IMPLANT
DRILL BIT 7/64X5 (BIT) ×2 IMPLANT
DRSG MEPILEX BORDER 4X12 (GAUZE/BANDAGES/DRESSINGS) ×1 IMPLANT
DRSG MEPILEX BORDER 4X8 (GAUZE/BANDAGES/DRESSINGS) ×2 IMPLANT
DRSG PAD ABDOMINAL 8X10 ST (GAUZE/BANDAGES/DRESSINGS) ×2 IMPLANT
DURAPREP 26ML APPLICATOR (WOUND CARE) ×2 IMPLANT
ELECT BLADE 4.0 EZ CLEAN MEGAD (MISCELLANEOUS) ×2
ELECT BLADE 6.5 EXT (BLADE) IMPLANT
ELECT CAUTERY BLADE 6.4 (BLADE) IMPLANT
ELECT REM PT RETURN 9FT ADLT (ELECTROSURGICAL) ×2
ELECTRODE BLDE 4.0 EZ CLN MEGD (MISCELLANEOUS) IMPLANT
ELECTRODE REM PT RTRN 9FT ADLT (ELECTROSURGICAL) ×1 IMPLANT
EVACUATOR 1/8 PVC DRAIN (DRAIN) IMPLANT
EVACUATOR 3/16  PVC DRAIN (DRAIN) ×1
EVACUATOR 3/16 PVC DRAIN (DRAIN) IMPLANT
FLUID NSS /IRRIG 3000 ML XXX (IV SOLUTION) ×1 IMPLANT
GAUZE XEROFORM 5X9 LF (GAUZE/BANDAGES/DRESSINGS) ×2 IMPLANT
GLOVE BIO SURGEON STRL SZ7.5 (GLOVE) ×6 IMPLANT
GLOVE BIO SURGEON STRL SZ8.5 (GLOVE) ×6 IMPLANT
GLOVE BIOGEL M STRL SZ7.5 (GLOVE) ×2 IMPLANT
GLOVE BIOGEL PI IND STRL 8 (GLOVE) ×2 IMPLANT
GLOVE BIOGEL PI IND STRL 9 (GLOVE) ×1 IMPLANT
GLOVE BIOGEL PI INDICATOR 8 (GLOVE) ×2
GLOVE BIOGEL PI INDICATOR 9 (GLOVE) ×1
GOWN PREVENTION PLUS XLARGE (GOWN DISPOSABLE) ×6 IMPLANT
GOWN STRL NON-REIN LRG LVL3 (GOWN DISPOSABLE) ×4 IMPLANT
GOWN STRL REIN XL XLG (GOWN DISPOSABLE) ×4 IMPLANT
HANDPIECE INTERPULSE COAX TIP (DISPOSABLE) ×2
HEAD ARTICULEZE (Hips) IMPLANT
HEEL PROTECTOR  874200 (MISCELLANEOUS)
HEEL PROTECTOR 874200 (MISCELLANEOUS) IMPLANT
HOOD PEEL AWAY FACE SHEILD DIS (HOOD) ×4 IMPLANT
HYDROGEN PEROXIDE 16OZ (MISCELLANEOUS) ×1 IMPLANT
KIT BASIN OR (CUSTOM PROCEDURE TRAY) ×2 IMPLANT
KIT ROOM TURNOVER OR (KITS) ×2 IMPLANT
LINER PINN MAR 4PLUS 10 62 (Hips) ×1 IMPLANT
MANIFOLD NEPTUNE II (INSTRUMENTS) ×2 IMPLANT
NEEDLE 22X1 1/2 (OR ONLY) (NEEDLE) ×2 IMPLANT
NOZZLE PRISM 8.5MM (MISCELLANEOUS) IMPLANT
NS IRRIG 1000ML POUR BTL (IV SOLUTION) ×4 IMPLANT
PACK TOTAL JOINT (CUSTOM PROCEDURE TRAY) ×2 IMPLANT
PAD ARMBOARD 7.5X6 YLW CONV (MISCELLANEOUS) ×4 IMPLANT
PASSER SUT SWANSON 36MM LOOP (INSTRUMENTS) ×1 IMPLANT
PRESSURIZER FEMORAL UNIV (MISCELLANEOUS) IMPLANT
SET HNDPC FAN SPRY TIP SCT (DISPOSABLE) IMPLANT
SLEEVE SURGEON STRL (DRAPES) IMPLANT
SPONGE GAUZE 4X4 12PLY (GAUZE/BANDAGES/DRESSINGS) ×2 IMPLANT
SPONGE LAP 18X18 X RAY DECT (DISPOSABLE) ×4 IMPLANT
STAPLER VISISTAT 35W (STAPLE) ×2 IMPLANT
SUT ETHIBOND 2 V 37 (SUTURE) ×2 IMPLANT
SUT ETHILON 2 0 PSLX (SUTURE) ×1 IMPLANT
SUT ETHILON 3 0 FSL (SUTURE) ×2 IMPLANT
SUT PDS AB 1 CT  36 (SUTURE)
SUT PDS AB 1 CT 36 (SUTURE) IMPLANT
SUT PDS AB 2-0 CT1 27 (SUTURE) IMPLANT
SUT VIC AB 0 CT1 27 (SUTURE) ×2
SUT VIC AB 0 CT1 27XBRD ANBCTR (SUTURE) ×1 IMPLANT
SUT VIC AB 0 CTB1 27 (SUTURE) ×2 IMPLANT
SUT VIC AB 1 CTX 36 (SUTURE) ×2
SUT VIC AB 1 CTX36XBRD ANBCTR (SUTURE) ×1 IMPLANT
SUT VIC AB 2-0 CT1 36 (SUTURE) ×2 IMPLANT
SUT VIC AB 2-0 CTB1 (SUTURE) ×2 IMPLANT
SYR 20ML ECCENTRIC (SYRINGE) ×2 IMPLANT
SYR BULB IRRIGATION 50ML (SYRINGE) ×1 IMPLANT
SYR CONTROL 10ML LL (SYRINGE) ×2 IMPLANT
TAPE CLOTH SURG 4X10 WHT LF (GAUZE/BANDAGES/DRESSINGS) ×1 IMPLANT
TOWEL OR 17X24 6PK STRL BLUE (TOWEL DISPOSABLE) ×2 IMPLANT
TOWEL OR 17X26 10 PK STRL BLUE (TOWEL DISPOSABLE) ×2 IMPLANT
TOWER CARTRIDGE SMART MIX (DISPOSABLE) IMPLANT
TRAY FOLEY CATH 16FRSI W/METER (SET/KITS/TRAYS/PACK) ×1 IMPLANT
TUBE ANAEROBIC SPECIMEN COL (MISCELLANEOUS) ×2 IMPLANT
UNDERPAD 30X30 INCONTINENT (UNDERPADS AND DIAPERS) ×2 IMPLANT
WATER STERILE IRR 1000ML POUR (IV SOLUTION) ×6 IMPLANT

## 2012-09-02 NOTE — Op Note (Signed)
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  Surgeon: Joshua Lewandowsky, MD  Assistant: Tomi Likens. Gaylene Brooks  (present throughout entire procedure and necessary for timely completion of the procedure)  Anesthesia: General  EBL: 500 cc  Fluids: 2 L crystalloid  Indications: Patient presented to my office 9 days ago with right low back pain that radiated down his right leg to his foot. He had spine surgery a few months ago, we contacted his spine surgeon who admitted him for workup of recurrent disc as his hip x-rays were normal his wound is soft and nontender and there is no erythema. On admission to the hospital he had a CT scan that showed retroperitoneal hematoma his INR was 7, and a blood culture was positive for staph aureus, his white count was 23,000. He underwent percutaneous drainage of the retroperitoneal hematoma, on the CT scan there is a question of the fluid tracking down the so as muscle and a CT of the hip was obtained this showed fluid around the right total hip. Attempted aspirate a few days ago came out dry repeat aspirate yesterday yielded purulent material positive for staph aureus. He is taken for radical irrigation and debridement of his thigh and if the infection tracks into the total hip we will do a radical synovectomy and remove the polyethylene liner and femoral head. It is a cemented stem and the patient would like to try a washout with placement of deep drains before all the components are removed. A washout poly-change and deep drains has about a 50-50 chance of working. The organism is methicillin sensitive staph aureus. Interestingly the patient had little pain to foot tap or log roll part to this  procedure.  Procedure: Patient identified by arm band and taken to operating room 10 [place] appropriate anesthetic monitors were attached and general endotracheal anesthesia induced with the patient in the supine position. Foley catheter was inserted he was rolled into the right lateral decubitus position and left lower Charney prepped and draped in usual sterile fashion from the ankle to the hemipelvis. Time out procedure performed and we began the operation by recreating the old total hip incision lateral to posterior lateral 20 cm in length. The skin and subcutaneous tissue had a normal appearance when we cut through the iliotibial band we encountered purulent material posterior to the greater trochanter as well as inflamed abscess around the pseudocapsule the total hip initially the pseudocapsule appeared be intact one small hole was found posteriorly that probably communicated with the hip joint, and based on this we went ahead and took down the pseudocapsule and entered the joint. Pseudocapsule was sent for specimen. The hip joint did not look particularly inflamed and we continued to take pseudocapsule superiorly anteriorly and inferiorly loosening of the joint. The hip was then flexed and internally rotated dislocating the femoral head which was removed with a mallet and a metal cylinder. We remove scar tissue from around the stem and was found to be well placed and well fixed. The trunnion was then tucked superior and anterior to the acetabular shell and scar tissue is remote removed around the shell allowing [place] to remove the polyethylene liner the shell was also well fixed. We did identify the psoas muscle sending  towards the lesser trochanter after removing scar tissue and there was some purulent material noted on the tendon. This was thoroughly debrided. After the sharp dissection the wound was then irrigated with pulse lavage alternating with hydrogen peroxide and Clorpactin solution that was  left in the wound it 1 minute intervals to help strip biofilm off the metal. Satisfied with the debridement, and irrigation we then exchanged gloves and hammered into place a new polyethylene liner index 10 posterior superior and a new +36 mm metal head. Large bore Hemovac drains were placed in the hip joint itself as well as the interval between the iliotibial band and the vastus lateralis. We then closed in layers with running 1-0 Vicryl suture in the IT band, running 0 and 2-0 Vicryl in the subcutaneous tissue and skin staples. A Mepilex dressing was applied the

## 2012-09-02 NOTE — Anesthesia Preprocedure Evaluation (Addendum)
Anesthesia Evaluation  Patient identified by MRN, date of birth, ID band Patient awake    Reviewed: Allergy & Precautions, H&P , NPO status , Patient's Chart, lab work & pertinent test results  History of Anesthesia Complications Negative for: history of anesthetic complications  Airway Mallampati: III TM Distance: >3 FB Neck ROM: Limited    Dental  (+) Edentulous Upper and Dental Advisory Given   Pulmonary neg pulmonary ROS,  breath sounds clear to auscultation  Pulmonary exam normal - decreased breath sounds      Cardiovascular hypertension, Pt. on medications and Pt. on home beta blockers + dysrhythmias Atrial Fibrillation + pacemaker (previous pacemaker, removed 08/31/12) Rhythm:Irregular Rate:Tachycardia  8/14 TEE: EF 55-60%, valves Opacemaker removed for sepsis   Neuro/Psych Anxiety Depression negative neurological ROS     GI/Hepatic negative GI ROS, Neg liver ROS,   Endo/Other  diabetes, Well Controlled, Type 2, Insulin DependentHypothyroidism Morbid obesity  Renal/GU Renal InsufficiencyRenal disease     Musculoskeletal  (+) Arthritis -,   Abdominal (+) + obese,   Peds  Hematology negative hematology ROS (+) Blood dyscrasia (Hb 10.3), anemia ,   Anesthesia Other Findings   Reproductive/Obstetrics                          Anesthesia Physical Anesthesia Plan  ASA: III  Anesthesia Plan: General   Post-op Pain Management:    Induction: Intravenous  Airway Management Planned: Oral ETT  Additional Equipment:   Intra-op Plan:   Post-operative Plan: Extubation in OR  Informed Consent: I have reviewed the patients History and Physical, chart, labs and discussed the procedure including the risks, benefits and alternatives for the proposed anesthesia with the patient or authorized representative who has indicated his/her understanding and acceptance.   Dental advisory given  Plan  Discussed with: Surgeon and CRNA  Anesthesia Plan Comments:        Anesthesia Quick Evaluation

## 2012-09-02 NOTE — Preoperative (Signed)
Beta Blockers   Reason not to administer Beta Blockers:Not Applicable 

## 2012-09-02 NOTE — Progress Notes (Signed)
2 Days Post-Op  Subjective: Rt abd drains placed 8/4 Abscess/hematoma Draining well  Objective: Vital signs in last 24 hours: Temp:  [97.7 F (36.5 C)-98.8 F (37.1 C)] 97.7 F (36.5 C) (08/08 0557) Pulse Rate:  [78-102] 78 (08/08 0557) Resp:  [18-20] 18 (08/08 0557) BP: (120-154)/(51-76) 120/51 mmHg (08/08 0557) SpO2:  [94 %-99 %] 95 % (08/08 0557) Last BM Date: 08/31/12  Intake/Output from previous day: 08/07 0701 - 08/08 0700 In: 40  Out: 965 [Urine:800; Drains:165] Intake/Output this shift:    PE:  Afeb; vss Wbc 12.5 (14.1) Drains intact Output dark blood Cx+ staph Drain #1- 30 cc yesterday           #2- 135 yesterday Sites cklean and dry NT; no bleeding   Lab Results:   Recent Labs  09/01/12 0620 09/02/12 0540  WBC 14.1* 12.5*  HGB 10.6* 10.5*  HCT 31.9* 32.1*  PLT 292 310   BMET  Recent Labs  09/01/12 0620 09/02/12 0540  NA 135 132*  K 3.8 3.8  CL 95* 94*  CO2 31 28  GLUCOSE 100* 86  BUN 21 19  CREATININE 1.16 1.15  CALCIUM 9.0 9.0   PT/INR  Recent Labs  09/01/12 0620 09/02/12 0540  LABPROT 15.3* 15.2  INR 1.24 1.23   ABG No results found for this basename: PHART, PCO2, PO2, HCO3,  in the last 72 hours  Studies/Results: Dg Chest 2 View  09/01/2012   *RADIOLOGY REPORT*  Clinical Data: Interval removal of cardiac pacer apparatus.  CHEST - 2 VIEW  Comparison: Chest radiograph the 02/15/2012  Findings: Stable enlarged cardiac and mediastinal contours with calcification of the transverse thoracic aorta.  No consolidative pulmonary opacities.  No pleural effusion or pneumothorax. Mid thoracic spine degenerative change.  IMPRESSION: No acute cardiopulmonary process.   Original Report Authenticated By: Annia Belt, M.D    Anti-infectives: Anti-infectives   Start     Dose/Rate Route Frequency Ordered Stop   09/01/12 1000  rifampin (RIFADIN) capsule 300 mg     300 mg Oral Every 12 hours 08/31/12 2038     08/31/12 0930  gentamicin  (GARAMYCIN) 80 mg in sodium chloride irrigation 0.9 % 500 mL irrigation  Status:  Discontinued       As needed 08/31/12 0931 08/31/12 1004   08/31/12 0630  gentamicin (GARAMYCIN) 80 mg in sodium chloride irrigation 0.9 % 500 mL irrigation  Status:  Discontinued     80 mg Irrigation On call 08/31/12 0627 08/31/12 1149   08/31/12 0630  ceFAZolin (ANCEF) IVPB 2 g/50 mL premix     2 g 100 mL/hr over 30 Minutes Intravenous On call 08/31/12 0627 08/31/12 0913   08/28/12 1400  ceFAZolin (ANCEF) IVPB 1 g/50 mL premix     1 g 100 mL/hr over 30 Minutes Intravenous 3 times per day 08/28/12 1136     08/28/12 0400  vancomycin (VANCOCIN) 1,500 mg in sodium chloride 0.9 % 500 mL IVPB  Status:  Discontinued     1,500 mg 250 mL/hr over 120 Minutes Intravenous Every 24 hours 08/27/12 0431 08/29/12 1425   08/27/12 0445  vancomycin (VANCOCIN) 2,000 mg in sodium chloride 0.9 % 500 mL IVPB     2,000 mg 250 mL/hr over 120 Minutes Intravenous  Once 08/27/12 0431 08/27/12 0722   08/26/12 1000  cefTRIAXone (ROCEPHIN) 1 g in dextrose 5 % 50 mL IVPB  Status:  Discontinued     1 g 100 mL/hr over 30 Minutes Intravenous Every 24  hours 08/26/12 0913 08/27/12 1610      Assessment/Plan: s/p Procedure(s): ICD LEAD REMOVAL (Left)  RP adb drains placed 8/4 Intact Will follow   LOS: 8 days    [name] A 09/02/2012

## 2012-09-02 NOTE — Progress Notes (Signed)
Patient ID: [name], male   DOB: 08-22-1930, 79 y.o.   MRN: [id number] Subjective:   denies chest pain, no obvious fevers or chills, denies dizziness or shortness of breath  Objective:  Vital Signs in the last 24 hours: Temp:  [97.7 F (36.5 C)-98.8 F (37.1 C)] 97.7 F (36.5 C) (08/08 0557) Pulse Rate:  [78-102] 78 (08/08 0557) Resp:  [18-20] 18 (08/08 0557) BP: (120-154)/(51-76) 120/51 mmHg (08/08 0557) SpO2:  [94 %-99 %] 95 % (08/08 0557)  Intake/Output from previous day: 08/07 0701 - 08/08 0700 In: 40  Out: 965 [Urine:800; Drains:165] Intake/Output from this shift:    Physical Exam: Well appearing NAD HEENT: Unremarkable Neck:  No JVD, no thyromegally Lymphatics:  No adenopathy Back:  No CVA tenderness Lungs:  Clear with no wheezes, rales, or rhonchi. No hematoma at pacemaker removal site HEART:  IRegular rate rhythm, no murmurs, no rubs, no clicks Abd:  soft, positive bowel sounds, no organomegally, no rebound, no guarding Ext:  2 plus pulses, no edema, no cyanosis, no clubbing Skin:  No rashes no nodules Neuro:  CN II through XII intact, motor grossly intact  Lab Results:  Recent Labs  09/01/12 0620 09/02/12 0540  WBC 14.1* 12.5*  HGB 10.6* 10.5*  PLT 292 310    Recent Labs  09/01/12 0620 09/02/12 0540  NA 135 132*  K 3.8 3.8  CL 95* 94*  CO2 31 28  GLUCOSE 100* 86  BUN 21 19  CREATININE 1.16 1.15   No results found for this basename: TROPONINI, CK, MB,  in the last 72 hours Hepatic Function Panel No results found for this basename: PROT, ALBUMIN, AST, ALT, ALKPHOS, BILITOT, BILIDIR, IBILI,  in the last 72 hours No results found for this basename: CHOL,  in the last 72 hours No results found for this basename: PROTIME,  in the last 72 hours  Imaging: Dg Chest 2 View  09/01/2012   *RADIOLOGY REPORT*  Clinical Data: Interval removal of cardiac pacer apparatus.  CHEST - 2 VIEW  Comparison: Chest radiograph the 02/15/2012  Findings: Stable  enlarged cardiac and mediastinal contours with calcification of the transverse thoracic aorta.  No consolidative pulmonary opacities.  No pleural effusion or pneumothorax. Mid thoracic spine degenerative change.  IMPRESSION: No acute cardiopulmonary process.   Original Report Authenticated By: Annia Belt, M.D    Cardiac Studies: Tele - atrial fibrillation with a controlled ventricular response Assessment/Plan:  1. Staph aureus bacteremia 2. Staph aureus retroperitoneal abscess 3. Status post pacemaker insertion, status post pacemaker system extraction secondary to #1. 4. Atrial fibrillation with controlled ventricular response Rec: at this point his ventricular rate in atrial fibrillation appears to be well-controlled. He has no symptomatic bradycardia. Currently no indication for pacemaker insertion or insertion of a temporary permanent pacemaker. Please call us if he develops bradycardia or other issues regarding management of his wound.  LOS: 8 days    Gregg Taylor,M.D. 09/02/2012, 9:55 AM

## 2012-09-02 NOTE — Progress Notes (Signed)
Consent has been obtained and placed in chart

## 2012-09-02 NOTE — Transfer of Care (Signed)
Immediate Anesthesia Transfer of Care Note  Patient: [name]  Procedure(s) Performed: Procedure(s): IRRIGATION AND DEBRIDEMENT RIGHT HIP (Right) REVISION OF POLYETHYLENE LINER AND FEMORAL HEAD  (Right)  Patient Location: PACU  Anesthesia Type:General  Level of Consciousness: awake and patient cooperative  Airway & Oxygen Therapy: Patient Spontanous Breathing and Patient connected to face mask oxygen  Post-op Assessment: Report given to PACU RN and Post -op Vital signs reviewed and stable  Post vital signs: Reviewed and stable  Complications: No apparent anesthesia complications

## 2012-09-02 NOTE — Progress Notes (Signed)
ANTICOAGULATION CONSULT NOTE - Initial Consult  Pharmacy Consult for Coumadin Indication: VTE prophylaxis; Atrial fibrillation  No Known Allergies Patient Measurements: Height: 5' 11.5" (181.6 cm) Weight: 244 lb 1.6 oz (110.723 kg) IBW/kg (Calculated) : 76.45 Vital Signs: Temp: 98 F (36.7 C) (08/08 2050) Temp src: Oral (08/08 1615) BP: 130/64 mmHg (08/08 1700) Pulse Rate: 72 (08/08 1900) Labs:  Recent Labs  08/31/12 0720 09/01/12 0620 09/02/12 0540  HGB  --  10.6* 10.5*  HCT  --  31.9* 32.1*  PLT  --  292 310  LABPROT 19.2* 15.3* 15.2  INR 1.67* 1.24 1.23  CREATININE  --  1.16 1.15   Estimated Creatinine Clearance: 65.4 ml/min (by C-G formula based on Cr of 1.15).  Medical History: Past Medical History  Diagnosis Date  . Atrial fibrillation   . Hypertension   . Pacemaker   . Diabetes mellitus without complication     Borderline  . Arthritis   . Anxiety   . Depression    Medications:  PTA- Coumadin 4mg  po daily. Last dose was 08/25/12. Patient only takes Name Brand Coumadin.   Assessment: 82 YOM on Coumadin for atrial fibrillation and now s/p I & D right hip (hx of R-hip replacement) and s/p pacer lead removal in setting of MSSA bacteremia to resume Coumadin. INR was supra-therapeutic on admission at 7 and reversed with Vitamin K 10mg  po x1 and patient found to have retroperitoneal hematoma and s/p percutaneous drainage for this on 08/29/12.   INR is now down to 1.23.  Patient is on Rifampin which can decrease the INR while on Coumadin.   8/7: Dr. Don Perking notes states discussing with family whether to restart warfarin due to retroperitoneal hematoma but also CHADS-VasC score of 3.   Goal of Therapy:  INR 2-3   Plan:  Called and discussed patient with Dr. Ave Filter. In light of Internal Medicine's progress notes about discussions with family about restarting Coumadin,  Dr. Ave Filter wants to hold off on the restart tonight and follow-up with Internal medicine in AM  to determine appropriate course of action.   -No coumadin tonight per Dr. Ave Filter. Follow-up with team in AM.   Link Snuffer, PharmD, BCPS Clinical Pharmacist [phone] 09/02/2012,9:31 PM

## 2012-09-02 NOTE — Interval H&P Note (Signed)
History and Physical Interval Note:  09/02/2012 1:23 PM  [name]  has presented today for surgery, with the diagnosis of abscess right thigh  The various methods of treatment have been discussed with the patient and family. After consideration of risks, benefits and other options for treatment, the patient has consented to  Procedure(s): IRRIGATION AND DEBRIDEMENT RIGHT HIP (Right) POSSIBLE REVISION OF POLYETHYLENE LINER AND FEMORAL HEAD  (Right) as a surgical intervention .  The patient's history has been reviewed, patient examined, no change in status, stable for surgery.  I have reviewed the patient's chart and labs.  Questions were answered to the patient's satisfaction.     Nestor Lewandowsky

## 2012-09-02 NOTE — OR Nursing (Signed)
2nd unit prbc's from or initiated / unit number [id number] / note written as unable to release this unit as per protocol when received from or

## 2012-09-02 NOTE — OR Nursing (Signed)
Pt c/o being cold/ bair hugger placed  For comfort/warmth despite oral temp 97.6

## 2012-09-02 NOTE — H&P (View-Only) (Signed)
Patient ID: [name], male   DOB: [birth year], 79 y.o.   MRN: [id number] S: Patient feeling depressed because of persistent back pain, and multiple procedures. Reports little if any pain in his right hip. CT scan has been reviewed again and after discussing options with Mr. Buss all reattempt aspiration of the right thigh region going 2 inches distal to the hip joint and just posterior to the femur to obtain a fluid specimen.  O: Once again log roll did not cause any pain to the patient's foot tap was negative firing the abductors cause no discomfort as well he still has weakness to flexion consistent with a psoas abscess. Patient was placed in the left lateral decubitus position at the bedside and the lateral aspect of the right thigh was sterilely prepped with alcohol and anesthetized with ethyl chloride. 2.5 inches distal to the tip of the greater trochanter and just posterior to the femur an 18-gauge needle was advanced through the skin and subcutaneous tissue and through the IT band. 10 cc of bloody turbid purulent fluid were obtained without difficulty. The needle was withdrawn there was no active bleeding and a Band-Aid was applied. Fluid was sent for Gram stain and cultures  A: At a minimum he has an abscess below the iliotibial band of the nail track down along the so as tendon. I am still not sure if the infection is ashy made into the hip joint as the attempted aspirate on Monday was basically dry.  P:  I have scheduled him for a formal irrigation and debridement of the right total hip wound.If the hip capsule has been violated at surgery we will include a radical irrigation and debridement of the hip with a polyethylene liner exchange and a new femoral head. If the capsule is not dilated we will simply place a deep drains and the space between the iliotibial band and the vastus lateralis where the fluid collection was best seen on CT scan. Removal of all implants was also discussed, the  patient does not walled all implants removed at this time.

## 2012-09-02 NOTE — Progress Notes (Signed)
[facility] PROGRESS NOTE  [name] ZOX:[id number] DOB: 06/13/1930 DOA: 08/25/2012 PCP: Malka So., MD  Assessment/Plan: #1 MSSA bacteremia  -rifamipin started 09/01/12   -infected retroperitoneal fluid collection-->MSSA  -TEE--no vegetations  -08/31/12 PPM removed--appreciate Dr. Ladona Ridgel  -concerned about right prosthetic hip--09/01/12 aspirate of iliotibial band with GPC (clusters+chains)  -patient states he is agreeable to hip explantation if necessary -agree with chronic abx suppression if hip not explanted  - 8/3 blood cultures negative  #2 dysuria -recheck UA with reflex to culture #3 Infected retroperitoneal hematoma and infected R-hip collection  -CT of the hip and CT of the abdomen and pelvis worrisome for complex fluid collection around the right hip and also retroperitoneal hematoma. Surgery signed off -remain concern about infected hipprosthesis -appreciate orthopedic follow up--for I&D and exploration 09/02/12 -Interventional radiology s/p aspiration and drainage or retroperitoneal hematoma 08/29/12.  -09/01/12 IR aspiration of IT band #4 supratherapeutic INR/Afib  -INR 7.19 on day of admission -Status post vitamin K doses x2 and FFP -remain off anticoagulation for nowin anticipation for surgery on 09/02/2012 -CHADS-VasC= 3-->in light of the patient's retroperitoneal hematoma,I have discussed the risks, benefits, and alternatives of ultimately restarting anticoagulation with warfarin versus aspirin when the patient is stable -family to discuss #5 right lower extremity weakness and back pain  Likely secondary to infected retroperitoneal hematoma and possible complex fluid collection noted around the right hip   #6 acute renal failure/dehydration/hypokalemia  -improving  -Likely combination of hypovolemia and ATN  -Patient also noted to be in Zaroxolyn which is the likely etiology of this is hypokalemia. Replete potassium. Continue daily potassium.    #7  leukocytosis  -Improving on abx  #8 right-sided abdominal tenderness  Clinical improvement. Likely secondary to retroperitoneal hematoma. Abdominal ultrasound and abdominal films negative for any acute abnormalities. Abdominal ultrasound positive for hepatic steatosis. Follow. Pain management. Supportive care.  #9 atrial fibrillation  Continue beta blocker for rate control. INR has been reversed  -Patient is status post permanent pacemaker.  #10 normocytic anemia  Stable. Follow H&H.  #11 type 2 diabetes  Continue sliding scale insulin.  Family Communication: Wife and daughter at beside  Disposition Plan: SNF when medically stable  Antibiotics:  IV vancomycin 08/26/2012--->08/29/12  IV Rocephin 08/26/2012---. 08/27/12  IV Ancef 08/28/2012-->  Rifampin 09/01/12-->   Family Communication:   Wife and daughter at beside Disposition Plan:   SNF when medically stable      Procedures/Studies: Ct Abdomen Pelvis Wo Contrast  08/28/2012   *RADIOLOGY REPORT*  Clinical Data: Evaluate abscess.  CT ABDOMEN AND PELVIS WITHOUT CONTRAST  Technique:  Multidetector CT imaging of the abdomen and pelvis was performed following the standard protocol without intravenous contrast.  Comparison: CT of the right hip from the same date.  Findings:  BODY WALL: Unremarkable.  LOWER CHEST:  Mediastinum: Single chamber right ventricular pacer.  Coronary artery atherosclerosis.  Lungs/pleura: Small bilateral pleural effusions and associated atelectasis.  ABDOMEN/PELVIS:  Liver: No focal abnormality.  Biliary: Layering stones.  No evidence of acute cholecystitis.  Pancreas: Fatty atrophy.  Spleen: Unremarkable.  Adrenals: Unremarkable.  Kidneys and ureters: There are two exophytic right renal lesions in the interpolar region.  The largest measures 2.6 cm and is compatible with cyst - confirmed by recent sonography.  The second measures 15 mm and is soft tissue density. This was not definitely seen on prior ultrasound. This  lesion may have grown mildly since imaging [id number].  No hydronephrosis or stone.  Bladder: Mild distension by urine.  Bowel:  No definite communication between the bowel and retroperitoneal collection.  Unfortunately, enteric contrast has not yet reached the colon at time of imaging.   Normal appendix.  Retroperitoneum:Extensive right retroperitoneal hematoma gas and fluid collection, involving the lower posterior pararenal space, the right iliacus muscle, proximal right rectus femoris, iliopsoas bursa.  The largest posterior pararenal component is at the pelvic brim, measuring 9 x 4 by 6 cm.  The psoas muscle is spared.  No evidence of epidural extension of infection.  The neighboring osseous structures show no evidence of osteomyelitis.  Evaluation of the total right hip prosthesis was better accomplished on dedicated CT imaging. There is a relatively large collection posterior to the right femoral neck and greater trochanter, containing fluid and gas, measuring 7 x 3 x 3 cm. High density material present within the expanded right iliacus, measuring approximately 5 cm in diameter.  Peritoneum: No free fluid or gas.  Reproductive: Prostate enlargement calcifications.  Vascular: Extensive atherosclerotic calcification.  OSSEOUS: Total right hip prosthesis with surrounding fluid collections, as above.  Extensive degenerative disc disease with multilevel fusion and laminectomy.  No evidence of disc space infection. No suspicious lytic or blastic lesions.  IMPRESSION:  1.  Extensive complex fluid collection, likely abscess, involving the lower right retroperitoneum and right periarticular hip - size and extent described above. Although there is extensive gas within the collection, no bowel communication identified.  Probable hematoma within the iliacus component.  2. Indeterminate, possibly solid 15 mm lesion in the interpolar right kidney.  Followup imaging advised.  3. Cholelithiasis.   Original Report  Authenticated By: Tiburcio Pea   Dg Chest 2 View  09/01/2012   *RADIOLOGY REPORT*  Clinical Data: Interval removal of cardiac pacer apparatus.  CHEST - 2 VIEW  Comparison: Chest radiograph the 02/15/2012  Findings: Stable enlarged cardiac and mediastinal contours with calcification of the transverse thoracic aorta.  No consolidative pulmonary opacities.  No pleural effusion or pneumothorax. Mid thoracic spine degenerative change.  IMPRESSION: No acute cardiopulmonary process.   Original Report Authenticated By: Annia Belt, M.D   Dg Hip Complete Right  08/28/2012   *RADIOLOGY REPORT*  Clinical Data: 79 year old male with right hip pain.  RIGHT HIP - COMPLETE 2+ VIEW  Comparison: 11/26/2010 radiographs  Findings: Right total hip replacement again identified. There is no evidence of fracture, subluxation or dislocation. No complicating features are identified.  IMPRESSION: Right total hip replacement without acute abnormality.   Original Report Authenticated By: Harmon Pier, M.D.   Ct Lumbar Spine Wo Contrast  08/27/2012   *RADIOLOGY REPORT*  Clinical Data: Low back/right leg pain, fever, positive blood cultures  CT LUMBAR SPINE WITHOUT CONTRAST  Technique:  Multidetector CT imaging of the lumbar spine was performed without intravenous contrast administration. Multiplanar CT image reconstructions were also generated.  Comparison: CT myelogram dated 02/10/2012.  Findings: No evidence of fracture or dislocation.  Vertebral body heights are maintained.  Postsurgical changes related to prior T12-L1 posterior laminectomy/discectomy and prior L4-S1 posterior laminectomy.  Prior L3-4 fusion.  Moderate to severe multilevel degenerative changes, unchanged from prior CT myelogram.  This includes mild to moderate central canal stenosis of the upper lumbar spine and moderate to severe central canal stenosis of the lower lumbar spine with nerve root encroachment at multiple levels.  No evidence of epidural fluid  collection/abscess.  Complex fluid collection in the right abdomen/pelvis, incompletely visualized.  IMPRESSION: Postsurgical changes involving the lower thoracolumbar spine.  Stable moderate to severe multilevel degenerative changes, unchanged from prior  CT myelogram.  No evidence of epidural fluid collection/abscess.  Complex fluid collection in the right abdomen/pelvis, incompletely visualized.  Please refer to CT right hip report for further assessment.   Original Report Authenticated By: Charline Bills, M.D.   US Abdomen Complete  08/26/2012   *RADIOLOGY REPORT*  Clinical Data:  79 year old with abnormal liver function tests.  COMPLETE ABDOMINAL ULTRASOUND  Comparison:  Unenhanced CT abdomen and pelvis 12020/03/3111 Section.  Abdominal ultrasound 05/21/2011 [facility].  Findings:  Gallbladder:  Small shadowing gallstones.  Patient unable to turn into the lateral decubitus position to confirm movement of the stones, but these were present on the prior ultrasound examination. No gallbladder wall thickening or pericholecystic fluid.  Negative sonographic Murphy's sign according to the ultrasound technologist.  Common bile duct:  Normal in caliber with maximum diameter approximating 3 mm.  Liver:  Diffusely increased and coarsened echotexture without focal parenchymal abnormality.  Patent portal vein with hepatopetal flow.  IVC:  Patent in its intrahepatic portion.  Obscured outside the liver by bowel gas.  Pancreas:  Obscured by midline bowel gas and therefore not evaluated.  Spleen:  Normal size and echotexture without focal parenchymal abnormality.  Right Kidney:  No hydronephrosis.  Well-preserved cortex for age. Cysts arising from the mid and lower pole as noted previously, the cyst arising from the mid kidney approximating 2.8 cm and the cyst arising from the lower pole approximating 3.1 cm.  No solid renal masses.  Normal parenchymal echotexture.  Approximately 12.0 cm in length.  Left Kidney:   No hydronephrosis.  Well-preserved cortex for age. Normal parenchymal echotexture.  No focal parenchymal abnormality. Approximately 10.7 cm in length.  Abdominal aorta:  Obscured by midline bowel gas and therefore not evaluated.  IMPRESSION:  1.  Cholelithiasis.  No sonographic evidence of acute cholecystitis.  No biliary ductal dilation. 2.  Diffuse hepatic steatosis without focal hepatic parenchymal abnormality. 3.  Midline structures (pancreas, extrahepatic IVC, and abdominal aorta) obscured by overlying bowel gas and therefore not evaluated.   Original Report Authenticated By: Hulan Saas, M.D.   Ct Hip Right Wo Contrast  08/27/2012   *RADIOLOGY REPORT*  Clinical Data: Low back/right leg pain, fever, positive blood cultures  CT OF THE RIGHT HIP WITHOUT CONTRAST  Technique:  Multidetector CT imaging was performed according to the standard protocol. Multiplanar CT image reconstructions were also generated.  Comparison: CT abdomen pelvis dated 12020/03/3111.  Findings: Right total hip arthroplasty.  No evidence of fracture or dislocation.  Complex fluid collection/debris involving the right hip (series 5/image 36), right iliopsoas/iliacus muscles (series 5/images 9 and 23), and the visualized right lower retroperitoneum (series 5/image 1).  Representative axial measurements include: --3.8 x 8.3 cm in the right retroperitoneum (series 5/image 5) --9.2 x 5.6 cm in the right iliacus muscle (series 5/image 13) --12.7 x 7.3 cm along the right hip (series 5/image 37)  Based on this study, it is unclear whether this reflects a right hip infection which secondarily involves the lower abdomen/pelvis or whether this originated in the lower abdomen/pelvis and now extends to the right hip.  IMPRESSION: Right total hip arthroplasty.  Complex fluid collection/debris involving the right hip, right iliopsoas/iliacus muscles, and visualized right lower retroperitoneum, as described above.  Based on this study, it is unclear  whether this reflects a right hip infection which secondarily involves the lower abdomen/pelvis (favored) or whether this originated in the lower abdomen/pelvis and now extends to the right hip.  Consider CT abdomen/pelvis (preferably with half  dose contrast) for further evaluation.  These results were called by telephone on 08/27/2012 at 2000 hrs to Dr. Tressie Stalker, who verbally acknowledged these results.   Original Report Authenticated By: Charline Bills, M.D.   Dg Chest Port 1 View  08/26/2012   *RADIOLOGY REPORT*  Clinical Data: Elevated white blood count  PORTABLE CHEST - 1 VIEW  Comparison: November 11, 2010.  Findings: Stable cardiomediastinal silhouette.  Left-sided pacemaker is unchanged in position.  No acute pulmonary disease is noted.  No pneumothorax or pleural effusion is noted.  Degenerative joint disease is seen involving both shoulders.  IMPRESSION: No acute cardiopulmonary abnormality seen.   Original Report Authenticated By: Lupita Raider.,  M.D.   Dg Abd Portable 2v  08/26/2012   *RADIOLOGY REPORT*  Clinical Data: Abdominal pain.  PORTABLE ABDOMEN - 2 VIEW  Comparison: [facility] images for CT scan of the lumbar spine dated 02/10/2012  Findings: There is no free air in the abdomen.  There is air scattered throughout nondistended loops of large and small bowel. No excessive stool in the colon.  Diffuse degenerative changes of the lumbar spine.  Right hip prosthesis.  IMPRESSION: No acute abnormalities.   Original Report Authenticated By: Francene Boyers, M.D.   Ct Image Guided Fluid Drain By Catheter  08/29/2012   *RADIOLOGY REPORT*  Clinical Data: Right-sided retroperitoneal fluid collections containing air.  CT GUIDED DRAINAGE OF RETROPERITONEAL ABSCESS x 2  Sedation:  2.0 mg IV Versed;  50 mcg IV Fentanyl  Total Moderate Sedation Time: 36 minutes.  Procedure:  The procedure, risks, benefits, and alternatives were explained to the patient.  Questions regarding the procedure were encouraged  and answered. The patient understands and consents to the procedure.  The right lateral abdominal wall was prepped with Betadine in a sterile fashion, and a sterile drape was applied covering the operative field.  A sterile gown and sterile gloves were used for the procedure. Local anesthesia was provided with 1% Lidocaine.  CT was performed in a supine position with the right side rolled up slightly.  Under CT guidance, two separate 18 gauge trocar needles were advanced into both anterior and posterior retroperitoneal fluid collections on the right.  Fluid aspiration was performed and a fluid sample sent for culture analysis.  Guide wires were advanced into the two dominant pockets.  Tracts were dilated and 12-French percutaneous drainage catheters placed. Catheter position was confirmed by CT.  The catheters were flushed with saline and connected to suction bulbs.  Both catheters were secured at the skin with Prolene retention sutures and adhesive Stat-Lock devices.  Complications: None  Findings: It did appear that the dominant retroperitoneal fluid collections do not appear to show good communication with two dominant pockets identified.  Aspiration at the level of the anterior dominant pocket revealed liquefied blood which was not grossly turbid.  Aspiration of the dominant posterior retroperitoneal collection in the iliac fossa yielded dark partially liquefied blood.  12-French drains were placed in both collections.  The more anterior collection shows much more significant initial return of liquefied blood.  The posterior collection is slowly draining after flushing the drainage catheter.  IMPRESSION: Both anterior and posterior dominant right retroperitoneal collections were aspirated and treated with placement of indwelling 12-French drains.  The more anterior collection demonstrated free return of liquefied blood.  The more posterior collection yielded much darker old blood product with slow return.  Both  drains were connected to suction bulbs and will be flushed every 8 hours.  Original Report Authenticated By: Irish Lack, M.D.   Ct Image Guided Fluid Drain By Catheter  08/29/2012   *RADIOLOGY REPORT*  Clinical Data: Right-sided retroperitoneal fluid collections containing air.  CT GUIDED DRAINAGE OF RETROPERITONEAL ABSCESS x 2  Sedation:  2.0 mg IV Versed;  50 mcg IV Fentanyl  Total Moderate Sedation Time: 36 minutes.  Procedure:  The procedure, risks, benefits, and alternatives were explained to the patient.  Questions regarding the procedure were encouraged and answered. The patient understands and consents to the procedure.  The right lateral abdominal wall was prepped with Betadine in a sterile fashion, and a sterile drape was applied covering the operative field.  A sterile gown and sterile gloves were used for the procedure. Local anesthesia was provided with 1% Lidocaine.  CT was performed in a supine position with the right side rolled up slightly.  Under CT guidance, two separate 18 gauge trocar needles were advanced into both anterior and posterior retroperitoneal fluid collections on the right.  Fluid aspiration was performed and a fluid sample sent for culture analysis.  Guide wires were advanced into the two dominant pockets.  Tracts were dilated and 12-French percutaneous drainage catheters placed. Catheter position was confirmed by CT.  The catheters were flushed with saline and connected to suction bulbs.  Both catheters were secured at the skin with Prolene retention sutures and adhesive Stat-Lock devices.  Complications: None  Findings: It did appear that the dominant retroperitoneal fluid collections do not appear to show good communication with two dominant pockets identified.  Aspiration at the level of the anterior dominant pocket revealed liquefied blood which was not grossly turbid.  Aspiration of the dominant posterior retroperitoneal collection in the iliac fossa yielded dark  partially liquefied blood.  12-French drains were placed in both collections.  The more anterior collection shows much more significant initial return of liquefied blood.  The posterior collection is slowly draining after flushing the drainage catheter.  IMPRESSION: Both anterior and posterior dominant right retroperitoneal collections were aspirated and treated with placement of indwelling 12-French drains.  The more anterior collection demonstrated free return of liquefied blood.  The more posterior collection yielded much darker old blood product with slow return.  Both drains were connected to suction bulbs and will be flushed every 8 hours.   Original Report Authenticated By: Irish Lack, M.D.         Subjective: Patient complains of some dysuria. Denies fevers, chills, chest pain, shortness breath, nausea, vomiting, diarrhea, , headache, dizziness. He has some suprapubic and right lower quadrant abdominal pain.  Objective: Filed Vitals:   09/01/12 1835 09/01/12 2121 09/02/12 0136 09/02/12 0557  BP: 140/73 136/76 154/69 120/51  Pulse: 81 102 102 78  Temp: 98.4 F (36.9 C) 98.5 F (36.9 C) 98.1 F (36.7 C) 97.7 F (36.5 C)  TempSrc: Oral Oral Oral Oral  Resp: 20 18 18 18   Height:      Weight:      SpO2: 99% 97% 97% 95%    Intake/Output Summary (Last 24 hours) at 09/02/12 1031 Last data filed at 09/02/12 4098  Gross per 24 hour  Intake     40 ml  Output    965 ml  Net   -925 ml   Weight change:  Exam:   General:  Pt is alert, follows commands appropriately, not in acute distress  HEENT: No icterus, No thrush,  [place]/AT  Cardiovascular: RRR, S1/S2, no rubs, no gallops  Respiratory: CTA bilaterally, no wheezing, no  crackles, no rhonchi  Abdomen: Soft/+BS, suprapubic and RLQ tender, non distended, no guarding  Extremities: No edema, No lymphangitis, No petechiae, No rashes, no synovitis  Data Reviewed: Basic Metabolic Panel:  Recent Labs Lab 08/26/12 1120   08/28/12 0035 08/29/12 0540 08/30/12 0600 09/01/12 0620 09/02/12 0540  NA 134*  < > 134* 137 136 135 132*  K 2.7*  < > 3.0* 3.6 3.7 3.8 3.8  CL 91*  < > 93* 93* 94* 95* 94*  CO2 33*  < > 33* 34* 32 31 28  GLUCOSE 139*  < > 160* 141* 140* 100* 86  BUN 71*  < > 45* 31* 28* 21 19  CREATININE 2.51*  < > 1.51* 1.43* 1.28 1.16 1.15  CALCIUM 8.6  < > 8.5 8.7 8.8 9.0 9.0  MG 2.7*  --   --   --   --   --   --   < > = values in this interval not displayed. Liver Function Tests:  Recent Labs Lab 08/26/12 1120 08/27/12 0530 08/28/12 0035  AST 192* 117* 82*  ALT 78* 60* 49  ALKPHOS 89 97 94  BILITOT 0.7 1.2 1.3*  PROT 6.3 6.4 6.3  ALBUMIN 1.8* 2.3* 1.9*   No results found for this basename: LIPASE, AMYLASE,  in the last 168 hours No results found for this basename: AMMONIA,  in the last 168 hours CBC:  Recent Labs Lab 08/27/12 0530 08/28/12 0035 08/29/12 0540 08/30/12 0600 09/01/12 0620 09/02/12 0540  WBC 17.0* 14.4* 15.4* 13.2* 14.1* 12.5*  NEUTROABS 15.3*  --  13.4* 11.2*  --   --   HGB 9.9* 9.6* 10.2* 10.3* 10.6* 10.5*  HCT 28.7* 29.5* 30.9* 31.3* 31.9* 32.1*  MCV 88.3 89.7 90.4 89.9 89.9 89.7  PLT 218 224 246 252 292 310   Cardiac Enzymes: No results found for this basename: CKTOTAL, CKMB, CKMBINDEX, TROPONINI,  in the last 168 hours BNP: No components found with this basename: POCBNP,  CBG:  Recent Labs Lab 09/01/12 0709 09/01/12 1228 09/01/12 1708 09/01/12 2119 09/02/12 0707  GLUCAP 102* 141* 112* 101* 86    Recent Results (from the past 240 hour(s))  URINE CULTURE     Status: None   Collection Time    08/26/12  7:32 AM      Result Value Range Status   Specimen Description URINE, CLEAN CATCH   Final   Special Requests NONE   Final   Culture  Setup Time 08/26/2012 21:40   Final   Colony Count >=100,000 COLONIES/ML   Final   Culture KLEBSIELLA OZAENAE   Final   Report Status 08/29/2012 FINAL   Final   Organism ID, Bacteria KLEBSIELLA OZAENAE   Final   CULTURE, BLOOD (ROUTINE X 2)     Status: None   Collection Time    08/26/12 11:20 AM      Result Value Range Status   Specimen Description BLOOD LEFT ARM   Final   Special Requests BOTTLES DRAWN AEROBIC AND ANAEROBIC 10CC   Final   Culture  Setup Time 08/26/2012 16:54   Final   Culture     Final   Value: STAPHYLOCOCCUS AUREUS     Note: SUSCEPTIBILITIES PERFORMED ON PREVIOUS CULTURE WITHIN THE LAST 5 DAYS.     2 Note: Gram Stain Report Called to,Read Back By and Verified With: KELSEY [place] RN ON8 14 @420  EDMOJ   Report Status 08/29/2012 FINAL   Final  CULTURE, BLOOD (ROUTINE X 2)  Status: None   Collection Time    08/26/12 11:30 AM      Result Value Range Status   Specimen Description BLOOD LEFT HAND   Final   Special Requests     Final   Value: BOTTLES DRAWN AEROBIC AND ANAEROBIC AERO 10CC ANA 5CC   Culture  Setup Time 08/26/2012 16:54   Final   Culture     Final   Value: STAPHYLOCOCCUS AUREUS     Note: RIFAMPIN AND GENTAMICIN SHOULD NOT BE USED AS SINGLE DRUGS FOR TREATMENT OF STAPH INFECTIONS.     2 Note: Gram Stain Report Called to,Read Back By and Verified With: KELSEY [place] RN ON8 14 @420  EDMOJ   Report Status 08/29/2012 FINAL   Final   Organism ID, Bacteria STAPHYLOCOCCUS AUREUS   Final  CULTURE, BLOOD (ROUTINE X 2)     Status: None   Collection Time    08/28/12 12:45 AM      Result Value Range Status   Specimen Description BLOOD RIGHT HAND   Final   Special Requests BOTTLES DRAWN AEROBIC AND ANAEROBIC 10CC   Final   Culture  Setup Time 08/28/2012 15:05   Final   Culture     Final   Value:        BLOOD CULTURE RECEIVED NO GROWTH TO DATE CULTURE WILL BE HELD FOR 5 DAYS BEFORE ISSUING A FINAL NEGATIVE REPORT   Report Status PENDING   Incomplete  CULTURE, BLOOD (ROUTINE X 2)     Status: None   Collection Time    08/28/12 12:55 AM      Result Value Range Status   Specimen Description BLOOD RIGHT HAND   Final   Special Requests BOTTLES DRAWN AEROBIC ONLY 10CC    Final   Culture  Setup Time 08/28/2012 15:05   Final   Culture     Final   Value:        BLOOD CULTURE RECEIVED NO GROWTH TO DATE CULTURE WILL BE HELD FOR 5 DAYS BEFORE ISSUING A FINAL NEGATIVE REPORT   Report Status PENDING   Incomplete  CULTURE, ROUTINE-ABSCESS     Status: None   Collection Time    08/29/12  3:01 PM      Result Value Range Status   Specimen Description ABSCESS   Final   Special Requests RIGHT RETROPERITONEAL HEMATOMA   Final   Gram Stain     Final   Value: MODERATE WBC PRESENT, PREDOMINANTLY PMN     NO SQUAMOUS EPITHELIAL CELLS SEEN     MODERATE GRAM POSITIVE COCCI IN CLUSTERS     Performed at [facility]   Culture     Final   Value: ABUNDANT STAPHYLOCOCCUS AUREUS     Note: RIFAMPIN AND GENTAMICIN SHOULD NOT BE USED AS SINGLE DRUGS FOR TREATMENT OF STAPH INFECTIONS.     Performed at [facility]   Report Status 09/01/2012 FINAL   Final   Organism ID, Bacteria STAPHYLOCOCCUS AUREUS   Final  ANAEROBIC CULTURE     Status: None   Collection Time    08/29/12  3:01 PM      Result Value Range Status   Specimen Description ABSCESS   Final   Special Requests RIGHT RETROPERITONEAL HEMATOMA   Final   Gram Stain     Final   Value: MODERATE WBC PRESENT, PREDOMINANTLY PMN     NO SQUAMOUS EPITHELIAL CELLS SEEN     M0 GRAM POSITIVE COCCI     IN  PAIRS INCISION     Performed at [facility]   Culture     Final   Value: NO ANAEROBES ISOLATED; CULTURE IN PROGRESS FOR 5 DAYS     Performed at [facility]   Report Status PENDING   Incomplete  SURGICAL PCR SCREEN     Status: None   Collection Time    08/31/12  7:00 AM      Result Value Range Status   MRSA, PCR NEGATIVE  NEGATIVE Final   Staphylococcus aureus NEGATIVE  NEGATIVE Final   Comment:            The Xpert SA Assay (FDA     approved for NASAL specimens     in patients over 66 years of age),     is one component of     a comprehensive surveillance     program.  Test performance has      been validated by [employer] for patients greater     than or equal to 45 year old.     It is not intended     to diagnose infection nor to     guide or monitor treatment.  CULTURE, BLOOD (ROUTINE X 2)     Status: None   Collection Time    09/01/12  6:30 AM      Result Value Range Status   Specimen Description BLOOD RIGHT HAND   Final   Special Requests     Final   Value: BOTTLES DRAWN AEROBIC AND ANAEROBIC 10CC AEROBIC 6CC ANAEROBIC   Culture  Setup Time     Final   Value: 09/01/2012 11:05     Performed at [facility]   Culture     Final   Value:        BLOOD CULTURE RECEIVED NO GROWTH TO DATE CULTURE WILL BE HELD FOR 5 DAYS BEFORE ISSUING A FINAL NEGATIVE REPORT     Performed at [facility]   Report Status PENDING   Incomplete  CULTURE, BLOOD (ROUTINE X 2)     Status: None   Collection Time    09/01/12  6:35 AM      Result Value Range Status   Specimen Description BLOOD LEFT HAND   Final   Special Requests BOTTLES DRAWN AEROBIC ONLY 10CC   Final   Culture  Setup Time     Final   Value: 09/01/2012 11:05     Performed at [facility]   Culture     Final   Value:        BLOOD CULTURE RECEIVED NO GROWTH TO DATE CULTURE WILL BE HELD FOR 5 DAYS BEFORE ISSUING A FINAL NEGATIVE REPORT     Performed at [facility]   Report Status PENDING   Incomplete  ANAEROBIC CULTURE     Status: None   Collection Time    09/01/12  9:08 AM      Result Value Range Status   Specimen Description ABSCESS HIP RIGHT   Final   Special Requests RIGHT HIP MUSCULAR ABSCESS   Final   Gram Stain     Final   Value: ABUNDANT WBC PRESENT,BOTH PMN AND MONONUCLEAR     MODERATE GRAM POSITIVE COCCI     IN PAIRS IN CLUSTERS Performed at [facility]     Performed at [facility]   Culture PENDING   Incomplete   Report Status PENDING   Incomplete  GRAM STAIN     Status: None   Collection Time    09/01/12  9:08 AM      Result Value Range Status    Specimen Description ABSCESS HIP RIGHT   Final   Special Requests RIGHT HIP MUSCULAR ABSCESS   Final   Gram Stain     Final   Value: ABUNDANT WBC PRESENT,BOTH PMN AND MONONUCLEAR     MODERATE GRAM POSITIVE COCCI IN PAIRS IN CLUSTERS     CALLED TO A RODRIGUEZ,RN 09/01/12 0955 BY K SCHULTZ   Report Status 09/01/2012 FINAL   Final  CULTURE, ROUTINE-ABSCESS     Status: None   Collection Time    09/01/12  9:08 AM      Result Value Range Status   Specimen Description ABSCESS HIP RIGHT   Final   Special Requests RIGHT HIP MUSCULAR ABSCESS   Final   Gram Stain     Final   Value: ABUNDANT WBC PRESENT,BOTH PMN AND MONONUCLEAR     MODERATE GRAM POSITIVE COCCI     IN PAIRS IN CLUSTERS Performed at [facility]     Performed at [facility]   Culture     Final   Value: NO GROWTH 1 DAY     Performed at [facility]   Report Status PENDING   Incomplete     Scheduled Meds: .  ceFAZolin (ANCEF) IV  1 g Intravenous Q8H  . finasteride  5 mg Oral Daily  . insulin aspart  0-9 Units Subcutaneous TID WC  . levothyroxine  75 mcg Oral QAC breakfast  . metolazone  2.5 mg Oral Q M,W,F  . metoprolol succinate  12.5 mg Oral Daily  . potassium chloride  40 mEq Oral Daily  . rifampin  300 mg Oral Q12H   Continuous Infusions:    Joshua Brandenburger, DO  [facility] Pager [phone]  If 7PM-7AM, please contact night-coverage [email or web address] Password [facility] 09/02/2012, 10:31 AM   LOS: 8 days

## 2012-09-02 NOTE — OR Nursing (Signed)
Alert, oriented and at eas in pacu. Have offered him dinner/refused, and any beverage of his chooosing/refused same. Reminds me  When I ask if he needs anything, "I'm fine" and also denies any discomfort.

## 2012-09-03 DIAGNOSIS — E119 Type 2 diabetes mellitus without complications: Secondary | ICD-10-CM

## 2012-09-03 DIAGNOSIS — K651 Peritoneal abscess: Secondary | ICD-10-CM

## 2012-09-03 LAB — TYPE AND SCREEN
ABO/RH(D): A POS
Antibody Screen: NEGATIVE
Unit division: 0
Unit division: 0
Unit division: 0
Unit division: 0

## 2012-09-03 LAB — URINALYSIS W MICROSCOPIC + REFLEX CULTURE
Ketones, ur: 15 mg/dL — AB
Nitrite: POSITIVE — AB
pH: 5.5 (ref 5.0–8.0)

## 2012-09-03 LAB — BASIC METABOLIC PANEL
Calcium: 8.5 mg/dL (ref 8.4–10.5)
Chloride: 97 mEq/L (ref 96–112)
Creatinine, Ser: 1.41 mg/dL — ABNORMAL HIGH (ref 0.50–1.35)
GFR calc Af Amer: 53 mL/min — ABNORMAL LOW (ref >90)
Sodium: 132 mEq/L — ABNORMAL LOW (ref 135–145)

## 2012-09-03 LAB — CULTURE, BLOOD (ROUTINE X 2): Culture: NO GROWTH

## 2012-09-03 LAB — ANAEROBIC CULTURE

## 2012-09-03 LAB — GLUCOSE, CAPILLARY
Glucose-Capillary: 96 mg/dL (ref 70–99)
Glucose-Capillary: 152 mg/dL — ABNORMAL HIGH (ref 70–99)
Glucose-Capillary: 164 mg/dL — ABNORMAL HIGH (ref 70–99)

## 2012-09-03 LAB — CBC
MCV: 88.1 fL (ref 78.0–100.0)
Platelets: 303 10*3/uL (ref 150–400)
RBC: 3.6 MIL/uL — ABNORMAL LOW (ref 4.22–5.81)
RDW: 16.5 % — ABNORMAL HIGH (ref 11.5–15.5)
WBC: 15.6 10*3/uL — ABNORMAL HIGH (ref 4.0–10.5)

## 2012-09-03 LAB — PROTIME-INR: Prothrombin Time: 16.1 seconds — ABNORMAL HIGH (ref 11.6–15.2)

## 2012-09-03 MED ORDER — ASPIRIN 81 MG PO CHEW: 81.0000 mg | CHEWABLE_TABLET | Freq: Every day | ORAL | Status: DC

## 2012-09-03 MED ORDER — DILTIAZEM HCL 30 MG PO TABS
30.0000 mg | ORAL_TABLET | Freq: Four times a day (QID) | ORAL | Status: AC
  Administered 2012-09-03 – 2012-09-04 (×5): 30 mg via ORAL
  Filled 2012-09-03 (×7): qty 1

## 2012-09-03 NOTE — Progress Notes (Signed)
[facility] PROGRESS NOTE  [name] ZOX:[id number] DOB: 24-May-1930 DOA: 08/25/2012 PCP: Malka So., MD  Assessment/Plan: #1 MSSA bacteremia  -rifamipin started 09/01/12  -infected retroperitoneal fluid collection-->MSSA  -TEE--no vegetations  -08/31/12 PPM removed--appreciate Dr. Ladona Ridgel  -concerned about right prosthetic hip--09/01/12 aspirate of iliotibial band with GPC (clusters+chains)  -patient states he is agreeable to hip explantation if necessary  -agree with chronic abx suppression if hip not explanted  - 8/3 blood cultures negative  #2 dysuria  -recheck UA with reflex to culture  #3 Infected retroperitoneal hematoma and infected R-hip collection  -CT of the hip and CT of the abdomen and pelvis worrisome for complex fluid collection around the right hip and also retroperitoneal hematoma. Surgery signed off  -remain concern about infected hipprosthesis  -appreciate orthopedic follow up--for I&D and exploration 09/02/12  -Interventional radiology s/p aspiration and drainage or retroperitoneal hematoma 08/29/12.  -09/01/12 IR aspiration of IT band-->S.aureus -09/02/12--removal of all synovium, revision of femoral head, and polyethylene liner replacement by Dr. Turner Daniels-- #4 supratherapeutic INR/Afib  -INR 7.19 on day of admission  -Status post vitamin K doses x2 and FFP  -remain off anticoagulation for nowin anticipation for surgery on 09/02/2012  -CHADS-VasC= 3-->in light of the patient's retroperitoneal hematoma,I have discussed the risks, benefits, and alternatives of ultimately restarting anticoagulation with warfarin versus aspirin when the patient is stable  -family and patient wish to start aspirin. #5 right lower extremity weakness and back pain  Likely secondary to infected retroperitoneal hematoma and possible complex fluid collection noted around the right hip  #6 acute renal failure/dehydration/hypokalemia  -improving  -Likely combination of hypovolemia and ATN   -Patient also noted to be in Zaroxolyn which is the likely etiology of this is hypokalemia. Replete potassium. Continue daily potassium.  #7 leukocytosis  -Improving on abx overall #8 right-sided abdominal tenderness  Clinical improvement. Likely secondary to retroperitoneal hematoma. Abdominal ultrasound and abdominal films negative for any acute abnormalities. Abdominal ultrasound positive for hepatic steatosis. Follow. Pain management. Supportive care.  #9 atrial fibrillation  Continue beta blocker for rate control. INR has been reversed  -Patient is status post permanent pacemaker.  -09/02/2012--patient was on a diltiazem drip postoperatively. -Patient lesions addition to oral diltiazem 30 mg every 6 hours. I will discontinue diltiazem drip 1 hour after oral diltiazem was given. #10 normocytic anemia  Stable. Follow H&H.  #11 type 2 diabetes  Continue sliding scale insulin.  Family Communication: Wife and daughter at beside  Disposition Plan: SNF when medically stable  Antibiotics:  IV vancomycin 08/26/2012--->08/29/12  IV Rocephin 08/26/2012---. 08/27/12  IV Ancef 08/28/2012-->  Rifampin 09/01/12--> Family Communication: Wife and daughter at beside  Disposition Plan: SNF when medically stable      Procedures/Studies: Ct Abdomen Pelvis Wo Contrast  08/28/2012   *RADIOLOGY REPORT*  Clinical Data: Evaluate abscess.  CT ABDOMEN AND PELVIS WITHOUT CONTRAST  Technique:  Multidetector CT imaging of the abdomen and pelvis was performed following the standard protocol without intravenous contrast.  Comparison: CT of the right hip from the same date.  Findings:  BODY WALL: Unremarkable.  LOWER CHEST:  Mediastinum: Single chamber right ventricular pacer.  Coronary artery atherosclerosis.  Lungs/pleura: Small bilateral pleural effusions and associated atelectasis.  ABDOMEN/PELVIS:  Liver: No focal abnormality.  Biliary: Layering stones.  No evidence of acute cholecystitis.  Pancreas: Fatty  atrophy.  Spleen: Unremarkable.  Adrenals: Unremarkable.  Kidneys and ureters: There are two exophytic right renal lesions in the interpolar region.  The largest measures 2.6 cm and  is compatible with cyst - confirmed by recent sonography.  The second measures 15 mm and is soft tissue density. This was not definitely seen on prior ultrasound. This lesion may have grown mildly since imaging 01/20/2012.  No hydronephrosis or stone.  Bladder: Mild distension by urine.  Bowel:  No definite communication between the bowel and retroperitoneal collection.  Unfortunately, enteric contrast has not yet reached the colon at time of imaging.   Normal appendix.  Retroperitoneum:Extensive right retroperitoneal hematoma gas and fluid collection, involving the lower posterior pararenal space, the right iliacus muscle, proximal right rectus femoris, iliopsoas bursa.  The largest posterior pararenal component is at the pelvic brim, measuring 9 x 4 by 6 cm.  The psoas muscle is spared.  No evidence of epidural extension of infection.  The neighboring osseous structures show no evidence of osteomyelitis.  Evaluation of the total right hip prosthesis was better accomplished on dedicated CT imaging. There is a relatively large collection posterior to the right femoral neck and greater trochanter, containing fluid and gas, measuring 7 x 3 x 3 cm. High density material present within the expanded right iliacus, measuring approximately 5 cm in diameter.  Peritoneum: No free fluid or gas.  Reproductive: Prostate enlargement calcifications.  Vascular: Extensive atherosclerotic calcification.  OSSEOUS: Total right hip prosthesis with surrounding fluid collections, as above.  Extensive degenerative disc disease with multilevel fusion and laminectomy.  No evidence of disc space infection. No suspicious lytic or blastic lesions.  IMPRESSION:  1.  Extensive complex fluid collection, likely abscess, involving the lower right retroperitoneum and  right periarticular hip - size and extent described above. Although there is extensive gas within the collection, no bowel communication identified.  Probable hematoma within the iliacus component.  2. Indeterminate, possibly solid 15 mm lesion in the interpolar right kidney.  Followup imaging advised.  3. Cholelithiasis.   Original Report Authenticated By: Tiburcio Pea   Dg Chest 2 View  09/01/2012   *RADIOLOGY REPORT*  Clinical Data: Interval removal of cardiac pacer apparatus.  CHEST - 2 VIEW  Comparison: Chest radiograph the 02/15/2012  Findings: Stable enlarged cardiac and mediastinal contours with calcification of the transverse thoracic aorta.  No consolidative pulmonary opacities.  No pleural effusion or pneumothorax. Mid thoracic spine degenerative change.  IMPRESSION: No acute cardiopulmonary process.   Original Report Authenticated By: Annia Belt, M.D   Dg Hip Complete Right  08/28/2012   *RADIOLOGY REPORT*  Clinical Data: 79 year old male with right hip pain.  RIGHT HIP - COMPLETE 2+ VIEW  Comparison: 11/26/2010 radiographs  Findings: Right total hip replacement again identified. There is no evidence of fracture, subluxation or dislocation. No complicating features are identified.  IMPRESSION: Right total hip replacement without acute abnormality.   Original Report Authenticated By: Harmon Pier, M.D.   Ct Lumbar Spine Wo Contrast  08/27/2012   *RADIOLOGY REPORT*  Clinical Data: Low back/right leg pain, fever, positive blood cultures  CT LUMBAR SPINE WITHOUT CONTRAST  Technique:  Multidetector CT imaging of the lumbar spine was performed without intravenous contrast administration. Multiplanar CT image reconstructions were also generated.  Comparison: CT myelogram dated 02/10/2012.  Findings: No evidence of fracture or dislocation.  Vertebral body heights are maintained.  Postsurgical changes related to prior T12-L1 posterior laminectomy/discectomy and prior L4-S1 posterior laminectomy.  Prior L3-4  fusion.  Moderate to severe multilevel degenerative changes, unchanged from prior CT myelogram.  This includes mild to moderate central canal stenosis of the upper lumbar spine and moderate to severe central  canal stenosis of the lower lumbar spine with nerve root encroachment at multiple levels.  No evidence of epidural fluid collection/abscess.  Complex fluid collection in the right abdomen/pelvis, incompletely visualized.  IMPRESSION: Postsurgical changes involving the lower thoracolumbar spine.  Stable moderate to severe multilevel degenerative changes, unchanged from prior CT myelogram.  No evidence of epidural fluid collection/abscess.  Complex fluid collection in the right abdomen/pelvis, incompletely visualized.  Please refer to CT right hip report for further assessment.   Original Report Authenticated By: Charline Bills, M.D.   US Abdomen Complete  08/26/2012   *RADIOLOGY REPORT*  Clinical Data:  79 year old with abnormal liver function tests.  COMPLETE ABDOMINAL ULTRASOUND  Comparison:  Unenhanced CT abdomen and pelvis 1October 05, 202013 [place].  Abdominal ultrasound 05/21/2011 [facility].  Findings:  Gallbladder:  Small shadowing gallstones.  Patient unable to turn into the lateral decubitus position to confirm movement of the stones, but these were present on the prior ultrasound examination. No gallbladder wall thickening or pericholecystic fluid.  Negative sonographic Murphy's sign according to the ultrasound technologist.  Common bile duct:  Normal in caliber with maximum diameter approximating 3 mm.  Liver:  Diffusely increased and coarsened echotexture without focal parenchymal abnormality.  Patent portal vein with hepatopetal flow.  IVC:  Patent in its intrahepatic portion.  Obscured outside the liver by bowel gas.  Pancreas:  Obscured by midline bowel gas and therefore not evaluated.  Spleen:  Normal size and echotexture without focal parenchymal abnormality.  Right Kidney:  No  hydronephrosis.  Well-preserved cortex for age. Cysts arising from the mid and lower pole as noted previously, the cyst arising from the mid kidney approximating 2.8 cm and the cyst arising from the lower pole approximating 3.1 cm.  No solid renal masses.  Normal parenchymal echotexture.  Approximately 12.0 cm in length.  Left Kidney:  No hydronephrosis.  Well-preserved cortex for age. Normal parenchymal echotexture.  No focal parenchymal abnormality. Approximately 10.7 cm in length.  Abdominal aorta:  Obscured by midline bowel gas and therefore not evaluated.  IMPRESSION:  1.  Cholelithiasis.  No sonographic evidence of acute cholecystitis.  No biliary ductal dilation. 2.  Diffuse hepatic steatosis without focal hepatic parenchymal abnormality. 3.  Midline structures (pancreas, extrahepatic IVC, and abdominal aorta) obscured by overlying bowel gas and therefore not evaluated.   Original Report Authenticated By: Hulan Saas, M.D.   Dg Pelvis Portable  09/02/2012   *RADIOLOGY REPORT*  Clinical Data: Revision of left total hip arthroplasty.  PORTABLE PELVIS  Comparison: Pelvic CT 08/29/2012 and 08/27/2012.  Findings: Two right-sided pelvic drains appear unchanged in position.  The patient is status post right total hip arthroplasty. Surgical drains, soft tissue emphysema and skin staples are in place consistent with recent revision.  The native left hip appears normal.  Degenerative changes are present throughout the spine.  IMPRESSION: No demonstrated complication following revision of RIGHT total hip arthroplasty.   Original Report Authenticated By: Carey Bullocks, M.D.   Ct Hip Right Wo Contrast  08/27/2012   *RADIOLOGY REPORT*  Clinical Data: Low back/right leg pain, fever, positive blood cultures  CT OF THE RIGHT HIP WITHOUT CONTRAST  Technique:  Multidetector CT imaging was performed according to the standard protocol. Multiplanar CT image reconstructions were also generated.  Comparison: CT abdomen  pelvis dated 1October 05, 202013.  Findings: Right total hip arthroplasty.  No evidence of fracture or dislocation.  Complex fluid collection/debris involving the right hip (series 5/image 36), right iliopsoas/iliacus muscles (series 5/images 9 and 23), and  the visualized right lower retroperitoneum (series 5/image 1).  Representative axial measurements include: --3.8 x 8.3 cm in the right retroperitoneum (series 5/image 5) --9.2 x 5.6 cm in the right iliacus muscle (series 5/image 13) --12.7 x 7.3 cm along the right hip (series 5/image 37)  Based on this study, it is unclear whether this reflects a right hip infection which secondarily involves the lower abdomen/pelvis or whether this originated in the lower abdomen/pelvis and now extends to the right hip.  IMPRESSION: Right total hip arthroplasty.  Complex fluid collection/debris involving the right hip, right iliopsoas/iliacus muscles, and visualized right lower retroperitoneum, as described above.  Based on this study, it is unclear whether this reflects a right hip infection which secondarily involves the lower abdomen/pelvis (favored) or whether this originated in the lower abdomen/pelvis and now extends to the right hip.  Consider CT abdomen/pelvis (preferably with half dose contrast) for further evaluation.  These results were called by telephone on 08/27/2012 at 2000 hrs to Dr. Tressie Stalker, who verbally acknowledged these results.   Original Report Authenticated By: Charline Bills, M.D.   Dg Chest Port 1 View  08/26/2012   *RADIOLOGY REPORT*  Clinical Data: Elevated white blood count  PORTABLE CHEST - 1 VIEW  Comparison: November 11, 2010.  Findings: Stable cardiomediastinal silhouette.  Left-sided pacemaker is unchanged in position.  No acute pulmonary disease is noted.  No pneumothorax or pleural effusion is noted.  Degenerative joint disease is seen involving both shoulders.  IMPRESSION: No acute cardiopulmonary abnormality seen.   Original Report  Authenticated By: Lupita Raider.,  M.D.   Dg Abd Portable 2v  08/26/2012   *RADIOLOGY REPORT*  Clinical Data: Abdominal pain.  PORTABLE ABDOMEN - 2 VIEW  Comparison: [facility] images for CT scan of the lumbar spine dated 02/10/2012  Findings: There is no free air in the abdomen.  There is air scattered throughout nondistended loops of large and small bowel. No excessive stool in the colon.  Diffuse degenerative changes of the lumbar spine.  Right hip prosthesis.  IMPRESSION: No acute abnormalities.   Original Report Authenticated By: Francene Boyers, M.D.   Ct Image Guided Fluid Drain By Catheter  08/29/2012   *RADIOLOGY REPORT*  Clinical Data: Right-sided retroperitoneal fluid collections containing air.  CT GUIDED DRAINAGE OF RETROPERITONEAL ABSCESS x 2  Sedation:  2.0 mg IV Versed;  50 mcg IV Fentanyl  Total Moderate Sedation Time: 36 minutes.  Procedure:  The procedure, risks, benefits, and alternatives were explained to the patient.  Questions regarding the procedure were encouraged and answered. The patient understands and consents to the procedure.  The right lateral abdominal wall was prepped with Betadine in a sterile fashion, and a sterile drape was applied covering the operative field.  A sterile gown and sterile gloves were used for the procedure. Local anesthesia was provided with 1% Lidocaine.  CT was performed in a supine position with the right side rolled up slightly.  Under CT guidance, two separate 18 gauge trocar needles were advanced into both anterior and posterior retroperitoneal fluid collections on the right.  Fluid aspiration was performed and a fluid sample sent for culture analysis.  Guide wires were advanced into the two dominant pockets.  Tracts were dilated and 12-French percutaneous drainage catheters placed. Catheter position was confirmed by CT.  The catheters were flushed with saline and connected to suction bulbs.  Both catheters were secured at the skin with Prolene retention  sutures and adhesive Stat-Lock devices.  Complications: None  Findings: It did appear that the dominant retroperitoneal fluid collections do not appear to show good communication with two dominant pockets identified.  Aspiration at the level of the anterior dominant pocket revealed liquefied blood which was not grossly turbid.  Aspiration of the dominant posterior retroperitoneal collection in the iliac fossa yielded dark partially liquefied blood.  12-French drains were placed in both collections.  The more anterior collection shows much more significant initial return of liquefied blood.  The posterior collection is slowly draining after flushing the drainage catheter.  IMPRESSION: Both anterior and posterior dominant right retroperitoneal collections were aspirated and treated with placement of indwelling 12-French drains.  The more anterior collection demonstrated free return of liquefied blood.  The more posterior collection yielded much darker old blood product with slow return.  Both drains were connected to suction bulbs and will be flushed every 8 hours.   Original Report Authenticated By: Irish Lack, M.D.   Ct Image Guided Fluid Drain By Catheter  08/29/2012   *RADIOLOGY REPORT*  Clinical Data: Right-sided retroperitoneal fluid collections containing air.  CT GUIDED DRAINAGE OF RETROPERITONEAL ABSCESS x 2  Sedation:  2.0 mg IV Versed;  50 mcg IV Fentanyl  Total Moderate Sedation Time: 36 minutes.  Procedure:  The procedure, risks, benefits, and alternatives were explained to the patient.  Questions regarding the procedure were encouraged and answered. The patient understands and consents to the procedure.  The right lateral abdominal wall was prepped with Betadine in a sterile fashion, and a sterile drape was applied covering the operative field.  A sterile gown and sterile gloves were used for the procedure. Local anesthesia was provided with 1% Lidocaine.  CT was performed in a supine position with  the right side rolled up slightly.  Under CT guidance, two separate 18 gauge trocar needles were advanced into both anterior and posterior retroperitoneal fluid collections on the right.  Fluid aspiration was performed and a fluid sample sent for culture analysis.  Guide wires were advanced into the two dominant pockets.  Tracts were dilated and 12-French percutaneous drainage catheters placed. Catheter position was confirmed by CT.  The catheters were flushed with saline and connected to suction bulbs.  Both catheters were secured at the skin with Prolene retention sutures and adhesive Stat-Lock devices.  Complications: None  Findings: It did appear that the dominant retroperitoneal fluid collections do not appear to show good communication with two dominant pockets identified.  Aspiration at the level of the anterior dominant pocket revealed liquefied blood which was not grossly turbid.  Aspiration of the dominant posterior retroperitoneal collection in the iliac fossa yielded dark partially liquefied blood.  12-French drains were placed in both collections.  The more anterior collection shows much more significant initial return of liquefied blood.  The posterior collection is slowly draining after flushing the drainage catheter.  IMPRESSION: Both anterior and posterior dominant right retroperitoneal collections were aspirated and treated with placement of indwelling 12-French drains.  The more anterior collection demonstrated free return of liquefied blood.  The more posterior collection yielded much darker old blood product with slow return.  Both drains were connected to suction bulbs and will be flushed every 8 hours.   Original Report Authenticated By: Irish Lack, M.D.         Subjective: Patient denies any fevers, chills, chest pain or shortness of breath, nausea, vomiting, diarrhea, abdominal pain, dysuria.  Objective: Filed Vitals:   09/03/12 1118 09/03/12 1148 09/03/12 1153 09/03/12 1530   BP:  126/65 122/99 126/61  Pulse:  86 74 128  Temp: 98.7 F (37.1 C)   98.5 F (36.9 C)  TempSrc: Oral   Oral  Resp:  20 18 20   Height:      Weight:      SpO2:  95% 95% 97%    Intake/Output Summary (Last 24 hours) at 09/03/12 1753 Last data filed at 09/03/12 1600  Gross per 24 hour  Intake    640 ml  Output   2290 ml  Net  -1650 ml   Weight change:  Exam:   General:  Pt is alert, follows commands appropriately, not in acute distress  HEENT: No icterus, No thrush,  [place]/AT  Cardiovascular: IRRR, S1/S2, no rubs, no gallops  Respiratory: Diminished breath sounds at the bases but clear auscultation.  Abdomen: Soft/+BS, right lower quadrant tenderness at the site of the drains, non distended, no guarding  Extremities: No edema, No lymphangitis, No petechiae, No rashes, no synovitis  Data Reviewed: Basic Metabolic Panel:  Recent Labs Lab 08/29/12 0540 08/30/12 0600 09/01/12 0620 09/02/12 0540 09/03/12 0530  NA 137 136 135 132* 132*  K 3.6 3.7 3.8 3.8 4.6  CL 93* 94* 95* 94* 97  CO2 34* 32 31 28 28   GLUCOSE 141* 140* 100* 86 141*  BUN 31* 28* 21 19 20   CREATININE 1.43* 1.28 1.16 1.15 1.41*  CALCIUM 8.7 8.8 9.0 9.0 8.5   Liver Function Tests:  Recent Labs Lab 08/28/12 0035  AST 82*  ALT 49  ALKPHOS 94  BILITOT 1.3*  PROT 6.3  ALBUMIN 1.9*   No results found for this basename: LIPASE, AMYLASE,  in the last 168 hours No results found for this basename: AMMONIA,  in the last 168 hours CBC:  Recent Labs Lab 08/29/12 0540 08/30/12 0600 09/01/12 0620 09/02/12 0540 09/03/12 0530  WBC 15.4* 13.2* 14.1* 12.5* 15.6*  NEUTROABS 13.4* 11.2*  --   --   --   HGB 10.2* 10.3* 10.6* 10.5* 10.8*  HCT 30.9* 31.3* 31.9* 32.1* 31.7*  MCV 90.4 89.9 89.9 89.7 88.1  PLT 246 252 292 310 303   Cardiac Enzymes: No results found for this basename: CKTOTAL, CKMB, CKMBINDEX, TROPONINI,  in the last 168 hours BNP: No components found with this basename: POCBNP,   CBG:  Recent Labs Lab 09/02/12 1631 09/02/12 2236 09/03/12 0748 09/03/12 1111 09/03/12 1546  GLUCAP 95 96 152* 164* 163*    Recent Results (from the past 240 hour(s))  URINE CULTURE     Status: None   Collection Time    08/26/12  7:32 AM      Result Value Range Status   Specimen Description URINE, CLEAN CATCH   Final   Special Requests NONE   Final   Culture  Setup Time 08/26/2012 21:40   Final   Colony Count >=100,000 COLONIES/ML   Final   Culture KLEBSIELLA OZAENAE   Final   Report Status 08/29/2012 FINAL   Final   Organism ID, Bacteria KLEBSIELLA OZAENAE   Final  CULTURE, BLOOD (ROUTINE X 2)     Status: None   Collection Time    08/26/12 11:20 AM      Result Value Range Status   Specimen Description BLOOD LEFT ARM   Final   Special Requests BOTTLES DRAWN AEROBIC AND ANAEROBIC 10CC   Final   Culture  Setup Time 08/26/2012 16:54   Final   Culture     Final   Value: STAPHYLOCOCCUS AUREUS  Note: SUSCEPTIBILITIES PERFORMED ON PREVIOUS CULTURE WITHIN THE LAST 5 DAYS.     2 Note: Gram Stain Report Called to,Read Back By and Verified With: KELSEY [place] RN ON8 14 @420  EDMOJ   Report Status 08/29/2012 FINAL   Final  CULTURE, BLOOD (ROUTINE X 2)     Status: None   Collection Time    08/26/12 11:30 AM      Result Value Range Status   Specimen Description BLOOD LEFT HAND   Final   Special Requests     Final   Value: BOTTLES DRAWN AEROBIC AND ANAEROBIC AERO 10CC ANA 5CC   Culture  Setup Time 08/26/2012 16:54   Final   Culture     Final   Value: STAPHYLOCOCCUS AUREUS     Note: RIFAMPIN AND GENTAMICIN SHOULD NOT BE USED AS SINGLE DRUGS FOR TREATMENT OF STAPH INFECTIONS.     2 Note: Gram Stain Report Called to,Read Back By and Verified With: KELSEY [place] RN ON8 14 @420  EDMOJ   Report Status 08/29/2012 FINAL   Final   Organism ID, Bacteria STAPHYLOCOCCUS AUREUS   Final  CULTURE, BLOOD (ROUTINE X 2)     Status: None   Collection Time    08/28/12 12:45 AM      Result  Value Range Status   Specimen Description BLOOD RIGHT HAND   Final   Special Requests BOTTLES DRAWN AEROBIC AND ANAEROBIC 10CC   Final   Culture  Setup Time     Final   Value: 08/28/2012 15:05     Performed at [facility]   Culture     Final   Value: NO GROWTH 5 DAYS     Performed at [facility]   Report Status 09/03/2012 FINAL   Final  CULTURE, BLOOD (ROUTINE X 2)     Status: None   Collection Time    08/28/12 12:55 AM      Result Value Range Status   Specimen Description BLOOD RIGHT HAND   Final   Special Requests BOTTLES DRAWN AEROBIC ONLY 10CC   Final   Culture  Setup Time     Final   Value: 08/28/2012 15:05     Performed at [facility]   Culture     Final   Value: NO GROWTH 5 DAYS     [facility]formed at [facility] [employer]   Report Status 09/03/2012 FINAL   Final  CULTURE, ROUTINE-ABSCESS     Status: None   Collection Time    08/29/12  3:01 PM      Result Value Range Status   Specimen Description ABSCESS   Final   Special Requests RIGHT RETROPERITONEAL HEMATOMA   Final   Gram Stain     Final   Value: MODERATE WBC PRESENT, PREDOMINANTLY PMN     NO SQUAMOUS EPITHELIAL CELLS SEEN     MODERATE GRAM POSITIVE COCCI IN CLUSTERS     Performed at [facility]   Culture     Final   Value: ABUNDANT STAPHYLOCOCCUS AUREUS     Note: RIFAMPIN AND GENTAMICIN SHOULD NOT BE USED AS SINGLE DRUGS FOR TREATMENT OF STAPH INFECTIONS.     Performed at [facility]   Report Status 09/01/2012 FINAL   Final   Organism ID, Bacteria STAPHYLOCOCCUS AUREUS   Final  ANAEROBIC CULTURE     Status: None   Collection Time    08/29/12  3:01 PM      Result Value Range Status  Specimen Description ABSCESS   Final   Special Requests RIGHT RETROPERITONEAL HEMATOMA   Final   Gram Stain     Final   Value: MODERATE WBC PRESENT, PREDOMINANTLY PMN     NO SQUAMOUS EPITHELIAL CELLS SEEN     M0 GRAM POSITIVE COCCI     IN PAIRS INCISION     Performed at [facility]   Culture     Final   Value: NO ANAEROBES ISOLATED     Performed at [facility]   Report Status 09/03/2012 FINAL   Final  SURGICAL PCR SCREEN     Status: None   Collection Time    08/31/12  7:00 AM      Result Value Range Status   MRSA, PCR NEGATIVE  NEGATIVE Final   Staphylococcus aureus NEGATIVE  NEGATIVE Final   Comment:            The Xpert SA Assay (FDA     approved for NASAL specimens     in patients over 19 years of age),     is one component of     a comprehensive surveillance     program.  Test performance has     been validated by [employer] for patients greater     than or equal to 71 year old.     It is not intended     to diagnose infection nor to     guide or monitor treatment.  CULTURE, BLOOD (ROUTINE X 2)     Status: None   Collection Time    09/01/12  6:30 AM      Result Value Range Status   Specimen Description BLOOD RIGHT HAND   Final   Special Requests     Final   Value: BOTTLES DRAWN AEROBIC AND ANAEROBIC 10CC AEROBIC 6CC ANAEROBIC   Culture  Setup Time     Final   Value: 09/01/2012 11:05     Performed at [facility]   Culture     Final   Value:        BLOOD CULTURE RECEIVED NO GROWTH TO DATE CULTURE WILL BE HELD FOR 5 DAYS BEFORE ISSUING A FINAL NEGATIVE REPORT     Performed at [facility]   Report Status PENDING   Incomplete  CULTURE, BLOOD (ROUTINE X 2)     Status: None   Collection Time    09/01/12  6:35 AM      Result Value Range Status   Specimen Description BLOOD LEFT HAND   Final   Special Requests BOTTLES DRAWN AEROBIC ONLY 10CC   Final   Culture  Setup Time     Final   Value: 09/01/2012 11:05     Performed at [facility]   Culture     Final   Value:        BLOOD CULTURE RECEIVED NO GROWTH TO DATE CULTURE WILL BE HELD FOR 5 DAYS BEFORE ISSUING A FINAL NEGATIVE REPORT     Performed at [facility]   Report Status PENDING   Incomplete  ANAEROBIC CULTURE     Status: None    Collection Time    09/01/12  9:08 AM      Result Value Range Status   Specimen Description ABSCESS HIP RIGHT   Final   Special Requests RIGHT HIP MUSCULAR ABSCESS   Final   Gram Stain     Final   Value: ABUNDANT  WBC PRESENT,BOTH PMN AND MONONUCLEAR     MODERATE GRAM POSITIVE COCCI     IN PAIRS IN CLUSTERS Performed at [facility]     Performed at [facility]   Culture     Final   Value: NO ANAEROBES ISOLATED; CULTURE IN PROGRESS FOR 5 DAYS     Performed at [facility]   Report Status PENDING   Incomplete  GRAM STAIN     Status: None   Collection Time    09/01/12  9:08 AM      Result Value Range Status   Specimen Description ABSCESS HIP RIGHT   Final   Special Requests RIGHT HIP MUSCULAR ABSCESS   Final   Gram Stain     Final   Value: ABUNDANT WBC PRESENT,BOTH PMN AND MONONUCLEAR     MODERATE GRAM POSITIVE COCCI IN PAIRS IN CLUSTERS     CALLED TO A RODRIGUEZ,RN 09/01/12 0955 BY K SCHULTZ   Report Status 09/01/2012 FINAL   Final  CULTURE, ROUTINE-ABSCESS     Status: None   Collection Time    09/01/12  9:08 AM      Result Value Range Status   Specimen Description ABSCESS HIP RIGHT   Final   Special Requests RIGHT HIP MUSCULAR ABSCESS   Final   Gram Stain     Final   Value: ABUNDANT WBC PRESENT,BOTH PMN AND MONONUCLEAR     MODERATE GRAM POSITIVE COCCI     IN PAIRS IN CLUSTERS Performed at [facility]     Performed at [facility]   Culture     Final   Value: FEW STAPHYLOCOCCUS AUREUS     Note: RIFAMPIN AND GENTAMICIN SHOULD NOT BE USED AS SINGLE DRUGS FOR TREATMENT OF STAPH INFECTIONS.     Performed at [facility]   Report Status PENDING   Incomplete  TISSUE CULTURE     Status: None   Collection Time    09/02/12  2:15 PM      Result Value Range Status   Specimen Description TISSUE RIGHT LEG   Final   Special Requests POF ANCEF POSTERIOR HIP CAPSULE   Final   Gram Stain     Final   Value: NO WBC SEEN     NO ORGANISMS SEEN      Performed at [facility]   Culture     Final   Value: NO GROWTH     Performed at [facility]   Report Status PENDING   Incomplete     Scheduled Meds: . [START ON 09/04/2012] aspirin  81 mg Oral Daily  .  ceFAZolin (ANCEF) IV  1 g Intravenous Q8H  . docusate sodium  100 mg Oral BID  . fentaNYL  50-100 mcg Intravenous Once  . finasteride  5 mg Oral Daily  . insulin aspart  0-9 Units Subcutaneous TID WC  . levothyroxine  75 mcg Oral QAC breakfast  . metolazone  2.5 mg Oral Q M,W,F  . metoprolol succinate  12.5 mg Oral Daily  . potassium chloride  40 mEq Oral Daily  . rifampin  300 mg Oral Q12H   Continuous Infusions: . dextrose 5 % and 0.45 % NaCl with KCl 20 mEq/L 100 mL/hr at 09/03/12 0826  . diltiazem (CARDIZEM) infusion 10 mg/hr (09/03/12 0825)     Filimon Miranda, DO  [facility] Pager [phone]  If 7PM-7AM, please contact night-coverage [email or web address] Password [email or web address] 09/03/2012, 5:53 PM   LOS: 9  days

## 2012-09-03 NOTE — Progress Notes (Signed)
Orthopedic Tech Progress Note Patient Details:  [name] May 01, 1930 [id number] Patient unable to use frame.     [name] 09/03/2012, 3:34 PM

## 2012-09-03 NOTE — Progress Notes (Signed)
PATIENT ID: Josue Mcdermid   1 Day Post-Op Procedure(s) (LRB): IRRIGATION AND DEBRIDEMENT RIGHT HIP (Right) REVISION OF POLYETHYLENE LINER AND FEMORAL HEAD  (Right)  Subjective: no complaints.  No pain in the hip right now.  Objective:  Filed Vitals:   09/03/12 0751  BP:   Pulse:   Temp: 98.6 F (37 C)  Resp:      Right hip dressing clean dry and intact.  Drains intact.  Distally neurovascularly intact.  Labs:   Recent Labs  09/01/12 0620 09/02/12 0540 09/03/12 0530  HGB 10.6* 10.5* 10.8*   Recent Labs  09/02/12 0540 09/03/12 0530  WBC 12.5* 15.6*  RBC 3.58* 3.60*  HCT 32.1* 31.7*  PLT 310 303   Recent Labs  09/02/12 0540 09/03/12 0530  NA 132* 132*  K 3.8 4.6  CL 94* 97  CO2 28 28  BUN 19 20  CREATININE 1.15 1.41*  GLUCOSE 86 141*  CALCIUM 9.0 8.5    Assessment and Plan:postoperative day #1 status post irrigation and debridement right hip Bed to chair with PT today. Antibiotics per primary team. Leave drains over the weekend.

## 2012-09-03 NOTE — Evaluation (Signed)
Physical Therapy Evaluation Patient Details Name: [name] DOB: 1930-03-31 Today's Date: 09/03/2012 Time: 1120-1212 PT Time Calculation (min): 52 min  PT Assessment / Plan / Recommendation History of Present Illness  [name] is a 79 y.o. male with  Pacemaker, Right prosthetic hip, Laminectomy in January of 2014 admitted with right leg pain weakness fevers. He has been found to have MSSA bacteremia and a large retroperitoneal abscess that is adjacent to his prosthetic hip.   Pt with s/p I & D right hip with R THA revision.  Clinical Impression  Pt limited due to overall fatigue from prolong hospital admission and currently limited on evaluation due to dizziness and nausea.  Pt very motivated and willing to work therapy.  Pt will benefit from acute PT services to improve overall mobility and prepare for safe d/c to next venue.  Pt will highly benefit from inpatient rehabilitation to increase overall independence and improve strength and endurance.     PT Assessment  Patient needs continued PT services    Follow Up Recommendations  CIR;Supervision/Assistance - 24 hour    Equipment Recommendations  None recommended by PT    Recommendations for Other Services Rehab consult   Frequency Min 5X/week    Precautions / Restrictions Precautions Precautions: Fall;Posterior Hip Precaution Comments: Pt able to recall 2/3 hip precautions and needed a reminder of no bending past 90 degrees Restrictions Weight Bearing Restrictions: Yes   Pertinent Vitals/Pain 6/10 right hip pain      Mobility  Bed Mobility Bed Mobility: Supine to Sit;Sitting - Scoot to Edge of Bed Supine to Sit: 1: +2 Total assist;With rails Supine to Sit: Patient Percentage: 40% Sitting - Scoot to Edge of Bed: 1: +2 Total assist Sitting - Scoot to Edge of Bed: Patient Percentage: 60% Sit to Supine: 1: +2 Total assist;HOB flat Sit to Supine: Patient Percentage: 30% Details for Bed Mobility  Assistance: +2 (A) with all mobility with max cues for proper technique.  (A) with LE and trunk in/out of bed Transfers Transfers: Sit to Stand;Stand to Sit Sit to Stand: 1: +2 Total assist;From elevated surface;From bed Sit to Stand: Patient Percentage: 30% Stand to Sit: 1: +2 Total assist;To elevated surface;To bed Stand to Sit: Patient Percentage: 30% Details for Transfer Assistance: Attempted to stand x 2 however pt unable to stand completely upright and limited due to nausea Ambulation/Gait Ambulation/Gait Assistance: Not tested (comment)    Exercises     PT Diagnosis: Difficulty walking;Generalized weakness;Acute pain  PT Problem List: Decreased strength;Decreased range of motion;Decreased activity tolerance;Decreased balance;Decreased mobility;Decreased coordination;Decreased knowledge of use of DME;Decreased safety awareness;Pain PT Treatment Interventions: DME instruction;Gait training;Stair training;Functional mobility training;Therapeutic activities;Therapeutic exercise;Balance training;Patient/family education     PT Goals(Current goals can be found in the care plan section) Acute Rehab PT Goals Patient Stated Goal: To feel better and to get moving PT Goal Formulation: With patient Time For Goal Achievement: 09/17/12 Potential to Achieve Goals: Good  Visit Information  Last PT Received On: 09/03/12 History of Present Illness: [name] is a 79 y.o. male with  Pacemaker, Right prosthetic hip, Laminectomy in January of 2014 admitted with right leg pain weakness fevers. He has been found to have MSSA bacteremia and a large retroperitoneal abscess that is adjacent to his prosthetic hip.   Pt with s/p I & D right hip with R THA revision.       Prior Functioning  Home Living Family/patient expects to be discharged to:: Private residence Living Arrangements: Spouse/significant  other Available Help at Discharge: Family Type of Home: House Home Access: Stairs to  enter [employer] of Steps: 3 Entrance Stairs-Rails: None Home Layout: Able to live on main level with bedroom/bathroom;Two level Home Equipment: Walker - 2 wheels;Cane - single point;Bedside commode;Grab bars - tub/shower Prior Function Level of Independence: Needs assistance Gait / Transfers Assistance Needed: Occasional use of RW vs cane Comments: Pt reports progressive decline since 01/19/12 Communication Communication: HOH Dominant Hand: Right    Cognition  Cognition Arousal/Alertness: Awake/alert Behavior During Therapy: WFL for tasks assessed/performed Overall Cognitive Status: Within Functional Limits for tasks assessed    Extremity/Trunk Assessment Lower Extremity Assessment Lower Extremity Assessment: RLE deficits/detail RLE Deficits / Details: unable to fully assess due to pain and recent sx RLE: Unable to fully assess due to pain   Balance Balance Balance Assessed: Yes Static Sitting Balance Static Sitting - Balance Support: Feet supported;Bilateral upper extremity supported Static Sitting - Level of Assistance: 2: Max assist;4: Min assist Static Sitting - Comment/# of Minutes: Initial max (A) to prevent left lateral lean and cues to promote midline.  Pt able to progress to min(A) with intermittent minguard.   End of Session PT - End of Session Equipment Utilized During Treatment: Gait belt Activity Tolerance: Patient limited by fatigue;Patient limited by pain Patient left: in bed;with call bell/phone within reach;with nursing/sitter in room Nurse Communication: Mobility status;Need for lift equipment;Other (comment) (IV leaking)  GP     [name] 09/03/2012, 1:21 PM  [place], PT DPT [phone]

## 2012-09-04 LAB — CULTURE, ROUTINE-ABSCESS

## 2012-09-04 LAB — GLUCOSE, CAPILLARY
Glucose-Capillary: 118 mg/dL — ABNORMAL HIGH (ref 70–99)
Glucose-Capillary: 126 mg/dL — ABNORMAL HIGH (ref 70–99)
Glucose-Capillary: 123 mg/dL — ABNORMAL HIGH (ref 70–99)

## 2012-09-04 LAB — CBC
HCT: 31.5 % — ABNORMAL LOW (ref 39.0–52.0)
Hemoglobin: 10.9 g/dL — ABNORMAL LOW (ref 13.0–17.0)
MCV: 88 fL (ref 78.0–100.0)
RBC: 3.58 MIL/uL — ABNORMAL LOW (ref 4.22–5.81)
RDW: 16 % — ABNORMAL HIGH (ref 11.5–15.5)
WBC: 15 10*3/uL — ABNORMAL HIGH (ref 4.0–10.5)

## 2012-09-04 LAB — BASIC METABOLIC PANEL
BUN: 16 mg/dL (ref 6–23)
CO2: 31 mEq/L (ref 19–32)
Chloride: 92 mEq/L — ABNORMAL LOW (ref 96–112)
GFR calc non Af Amer: 52 mL/min — ABNORMAL LOW (ref >90)
Glucose, Bld: 196 mg/dL — ABNORMAL HIGH (ref 70–99)
Potassium: 4 mEq/L (ref 3.5–5.1)
Sodium: 129 mEq/L — ABNORMAL LOW (ref 135–145)

## 2012-09-04 MED ORDER — ASPIRIN 325 MG PO TABS
325.0000 mg | ORAL_TABLET | Freq: Every day | ORAL | Status: DC
  Administered 2012-09-04 – 2012-09-08 (×5): 325 mg via ORAL
  Filled 2012-09-04 (×5): qty 1

## 2012-09-04 MED ORDER — SODIUM CHLORIDE 0.9 % IJ SOLN
INTRAMUSCULAR | Status: AC
  Administered 2012-09-04: 30 mL
  Filled 2012-09-04: qty 10

## 2012-09-04 MED ORDER — DILTIAZEM HCL ER COATED BEADS 120 MG PO CP24
120.0000 mg | ORAL_CAPSULE | Freq: Every day | ORAL | Status: DC
  Administered 2012-09-04 – 2012-09-08 (×5): 120 mg via ORAL
  Filled 2012-09-04 (×5): qty 1

## 2012-09-04 NOTE — Progress Notes (Signed)
[facility] PROGRESS NOTE  [name] YNW:[id number] DOB: [birth year] DOA: 08/25/2012 PCP: Malka So., MD  Assessment/Plan: #1 MSSA bacteremia  -PICC line if 09/01/12 blood culture remains neg (08/28/12 blood cult NEG) -rifamipin started 09/01/12,cefazolin started 08/28/2012 -infected retroperitoneal fluid collection-->MSSA  -TEE--no vegetations  -08/31/12 PPM removed--appreciate Dr. Ladona Ridgel  -concerned about right prosthetic hip--09/01/12 aspirate of iliotibial band--MSSA -patient states he is agreeable to hip explantation if necessary  -agree with chronic abx suppression if hip not explanted    #2 dysuria  -recheck UA--no pyuria #3 Infected retroperitoneal hematoma and infected R-hip collection  -CT of the hip and CT of the abdomen and pelvis worrisome for complex fluid collection around the right hip and also retroperitoneal hematoma. Surgery signed off  -remain concern about infected hipprosthesis  -appreciate orthopedic follow up- I&D and exploration 09/02/12  -Interventional radiology s/p aspiration and drainage or retroperitoneal hematoma 08/29/12.  -09/01/12 IR aspiration of IT band-->S.aureus  -09/02/12--removal of all synovium, revision of femoral head, and polyethylene liner replacement by Dr. Turner Daniels-- follow culture data -discussed with Dr. Cyndia Skeeters Limited to transfer from bed to chair only #4 supratherapeutic INR/Afib  -INR 7.19 on day of admission  -Status post vitamin K doses x2 and FFP  -remain off anticoagulation for nowin anticipation for surgery on 09/02/2012  -CHADS-VasC= 3-->in light of the patient's retroperitoneal hematoma,I have discussed the risks, benefits, and alternatives of ultimately restarting anticoagulation with warfarin versus aspirin when the patient is stable  -family and patient wish to start aspirin.  #5 right lower extremity weakness and back pain  Likely secondary to infected retroperitoneal hematoma and possible complex fluid collection  noted around the right hip  #6 acute renal failure/dehydration/hypokalemia  -improving  -Likely combination of hypovolemia and ATN  -Patient also noted to be in Zaroxolyn which is the likely etiology of this is hypokalemia. Replete potassium. Continue daily potassium.  #7 leukocytosis  -stable, improving  #8 right-sided abdominal tenderness  Clinical improvement. Likely secondary to retroperitoneal hematoma. Abdominal ultrasound and abdominal films negative for any acute abnormalities. Abdominal ultrasound positive for hepatic steatosis. Follow. Pain management. Supportive care.  #9 atrial fibrillation  Continue beta blocker for rate control. INR has been reversed  -Patient is status post permanent pacemaker.  -09/02/2012--patient was on a diltiazem drip postoperatively.  -change diltiazem to a long-acting formulation #10 normocytic anemia  Stable. Follow H&H.  #11 type 2 diabetes  Continue sliding scale insulin.  Hyponatremia -Likely due to hypotonic fluid infusion in the last 24 hours -Discontinue D5 half-normal saline Family Communication: Wife and daughter at beside  Disposition Plan: SNF when medically stable  Antibiotics:  IV vancomycin 08/26/2012--->08/29/12  IV Rocephin 08/26/2012---. 08/27/12  IV Ancef 08/28/2012-->  Rifampin 09/01/12--> Family Communication: Wife and daughter at beside  Disposition Plan: SNF when medically stable           Procedures/Studies: Ct Abdomen Pelvis Wo Contrast  08/28/2012   *RADIOLOGY REPORT*  Clinical Data: Evaluate abscess.  CT ABDOMEN AND PELVIS WITHOUT CONTRAST  Technique:  Multidetector CT imaging of the abdomen and pelvis was performed following the standard protocol without intravenous contrast.  Comparison: CT of the right hip from the same date.  Findings:  BODY WALL: Unremarkable.  LOWER CHEST:  Mediastinum: Single chamber right ventricular pacer.  Coronary artery atherosclerosis.  Lungs/pleura: Small bilateral pleural effusions  and associated atelectasis.  ABDOMEN/PELVIS:  Liver: No focal abnormality.  Biliary: Layering stones.  No evidence of acute cholecystitis.  Pancreas: Fatty atrophy.  Spleen: Unremarkable.  Adrenals: Unremarkable.  Kidneys and ureters: There are two exophytic right renal lesions in the interpolar region.  The largest measures 2.6 cm and is compatible with cyst - confirmed by recent sonography.  The second measures 15 mm and is soft tissue density. This was not definitely seen on prior ultrasound. This lesion may have grown mildly since imaging 1September 02, 202013.  No hydronephrosis or stone.  Bladder: Mild distension by urine.  Bowel:  No definite communication between the bowel and retroperitoneal collection.  Unfortunately, enteric contrast has not yet reached the colon at time of imaging.   Normal appendix.  Retroperitoneum:Extensive right retroperitoneal hematoma gas and fluid collection, involving the lower posterior pararenal space, the right iliacus muscle, proximal right rectus femoris, iliopsoas bursa.  The largest posterior pararenal component is at the pelvic brim, measuring 9 x 4 by 6 cm.  The psoas muscle is spared.  No evidence of epidural extension of infection.  The neighboring osseous structures [name] no evidence of osteomyelitis.  Evaluation of the total right hip prosthesis was better accomplished on dedicated CT imaging. There is a relatively large collection posterior to the right femoral neck and greater trochanter, containing fluid and gas, measuring 7 x 3 x 3 cm. High density material present within the expanded right iliacus, measuring approximately 5 cm in diameter.  Peritoneum: No free fluid or gas.  Reproductive: Prostate enlargement calcifications.  Vascular: Extensive atherosclerotic calcification.  OSSEOUS: Total right hip prosthesis with surrounding fluid collections, as above.  Extensive degenerative disc disease with multilevel fusion and laminectomy.  No evidence of disc space infection. No  suspicious lytic or blastic lesions.  IMPRESSION:  1.  Extensive complex fluid collection, likely abscess, involving the lower right retroperitoneum and right periarticular hip - size and extent described above. Although there is extensive gas within the collection, no bowel communication identified.  Probable hematoma within the iliacus component.  2. Indeterminate, possibly solid 15 mm lesion in the interpolar right kidney.  Followup imaging advised.  3. Cholelithiasis.   Original Report Authenticated By: Tiburcio Pea   Dg Chest 2 View  09/01/2012   *RADIOLOGY REPORT*  Clinical Data: Interval removal of cardiac pacer apparatus.  CHEST - 2 VIEW  Comparison: Chest radiograph the 02/15/2012  Findings: Stable enlarged cardiac and mediastinal contours with calcification of the transverse thoracic aorta.  No consolidative pulmonary opacities.  No pleural effusion or pneumothorax. Mid thoracic spine degenerative change.  IMPRESSION: No acute cardiopulmonary process.   Original Report Authenticated By: Annia Belt, M.D   Dg Hip Complete Right  08/28/2012   *RADIOLOGY REPORT*  Clinical Data: 79 year old male with right hip pain.  RIGHT HIP - COMPLETE 2+ VIEW  Comparison: 11/26/2010 radiographs  Findings: Right total hip replacement again identified. There is no evidence of fracture, subluxation or dislocation. No complicating features are identified.  IMPRESSION: Right total hip replacement without acute abnormality.   Original Report Authenticated By: Harmon Pier, M.D.   Ct Lumbar Spine Wo Contrast  08/27/2012   *RADIOLOGY REPORT*  Clinical Data: Low back/right leg pain, fever, positive blood cultures  CT LUMBAR SPINE WITHOUT CONTRAST  Technique:  Multidetector CT imaging of the lumbar spine was performed without intravenous contrast administration. Multiplanar CT image reconstructions were also generated.  Comparison: CT myelogram dated 02/10/2012.  Findings: No evidence of fracture or dislocation.  Vertebral body  heights are maintained.  Postsurgical changes related to prior T12-L1 posterior laminectomy/discectomy and prior L4-S1 posterior laminectomy.  Prior L3-4 fusion.  Moderate to severe multilevel degenerative changes, unchanged from prior  CT myelogram.  This includes mild to moderate central canal stenosis of the upper lumbar spine and moderate to severe central canal stenosis of the lower lumbar spine with nerve root encroachment at multiple levels.  No evidence of epidural fluid collection/abscess.  Complex fluid collection in the right abdomen/pelvis, incompletely visualized.  IMPRESSION: Postsurgical changes involving the lower thoracolumbar spine.  Stable moderate to severe multilevel degenerative changes, unchanged from prior CT myelogram.  No evidence of epidural fluid collection/abscess.  Complex fluid collection in the right abdomen/pelvis, incompletely visualized.  Please refer to CT right hip report for further assessment.   Original Report Authenticated By: Charline Bills, M.D.   US Abdomen Complete  08/26/2012   *RADIOLOGY REPORT*  Clinical Data:  79 year old with abnormal liver function tests.  COMPLETE ABDOMINAL ULTRASOUND  Comparison:  Unenhanced CT abdomen and pelvis 1Jun 27, 202013 [place].  Abdominal ultrasound 05/21/2011 [facility].  Findings:  Gallbladder:  Small shadowing gallstones.  Patient unable to turn into the lateral decubitus position to confirm movement of the stones, but these were present on the prior ultrasound examination. No gallbladder wall thickening or pericholecystic fluid.  Negative sonographic Murphy's sign according to the ultrasound technologist.  Common bile duct:  Normal in caliber with maximum diameter approximating 3 mm.  Liver:  Diffusely increased and coarsened echotexture without focal parenchymal abnormality.  Patent portal vein with hepatopetal flow.  IVC:  Patent in its intrahepatic portion.  Obscured outside the liver by bowel gas.  Pancreas:  Obscured  by midline bowel gas and therefore not evaluated.  Spleen:  Normal size and echotexture without focal parenchymal abnormality.  Right Kidney:  No hydronephrosis.  Well-preserved cortex for age. Cysts arising from the mid and lower pole as noted previously, the cyst arising from the mid kidney approximating 2.8 cm and the cyst arising from the lower pole approximating 3.1 cm.  No solid renal masses.  Normal parenchymal echotexture.  Approximately 12.0 cm in length.  Left Kidney:  No hydronephrosis.  Well-preserved cortex for age. Normal parenchymal echotexture.  No focal parenchymal abnormality. Approximately 10.7 cm in length.  Abdominal aorta:  Obscured by midline bowel gas and therefore not evaluated.  IMPRESSION:  1.  Cholelithiasis.  No sonographic evidence of acute cholecystitis.  No biliary ductal dilation. 2.  Diffuse hepatic steatosis without focal hepatic parenchymal abnormality. 3.  Midline structures (pancreas, extrahepatic IVC, and abdominal aorta) obscured by overlying bowel gas and therefore not evaluated.   Original Report Authenticated By: Hulan Saas, M.D.   Dg Pelvis Portable  09/02/2012   *RADIOLOGY REPORT*  Clinical Data: Revision of left total hip arthroplasty.  PORTABLE PELVIS  Comparison: Pelvic CT 08/29/2012 and 08/27/2012.  Findings: Two right-sided pelvic drains appear unchanged in position.  The patient is status post right total hip arthroplasty. Surgical drains, soft tissue emphysema and skin staples are in place consistent with recent revision.  The native left hip appears normal.  Degenerative changes are present throughout the spine.  IMPRESSION: No demonstrated complication following revision of RIGHT total hip arthroplasty.   Original Report Authenticated By: Carey Bullocks, M.D.   Ct Hip Right Wo Contrast  08/27/2012   *RADIOLOGY REPORT*  Clinical Data: Low back/right leg pain, fever, positive blood cultures  CT OF THE RIGHT HIP WITHOUT CONTRAST  Technique:  Multidetector  CT imaging was performed according to the standard protocol. Multiplanar CT image reconstructions were also generated.  Comparison: CT abdomen pelvis dated 1Jun 27, 202013.  Findings: Right total hip arthroplasty.  No evidence of fracture or  dislocation.  Complex fluid collection/debris involving the right hip (series 5/image 36), right iliopsoas/iliacus muscles (series 5/images 9 and 23), and the visualized right lower retroperitoneum (series 5/image 1).  Representative axial measurements include: --3.8 x 8.3 cm in the right retroperitoneum (series 5/image 5) --9.2 x 5.6 cm in the right iliacus muscle (series 5/image 13) --12.7 x 7.3 cm along the right hip (series 5/image 37)  Based on this study, it is unclear whether this reflects a right hip infection which secondarily involves the lower abdomen/pelvis or whether this originated in the lower abdomen/pelvis and now extends to the right hip.  IMPRESSION: Right total hip arthroplasty.  Complex fluid collection/debris involving the right hip, right iliopsoas/iliacus muscles, and visualized right lower retroperitoneum, as described above.  Based on this study, it is unclear whether this reflects a right hip infection which secondarily involves the lower abdomen/pelvis (favored) or whether this originated in the lower abdomen/pelvis and now extends to the right hip.  Consider CT abdomen/pelvis (preferably with half dose contrast) for further evaluation.  These results were called by telephone on 08/27/2012 at 2000 hrs to Dr. Tressie Stalker, who verbally acknowledged these results.   Original Report Authenticated By: Charline Bills, M.D.   Dg Chest Port 1 View  08/26/2012   *RADIOLOGY REPORT*  Clinical Data: Elevated white blood count  PORTABLE CHEST - 1 VIEW  Comparison: November 11, 2010.  Findings: Stable cardiomediastinal silhouette.  Left-sided pacemaker is unchanged in position.  No acute pulmonary disease is noted.  No pneumothorax or pleural effusion is noted.   Degenerative joint disease is seen involving both shoulders.  IMPRESSION: No acute cardiopulmonary abnormality seen.   Original Report Authenticated By: Lupita Raider.,  M.D.   Dg Abd Portable 2v  08/26/2012   *RADIOLOGY REPORT*  Clinical Data: Abdominal pain.  PORTABLE ABDOMEN - 2 VIEW  Comparison: [facility] images for CT scan of the lumbar spine dated 02/10/2012  Findings: There is no free air in the abdomen.  There is air scattered throughout nondistended loops of large and small bowel. No excessive stool in the colon.  Diffuse degenerative changes of the lumbar spine.  Right hip prosthesis.  IMPRESSION: No acute abnormalities.   Original Report Authenticated By: Francene Boyers, M.D.   Ct Image Guided Fluid Drain By Catheter  08/29/2012   *RADIOLOGY REPORT*  Clinical Data: Right-sided retroperitoneal fluid collections containing air.  CT GUIDED DRAINAGE OF RETROPERITONEAL ABSCESS x 2  Sedation:  2.0 mg IV Versed;  50 mcg IV Fentanyl  Total Moderate Sedation Time: 36 minutes.  Procedure:  The procedure, risks, benefits, and alternatives were explained to the patient.  Questions regarding the procedure were encouraged and answered. The patient understands and consents to the procedure.  The right lateral abdominal wall was prepped with Betadine in a sterile fashion, and a sterile drape was applied covering the operative field.  A sterile gown and sterile gloves were used for the procedure. Local anesthesia was provided with 1% Lidocaine.  CT was performed in a supine position with the right side rolled up slightly.  Under CT guidance, two separate 18 gauge trocar needles were advanced into both anterior and posterior retroperitoneal fluid collections on the right.  Fluid aspiration was performed and a fluid sample sent for culture analysis.  Guide wires were advanced into the two dominant pockets.  Tracts were dilated and 12-French percutaneous drainage catheters placed. Catheter position was confirmed by CT.  The  catheters were flushed with saline and connected to suction  bulbs.  Both catheters were secured at the skin with Prolene retention sutures and adhesive Stat-Lock devices.  Complications: None  Findings: It did appear that the dominant retroperitoneal fluid collections do not appear to [name] good communication with two dominant pockets identified.  Aspiration at the level of the anterior dominant pocket revealed liquefied blood which was not grossly turbid.  Aspiration of the dominant posterior retroperitoneal collection in the iliac fossa yielded dark partially liquefied blood.  12-French drains were placed in both collections.  The more anterior collection shows much more significant initial return of liquefied blood.  The posterior collection is slowly draining after flushing the drainage catheter.  IMPRESSION: Both anterior and posterior dominant right retroperitoneal collections were aspirated and treated with placement of indwelling 12-French drains.  The more anterior collection demonstrated free return of liquefied blood.  The more posterior collection yielded much darker old blood product with slow return.  Both drains were connected to suction bulbs and will be flushed every 8 hours.   Original Report Authenticated By: Irish Lack, M.D.   Ct Image Guided Fluid Drain By Catheter  08/29/2012   *RADIOLOGY REPORT*  Clinical Data: Right-sided retroperitoneal fluid collections containing air.  CT GUIDED DRAINAGE OF RETROPERITONEAL ABSCESS x 2  Sedation:  2.0 mg IV Versed;  50 mcg IV Fentanyl  Total Moderate Sedation Time: 36 minutes.  Procedure:  The procedure, risks, benefits, and alternatives were explained to the patient.  Questions regarding the procedure were encouraged and answered. The patient understands and consents to the procedure.  The right lateral abdominal wall was prepped with Betadine in a sterile fashion, and a sterile drape was applied covering the operative field.  A sterile gown and  sterile gloves were used for the procedure. Local anesthesia was provided with 1% Lidocaine.  CT was performed in a supine position with the right side rolled up slightly.  Under CT guidance, two separate 18 gauge trocar needles were advanced into both anterior and posterior retroperitoneal fluid collections on the right.  Fluid aspiration was performed and a fluid sample sent for culture analysis.  Guide wires were advanced into the two dominant pockets.  Tracts were dilated and 12-French percutaneous drainage catheters placed. Catheter position was confirmed by CT.  The catheters were flushed with saline and connected to suction bulbs.  Both catheters were secured at the skin with Prolene retention sutures and adhesive Stat-Lock devices.  Complications: None  Findings: It did appear that the dominant retroperitoneal fluid collections do not appear to [name] good communication with two dominant pockets identified.  Aspiration at the level of the anterior dominant pocket revealed liquefied blood which was not grossly turbid.  Aspiration of the dominant posterior retroperitoneal collection in the iliac fossa yielded dark partially liquefied blood.  12-French drains were placed in both collections.  The more anterior collection shows much more significant initial return of liquefied blood.  The posterior collection is slowly draining after flushing the drainage catheter.  IMPRESSION: Both anterior and posterior dominant right retroperitoneal collections were aspirated and treated with placement of indwelling 12-French drains.  The more anterior collection demonstrated free return of liquefied blood.  The more posterior collection yielded much darker old blood product with slow return.  Both drains were connected to suction bulbs and will be flushed every 8 hours.   Original Report Authenticated By: Irish Lack, M.D.         Subjective: Patient is doing well. Denies any fevers, chills, chest pain, shortness  breath, nausea,  vomiting, diarrhea,, pain. Right hip pain is controlled with hydromorphone.  Objective: Filed Vitals:   09/03/12 1916 09/03/12 2340 09/04/12 0350 09/04/12 0736  BP: 137/60 159/75 156/77 128/65  Pulse: 72 88 112 96  Temp: 98.6 F (37 C) 98.8 F (37.1 C) 98.9 F (37.2 C) 98.8 F (37.1 C)  TempSrc: Oral Oral Oral Oral  Resp: 12 19 21 12   Height:      Weight:      SpO2: 99% 95% 96% 98%    Intake/Output Summary (Last 24 hours) at 09/04/12 0849 Last data filed at 09/04/12 1610  Gross per 24 hour  Intake   1940 ml  Output   2035 ml  Net    -95 ml   Weight change:  Exam:   General:  Pt is alert, follows commands appropriately, not in acute distress  HEENT: No icterus, No thrush,  [place]/AT  Cardiovascular: RRR, S1/S2, no rubs, no gallops  Respiratory: CTA bilaterally, no wheezing, no crackles, no rhonchi  Abdomen: Soft/+BS, non tender, non distended, no guarding  Extremities: No edema, No lymphangitis, No petechiae, No rashes, no synovitis  Data Reviewed: Basic Metabolic Panel:  Recent Labs Lab 08/30/12 0600 09/01/12 0620 09/02/12 0540 09/03/12 0530 09/04/12 0440  NA 136 135 132* 132* 129*  K 3.7 3.8 3.8 4.6 4.0  CL 94* 95* 94* 97 92*  CO2 32 31 28 28 31   GLUCOSE 140* 100* 86 141* 196*  BUN 28* 21 19 20 16   CREATININE 1.28 1.16 1.15 1.41* 1.27  CALCIUM 8.8 9.0 9.0 8.5 8.5   Liver Function Tests: No results found for this basename: AST, ALT, ALKPHOS, BILITOT, PROT, ALBUMIN,  in the last 168 hours No results found for this basename: LIPASE, AMYLASE,  in the last 168 hours No results found for this basename: AMMONIA,  in the last 168 hours CBC:  Recent Labs Lab 08/29/12 0540 08/30/12 0600 09/01/12 0620 09/02/12 0540 09/03/12 0530 09/04/12 0440  WBC 15.4* 13.2* 14.1* 12.5* 15.6* 15.0*  NEUTROABS 13.4* 11.2*  --   --   --   --   HGB 10.2* 10.3* 10.6* 10.5* 10.8* 10.9*  HCT 30.9* 31.3* 31.9* 32.1* 31.7* 31.5*  MCV 90.4 89.9 89.9 89.7 88.1  88.0  PLT 246 252 292 310 303 305   Cardiac Enzymes: No results found for this basename: CKTOTAL, CKMB, CKMBINDEX, TROPONINI,  in the last 168 hours BNP: No components found with this basename: POCBNP,  CBG:  Recent Labs Lab 09/02/12 2236 09/03/12 0748 09/03/12 1111 09/03/12 1546 09/03/12 2148  GLUCAP 96 152* 164* 163* 181*    Recent Results (from the past 240 hour(s))  URINE CULTURE     Status: None   Collection Time    08/26/12  7:32 AM      Result Value Range Status   Specimen Description URINE, CLEAN CATCH   Final   Special Requests NONE   Final   Culture  Setup Time 08/26/2012 21:40   Final   Colony Count >=100,000 COLONIES/ML   Final   Culture KLEBSIELLA OZAENAE   Final   Report Status 08/29/2012 FINAL   Final   Organism ID, Bacteria KLEBSIELLA OZAENAE   Final  CULTURE, BLOOD (ROUTINE X 2)     Status: None   Collection Time    08/26/12 11:20 AM      Result Value Range Status   Specimen Description BLOOD LEFT ARM   Final   Special Requests BOTTLES DRAWN AEROBIC AND ANAEROBIC 10CC  Final   Culture  Setup Time 08/26/2012 16:54   Final   Culture     Final   Value: STAPHYLOCOCCUS AUREUS     Note: SUSCEPTIBILITIES PERFORMED ON PREVIOUS CULTURE WITHIN THE LAST 5 DAYS.     2 Note: Gram Stain Report Called to,Read Back By and Verified With: KELSEY [place] RN ON8 14 @420  EDMOJ   Report Status 08/29/2012 FINAL   Final  CULTURE, BLOOD (ROUTINE X 2)     Status: None   Collection Time    08/26/12 11:30 AM      Result Value Range Status   Specimen Description BLOOD LEFT HAND   Final   Special Requests     Final   Value: BOTTLES DRAWN AEROBIC AND ANAEROBIC AERO 10CC ANA 5CC   Culture  Setup Time 08/26/2012 16:54   Final   Culture     Final   Value: STAPHYLOCOCCUS AUREUS     Note: RIFAMPIN AND GENTAMICIN SHOULD NOT BE USED AS SINGLE DRUGS FOR TREATMENT OF STAPH INFECTIONS.     2 Note: Gram Stain Report Called to,Read Back By and Verified With: KELSEY [place] RN ON8 14 @420   EDMOJ   Report Status 08/29/2012 FINAL   Final   Organism ID, Bacteria STAPHYLOCOCCUS AUREUS   Final  CULTURE, BLOOD (ROUTINE X 2)     Status: None   Collection Time    08/28/12 12:45 AM      Result Value Range Status   Specimen Description BLOOD RIGHT HAND   Final   Special Requests BOTTLES DRAWN AEROBIC AND ANAEROBIC 10CC   Final   Culture  Setup Time     Final   Value: 08/28/2012 15:05     Performed at [facility]   Culture     Final   Value: NO GROWTH 5 DAYS     Performed at [facility]   Report Status 09/03/2012 FINAL   Final  CULTURE, BLOOD (ROUTINE X 2)     Status: None   Collection Time    08/28/12 12:55 AM      Result Value Range Status   Specimen Description BLOOD RIGHT HAND   Final   Special Requests BOTTLES DRAWN AEROBIC ONLY 10CC   Final   Culture  Setup Time     Final   Value: 08/28/2012 15:05     Performed at [facility]   Culture     Final   Value: NO GROWTH 5 DAYS     Performed at [facility]   Report Status 09/03/2012 FINAL   Final  CULTURE, ROUTINE-ABSCESS     Status: None   Collection Time    08/29/12  3:01 PM      Result Value Range Status   Specimen Description ABSCESS   Final   Special Requests RIGHT RETROPERITONEAL HEMATOMA   Final   Gram Stain     Final   Value: MODERATE WBC PRESENT, PREDOMINANTLY PMN     NO SQUAMOUS EPITHELIAL CELLS SEEN     MODERATE GRAM POSITIVE COCCI IN CLUSTERS     Performed at [facility]   Culture     Final   Value: ABUNDANT STAPHYLOCOCCUS AUREUS     Note: RIFAMPIN AND GENTAMICIN SHOULD NOT BE USED AS SINGLE DRUGS FOR TREATMENT OF STAPH INFECTIONS.     Performed at [facility]   Report Status 09/01/2012 FINAL   Final   Organism ID, Bacteria STAPHYLOCOCCUS AUREUS   Final  ANAEROBIC  CULTURE     Status: None   Collection Time    08/29/12  3:01 PM      Result Value Range Status   Specimen Description ABSCESS   Final   Special Requests RIGHT RETROPERITONEAL HEMATOMA    Final   Gram Stain     Final   Value: MODERATE WBC PRESENT, PREDOMINANTLY PMN     NO SQUAMOUS EPITHELIAL CELLS SEEN     M0 GRAM POSITIVE COCCI     IN PAIRS INCISION     Performed at [facility]   Culture     Final   Value: NO ANAEROBES ISOLATED     Performed at [facility]   Report Status 09/03/2012 FINAL   Final  SURGICAL PCR SCREEN     Status: None   Collection Time    08/31/12  7:00 AM      Result Value Range Status   MRSA, PCR NEGATIVE  NEGATIVE Final   Staphylococcus aureus NEGATIVE  NEGATIVE Final   Comment:            The Xpert SA Assay (FDA     approved for NASAL specimens     in patients over 69 years of age),     is one component of     a comprehensive surveillance     program.  Test performance has     been validated by [employer] for patients greater     than or equal to 80 year old.     It is not intended     to diagnose infection nor to     guide or monitor treatment.  CULTURE, BLOOD (ROUTINE X 2)     Status: None   Collection Time    09/01/12  6:30 AM      Result Value Range Status   Specimen Description BLOOD RIGHT HAND   Final   Special Requests     Final   Value: BOTTLES DRAWN AEROBIC AND ANAEROBIC 10CC AEROBIC 6CC ANAEROBIC   Culture  Setup Time     Final   Value: 09/01/2012 11:05     Performed at [facility]   Culture     Final   Value:        BLOOD CULTURE RECEIVED NO GROWTH TO DATE CULTURE WILL BE HELD FOR 5 DAYS BEFORE ISSUING A FINAL NEGATIVE REPORT     Performed at [facility]   Report Status PENDING   Incomplete  CULTURE, BLOOD (ROUTINE X 2)     Status: None   Collection Time    09/01/12  6:35 AM      Result Value Range Status   Specimen Description BLOOD LEFT HAND   Final   Special Requests BOTTLES DRAWN AEROBIC ONLY 10CC   Final   Culture  Setup Time     Final   Value: 09/01/2012 11:05     Performed at [facility]   Culture     Final   Value:        BLOOD CULTURE RECEIVED NO GROWTH TO  DATE CULTURE WILL BE HELD FOR 5 DAYS BEFORE ISSUING A FINAL NEGATIVE REPORT     Performed at [facility]   Report Status PENDING   Incomplete  ANAEROBIC CULTURE     Status: None   Collection Time    09/01/12  9:08 AM      Result Value Range Status   Specimen Description ABSCESS  HIP RIGHT   Final   Special Requests RIGHT HIP MUSCULAR ABSCESS   Final   Gram Stain     Final   Value: ABUNDANT WBC PRESENT,BOTH PMN AND MONONUCLEAR     MODERATE GRAM POSITIVE COCCI     IN PAIRS IN CLUSTERS Performed at [facility]     Performed at [facility]   Culture     Final   Value: NO ANAEROBES ISOLATED; CULTURE IN PROGRESS FOR 5 DAYS     Performed at [facility]   Report Status PENDING   Incomplete  GRAM STAIN     Status: None   Collection Time    09/01/12  9:08 AM      Result Value Range Status   Specimen Description ABSCESS HIP RIGHT   Final   Special Requests RIGHT HIP MUSCULAR ABSCESS   Final   Gram Stain     Final   Value: ABUNDANT WBC PRESENT,BOTH PMN AND MONONUCLEAR     MODERATE GRAM POSITIVE COCCI IN PAIRS IN CLUSTERS     CALLED TO A RODRIGUEZ,RN 09/01/12 0955 BY K SCHULTZ   Report Status 09/01/2012 FINAL   Final  CULTURE, ROUTINE-ABSCESS     Status: None   Collection Time    09/01/12  9:08 AM      Result Value Range Status   Specimen Description ABSCESS HIP RIGHT   Final   Special Requests RIGHT HIP MUSCULAR ABSCESS   Final   Gram Stain     Final   Value: ABUNDANT WBC PRESENT,BOTH PMN AND MONONUCLEAR     MODERATE GRAM POSITIVE COCCI     IN PAIRS IN CLUSTERS Performed at [facility]     Performed at [facility]   Culture     Final   Value: FEW STAPHYLOCOCCUS AUREUS     Note: RIFAMPIN AND GENTAMICIN SHOULD NOT BE USED AS SINGLE DRUGS FOR TREATMENT OF STAPH INFECTIONS.     Performed at [facility]   Report Status 09/04/2012 FINAL   Final   Organism ID, Bacteria STAPHYLOCOCCUS AUREUS   Final  TISSUE CULTURE     Status: None    Collection Time    09/02/12  2:15 PM      Result Value Range Status   Specimen Description TISSUE RIGHT LEG   Final   Special Requests POF ANCEF POSTERIOR HIP CAPSULE   Final   Gram Stain     Final   Value: NO WBC SEEN     NO ORGANISMS SEEN     Performed at [facility]   Culture     Final   Value: NO GROWTH 1 DAY     Performed at [facility]   Report Status PENDING   Incomplete     Scheduled Meds: . aspirin  325 mg Oral Daily  .  ceFAZolin (ANCEF) IV  1 g Intravenous Q8H  . diltiazem  30 mg Oral Q6H  . docusate sodium  100 mg Oral BID  . fentaNYL  50-100 mcg Intravenous Once  . finasteride  5 mg Oral Daily  . insulin aspart  0-9 Units Subcutaneous TID WC  . levothyroxine  75 mcg Oral QAC breakfast  . metolazone  2.5 mg Oral Q M,W,F  . metoprolol succinate  12.5 mg Oral Daily  . potassium chloride  40 mEq Oral Daily  . rifampin  300 mg Oral Q12H   Continuous Infusions:    Dajuan Turnley, DO  [facility] Pager [phone]  If 7PM-7AM, please contact night-coverage [email or web address] Password [email or web address] 09/04/2012, 8:49 AM   LOS: 10 days

## 2012-09-04 NOTE — Progress Notes (Signed)
Pt seen yesterday 8/9  Subjective: Pt went to OR for radical irrigation and debridement of right total hip. Now in Step down unit but feel ok.  Objective: Vital signs in last 24 hours: Temp:  [98.5 F (36.9 C)-98.9 F (37.2 C)] 98.9 F (37.2 C) (08/10 0350) Pulse Rate:  [72-128] 112 (08/10 0350) Resp:  [12-21] 21 (08/10 0350) BP: (122-159)/(60-99) 156/77 mmHg (08/10 0350) SpO2:  [95 %-99 %] 96 % (08/10 0350) Last BM Date: 08/31/12   Rt RP drains intact, insertion sites ok;  Drain #1 output has diminished, only 20-30cc per day past 2 days Drain #2 output has remained steady with at least 80cc per day.  Lab Results:   Recent Labs  09/03/12 0530 09/04/12 0440  WBC 15.6* 15.0*  HGB 10.8* 10.9*  HCT 31.7* 31.5*  PLT 303 305   BMET  Recent Labs  09/03/12 0530 09/04/12 0440  NA 132* 129*  K 4.6 4.0  CL 97 92*  CO2 28 31  GLUCOSE 141* 196*  BUN 20 16  CREATININE 1.41* 1.27  CALCIUM 8.5 8.5   PT/INR  Recent Labs  09/03/12 0530 09/04/12 0440  LABPROT 16.1* 15.8*  INR 1.32 1.29   ABG No results found for this basename: PHART, PCO2, PO2, HCO3,  in the last 72 hours  Studies/Results: Dg Pelvis Portable  09/02/2012   *RADIOLOGY REPORT*  Clinical Data: Revision of left total hip arthroplasty.  PORTABLE PELVIS  Comparison: Pelvic CT 08/29/2012 and 08/27/2012.  Findings: Two right-sided pelvic drains appear unchanged in position.  The patient is status post right total hip arthroplasty. Surgical drains, soft tissue emphysema and skin staples are in place consistent with recent revision.  The native left hip appears normal.  Degenerative changes are present throughout the spine.  IMPRESSION: No demonstrated complication following revision of RIGHT total hip arthroplasty.   Original Report Authenticated By: Carey Bullocks, M.D.   Results for orders placed during the hospital encounter of 08/25/12  URINE CULTURE     Status: None   Collection Time    08/26/12  7:32 AM      Result Value Range Status   Specimen Description URINE, CLEAN CATCH   Final   Special Requests NONE   Final   Culture  Setup Time 08/26/2012 21:40   Final   Colony Count >=100,000 COLONIES/ML   Final   Culture KLEBSIELLA OZAENAE   Final   Report Status 08/29/2012 FINAL   Final   Organism ID, Bacteria KLEBSIELLA OZAENAE   Final  CULTURE, BLOOD (ROUTINE X 2)     Status: None   Collection Time    08/26/12 11:20 AM      Result Value Range Status   Specimen Description BLOOD LEFT ARM   Final   Special Requests BOTTLES DRAWN AEROBIC AND ANAEROBIC 10CC   Final   Culture  Setup Time 08/26/2012 16:54   Final   Culture     Final   Value: STAPHYLOCOCCUS AUREUS     Note: SUSCEPTIBILITIES PERFORMED ON PREVIOUS CULTURE WITHIN THE LAST 5 DAYS.     2 Note: Gram Stain Report Called to,Read Back By and Verified With: KELSEY [place] RN ON8 14 @420  EDMOJ   Report Status 08/29/2012 FINAL   Final  CULTURE, BLOOD (ROUTINE X 2)     Status: None   Collection Time    08/26/12 11:30 AM      Result Value Range Status   Specimen Description BLOOD LEFT HAND   Final  Special Requests     Final   Value: BOTTLES DRAWN AEROBIC AND ANAEROBIC AERO 10CC ANA 5CC   Culture  Setup Time 08/26/2012 16:54   Final   Culture     Final   Value: STAPHYLOCOCCUS AUREUS     Note: RIFAMPIN AND GENTAMICIN SHOULD NOT BE USED AS SINGLE DRUGS FOR TREATMENT OF STAPH INFECTIONS.     2 Note: Gram Stain Report Called to,Read Back By and Verified With: KELSEY [place] RN ON8 14 @420  EDMOJ   Report Status 08/29/2012 FINAL   Final   Organism ID, Bacteria STAPHYLOCOCCUS AUREUS   Final  CULTURE, BLOOD (ROUTINE X 2)     Status: None   Collection Time    08/28/12 12:45 AM      Result Value Range Status   Specimen Description BLOOD RIGHT HAND   Final   Special Requests BOTTLES DRAWN AEROBIC AND ANAEROBIC 10CC   Final   Culture  Setup Time     Final   Value: 08/28/2012 15:05     Performed at [facility]   Culture     Final    Value: NO GROWTH 5 DAYS     Performed at [facility]   Report Status 09/03/2012 FINAL   Final  CULTURE, BLOOD (ROUTINE X 2)     Status: None   Collection Time    08/28/12 12:55 AM      Result Value Range Status   Specimen Description BLOOD RIGHT HAND   Final   Special Requests BOTTLES DRAWN AEROBIC ONLY 10CC   Final   Culture  Setup Time     Final   Value: 08/28/2012 15:05     Performed at [facility]   Culture     Final   Value: NO GROWTH 5 DAYS     Performed at [facility]   Report Status 09/03/2012 FINAL   Final  CULTURE, ROUTINE-ABSCESS     Status: None   Collection Time    08/29/12  3:01 PM      Result Value Range Status   Specimen Description ABSCESS   Final   Special Requests RIGHT RETROPERITONEAL HEMATOMA   Final   Gram Stain     Final   Value: MODERATE WBC PRESENT, PREDOMINANTLY PMN     NO SQUAMOUS EPITHELIAL CELLS SEEN     MODERATE GRAM POSITIVE COCCI IN CLUSTERS     Performed at [facility]   Culture     Final   Value: ABUNDANT STAPHYLOCOCCUS AUREUS     Note: RIFAMPIN AND GENTAMICIN SHOULD NOT BE USED AS SINGLE DRUGS FOR TREATMENT OF STAPH INFECTIONS.     Performed at [facility]   Report Status 09/01/2012 FINAL   Final   Organism ID, Bacteria STAPHYLOCOCCUS AUREUS   Final  ANAEROBIC CULTURE     Status: None   Collection Time    08/29/12  3:01 PM      Result Value Range Status   Specimen Description ABSCESS   Final   Special Requests RIGHT RETROPERITONEAL HEMATOMA   Final   Gram Stain     Final   Value: MODERATE WBC PRESENT, PREDOMINANTLY PMN     NO SQUAMOUS EPITHELIAL CELLS SEEN     M0 GRAM POSITIVE COCCI     IN PAIRS INCISION     Performed at [facility]   Culture     Final   Value: NO ANAEROBES ISOLATED     Performed at  [facility]   Report Status 09/03/2012 FINAL   Final  SURGICAL PCR SCREEN     Status: None   Collection Time    08/31/12  7:00 AM      Result Value Range Status   MRSA,  PCR NEGATIVE  NEGATIVE Final   Staphylococcus aureus NEGATIVE  NEGATIVE Final   Comment:            The Xpert SA Assay (FDA     approved for NASAL specimens     in patients over 66 years of age),     is one component of     a comprehensive surveillance     program.  Test performance has     been validated by [employer] for patients greater     than or equal to 75 year old.     It is not intended     to diagnose infection nor to     guide or monitor treatment.  CULTURE, BLOOD (ROUTINE X 2)     Status: None   Collection Time    09/01/12  6:30 AM      Result Value Range Status   Specimen Description BLOOD RIGHT HAND   Final   Special Requests     Final   Value: BOTTLES DRAWN AEROBIC AND ANAEROBIC 10CC AEROBIC 6CC ANAEROBIC   Culture  Setup Time     Final   Value: 09/01/2012 11:05     Performed at [facility]   Culture     Final   Value:        BLOOD CULTURE RECEIVED NO GROWTH TO DATE CULTURE WILL BE HELD FOR 5 DAYS BEFORE ISSUING A FINAL NEGATIVE REPORT     Performed at [facility]   Report Status PENDING   Incomplete  CULTURE, BLOOD (ROUTINE X 2)     Status: None   Collection Time    09/01/12  6:35 AM      Result Value Range Status   Specimen Description BLOOD LEFT HAND   Final   Special Requests BOTTLES DRAWN AEROBIC ONLY 10CC   Final   Culture  Setup Time     Final   Value: 09/01/2012 11:05     Performed at [facility]   Culture     Final   Value:        BLOOD CULTURE RECEIVED NO GROWTH TO DATE CULTURE WILL BE HELD FOR 5 DAYS BEFORE ISSUING A FINAL NEGATIVE REPORT     Performed at [facility]   Report Status PENDING   Incomplete  ANAEROBIC CULTURE     Status: None   Collection Time    09/01/12  9:08 AM      Result Value Range Status   Specimen Description ABSCESS HIP RIGHT   Final   Special Requests RIGHT HIP MUSCULAR ABSCESS   Final   Gram Stain     Final   Value: ABUNDANT WBC PRESENT,BOTH PMN AND MONONUCLEAR     MODERATE  GRAM POSITIVE COCCI     IN PAIRS IN CLUSTERS Performed at [facility]     Performed at [facility]   Culture     Final   Value: NO ANAEROBES ISOLATED; CULTURE IN PROGRESS FOR 5 DAYS     Performed at [facility]   Report Status PENDING   Incomplete  GRAM STAIN     Status: None   Collection Time  09/01/12  9:08 AM      Result Value Range Status   Specimen Description ABSCESS HIP RIGHT   Final   Special Requests RIGHT HIP MUSCULAR ABSCESS   Final   Gram Stain     Final   Value: ABUNDANT WBC PRESENT,BOTH PMN AND MONONUCLEAR     MODERATE GRAM POSITIVE COCCI IN PAIRS IN CLUSTERS     CALLED TO A RODRIGUEZ,RN 09/01/12 0955 BY K SCHULTZ   Report Status 09/01/2012 FINAL   Final  CULTURE, ROUTINE-ABSCESS     Status: None   Collection Time    09/01/12  9:08 AM      Result Value Range Status   Specimen Description ABSCESS HIP RIGHT   Final   Special Requests RIGHT HIP MUSCULAR ABSCESS   Final   Gram Stain     Final   Value: ABUNDANT WBC PRESENT,BOTH PMN AND MONONUCLEAR     MODERATE GRAM POSITIVE COCCI     IN PAIRS IN CLUSTERS Performed at [facility]     Performed at [facility]   Culture     Final   Value: FEW STAPHYLOCOCCUS AUREUS     Note: RIFAMPIN AND GENTAMICIN SHOULD NOT BE USED AS SINGLE DRUGS FOR TREATMENT OF STAPH INFECTIONS.     Performed at [facility]   Report Status PENDING   Incomplete  TISSUE CULTURE     Status: None   Collection Time    09/02/12  2:15 PM      Result Value Range Status   Specimen Description TISSUE RIGHT LEG   Final   Special Requests POF ANCEF POSTERIOR HIP CAPSULE   Final   Gram Stain     Final   Value: NO WBC SEEN     NO ORGANISMS SEEN     Performed at [facility]   Culture     Final   Value: NO GROWTH     Performed at [facility]   Report Status PENDING   Incomplete    Anti-infectives: Anti-infectives   Start     Dose/Rate Route Frequency Ordered Stop   09/02/12 1400   ceFAZolin (ANCEF) IVPB 1 g/50 mL premix  Status:  Discontinued     1 g 100 mL/hr over 30 Minutes Intravenous To Surgery 09/02/12 1358 09/02/12 2121   09/01/12 1000  rifampin (RIFADIN) capsule 300 mg     300 mg Oral Every 12 hours 08/31/12 2038     08/31/12 0930  gentamicin (GARAMYCIN) 80 mg in sodium chloride irrigation 0.9 % 500 mL irrigation  Status:  Discontinued       As needed 08/31/12 0931 08/31/12 1004   08/31/12 0630  gentamicin (GARAMYCIN) 80 mg in sodium chloride irrigation 0.9 % 500 mL irrigation  Status:  Discontinued     80 mg Irrigation On call 08/31/12 0627 08/31/12 1149   08/31/12 0630  ceFAZolin (ANCEF) IVPB 2 g/50 mL premix     2 g 100 mL/hr over 30 Minutes Intravenous On call 08/31/12 0627 08/31/12 0913   08/28/12 1400  ceFAZolin (ANCEF) IVPB 1 g/50 mL premix     1 g 100 mL/hr over 30 Minutes Intravenous 3 times per day 08/28/12 1136     08/28/12 0400  vancomycin (VANCOCIN) 1,500 mg in sodium chloride 0.9 % 500 mL IVPB  Status:  Discontinued     1,500 mg 250 mL/hr over 120 Minutes Intravenous Every 24 hours 08/27/12 0431 08/29/12 1425   08/27/12 0445  vancomycin (  VANCOCIN) 2,000 mg in sodium chloride 0.9 % 500 mL IVPB     2,000 mg 250 mL/hr over 120 Minutes Intravenous  Once 08/27/12 0431 08/27/12 0722   08/26/12 1000  cefTRIAXone (ROCEPHIN) 1 g in dextrose 5 % 50 mL IVPB  Status:  Discontinued     1 g 100 mL/hr over 30 Minutes Intravenous Every 24 hours 08/26/12 0913 08/27/12 1610      Assessment/Plan: s/p rt RP hematoma drainages 8/4; cont current tx, drain irrigations Could repeat CT tomorrow(1 week interval) to assess adequacy of drainage IR continuing to follow   LOS: 10 days    Brayton El 09/04/2012

## 2012-09-04 NOTE — Progress Notes (Signed)
ANTIBIOTIC CONSULT NOTE - FOLLOW UP  Pharmacy Consult for Cefazolin Indication: MSSA Bacteremia, RP abscess with MSSA, Klebsiella UTI, R-hip abscess with MSSA  No Known Allergies  Patient Measurements: Height: 5' 11.5" (181.6 cm) Weight: 244 lb 1.6 oz (110.723 kg) IBW/kg (Calculated) : 76.45  Vital Signs: Temp: 98.8 F (37.1 C) (08/10 0736) Temp src: Oral (08/10 0736) BP: 128/65 mmHg (08/10 0736) Pulse Rate: 96 (08/10 0736) Intake/Output from previous day: 08/09 0701 - 08/10 0700 In: 2080 [P.O.:840; I.V.:1200] Out: 2375 [Urine:1975; Drains:400] Intake/Output from this shift: Total I/O In: -  Out: 220 [Urine:200; Drains:20]  Labs:  Recent Labs  09/02/12 0540 09/03/12 0530 09/04/12 0440  WBC 12.5* 15.6* 15.0*  HGB 10.5* 10.8* 10.9*  PLT 310 303 305  CREATININE 1.15 1.41* 1.27   Estimated Creatinine Clearance: 59.2 ml/min (by C-G formula based on Cr of 1.27). No results found for this basename: VANCOTROUGH, Leodis Binet, VANCORANDOM, GENTTROUGH, GENTPEAK, GENTRANDOM, TOBRATROUGH, TOBRAPEAK, TOBRARND, AMIKACINPEAK, AMIKACINTROU, AMIKACIN,  in the last 72 hours   Microbiology: Recent Results (from the past 720 hour(s))  URINE CULTURE     Status: None   Collection Time    08/26/12  7:32 AM      Result Value Range Status   Specimen Description URINE, CLEAN CATCH   Final   Special Requests NONE   Final   Culture  Setup Time 08/26/2012 21:40   Final   Colony Count >=100,000 COLONIES/ML   Final   Culture KLEBSIELLA OZAENAE   Final   Report Status 08/29/2012 FINAL   Final   Organism ID, Bacteria KLEBSIELLA OZAENAE   Final  CULTURE, BLOOD (ROUTINE X 2)     Status: None   Collection Time    08/26/12 11:20 AM      Result Value Range Status   Specimen Description BLOOD LEFT ARM   Final   Special Requests BOTTLES DRAWN AEROBIC AND ANAEROBIC 10CC   Final   Culture  Setup Time 08/26/2012 16:54   Final   Culture     Final   Value: STAPHYLOCOCCUS AUREUS     Note:  SUSCEPTIBILITIES PERFORMED ON PREVIOUS CULTURE WITHIN THE LAST 5 DAYS.     2 Note: Gram Stain Report Called to,Read Back By and Verified With: KELSEY [place] RN ON8 14 @420  EDMOJ   Report Status 08/29/2012 FINAL   Final  CULTURE, BLOOD (ROUTINE X 2)     Status: None   Collection Time    08/26/12 11:30 AM      Result Value Range Status   Specimen Description BLOOD LEFT HAND   Final   Special Requests     Final   Value: BOTTLES DRAWN AEROBIC AND ANAEROBIC AERO 10CC ANA 5CC   Culture  Setup Time 08/26/2012 16:54   Final   Culture     Final   Value: STAPHYLOCOCCUS AUREUS     Note: RIFAMPIN AND GENTAMICIN SHOULD NOT BE USED AS SINGLE DRUGS FOR TREATMENT OF STAPH INFECTIONS.     2 Note: Gram Stain Report Called to,Read Back By and Verified With: KELSEY [place] RN ON8 14 @420  EDMOJ   Report Status 08/29/2012 FINAL   Final   Organism ID, Bacteria STAPHYLOCOCCUS AUREUS   Final  CULTURE, BLOOD (ROUTINE X 2)     Status: None   Collection Time    08/28/12 12:45 AM      Result Value Range Status   Specimen Description BLOOD RIGHT HAND   Final   Special Requests BOTTLES DRAWN AEROBIC AND  ANAEROBIC 10CC   Final   Culture  Setup Time     Final   Value: 08/28/2012 15:05     Performed at [facility]   Culture     Final   Value: NO GROWTH 5 DAYS     Performed at [facility]   Report Status 09/03/2012 FINAL   Final  CULTURE, BLOOD (ROUTINE X 2)     Status: None   Collection Time    08/28/12 12:55 AM      Result Value Range Status   Specimen Description BLOOD RIGHT HAND   Final   Special Requests BOTTLES DRAWN AEROBIC ONLY 10CC   Final   Culture  Setup Time     Final   Value: 08/28/2012 15:05     Performed at [facility]   Culture     Final   Value: NO GROWTH 5 DAYS     Performed at [facility]   Report Status 09/03/2012 FINAL   Final  CULTURE, ROUTINE-ABSCESS     Status: None   Collection Time    08/29/12  3:01 PM      Result Value Range Status    Specimen Description ABSCESS   Final   Special Requests RIGHT RETROPERITONEAL HEMATOMA   Final   Gram Stain     Final   Value: MODERATE WBC PRESENT, PREDOMINANTLY PMN     NO SQUAMOUS EPITHELIAL CELLS SEEN     MODERATE GRAM POSITIVE COCCI IN CLUSTERS     Performed at [facility]   Culture     Final   Value: ABUNDANT STAPHYLOCOCCUS AUREUS     Note: RIFAMPIN AND GENTAMICIN SHOULD NOT BE USED AS SINGLE DRUGS FOR TREATMENT OF STAPH INFECTIONS.     Performed at [facility]   Report Status 09/01/2012 FINAL   Final   Organism ID, Bacteria STAPHYLOCOCCUS AUREUS   Final  ANAEROBIC CULTURE     Status: None   Collection Time    08/29/12  3:01 PM      Result Value Range Status   Specimen Description ABSCESS   Final   Special Requests RIGHT RETROPERITONEAL HEMATOMA   Final   Gram Stain     Final   Value: MODERATE WBC PRESENT, PREDOMINANTLY PMN     NO SQUAMOUS EPITHELIAL CELLS SEEN     M0 GRAM POSITIVE COCCI     IN PAIRS INCISION     Performed at [facility]   Culture     Final   Value: NO ANAEROBES ISOLATED     Performed at [facility]   Report Status 09/03/2012 FINAL   Final  SURGICAL PCR SCREEN     Status: None   Collection Time    08/31/12  7:00 AM      Result Value Range Status   MRSA, PCR NEGATIVE  NEGATIVE Final   Staphylococcus aureus NEGATIVE  NEGATIVE Final   Comment:            The Xpert SA Assay (FDA     approved for NASAL specimens     in patients over 93 years of age),     is one component of     a comprehensive surveillance     program.  Test performance has     been validated by [employer] for patients greater     than or equal to 30 year old.     It is not  intended     to diagnose infection nor to     guide or monitor treatment.  CULTURE, BLOOD (ROUTINE X 2)     Status: None   Collection Time    09/01/12  6:30 AM      Result Value Range Status   Specimen Description BLOOD RIGHT HAND   Final   Special Requests     Final    Value: BOTTLES DRAWN AEROBIC AND ANAEROBIC 10CC AEROBIC 6CC ANAEROBIC   Culture  Setup Time     Final   Value: 09/01/2012 11:05     Performed at [facility]   Culture     Final   Value:        BLOOD CULTURE RECEIVED NO GROWTH TO DATE CULTURE WILL BE HELD FOR 5 DAYS BEFORE ISSUING A FINAL NEGATIVE REPORT     Performed at [facility]   Report Status PENDING   Incomplete  CULTURE, BLOOD (ROUTINE X 2)     Status: None   Collection Time    09/01/12  6:35 AM      Result Value Range Status   Specimen Description BLOOD LEFT HAND   Final   Special Requests BOTTLES DRAWN AEROBIC ONLY 10CC   Final   Culture  Setup Time     Final   Value: 09/01/2012 11:05     Performed at [facility]   Culture     Final   Value:        BLOOD CULTURE RECEIVED NO GROWTH TO DATE CULTURE WILL BE HELD FOR 5 DAYS BEFORE ISSUING A FINAL NEGATIVE REPORT     Performed at [facility]   Report Status PENDING   Incomplete  ANAEROBIC CULTURE     Status: None   Collection Time    09/01/12  9:08 AM      Result Value Range Status   Specimen Description ABSCESS HIP RIGHT   Final   Special Requests RIGHT HIP MUSCULAR ABSCESS   Final   Gram Stain     Final   Value: ABUNDANT WBC PRESENT,BOTH PMN AND MONONUCLEAR     MODERATE GRAM POSITIVE COCCI     IN PAIRS IN CLUSTERS Performed at [facility]     Performed at [facility]   Culture     Final   Value: NO ANAEROBES ISOLATED; CULTURE IN PROGRESS FOR 5 DAYS     Performed at [facility]   Report Status PENDING   Incomplete  GRAM STAIN     Status: None   Collection Time    09/01/12  9:08 AM      Result Value Range Status   Specimen Description ABSCESS HIP RIGHT   Final   Special Requests RIGHT HIP MUSCULAR ABSCESS   Final   Gram Stain     Final   Value: ABUNDANT WBC PRESENT,BOTH PMN AND MONONUCLEAR     MODERATE GRAM POSITIVE COCCI IN PAIRS IN CLUSTERS     CALLED TO A RODRIGUEZ,RN 09/01/12 0955 BY K SCHULTZ   Report  Status 09/01/2012 FINAL   Final  CULTURE, ROUTINE-ABSCESS     Status: None   Collection Time    09/01/12  9:08 AM      Result Value Range Status   Specimen Description ABSCESS HIP RIGHT   Final   Special Requests RIGHT HIP MUSCULAR ABSCESS   Final   Gram Stain     Final   Value: ABUNDANT WBC PRESENT,BOTH PMN  AND MONONUCLEAR     MODERATE GRAM POSITIVE COCCI     IN PAIRS IN CLUSTERS Performed at [facility]     Performed at [facility]   Culture     Final   Value: FEW STAPHYLOCOCCUS AUREUS     Note: RIFAMPIN AND GENTAMICIN SHOULD NOT BE USED AS SINGLE DRUGS FOR TREATMENT OF STAPH INFECTIONS.     Performed at [facility]   Report Status 09/04/2012 FINAL   Final   Organism ID, Bacteria STAPHYLOCOCCUS AUREUS   Final  TISSUE CULTURE     Status: None   Collection Time    09/02/12  2:15 PM      Result Value Range Status   Specimen Description TISSUE RIGHT LEG   Final   Special Requests POF ANCEF POSTERIOR HIP CAPSULE   Final   Gram Stain     Final   Value: NO WBC SEEN     NO ORGANISMS SEEN     Performed at [facility]   Culture     Final   Value: NO GROWTH 1 DAY     Performed at [facility]   Report Status PENDING   Incomplete    Anti-infectives   Start     Dose/Rate Route Frequency Ordered Stop   09/02/12 1400  ceFAZolin (ANCEF) IVPB 1 g/50 mL premix  Status:  Discontinued     1 g 100 mL/hr over 30 Minutes Intravenous To Surgery 09/02/12 1358 09/02/12 2121   09/01/12 1000  rifampin (RIFADIN) capsule 300 mg     300 mg Oral Every 12 hours 08/31/12 2038     08/31/12 0930  gentamicin (GARAMYCIN) 80 mg in sodium chloride irrigation 0.9 % 500 mL irrigation  Status:  Discontinued       As needed 08/31/12 0931 08/31/12 1004   08/31/12 0630  gentamicin (GARAMYCIN) 80 mg in sodium chloride irrigation 0.9 % 500 mL irrigation  Status:  Discontinued     80 mg Irrigation On call 08/31/12 0627 08/31/12 1149   08/31/12 0630  ceFAZolin (ANCEF) IVPB 2  g/50 mL premix     2 g 100 mL/hr over 30 Minutes Intravenous On call 08/31/12 0627 08/31/12 0913   08/28/12 1400  ceFAZolin (ANCEF) IVPB 1 g/50 mL premix     1 g 100 mL/hr over 30 Minutes Intravenous 3 times per day 08/28/12 1136     08/28/12 0400  vancomycin (VANCOCIN) 1,500 mg in sodium chloride 0.9 % 500 mL IVPB  Status:  Discontinued     1,500 mg 250 mL/hr over 120 Minutes Intravenous Every 24 hours 08/27/12 0431 08/29/12 1425   08/27/12 0445  vancomycin (VANCOCIN) 2,000 mg in sodium chloride 0.9 % 500 mL IVPB     2,000 mg 250 mL/hr over 120 Minutes Intravenous  Once 08/27/12 0431 08/27/12 0722   08/26/12 1000  cefTRIAXone (ROCEPHIN) 1 g in dextrose 5 % 50 mL IVPB  Status:  Discontinued     1 g 100 mL/hr over 30 Minutes Intravenous Every 24 hours 08/26/12 0913 08/27/12 1610      Assessment: 79 y/o M with multiple infections to continue on Cefazolin per pharmacy. WBC 15<15.6. Scr 1.27<1.41 with CrCl ~ 60. Tmax 98.9.   CTX 8/1>>8/3 Vancomycin 8/1>>8/4 Ancef 8/3>> Rifampin 8/7>>  8/1 UCx - K. Ozaenae (R-amp, I-NTF) 8/1 BCx2 - MSSA 8/3 BCx2 - NG 8/4 RP abscess - MSSA 8/7 BCx2 - NGTD 8/7 R hip abscess - MSSA 8/8 Tissue R  leg -NGTD   Goal of Therapy:  Eradication of infection  Plan:  -Continue Cefazolin 1g IV q8h -Trend WBC, temp, renal function -F/U plans for length of treatment  Thank you for allowing me to take part in this patient's care,  Abran Duke, PharmD Clinical Pharmacist Phone: [phone] Pager: [phone] 09/04/2012 8:40 AM

## 2012-09-04 NOTE — Progress Notes (Signed)
PATIENT ID: Aseem Noecker   2 Days Post-Op Procedure(s) (LRB): IRRIGATION AND DEBRIDEMENT RIGHT HIP (Right) REVISION OF POLYETHYLENE LINER AND FEMORAL HEAD  (Right)  Subjective: No complaints.  Had some nausea when tried to get OOB to chair yesterday.  No sig pain in hip.  Drain still putting out.  Objective:  Filed Vitals:   09/04/12 0736  BP: 128/65  Pulse: 96  Temp: 98.8 F (37.1 C)  Resp: 12     R hip dressing C/D/I, drains intact Distally NVID  Labs:   Recent Labs  09/02/12 0540 09/03/12 0530 09/04/12 0440  HGB 10.5* 10.8* 10.9*   Recent Labs  09/03/12 0530 09/04/12 0440  WBC 15.6* 15.0*  RBC 3.60* 3.58*  HCT 31.7* 31.5*  PLT 303 305   Recent Labs  09/03/12 0530 09/04/12 0440  NA 132* 129*  K 4.6 4.0  CL 97 92*  CO2 28 31  BUN 20 16  CREATININE 1.41* 1.27  GLUCOSE 141* 196*  CALCIUM 8.5 8.5    Assessment and Plan:POD 2s/p I&D r hip WBAT for bed to chair  Primary team decided to go with ASA instead of coumadin due to bleeding risk Dr. Turner Daniels to see tomorrow   VTE proph: Marlowe Kays

## 2012-09-05 ENCOUNTER — Encounter (HOSPITAL_COMMUNITY): Payer: Self-pay | Admitting: Orthopedic Surgery

## 2012-09-05 DIAGNOSIS — T8450XA Infection and inflammatory reaction due to unspecified internal joint prosthesis, initial encounter: Principal | ICD-10-CM

## 2012-09-05 DIAGNOSIS — Z96649 Presence of unspecified artificial hip joint: Secondary | ICD-10-CM

## 2012-09-05 LAB — BASIC METABOLIC PANEL
BUN: 17 mg/dL (ref 6–23)
CO2: 28 mEq/L (ref 19–32)
Chloride: 97 mEq/L (ref 96–112)
Creatinine, Ser: 1.34 mg/dL (ref 0.50–1.35)
GFR calc Af Amer: 56 mL/min — ABNORMAL LOW (ref >90)
Glucose, Bld: 106 mg/dL — ABNORMAL HIGH (ref 70–99)
Potassium: 3.7 mEq/L (ref 3.5–5.1)

## 2012-09-05 LAB — URINALYSIS, ROUTINE W REFLEX MICROSCOPIC
Ketones, ur: 15 mg/dL — AB
Nitrite: POSITIVE — AB
Specific Gravity, Urine: 1.014 (ref 1.005–1.030)
Urobilinogen, UA: 4 mg/dL — ABNORMAL HIGH (ref 0.0–1.0)
pH: 5.5 (ref 5.0–8.0)

## 2012-09-05 LAB — CBC
HCT: 29.2 % — ABNORMAL LOW (ref 39.0–52.0)
Hemoglobin: 9.9 g/dL — ABNORMAL LOW (ref 13.0–17.0)
MCHC: 33.9 g/dL (ref 30.0–36.0)
MCV: 88.5 fL (ref 78.0–100.0)
RDW: 16 % — ABNORMAL HIGH (ref 11.5–15.5)

## 2012-09-05 LAB — GLUCOSE, CAPILLARY
Glucose-Capillary: 149 mg/dL — ABNORMAL HIGH (ref 70–99)
Glucose-Capillary: 134 mg/dL — ABNORMAL HIGH (ref 70–99)

## 2012-09-05 LAB — URINE MICROSCOPIC-ADD ON

## 2012-09-05 LAB — PROTIME-INR: Prothrombin Time: 16.7 seconds — ABNORMAL HIGH (ref 11.6–15.2)

## 2012-09-05 MED ORDER — CEFAZOLIN SODIUM-DEXTROSE 2-3 GM-% IV SOLR
2.0000 g | Freq: Three times a day (TID) | INTRAVENOUS | Status: DC
  Administered 2012-09-05 – 2012-09-08 (×8): 2 g via INTRAVENOUS
  Filled 2012-09-05 (×12): qty 50

## 2012-09-05 NOTE — Progress Notes (Signed)
[facility] [place] TEAM 1 - Stepdown/ICU TEAM  [name] ZOX:[id number] DOB: [birth year] DOA: 08/25/2012 PCP: Malka So., MD  Brief Clinical Summary / HPI: 79 y.o. male with pacemaker, R prosthetic hip, and laminectomy in January of 2014 admitted with R leg pain, weakness, and fevers. He was subsequently found to have MSSA bacteremia and a large retroperitoneal abscess adjacent to his prosthetic hip.  Orthopedics performed irrigation and debridement right total hip with removal of all synovium, revision of femoral head, and revision of polyethylene liner placement on 09/02/12.  A TEE failed to show vegetations on valves or overtly on PM. PM was removed by EP.  Assessment/Plan:  MSSA bacteremia  -PICC line if 09/01/12 blood culture remains neg x 5 days  -rifamipin started 09/01/12, cefazolin started 08/28/2012 - abx per ID Team -infected retroperitoneal fluid collection-->MSSA  -TEE--no vegetations  -08/31/12 PPM removed--Dr. Ladona Ridgel  -09/01/12 aspirate of iliotibial band--MSSA  -s/p radical irrigation and debridement right total hip with removal of all synovium, revision of femoral head, revision of polyethylene liner placement of large bore Hemovac drains 09/02/2012 - Dr. Turner Daniels  Infected retroperitoneal hematoma and infected R-hip collection  -CT of the hip and CT of the abdomen and pelvis worrisome for complex fluid collection around the right hip and also retroperitoneal hematoma -appreciate orthopedic follow up - I&D and exploration 09/02/12  -Interventional radiology s/p aspiration and drainage or retroperitoneal hematoma 08/29/12.  -09/01/12 IR aspiration of IT band-->S.aureus  -09/02/12--removal of all synovium, revision of femoral head, and polyethylene liner replacement by Dr. Turner Daniels-- follow culture data -discussed with Dr. Cyndia Skeeters limited to transfer from bed to chair only  supratherapeutic INR/Afib  -INR 7.19 on day of admission  -Status post vitamin K doses x2 and FFP   -CHADS-VasC= 3-->in light of the patient's retroperitoneal hematoma, Dr. Arbutus Leas discussed the risks, benefits, and alternatives of ultimately restarting anticoagulation with warfarin versus aspirin when the patient is stable  -family and patient decided on aspirin  right lower extremity weakness and back pain  Likely secondary to infected retroperitoneal hematoma and possible complex fluid collection noted around the right hip   acute renal failure/dehydration/hypokalemia  -renal function is stable at this time  -Likely combination of hypovolemia and ATN   atrial fibrillation  Continue beta blocker for rate control. INR has been reversed  -Patient is status post permanent pacemaker.  -09/02/2012--patient was on a diltiazem drip postoperatively - changed diltiazem to a long-acting formulation - HR remains well controlled at this time   normocytic anemia  Hgb has drifted downwrd 1G over the last 24hrs - will need recheck in AM  DM2 CBG currently well controlled - no change in tx plan today   Hyponatremia -Likely due to hypotonic fluid infusion in the last 24 hours -improving with d/c of IV free water   Family Communication: no family present at time of exam  Disposition Plan: SNF when medically stable   Antibiotics:  IV vancomycin 08/26/2012--->08/29/12  IV Rocephin 08/26/2012--->08/27/12  IV Ancef 08/28/2012-->  Rifampin 09/01/12-->  Subjective: Patient is somewhat somnolent at the time of my visit today.  He denies specific complaints.  He denies cp, sob, n/v, or abdom pain.    Objective: Filed Vitals:   09/04/12 2310 09/05/12 0308 09/05/12 0706 09/05/12 1400  BP: 145/74 133/82 115/73 124/76  Pulse: 103 85 93 88  Temp: 98.1 F (36.7 C) 98.4 F (36.9 C) 99.2 F (37.3 C) 98 F (36.7 C)  TempSrc: Oral Oral Oral Oral  Resp: 11  10 17   Height:      Weight:      SpO2: 99% 99% 95% 96%    Intake/Output Summary (Last 24 hours) at 09/05/12 1546 Last data filed at 09/05/12  1300  Gross per 24 hour  Intake   1670 ml  Output   1447 ml  Net    223 ml   Exam: General: No acute respiratory distress at rest  Lungs: distant breath sounds - no focal crackles - no appreciable wheeze Cardiovascular: distant HS - regular rate - no appreciable gallup, rub, or M Abdomen: nondistended, soft, bowel sounds positive but hypoactive, no rebound, no ascites, no appreciable mass, multiple drains pierce the R LW with no signif erythema or d/c at the puncture sites  Extremities: No significant cyanosis, clubbing;  trace edema bilateral lower extremities  Data Reviewed: Basic Metabolic Panel:  Recent Labs Lab 09/01/12 0620 09/02/12 0540 09/03/12 0530 09/04/12 0440 09/05/12 0350  NA 135 132* 132* 129* 133*  K 3.8 3.8 4.6 4.0 3.7  CL 95* 94* 97 92* 97  CO2 31 28 28 31 28   GLUCOSE 100* 86 141* 196* 106*  BUN 21 19 20 16 17   CREATININE 1.16 1.15 1.41* 1.27 1.34  CALCIUM 9.0 9.0 8.5 8.5 8.6   Liver Function Tests: No results found for this basename: AST, ALT, ALKPHOS, BILITOT, PROT, ALBUMIN,  in the last 168 hours  CBC:  Recent Labs Lab 08/30/12 0600 09/01/12 0620 09/02/12 0540 09/03/12 0530 09/04/12 0440 09/05/12 0350  WBC 13.2* 14.1* 12.5* 15.6* 15.0* 11.3*  NEUTROABS 11.2*  --   --   --   --   --   HGB 10.3* 10.6* 10.5* 10.8* 10.9* 9.9*  HCT 31.3* 31.9* 32.1* 31.7* 31.5* 29.2*  MCV 89.9 89.9 89.7 88.1 88.0 88.5  PLT 252 292 310 303 305 313   CBG:  Recent Labs Lab 09/04/12 1227 09/04/12 1536 09/04/12 2159 09/05/12 0755 09/05/12 1215  GLUCAP 118* 126* 123* 102* 149*    Recent Results (from the past 240 hour(s))  CULTURE, BLOOD (ROUTINE X 2)     Status: None   Collection Time    08/28/12 12:45 AM      Result Value Range Status   Specimen Description BLOOD RIGHT HAND   Final   Special Requests BOTTLES DRAWN AEROBIC AND ANAEROBIC 10CC   Final   Culture  Setup Time     Final   Value: 08/28/2012 15:05     Performed at [facility]    Culture     Final   Value: NO GROWTH 5 DAYS     Performed at [facility]   Report Status 09/03/2012 FINAL   Final  CULTURE, BLOOD (ROUTINE X 2)     Status: None   Collection Time    08/28/12 12:55 AM      Result Value Range Status   Specimen Description BLOOD RIGHT HAND   Final   Special Requests BOTTLES DRAWN AEROBIC ONLY 10CC   Final   Culture  Setup Time     Final   Value: 08/28/2012 15:05     Performed at [facility]   Culture     Final   Value: NO GROWTH 5 DAYS     Performed at [facility]   Report Status 09/03/2012 FINAL   Final  CULTURE, ROUTINE-ABSCESS     Status: None   Collection Time    08/29/12  3:01 PM  Result Value Range Status   Specimen Description ABSCESS   Final   Special Requests RIGHT RETROPERITONEAL HEMATOMA   Final   Gram Stain     Final   Value: MODERATE WBC PRESENT, PREDOMINANTLY PMN     NO SQUAMOUS EPITHELIAL CELLS SEEN     MODERATE GRAM POSITIVE COCCI IN CLUSTERS     Performed at [facility]   Culture     Final   Value: ABUNDANT STAPHYLOCOCCUS AUREUS     Note: RIFAMPIN AND GENTAMICIN SHOULD NOT BE USED AS SINGLE DRUGS FOR TREATMENT OF STAPH INFECTIONS.     Performed at [facility]   Report Status 09/01/2012 FINAL   Final   Organism ID, Bacteria STAPHYLOCOCCUS AUREUS   Final  ANAEROBIC CULTURE     Status: None   Collection Time    08/29/12  3:01 PM      Result Value Range Status   Specimen Description ABSCESS   Final   Special Requests RIGHT RETROPERITONEAL HEMATOMA   Final   Gram Stain     Final   Value: MODERATE WBC PRESENT, PREDOMINANTLY PMN     NO SQUAMOUS EPITHELIAL CELLS SEEN     M0 GRAM POSITIVE COCCI     IN PAIRS INCISION     Performed at [facility]   Culture     Final   Value: NO ANAEROBES ISOLATED     Performed at [facility]   Report Status 09/03/2012 FINAL   Final  SURGICAL PCR SCREEN     Status: None   Collection Time    08/31/12  7:00 AM      Result Value  Range Status   MRSA, PCR NEGATIVE  NEGATIVE Final   Staphylococcus aureus NEGATIVE  NEGATIVE Final   Comment:            The Xpert SA Assay (FDA     approved for NASAL specimens     in patients over 1 years of age),     is one component of     a comprehensive surveillance     program.  Test performance has     been validated by [employer] for patients greater     than or equal to 55 year old.     It is not intended     to diagnose infection nor to     guide or monitor treatment.  CULTURE, BLOOD (ROUTINE X 2)     Status: None   Collection Time    09/01/12  6:30 AM      Result Value Range Status   Specimen Description BLOOD RIGHT HAND   Final   Special Requests     Final   Value: BOTTLES DRAWN AEROBIC AND ANAEROBIC 10CC AEROBIC 6CC ANAEROBIC   Culture  Setup Time     Final   Value: 09/01/2012 11:05     Performed at [facility]   Culture     Final   Value:        BLOOD CULTURE RECEIVED NO GROWTH TO DATE CULTURE WILL BE HELD FOR 5 DAYS BEFORE ISSUING A FINAL NEGATIVE REPORT     Performed at [facility]   Report Status PENDING   Incomplete  CULTURE, BLOOD (ROUTINE X 2)     Status: None   Collection Time    09/01/12  6:35 AM      Result Value Range Status   Specimen Description BLOOD LEFT  HAND   Final   Special Requests BOTTLES DRAWN AEROBIC ONLY 10CC   Final   Culture  Setup Time     Final   Value: 09/01/2012 11:05     Performed at [facility]   Culture     Final   Value:        BLOOD CULTURE RECEIVED NO GROWTH TO DATE CULTURE WILL BE HELD FOR 5 DAYS BEFORE ISSUING A FINAL NEGATIVE REPORT     Performed at [facility]   Report Status PENDING   Incomplete  ANAEROBIC CULTURE     Status: None   Collection Time    09/01/12  9:08 AM      Result Value Range Status   Specimen Description ABSCESS HIP RIGHT   Final   Special Requests RIGHT HIP MUSCULAR ABSCESS   Final   Gram Stain     Final   Value: ABUNDANT WBC PRESENT,BOTH PMN AND  MONONUCLEAR     MODERATE GRAM POSITIVE COCCI     IN PAIRS IN CLUSTERS Performed at [facility]     Performed at [facility]   Culture     Final   Value: NO ANAEROBES ISOLATED; CULTURE IN PROGRESS FOR 5 DAYS     Performed at [facility]   Report Status PENDING   Incomplete  GRAM STAIN     Status: None   Collection Time    09/01/12  9:08 AM      Result Value Range Status   Specimen Description ABSCESS HIP RIGHT   Final   Special Requests RIGHT HIP MUSCULAR ABSCESS   Final   Gram Stain     Final   Value: ABUNDANT WBC PRESENT,BOTH PMN AND MONONUCLEAR     MODERATE GRAM POSITIVE COCCI IN PAIRS IN CLUSTERS     CALLED TO A RODRIGUEZ,RN 09/01/12 0955 BY K SCHULTZ   Report Status 09/01/2012 FINAL   Final  CULTURE, ROUTINE-ABSCESS     Status: None   Collection Time    09/01/12  9:08 AM      Result Value Range Status   Specimen Description ABSCESS HIP RIGHT   Final   Special Requests RIGHT HIP MUSCULAR ABSCESS   Final   Gram Stain     Final   Value: ABUNDANT WBC PRESENT,BOTH PMN AND MONONUCLEAR     MODERATE GRAM POSITIVE COCCI     IN PAIRS IN CLUSTERS Performed at [facility]     Performed at [facility]   Culture     Final   Value: FEW STAPHYLOCOCCUS AUREUS     Note: RIFAMPIN AND GENTAMICIN SHOULD NOT BE USED AS SINGLE DRUGS FOR TREATMENT OF STAPH INFECTIONS.     Performed at [facility]   Report Status 09/04/2012 FINAL   Final   Organism ID, Bacteria STAPHYLOCOCCUS AUREUS   Final  TISSUE CULTURE     Status: None   Collection Time    09/02/12  2:15 PM      Result Value Range Status   Specimen Description TISSUE RIGHT LEG   Final   Special Requests POF ANCEF POSTERIOR HIP CAPSULE   Final   Gram Stain     Final   Value: NO WBC SEEN     NO ORGANISMS SEEN     Performed at [facility]   Culture     Final   Value: NO GROWTH 3 DAYS     Performed at [facility]  Report Status PENDING   Incomplete     Scheduled  Meds: . aspirin  325 mg Oral Daily  .  ceFAZolin (ANCEF) IV  2 g Intravenous Q8H  . diltiazem  120 mg Oral Daily  . docusate sodium  100 mg Oral BID  . finasteride  5 mg Oral Daily  . insulin aspart  0-9 Units Subcutaneous TID WC  . levothyroxine  75 mcg Oral QAC breakfast  . metolazone  2.5 mg Oral Q M,W,F  . metoprolol succinate  12.5 mg Oral Daily  . potassium chloride  40 mEq Oral Daily  . rifampin  300 mg Oral Q12H    Lonia Blood, MD [facility] Office  [phone] Pager [phone]  On-Call/Text Page:      [email or web address]      password [email or web address]  09/05/2012, 3:46 PM   LOS: 11 days

## 2012-09-05 NOTE — Progress Notes (Signed)
Subjective: 3 Days Post-Op Procedure(s) (LRB): IRRIGATION AND DEBRIDEMENT RIGHT HIP (Right) REVISION OF POLYETHYLENE LINER AND FEMORAL HEAD  (Right) Patient reports pain as 2 on 0-10 scale.    Objective: Vital signs in last 24 hours: Temp:  [98 F (36.7 C)-99.2 F (37.3 C)] 98.1 F (36.7 C) (08/11 1800) Pulse Rate:  [85-103] 102 (08/11 1800) Resp:  [10-18] 18 (08/11 1800) BP: (115-145)/(69-82) 121/69 mmHg (08/11 1800) SpO2:  [95 %-99 %] 98 % (08/11 1800)  Intake/Output from previous day: 08/10 0701 - 08/11 0700 In: 1790 [P.O.:720; IV Piggyback:1050] Out: 1867 [Urine:1750; Drains:117] Intake/Output this shift:     Recent Labs  09/03/12 0530 09/04/12 0440 09/05/12 0350  HGB 10.8* 10.9* 9.9*  Tissue cultures from 12/03/2012.  Radical irrigation and debridement are no growth at 3 days.  This tissue was deep synovium from the hip Joint itself.  Recent Labs  09/04/12 0440 09/05/12 0350  WBC 15.0* 11.3*  RBC 3.58* 3.30*  HCT 31.5* 29.2*  PLT 305 313    Recent Labs  09/04/12 0440 09/05/12 0350  NA 129* 133*  K 4.0 3.7  CL 92* 97  CO2 31 28  BUN 16 17  CREATININE 1.27 1.34  GLUCOSE 196* 106*  CALCIUM 8.5 8.6    Recent Labs  09/04/12 0440 09/05/12 0350  INR 1.29 1.39    Neurologically intact Neurovascular intact Sensation intact distally Intact pulses distally Dorsiflexion/Plantar flexion intact Incision: no drainage No cellulitis present Compartment soft  Assessment/Plan: 3 Days Post-Op Procedure(s) (LRB): IRRIGATION AND DEBRIDEMENT RIGHT HIP (Right) REVISION OF POLYETHYLENE LINER AND FEMORAL HEAD  (Right) Up with therapy   Drain output since I saw the patient at 6 this morning has been 12 cc from the Hemovac. I will pull the hip drains tomorrow and advance his physical therapy gradually.  I concur with infectious diseases, regarding the IV antibiotics and transition to oral antibiotics.I am encouraged that the deep tissue cultures are negative,  and that this tissue had no white cells or organisms on Gram stain.  This tissue was immediately adjacent to prosthetic implant did not appear to be grossly inflamed.  [name] J 09/05/2012, 8:40 PM

## 2012-09-05 NOTE — Progress Notes (Addendum)
[facility]  Cefazolin day # 9 Rifampin day #5   Subjective: No new complaints   Antibiotics:  Anti-infectives   Start     Dose/Rate Route Frequency Ordered Stop   09/02/12 1400  ceFAZolin (ANCEF) IVPB 1 g/50 mL premix  Status:  Discontinued     1 g 100 mL/hr over 30 Minutes Intravenous To Surgery 09/02/12 1358 09/02/12 2121   09/01/12 1000  rifampin (RIFADIN) capsule 300 mg     300 mg Oral Every 12 hours 08/31/12 2038     08/31/12 0930  gentamicin (GARAMYCIN) 80 mg in sodium chloride irrigation 0.9 % 500 mL irrigation  Status:  Discontinued       As needed 08/31/12 0931 08/31/12 1004   08/31/12 0630  gentamicin (GARAMYCIN) 80 mg in sodium chloride irrigation 0.9 % 500 mL irrigation  Status:  Discontinued     80 mg Irrigation On call 08/31/12 0627 08/31/12 1149   08/31/12 0630  ceFAZolin (ANCEF) IVPB 2 g/50 mL premix     2 g 100 mL/hr over 30 Minutes Intravenous On call 08/31/12 0627 08/31/12 0913   08/28/12 1400  ceFAZolin (ANCEF) IVPB 1 g/50 mL premix     1 g 100 mL/hr over 30 Minutes Intravenous 3 times per day 08/28/12 1136     08/28/12 0400  vancomycin (VANCOCIN) 1,500 mg in sodium chloride 0.9 % 500 mL IVPB  Status:  Discontinued     1,500 mg 250 mL/hr over 120 Minutes Intravenous Every 24 hours 08/27/12 0431 08/29/12 1425   08/27/12 0445  vancomycin (VANCOCIN) 2,000 mg in sodium chloride 0.9 % 500 mL IVPB     2,000 mg 250 mL/hr over 120 Minutes Intravenous  Once 08/27/12 0431 08/27/12 0722   08/26/12 1000  cefTRIAXone (ROCEPHIN) 1 g in dextrose 5 % 50 mL IVPB  Status:  Discontinued     1 g 100 mL/hr over 30 Minutes Intravenous Every 24 hours 08/26/12 0913 08/27/12 1610      Medications: Scheduled Meds: . aspirin  325 mg Oral Daily  .  ceFAZolin (ANCEF) IV  1 g Intravenous Q8H  . diltiazem  120 mg Oral Daily  . docusate sodium  100 mg Oral BID  . finasteride  5 mg Oral Daily  . insulin aspart  0-9 Units Subcutaneous TID WC  .  levothyroxine  75 mcg Oral QAC breakfast  . metolazone  2.5 mg Oral Q M,W,F  . metoprolol succinate  12.5 mg Oral Daily  . potassium chloride  40 mEq Oral Daily  . rifampin  300 mg Oral Q12H   Continuous Infusions:   PRN Meds:.acetaminophen, acetaminophen, ALPRAZolam, bisacodyl, HYDROcodone-acetaminophen, HYDROmorphone (DILAUDID) injection, menthol-cetylpyridinium, metoCLOPramide (REGLAN) injection, metoCLOPramide, morphine injection, ondansetron (ZOFRAN) IV, oxyCODONE-acetaminophen, phenol, senna-docusate   Objective: Weight change:   Intake/Output Summary (Last 24 hours) at 09/05/12 1314 Last data filed at 09/05/12 0800  Gross per 24 hour  Intake   1430 ml  Output   1497 ml  Net    -67 ml   Blood pressure 115/73, pulse 93, temperature 99.2 F (37.3 C), temperature source Oral, resp. rate 17, height 5' 11.5" (1.816 m), weight 244 lb 1.6 oz (110.723 kg), SpO2 95.00%. Temp:  [98.1 F (36.7 C)-99.2 F (37.3 C)] 99.2 F (37.3 C) (08/11 0706) Pulse Rate:  [85-103] 93 (08/11 0706) Resp:  [10-19] 17 (08/11 0706) BP: (115-145)/(68-82) 115/73 mmHg (08/11 0706) SpO2:  [95 %-100 %] 95 % (08/11 0706)  Physical Exam:  GEN:  ALERT oriented CV: rr no mgr, Skin pM site is bandaged Right hip with drains in place packing    Lab Results:  Recent Labs  09/04/12 0440 09/05/12 0350  WBC 15.0* 11.3*  HGB 10.9* 9.9*  HCT 31.5* 29.2*  PLT 305 313    BMET  Recent Labs  09/04/12 0440 09/05/12 0350  NA 129* 133*  K 4.0 3.7  CL 92* 97  CO2 31 28  GLUCOSE 196* 106*  BUN 16 17  CREATININE 1.27 1.34  CALCIUM 8.5 8.6    Micro Results: Recent Results (from the past 240 hour(s))  CULTURE, BLOOD (ROUTINE X 2)     Status: None   Collection Time    08/28/12 12:45 AM      Result Value Range Status   Specimen Description BLOOD RIGHT HAND   Final   Special Requests BOTTLES DRAWN AEROBIC AND ANAEROBIC 10CC   Final   Culture  Setup Time     Final   Value: 08/28/2012 15:05      Performed at [facility]   Culture     Final   Value: NO GROWTH 5 DAYS     Performed at [facility]   Report Status 09/03/2012 FINAL   Final  CULTURE, BLOOD (ROUTINE X 2)     Status: None   Collection Time    08/28/12 12:55 AM      Result Value Range Status   Specimen Description BLOOD RIGHT HAND   Final   Special Requests BOTTLES DRAWN AEROBIC ONLY 10CC   Final   Culture  Setup Time     Final   Value: 08/28/2012 15:05     Performed at [facility]   Culture     Final   Value: NO GROWTH 5 DAYS     Performed at [facility]   Report Status 09/03/2012 FINAL   Final  CULTURE, ROUTINE-ABSCESS     Status: None   Collection Time    08/29/12  3:01 PM      Result Value Range Status   Specimen Description ABSCESS   Final   Special Requests RIGHT RETROPERITONEAL HEMATOMA   Final   Gram Stain     Final   Value: MODERATE WBC PRESENT, PREDOMINANTLY PMN     NO SQUAMOUS EPITHELIAL CELLS SEEN     MODERATE GRAM POSITIVE COCCI IN CLUSTERS     Performed at [facility]   Culture     Final   Value: ABUNDANT STAPHYLOCOCCUS AUREUS     Note: RIFAMPIN AND GENTAMICIN SHOULD NOT BE USED AS SINGLE DRUGS FOR TREATMENT OF STAPH INFECTIONS.     Performed at [facility]   Report Status 09/01/2012 FINAL   Final   Organism ID, Bacteria STAPHYLOCOCCUS AUREUS   Final  ANAEROBIC CULTURE     Status: None   Collection Time    08/29/12  3:01 PM      Result Value Range Status   Specimen Description ABSCESS   Final   Special Requests RIGHT RETROPERITONEAL HEMATOMA   Final   Gram Stain     Final   Value: MODERATE WBC PRESENT, PREDOMINANTLY PMN     NO SQUAMOUS EPITHELIAL CELLS SEEN     M0 GRAM POSITIVE COCCI     IN PAIRS INCISION     Performed at [facility]   Culture     Final   Value: NO ANAEROBES ISOLATED     Performed at [facility]   Report Status 09/03/2012 FINAL   Final  SURGICAL PCR SCREEN     Status: None   Collection Time     08/31/12  7:00 AM      Result Value Range Status   MRSA, PCR NEGATIVE  NEGATIVE Final   Staphylococcus aureus NEGATIVE  NEGATIVE Final   Comment:            The Xpert SA Assay (FDA     approved for NASAL specimens     in patients over 26 years of age),     is one component of     a comprehensive surveillance     program.  Test performance has     been validated by [employer] for patients greater     than or equal to 69 year old.     It is not intended     to diagnose infection nor to     guide or monitor treatment.  CULTURE, BLOOD (ROUTINE X 2)     Status: None   Collection Time    09/01/12  6:30 AM      Result Value Range Status   Specimen Description BLOOD RIGHT HAND   Final   Special Requests     Final   Value: BOTTLES DRAWN AEROBIC AND ANAEROBIC 10CC AEROBIC 6CC ANAEROBIC   Culture  Setup Time     Final   Value: 09/01/2012 11:05     Performed at [facility]   Culture     Final   Value:        BLOOD CULTURE RECEIVED NO GROWTH TO DATE CULTURE WILL BE HELD FOR 5 DAYS BEFORE ISSUING A FINAL NEGATIVE REPORT     Performed at [facility]   Report Status PENDING   Incomplete  CULTURE, BLOOD (ROUTINE X 2)     Status: None   Collection Time    09/01/12  6:35 AM      Result Value Range Status   Specimen Description BLOOD LEFT HAND   Final   Special Requests BOTTLES DRAWN AEROBIC ONLY 10CC   Final   Culture  Setup Time     Final   Value: 09/01/2012 11:05     Performed at [facility]   Culture     Final   Value:        BLOOD CULTURE RECEIVED NO GROWTH TO DATE CULTURE WILL BE HELD FOR 5 DAYS BEFORE ISSUING A FINAL NEGATIVE REPORT     Performed at [facility]   Report Status PENDING   Incomplete  ANAEROBIC CULTURE     Status: None   Collection Time    09/01/12  9:08 AM      Result Value Range Status   Specimen Description ABSCESS HIP RIGHT   Final   Special Requests RIGHT HIP MUSCULAR ABSCESS   Final   Gram Stain     Final   Value:  ABUNDANT WBC PRESENT,BOTH PMN AND MONONUCLEAR     MODERATE GRAM POSITIVE COCCI     IN PAIRS IN CLUSTERS Performed at [facility]     Performed at [facility]   Culture     Final   Value: NO ANAEROBES ISOLATED; CULTURE IN PROGRESS FOR 5 DAYS     Performed at [facility]   Report Status PENDING   Incomplete  GRAM STAIN     Status: None   Collection Time  09/01/12  9:08 AM      Result Value Range Status   Specimen Description ABSCESS HIP RIGHT   Final   Special Requests RIGHT HIP MUSCULAR ABSCESS   Final   Gram Stain     Final   Value: ABUNDANT WBC PRESENT,BOTH PMN AND MONONUCLEAR     MODERATE GRAM POSITIVE COCCI IN PAIRS IN CLUSTERS     CALLED TO A RODRIGUEZ,RN 09/01/12 0955 BY K SCHULTZ   Report Status 09/01/2012 FINAL   Final  CULTURE, ROUTINE-ABSCESS     Status: None   Collection Time    09/01/12  9:08 AM      Result Value Range Status   Specimen Description ABSCESS HIP RIGHT   Final   Special Requests RIGHT HIP MUSCULAR ABSCESS   Final   Gram Stain     Final   Value: ABUNDANT WBC PRESENT,BOTH PMN AND MONONUCLEAR     MODERATE GRAM POSITIVE COCCI     IN PAIRS IN CLUSTERS Performed at [facility]     Performed at [facility]   Culture     Final   Value: FEW STAPHYLOCOCCUS AUREUS     Note: RIFAMPIN AND GENTAMICIN SHOULD NOT BE USED AS SINGLE DRUGS FOR TREATMENT OF STAPH INFECTIONS.     Performed at [facility]   Report Status 09/04/2012 FINAL   Final   Organism ID, Bacteria STAPHYLOCOCCUS AUREUS   Final  TISSUE CULTURE     Status: None   Collection Time    09/02/12  2:15 PM      Result Value Range Status   Specimen Description TISSUE RIGHT LEG   Final   Special Requests POF ANCEF POSTERIOR HIP CAPSULE   Final   Gram Stain     Final   Value: NO WBC SEEN     NO ORGANISMS SEEN     Performed at [facility]   Culture     Final   Value: NO GROWTH 3 DAYS     Performed at [facility]   Report Status PENDING    Incomplete    Studies/Results: No results found.    Assessment/Plan: [name] is a 79 y.o. male with  Pacemaker, Right prosthetic hip, Laminectomy in January of 2014 admitted with right leg pain weakness fevers. He has been found to have MSSA bacteremia and a large retroperitoneal abscess that is adjacent to his prosthetic hip.   Orthopedics performed resection of Radical irrigation and debridement right total hip with removal of all synovium, revision of femoral head, revision of polyethylene liner placement  On 09/02/12  TEE failed to show vegetations on valves or overtly on PM. PM is now out.   #1 MSSA bacteremia with PM infection,sp Extraction by Dr. Ladona Ridgel prosthetic hip infection  GREATLY APPRECIATE Dr. Ladona Ridgel and Dr. Wadie Lessen CRITICAL ROLES here!  --Repeat blood cultures drawn post PM removal on 08/31/12 on 09/01/12 are Ngtd  --HE IS NOW SP radical I and D and Right THA removal of synovium and revision of femoral head, and polyethylene liner  --I am going to treat him with at least 6 weeks of IV cefazolin along with Rifampin  POST OPERATIVELY with tentative stop date of 10/13/12   FOLLOWED BY PO ABX FOR PROTRACTED COURSE THEREAFTER  He will need weekly cbc cmp faxed to Dr Daiva Eves @[phone]  --I would delay re-implantaiton of PM for as long as possible preferably at least 4 weeks  --HIS ANCEF IS BEING RENALLY DOSED  BUT WONDER IF WE COULD push to 2 grams IV ANCEF Q 8 HOURS, I WILL CLARIFY WITH THEM  #2 intra-abdominal abscess:  Sp drain placement,  --WOULD REIMAGE ABDOMEN   PRIOR TO STOPPING ALL IV ANTIBIOTICS  #3  prosthetic hip infection: See above discussion, WILL GO WITH 6 WEEKS OF IV ANCEF PLUS PO RIFAMPIN POSTOP AND THEN WITH CHRONIC ORAL ABX  I will arrange HSFU in my clinic in the next 3 weeks Please call with further questions, I will sign off for now  ADDENDUM:  DISCUSSED WITH JEREMY FRENS FROM PHARMACY AND WE WILL INCREASE DOSE TO 2 GRAMS ANCEF Q 8 HOURS TO  BE CONTINUED THRU 10/13/12   LOS: 11 days   Acey Lav 09/05/2012, 1:14 PM

## 2012-09-05 NOTE — Progress Notes (Signed)
Subjective: Patient states he is feeling well and right hip pain controlled.   Objective: Physical Exam: BP 115/73  Pulse 93  Temp(Src) 99.2 F (37.3 C) (Oral)  Resp 17  Ht 5' 11.5" (1.816 m)  Wt 244 lb 1.6 oz (110.723 kg)  BMI 33.57 kg/m2  SpO2 95%  Drain #1 C/D/I, output bloody 12cc, no signs of infection or hematoma at site. Drain #2 C/D/I, output bloody 20cc, no signs of infection or hematoma at site. Surgical drain #3 C/D/I, output bloody 85cc, no signs of infection or hematoma at site.   Labs: CBC  Recent Labs  09/04/12 0440 09/05/12 0350  WBC 15.0* 11.3*  HGB 10.9* 9.9*  HCT 31.5* 29.2*  PLT 305 313   BMET  Recent Labs  09/04/12 0440 09/05/12 0350  NA 129* 133*  K 4.0 3.7  CL 92* 97  CO2 31 28  GLUCOSE 196* 106*  BUN 16 17  CREATININE 1.27 1.34  CALCIUM 8.5 8.6   LFT No results found for this basename: PROT, ALBUMIN, AST, ALT, ALKPHOS, BILITOT, BILIDIR, IBILI, LIPASE,  in the last 72 hours PT/INR  Recent Labs  09/04/12 0440 09/05/12 0350  LABPROT 15.8* 16.7*  INR 1.29 1.39     Studies/Results: No results found.  Assessment/Plan: Infected-MSSA right retroperitoneal hematoma s/p (2) 21F drains placed 08/29/12, output 12cc and 20cc, consider repeat CT soon. CT prior to drain removal. Continue to flush and monitor output. Wbc trending down 11.3 (15), afebrile. Patient also with surgical drain in place s/p irrigation and debridement of right total hip 09/02/12. Output 85cc.    LOS: 11 days    Cloretta Ned 09/05/2012 9:38 AM

## 2012-09-05 NOTE — Progress Notes (Signed)
Physical Therapy Treatment Patient Details Name: [name] MRN: [id number] DOB: Jun 04, 1930 Today's Date: 09/05/2012 Time: 3086-5784 PT Time Calculation (min): 23 min  PT Assessment / Plan / Recommendation  History of Present Illness [name] is a 79 y.o. male with  Pacemaker, Right prosthetic hip, Laminectomy in January of 2014 admitted with right leg pain weakness fevers. He has been found to have MSSA bacteremia and a large retroperitoneal abscess that is adjacent to his prosthetic hip.   Pt with s/p I & D right hip with R THA revision.   PT Comments   Patient is slowly progressing with mobility.  Noted MD note states for bed to chair only and plan for SNF, therefore recommendations updated per MD note.  PT to follow acutely.  Feel progress may be more medically limited due to fluid collections and infection.  Follow Up Recommendations  Supervision/Assistance - 24 hour;SNF     Does the patient have the potential to tolerate intense rehabilitation   N/A  Barriers to Discharge  None      Equipment Recommendations  None recommended by PT    Recommendations for Other Services  None  Frequency Min 5X/week   Progress towards PT Goals Progress towards PT goals: Progressing toward goals  Plan Discharge plan needs to be updated    Precautions / Restrictions Precautions Precautions: Fall;Posterior Hip Restrictions RLE Weight Bearing: Weight bearing as tolerated   Pertinent Vitals/Pain Denies pain after up to chair    Mobility  Bed Mobility Supine to Sit: 1: +2 Total assist;HOB flat Supine to Sit: Patient Percentage: 50% Sitting - Scoot to Edge of Bed: 3: Mod assist Details for Bed Mobility Assistance: assist for left LE and trunk upright, max cues for increased participation/technique Transfers Transfers: Stand Pivot Transfers Sit to Stand: From bed;1: +2 Total assist;From elevated surface Sit to Stand: Patient Percentage: 30% Stand to Sit: 1: +2 Total assist;To  elevated surface;To chair/3-in-1;With armrests Stand to Sit: Patient Percentage: 40% Stand Pivot Transfers: 1: +2 Total assist Stand Pivot Transfers: Patient Percentage: 50% Details for Transfer Assistance: difficulty to get to standing initially due to weakness right LE, lifted with pad under pt.  Able to stand with walker and step to turn to sit in recliner    Exercises Total Joint Exercises Ankle Circles/Pumps: AROM;Both;10 reps Quad Sets: AAROM;Right;5 reps;Supine Long Arc Quad: AAROM;Right;10 reps;Seated    PT Goals (current goals can now be found in the care plan section)    Visit Information  Last PT Received On: 09/05/12 Assistance Needed: +2 History of Present Illness: [name] is a 79 y.o. male with  Pacemaker, Right prosthetic hip, Laminectomy in January of 2014 admitted with right leg pain weakness fevers. He has been found to have MSSA bacteremia and a large retroperitoneal abscess that is adjacent to his prosthetic hip.   Pt with s/p I & D right hip with R THA revision.    Subjective Data   Ready to get out of bed.   Cognition  Cognition Arousal/Alertness: Awake/alert Behavior During Therapy: WFL for tasks assessed/performed Overall Cognitive Status: Within Functional Limits for tasks assessed    Balance  Static Sitting Balance Static Sitting - Balance Support: Feet supported;Bilateral upper extremity supported Static Sitting - Level of Assistance: 5: Stand by assistance Static Sitting - Comment/# of Minutes: leaning posterior at times  End of Session PT - End of Session Equipment Utilized During Treatment: Gait belt Activity Tolerance: Patient tolerated treatment well Patient left: in bed;with call bell/phone within  reach;with family/visitor present Nurse Communication: Mobility status;Other (comment) (sitting with feet dependent, need to elevate in 30 min)   GP     Shonice Wrisley,CYNDI 09/05/2012, 4:57 PM Sheran Lawless, PT [phone] 09/05/2012

## 2012-09-05 NOTE — Progress Notes (Signed)
Met with patient to discuss ?SNF- he and his wife at bedside are agreeable to this and prefer [name] as [facility] has been there in the past-  FL2 and clinicals submitted and faxed to SNF's- will advise as offers are rec'd for bed selection.  Reece Levy, MSW [phone]

## 2012-09-05 NOTE — Progress Notes (Signed)
Report called by night rn, transfer pt via bed at this time with belongings.

## 2012-09-05 NOTE — Anesthesia Postprocedure Evaluation (Signed)
  Anesthesia Post-op Note  Patient: [name]  Procedure(s) Performed: Procedure(s): IRRIGATION AND DEBRIDEMENT RIGHT HIP (Right) REVISION OF POLYETHYLENE LINER AND FEMORAL HEAD  (Right)  Patient Location: 6E 19C  Anesthesia Type:General  Level of Consciousness: awake and alert   Airway and Oxygen Therapy: Patient Spontanous Breathing  Post-op Pain: mild  Post-op Assessment: Post-op Vital signs reviewed, Patient's Cardiovascular Status Stable, Respiratory Function Stable, Patent Airway and No signs of Nausea or vomiting  Post-op Vital Signs: Reviewed and stable  Complications: No apparent anesthesia complications

## 2012-09-06 LAB — CBC
MCH: 30.4 pg (ref 26.0–34.0)
MCHC: 34.3 g/dL (ref 30.0–36.0)
Platelets: 343 10*3/uL (ref 150–400)
RBC: 3.35 MIL/uL — ABNORMAL LOW (ref 4.22–5.81)

## 2012-09-06 LAB — GLUCOSE, CAPILLARY
Glucose-Capillary: 86 mg/dL (ref 70–99)
Glucose-Capillary: 152 mg/dL — ABNORMAL HIGH (ref 70–99)
Glucose-Capillary: 189 mg/dL — ABNORMAL HIGH (ref 70–99)
Glucose-Capillary: 114 mg/dL — ABNORMAL HIGH (ref 70–99)

## 2012-09-06 LAB — TISSUE CULTURE
Culture: NO GROWTH
Gram Stain: NONE SEEN

## 2012-09-06 LAB — BASIC METABOLIC PANEL
Calcium: 9.2 mg/dL (ref 8.4–10.5)
GFR calc non Af Amer: 51 mL/min — ABNORMAL LOW (ref >90)
Potassium: 5.1 mEq/L (ref 3.5–5.1)
Sodium: 135 mEq/L (ref 135–145)

## 2012-09-06 MED ORDER — ACETAMINOPHEN 325 MG PO TABS: 325.0000 mg | ORAL_TABLET | ORAL | Status: DC | PRN

## 2012-09-06 MED ORDER — ZOLPIDEM TARTRATE 5 MG PO TABS
5.0000 mg | ORAL_TABLET | Freq: Every evening | ORAL | Status: DC | PRN
  Administered 2012-09-06: 5 mg via ORAL
  Filled 2012-09-06: qty 1

## 2012-09-06 MED ORDER — HYDROCODONE-ACETAMINOPHEN 5-325 MG PO TABS: 1.0000 | ORAL_TABLET | ORAL | Status: DC | PRN

## 2012-09-06 NOTE — Progress Notes (Signed)
[facility]   [name] ZHY:[id number] DOB: 02/26/1930 DOA: 08/25/2012 PCP: Malka So., MD  Brief Clinical Summary / HPI: 79 y.o. male with pacemaker, R prosthetic hip, and laminectomy in January of 2014 admitted with R leg pain, weakness, and fevers. He was subsequently found to have MSSA bacteremia and a large retroperitoneal abscess adjacent to his prosthetic hip.  Orthopedics performed irrigation and debridement right total hip with removal of all synovium, revision of femoral head, and revision of polyethylene liner placement on 09/02/12.  A TEE failed to show vegetations on valves or overtly on PM. PM was removed by EP.  Assessment/Plan:  MSSA bacteremia  -PICC line if 09/01/12 blood culture remains neg x 5 days  -rifamipin started 09/01/12, IV cefazolin started 08/28/2012 - abx per ID Team- recommend at least 6 weeks of these 2 Abx with tentative stop date 10/13/12 followed by protracted course of PO Abx. -infected retroperitoneal fluid collection-->MSSA  -TEE--no vegetations  -08/31/12 PPM removed--Dr. Ladona Ridgel  -09/01/12 aspirate of iliotibial band--MSSA  -s/p radical irrigation and debridement right total hip with removal of all synovium, revision of femoral head, revision of polyethylene liner placement of large bore Hemovac drains 09/02/2012 - Dr. Turner Daniels. Still has 2 JP drains in right hip.  Infected retroperitoneal hematoma and infected R-hip collection  -CT of the hip and CT of the abdomen and pelvis worrisome for complex fluid collection around the right hip and also retroperitoneal hematoma -appreciate orthopedic follow up - I&D and exploration 09/02/12  -Interventional radiology s/p aspiration and drainage or retroperitoneal hematoma 08/29/12.  -09/01/12 IR aspiration of IT band-->S.aureus  -09/02/12--removal of all synovium, revision of femoral head, and polyethylene liner replacement by Dr. Turner Daniels-- follow culture data -discussed with Dr. Cyndia Skeeters limited to transfer from  bed to chair only - RP drains removed ? 8/12 AM.   supratherapeutic INR/Afib  -INR 7.19 on day of admission  -Status post vitamin K doses x2 and FFP  -CHADS-VasC= 3-->in light of the patient's retroperitoneal hematoma, Dr. Arbutus Leas discussed the risks, benefits, and alternatives of ultimately restarting anticoagulation with warfarin versus aspirin when the patient is stable  -family and patient decided on aspirin  right lower extremity weakness and back pain  Likely secondary to infected retroperitoneal hematoma and possible complex fluid collection noted around the right hip. Improved. PT & OT  acute renal failure/dehydration/hypokalemia  -renal function is stable at this time  -Likely combination of hypovolemia and ATN  - Creatinine 1.29 - Mild Hyperkalemia- will FU BMP in am. Already received KCl today. Hold KCl & FU.  atrial fibrillation  Continue beta blocker for rate control. INR has been reversed  -Patient is status post permanent pacemaker- removed/infected.  -09/02/2012--patient was on a diltiazem drip postoperatively - changed diltiazem to a long-acting formulation - HR remains well controlled at this time   normocytic anemia  stable  DM2 CBG currently well controlled - no change in tx plan today   Hyponatremia -Likely due to hypotonic fluid infusion in the last 24 hours -improving with d/c of IV free water  - resolved.  Insomnia - patient requesting something to sleep at night. Low dose Ambien.  Family Communication: no family present at time of exam  Disposition Plan: SNF when medically stable & cleared by specialists.  Antibiotics:  IV vancomycin 08/26/2012--->08/29/12  IV Rocephin 08/26/2012--->08/27/12  IV Ancef 08/28/2012-->  Rifampin 09/01/12-->  Subjective: Unable to sleep last night and asking for something. Denies pain or complaints.  Objective: Filed Vitals:   09/05/12 1800  09/05/12 2200 09/06/12 0507 09/06/12 1000  BP: 121/69 134/75 136/66 123/70   Pulse: 102 87 91 88  Temp: 98.1 F (36.7 C) 98.5 F (36.9 C) 98.1 F (36.7 C) 98.7 F (37.1 C)  TempSrc: Oral Oral Oral Oral  Resp: 18 18 18 18   Height:      Weight:  110.8 kg (244 lb 4.3 oz)    SpO2: 98% 97% 97% 99%    Intake/Output Summary (Last 24 hours) at 09/06/12 1131 Last data filed at 09/06/12 1032  Gross per 24 hour  Intake    480 ml  Output   1147 ml  Net   -667 ml   Exam: General: No acute respiratory distress at rest  Lungs: Clear to auscultation. No increased work on breathing. Cardiovascular: distant HS - regular rate - no appreciable gallup, rub, or M Abdomen: nondistended, soft, bowel sounds positive, no rebound, no ascites, no appreciable mass, multiple drains pierce the R LW with no signif erythema or d/c at the puncture sites  Extremities: No significant cyanosis, clubbing;  trace edema bilateral lower extremities  Data Reviewed: Basic Metabolic Panel:  Recent Labs Lab 09/02/12 0540 09/03/12 0530 09/04/12 0440 09/05/12 0350 09/06/12 0558  NA 132* 132* 129* 133* 135  K 3.8 4.6 4.0 3.7 5.1  CL 94* 97 92* 97 97  CO2 28 28 31 28 26   GLUCOSE 86 141* 196* 106* 104*  BUN 19 20 16 17 21   CREATININE 1.15 1.41* 1.27 1.34 1.29  CALCIUM 9.0 8.5 8.5 8.6 9.2   Liver Function Tests: No results found for this basename: AST, ALT, ALKPHOS, BILITOT, PROT, ALBUMIN,  in the last 168 hours  CBC:  Recent Labs Lab 09/02/12 0540 09/03/12 0530 09/04/12 0440 09/05/12 0350 09/06/12 0558  WBC 12.5* 15.6* 15.0* 11.3* 10.3  HGB 10.5* 10.8* 10.9* 9.9* 10.2*  HCT 32.1* 31.7* 31.5* 29.2* 29.7*  MCV 89.7 88.1 88.0 88.5 88.7  PLT 310 303 305 313 343   CBG:  Recent Labs Lab 09/05/12 0755 09/05/12 1215 09/05/12 1812 09/05/12 2055 09/06/12 0728  GLUCAP 102* 149* 134* 124* 86    Recent Results (from the past 240 hour(s))  CULTURE, BLOOD (ROUTINE X 2)     Status: None   Collection Time    08/28/12 12:45 AM      Result Value Range Status   Specimen  Description BLOOD RIGHT HAND   Final   Special Requests BOTTLES DRAWN AEROBIC AND ANAEROBIC 10CC   Final   Culture  Setup Time     Final   Value: 08/28/2012 15:05     Performed at [facility]   Culture     Final   Value: NO GROWTH 5 DAYS     Performed at [facility]   Report Status 09/03/2012 FINAL   Final  CULTURE, BLOOD (ROUTINE X 2)     Status: None   Collection Time    08/28/12 12:55 AM      Result Value Range Status   Specimen Description BLOOD RIGHT HAND   Final   Special Requests BOTTLES DRAWN AEROBIC ONLY 10CC   Final   Culture  Setup Time     Final   Value: 08/28/2012 15:05     Performed at [facility]   Culture     Final   Value: NO GROWTH 5 DAYS     Performed at [facility]   Report Status 09/03/2012 FINAL   Final  CULTURE, ROUTINE-ABSCESS  Status: None   Collection Time    08/29/12  3:01 PM      Result Value Range Status   Specimen Description ABSCESS   Final   Special Requests RIGHT RETROPERITONEAL HEMATOMA   Final   Gram Stain     Final   Value: MODERATE WBC PRESENT, PREDOMINANTLY PMN     NO SQUAMOUS EPITHELIAL CELLS SEEN     MODERATE GRAM POSITIVE COCCI IN CLUSTERS     Performed at [facility]   Culture     Final   Value: ABUNDANT STAPHYLOCOCCUS AUREUS     Note: RIFAMPIN AND GENTAMICIN SHOULD NOT BE USED AS SINGLE DRUGS FOR TREATMENT OF STAPH INFECTIONS.     Performed at [facility]   Report Status 09/01/2012 FINAL   Final   Organism ID, Bacteria STAPHYLOCOCCUS AUREUS   Final  ANAEROBIC CULTURE     Status: None   Collection Time    08/29/12  3:01 PM      Result Value Range Status   Specimen Description ABSCESS   Final   Special Requests RIGHT RETROPERITONEAL HEMATOMA   Final   Gram Stain     Final   Value: MODERATE WBC PRESENT, PREDOMINANTLY PMN     NO SQUAMOUS EPITHELIAL CELLS SEEN     M0 GRAM POSITIVE COCCI     IN PAIRS INCISION     Performed at [facility]   Culture     Final    Value: NO ANAEROBES ISOLATED     Performed at [facility]   Report Status 09/03/2012 FINAL   Final  SURGICAL PCR SCREEN     Status: None   Collection Time    08/31/12  7:00 AM      Result Value Range Status   MRSA, PCR NEGATIVE  NEGATIVE Final   Staphylococcus aureus NEGATIVE  NEGATIVE Final   Comment:            The Xpert SA Assay (FDA     approved for NASAL specimens     in patients over 68 years of age),     is one component of     a comprehensive surveillance     program.  Test performance has     been validated by [employer] for patients greater     than or equal to 58 year old.     It is not intended     to diagnose infection nor to     guide or monitor treatment.  CULTURE, BLOOD (ROUTINE X 2)     Status: None   Collection Time    09/01/12  6:30 AM      Result Value Range Status   Specimen Description BLOOD RIGHT HAND   Final   Special Requests     Final   Value: BOTTLES DRAWN AEROBIC AND ANAEROBIC 10CC AEROBIC 6CC ANAEROBIC   Culture  Setup Time     Final   Value: 09/01/2012 11:05     Performed at [facility]   Culture     Final   Value:        BLOOD CULTURE RECEIVED NO GROWTH TO DATE CULTURE WILL BE HELD FOR 5 DAYS BEFORE ISSUING A FINAL NEGATIVE REPORT     Performed at [facility]   Report Status PENDING   Incomplete  CULTURE, BLOOD (ROUTINE X 2)     Status: None   Collection Time    09/01/12  6:35 AM      Result Value Range Status   Specimen Description BLOOD LEFT HAND   Final   Special Requests BOTTLES DRAWN AEROBIC ONLY 10CC   Final   Culture  Setup Time     Final   Value: 09/01/2012 11:05     Performed at [facility]   Culture     Final   Value:        BLOOD CULTURE RECEIVED NO GROWTH TO DATE CULTURE WILL BE HELD FOR 5 DAYS BEFORE ISSUING A FINAL NEGATIVE REPORT     Performed at [facility]   Report Status PENDING   Incomplete  ANAEROBIC CULTURE     Status: None   Collection Time    09/01/12  9:08 AM       Result Value Range Status   Specimen Description ABSCESS HIP RIGHT   Final   Special Requests RIGHT HIP MUSCULAR ABSCESS   Final   Gram Stain     Final   Value: ABUNDANT WBC PRESENT,BOTH PMN AND MONONUCLEAR     MODERATE GRAM POSITIVE COCCI     IN PAIRS IN CLUSTERS Performed at [facility]     Performed at [facility]   Culture     Final   Value: NO ANAEROBES ISOLATED     Performed at [facility]   Report Status 09/06/2012 FINAL   Final  GRAM STAIN     Status: None   Collection Time    09/01/12  9:08 AM      Result Value Range Status   Specimen Description ABSCESS HIP RIGHT   Final   Special Requests RIGHT HIP MUSCULAR ABSCESS   Final   Gram Stain     Final   Value: ABUNDANT WBC PRESENT,BOTH PMN AND MONONUCLEAR     MODERATE GRAM POSITIVE COCCI IN PAIRS IN CLUSTERS     CALLED TO A RODRIGUEZ,RN 09/01/12 0955 BY K SCHULTZ   Report Status 09/01/2012 FINAL   Final  CULTURE, ROUTINE-ABSCESS     Status: None   Collection Time    09/01/12  9:08 AM      Result Value Range Status   Specimen Description ABSCESS HIP RIGHT   Final   Special Requests RIGHT HIP MUSCULAR ABSCESS   Final   Gram Stain     Final   Value: ABUNDANT WBC PRESENT,BOTH PMN AND MONONUCLEAR     MODERATE GRAM POSITIVE COCCI     IN PAIRS IN CLUSTERS Performed at [facility]     Performed at [facility]   Culture     Final   Value: FEW STAPHYLOCOCCUS AUREUS     Note: RIFAMPIN AND GENTAMICIN SHOULD NOT BE USED AS SINGLE DRUGS FOR TREATMENT OF STAPH INFECTIONS.     Performed at [facility]   Report Status 09/04/2012 FINAL   Final   Organism ID, Bacteria STAPHYLOCOCCUS AUREUS   Final  TISSUE CULTURE     Status: None   Collection Time    09/02/12  2:15 PM      Result Value Range Status   Specimen Description TISSUE RIGHT LEG   Final   Special Requests POF ANCEF POSTERIOR HIP CAPSULE   Final   Gram Stain     Final   Value: NO WBC SEEN     NO ORGANISMS SEEN      Performed at [facility]   Culture     Final   Value:  NO GROWTH 3 DAYS     Performed at [facility]   Report Status 09/06/2012 FINAL   Final     Scheduled Meds: . aspirin  325 mg Oral Daily  .  ceFAZolin (ANCEF) IV  2 g Intravenous Q8H  . diltiazem  120 mg Oral Daily  . docusate sodium  100 mg Oral BID  . finasteride  5 mg Oral Daily  . insulin aspart  0-9 Units Subcutaneous TID WC  . levothyroxine  75 mcg Oral QAC breakfast  . metolazone  2.5 mg Oral Q M,W,F  . metoprolol succinate  12.5 mg Oral Daily  . potassium chloride  40 mEq Oral Daily  . rifampin  300 mg Oral Q12H    [facility] Office  [phone] Pager [phone]  On-Call/Text Page:      [email or web address]      password [facility]  09/06/2012, 11:31 AM   LOS: 12 days

## 2012-09-06 NOTE — Progress Notes (Signed)
Patient ID: [name], male   DOB: 1930-10-10, 79 y.o.   MRN: [id number] S: Patient ambulating bed to chair without difficulty, reports his hip pain is down to 2/10, reviewing the chart was like the Hemovac drains put out essentially nothing over the last 24 hours. The drains in the retroperitoneum also have been relatively quiet.  O: The patient is afebrile, his white count is now down to 10,000, deep tissue cultures from the I&D last Friday are negative regarding the hip joint pseudocapsule. I went ahead and clipped at the stay sutures and removed the 2 large bore Hemovac drains from his hip without difficulty and placed a new Mepilex dressing over the hip wound which was clean and dry.  A: Doing well after percutaneous drainage of retroperitoneal abscess, as well as radical irrigation and debridement of right total up arthroplasty with polyethylene and femoral head exchange. The patient's grown MSSA from his blood and from the retroperitoneum, deep tissue cultures from the right hip have remained negative and the tissue had a fairly normal appearance at surgery  P: We will advance physical therapy ambulation, IV antibiotics transitioned to by mouth antibiotics per infectious diseases. Removal retroperitoneal drains will be per general surgery.

## 2012-09-06 NOTE — Progress Notes (Signed)
Utilization review completed.  

## 2012-09-06 NOTE — Progress Notes (Signed)
ANTIBIOTIC CONSULT NOTE - FOLLOW UP  Pharmacy Consult for Cefazolin Indication: MSSA Bacteremia, RP abscess, R-hip abscess.  No Known Allergies  Patient Measurements: Height: 5' 11.5" (181.6 cm) Weight: 244 lb 4.3 oz (110.8 kg) IBW/kg (Calculated) : 76.45  Vital Signs: Temp: 98.7 F (37.1 C) (08/12 1000) Temp src: Oral (08/12 1000) BP: 123/70 mmHg (08/12 1000) Pulse Rate: 88 (08/12 1000) Intake/Output from previous day: 08/11 0701 - 08/12 0700 In: 480 [P.O.:480] Out: 950 [Urine:950] Intake/Output from this shift: Total I/O In: 240 [P.O.:240] Out: 397 [Urine:375; Drains:22]  Labs:  Recent Labs  09/04/12 0440 09/05/12 0350 09/06/12 0558  WBC 15.0* 11.3* 10.3  HGB 10.9* 9.9* 10.2*  PLT 305 313 343  CREATININE 1.27 1.34 1.29   Estimated Creatinine Clearance: 58.3 ml/min (by C-G formula based on Cr of 1.29). No results found for this basename: VANCOTROUGH, Leodis Binet, VANCORANDOM, GENTTROUGH, GENTPEAK, GENTRANDOM, TOBRATROUGH, TOBRAPEAK, TOBRARND, AMIKACINPEAK, AMIKACINTROU, AMIKACIN,  in the last 72 hours   Microbiology: Recent Results (from the past 720 hour(s))  URINE CULTURE     Status: None   Collection Time    08/26/12  7:32 AM      Result Value Range Status   Specimen Description URINE, CLEAN CATCH   Final   Special Requests NONE   Final   Culture  Setup Time 08/26/2012 21:40   Final   Colony Count >=100,000 COLONIES/ML   Final   Culture KLEBSIELLA OZAENAE   Final   Report Status 08/29/2012 FINAL   Final   Organism ID, Bacteria KLEBSIELLA OZAENAE   Final  CULTURE, BLOOD (ROUTINE X 2)     Status: None   Collection Time    08/26/12 11:20 AM      Result Value Range Status   Specimen Description BLOOD LEFT ARM   Final   Special Requests BOTTLES DRAWN AEROBIC AND ANAEROBIC 10CC   Final   Culture  Setup Time 08/26/2012 16:54   Final   Culture     Final   Value: STAPHYLOCOCCUS AUREUS     Note: SUSCEPTIBILITIES PERFORMED ON PREVIOUS CULTURE WITHIN THE LAST 5  DAYS.     2 Note: Gram Stain Report Called to,Read Back By and Verified With: KELSEY [place] RN ON8 14 @420  EDMOJ   Report Status 08/29/2012 FINAL   Final  CULTURE, BLOOD (ROUTINE X 2)     Status: None   Collection Time    08/26/12 11:30 AM      Result Value Range Status   Specimen Description BLOOD LEFT HAND   Final   Special Requests     Final   Value: BOTTLES DRAWN AEROBIC AND ANAEROBIC AERO 10CC ANA 5CC   Culture  Setup Time 08/26/2012 16:54   Final   Culture     Final   Value: STAPHYLOCOCCUS AUREUS     Note: RIFAMPIN AND GENTAMICIN SHOULD NOT BE USED AS SINGLE DRUGS FOR TREATMENT OF STAPH INFECTIONS.     2 Note: Gram Stain Report Called to,Read Back By and Verified With: KELSEY [place] RN ON8 14 @420  EDMOJ   Report Status 08/29/2012 FINAL   Final   Organism ID, Bacteria STAPHYLOCOCCUS AUREUS   Final  CULTURE, BLOOD (ROUTINE X 2)     Status: None   Collection Time    08/28/12 12:45 AM      Result Value Range Status   Specimen Description BLOOD RIGHT HAND   Final   Special Requests BOTTLES DRAWN AEROBIC AND ANAEROBIC 10CC   Final   Culture  Setup Time     Final   Value: 08/28/2012 15:05     Performed at [facility]   Culture     Final   Value: NO GROWTH 5 DAYS     Performed at [facility]   Report Status 09/03/2012 FINAL   Final  CULTURE, BLOOD (ROUTINE X 2)     Status: None   Collection Time    08/28/12 12:55 AM      Result Value Range Status   Specimen Description BLOOD RIGHT HAND   Final   Special Requests BOTTLES DRAWN AEROBIC ONLY 10CC   Final   Culture  Setup Time     Final   Value: 08/28/2012 15:05     Performed at [facility]   Culture     Final   Value: NO GROWTH 5 DAYS     Performed at [facility]   Report Status 09/03/2012 FINAL   Final  CULTURE, ROUTINE-ABSCESS     Status: None   Collection Time    08/29/12  3:01 PM      Result Value Range Status   Specimen Description ABSCESS   Final   Special Requests RIGHT  RETROPERITONEAL HEMATOMA   Final   Gram Stain     Final   Value: MODERATE WBC PRESENT, PREDOMINANTLY PMN     NO SQUAMOUS EPITHELIAL CELLS SEEN     MODERATE GRAM POSITIVE COCCI IN CLUSTERS     Performed at [facility]   Culture     Final   Value: ABUNDANT STAPHYLOCOCCUS AUREUS     Note: RIFAMPIN AND GENTAMICIN SHOULD NOT BE USED AS SINGLE DRUGS FOR TREATMENT OF STAPH INFECTIONS.     Performed at [facility]   Report Status 09/01/2012 FINAL   Final   Organism ID, Bacteria STAPHYLOCOCCUS AUREUS   Final  ANAEROBIC CULTURE     Status: None   Collection Time    08/29/12  3:01 PM      Result Value Range Status   Specimen Description ABSCESS   Final   Special Requests RIGHT RETROPERITONEAL HEMATOMA   Final   Gram Stain     Final   Value: MODERATE WBC PRESENT, PREDOMINANTLY PMN     NO SQUAMOUS EPITHELIAL CELLS SEEN     M0 GRAM POSITIVE COCCI     IN PAIRS INCISION     Performed at [facility]   Culture     Final   Value: NO ANAEROBES ISOLATED     Performed at [facility]   Report Status 09/03/2012 FINAL   Final  SURGICAL PCR SCREEN     Status: None   Collection Time    08/31/12  7:00 AM      Result Value Range Status   MRSA, PCR NEGATIVE  NEGATIVE Final   Staphylococcus aureus NEGATIVE  NEGATIVE Final   Comment:            The Xpert SA Assay (FDA     approved for NASAL specimens     in patients over 77 years of age),     is one component of     a comprehensive surveillance     program.  Test performance has     been validated by [employer] for patients greater     than or equal to 42 year old.     It is not intended     to diagnose infection nor  to     guide or monitor treatment.  CULTURE, BLOOD (ROUTINE X 2)     Status: None   Collection Time    09/01/12  6:30 AM      Result Value Range Status   Specimen Description BLOOD RIGHT HAND   Final   Special Requests     Final   Value: BOTTLES DRAWN AEROBIC AND ANAEROBIC 10CC AEROBIC 6CC  ANAEROBIC   Culture  Setup Time     Final   Value: 09/01/2012 11:05     Performed at [facility]   Culture     Final   Value:        BLOOD CULTURE RECEIVED NO GROWTH TO DATE CULTURE WILL BE HELD FOR 5 DAYS BEFORE ISSUING A FINAL NEGATIVE REPORT     Performed at [facility]   Report Status PENDING   Incomplete  CULTURE, BLOOD (ROUTINE X 2)     Status: None   Collection Time    09/01/12  6:35 AM      Result Value Range Status   Specimen Description BLOOD LEFT HAND   Final   Special Requests BOTTLES DRAWN AEROBIC ONLY 10CC   Final   Culture  Setup Time     Final   Value: 09/01/2012 11:05     Performed at [facility]   Culture     Final   Value:        BLOOD CULTURE RECEIVED NO GROWTH TO DATE CULTURE WILL BE HELD FOR 5 DAYS BEFORE ISSUING A FINAL NEGATIVE REPORT     Performed at [facility]   Report Status PENDING   Incomplete  ANAEROBIC CULTURE     Status: None   Collection Time    09/01/12  9:08 AM      Result Value Range Status   Specimen Description ABSCESS HIP RIGHT   Final   Special Requests RIGHT HIP MUSCULAR ABSCESS   Final   Gram Stain     Final   Value: ABUNDANT WBC PRESENT,BOTH PMN AND MONONUCLEAR     MODERATE GRAM POSITIVE COCCI     IN PAIRS IN CLUSTERS Performed at [facility]     Performed at [facility]   Culture     Final   Value: NO ANAEROBES ISOLATED     Performed at [facility]   Report Status 09/06/2012 FINAL   Final  GRAM STAIN     Status: None   Collection Time    09/01/12  9:08 AM      Result Value Range Status   Specimen Description ABSCESS HIP RIGHT   Final   Special Requests RIGHT HIP MUSCULAR ABSCESS   Final   Gram Stain     Final   Value: ABUNDANT WBC PRESENT,BOTH PMN AND MONONUCLEAR     MODERATE GRAM POSITIVE COCCI IN PAIRS IN CLUSTERS     CALLED TO A RODRIGUEZ,RN 09/01/12 0955 BY K SCHULTZ   Report Status 09/01/2012 FINAL   Final  CULTURE, ROUTINE-ABSCESS     Status: None   Collection  Time    09/01/12  9:08 AM      Result Value Range Status   Specimen Description ABSCESS HIP RIGHT   Final   Special Requests RIGHT HIP MUSCULAR ABSCESS   Final   Gram Stain     Final   Value: ABUNDANT WBC PRESENT,BOTH PMN AND MONONUCLEAR     MODERATE GRAM POSITIVE COCCI  IN PAIRS IN CLUSTERS Performed at [facility]     Performed at [facility]   Culture     Final   Value: FEW STAPHYLOCOCCUS AUREUS     Note: RIFAMPIN AND GENTAMICIN SHOULD NOT BE USED AS SINGLE DRUGS FOR TREATMENT OF STAPH INFECTIONS.     Performed at [facility]   Report Status 09/04/2012 FINAL   Final   Organism ID, Bacteria STAPHYLOCOCCUS AUREUS   Final  TISSUE CULTURE     Status: None   Collection Time    09/02/12  2:15 PM      Result Value Range Status   Specimen Description TISSUE RIGHT LEG   Final   Special Requests POF ANCEF POSTERIOR HIP CAPSULE   Final   Gram Stain     Final   Value: NO WBC SEEN     NO ORGANISMS SEEN     Performed at [facility]   Culture     Final   Value: NO GROWTH 3 DAYS     Performed at [facility]   Report Status 09/06/2012 FINAL   Final    Anti-infectives   Start     Dose/Rate Route Frequency Ordered Stop   09/05/12 1415  ceFAZolin (ANCEF) IVPB 2 g/50 mL premix     2 g 100 mL/hr over 30 Minutes Intravenous 3 times per day 09/05/12 1339     09/02/12 1400  ceFAZolin (ANCEF) IVPB 1 g/50 mL premix  Status:  Discontinued     1 g 100 mL/hr over 30 Minutes Intravenous To Surgery 09/02/12 1358 09/02/12 2121   09/01/12 1000  rifampin (RIFADIN) capsule 300 mg     300 mg Oral Every 12 hours 08/31/12 2038     08/31/12 0930  gentamicin (GARAMYCIN) 80 mg in sodium chloride irrigation 0.9 % 500 mL irrigation  Status:  Discontinued       As needed 08/31/12 0931 08/31/12 1004   08/31/12 0630  gentamicin (GARAMYCIN) 80 mg in sodium chloride irrigation 0.9 % 500 mL irrigation  Status:  Discontinued     80 mg Irrigation On call 08/31/12 0627  08/31/12 1149   08/31/12 0630  ceFAZolin (ANCEF) IVPB 2 g/50 mL premix     2 g 100 mL/hr over 30 Minutes Intravenous On call 08/31/12 0627 08/31/12 0913   08/28/12 1400  ceFAZolin (ANCEF) IVPB 1 g/50 mL premix  Status:  Discontinued     1 g 100 mL/hr over 30 Minutes Intravenous 3 times per day 08/28/12 1136 09/05/12 1339   08/28/12 0400  vancomycin (VANCOCIN) 1,500 mg in sodium chloride 0.9 % 500 mL IVPB  Status:  Discontinued     1,500 mg 250 mL/hr over 120 Minutes Intravenous Every 24 hours 08/27/12 0431 08/29/12 1425   08/27/12 0445  vancomycin (VANCOCIN) 2,000 mg in sodium chloride 0.9 % 500 mL IVPB     2,000 mg 250 mL/hr over 120 Minutes Intravenous  Once 08/27/12 0431 08/27/12 0722   08/26/12 1000  cefTRIAXone (ROCEPHIN) 1 g in dextrose 5 % 50 mL IVPB  Status:  Discontinued     1 g 100 mL/hr over 30 Minutes Intravenous Every 24 hours 08/26/12 0913 08/27/12 1610      Assessment: 79 y.o. M who continues on Ancef for MSSA bacteremia, retroperitoneal abscess, and R-hip abscess now s/p radical I&D of R-THA. TEE negative for vegetations. Per ID, to continue Ancef + Rifampin for a minimum of 6 weeks post ortho procedure.  Dose  increased to 2g every 8 hours by ID on 8/11 -- appropriate. Renal function stable, SCr 1.29, CrCl~50-60 ml/min.   Goal of Therapy:  Proper antibiotics for infection/cultures adjusted for renal/hepatic function   Plan:  1. Continue Ancef 2g IV every 8 hours 2. Will continue to follow renal function, culture results, LOT, and antibiotic de-escalation plans   Georgina Pillion, PharmD, BCPS Clinical Pharmacist Pager: [phone] 09/06/2012 11:57 AM

## 2012-09-06 NOTE — Progress Notes (Signed)
Patient ID: [name], male   DOB: [birth year], 79 y.o.   MRN: [id number]  Request made to remove RP collection/hematoma drains  Pt: afeb; wbc wnl Cx: staph- on Ancef Output minimal x 2-3 days Today output 7cc and 15 cc.  Pt up in chair; feeling better daily  Discussed with Dr Deanne Coffer and Dr Turner Daniels  RLQ drain removal in room without complication New dressing applied Tolerated well.

## 2012-09-06 NOTE — Progress Notes (Signed)
Physical Therapy Treatment Patient Details Name: [name] MRN: [id number] DOB: 1930-09-30 Today's Date: 09/06/2012 Time: 4098-1191 PT Time Calculation (min): 23 min  PT Assessment / Plan / Recommendation  History of Present Illness [name] is a 79 y.o. male with  Pacemaker, Right prosthetic hip, Laminectomy in January of 2014 admitted with right leg pain weakness fevers. He has been found to have MSSA bacteremia and a large retroperitoneal abscess that is adjacent to his prosthetic hip.   Pt with s/p I & D right hip with R THA revision.   PT Comments   Progressing to ambulation with orders per MD this date.  Feel significant right LE weakness contributes to difficulty with mobility.  Slight improved movement with exercises and focus on strengthening with eccentric extensor control.  Will need SNF level rehab at d/c.  Follow Up Recommendations  SNF;Supervision/Assistance - 24 hour     Does the patient have the potential to tolerate intense rehabilitation   N/A  Barriers to Discharge  None      Equipment Recommendations  None recommended by PT    Recommendations for Other Services  None  Frequency Min 3X/week   Progress towards PT Goals Progress towards PT goals: Progressing toward goals  Plan Current plan remains appropriate;Frequency needs to be updated    Precautions / Restrictions Precautions Precautions: Fall;Posterior Hip Restrictions RLE Weight Bearing: Weight bearing as tolerated   Pertinent Vitals/Pain Mild tenderness proximal to right knee    Mobility  Bed Mobility Supine to Sit: 2: Max assist Sitting - Scoot to [place] of Bed: 4: Min assist Details for Bed Mobility Assistance: able to scoot to edge of bed in supine with mod assist, needing max assist to lift trunk upright Transfers Sit to Stand: From bed;1: +2 Total assist Sit to Stand: Patient Percentage: 50% Stand to Sit: To chair/3-in-1;1: +2 Total assist;With armrests Stand to Sit: Patient  Percentage: 60% Ambulation/Gait Ambulation/Gait Assistance: 3: Mod assist Ambulation Distance (Feet): 6 Feet Assistive device: Rolling walker Ambulation/Gait Assistance Details: cues for weight through UE's to increase left step length, fatigued quickly with ambulation Gait Pattern: Step-to pattern;Decreased stride length;Decreased weight shift to right;Decreased stance time - right;Decreased step length - left;Antalgic;Shuffle    Exercises Total Joint Exercises Ankle Circles/Pumps: AROM;Both;10 reps;Supine Short Arc Quad: AAROM;Right;10 reps;Supine (focus on control with eccentric lowering) Heel Slides: AAROM;Right;10 reps;Supine;Left;AROM     PT Goals (current goals can now be found in the care plan section)    Visit Information  Last PT Received On: 09/06/12 History of Present Illness: [name] is a 79 y.o. male with  Pacemaker, Right prosthetic hip, Laminectomy in January of 2014 admitted with right leg pain weakness fevers. He has been found to have MSSA bacteremia and a large retroperitoneal abscess that is adjacent to his prosthetic hip.   Pt with s/p I & D right hip with R THA revision.    Subjective Data   Think I need to get the bedside pot.   Cognition  Cognition Arousal/Alertness: Awake/alert Behavior During Therapy: WFL for tasks assessed/performed Overall Cognitive Status: Within Functional Limits for tasks assessed    Balance  Static Sitting Balance Static Sitting - Balance Support: Feet supported;Bilateral upper extremity supported Static Sitting - Level of Assistance: 5: Stand by assistance Static Sitting - Comment/# of Minutes: 3 minutes with wife assist at edge of bed while retrieving equipment  End of Session PT - End of Session Equipment Utilized During Treatment: Gait belt Activity Tolerance: Patient limited by fatigue  Patient left: in chair;with call bell/phone within reach;with family/visitor present Nurse Communication: Mobility status;Other  (comment) (ordered BSC from portable equipment)   GP     [name],CYNDI 09/06/2012, 2:05 PM [name], PT [phone] 09/06/2012

## 2012-09-06 NOTE — Progress Notes (Signed)
Presented SNF bed offers to patient and he has selected [name] he has been before for SNF rehab. Updated MD and will await medical clearance for d/c- probably later in week-  Reece Levy, MSW [phone]

## 2012-09-07 ENCOUNTER — Inpatient Hospital Stay (HOSPITAL_COMMUNITY): Payer: Medicare Other

## 2012-09-07 DIAGNOSIS — I10 Essential (primary) hypertension: Secondary | ICD-10-CM

## 2012-09-07 LAB — CULTURE, BLOOD (ROUTINE X 2): Culture: NO GROWTH

## 2012-09-07 LAB — BASIC METABOLIC PANEL
GFR calc Af Amer: 58 mL/min — ABNORMAL LOW (ref >90)
GFR calc non Af Amer: 50 mL/min — ABNORMAL LOW (ref >90)
Potassium: 3.4 mEq/L — ABNORMAL LOW (ref 3.5–5.1)
Sodium: 132 mEq/L — ABNORMAL LOW (ref 135–145)

## 2012-09-07 LAB — GLUCOSE, CAPILLARY
Glucose-Capillary: 99 mg/dL (ref 70–99)
Glucose-Capillary: 153 mg/dL — ABNORMAL HIGH (ref 70–99)

## 2012-09-07 MED ORDER — POTASSIUM CHLORIDE CRYS ER 20 MEQ PO TBCR
40.0000 meq | EXTENDED_RELEASE_TABLET | Freq: Every day | ORAL | Status: DC
  Administered 2012-09-07 – 2012-09-08 (×2): 40 meq via ORAL
  Filled 2012-09-07 (×2): qty 2

## 2012-09-07 NOTE — Progress Notes (Signed)
Updated [name] that patient is awaiting a PICC placement prior to d/c- will f/u in the morning- Reece Levy, MSW [phone]

## 2012-09-07 NOTE — Discharge Summary (Addendum)
Physician Discharge Summary  [name] ZOX:[id number] DOB: 03-26-1930 DOA: 08/25/2012  PCP: Malka So., MD  Admit date: 08/25/2012 Discharge date: 09/07/2012  Time spent:66minutes  Recommendations for Outpatient Follow-up:  1. A. Fib; rate controlled. Not on anticoagulant secondary to his retroperitoneal bleed 2. Sepsis; continue Ancef dy4 + rifampin dy 8 patient to discharged with IV antibiotics for 6 weeks to [name]. We'll need to followup with his PCP, an orthopedic surgeon 3. Acute failure;  Cr= 1.31. Returning to normal   4. DM; hemoglobin A1c from 08/27/2012= 6.2 patient is within ADA guidelines  5. HTN; blood pressure slightly above AHA guidelines were given patient's age and fall risk would not attempt to lower further prior to discharge  6. Hypothyroidism; continue current meds we'll check a TSH/free T4 as patient recovers at approximately 6 weeks    Discharge Diagnoses:  Principal Problem:   Retroperitoneal fluid collection Active Problems:   HTN (hypertension)   CKD (chronic kidney disease), stage III   Atrial fibrillation   Hypothyroidism   HLD (hyperlipidemia)   ARF (acute renal failure)   Hypokalemia   Leukocytosis, unspecified   UTI (urinary tract infection)   Supratherapeutic INR   DM type 2 (diabetes mellitus, type 2)   Bacteremia   Fluid collection at surgical site   Discharge Condition:   Diet recommendation:   [facility]   08/25/12 2128 09/05/12 2200 09/06/12 2040  Weight: 110.723 kg (244 lb 1.6 oz) 110.8 kg (244 lb 4.3 oz) 105.552 kg (232 lb 11.2 oz)    History of present illness:  79 y.o. WM PMHx pacemaker, Rt prosthetic hip, and laminectomy in January of 2014 admitted with Rt leg pain, weakness, and fevers. He was subsequently found to have MSSA bacteremia and a large retroperitoneal abscess adjacent to his prosthetic hip. Orthopedics performed irrigation and debridement right total hip with removal of all  synovium, revision of femoral head, and revision of polyethylene liner placement on 09/02/12. A TEE which demonstrated no vegetation or endocarditis. YESTERDAY Rt PICC placed . (-) SOB, (-) CP TODAY still having some stinging when he urinates. Ready for discharge  .      Procedures:  TEE 09/02/2012 which demonstrated no vegetation or endocarditis  -s/p radical irrigation and debridement right total hip with removal of all synovium, revision of femoral head, revision of polyethylene liner placement of large bore Hemovac drains 09/02/2012 - Dr. Turner Daniels. Still has 2 JP drains in right hip.  -Patient is status post permanent pacemaker- removed/infected.     Consultations:  Orthopedic, cardiology, infectious disease  Discharge Exam: Filed Vitals:   09/06/12 1328 09/06/12 1707 09/06/12 2040 09/07/12 0603  BP: 121/74 103/62 132/74 130/67  Pulse: 83 89 94 96  Temp: 98.9 F (37.2 C) 98.7 F (37.1 C) 98.5 F (36.9 C) 99.1 F (37.3 C)  TempSrc: Oral Oral Oral Oral  Resp: 18 18 18 22   Height:      Weight:   105.552 kg (232 lb 11.2 oz)   SpO2: 99% 98% 94% 95%    General:Alert, NAD  Cardiac; irregular irregular rhythm and rate, negative murmurs rubs or gallops, DP/PT pulse 2+ Respiratory: Good auscultation bilateral Muscular skeletal; right PICC line placed, negative bleeding, erythema, signs of infection. Slight overlap chest removal of pacer clean covered negative sign infection    Discharge Instructions   Future Appointments Provider Department Dept Phone   10/03/2012 2:00 PM Randall Hiss, MD [facility] [phone]  Medication List    ASK your doctor about these medications       ALPRAZolam 0.5 MG tablet  Commonly known as:  XANAX  Take 1 tablet (0.5 mg total) by mouth daily as needed. For anxiety     diphenhydramine-acetaminophen 25-500 MG Tabs  Commonly known as:  TYLENOL PM  Take 1 tablet by mouth at bedtime as needed.      finasteride 5 MG tablet  Commonly known as:  PROSCAR  Take 5 mg by mouth daily.     furosemide 40 MG tablet  Commonly known as:  LASIX  Take 40 mg by mouth daily.     HYDROcodone-acetaminophen 5-500 MG per tablet  Commonly known as:  VICODIN  Take 1 tablet by mouth every 4 (four) hours as needed for pain.     levothyroxine 75 MCG tablet  Commonly known as:  SYNTHROID, LEVOTHROID  Take 75 mcg by mouth daily.     lovastatin 20 MG tablet  Commonly known as:  MEVACOR  Take 40 mg by mouth at bedtime.     metolazone 2.5 MG tablet  Commonly known as:  ZAROXOLYN  Take 2.5 mg by mouth every Monday, Wednesday, and Friday. As needed for fluid retention.     metoprolol succinate 25 MG 24 hr tablet  Commonly known as:  TOPROL-XL  Take 12.5 mg by mouth daily.     pioglitazone 15 MG tablet  Commonly known as:  ACTOS  Take 1 tablet by mouth daily.     terazosin 5 MG capsule  Commonly known as:  HYTRIN  Take 5 mg by mouth daily.     warfarin 4 MG tablet  Commonly known as:  COUMADIN  Take 1 tablet (4 mg total) by mouth daily. Hasn't taken since last Thursday due to the anticipation of the CT scan that took place today.  Usually takes daily       No Known Allergies    The results of significant diagnostics from this hospitalization (including imaging, microbiology, ancillary and laboratory) are listed below for reference.    Significant Diagnostic Studies: Ct Abdomen Pelvis Wo Contrast  08/28/2012   *RADIOLOGY REPORT*  Clinical Data: Evaluate abscess.  CT ABDOMEN AND PELVIS WITHOUT CONTRAST  Technique:  Multidetector CT imaging of the abdomen and pelvis was performed following the standard protocol without intravenous contrast.  Comparison: CT of the right hip from the same date.  Findings:  BODY WALL: Unremarkable.  LOWER CHEST:  Mediastinum: Single chamber right ventricular pacer.  Coronary artery atherosclerosis.  Lungs/pleura: Small bilateral pleural effusions and associated  atelectasis.  ABDOMEN/PELVIS:  Liver: No focal abnormality.  Biliary: Layering stones.  No evidence of acute cholecystitis.  Pancreas: Fatty atrophy.  Spleen: Unremarkable.  Adrenals: Unremarkable.  Kidneys and ureters: There are two exophytic right renal lesions in the interpolar region.  The largest measures 2.6 cm and is compatible with cyst - confirmed by recent sonography.  The second measures 15 mm and is soft tissue density. This was not definitely seen on prior ultrasound. This lesion may have grown mildly since imaging 01/20/2012.  No hydronephrosis or stone.  Bladder: Mild distension by urine.  Bowel:  No definite communication between the bowel and retroperitoneal collection.  Unfortunately, enteric contrast has not yet reached the colon at time of imaging.   Normal appendix.  Retroperitoneum:Extensive right retroperitoneal hematoma gas and fluid collection, involving the lower posterior pararenal space, the right iliacus muscle, proximal right rectus femoris, iliopsoas bursa.  The largest posterior  pararenal component is at the pelvic brim, measuring 9 x 4 by 6 cm.  The psoas muscle is spared.  No evidence of epidural extension of infection.  The neighboring osseous structures show no evidence of osteomyelitis.  Evaluation of the total right hip prosthesis was better accomplished on dedicated CT imaging. There is a relatively large collection posterior to the right femoral neck and greater trochanter, containing fluid and gas, measuring 7 x 3 x 3 cm. High density material present within the expanded right iliacus, measuring approximately 5 cm in diameter.  Peritoneum: No free fluid or gas.  Reproductive: Prostate enlargement calcifications.  Vascular: Extensive atherosclerotic calcification.  OSSEOUS: Total right hip prosthesis with surrounding fluid collections, as above.  Extensive degenerative disc disease with multilevel fusion and laminectomy.  No evidence of disc space infection. No suspicious  lytic or blastic lesions.  IMPRESSION:  1.  Extensive complex fluid collection, likely abscess, involving the lower right retroperitoneum and right periarticular hip - size and extent described above. Although there is extensive gas within the collection, no bowel communication identified.  Probable hematoma within the iliacus component.  2. Indeterminate, possibly solid 15 mm lesion in the interpolar right kidney.  Followup imaging advised.  3. Cholelithiasis.   Original Report Authenticated By: Tiburcio Pea   Dg Chest 2 View  09/01/2012   *RADIOLOGY REPORT*  Clinical Data: Interval removal of cardiac pacer apparatus.  CHEST - 2 VIEW  Comparison: Chest radiograph the 02/15/2012  Findings: Stable enlarged cardiac and mediastinal contours with calcification of the transverse thoracic aorta.  No consolidative pulmonary opacities.  No pleural effusion or pneumothorax. Mid thoracic spine degenerative change.  IMPRESSION: No acute cardiopulmonary process.   Original Report Authenticated By: Annia Belt, M.D   Dg Hip Complete Right  08/28/2012   *RADIOLOGY REPORT*  Clinical Data: 79 year old male with right hip pain.  RIGHT HIP - COMPLETE 2+ VIEW  Comparison: 11/26/2010 radiographs  Findings: Right total hip replacement again identified. There is no evidence of fracture, subluxation or dislocation. No complicating features are identified.  IMPRESSION: Right total hip replacement without acute abnormality.   Original Report Authenticated By: Harmon Pier, M.D.   Ct Lumbar Spine Wo Contrast  08/27/2012   *RADIOLOGY REPORT*  Clinical Data: Low back/right leg pain, fever, positive blood cultures  CT LUMBAR SPINE WITHOUT CONTRAST  Technique:  Multidetector CT imaging of the lumbar spine was performed without intravenous contrast administration. Multiplanar CT image reconstructions were also generated.  Comparison: CT myelogram dated 02/10/2012.  Findings: No evidence of fracture or dislocation.  Vertebral body heights are  maintained.  Postsurgical changes related to prior T12-L1 posterior laminectomy/discectomy and prior L4-S1 posterior laminectomy.  Prior L3-4 fusion.  Moderate to severe multilevel degenerative changes, unchanged from prior CT myelogram.  This includes mild to moderate central canal stenosis of the upper lumbar spine and moderate to severe central canal stenosis of the lower lumbar spine with nerve root encroachment at multiple levels.  No evidence of epidural fluid collection/abscess.  Complex fluid collection in the right abdomen/pelvis, incompletely visualized.  IMPRESSION: Postsurgical changes involving the lower thoracolumbar spine.  Stable moderate to severe multilevel degenerative changes, unchanged from prior CT myelogram.  No evidence of epidural fluid collection/abscess.  Complex fluid collection in the right abdomen/pelvis, incompletely visualized.  Please refer to CT right hip report for further assessment.   Original Report Authenticated By: Charline Bills, M.D.   US Abdomen Complete  08/26/2012   *RADIOLOGY REPORT*  Clinical Data:  79 year old with  abnormal liver function tests.  COMPLETE ABDOMINAL ULTRASOUND  Comparison:  Unenhanced CT abdomen and pelvis 102/28/202013 [facility].  Abdominal ultrasound 05/21/2011 [facility].  Findings:  Gallbladder:  Small shadowing gallstones.  Patient unable to turn into the lateral decubitus position to confirm movement of the stones, but these were present on the prior ultrasound examination. No gallbladder wall thickening or pericholecystic fluid.  Negative sonographic Murphy's sign according to the ultrasound technologist.  Common bile duct:  Normal in caliber with maximum diameter approximating 3 mm.  Liver:  Diffusely increased and coarsened echotexture without focal parenchymal abnormality.  Patent portal vein with hepatopetal flow.  IVC:  Patent in its intrahepatic portion.  Obscured outside the liver by bowel gas.  Pancreas:  Obscured by midline  bowel gas and therefore not evaluated.  Spleen:  Normal size and echotexture without focal parenchymal abnormality.  Right Kidney:  No hydronephrosis.  Well-preserved cortex for age. Cysts arising from the mid and lower pole as noted previously, the cyst arising from the mid kidney approximating 2.8 cm and the cyst arising from the lower pole approximating 3.1 cm.  No solid renal masses.  Normal parenchymal echotexture.  Approximately 12.0 cm in length.  Left Kidney:  No hydronephrosis.  Well-preserved cortex for age. Normal parenchymal echotexture.  No focal parenchymal abnormality. Approximately 10.7 cm in length.  Abdominal aorta:  Obscured by midline bowel gas and therefore not evaluated.  IMPRESSION:  1.  Cholelithiasis.  No sonographic evidence of acute cholecystitis.  No biliary ductal dilation. 2.  Diffuse hepatic steatosis without focal hepatic parenchymal abnormality. 3.  Midline structures (pancreas, extrahepatic IVC, and abdominal aorta) obscured by overlying bowel gas and therefore not evaluated.   Original Report Authenticated By: Hulan Saas, M.D.   Dg Pelvis Portable  09/02/2012   *RADIOLOGY REPORT*  Clinical Data: Revision of left total hip arthroplasty.  PORTABLE PELVIS  Comparison: Pelvic CT 08/29/2012 and 08/27/2012.  Findings: Two right-sided pelvic drains appear unchanged in position.  The patient is status post right total hip arthroplasty. Surgical drains, soft tissue emphysema and skin staples are in place consistent with recent revision.  The native left hip appears normal.  Degenerative changes are present throughout the spine.  IMPRESSION: No demonstrated complication following revision of RIGHT total hip arthroplasty.   Original Report Authenticated By: Carey Bullocks, M.D.   Ct Hip Right Wo Contrast  08/27/2012   *RADIOLOGY REPORT*  Clinical Data: Low back/right leg pain, fever, positive blood cultures  CT OF THE RIGHT HIP WITHOUT CONTRAST  Technique:  Multidetector CT imaging  was performed according to the standard protocol. Multiplanar CT image reconstructions were also generated.  Comparison: CT abdomen pelvis dated 102/28/202013.  Findings: Right total hip arthroplasty.  No evidence of fracture or dislocation.  Complex fluid collection/debris involving the right hip (series 5/image 36), right iliopsoas/iliacus muscles (series 5/images 9 and 23), and the visualized right lower retroperitoneum (series 5/image 1).  Representative axial measurements include: --3.8 x 8.3 cm in the right retroperitoneum (series 5/image 5) --9.2 x 5.6 cm in the right iliacus muscle (series 5/image 13) --12.7 x 7.3 cm along the right hip (series 5/image 37)  Based on this study, it is unclear whether this reflects a right hip infection which secondarily involves the lower abdomen/pelvis or whether this originated in the lower abdomen/pelvis and now extends to the right hip.  IMPRESSION: Right total hip arthroplasty.  Complex fluid collection/debris involving the right hip, right iliopsoas/iliacus muscles, and visualized right lower retroperitoneum, as described  above.  Based on this study, it is unclear whether this reflects a right hip infection which secondarily involves the lower abdomen/pelvis (favored) or whether this originated in the lower abdomen/pelvis and now extends to the right hip.  Consider CT abdomen/pelvis (preferably with half dose contrast) for further evaluation.  These results were called by telephone on 08/27/2012 at 2000 hrs to Dr. Tressie Stalker, who verbally acknowledged these results.   Original Report Authenticated By: Charline Bills, M.D.   Dg Chest Port 1 View  08/26/2012   *RADIOLOGY REPORT*  Clinical Data: Elevated white blood count  PORTABLE CHEST - 1 VIEW  Comparison: November 11, 2010.  Findings: Stable cardiomediastinal silhouette.  Left-sided pacemaker is unchanged in position.  No acute pulmonary disease is noted.  No pneumothorax or pleural effusion is noted.   Degenerative joint disease is seen involving both shoulders.  IMPRESSION: No acute cardiopulmonary abnormality seen.   Original Report Authenticated By: Lupita Raider.,  M.D.   Dg Abd Portable 2v  08/26/2012   *RADIOLOGY REPORT*  Clinical Data: Abdominal pain.  PORTABLE ABDOMEN - 2 VIEW  Comparison: [facility] images for CT scan of the lumbar spine dated 02/10/2012  Findings: There is no free air in the abdomen.  There is air scattered throughout nondistended loops of large and small bowel. No excessive stool in the colon.  Diffuse degenerative changes of the lumbar spine.  Right hip prosthesis.  IMPRESSION: No acute abnormalities.   Original Report Authenticated By: Francene Boyers, M.D.   Ct Image Guided Fluid Drain By Catheter  08/29/2012   *RADIOLOGY REPORT*  Clinical Data: Right-sided retroperitoneal fluid collections containing air.  CT GUIDED DRAINAGE OF RETROPERITONEAL ABSCESS x 2  Sedation:  2.0 mg IV Versed;  50 mcg IV Fentanyl  Total Moderate Sedation Time: 36 minutes.  Procedure:  The procedure, risks, benefits, and alternatives were explained to the patient.  Questions regarding the procedure were encouraged and answered. The patient understands and consents to the procedure.  The right lateral abdominal wall was prepped with Betadine in a sterile fashion, and a sterile drape was applied covering the operative field.  A sterile gown and sterile gloves were used for the procedure. Local anesthesia was provided with 1% Lidocaine.  CT was performed in a supine position with the right side rolled up slightly.  Under CT guidance, two separate 18 gauge trocar needles were advanced into both anterior and posterior retroperitoneal fluid collections on the right.  Fluid aspiration was performed and a fluid sample sent for culture analysis.  Guide wires were advanced into the two dominant pockets.  Tracts were dilated and 12-French percutaneous drainage catheters placed. Catheter position was confirmed by CT.  The  catheters were flushed with saline and connected to suction bulbs.  Both catheters were secured at the skin with Prolene retention sutures and adhesive Stat-Lock devices.  Complications: None  Findings: It did appear that the dominant retroperitoneal fluid collections do not appear to show good communication with two dominant pockets identified.  Aspiration at the level of the anterior dominant pocket revealed liquefied blood which was not grossly turbid.  Aspiration of the dominant posterior retroperitoneal collection in the iliac fossa yielded dark partially liquefied blood.  12-French drains were placed in both collections.  The more anterior collection shows much more significant initial return of liquefied blood.  The posterior collection is slowly draining after flushing the drainage catheter.  IMPRESSION: Both anterior and posterior dominant right retroperitoneal collections were aspirated and treated with placement  of indwelling 12-French drains.  The more anterior collection demonstrated free return of liquefied blood.  The more posterior collection yielded much darker old blood product with slow return.  Both drains were connected to suction bulbs and will be flushed every 8 hours.   Original Report Authenticated By: Irish Lack, M.D.   Ct Image Guided Fluid Drain By Catheter  08/29/2012   *RADIOLOGY REPORT*  Clinical Data: Right-sided retroperitoneal fluid collections containing air.  CT GUIDED DRAINAGE OF RETROPERITONEAL ABSCESS x 2  Sedation:  2.0 mg IV Versed;  50 mcg IV Fentanyl  Total Moderate Sedation Time: 36 minutes.  Procedure:  The procedure, risks, benefits, and alternatives were explained to the patient.  Questions regarding the procedure were encouraged and answered. The patient understands and consents to the procedure.  The right lateral abdominal wall was prepped with Betadine in a sterile fashion, and a sterile drape was applied covering the operative field.  A sterile gown and  sterile gloves were used for the procedure. Local anesthesia was provided with 1% Lidocaine.  CT was performed in a supine position with the right side rolled up slightly.  Under CT guidance, two separate 18 gauge trocar needles were advanced into both anterior and posterior retroperitoneal fluid collections on the right.  Fluid aspiration was performed and a fluid sample sent for culture analysis.  Guide wires were advanced into the two dominant pockets.  Tracts were dilated and 12-French percutaneous drainage catheters placed. Catheter position was confirmed by CT.  The catheters were flushed with saline and connected to suction bulbs.  Both catheters were secured at the skin with Prolene retention sutures and adhesive Stat-Lock devices.  Complications: None  Findings: It did appear that the dominant retroperitoneal fluid collections do not appear to show good communication with two dominant pockets identified.  Aspiration at the level of the anterior dominant pocket revealed liquefied blood which was not grossly turbid.  Aspiration of the dominant posterior retroperitoneal collection in the iliac fossa yielded dark partially liquefied blood.  12-French drains were placed in both collections.  The more anterior collection shows much more significant initial return of liquefied blood.  The posterior collection is slowly draining after flushing the drainage catheter.  IMPRESSION: Both anterior and posterior dominant right retroperitoneal collections were aspirated and treated with placement of indwelling 12-French drains.  The more anterior collection demonstrated free return of liquefied blood.  The more posterior collection yielded much darker old blood product with slow return.  Both drains were connected to suction bulbs and will be flushed every 8 hours.   Original Report Authenticated By: Irish Lack, M.D.    Microbiology: Recent Results (from the past 240 hour(s))  CULTURE, ROUTINE-ABSCESS     Status:  None   Collection Time    08/29/12  3:01 PM      Result Value Range Status   Specimen Description ABSCESS   Final   Special Requests RIGHT RETROPERITONEAL HEMATOMA   Final   Gram Stain     Final   Value: MODERATE WBC PRESENT, PREDOMINANTLY PMN     NO SQUAMOUS EPITHELIAL CELLS SEEN     MODERATE GRAM POSITIVE COCCI IN CLUSTERS     Performed at [facility]   Culture     Final   Value: ABUNDANT STAPHYLOCOCCUS AUREUS     Note: RIFAMPIN AND GENTAMICIN SHOULD NOT BE USED AS SINGLE DRUGS FOR TREATMENT OF STAPH INFECTIONS.     Performed at [facility]   Report Status  09/01/2012 FINAL   Final   Organism ID, Bacteria STAPHYLOCOCCUS AUREUS   Final  ANAEROBIC CULTURE     Status: None   Collection Time    08/29/12  3:01 PM      Result Value Range Status   Specimen Description ABSCESS   Final   Special Requests RIGHT RETROPERITONEAL HEMATOMA   Final   Gram Stain     Final   Value: MODERATE WBC PRESENT, PREDOMINANTLY PMN     NO SQUAMOUS EPITHELIAL CELLS SEEN     M0 GRAM POSITIVE COCCI     IN PAIRS INCISION     Performed at [facility]   Culture     Final   Value: NO ANAEROBES ISOLATED     Performed at [facility]   Report Status 09/03/2012 FINAL   Final  SURGICAL PCR SCREEN     Status: None   Collection Time    08/31/12  7:00 AM      Result Value Range Status   MRSA, PCR NEGATIVE  NEGATIVE Final   Staphylococcus aureus NEGATIVE  NEGATIVE Final   Comment:            The Xpert SA Assay (FDA     approved for NASAL specimens     in patients over 60 years of age),     is one component of     a comprehensive surveillance     program.  Test performance has     been validated by [employer] for patients greater     than or equal to 95 year old.     It is not intended     to diagnose infection nor to     guide or monitor treatment.  CULTURE, BLOOD (ROUTINE X 2)     Status: None   Collection Time    09/01/12  6:30 AM      Result Value Range Status    Specimen Description BLOOD RIGHT HAND   Final   Special Requests     Final   Value: BOTTLES DRAWN AEROBIC AND ANAEROBIC 10CC AEROBIC 6CC ANAEROBIC   Culture  Setup Time     Final   Value: 09/01/2012 11:05     Performed at [facility]   Culture     Final   Value: NO GROWTH 5 DAYS     Performed at [facility]   Report Status 09/07/2012 FINAL   Final  CULTURE, BLOOD (ROUTINE X 2)     Status: None   Collection Time    09/01/12  6:35 AM      Result Value Range Status   Specimen Description BLOOD LEFT HAND   Final   Special Requests BOTTLES DRAWN AEROBIC ONLY 10CC   Final   Culture  Setup Time     Final   Value: 09/01/2012 11:05     Performed at [facility]   Culture     Final   Value: NO GROWTH 5 DAYS     Performed at [facility]   Report Status 09/07/2012 FINAL   Final  ANAEROBIC CULTURE     Status: None   Collection Time    09/01/12  9:08 AM      Result Value Range Status   Specimen Description ABSCESS HIP RIGHT   Final   Special Requests RIGHT HIP MUSCULAR ABSCESS   Final   Gram Stain     Final   Value:  ABUNDANT WBC PRESENT,BOTH PMN AND MONONUCLEAR     MODERATE GRAM POSITIVE COCCI     IN PAIRS IN CLUSTERS Performed at [facility]     Performed at [facility]   Culture     Final   Value: NO ANAEROBES ISOLATED     Performed at [facility]   Report Status 09/06/2012 FINAL   Final  GRAM STAIN     Status: None   Collection Time    09/01/12  9:08 AM      Result Value Range Status   Specimen Description ABSCESS HIP RIGHT   Final   Special Requests RIGHT HIP MUSCULAR ABSCESS   Final   Gram Stain     Final   Value: ABUNDANT WBC PRESENT,BOTH PMN AND MONONUCLEAR     MODERATE GRAM POSITIVE COCCI IN PAIRS IN CLUSTERS     CALLED TO A RODRIGUEZ,RN 09/01/12 0955 BY K SCHULTZ   Report Status 09/01/2012 FINAL   Final  CULTURE, ROUTINE-ABSCESS     Status: None   Collection Time    09/01/12  9:08 AM      Result Value Range  Status   Specimen Description ABSCESS HIP RIGHT   Final   Special Requests RIGHT HIP MUSCULAR ABSCESS   Final   Gram Stain     Final   Value: ABUNDANT WBC PRESENT,BOTH PMN AND MONONUCLEAR     MODERATE GRAM POSITIVE COCCI     IN PAIRS IN CLUSTERS Performed at [facility]     Performed at [facility]   Culture     Final   Value: FEW STAPHYLOCOCCUS AUREUS     Note: RIFAMPIN AND GENTAMICIN SHOULD NOT BE USED AS SINGLE DRUGS FOR TREATMENT OF STAPH INFECTIONS.     Performed at [facility]   Report Status 09/04/2012 FINAL   Final   Organism ID, Bacteria STAPHYLOCOCCUS AUREUS   Final  TISSUE CULTURE     Status: None   Collection Time    09/02/12  2:15 PM      Result Value Range Status   Specimen Description TISSUE RIGHT LEG   Final   Special Requests POF ANCEF POSTERIOR HIP CAPSULE   Final   Gram Stain     Final   Value: NO WBC SEEN     NO ORGANISMS SEEN     Performed at [facility]   Culture     Final   Value: NO GROWTH 3 DAYS     Performed at [facility]   Report Status 09/06/2012 FINAL   Final     Labs: Basic Metabolic Panel:  Recent Labs Lab 09/03/12 0530 09/04/12 0440 09/05/12 0350 09/06/12 0558 09/07/12 0555  NA 132* 129* 133* 135 132*  K 4.6 4.0 3.7 5.1 3.4*  CL 97 92* 97 97 96  CO2 28 31 28 26 28   GLUCOSE 141* 196* 106* 104* 103*  BUN 20 16 17 21 21   CREATININE 1.41* 1.27 1.34 1.29 1.31  CALCIUM 8.5 8.5 8.6 9.2 8.8   Liver Function Tests: No results found for this basename: AST, ALT, ALKPHOS, BILITOT, PROT, ALBUMIN,  in the last 168 hours No results found for this basename: LIPASE, AMYLASE,  in the last 168 hours No results found for this basename: AMMONIA,  in the last 168 hours CBC:  Recent Labs Lab 09/02/12 0540 09/03/12 0530 09/04/12 0440 09/05/12 0350 09/06/12 0558  WBC 12.5* 15.6* 15.0* 11.3* 10.3  HGB 10.5* 10.8*  10.9* 9.9* 10.2*  HCT 32.1* 31.7* 31.5* 29.2* 29.7*  MCV 89.7 88.1 88.0 88.5 88.7  PLT  310 303 305 313 343   Cardiac Enzymes: No results found for this basename: CKTOTAL, CKMB, CKMBINDEX, TROPONINI,  in the last 168 hours BNP: BNP (last 3 results) No results found for this basename: PROBNP,  in the last 8760 hours CBG:  Recent Labs Lab 09/06/12 0728 09/06/12 1213 09/06/12 1650 09/06/12 2209 09/07/12 0730  GLUCAP 86 152* 189* 114* 108*       Signed:  WOODS, CURTIS, J  [facility] 09/07/2012, 9:37 AM

## 2012-09-08 DIAGNOSIS — N183 Chronic kidney disease, stage 3 unspecified: Secondary | ICD-10-CM

## 2012-09-08 LAB — GLUCOSE, CAPILLARY: Glucose-Capillary: 96 mg/dL (ref 70–99)

## 2012-09-08 MED ORDER — HYDROCODONE-ACETAMINOPHEN 5-500 MG PO TABS: 1.0000 | ORAL_TABLET | ORAL | Status: DC | PRN

## 2012-09-08 MED ORDER — BISACODYL 5 MG PO TBEC: 5.0000 mg | DELAYED_RELEASE_TABLET | Freq: Every day | ORAL | Status: DC | PRN

## 2012-09-08 MED ORDER — HEPARIN SOD (PORK) LOCK FLUSH 100 UNIT/ML IV SOLN
250.0000 [IU] | INTRAVENOUS | Status: AC | PRN
  Administered 2012-09-08: 250 [IU]

## 2012-09-08 MED ORDER — SODIUM CHLORIDE 0.9 % IJ SOLN
10.0000 mL | INTRAMUSCULAR | Status: DC | PRN
  Administered 2012-09-08 (×2): 10 mL

## 2012-09-08 MED ORDER — ASPIRIN 325 MG PO TABS: 325.0000 mg | ORAL_TABLET | Freq: Every day | ORAL | Status: DC

## 2012-09-08 MED ORDER — CEFAZOLIN SODIUM-DEXTROSE 2-3 GM-% IV SOLR: 2.0000 g | Freq: Three times a day (TID) | INTRAVENOUS | Status: DC

## 2012-09-08 MED ORDER — DILTIAZEM HCL ER COATED BEADS 120 MG PO CP24: 120.0000 mg | ORAL_CAPSULE | Freq: Every day | ORAL | Status: DC

## 2012-09-08 MED ORDER — RIFAMPIN 300 MG PO CAPS: 300.0000 mg | ORAL_CAPSULE | Freq: Two times a day (BID) | ORAL | Status: DC

## 2012-09-08 MED ORDER — DSS 100 MG PO CAPS: 100.0000 mg | ORAL_CAPSULE | Freq: Two times a day (BID) | ORAL | Status: DC

## 2012-09-08 NOTE — Progress Notes (Signed)
Patient for d/c today to SNF bed at   [facility]. Wife and patient agreeable to this plan- plan transfer via EMS. Reece Levy, MSW

## 2012-09-08 NOTE — Progress Notes (Signed)
Patient was discharged to [facility] by ambulance. Report was called prior to pt leaving. Patient was discharged with R arm PICC in place. Patient's right hip drsg was changed prior to discharge. Patient was stable upon discharge.

## 2012-09-08 NOTE — Progress Notes (Signed)
Subjective: 6 Days Post-Op Procedure(s) (LRB): IRRIGATION AND DEBRIDEMENT RIGHT HIP (Right) REVISION OF POLYETHYLENE LINER AND FEMORAL HEAD  (Right) Patient reports pain as 2 on 0-10 scale.    Objective: Vital signs in last 24 hours: Temp:  [98 F (36.7 C)-99.3 F (37.4 C)] 98.2 F (36.8 C) (08/14 0601) Pulse Rate:  [84-98] 98 (08/14 0601) Resp:  [20] 20 (08/14 0601) BP: (122-145)/(68-79) 135/70 mmHg (08/14 0601) SpO2:  [96 %-100 %] 97 % (08/14 0601) Weight:  [103.647 kg (228 lb 8 oz)] 103.647 kg (228 lb 8 oz) (08/13 2115)  Intake/Output from previous day: 08/13 0701 - 08/14 0700 In: -  Out: 750 [Urine:750] Intake/Output this shift:     Recent Labs  09/06/12 0558  HGB 10.2*    Recent Labs  09/06/12 0558  WBC 10.3  RBC 3.35*  HCT 29.7*  PLT 343    Recent Labs  09/06/12 0558 09/07/12 0555  NA 135 132*  K 5.1 3.4*  CL 97 96  CO2 26 28  BUN 21 21  CREATININE 1.29 1.31  GLUCOSE 104* 103*  CALCIUM 9.2 8.8   No results found for this basename: LABPT, INR,  in the last 72 hours  Neurologically intact ABD soft Neurovascular intact Sensation intact distally Intact pulses distally Dorsiflexion/Plantar flexion intact Incision: scant drainage No cellulitis present Compartment soft  Assessment/Plan: 6 Days Post-Op Procedure(s) (LRB): IRRIGATION AND DEBRIDEMENT RIGHT HIP (Right) REVISION OF POLYETHYLENE LINER AND FEMORAL HEAD  (Right) Continue IV antibiotics per infectious diseases for retroperitoneal abscess as well as right thigh abscess, and presumed infection of prosthetic hip although deep cultures of hip synovium were negative. I will need to see the patient again in approximately one week for removal of staples from the hip. He'll be given a new dressing today. He is ambulate weight bearing as tolerated with physical therapy during his rehabilitation, posterior hip precautions.  Cordon Gassett J 09/08/2012, 8:26 AM

## 2012-10-02 ENCOUNTER — Encounter (HOSPITAL_COMMUNITY): Payer: Self-pay | Admitting: Anesthesiology

## 2012-10-02 ENCOUNTER — Encounter (HOSPITAL_COMMUNITY): Payer: Self-pay | Admitting: *Deleted

## 2012-10-02 ENCOUNTER — Emergency Department (HOSPITAL_COMMUNITY): Payer: Medicare Other

## 2012-10-02 ENCOUNTER — Emergency Department (HOSPITAL_COMMUNITY): Payer: Medicare Other | Admitting: Anesthesiology

## 2012-10-02 ENCOUNTER — Inpatient Hospital Stay: Admit: 2012-10-02 | Payer: Medicare Other | Admitting: Orthopaedic Surgery

## 2012-10-02 ENCOUNTER — Observation Stay (HOSPITAL_COMMUNITY)
Admission: EM | Admit: 2012-10-02 | Discharge: 2012-10-04 | Disposition: A | Discharge status: Skilled Nursing Facility | Payer: Medicare Other | Attending: Emergency Medicine | Admitting: Emergency Medicine

## 2012-10-02 ENCOUNTER — Encounter (HOSPITAL_COMMUNITY)
Admission: EM | Disposition: A | Discharge status: Skilled Nursing Facility | Payer: Self-pay | Source: Home / Self Care | Attending: Emergency Medicine

## 2012-10-02 DIAGNOSIS — F329 Major depressive disorder, single episode, unspecified: Secondary | ICD-10-CM | POA: Insufficient documentation

## 2012-10-02 DIAGNOSIS — T84029A Dislocation of unspecified internal joint prosthesis, initial encounter: Principal | ICD-10-CM | POA: Insufficient documentation

## 2012-10-02 DIAGNOSIS — Y9210 Unspecified residential institution as the place of occurrence of the external cause: Secondary | ICD-10-CM | POA: Insufficient documentation

## 2012-10-02 DIAGNOSIS — F3289 Other specified depressive episodes: Secondary | ICD-10-CM | POA: Insufficient documentation

## 2012-10-02 DIAGNOSIS — Z794 Long term (current) use of insulin: Secondary | ICD-10-CM | POA: Insufficient documentation

## 2012-10-02 DIAGNOSIS — I10 Essential (primary) hypertension: Secondary | ICD-10-CM | POA: Insufficient documentation

## 2012-10-02 DIAGNOSIS — E119 Type 2 diabetes mellitus without complications: Secondary | ICD-10-CM | POA: Insufficient documentation

## 2012-10-02 DIAGNOSIS — Z79899 Other long term (current) drug therapy: Secondary | ICD-10-CM | POA: Insufficient documentation

## 2012-10-02 DIAGNOSIS — X500XXA Overexertion from strenuous movement or load, initial encounter: Secondary | ICD-10-CM | POA: Insufficient documentation

## 2012-10-02 DIAGNOSIS — Z96649 Presence of unspecified artificial hip joint: Secondary | ICD-10-CM | POA: Insufficient documentation

## 2012-10-02 DIAGNOSIS — I4891 Unspecified atrial fibrillation: Secondary | ICD-10-CM | POA: Insufficient documentation

## 2012-10-02 DIAGNOSIS — A4901 Methicillin susceptible Staphylococcus aureus infection, unspecified site: Secondary | ICD-10-CM | POA: Insufficient documentation

## 2012-10-02 DIAGNOSIS — R7881 Bacteremia: Secondary | ICD-10-CM

## 2012-10-02 DIAGNOSIS — S73004A Unspecified dislocation of right hip, initial encounter: Secondary | ICD-10-CM

## 2012-10-02 DIAGNOSIS — F411 Generalized anxiety disorder: Secondary | ICD-10-CM | POA: Insufficient documentation

## 2012-10-02 HISTORY — PX: HIP CLOSED REDUCTION: SHX983

## 2012-10-02 LAB — GLUCOSE, CAPILLARY: Glucose-Capillary: 106 mg/dL — ABNORMAL HIGH (ref 70–99)

## 2012-10-02 SURGERY — CLOSED REDUCTION, HIP
Anesthesia: General | Site: Hip | Laterality: Right

## 2012-10-02 MED ORDER — DILTIAZEM HCL ER COATED BEADS 120 MG PO CP24
120.0000 mg | ORAL_CAPSULE | Freq: Every day | ORAL | Status: DC
  Administered 2012-10-03 – 2012-10-04 (×2): 120 mg via ORAL
  Filled 2012-10-02 (×2): qty 1

## 2012-10-02 MED ORDER — DOCUSATE SODIUM 100 MG PO CAPS
100.0000 mg | ORAL_CAPSULE | Freq: Two times a day (BID) | ORAL | Status: DC
  Administered 2012-10-02 – 2012-10-03 (×3): 100 mg via ORAL
  Filled 2012-10-02 (×5): qty 1

## 2012-10-02 MED ORDER — ONDANSETRON HCL 4 MG PO TABS: 4.0000 mg | ORAL_TABLET | Freq: Four times a day (QID) | ORAL | Status: DC | PRN

## 2012-10-02 MED ORDER — TERAZOSIN HCL 5 MG PO CAPS
5.0000 mg | ORAL_CAPSULE | Freq: Every day | ORAL | Status: DC
  Administered 2012-10-03 – 2012-10-04 (×2): 5 mg via ORAL
  Filled 2012-10-02 (×3): qty 1

## 2012-10-02 MED ORDER — CEFAZOLIN SODIUM-DEXTROSE 2-3 GM-% IV SOLR
2.0000 g | Freq: Three times a day (TID) | INTRAVENOUS | Status: DC
  Administered 2012-10-02 – 2012-10-04 (×5): 2 g via INTRAVENOUS
  Filled 2012-10-02 (×7): qty 50

## 2012-10-02 MED ORDER — INSULIN ASPART 100 UNIT/ML ~~LOC~~ SOLN
0.0000 [IU] | Freq: Three times a day (TID) | SUBCUTANEOUS | Status: DC
  Administered 2012-10-03: 2 [IU] via SUBCUTANEOUS

## 2012-10-02 MED ORDER — BACID PO TABS
1.0000 | ORAL_TABLET | Freq: Two times a day (BID) | ORAL | Status: DC
  Administered 2012-10-02 – 2012-10-04 (×4): 1 via ORAL
  Filled 2012-10-02 (×6): qty 1

## 2012-10-02 MED ORDER — MORPHINE SULFATE 2 MG/ML IJ SOLN: 1.0000 mg | INTRAMUSCULAR | Status: DC | PRN

## 2012-10-02 MED ORDER — MORPHINE SULFATE 4 MG/ML IJ SOLN
4.0000 mg | Freq: Once | INTRAMUSCULAR | Status: DC
  Filled 2012-10-02: qty 1

## 2012-10-02 MED ORDER — PIOGLITAZONE HCL 15 MG PO TABS
15.0000 mg | ORAL_TABLET | Freq: Every day | ORAL | Status: DC
  Administered 2012-10-03 – 2012-10-04 (×2): 15 mg via ORAL
  Filled 2012-10-02 (×2): qty 1

## 2012-10-02 MED ORDER — SIMVASTATIN 10 MG PO TABS
10.0000 mg | ORAL_TABLET | Freq: Every day | ORAL | Status: DC
  Administered 2012-10-02 – 2012-10-03 (×2): 10 mg via ORAL
  Filled 2012-10-02 (×4): qty 1

## 2012-10-02 MED ORDER — METOCLOPRAMIDE HCL 5 MG/ML IJ SOLN: 5.0000 mg | Freq: Three times a day (TID) | INTRAMUSCULAR | Status: DC | PRN

## 2012-10-02 MED ORDER — INSULIN ASPART 100 UNIT/ML ~~LOC~~ SOLN: 2.0000 [IU] | Freq: Three times a day (TID) | SUBCUTANEOUS | Status: DC

## 2012-10-02 MED ORDER — VITAMIN C 500 MG PO TABS
1000.0000 mg | ORAL_TABLET | Freq: Every day | ORAL | Status: DC
  Administered 2012-10-03 – 2012-10-04 (×2): 1000 mg via ORAL
  Filled 2012-10-02 (×2): qty 2

## 2012-10-02 MED ORDER — MELATONIN 3 MG PO TABS: 3.0000 mg | ORAL_TABLET | Freq: Every day | ORAL | Status: DC

## 2012-10-02 MED ORDER — PROPOFOL 10 MG/ML IV BOLUS
2.0000 mg/kg | Freq: Once | INTRAVENOUS | Status: DC
  Filled 2012-10-02: qty 20

## 2012-10-02 MED ORDER — POTASSIUM CHLORIDE CRYS ER 20 MEQ PO TBCR
20.0000 meq | EXTENDED_RELEASE_TABLET | Freq: Every day | ORAL | Status: DC
  Administered 2012-10-03 – 2012-10-04 (×2): 20 meq via ORAL
  Filled 2012-10-02 (×2): qty 1

## 2012-10-02 MED ORDER — PROPOFOL 10 MG/ML IV BOLUS
INTRAVENOUS | Status: DC | PRN
  Administered 2012-10-02: 130 mg via INTRAVENOUS

## 2012-10-02 MED ORDER — FENTANYL CITRATE 0.05 MG/ML IJ SOLN
INTRAMUSCULAR | Status: DC | PRN
  Administered 2012-10-02: 75 ug via INTRAVENOUS

## 2012-10-02 MED ORDER — METOLAZONE 2.5 MG PO TABS
2.5000 mg | ORAL_TABLET | ORAL | Status: DC | PRN
  Filled 2012-10-02: qty 1

## 2012-10-02 MED ORDER — FINASTERIDE 5 MG PO TABS
5.0000 mg | ORAL_TABLET | Freq: Every day | ORAL | Status: DC
  Administered 2012-10-03 – 2012-10-04 (×2): 5 mg via ORAL
  Filled 2012-10-02 (×2): qty 1

## 2012-10-02 MED ORDER — FERROUS SULFATE 325 (65 FE) MG PO TABS
325.0000 mg | ORAL_TABLET | Freq: Every day | ORAL | Status: DC
  Administered 2012-10-03 – 2012-10-04 (×2): 325 mg via ORAL
  Filled 2012-10-02 (×3): qty 1

## 2012-10-02 MED ORDER — SALINE FLUSH 0.9 % IV SOLN: 10.0000 mL | Freq: Three times a day (TID) | INTRAVENOUS | Status: DC

## 2012-10-02 MED ORDER — LACTATED RINGERS IV SOLN
INTRAVENOUS | Status: DC
Start: 2012-10-02 — End: 2012-10-04

## 2012-10-02 MED ORDER — ONDANSETRON HCL 4 MG/2ML IJ SOLN
4.0000 mg | Freq: Once | INTRAMUSCULAR | Status: DC
  Filled 2012-10-02: qty 2

## 2012-10-02 MED ORDER — PRO-STAT SUGAR FREE PO LIQD
30.0000 mL | Freq: Every day | ORAL | Status: DC
  Administered 2012-10-03 – 2012-10-04 (×2): 30 mL via ORAL
  Filled 2012-10-02 (×2): qty 30

## 2012-10-02 MED ORDER — BISACODYL 5 MG PO TBEC: 5.0000 mg | DELAYED_RELEASE_TABLET | Freq: Every day | ORAL | Status: DC | PRN

## 2012-10-02 MED ORDER — LORAZEPAM 0.5 MG PO TABS: 0.5000 mg | ORAL_TABLET | Freq: Two times a day (BID) | ORAL | Status: DC | PRN

## 2012-10-02 MED ORDER — RIFAMPIN 300 MG PO CAPS
300.0000 mg | ORAL_CAPSULE | Freq: Two times a day (BID) | ORAL | Status: DC
  Administered 2012-10-02 – 2012-10-04 (×4): 300 mg via ORAL
  Filled 2012-10-02 (×6): qty 1

## 2012-10-02 MED ORDER — POLYETHYLENE GLYCOL 3350 17 G PO PACK
17.0000 g | PACK | Freq: Every day | ORAL | Status: DC
  Administered 2012-10-03: 17 g via ORAL
  Filled 2012-10-02 (×2): qty 1

## 2012-10-02 MED ORDER — LEVOTHYROXINE SODIUM 88 MCG PO TABS
88.0000 ug | ORAL_TABLET | Freq: Every day | ORAL | Status: DC
  Administered 2012-10-03 – 2012-10-04 (×2): 88 ug via ORAL
  Filled 2012-10-02 (×3): qty 1

## 2012-10-02 MED ORDER — METOCLOPRAMIDE HCL 10 MG PO TABS: 5.0000 mg | ORAL_TABLET | Freq: Three times a day (TID) | ORAL | Status: DC | PRN

## 2012-10-02 MED ORDER — LACTATED RINGERS IV SOLN
INTRAVENOUS | Status: DC | PRN
  Administered 2012-10-02: 16:00:00 via INTRAVENOUS

## 2012-10-02 MED ORDER — SODIUM CHLORIDE 0.9 % IJ SOLN
10.0000 mL | INTRAMUSCULAR | Status: DC | PRN
  Administered 2012-10-03: 20 mL
  Administered 2012-10-03: 10 mL
  Administered 2012-10-04: 20 mL

## 2012-10-02 MED ORDER — FUROSEMIDE 40 MG PO TABS
40.0000 mg | ORAL_TABLET | Freq: Every day | ORAL | Status: DC
  Administered 2012-10-03 – 2012-10-04 (×2): 40 mg via ORAL
  Filled 2012-10-02 (×2): qty 1

## 2012-10-02 MED ORDER — ONDANSETRON HCL 4 MG/2ML IJ SOLN: 4.0000 mg | Freq: Four times a day (QID) | INTRAMUSCULAR | Status: DC | PRN

## 2012-10-02 MED ORDER — METOPROLOL SUCCINATE 12.5 MG HALF TABLET
12.5000 mg | ORAL_TABLET | Freq: Every day | ORAL | Status: DC
  Administered 2012-10-03 – 2012-10-04 (×2): 12.5 mg via ORAL
  Filled 2012-10-02 (×2): qty 1

## 2012-10-02 MED ORDER — FENTANYL CITRATE 0.05 MG/ML IJ SOLN: 25.0000 ug | INTRAMUSCULAR | Status: DC | PRN

## 2012-10-02 MED ORDER — HYDROCODONE-ACETAMINOPHEN 5-325 MG PO TABS: 1.0000 | ORAL_TABLET | ORAL | Status: DC | PRN

## 2012-10-02 MED ORDER — ASPIRIN 325 MG PO TABS
325.0000 mg | ORAL_TABLET | Freq: Two times a day (BID) | ORAL | Status: DC
  Administered 2012-10-02 – 2012-10-04 (×4): 325 mg via ORAL
  Filled 2012-10-02 (×8): qty 1

## 2012-10-02 SURGICAL SUPPLY — 2 items
IMMOBILIZER KNEE 22 UNIV (SOFTGOODS) ×1 IMPLANT
KIT ROOM TURNOVER OR (KITS) ×1 IMPLANT

## 2012-10-02 NOTE — Preoperative (Signed)
Beta Blockers   Reason not to administer Beta Blockers:Pt and family state they think pt. received beta blocker in rehab facility prior to admission to [facility].  Will evaluate pt intraoperatively for need for beta blocker and treat accordingly

## 2012-10-02 NOTE — Anesthesia Postprocedure Evaluation (Signed)
  Anesthesia Post-op Note  Patient: [name]  Procedure(s) Performed: Procedure(s): CLOSED REDUCTION HIP (Right)  Patient Location: PACU  Anesthesia Type:General  Level of Consciousness: awake and sedated  Airway and Oxygen Therapy: Patient Spontanous Breathing  Post-op Pain: none  Post-op Assessment: Post-op Vital signs reviewed  Post-op Vital Signs: stable  Complications: No apparent anesthesia complications

## 2012-10-02 NOTE — ED Notes (Signed)
Patient transported to X-ray 

## 2012-10-02 NOTE — Transfer of Care (Signed)
Immediate Anesthesia Transfer of Care Note  Patient: [name]  Procedure(s) Performed: Procedure(s): CLOSED REDUCTION HIP (Right)  Patient Location: PACU  Anesthesia Type:General  Level of Consciousness: awake  Airway & Oxygen Therapy: Patient Spontanous Breathing  Post-op Assessment: Report given to PACU RN and Post -op Vital signs reviewed and stable  Post vital signs: Reviewed and stable  Complications: No apparent anesthesia complications

## 2012-10-02 NOTE — Anesthesia Procedure Notes (Signed)
Date/Time: 10/02/2012 4:22 PM Performed by: Alanda Amass A Pre-anesthesia Checklist: Patient identified, Emergency Drugs available, Suction available, Patient being monitored and Timeout performed Patient Re-evaluated:Patient Re-evaluated prior to inductionOxygen Delivery Method: Circle system utilized Preoxygenation: Pre-oxygenation with 100% oxygen Intubation Type: IV induction Ventilation: Mask ventilation without difficulty

## 2012-10-02 NOTE — ED Provider Notes (Signed)
CSN: [id number]     Arrival date & time 10/02/12  1303 History   First MD Initiated Contact with Patient 10/02/12 1324     Chief Complaint  Patient presents with  . Hip Pain   (Consider location/radiation/quality/duration/timing/severity/associated sxs/prior Treatment) HPI Comments: Patient presents today from rehab facility with a chief complaint of right hip pain.  He reports that yesterday around 2 PM he was getting up out of his wheelchair to lay into bed.  He then twisted his hip and immediately began having pain of the right hip.  Report from the Rehab Facility is that an xray was performed today and showed a hip dislocation.  He has a history of a prosthetic right hip.  Orthopedist is Dr Turner Daniels with [facility].  He recently had irrigation and debridement of the right hip done on 09/02/12.  He also has a history of Atrial Fibrillation.  He reports that he is not on anticoagulation therapy at this time.  He recently had his pacemaker removed due to infection.  He denies any numbness, tingling, chest pain, or SOB.  The history is provided by the patient.    Past Medical History  Diagnosis Date  . Atrial fibrillation   . Hypertension   . Pacemaker   . Diabetes mellitus without complication     Borderline  . Arthritis   . Anxiety   . Depression    Past Surgical History  Procedure Laterality Date  . Pacemaker insertion    . Lumbar laminectomy/decompression microdiscectomy  02/14/2012    Procedure: LUMBAR LAMINECTOMY/DECOMPRESSION MICRODISCECTOMY 1 LEVEL;  Surgeon: Tia Alert, MD;  Location: [facility] NEURO ORS;  Service: Neurosurgery;  Laterality: N/A;  Thoracic twelve - Lumbar one decompressive laminectomy.  Rhae Hammock without cardioversion N/A 08/30/2012    Procedure: TRANSESOPHAGEAL ECHOCARDIOGRAM (TEE);  Surgeon: Laurey Morale, MD;  Location: [facility] ENDOSCOPY;  Service: Cardiovascular;  Laterality: N/A;  . Icd lead removal Left 08/31/2012    Procedure: ICD LEAD REMOVAL;  Surgeon: Marinus Maw, MD;  Location: [facility] OR;  Service: Cardiovascular;  Laterality: Left;  . Incision and drainage hip Right 09/02/2012    Procedure: IRRIGATION AND DEBRIDEMENT RIGHT HIP;  Surgeon: Nestor Lewandowsky, MD;  Location: [facility] OR;  Service: Orthopedics;  Laterality: Right;  . Total hip revision Right 09/02/2012    Procedure: REVISION OF POLYETHYLENE LINER AND FEMORAL HEAD ;  Surgeon: Nestor Lewandowsky, MD;  Location: [facility] OR;  Service: Orthopedics;  Laterality: Right;   History reviewed. No pertinent family history. History  Substance Use Topics  . Smoking status: Never Smoker   . Smokeless tobacco: Not on file  . Alcohol Use: No    Review of Systems  All other systems reviewed and are negative.    Allergies  Review of patient's allergies indicates no known allergies.  Home Medications   Current Outpatient Rx  Name  Route  Sig  Dispense  Refill  . aspirin 325 MG tablet   Oral   Take 1 tablet (325 mg total) by mouth daily.   90 tablet   0   . bisacodyl (DULCOLAX) 5 MG EC tablet   Oral   Take 1 tablet (5 mg total) by mouth daily as needed.   30 tablet   0   . ceFAZolin (ANCEF) 2-3 GM-% SOLR   Intravenous   Inject 50 mL (2 g total) into the vein every 8 (eight) hours.   136 each   0   . diltiazem (CARDIZEM CD)  120 MG 24 hr capsule   Oral   Take 1 capsule (120 mg total) by mouth daily.   90 capsule   0   . diphenhydramine-acetaminophen (TYLENOL PM) 25-500 MG TABS   Oral   Take 1 tablet by mouth at bedtime as needed.         . docusate sodium 100 MG CAPS   Oral   Take 100 mg by mouth 2 (two) times daily.   10 capsule   0   . finasteride (PROSCAR) 5 MG tablet   Oral   Take 5 mg by mouth daily.           . furosemide (LASIX) 40 MG tablet   Oral   Take 40 mg by mouth daily.           Marland Kitchen HYDROcodone-acetaminophen (VICODIN) 5-500 MG per tablet   Oral   Take 1 tablet by mouth every 4 (four) hours as needed for pain.   60 tablet   0   . levothyroxine (SYNTHROID,  LEVOTHROID) 75 MCG tablet   Oral   Take 75 mcg by mouth daily.           Marland Kitchen lovastatin (MEVACOR) 20 MG tablet   Oral   Take 40 mg by mouth at bedtime.           . metolazone (ZAROXOLYN) 2.5 MG tablet   Oral   Take 2.5 mg by mouth every Monday, Wednesday, and Friday. As needed for fluid retention.         . metoprolol succinate (TOPROL-XL) 25 MG 24 hr tablet   Oral   Take 12.5 mg by mouth daily.         . pioglitazone (ACTOS) 15 MG tablet   Oral   Take 1 tablet by mouth daily.         . rifampin (RIFADIN) 300 MG capsule   Oral   Take 1 capsule (300 mg total) by mouth every 12 (twelve) hours.   90 capsule   0   . terazosin (HYTRIN) 5 MG capsule   Oral   Take 5 mg by mouth daily.            BP 122/69  Temp(Src) 99 F (37.2 C) (Oral)  Resp 24  Ht 5\' 11"  (1.803 m)  Wt 220 lb (99.791 kg)  BMI 30.7 kg/m2  SpO2 95% Physical Exam  Nursing note and vitals reviewed. Constitutional: He appears well-developed and well-nourished.  HENT:  Head: Normocephalic and atraumatic.  Neck: Normal range of motion. Neck supple.  Cardiovascular: Normal rate and normal heart sounds.  An irregularly irregular rhythm present.  Pulses:      Dorsalis pedis pulses are 2+ on the right side, and 2+ on the left side.  Pulmonary/Chest: Effort normal and breath sounds normal.  Musculoskeletal:       Right hip: He exhibits decreased range of motion and tenderness. He exhibits no swelling.  Pain of the right hip with minimal internal and external rotation.    Neurological: He is alert.  Distal sensation of the right foot is intact  Skin: Skin is warm and dry.  Psychiatric: He has a normal mood and affect.    ED Course  Procedures (including critical care time) Labs Review Labs Reviewed - No data to display Imaging Review Dg Hip Complete Right  10/02/2012   *RADIOLOGY REPORT*  Clinical Data: Right-sided hip pain  RIGHT HIP - COMPLETE 2+ VIEW  Comparison: 08/27/2012; 11/26/2010   Findings:  There has been interval recurrent posterior superior dislocation of the right total hip femoral prosthesis.  No definitive associated fracture.  Limited visualization of the adjacent pelvis and contralateral left hip is normal.  Degenerative change within the imaged lower lumbar spine.  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   Original Report Authenticated By: Tacey Ruiz, MD     Date: 10/02/2012  Rate: 100  Rhythm: atrial fibrillation  QRS Axis: left  Intervals: a fib  ST/T Wave abnormalities: normal  Conduction Disutrbances:none  Narrative Interpretation:   Old EKG Reviewed: unchanged  3:02 PM Discussed with Dr. Jerl Santos. He reports that he will come see the patient in the ED and assist with hip reduction.  MDM  No diagnosis found. Patient with a history of right hip replacement presents today with a right hip dislocation.  Xray confirming dislocation, but not showing any evidence of fracture.  Patient is neurovascularly intact.  Patient seen by Orthopedics in the ED and admitted.  Plan is for Orthopedics to take the patient to the OR for hip reduction.   Pascal Lux [place], PA-C 10/02/12 [phone]

## 2012-10-02 NOTE — Brief Op Note (Signed)
[name] [id number] 10/02/2012   PRE-OP DIAGNOSIS: dislocated right THR  POST-OP DIAGNOSIS: same  PROCEDURE: closed reduction  ANESTHESIA: general  [name] G   Dictation #:  [id number]

## 2012-10-02 NOTE — Interval H&P Note (Signed)
History and Physical Interval Note:  10/02/2012 3:51 PM  [name]  has presented today for surgery, with the diagnosis of Right hip replacement dislocation  The various methods of treatment have been discussed with the patient and family. After consideration of risks, benefits and other options for treatment, the patient has consented to  Procedure(s): CLOSED REDUCTION HIP (Right) as a surgical intervention .  The patient's history has been reviewed, patient examined, no change in status, stable for surgery.  I have reviewed the patient's chart and labs.  Questions were answered to the patient's satisfaction.     Stoy Fenn G

## 2012-10-02 NOTE — ED Provider Notes (Signed)
Medical screening examination/treatment/procedure(s) were conducted as a shared visit with non-physician practitioner(s) and myself.  I personally evaluated the patient during the encounter.  Chronic A. Fib, pacemaker removed due to infection, clinically suspect dislocated right hip prosthesis upon arrival, distal dorsalis pedis pulse intact with capillary refill less than 2 seconds normal light touch to the right foot, lungs clear, cardiac irregularly irregular without audible murmur, airway patent and maintained upon arrival  Ortho will take Pt to OR.  Hurman Horn, MD 10/03/12 [phone]

## 2012-10-02 NOTE — ED Notes (Signed)
Pt arrived by [facility] from rehab facility, hx of right hip surgery one month ago. Had onset of severe pain to hip again yesterday, no new trauma or fall, had xray done last night and it showed dislocation of hip. Given vicodin at facility 1214 and fentanyl ivp by ems @ 1250.

## 2012-10-02 NOTE — Progress Notes (Signed)

## 2012-10-02 NOTE — Anesthesia Preprocedure Evaluation (Addendum)
Anesthesia Evaluation  Patient identified by MRN, date of birth, ID band Patient awake    Reviewed: Allergy & Precautions, H&P , NPO status , Patient's Chart, lab work & pertinent test results  History of Anesthesia Complications Negative for: history of anesthetic complications  Airway Mallampati: III TM Distance: >3 FB Neck ROM: Limited    Dental  (+) Edentulous Upper and Dental Advisory Given   Pulmonary neg pulmonary ROS,  breath sounds clear to auscultation  Pulmonary exam normal - decreased breath sounds      Cardiovascular hypertension, Pt. on medications and Pt. on home beta blockers + dysrhythmias Atrial Fibrillation + pacemaker (previous pacemaker, removed 08/31/12) Rhythm:Irregular Rate:Normal  8/14 TEE: EF 55-60%, valves Opacemaker removed for sepsis   Neuro/Psych negative neurological ROS     GI/Hepatic negative GI ROS, Neg liver ROS,   Endo/Other  diabetes, Well Controlled, Type 2, Insulin DependentHypothyroidism Morbid obesity  Renal/GU      Musculoskeletal  (+) Arthritis -,   Abdominal (+) + obese,   Peds  Hematology negative hematology ROS (+) Blood dyscrasia (Hb 10.3), anemia ,   Anesthesia Other Findings   Reproductive/Obstetrics                           Anesthesia Physical Anesthesia Plan  ASA: III and emergent  Anesthesia Plan: General   Post-op Pain Management:    Induction: Intravenous  Airway Management Planned: Mask  Additional Equipment:   Intra-op Plan:   Post-operative Plan:   Informed Consent:   Plan Discussed with: CRNA and Surgeon  Anesthesia Plan Comments:         Anesthesia Quick Evaluation

## 2012-10-02 NOTE — H&P (Signed)
Reason for Consult:   Dislocated THR Referring Physician:    [name] is an 79 y.o. male  Who is only 5 weeks from I&D and poly swap for dislocated infected hip replacement. Currently staying in a rehab center and was doing well until today when he sat on his bed and experienced a terrible pain.  Taken to ED via ambulance and xrays show dislocation.  ORS consulted.  Denies other pains.  Has been through a lot and wants as little pain as possible putting this back in place.  No fevers recently and otherwise feels fine.  Still on IV antibiotics.  Just saw Dr Turner Daniels a few days ago.    Past Medical History  Diagnosis Date  . Atrial fibrillation   . Hypertension   . Pacemaker   . Diabetes mellitus without complication     Borderline  . Arthritis   . Anxiety   . Depression     Past Surgical History  Procedure Laterality Date  . Pacemaker insertion    . Lumbar laminectomy/decompression microdiscectomy  02/14/2012    Procedure: LUMBAR LAMINECTOMY/DECOMPRESSION MICRODISCECTOMY 1 LEVEL;  Surgeon: Tia Alert, MD;  Location: [facility] NEURO ORS;  Service: Neurosurgery;  Laterality: N/A;  Thoracic twelve - Lumbar one decompressive laminectomy.  Rhae Hammock without cardioversion N/A 08/30/2012    Procedure: TRANSESOPHAGEAL ECHOCARDIOGRAM (TEE);  Surgeon: Laurey Morale, MD;  Location: [facility] ENDOSCOPY;  Service: Cardiovascular;  Laterality: N/A;  . Icd lead removal Left 08/31/2012    Procedure: ICD LEAD REMOVAL;  Surgeon: Marinus Maw, MD;  Location: [facility] OR;  Service: Cardiovascular;  Laterality: Left;  . Incision and drainage hip Right 09/02/2012    Procedure: IRRIGATION AND DEBRIDEMENT RIGHT HIP;  Surgeon: Nestor Lewandowsky, MD;  Location: [facility] OR;  Service: Orthopedics;  Laterality: Right;  . Total hip revision Right 09/02/2012    Procedure: REVISION OF POLYETHYLENE LINER AND FEMORAL HEAD ;  Surgeon: Nestor Lewandowsky, MD;  Location: [facility] OR;  Service: Orthopedics;  Laterality: Right;    History reviewed. No  pertinent family history.  Social History:  reports that he has never smoked. He does not have any smokeless tobacco history on file. He reports that he does not drink alcohol or use illicit drugs.  Allergies: No Known Allergies  Medications: I have reviewed the patient's current medications.  No results found for this or any previous visit (from the past 48 hour(s)).  Dg Hip Complete Right  10/02/2012   *RADIOLOGY REPORT*  Clinical Data: Right-sided hip pain  RIGHT HIP - COMPLETE 2+ VIEW  Comparison: 08/27/2012; 11/26/2010  Findings:  There has been interval recurrent posterior superior dislocation of the right total hip femoral prosthesis.  No definitive associated fracture.  Limited visualization of the adjacent pelvis and contralateral left hip is normal.  Degenerative change within the imaged lower lumbar spine.  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   Original Report Authenticated By: Tacey Ruiz, MD    @ROS @ Blood pressure 122/69, temperature 99 F (37.2 C), temperature source Oral, resp. rate 24, height 5\' 11"  (1.803 m), weight 99.791 kg (220 lb), SpO2 95.00%.  PHYSICAL EXAM:   ABD soft Neurovascular intact Sensation intact distally Intact pulses distally Incision: no drainage No cellulitis present  Right leg is SER and painful Other 3 extremities move fully  ASSESSMENT:   Dislocated right THR  PLAN:   Need CR ASAP.  Wants to go to  OR and that is fine with me.  Will admit overnight so Dr Turner Daniels can see in AM and also to go over disloc precautions once again and maybe a PT visit.  Discussed with his family members present.   [name] 10/02/2012, 3:35 PM

## 2012-10-03 ENCOUNTER — Inpatient Hospital Stay: Payer: Medicare Other | Admitting: Infectious Disease

## 2012-10-03 ENCOUNTER — Encounter (HOSPITAL_COMMUNITY): Payer: Self-pay | Admitting: Orthopaedic Surgery

## 2012-10-03 DIAGNOSIS — R7881 Bacteremia: Secondary | ICD-10-CM

## 2012-10-03 DIAGNOSIS — K6819 Other retroperitoneal abscess: Secondary | ICD-10-CM

## 2012-10-03 DIAGNOSIS — A4901 Methicillin susceptible Staphylococcus aureus infection, unspecified site: Secondary | ICD-10-CM

## 2012-10-03 LAB — CBC WITH DIFFERENTIAL/PLATELET
Basophils Absolute: 0 10*3/uL (ref 0.0–0.1)
Basophils Relative: 0 % (ref 0–1)
Eosinophils Relative: 1 % (ref 0–5)
HCT: 28.7 % — ABNORMAL LOW (ref 39.0–52.0)
Hemoglobin: 9.4 g/dL — ABNORMAL LOW (ref 13.0–17.0)
MCHC: 32.8 g/dL (ref 30.0–36.0)
MCV: 93.8 fL (ref 78.0–100.0)
Monocytes Absolute: 0.9 10*3/uL (ref 0.1–1.0)
Monocytes Relative: 8 % (ref 3–12)
Neutro Abs: 9.1 10*3/uL — ABNORMAL HIGH (ref 1.7–7.7)
RDW: 16.9 % — ABNORMAL HIGH (ref 11.5–15.5)

## 2012-10-03 LAB — GLUCOSE, CAPILLARY: Glucose-Capillary: 118 mg/dL — ABNORMAL HIGH (ref 70–99)

## 2012-10-03 NOTE — Progress Notes (Addendum)
INFECTIOUS DISEASE PROGRESS NOTE  ID: [name] is a 79 y.o. male with  Principal Problem:   Closed dislocation of right hip  Subjective: 79 yo M with hx of pacemaker, R THR, and laminectomy 01-2012. He returned to the hospital 08-25-12 with MSSA bacteremia (TEE-) and retroperitoneal abscess adjacent to THR. His pacer was removed on 8-6 and he underwent I &D and replacement of parts of his THR on 09-02-12. He was d/c home with ancef and rifampin on 09-07-12 (planned for 6-8 weeks).   He returns tot he hospital on 10-02-12 with dislocation of his THR. He underwent closed reduction of his THR 10-02-12.   ROS- no f/c, normal apetite, normal BM, normal urination, no CP, no palpitations. See HPI.   Abtx:  Anti-infectives   Start     Dose/Rate Route Frequency Ordered Stop   10/02/12 2200  ceFAZolin (ANCEF) IVPB 2 g/50 mL premix     2 g 100 mL/hr over 30 Minutes Intravenous 3 times per day 10/02/12 1751     10/02/12 2200  rifampin (RIFADIN) capsule 300 mg     300 mg Oral Every 12 hours 10/02/12 1751        Medications:  Scheduled: . aspirin  325 mg Oral BID  . ceFAZolin  2 g Intravenous Q8H  . diltiazem  120 mg Oral Daily  . docusate sodium  100 mg Oral BID  . feeding supplement  30 mL Oral Daily  . ferrous sulfate  325 mg Oral Q breakfast  . finasteride  5 mg Oral Daily  . furosemide  40 mg Oral Daily  . insulin aspart  0-15 Units Subcutaneous TID WC  . lactobacillus acidophilus  1 tablet Oral BID  . levothyroxine  88 mcg Oral QAC breakfast  . metoprolol succinate  12.5 mg Oral Daily  . pioglitazone  15 mg Oral Daily  . polyethylene glycol  17 g Oral Daily  . potassium chloride SA  20 mEq Oral Daily  . rifampin  300 mg Oral Q12H  . simvastatin  10 mg Oral q1800  . terazosin  5 mg Oral Daily  . vitamin C  1,000 mg Oral Daily    Objective: Vital signs in last 24 hours: Temp:  [98.6 F (37 C)-99.5 F (37.5 C)] 99 F (37.2 C) (09/08 1444) Pulse Rate:  [83-107] 89 (09/08  1444) Resp:  [18-22] 20 (09/08 1444) BP: (116-152)/(67-88) 116/77 mmHg (09/08 1444) SpO2:  [96 %-99 %] 99 % (09/08 1444) Weight:  [99.791 kg (220 lb)] 99.791 kg (220 lb) (09/08 0944)   General appearance: alert, cooperative and no distress Eyes: negative findings: conjunctivae and sclerae normal and pupils equal, round, reactive to light and accomodation Throat: normal findings: oropharynx pink & moist without lesions or evidence of thrush Neck: no adenopathy and supple, symmetrical, trachea midline Resp: clear to auscultation bilaterally Chest wall: no tenderness, pacer wound is dressed, clean.  Cardio: irregularly irregular rhythm GI: normal findings: bowel sounds normal and soft, non-tender Extremities: edema trace and RUE PIC is clean, no erythema, no d/c, non-tender.   Lab Results  Recent Labs  10/03/12 0849  WBC 11.1*  HGB 9.4*  HCT 28.7*   Liver Panel No results found for this basename: PROT, ALBUMIN, AST, ALT, ALKPHOS, BILITOT, BILIDIR, IBILI,  in the last 72 hours Sedimentation Rate  Recent Labs  10/03/12 1315  ESRSEDRATE 135*   C-Reactive Protein  Recent Labs  10/03/12 0849  CRP 15.1*    Microbiology: No results found  for this or any previous visit (from the past 240 hour(s)).  Studies/Results: Dg Hip Complete Right  10/02/2012   *RADIOLOGY REPORT*  Clinical Data: Right-sided hip pain  RIGHT HIP - COMPLETE 2+ VIEW  Comparison: 08/27/2012; 11/26/2010  Findings:  There has been interval recurrent posterior superior dislocation of the right total hip femoral prosthesis.  No definitive associated fracture.  Limited visualization of the adjacent pelvis and contralateral left hip is normal.  Degenerative change within the imaged lower lumbar spine.  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   Original Report Authenticated By: Tacey Ruiz, MD     Assessment/Plan: MSSA  bacteremia, retroperitoneal abscess Total days of antibiotics: 36 (ancef/rifampin)  Would Continue his current anbx til he has f/u in ID clinic.  Consider repeat BCx at end of therapy when he is off anbx My only concern would be if he had dislocation related to ongoing infection. His ESR and CRP are both very elevated, I am not sure that this is indicative of infection or his current situation.  Available as needed.          [name] Infectious Diseases (pager) [phone] [email or web address] 10/03/2012, 4:55 PM  LOS: 1 day

## 2012-10-03 NOTE — Evaluation (Signed)
Physical Therapy Evaluation Patient Details Name: [name] MRN: [id number] DOB: [birth year] Today's Date: 10/03/2012 Time: 1330-1410 PT Time Calculation (min): 40 min  PT Assessment / Plan / Recommendation History of Present Illness  [name] is a 79 y.o. male with  Pacemaker, Right prosthetic hip, Laminectomy in January of 2014 admitted with right leg pain weakness fevers. He has been found to have MSSA bacteremia and a large retroperitoneal abscess that is adjacent to his prosthetic hip.   Pt with s/p I & D right hip with R THA revision on 09/02/12 Admitted after hip dislocation, s.p closed reduction; now with hip abd brace  Clinical Impression  Patient is s/p above closed reduction og R hip dislocation surgery resulting in functional limitations due to the deficits listed below (see PT Problem List).  Patient will benefit from skilled PT to increase their independence and safety with mobility to allow discharge to the venue listed below.       PT Assessment  Patient needs continued PT services    Follow Up Recommendations  SNF    Does the patient have the potential to tolerate intense rehabilitation      Barriers to Discharge        Equipment Recommendations  Rolling walker with 5" wheels;3in1 (PT)    Recommendations for Other Services     Frequency Min 4X/week    Precautions / Restrictions Precautions Precautions: Posterior Hip;Fall Precaution Comments: able to recite posterior hip prec Required Braces or Orthoses: Other Brace/Splint Other Brace/Splint: R hip Abduction brace Restrictions Weight Bearing Restrictions: Yes RLE Weight Bearing: Weight bearing as tolerated   Pertinent Vitals/Pain Reports no pain when asked      Mobility  Bed Mobility Bed Mobility: Supine to Sit;Sitting - Scoot to Edge of Bed;Sit to Supine Supine to Sit: 1: +2 Total assist Supine to Sit: Patient Percentage: 60% Sitting - Scoot to Edge of Bed: 3: Mod assist Sit to Supine: 1: +2  Total assist Sit to Supine: Patient Percentage: 40% Details for Bed Mobility Assistance: Max cues and close guard for post hip prec; requiring assist to elevate trunk from bed and phusical assist to help LEs back to bed Transfers Transfers: Stand to Sit;Sit to Stand Sit to Stand: 1: +2 Total assist;With upper extremity assist;From bed Sit to Stand: Patient Percentage: 50% Stand to Sit: 1: +2 Total assist;To bed Stand to Sit: Patient Percentage: 50% Details for Transfer Assistance: Ceus for technique and positioning of RLE for post hip prec; Heavily dependent on momentum for successful sit to stand Ambulation/Gait Ambulation/Gait Assistance: 1: +2 Total assist Ambulation/Gait: Patient Percentage: 60% Ambulation Distance (Feet): 2 Feet (sidesteps to [facility]) Assistive device: Rolling walker Ambulation/Gait Assistance Details: Cues for gait sequence, technique, posture    Exercises     PT Diagnosis: Difficulty walking;Generalized weakness  PT Problem List: Decreased strength;Decreased range of motion;Decreased activity tolerance;Decreased mobility;Decreased coordination;Decreased knowledge of use of DME;Decreased knowledge of precautions PT Treatment Interventions: DME instruction;Gait training;Functional mobility training;Therapeutic activities;Therapeutic exercise;Balance training;Patient/family education     PT Goals(Current goals can be found in the care plan section) Acute Rehab PT Goals Patient Stated Goal: get the hip better PT Goal Formulation: With patient Time For Goal Achievement: 10/10/12 Potential to Achieve Goals: Good  Visit Information  Last PT Received On: 10/03/12 Assistance Needed: +2 History of Present Illness: [name] is a 79 y.o. male with  Pacemaker, Right prosthetic hip, Laminectomy in January of 2014 admitted with right leg pain weakness fevers. He has been found to  have MSSA bacteremia and a large retroperitoneal abscess that is adjacent to his prosthetic  hip.   Pt with s/p I & D right hip with R THA revision on 09/02/12 Admitted after hip dislocation, s.p closed reduction; now with hip abd brace       Prior Functioning  Home Living Family/patient expects to be discharged to:: Skilled nursing facility Prior Function Level of Independence: Needs assistance Gait / Transfers Assistance Needed: Occasional use of RW vs cane Comments: Pt reports progressive decline since 01/19/12 Communication Communication: HOH Dominant Hand: Right    Cognition  Cognition Arousal/Alertness: Awake/alert Behavior During Therapy: WFL for tasks assessed/performed Overall Cognitive Status: Within Functional Limits for tasks assessed    Extremity/Trunk Assessment Upper Extremity Assessment Upper Extremity Assessment: Overall WFL for tasks assessed Lower Extremity Assessment Lower Extremity Assessment: RLE deficits/detail RLE Deficits / Details: RLE immobilized in hip abd brace   Balance    End of Session PT - End of Session Equipment Utilized During Treatment: Other (comment) (R hip abd brace) Activity Tolerance: Patient tolerated treatment well;Patient limited by fatigue Patient left: in bed;with call bell/phone within reach Nurse Communication: Mobility status  GP Functional Assessment Tool Used: Clinical Judgement Functional Limitation: Mobility: Walking and moving around Mobility: Walking and Moving Around Current Status (Y4034): At least 60 percent but less than 80 percent impaired, limited or restricted Mobility: Walking and Moving Around Goal Status [phone]): At least 1 percent but less than 20 percent impaired, limited or restricted   [name] [name], [place] [phone]  10/03/2012, 4:04 PM

## 2012-10-03 NOTE — Discharge Summary (Signed)
Patient ID: [name] MRN: [id number] DOB/AGE: [birth year] 79 y.o.  Admit date: 10/02/2012 Discharge date: 10/03/2012  Admission Diagnoses:  Principal Problem:   Closed dislocation of right hip   Discharge Diagnoses:  Same  Past Medical History  Diagnosis Date  . Atrial fibrillation   . Hypertension   . Pacemaker   . Diabetes mellitus without complication     Borderline  . Arthritis   . Anxiety   . Depression     Surgeries: Procedure(s): CLOSED REDUCTION HIP on 10/02/2012   Consultants:    Discharged Condition: Improved  Hospital Course: Everard Interrante is an 79 y.o. male who was admitted 10/02/2012 for operative treatment ofClosed dislocation of right hip. Patient has severe unremitting pain that affects sleep, daily activities, and work/hobbies. After pre-op clearance the patient was taken to the operating room on 10/02/2012 and underwent  Procedure(s): CLOSED REDUCTION HIP.    Patient was given perioperative antibiotics: Anti-infectives   Start     Dose/Rate Route Frequency Ordered Stop   10/02/12 2200  ceFAZolin (ANCEF) IVPB 2 g/50 mL premix     2 g 100 mL/hr over 30 Minutes Intravenous 3 times per day 10/02/12 1751     10/02/12 2200  rifampin (RIFADIN) capsule 300 mg     300 mg Oral Every 12 hours 10/02/12 1751         Patient was given sequential compression devices, early ambulation, and chemoprophylaxis to prevent DVT.  Patient benefited maximally from hospital stay and there were no complications.    Recent vital signs: Patient Vitals for the past 24 hrs:  BP Temp Temp src Pulse Resp SpO2 Height Weight  10/03/12 0643 140/75 mmHg 98.8 F (37.1 C) Oral 95 18 99 % - -  10/02/12 2334 141/73 mmHg 99 F (37.2 C) Axillary 101 20 98 % - -  10/02/12 1749 147/67 mmHg 99.5 F (37.5 C) Oral 107 20 99 % - -  10/02/12 1730 - - - 103 21 98 % - -  10/02/12 1715 137/88 mmHg 98.6 F (37 C) - 98 19 97 % - -  10/02/12 1700 152/84 mmHg - - 106 22 96 % - -  10/02/12 1645  129/86 mmHg - - 102 23 97 % - -  10/02/12 1633 136/86 mmHg 99 F (37.2 C) - 110 17 96 % - -  10/02/12 1326 122/69 mmHg 99 F (37.2 C) Oral - 24 95 % 5\' 11"  (1.803 m) 99.791 kg (220 lb)  10/02/12 1303 - - - - - 97 % - -     Recent laboratory studies: No results found for this basename: WBC, HGB, HCT, PLT, NA, K, CL, CO2, BUN, CREATININE, GLUCOSE, PT, INR, CALCIUM, 2,  in the last 72 hours   Discharge Medications:     Medication List         aspirin 325 MG tablet  Take 1 tablet (325 mg total) by mouth daily.     bisacodyl 5 MG EC tablet  Commonly known as:  DULCOLAX  Take 1 tablet (5 mg total) by mouth daily as needed.     bisacodyl 5 MG EC tablet  Commonly known as:  DULCOLAX  Take 5 mg by mouth daily as needed for constipation.     ceFAZolin 2-3 GM-% Solr  Commonly known as:  ANCEF  Inject 50 mL (2 g total) into the vein every 8 (eight) hours.     Coenzyme Q10 200 MG capsule  Take 200 mg by mouth daily.  diltiazem 120 MG 24 hr capsule  Commonly known as:  CARDIZEM CD  Take 1 capsule (120 mg total) by mouth daily.     DSS 100 MG Caps  Take 100 mg by mouth 2 (two) times daily.     feeding supplement Liqd  Take 30 mLs by mouth daily.     ferrous sulfate 325 (65 FE) MG tablet  Take 325 mg by mouth daily with breakfast.     finasteride 5 MG tablet  Commonly known as:  PROSCAR  Take 5 mg by mouth daily.     furosemide 40 MG tablet  Commonly known as:  LASIX  Take 40 mg by mouth daily.     HYDROcodone-acetaminophen 5-500 MG per tablet  Commonly known as:  VICODIN  Take 1 tablet by mouth every 4 (four) hours as needed for pain.     insulin aspart 100 UNIT/ML injection  Commonly known as:  novoLOG  - Inject 2-12 Units into the skin 3 (three) times daily with meals. Sliding Scale Insulin  - 151-200=2 units; 201-250=4 units; 251-300=6 units; 301-350=8 units; 351-400=10 units; >400=12 units and call doctor     lactobacillus acidophilus Tabs tablet  Take 1  tablet by mouth 2 (two) times daily.     levothyroxine 88 MCG tablet  Commonly known as:  SYNTHROID, LEVOTHROID  Take 88 mcg by mouth daily before breakfast.     LORazepam 0.5 MG tablet  Commonly known as:  ATIVAN  Take 0.5 mg by mouth every 12 (twelve) hours as needed for anxiety.     lovastatin 20 MG tablet  Commonly known as:  MEVACOR  Take 40 mg by mouth at bedtime.     Melatonin 3 MG Tabs  Take 3 mg by mouth at bedtime.     metolazone 2.5 MG tablet  Commonly known as:  ZAROXOLYN  Take 2.5 mg by mouth See admin instructions. Every Monday, Wednesday, Friday as needed for continued fluid retention     metoprolol succinate 25 MG 24 hr tablet  Commonly known as:  TOPROL-XL  Take 12.5 mg by mouth daily.     pioglitazone 15 MG tablet  Commonly known as:  ACTOS  Take 1 tablet by mouth daily.     polyethylene glycol packet  Commonly known as:  MIRALAX / GLYCOLAX  Take 17 g by mouth daily.     potassium chloride SA 20 MEQ tablet  Commonly known as:  K-DUR,KLOR-CON  Take 20 mEq by mouth daily.     rifampin 300 MG capsule  Commonly known as:  RIFADIN  Take 1 capsule (300 mg total) by mouth every 12 (twelve) hours.     Saline Flush 0.9 % Soln  Inject 10 mLs into the vein every 8 (eight) hours.     terazosin 5 MG capsule  Commonly known as:  HYTRIN  Take 5 mg by mouth daily.     vitamin C 500 MG tablet  Commonly known as:  ASCORBIC ACID  Take 1,000 mg by mouth daily.        Diagnostic Studies: Dg Hip Complete Right  10/02/2012   *RADIOLOGY REPORT*  Clinical Data: Right-sided hip pain  RIGHT HIP - COMPLETE 2+ VIEW  Comparison: 08/27/2012; 11/26/2010  Findings:  There has been interval recurrent posterior superior dislocation of the right total hip femoral prosthesis.  No definitive associated fracture.  Limited visualization of the adjacent pelvis and contralateral left hip is normal.  Degenerative change within the imaged lower lumbar spine.  IMPRESSION: Recurrent  posterior, superior dislocation of the right total hip femoral prosthesis without evidence of fracture.   Original Report Authenticated By: Tacey Ruiz, MD   Ir Fluoro Guide Cv Line Right  09/07/2012   *RADIOLOGY REPORT*  PICC PLACEMENT WITH ULTRASOUND AND FLUOROSCOPIC  GUIDANCE  Clinical History: 79 year old male with infected right retroperitoneal hematoma.  Durable central venous access is requested for outpatient IV antibiotic therapy.  Fluoroscopy Time: 12 seconds  Procedure:  The right arm was prepped with chlorhexidine, draped in the usual sterile fashion using maximum barrier technique (cap and mask, sterile gown, sterile gloves, large sterile sheet, hand hygiene and cutaneous antiseptic).  Local anesthesia was attained by infiltration with 1% lidocaine.  Ultrasound demonstrated patency of the right basilic vein, and this was documented with an image.  Under real-time ultrasound guidance, this vein was accessed with a 21 gauge micropuncture needle and image documentation was performed.  The needle was exchanged over a guidewire for a peel-away sheath through which a 40 cm 5 [place] dual lumen power injectable PICC was advanced, and positioned with its tip at the lower SVC/right atrial junction.  Fluoroscopy during the procedure and fluoro spot radiograph confirms appropriate catheter position.  The catheter was flushed, secured to the skin with Prolene sutures, and covered with a sterile dressing.  Complications:  None.  The patient tolerated the procedure well.  IMPRESSION:  Successful placement of a right basilic vein approach dual lumen power PICC with sonographic and fluoroscopic guidance.  The catheter is ready for use.  Signed,  Sterling Big, MD Vascular & Interventional Radiologist [facility] Radiology   Original Report Authenticated By: Malachy Moan, M.D.   Ir US Guide Vasc Access Right  09/07/2012   *RADIOLOGY REPORT*  PICC PLACEMENT WITH ULTRASOUND AND FLUOROSCOPIC  GUIDANCE   Clinical History: 79 year old male with infected right retroperitoneal hematoma.  Durable central venous access is requested for outpatient IV antibiotic therapy.  Fluoroscopy Time: 12 seconds  Procedure:  The right arm was prepped with chlorhexidine, draped in the usual sterile fashion using maximum barrier technique (cap and mask, sterile gown, sterile gloves, large sterile sheet, hand hygiene and cutaneous antiseptic).  Local anesthesia was attained by infiltration with 1% lidocaine.  Ultrasound demonstrated patency of the right basilic vein, and this was documented with an image.  Under real-time ultrasound guidance, this vein was accessed with a 21 gauge micropuncture needle and image documentation was performed.  The needle was exchanged over a guidewire for a peel-away sheath through which a 40 cm 5 [place] dual lumen power injectable PICC was advanced, and positioned with its tip at the lower SVC/right atrial junction.  Fluoroscopy during the procedure and fluoro spot radiograph confirms appropriate catheter position.  The catheter was flushed, secured to the skin with Prolene sutures, and covered with a sterile dressing.  Complications:  None.  The patient tolerated the procedure well.  IMPRESSION:  Successful placement of a right basilic vein approach dual lumen power PICC with sonographic and fluoroscopic guidance.  The catheter is ready for use.  Signed,  Sterling Big, MD Vascular & Interventional Radiologist [facility] Radiology   Original Report Authenticated By: Malachy Moan, M.D.    Disposition: 03-Skilled Nursing Facility      Discharge Orders   Future Appointments Provider Department Dept Phone   10/03/2012 2:00 PM Randall Hiss, MD [facility] [phone]   Future Orders Complete By Expires   Call MD / Call 911  As directed    Comments:     If you experience chest pain or shortness of breath, CALL 911 and be transported to the  hospital emergency room.  If you develope a fever above 101 F, pus (white drainage) or increased drainage or redness at the wound, or calf pain, call your surgeon's office.   Constipation Prevention  As directed    Comments:     Drink plenty of fluids.  Prune juice may be helpful.  You may use a stool softener, such as Colace (over the counter) 100 mg twice a day.  Use MiraLax (over the counter) for constipation as needed.   Diet - low sodium heart healthy  As directed    Discharge instructions  As directed    Comments:     Follow up in 2 weeks with Dr. Turner Daniels in office.   Driving restrictions  As directed    Comments:     No driving for 2 weeks   Follow the hip precautions as taught in Physical Therapy  As directed    Increase activity slowly as tolerated  As directed    Patient may shower  As directed    Comments:     You may shower without a dressing once there is no drainage.  Do not wash over the wound.  If drainage remains, cover wound with plastic wrap and then shower.      Follow-up Information   Follow up with Nestor Lewandowsky, MD In 2 weeks.   Specialty:  Orthopedic Surgery   Contact information:   [address] [place] [address] [phone]        Signed: Vear Clock Fritzie Prioleau R 10/03/2012, 7:32 AM   1

## 2012-10-03 NOTE — Progress Notes (Signed)
Orthopedic Tech Progress Note Patient Details:  [name] 1930/12/02 [id number] Ordered hip abduction brace from [employer]. Patient ID: [name], male   DOB: May 15, 1930, 79 y.o.   MRN: [id number]   Lesle Chris 10/03/2012, 11:51 AM

## 2012-10-03 NOTE — Progress Notes (Signed)
Patient to be d/c to [name] on 10/04/12, instead of 10/03/12.   Patient's FL-2 was not signed in time for admissions at [name] to allow for patient to return back to facility.  Dannielle Burn PA aware that patient may not leave today.

## 2012-10-03 NOTE — Op Note (Signed)
NAMEGARETH, [name]             ACCOUNT NO.:  [id number]  MEDICAL RECORD NO.:  [id number]  LOCATION:  5N21C                        FACILITY:  [facility]  PHYSICIAN:  Lubertha Basque. Shaquanta Harkless, M.D.DATE OF BIRTH:  05-Sep-1930  DATE OF PROCEDURE:  10/02/2012 DATE OF DISCHARGE:                              OPERATIVE REPORT   PREOPERATIVE DIAGNOSIS:  Dislocated right total hip replacement.  POSTOPERATIVE DIAGNOSIS:  Dislocated right total hip replacement.  PROCEDURE:  Closed reduction, right total hip replacement dislocation.  ANESTHESIA:  General.  ATTENDING SURGEON:  Lubertha Basque. Jerl Santos, M.D.  INDICATION FOR PROCEDURE:  The patient is an 79 year old man who is a couple years from a hip replacement and about a month from a revision operation for infection and dislocation.  He was sitting on his bed today when he suffered a dislocation at the rehabilitation facility.  He is seen in the emergency room.  He would like as little pain as possible getting this back into place, so we offered to take him to the operating room, with a brief general anesthetic.  Informed operative consent was obtained after discussion of possible complications including reaction to anesthesia and loss of fixation.  SUMMARY OF FINDINGS AND PROCEDURE:  Under a brief general mask anesthetic, we were able to place the hip back into position with some forward flexion and traction.  This came back into place fairly easily. We used fluoroscopy to confirm adequate reduction.  DESCRIPTION OF PROCEDURE:  The patient was in the operating suite where brief general anesthetic was applied.  He was positioned supine.  After appropriate time-out and appropriate relaxation of the patient, I applied some forward flexion and traction on the hip easily came back into position.  Leg lengths were equalized.  He has fluoroscopy to confirm adequate reduction.  I read these views myself.  ESTIMATED BLOOD LOSS:  0.  INTRAOPERATIVE FLUIDS:   Obtained from anesthesia records.  DISPOSITION:  The patient was placed back on a bed and taken to recovery in stable condition.  Plans were for him to be admitted to the Orthopedic Surgery Service for at least overnight observation with probable discharge home in the morning.     Lubertha Basque Jerl Santos, M.D.     PGD/MEDQ  D:  10/02/2012  T:  10/02/2012  Job:  [id number]

## 2012-10-03 NOTE — Clinical Social Work Psychosocial (Addendum)
Clinical Social Work Department BRIEF PSYCHOSOCIAL ASSESSMENT 10/03/2012  Patient:  [name], [name]     Account Number:  [id number]     Admit date:  10/02/2012  Clinical Social Worker:  Delmer Islam  Date/Time:  10/03/2012 05:44 AM  Referred by:  Physician  Date Referred:  10/03/2012 Referred for  SNF Placement   Other Referral:   Interview type:  Other - See comment Other interview type:   Admission's director with [name]    PSYCHOSOCIAL DATA Living Status:  FACILITY Admitted from facility:  [name] Level of care:   Primary support name:  [name] Kirschenbaum Primary support relationship to patient:  SPOUSE Degree of support available:    CURRENT CONCERNS Current Concerns  Post-Acute Placement   Other Concerns:    SOCIAL WORK ASSESSMENT / PLAN CSW talked with admissions director at [facility] regarding patient's return to facility today. Admissions director requested and was sent the d/c summary for patient.    FL-2 completed and was faxed to MD's office as well as placed on chart. MD was in surgery most of the day and called 5N close to 5 pm informing nurse that FL-2 would be signed and faxed. CSW attempted to reach admissions staff at [name] to determine if patient could d/c today, however message left and no response from admissions staff.   Assessment/plan status:  No Further Intervention Required Other assessment/ plan:   Information/referral to community resources:    PATIENT'S/FAMILY'S RESPONSE TO PLAN OF CARE: Patient ready for d/c back to facility and will return to facility on Tuesday, 9/9.

## 2012-10-03 NOTE — Progress Notes (Signed)
PATIENT ID: [name]  MRN: [id number]  DOB/AGE:  10-11-1930 / 79 y.o.  1 Day Post-Op Procedure(s) (LRB): CLOSED REDUCTION HIP (Right)    PROGRESS NOTE Subjective: Patient is alert, oriented,no Nausea, no Vomiting, yes passing gas, no Bowel Movement. Taking PO well. Denies SOB, Chest or Calf Pain. Using Incentive Spirometer, PAS in place. Ambulate wbat Patient reports pain as mild  .    Objective: Vital signs in last 24 hours: Filed Vitals:   10/02/12 1730 10/02/12 1749 10/02/12 2334 10/03/12 0643  BP:  147/67 141/73 140/75  Pulse: 103 107 101 95  Temp:  99.5 F (37.5 C) 99 F (37.2 C) 98.8 F (37.1 C)  TempSrc:  Oral Axillary Oral  Resp: 21 20 20 18   Height:      Weight:      SpO2: 98% 99% 98% 99%      Intake/Output from previous day: I/O last 3 completed shifts: In: 620 [I.V.:620] Out: 0    Intake/Output this shift:     LABORATORY DATA:  Recent Labs  10/02/12 1648 10/02/12 2334 10/03/12 0642  GLUCAP 106* 96 100*    Examination: Neurologically intact ABD soft Neurovascular intact Intact pulses distally Dorsiflexion/Plantar flexion intact No cellulitis present Compartment soft} XR AP&Lat of hip shows well placed\fixed THA  Assessment:   1 Day Post-Op Procedure(s) (LRB): CLOSED REDUCTION HIP (Right) ADDITIONAL DIAGNOSIS:  Diabetes, Hypertension and Cardiac Arrythmia atrial fibrilation  Plan: PT/OT WBAT, THA  posterior precautions  DVT Prophylaxis: SCDx77 hrs, ASA 325 mg BID x 2 weeks  DISCHARGE PLAN: Skilled Nursing Facility/Rehab  DISCHARGE NEEDS: Hip abduction brace from [employer] with 80 degrees flexion and 20 degrees abduction.

## 2012-10-04 LAB — GLUCOSE, CAPILLARY: Glucose-Capillary: 95 mg/dL (ref 70–99)

## 2012-10-04 MED ORDER — HEPARIN SOD (PORK) LOCK FLUSH 100 UNIT/ML IV SOLN
250.0000 [IU] | INTRAVENOUS | Status: AC | PRN
  Administered 2012-10-04: 500 [IU]

## 2012-10-04 NOTE — Progress Notes (Signed)
Patient ID: [name], male   DOB: 09-23-1930, 79 y.o.   MRN: [id number] Subjective: Patient is transferred to skilled nursing facility will occur today. He is resting comfortably in his hinge abduction brace set at 20 abduction with 80 limit flexion. Patient reports no pain at rest or when he is ambulating with a walker. He does not feel sick.  Objective: His right thigh is soft, he has no pain to palpation, no pain actually loading his foot. He remains on IV Ancef and rifampin for the retroperitoneal hematoma that became infected and seeded his right thigh and possibly his right total hip. He is afebrile. Peripheral blood work revealed a white count of 11.8, a sedimentation rate of 135, CRP is 15.1, glucose is 105  Assessment: Status post close reduction of dislocated right total hip that occurred approximately 3 weeks after radical irrigation and debridement with removal of all synovial tissue or possible periprosthetic infection that seeded from a retroperitoneal hematoma that became infected, after his INR went up to 7 on Coumadin.  Plan: Hinge abduction brace for at least 3 months, return to see me in the office in a few weeks for followup sooner if he has any fevers chills or increasing pain. As previously planned at his infection recurs we will remove the right total hip implants.

## 2012-10-04 NOTE — Progress Notes (Signed)
UR COMPLETED  

## 2012-10-04 NOTE — Progress Notes (Signed)
Clinical social worker assisted with patient discharge to skilled nursing facility [name],.  CSW addressed all family questions and concerns. CSW copied chart and added all important documents. CSW also set up patient transportation with [employer]. Clinical Social Worker will sign off for now as social work intervention is no longer needed.   Sabino Niemann, MSW, [employer] [phone]

## 2012-10-04 NOTE — Progress Notes (Signed)
Report called toRomita, RN at [facility]. [facility].

## 2012-10-11 ENCOUNTER — Encounter: Payer: Self-pay | Admitting: Infectious Disease

## 2012-10-20 ENCOUNTER — Encounter: Payer: Self-pay | Admitting: Infectious Disease

## 2012-10-20 ENCOUNTER — Ambulatory Visit (INDEPENDENT_AMBULATORY_CARE_PROVIDER_SITE_OTHER): Payer: Medicare Other | Admitting: Infectious Disease

## 2012-10-20 VITALS — BP 144/79 | HR 99 | Temp 98.5°F | Ht 71.5 in | Wt 220.0 lb

## 2012-10-20 DIAGNOSIS — M009 Pyogenic arthritis, unspecified: Secondary | ICD-10-CM

## 2012-10-20 DIAGNOSIS — R7881 Bacteremia: Secondary | ICD-10-CM

## 2012-10-20 DIAGNOSIS — T889XXS Complication of surgical and medical care, unspecified, sequela: Secondary | ICD-10-CM

## 2012-10-20 DIAGNOSIS — Z23 Encounter for immunization: Secondary | ICD-10-CM

## 2012-10-20 DIAGNOSIS — T827XXS Infection and inflammatory reaction due to other cardiac and vascular devices, implants and grafts, sequela: Secondary | ICD-10-CM

## 2012-10-20 DIAGNOSIS — T827XXA Infection and inflammatory reaction due to other cardiac and vascular devices, implants and grafts, initial encounter: Secondary | ICD-10-CM | POA: Insufficient documentation

## 2012-10-20 DIAGNOSIS — M869 Osteomyelitis, unspecified: Secondary | ICD-10-CM

## 2012-10-20 DIAGNOSIS — A4901 Methicillin susceptible Staphylococcus aureus infection, unspecified site: Secondary | ICD-10-CM

## 2012-10-20 MED ORDER — CEPHALEXIN 500 MG PO CAPS: 500.0000 mg | ORAL_CAPSULE | Freq: Four times a day (QID) | ORAL | Status: DC

## 2012-10-20 NOTE — Patient Instructions (Addendum)
Complete IV ancef thru October 2nd  Pull PICC on the 2nd  Start keflex 500mg  four times daily   With  Rifampin 300mg  twice daily

## 2012-10-20 NOTE — Progress Notes (Signed)
Subjective:    Patient ID: [name], male    DOB: May 03, 1930, 79 y.o.   MRN: [id number]  HPI  79 y.o. male with Pacemaker, Right prosthetic hip, Laminectomy in January of 2014 admitted with right leg pain weakness fevers. He has been found to have MSSA bacteremia and a large retroperitoneal abscess that is adjacent to his prosthetic hip.  Orthopedics performed resection of Radical irrigation and debridement right total hip with removal of all synovium, revision of femoral head, revision of polyethylene liner placement On 09/02/12  TEE failed to show vegetations on valves or overtly on PM. PM is out. He returned to the hospital with dislocation of his hip. This was able to be reduced without surgical intervention. My partner Dr. Ninetta Lights saw the patient and noted his sedimentation rate and C-reactive protein is still be quite high. Patient currently remains on cefazolin 2 g IV every 8 hours at the skilled nursing facility about to finish 8 weeks at next week. He also remains on oral rifampin twice daily. He feels his hip pain is improved as in the back brace at present. Apparently does not be any need for reinsertion of his pacemaker. He has had no fevers chills nausea or malaise.   Review of Systems  Constitutional: Positive for activity change. Negative for fever, chills, diaphoresis, appetite change, fatigue and unexpected weight change.  HENT: Negative for sore throat and trouble swallowing.   Eyes: Negative for photophobia and visual disturbance.  Respiratory: Negative for cough and chest tightness.   Cardiovascular: Positive for palpitations.  Gastrointestinal: Negative for nausea, vomiting, abdominal pain, diarrhea, constipation, blood in stool, abdominal distention and anal bleeding.  Genitourinary: Negative for dysuria, hematuria, flank pain and difficulty urinating.  Musculoskeletal: Positive for myalgias and arthralgias. Negative for back pain and joint swelling.  Skin: Negative for  color change, pallor, rash and wound.  Neurological: Negative for dizziness, tremors, weakness and light-headedness.  Hematological: Negative for adenopathy. Does not bruise/bleed easily.  Psychiatric/Behavioral: Negative for behavioral problems, confusion, sleep disturbance, dysphoric mood, decreased concentration and agitation.       Objective:   Physical Exam  Constitutional: He is oriented to person, place, and time. He appears well-developed and well-nourished. No distress.  HENT:  Head: Normocephalic and atraumatic.  Mouth/Throat: Oropharynx is clear and moist. No oropharyngeal exudate.  Eyes: Conjunctivae and EOM are normal. No scleral icterus.  Neck: Normal range of motion. Neck supple.  Cardiovascular: Normal rate, regular rhythm and normal heart sounds.   Pulmonary/Chest: Effort normal and breath sounds normal. No respiratory distress. He has no wheezes. He has no rales. He exhibits no tenderness.  Abdominal: He exhibits no distension. There is tenderness. There is guarding. There is no rebound.  Musculoskeletal: He exhibits edema. He exhibits no tenderness.  Neurological: He is alert and oriented to person, place, and time. He exhibits normal muscle tone.  Skin: Skin is warm and dry. He is not diaphoretic. No erythema. No pallor.     Psychiatric: He has a normal mood and affect. His behavior is normal. Judgment and thought content normal.          Assessment & Plan:  79 y.o. male with Pacemaker, Right prosthetic hip, Laminectomy in January of 2014 admitted with right leg pain weakness fevers. He has been found to have MSSA bacteremia and a large retroperitoneal abscess that is adjacent to his prosthetic hip.  Orthopedics performed resection of Radical irrigation and debridement right total hip with removal of all synovium, revision of  femoral head, revision of polyethylene liner placement On 09/02/12  TEE failed to show vegetations on valves or overtly on PM. PM is  removed:  By next week he will be at 8 weeks post surgery with HIGH DOSE IV ancef and oral rifampin  At that time I would discontinue his PICC line and started on oral Keflex 500 mg 4 times daily along with continued rifampin 300 mg twice daily.  I like to continue these antibiotics for several months if not a half year or longer given the prosthetic material that may still be present in the hip we'll check a sedimentation and C-reactive protein today.

## 2012-10-21 ENCOUNTER — Emergency Department (HOSPITAL_COMMUNITY)
Admission: EM | Admit: 2012-10-21 | Discharge: 2012-10-22 | Disposition: A | Discharge status: Home / Self Care | Payer: Medicare Other | Attending: Emergency Medicine | Admitting: Emergency Medicine

## 2012-10-21 ENCOUNTER — Emergency Department (HOSPITAL_COMMUNITY): Payer: Medicare Other

## 2012-10-21 DIAGNOSIS — S73004A Unspecified dislocation of right hip, initial encounter: Secondary | ICD-10-CM

## 2012-10-21 DIAGNOSIS — Z888 Allergy status to other drugs, medicaments and biological substances status: Secondary | ICD-10-CM | POA: Insufficient documentation

## 2012-10-21 DIAGNOSIS — Z950 Presence of cardiac pacemaker: Secondary | ICD-10-CM | POA: Insufficient documentation

## 2012-10-21 DIAGNOSIS — I4891 Unspecified atrial fibrillation: Secondary | ICD-10-CM | POA: Insufficient documentation

## 2012-10-21 DIAGNOSIS — R5381 Other malaise: Secondary | ICD-10-CM | POA: Insufficient documentation

## 2012-10-21 DIAGNOSIS — Z8614 Personal history of Methicillin resistant Staphylococcus aureus infection: Secondary | ICD-10-CM | POA: Insufficient documentation

## 2012-10-21 DIAGNOSIS — W19XXXA Unspecified fall, initial encounter: Secondary | ICD-10-CM

## 2012-10-21 DIAGNOSIS — Z794 Long term (current) use of insulin: Secondary | ICD-10-CM | POA: Insufficient documentation

## 2012-10-21 DIAGNOSIS — R296 Repeated falls: Secondary | ICD-10-CM | POA: Insufficient documentation

## 2012-10-21 DIAGNOSIS — Y92009 Unspecified place in unspecified non-institutional (private) residence as the place of occurrence of the external cause: Secondary | ICD-10-CM | POA: Insufficient documentation

## 2012-10-21 DIAGNOSIS — I10 Essential (primary) hypertension: Secondary | ICD-10-CM | POA: Insufficient documentation

## 2012-10-21 DIAGNOSIS — S73036A Other anterior dislocation of unspecified hip, initial encounter: Secondary | ICD-10-CM | POA: Insufficient documentation

## 2012-10-21 DIAGNOSIS — Z96649 Presence of unspecified artificial hip joint: Secondary | ICD-10-CM | POA: Insufficient documentation

## 2012-10-21 DIAGNOSIS — Y9389 Activity, other specified: Secondary | ICD-10-CM | POA: Insufficient documentation

## 2012-10-21 DIAGNOSIS — Z7982 Long term (current) use of aspirin: Secondary | ICD-10-CM | POA: Insufficient documentation

## 2012-10-21 DIAGNOSIS — M129 Arthropathy, unspecified: Secondary | ICD-10-CM | POA: Insufficient documentation

## 2012-10-21 DIAGNOSIS — F411 Generalized anxiety disorder: Secondary | ICD-10-CM | POA: Insufficient documentation

## 2012-10-21 DIAGNOSIS — F329 Major depressive disorder, single episode, unspecified: Secondary | ICD-10-CM | POA: Insufficient documentation

## 2012-10-21 DIAGNOSIS — E119 Type 2 diabetes mellitus without complications: Secondary | ICD-10-CM | POA: Insufficient documentation

## 2012-10-21 DIAGNOSIS — Z79899 Other long term (current) drug therapy: Secondary | ICD-10-CM | POA: Insufficient documentation

## 2012-10-21 DIAGNOSIS — R269 Unspecified abnormalities of gait and mobility: Secondary | ICD-10-CM | POA: Insufficient documentation

## 2012-10-21 DIAGNOSIS — F3289 Other specified depressive episodes: Secondary | ICD-10-CM | POA: Insufficient documentation

## 2012-10-21 LAB — BASIC METABOLIC PANEL
CO2: 30 mEq/L (ref 19–32)
Glucose, Bld: 122 mg/dL — ABNORMAL HIGH (ref 70–99)
Potassium: 4.2 mEq/L (ref 3.5–5.1)
Sodium: 144 mEq/L (ref 135–145)

## 2012-10-21 MED ORDER — ASPIRIN 300 MG RE SUPP
300.0000 mg | Freq: Once | RECTAL | Status: DC
  Filled 2012-10-21: qty 1

## 2012-10-21 MED ORDER — PROPOFOL 10 MG/ML IV BOLUS
50.0000 mg | Freq: Once | INTRAVENOUS | Status: AC
  Administered 2012-10-22: 50 mg via INTRAVENOUS
  Administered 2012-10-22: 20 mg via INTRAVENOUS
  Filled 2012-10-21: qty 20

## 2012-10-21 NOTE — ED Notes (Signed)
Patient transported to X-ray 

## 2012-10-21 NOTE — ED Notes (Addendum)
Per EMS: Pt fell from standing today, now with shortening to right leg and right hip pain. Pt being tx at rehabilitation center for recent displacement of right hip and abscess removal. Pt AO x4. 90 A fib ( hx of the same).  100 mc fentanyl given.

## 2012-10-21 NOTE — ED Provider Notes (Signed)
CSN: [id number]     Arrival date & time 10/21/12  2052 History   First MD Initiated Contact with Patient 10/21/12 2055     Chief Complaint  Patient presents with  . Hip Pain   (Consider location/radiation/quality/duration/timing/severity/associated sxs/prior Treatment) HPI Comments: 79 yo male with hip replacement and dislocation hx, bacteremia, abscess, on abx, in rehab, picc line presents with mild right hip pain since falling from standing today.  No sxs prior.  Pt was trying to maneuver and lost step, has brace. No head injury, landed on buttocks.  No fevers.  Pt feels at baseline otherwise, mild right hip pain with rom.   Patient is a 79 y.o. male presenting with hip pain. The history is provided by the patient.  Hip Pain This is a recurrent problem. Pertinent negatives include no chest pain, no abdominal pain, no headaches and no shortness of breath.    Past Medical History  Diagnosis Date  . Atrial fibrillation   . Hypertension   . Pacemaker   . Diabetes mellitus without complication     Borderline  . Arthritis   . Anxiety   . Depression   . MSSA (methicillin susceptible Staphylococcus aureus) infection   . Bacteremia   . Infected pacemaker   . Septic hip    Past Surgical History  Procedure Laterality Date  . Pacemaker insertion    . Lumbar laminectomy/decompression microdiscectomy  02/14/2012    Procedure: LUMBAR LAMINECTOMY/DECOMPRESSION MICRODISCECTOMY 1 LEVEL;  Surgeon: Tia Alert, MD;  Location: [facility] NEURO ORS;  Service: Neurosurgery;  Laterality: N/A;  Thoracic twelve - Lumbar one decompressive laminectomy.  Rhae Hammock without cardioversion N/A 08/30/2012    Procedure: TRANSESOPHAGEAL ECHOCARDIOGRAM (TEE);  Surgeon: Laurey Morale, MD;  Location: [facility] ENDOSCOPY;  Service: Cardiovascular;  Laterality: N/A;  . Icd lead removal Left 08/31/2012    Procedure: ICD LEAD REMOVAL;  Surgeon: Marinus Maw, MD;  Location[facility] OR;  Service: Cardiovascular;  Laterality: Left;  . Incision  and drainage hip Right 09/02/2012    Procedure: IRRIGATION AND DEBRIDEMENT RIGHT HIP;  Surgeon: Nestor Lewandowsky, MD;  Location: [facility] OR;  Service: Orthopedics;  Laterality: Right;  . Total hip revision Right 09/02/2012    Procedure: REVISION OF POLYETHYLENE LINER AND FEMORAL HEAD ;  Surgeon: Nestor Lewandowsky, MD;  Location: [facility] OR;  Service: Orthopedics;  Laterality: Right;  . Hip closed reduction Right 10/02/2012    Procedure: CLOSED REDUCTION HIP;  Surgeon: Velna Ochs, MD;  Location: [facility] OR;  Service: Orthopedics;  Laterality: Right;   No family history on file. History  Substance Use Topics  . Smoking status: Never Smoker   . Smokeless tobacco: Not on file  . Alcohol Use: No    Review of Systems  Constitutional: Positive for fatigue. Negative for fever and chills.  HENT: Negative for neck pain and neck stiffness.   Eyes: Negative for visual disturbance.  Respiratory: Negative for shortness of breath.   Cardiovascular: Negative for chest pain.  Gastrointestinal: Negative for vomiting and abdominal pain.  Genitourinary: Negative for dysuria and flank pain.  Musculoskeletal: Positive for arthralgias and gait problem. Negative for back pain.  Neurological: Negative for light-headedness and headaches.    Allergies  Ambien  Home Medications   Current Outpatient Rx  Name  Route  Sig  Dispense  Refill  . aspirin 325 MG tablet   Oral   Take 1 tablet (325 mg total) by mouth daily.   90 tablet   0   .  bisacodyl (DULCOLAX) 5 MG EC tablet   Oral   Take 5 mg by mouth daily as needed for constipation.         . Coenzyme Q10 200 MG capsule   Oral   Take 200 mg by mouth daily.         Marland Kitchen diltiazem (CARDIZEM CD) 120 MG 24 hr capsule   Oral   Take 1 capsule (120 mg total) by mouth daily.   90 capsule   0   . docusate sodium 100 MG CAPS   Oral   Take 100 mg by mouth 2 (two) times daily.   10 capsule   0   . feeding supplement (PRO-STAT SUGAR FREE 64) LIQD   Oral   Take 30 mLs  by mouth daily.         . ferrous sulfate 325 (65 FE) MG tablet   Oral   Take 325 mg by mouth daily with breakfast.         . finasteride (PROSCAR) 5 MG tablet   Oral   Take 5 mg by mouth daily.           . furosemide (LASIX) 40 MG tablet   Oral   Take 40 mg by mouth daily.           . insulin aspart (NOVOLOG) 100 UNIT/ML injection   Subcutaneous   Inject 2-12 Units into the skin 3 (three) times daily with meals. Sliding Scale Insulin 151-200=2 units; 201-250=4 units; 251-300=6 units; 301-350=8 units; 351-400=10 units; >400=12 units and call doctor         . levothyroxine (SYNTHROID, LEVOTHROID) 88 MCG tablet   Oral   Take 88 mcg by mouth daily before breakfast.         . lovastatin (MEVACOR) 20 MG tablet   Oral   Take 40 mg by mouth at bedtime.           . Melatonin 3 MG TABS   Oral   Take 3 mg by mouth at bedtime.         . metoprolol succinate (TOPROL-XL) 25 MG 24 hr tablet   Oral   Take 12.5 mg by mouth daily.         . pioglitazone (ACTOS) 15 MG tablet   Oral   Take 1 tablet by mouth daily.         . polyethylene glycol (MIRALAX / GLYCOLAX) packet   Oral   Take 17 g by mouth daily.         . potassium chloride SA (K-DUR,KLOR-CON) 20 MEQ tablet   Oral   Take 20 mEq by mouth daily.         . rifampin (RIFADIN) 300 MG capsule   Oral   Take 1 capsule (300 mg total) by mouth every 12 (twelve) hours.   90 capsule   0   . vitamin C (ASCORBIC ACID) 500 MG tablet   Oral   Take 1,000 mg by mouth daily.          BP 123/77  Pulse 77  Temp(Src) 98.5 F (36.9 C) (Oral)  Resp 19  SpO2 98% Physical Exam  Nursing note and vitals reviewed. Constitutional: He is oriented to person, place, and time. He appears well-developed and well-nourished.  HENT:  Head: Normocephalic and atraumatic.  Eyes: Conjunctivae are normal. Right eye exhibits no discharge. Left eye exhibits no discharge.  Neck: Normal range of motion. Neck supple. No  tracheal deviation present.  Cardiovascular:  Normal rate and regular rhythm.   Pulmonary/Chest: Effort normal and breath sounds normal.  Abdominal: Soft. He exhibits no distension. There is no tenderness. There is no guarding.  Musculoskeletal: He exhibits edema and tenderness.  Mild right hip pain with rom, mild shortening, brace in place, mild leg swelling bilateral No midline lumbar pain Right shortened leg improved post reduction  Neurological: He is alert and oriented to person, place, and time.  Skin: Skin is warm. No rash noted.  Psychiatric: He has a normal mood and affect.    ED Course  Reduction of dislocation Date/Time: 10/22/2012 1:32 AM Performed by: Enid Skeens Authorized by: Enid Skeens Consent: Verbal consent obtained. written consent obtained. Consent given by: patient Patient understanding: patient states understanding of the procedure being performed Patient consent: the patient's understanding of the procedure matches consent given Procedure consent: procedure consent matches procedure scheduled Relevant documents: relevant documents present and verified Test results: test results available and properly labeled Imaging studies: imaging studies available Patient identity confirmed: verbally with patient and arm band Time out: Immediately prior to procedure a "time out" was called to verify the correct patient, procedure, equipment, support staff and site/side marked as required. Preparation: Patient was prepped and draped in the usual sterile fashion. Patient sedated: yes Sedation type: moderate (conscious) sedation Sedatives: propofol Analgesia: morphine Vitals: Vital signs were monitored during sedation. Patient tolerance: Patient tolerated the procedure well with no immediate complications.   (including critical care time) Labs Review Labs Reviewed  BASIC METABOLIC PANEL - Abnormal; Notable for the following:    Glucose, Bld 122 (*)    GFR calc  non Af Amer 64 (*)    GFR calc Af Amer 74 (*)    All other components within normal limits  TROPONIN I   Imaging Review Dg Chest 1 View  10/21/2012   *RADIOLOGY REPORT*  Clinical Data: Fall with right hip dislocation.  CHEST - 1 VIEW  Comparison: 09/01/2012 and prior chest radiographs dating back to 11/11/2010  Findings: A right PICC line is identified with tip difficult to visualize but appears to overlie the lower SVC. Mild pulmonary vascular congestion is present. Mild peribronchial thickening is unchanged. The cardiomediastinal silhouette is unremarkable. There is no evidence of focal airspace disease, pulmonary edema, suspicious pulmonary nodule/mass, pleural effusion, or pneumothorax. No acute bony abnormalities are identified.  IMPRESSION: Mild pulmonary vascular congestion.   Original Report Authenticated By: Harmon Pier, M.D.   Dg Pelvis 1-2 Views  10/21/2012   *RADIOLOGY REPORT*  Clinical Data: Fall with right hip pain.  PELVIS - 1-2 VIEW  Comparison: None  Findings: Superior dislocation of the right total hip femoral prosthesis is noted. No definite acute fracture is noted. Moderate to severe degenerative changes of the lower lumbar spine present. No focal bony lesions are identified.  IMPRESSION: Superior dislocation of the right total hip femoral prosthesis.   Original Report Authenticated By: Harmon Pier, M.D.   Dg Hip Complete Right  10/21/2012   *RADIOLOGY REPORT*  Clinical Data: Right hip pain.  RIGHT HIP - COMPLETE 2+ VIEW  Comparison: 10/02/2012  Findings: Posterior-superior dislocation of the right total hip femoral prosthesis is noted. No acute fracture is identified. No other complicating hardware features are identified.  IMPRESSION: Posterior superior dislocation of the right total hip femoral prosthesis.   Original Report Authenticated By: Harmon Pier, M.D.    MDM  No diagnosis found. Low risk fall, pt recalls details.  Clinically right hip dislocated, confirmed by  radiology.  Xray reviewed, posterior dislocation r hip.  Spoke with ortho on call, he recommends we try and reduce it in the ER so pt can go back to rehab and if unable will admit. Discussed r/b of procedure and procedural sedation with family and patient.  Procedural sedation Performed by: Enid Skeens Consent: Verbal consent obtained. Risks and benefits: risks, benefits and alternatives were discussed Required items: required blood products, implants, devices, and special equipment available Patient identity confirmed: arm band and provided demographic data Time out: Immediately prior to procedure a "time out" was called to verify the correct patient, procedure, equipment, support staff and site  Sedation type: moderate (conscious) sedation NPO time confirmed, risks discussed  Sedatives: propofol  Physician Time at Bedside: 15 min  Vitals: Vital signs were monitored during sedation. Cardiac Monitor, pulse oximeter Patient tolerance: Patient tolerated the procedure well with no immediate complications. Comments: Pt with uneventful recovered. Returned to pre-procedural sedation baseline   Right hip relocation attempt.  Hip went in and then went immediately popped out, second attempt done To reposition hip.   Post xray reviewed, hip in place.   DC to rehab facility with ortho fup outpt.         Enid Skeens, MD 10/22/12 [phone]

## 2012-10-22 ENCOUNTER — Emergency Department (HOSPITAL_COMMUNITY): Payer: Medicare Other

## 2012-10-22 MED ORDER — MORPHINE SULFATE 4 MG/ML IJ SOLN
4.0000 mg | Freq: Once | INTRAMUSCULAR | Status: AC
  Administered 2012-10-22: 4 mg via INTRAVENOUS
  Filled 2012-10-22: qty 1

## 2012-10-22 NOTE — ED Notes (Signed)
Dr. Ladona Ridgel reduced R hip, popped in and then out.

## 2012-10-22 NOTE — ED Notes (Signed)
VSS, no changes, pt sedated, arousable to voice answers questions, singing spontaneously, pending arrival of portable xray.

## 2012-10-22 NOTE — ED Notes (Signed)
To rehab facility

## 2012-10-22 NOTE — ED Notes (Signed)
Pt resting/ sleeping, arousable to voice, VSS.

## 2012-10-22 NOTE — ED Notes (Signed)
Xray present, VSS, pt singing, interactive, answering questions appropriately.

## 2012-10-22 NOTE — Progress Notes (Signed)
Orthopedic Tech Progress Note Patient Details:  [name] July 01, 1930 [id number]  Ortho Devices Type of Ortho Device: Knee Immobilizer Ortho Device/Splint Location: right LE Ortho Device/Splint Interventions: Application   [name] T 10/22/2012, 3:01 AM

## 2012-10-22 NOTE — ED Notes (Signed)
EDP at [facility], updating pt & family.

## 2012-10-22 NOTE — Discharge Instructions (Signed)
If you were given medicines take as directed.  If you are on coumadin or contraceptives realize their levels and effectiveness is altered by many different medicines.  If you have any reaction (rash, tongues swelling, other) to the medicines stop taking and see a physician.   Please follow up as directed and return to the ER or see a physician for new or worsening symptoms.  Thank you. See ortho for follow up next week.   Hip Dislocation A hip dislocation happens when your thigh bone (the ball) separates from your hip bone (the socket). Hip dislocation is an emergency. If you think you have a hip dislocation and cannot move your leg, get help right away. Do not try to move.  Your doctor will put your thigh bone back in the joint (the ball back in the socket). Sometimes this can be done without surgery. Surgery may be needed if blood vessels or nerves are damaged, or if the ball cannot be put back into the socket by hand. HOME CARE  Rest your injured joint. Do not move it.  Avoid the activity that caused your injury.  Put ice on your injured joint for 1 to 2 days or as told by your doctor.  Put ice in a plastic bag.  Place a towel between your skin and the bag.  Leave the ice on for 15 to 20 minutes, every 2 hours while you are awake.  Use crutches or a walker as told by your doctor.  Exercise your hip and leg as told by your doctor.  Only take medicines as told by your doctor. GET HELP RIGHT AWAY IF:  Your pain gets worse, not better.  You feel like your hip has dislocated again. MAKE SURE YOU:  Understand these instructions.  Will watch your condition.  Will get help right away if you are not doing well or get worse. Document Released: 09/10/2010 Document Revised: 04/06/2011 Document Reviewed: 09/10/2010 [facility] Patient Information 2014 [place], [place].

## 2012-10-22 NOTE — ED Notes (Signed)
Cross table R hip complete, confirmed reduced at [facility] per Drs Ladona Ridgel and zavitz, pending reading.

## 2012-10-22 NOTE — ED Notes (Signed)
Family back at [facility], pedal pulses palpable, DPs & PTs.

## 2012-10-22 NOTE — ED Notes (Signed)
2nd attempt at reduction by Dr. Jodi Mourning, xray called to confirm placement. Pt responding to voice, opens eyes on command.

## 2012-10-22 NOTE — ED Notes (Signed)
Drs. Jodi Mourning and Ladona Ridgel at [facility] for xray, result reviewed at [facility], cross table ordered for confirmation. Pt alert, NAD, calm, interactive, spontaneous. Pt states, "it feels better".

## 2012-10-22 NOTE — ED Notes (Signed)
Daughter at [facility], wife left for home, [facility] called for transport, knee immobilizer and back/hip brace in place, VSS, d/c instructions reviewed, pt to return to rehab facility.

## 2012-10-22 NOTE — ED Notes (Signed)
Consent signed by wife. EDP in to speak with pt and family.

## 2012-10-22 NOTE — ED Notes (Signed)
Ortho paged for knee immobilizer

## 2012-11-23 ENCOUNTER — Emergency Department (HOSPITAL_COMMUNITY)
Admission: EM | Admit: 2012-11-23 | Discharge: 2012-11-23 | Disposition: A | Discharge status: Home / Self Care | Payer: Medicare Other | Attending: Emergency Medicine | Admitting: Emergency Medicine

## 2012-11-23 ENCOUNTER — Emergency Department (HOSPITAL_COMMUNITY): Payer: Medicare Other

## 2012-11-23 ENCOUNTER — Encounter (HOSPITAL_COMMUNITY): Payer: Self-pay | Admitting: Emergency Medicine

## 2012-11-23 DIAGNOSIS — Y9389 Activity, other specified: Secondary | ICD-10-CM | POA: Insufficient documentation

## 2012-11-23 DIAGNOSIS — Z7982 Long term (current) use of aspirin: Secondary | ICD-10-CM | POA: Insufficient documentation

## 2012-11-23 DIAGNOSIS — I4891 Unspecified atrial fibrillation: Secondary | ICD-10-CM | POA: Insufficient documentation

## 2012-11-23 DIAGNOSIS — S91119A Laceration without foreign body of unspecified toe without damage to nail, initial encounter: Secondary | ICD-10-CM

## 2012-11-23 DIAGNOSIS — Z8659 Personal history of other mental and behavioral disorders: Secondary | ICD-10-CM | POA: Insufficient documentation

## 2012-11-23 DIAGNOSIS — Z79899 Other long term (current) drug therapy: Secondary | ICD-10-CM | POA: Insufficient documentation

## 2012-11-23 DIAGNOSIS — W010XXA Fall on same level from slipping, tripping and stumbling without subsequent striking against object, initial encounter: Secondary | ICD-10-CM | POA: Insufficient documentation

## 2012-11-23 DIAGNOSIS — Z950 Presence of cardiac pacemaker: Secondary | ICD-10-CM | POA: Insufficient documentation

## 2012-11-23 DIAGNOSIS — Z96649 Presence of unspecified artificial hip joint: Secondary | ICD-10-CM | POA: Insufficient documentation

## 2012-11-23 DIAGNOSIS — Y9289 Other specified places as the place of occurrence of the external cause: Secondary | ICD-10-CM | POA: Insufficient documentation

## 2012-11-23 DIAGNOSIS — T84029A Dislocation of unspecified internal joint prosthesis, initial encounter: Secondary | ICD-10-CM | POA: Insufficient documentation

## 2012-11-23 DIAGNOSIS — E119 Type 2 diabetes mellitus without complications: Secondary | ICD-10-CM | POA: Insufficient documentation

## 2012-11-23 DIAGNOSIS — Z8614 Personal history of Methicillin resistant Staphylococcus aureus infection: Secondary | ICD-10-CM | POA: Insufficient documentation

## 2012-11-23 DIAGNOSIS — M129 Arthropathy, unspecified: Secondary | ICD-10-CM | POA: Insufficient documentation

## 2012-11-23 DIAGNOSIS — S73004A Unspecified dislocation of right hip, initial encounter: Secondary | ICD-10-CM

## 2012-11-23 DIAGNOSIS — E876 Hypokalemia: Secondary | ICD-10-CM | POA: Insufficient documentation

## 2012-11-23 DIAGNOSIS — I10 Essential (primary) hypertension: Secondary | ICD-10-CM | POA: Insufficient documentation

## 2012-11-23 DIAGNOSIS — S91109A Unspecified open wound of unspecified toe(s) without damage to nail, initial encounter: Secondary | ICD-10-CM | POA: Insufficient documentation

## 2012-11-23 DIAGNOSIS — Z794 Long term (current) use of insulin: Secondary | ICD-10-CM | POA: Insufficient documentation

## 2012-11-23 LAB — COMPREHENSIVE METABOLIC PANEL
AST: 30 U/L (ref 0–37)
Albumin: 2.9 g/dL — ABNORMAL LOW (ref 3.5–5.2)
BUN: 13 mg/dL (ref 6–23)
CO2: 24 mEq/L (ref 19–32)
Calcium: 9.1 mg/dL (ref 8.4–10.5)
Chloride: 102 mEq/L (ref 96–112)
Creatinine, Ser: 0.94 mg/dL (ref 0.50–1.35)
GFR calc non Af Amer: 77 mL/min — ABNORMAL LOW (ref >90)
Sodium: 139 mEq/L (ref 135–145)

## 2012-11-23 LAB — CBC WITH DIFFERENTIAL/PLATELET
Basophils Absolute: 0 10*3/uL (ref 0.0–0.1)
Basophils Relative: 0 % (ref 0–1)
Eosinophils Relative: 1 % (ref 0–5)
HCT: 32.3 % — ABNORMAL LOW (ref 39.0–52.0)
MCHC: 32.8 g/dL (ref 30.0–36.0)
MCV: 96.4 fL (ref 78.0–100.0)
Monocytes Absolute: 0.7 10*3/uL (ref 0.1–1.0)
Neutro Abs: 6 10*3/uL (ref 1.7–7.7)
RDW: 15.1 % (ref 11.5–15.5)

## 2012-11-23 MED ORDER — POTASSIUM CHLORIDE CRYS ER 20 MEQ PO TBCR
40.0000 meq | EXTENDED_RELEASE_TABLET | Freq: Once | ORAL | Status: AC
  Administered 2012-11-23: 40 meq via ORAL
  Filled 2012-11-23: qty 2

## 2012-11-23 MED ORDER — KETAMINE HCL 10 MG/ML IJ SOLN
50.0000 mg | Freq: Once | INTRAMUSCULAR | Status: AC
  Administered 2012-11-23: 30 mg via INTRAVENOUS

## 2012-11-23 MED ORDER — HYDROCODONE-ACETAMINOPHEN 5-325 MG PO TABS: 1.0000 | ORAL_TABLET | Freq: Four times a day (QID) | ORAL | Status: DC | PRN

## 2012-11-23 MED ORDER — SODIUM CHLORIDE 0.9 % IV BOLUS (SEPSIS)
250.0000 mL | Freq: Once | INTRAVENOUS | Status: AC
  Administered 2012-11-23: 250 mL via INTRAVENOUS

## 2012-11-23 MED ORDER — PROPOFOL 10 MG/ML IV BOLUS
50.0000 mg | Freq: Once | INTRAVENOUS | Status: AC
  Administered 2012-11-23: 30 mg via INTRAVENOUS
  Filled 2012-11-23: qty 20

## 2012-11-23 MED ORDER — POTASSIUM CHLORIDE CRYS ER 20 MEQ PO TBCR: 20.0000 meq | EXTENDED_RELEASE_TABLET | Freq: Two times a day (BID) | ORAL | Status: DC

## 2012-11-23 MED ORDER — FENTANYL CITRATE 0.05 MG/ML IJ SOLN
50.0000 ug | Freq: Once | INTRAMUSCULAR | Status: AC
  Administered 2012-11-23: 50 ug via INTRAVENOUS
  Filled 2012-11-23: qty 2

## 2012-11-23 MED ORDER — ONDANSETRON HCL 4 MG/2ML IJ SOLN
4.0000 mg | Freq: Once | INTRAMUSCULAR | Status: AC
  Administered 2012-11-23: 4 mg via INTRAVENOUS
  Filled 2012-11-23: qty 2

## 2012-11-23 MED ORDER — SODIUM CHLORIDE 0.9 % IV SOLN: INTRAVENOUS | Status: DC

## 2012-11-23 MED ORDER — FENTANYL CITRATE 0.05 MG/ML IJ SOLN
50.0000 ug | Freq: Once | INTRAMUSCULAR | Status: AC
  Administered 2012-11-23: 50 ug via INTRAVENOUS

## 2012-11-23 NOTE — ED Notes (Signed)
Pt continues to wait for [facility] to transport home

## 2012-11-23 NOTE — ED Notes (Signed)
Vital signs stable. 

## 2012-11-23 NOTE — ED Provider Notes (Signed)
CSN: [id number]     Arrival date & time 11/23/12  0807 History   First MD Initiated Contact with Patient 11/23/12 0815     Chief Complaint  Patient presents with  . Fall   (Consider location/radiation/quality/duration/timing/severity/associated sxs/prior Treatment) Patient is a 79 y.o. male presenting with fall. The history is provided by the patient, the spouse and the EMS personnel.  Fall Pertinent negatives include no chest pain, no abdominal pain, no headaches and no shortness of breath.   patient with a complicated medical history. Patient status post right hip replacement in 2012. In July of 2014 developed a staph infection in that area. Also required removal of his pacemaker. Patient then in September while in rehabilitation started to have trouble with posterior dislocation of the right hip this is the third time since September. Patient's orthopedic Dr. is Dr. Turner Daniels.  Today patient was waiting cell phone scale he slipped off the scale resulting in a fall he got a cut to the bottom of his right second toe and 2 when he fell his hip came out. Was not able to get back up. Patient states that the right hip pain is 10 out of 10. He has been using a brace to prevent this but he had it on when this occurred. Did not hit his head no loss of consciousness. Patient's tetanus is up-to-date.  Past Medical History  Diagnosis Date  . Atrial fibrillation   . Hypertension   . Pacemaker   . Diabetes mellitus without complication     Borderline  . Arthritis   . Anxiety   . Depression   . MSSA (methicillin susceptible Staphylococcus aureus) infection   . Bacteremia   . Infected pacemaker   . Septic hip    Past Surgical History  Procedure Laterality Date  . Pacemaker insertion    . Lumbar laminectomy/decompression microdiscectomy  02/14/2012    Procedure: LUMBAR LAMINECTOMY/DECOMPRESSION MICRODISCECTOMY 1 LEVEL;  Surgeon: Tia Alert, MD;  Location: [facility] NEURO ORS;  Service: Neurosurgery;   Laterality: N/A;  Thoracic twelve - Lumbar one decompressive laminectomy.  Rhae Hammock without cardioversion N/A 08/30/2012    Procedure: TRANSESOPHAGEAL ECHOCARDIOGRAM (TEE);  Surgeon: Laurey Morale, MD;  Location: [facility] ENDOSCOPY;  Service: Cardiovascular;  Laterality: N/A;  . Icd lead removal Left 08/31/2012    Procedure: ICD LEAD REMOVAL;  Surgeon: Marinus Maw, MD;  Location: [facility] OR;  Service: Cardiovascular;  Laterality: Left;  . Incision and drainage hip Right 09/02/2012    Procedure: IRRIGATION AND DEBRIDEMENT RIGHT HIP;  Surgeon: Nestor Lewandowsky, MD;  Location: [facility] OR;  Service: Orthopedics;  Laterality: Right;  . Total hip revision Right 09/02/2012    Procedure: REVISION OF POLYETHYLENE LINER AND FEMORAL HEAD ;  Surgeon: Nestor Lewandowsky, MD;  Location: [facility] OR;  Service: Orthopedics;  Laterality: Right;  . Hip closed reduction Right 10/02/2012    Procedure: CLOSED REDUCTION HIP;  Surgeon: Velna Ochs, MD;  Location: [facility] OR;  Service: Orthopedics;  Laterality: Right;   No family history on file. History  Substance Use Topics  . Smoking status: Never Smoker   . Smokeless tobacco: Not on file  . Alcohol Use: No    Review of Systems  Constitutional: Negative for fever.  HENT: Negative for congestion.   Eyes: Negative for pain.  Respiratory: Negative for shortness of breath.   Cardiovascular: Negative for chest pain.  Gastrointestinal: Negative for abdominal pain.  Genitourinary: Negative for dysuria.  Musculoskeletal: Negative for back pain.  Skin: Positive for wound.  Neurological: Negative for headaches.  Hematological: Does not bruise/bleed easily.  Psychiatric/Behavioral: Negative for confusion.    Allergies  Ambien  Home Medications   Current Outpatient Rx  Name  Route  Sig  Dispense  Refill  . aspirin 325 MG tablet   Oral   Take 1 tablet (325 mg total) by mouth daily.   90 tablet   0   . Coenzyme Q10 200 MG capsule   Oral   Take 200 mg by mouth daily.         Marland Kitchen  diltiazem (CARDIZEM CD) 120 MG 24 hr capsule   Oral   Take 1 capsule (120 mg total) by mouth daily.   90 capsule   0   . docusate sodium 100 MG CAPS   Oral   Take 100 mg by mouth 2 (two) times daily.   10 capsule   0   . ferrous sulfate 325 (65 FE) MG tablet   Oral   Take 325 mg by mouth daily with breakfast.         . finasteride (PROSCAR) 5 MG tablet   Oral   Take 5 mg by mouth daily.           . furosemide (LASIX) 40 MG tablet   Oral   Take 40 mg by mouth daily.           . insulin aspart (NOVOLOG) 100 UNIT/ML injection   Subcutaneous   Inject 2-12 Units into the skin 3 (three) times daily with meals. Sliding Scale Insulin 151-200=2 units; 201-250=4 units; 251-300=6 units; 301-350=8 units; 351-400=10 units; >400=12 units and call doctor         . levothyroxine (SYNTHROID, LEVOTHROID) 88 MCG tablet   Oral   Take 88 mcg by mouth daily before breakfast.         . lovastatin (MEVACOR) 20 MG tablet   Oral   Take 40 mg by mouth at bedtime.           . Melatonin 3 MG TABS   Oral   Take 3 mg by mouth at bedtime.         . metoprolol succinate (TOPROL-XL) 25 MG 24 hr tablet   Oral   Take 12.5 mg by mouth daily.         . pioglitazone (ACTOS) 15 MG tablet   Oral   Take 1 tablet by mouth daily.         . polyethylene glycol (MIRALAX / GLYCOLAX) packet   Oral   Take 17 g by mouth daily.         . potassium chloride SA (K-DUR,KLOR-CON) 20 MEQ tablet   Oral   Take 20 mEq by mouth daily.         . rifampin (RIFADIN) 300 MG capsule   Oral   Take 1 capsule (300 mg total) by mouth every 12 (twelve) hours.   90 capsule   0   . vitamin C (ASCORBIC ACID) 500 MG tablet   Oral   Take 1,000 mg by mouth daily.         . bisacodyl (DULCOLAX) 5 MG EC tablet   Oral   Take 5 mg by mouth daily as needed for constipation.          BP 159/95  Pulse 77  Temp(Src) 98.2 F (36.8 C) (Oral)  Resp 11  Ht 5\' 11"  (1.803 m)  Wt 215 lb (97.523 kg)   BMI  30 kg/m2  SpO2 100% Physical Exam  Nursing note and vitals reviewed. Constitutional: He is oriented to person, place, and time. He appears well-developed and well-nourished. No distress.  HENT:  Head: Normocephalic and atraumatic.  Mouth/Throat: Oropharynx is clear and moist.  Eyes: Conjunctivae and EOM are normal. Pupils are equal, round, and reactive to light.  Neck: Normal range of motion.  Cardiovascular: Normal rate, regular rhythm and normal heart sounds.   Pulmonary/Chest: Effort normal and breath sounds normal. No respiratory distress.  Abdominal: Soft. Bowel sounds are normal. There is no tenderness.  Musculoskeletal:  Normal except for right hip with deformity. Right leg was shortened and rotated internally. Patient also to his second toe on the right foot at the junction between the phalanx and the metatarsal bone there is an avulsion laceration measuring about a centimeter no deep structure involvement. When the toe is in a special position that area is closed bleeding controlled. No evidence of bony deformity.  Neurological: He is alert and oriented to person, place, and time. No cranial nerve deficit. Coordination normal.  Skin: Skin is warm. No rash noted.    ED Course  Procedures (including critical care time) Labs Review Labs Reviewed  CBC WITH DIFFERENTIAL - Abnormal; Notable for the following:    RBC 3.35 (*)    Hemoglobin 10.6 (*)    HCT 32.3 (*)    Neutrophils Relative % 81 (*)    Lymphocytes Relative 9 (*)    All other components within normal limits  COMPREHENSIVE METABOLIC PANEL - Abnormal; Notable for the following:    Potassium 3.0 (*)    Glucose, Bld 105 (*)    Albumin 2.9 (*)    GFR calc non Af Amer 77 (*)    GFR calc Af Amer 89 (*)    All other components within normal limits   Results for orders placed during the hospital encounter of 11/23/12  CBC WITH DIFFERENTIAL      Result Value Range   WBC 7.4  4.0 - 10.5 K/uL   RBC 3.35 (*) 4.22 -  5.81 MIL/uL   Hemoglobin 10.6 (*) 13.0 - 17.0 g/dL   HCT 14.7 (*) 82.9 - 56.2 %   MCV 96.4  78.0 - 100.0 fL   MCH 31.6  26.0 - 34.0 pg   MCHC 32.8  30.0 - 36.0 g/dL   RDW 13.0  86.5 - 78.4 %   Platelets 177  150 - 400 K/uL   Neutrophils Relative % 81 (*) 43 - 77 %   Neutro Abs 6.0  1.7 - 7.7 K/uL   Lymphocytes Relative 9 (*) 12 - 46 %   Lymphs Abs 0.7  0.7 - 4.0 K/uL   Monocytes Relative 9  3 - 12 %   Monocytes Absolute 0.7  0.1 - 1.0 K/uL   Eosinophils Relative 1  0 - 5 %   Eosinophils Absolute 0.0  0.0 - 0.7 K/uL   Basophils Relative 0  0 - 1 %   Basophils Absolute 0.0  0.0 - 0.1 K/uL  COMPREHENSIVE METABOLIC PANEL      Result Value Range   Sodium 139  135 - 145 mEq/L   Potassium 3.0 (*) 3.5 - 5.1 mEq/L   Chloride 102  96 - 112 mEq/L   CO2 24  19 - 32 mEq/L   Glucose, Bld 105 (*) 70 - 99 mg/dL   BUN 13  6 - 23 mg/dL   Creatinine, Ser 6.96  0.50 - 1.35 mg/dL  Calcium 9.1  8.4 - 10.5 mg/dL   Total Protein 7.2  6.0 - 8.3 g/dL   Albumin 2.9 (*) 3.5 - 5.2 g/dL   AST 30  0 - 37 U/L   ALT 35  0 - 53 U/L   Alkaline Phosphatase 79  39 - 117 U/L   Total Bilirubin 0.6  0.3 - 1.2 mg/dL   GFR calc non Af Amer 77 (*) >90 mL/min   GFR calc Af Amer 89 (*) >90 mL/min    Imaging Review Dg Hip Bilateral W/pelvis  11/23/2012   CLINICAL DATA:  Right hip pain.  EXAM: BILATERAL HIP WITH PELVIS - 4+ VIEW  COMPARISON:  10/21/2012.  FINDINGS: The right femoral prosthesis is dislocated posteriorly. No acute fracture.  IMPRESSION: Dislocation of the femoral prosthesis posteriorly.  No acute fracture.   Electronically Signed   By: Loralie Champagne M.D.   On: 11/23/2012 09:33   Dg Femur Right  11/23/2012   CLINICAL DATA:  Hip pain.  EXAM: RIGHT FEMUR - 2 VIEW  COMPARISON:  10/22/2012.  FINDINGS: The right femoral prosthetic head is dislocated.  No femur fracture.  IMPRESSION: Dislocation of the right femoral head prosthesis. No femur fracture.   Electronically Signed   By: Loralie Champagne M.D.   On:  11/23/2012 09:32    EKG Interpretation     Ventricular Rate:  78 PR Interval:    QRS Duration: 108 QT Interval:  396 QTC Calculation: 451 R Axis:   -14 Text Interpretation:  Atrial fibrillation Anterior infarct, old Artifact No significant change since last tracing           Patient with right hip posterior dislocation by x-ray. This will be the third time since September that this has happened. Discuss with orthopedics Dr. Turner Daniels recommended as trying to put it back in the goes in he can be discharged home with his brace. However patient did have his brace on when he came out this time.  Consent obtained for conscious sedation with ketamine and propofol and for right hip relocation.  Conscious sedation done with a mean and propofol. Patient received 30 mg of ketamine and 30 mg of propofol IV prior receiving that time out was performed. Patient was on nasal cannula oxygen cardiac monitor 100% nonrebreather oxygen Ambu bag was available as well as suctioning. The patient tolerated the consultation fine no drop in sats. Patient returned to his baseline mental status.  For the hip reduction. After the conscious sedation the hip was reduced without any difficulties patient's leg brace was replaced. Prior to hip reduction dorsalis pedis pulse on the right side was 2+ Refill was 2 seconds. There was the same after the reduction.    MDM   1. Closed dislocation of right hip, initial encounter   2. Toe laceration, initial encounter    As stated above. Patient underwent conscious sedation and hip reduction for dislocated the right hip. Patient tolerated the conscious sedation fine in the procedure fine. X-ray showed that the hip was reduced and during the process of reducing the hip a clear pop was identified. Patient's dorsalis pedis pulse and Refill to his right foot remained constant before and after the procedure with good pulses and good cap refill.  The laceration to the right second  toe will heal on its on. Recommend soaking it daily for 20 minutes in warm water and applying bacitracin or Neosporin ointment. The wound is not very amenable based dislocation to suturing should heal well on  its own. When the toe is in a central position of the wound is closed.     Shelda Jakes, MD 11/23/12 [phone]

## 2012-11-23 NOTE — ED Notes (Signed)
Patient is resting comfortably. 

## 2012-11-23 NOTE — ED Notes (Signed)
Patient denies pain and is resting comfortably.  

## 2012-11-23 NOTE — ED Notes (Signed)
Per EMS, patient was weighing himself in his bathroom this morning and slipped and fell.   Patient states that he didn't really hit anything, but "feels like I pulled the hip out".  Patient states he had R hip dislocation 1 month ago and was already in brace.   He claims it feels the same at this time.   There is shortening and inward rotation of the R leg.   Patient also had a small laceration below his R middle toe.

## 2012-11-23 NOTE — ED Notes (Signed)
[facility] called to transport pt back home 

## 2012-11-25 ENCOUNTER — Inpatient Hospital Stay (HOSPITAL_COMMUNITY)
Admission: EM | Admit: 2012-11-25 | Discharge: 2012-12-02 | DRG: 467 | Disposition: A | Discharge status: Skilled Nursing Facility | Payer: Medicare Other | Attending: Orthopedic Surgery | Admitting: Orthopedic Surgery

## 2012-11-25 ENCOUNTER — Encounter (HOSPITAL_COMMUNITY): Payer: Self-pay | Admitting: Emergency Medicine

## 2012-11-25 ENCOUNTER — Inpatient Hospital Stay (HOSPITAL_COMMUNITY): Payer: Medicare Other

## 2012-11-25 ENCOUNTER — Encounter: Payer: Self-pay | Admitting: Infectious Disease

## 2012-11-25 ENCOUNTER — Emergency Department (HOSPITAL_COMMUNITY): Payer: Medicare Other

## 2012-11-25 ENCOUNTER — Ambulatory Visit (INDEPENDENT_AMBULATORY_CARE_PROVIDER_SITE_OTHER): Payer: Medicare Other | Admitting: Infectious Disease

## 2012-11-25 VITALS — BP 128/72 | HR 105 | Temp 98.3°F | Wt 215.0 lb

## 2012-11-25 DIAGNOSIS — E119 Type 2 diabetes mellitus without complications: Secondary | ICD-10-CM | POA: Diagnosis present

## 2012-11-25 DIAGNOSIS — S73004D Unspecified dislocation of right hip, subsequent encounter: Secondary | ICD-10-CM

## 2012-11-25 DIAGNOSIS — F411 Generalized anxiety disorder: Secondary | ICD-10-CM | POA: Diagnosis present

## 2012-11-25 DIAGNOSIS — Z950 Presence of cardiac pacemaker: Secondary | ICD-10-CM

## 2012-11-25 DIAGNOSIS — F3289 Other specified depressive episodes: Secondary | ICD-10-CM | POA: Diagnosis present

## 2012-11-25 DIAGNOSIS — Z5189 Encounter for other specified aftercare: Secondary | ICD-10-CM

## 2012-11-25 DIAGNOSIS — Y831 Surgical operation with implant of artificial internal device as the cause of abnormal reaction of the patient, or of later complication, without mention of misadventure at the time of the procedure: Secondary | ICD-10-CM | POA: Diagnosis present

## 2012-11-25 DIAGNOSIS — T827XXD Infection and inflammatory reaction due to other cardiac and vascular devices, implants and grafts, subsequent encounter: Secondary | ICD-10-CM

## 2012-11-25 DIAGNOSIS — K6819 Other retroperitoneal abscess: Secondary | ICD-10-CM

## 2012-11-25 DIAGNOSIS — T8451XD Infection and inflammatory reaction due to internal right hip prosthesis, subsequent encounter: Secondary | ICD-10-CM

## 2012-11-25 DIAGNOSIS — Z794 Long term (current) use of insulin: Secondary | ICD-10-CM

## 2012-11-25 DIAGNOSIS — S73004S Unspecified dislocation of right hip, sequela: Secondary | ICD-10-CM

## 2012-11-25 DIAGNOSIS — T71191A Asphyxiation due to mechanical threat to breathing due to other causes, accidental, initial encounter: Secondary | ICD-10-CM

## 2012-11-25 DIAGNOSIS — I10 Essential (primary) hypertension: Secondary | ICD-10-CM | POA: Diagnosis present

## 2012-11-25 DIAGNOSIS — R7881 Bacteremia: Secondary | ICD-10-CM

## 2012-11-25 DIAGNOSIS — M869 Osteomyelitis, unspecified: Secondary | ICD-10-CM

## 2012-11-25 DIAGNOSIS — Z9181 History of falling: Secondary | ICD-10-CM

## 2012-11-25 DIAGNOSIS — S73004A Unspecified dislocation of right hip, initial encounter: Secondary | ICD-10-CM

## 2012-11-25 DIAGNOSIS — Z96649 Presence of unspecified artificial hip joint: Secondary | ICD-10-CM

## 2012-11-25 DIAGNOSIS — M24459 Recurrent dislocation, unspecified hip: Secondary | ICD-10-CM | POA: Diagnosis present

## 2012-11-25 DIAGNOSIS — T8489XA Other specified complication of internal orthopedic prosthetic devices, implants and grafts, initial encounter: Principal | ICD-10-CM | POA: Diagnosis present

## 2012-11-25 DIAGNOSIS — I4891 Unspecified atrial fibrillation: Secondary | ICD-10-CM | POA: Diagnosis present

## 2012-11-25 DIAGNOSIS — M129 Arthropathy, unspecified: Secondary | ICD-10-CM | POA: Diagnosis present

## 2012-11-25 DIAGNOSIS — A4901 Methicillin susceptible Staphylococcus aureus infection, unspecified site: Secondary | ICD-10-CM

## 2012-11-25 DIAGNOSIS — Z792 Long term (current) use of antibiotics: Secondary | ICD-10-CM

## 2012-11-25 DIAGNOSIS — F329 Major depressive disorder, single episode, unspecified: Secondary | ICD-10-CM | POA: Diagnosis present

## 2012-11-25 DIAGNOSIS — D62 Acute posthemorrhagic anemia: Secondary | ICD-10-CM | POA: Diagnosis not present

## 2012-11-25 LAB — POCT I-STAT, CHEM 8
BUN: 11 mg/dL (ref 6–23)
Creatinine, Ser: 1.2 mg/dL (ref 0.50–1.35)
Glucose, Bld: 187 mg/dL — ABNORMAL HIGH (ref 70–99)
Hemoglobin: 10.9 g/dL — ABNORMAL LOW (ref 13.0–17.0)
Potassium: 3.5 mEq/L (ref 3.5–5.1)
TCO2: 26 mmol/L (ref 0–100)

## 2012-11-25 LAB — CBC WITH DIFFERENTIAL/PLATELET
Eosinophils Absolute: 0.1 10*3/uL (ref 0.0–0.7)
Eosinophils Relative: 1 % (ref 0–5)
Hemoglobin: 9.9 g/dL — ABNORMAL LOW (ref 13.0–17.0)
Lymphs Abs: 0.8 10*3/uL (ref 0.7–4.0)
MCH: 31.2 pg (ref 26.0–34.0)
MCV: 96.8 fL (ref 78.0–100.0)
Monocytes Relative: 13 % — ABNORMAL HIGH (ref 3–12)
Neutrophils Relative %: 74 % (ref 43–77)
Platelets: 179 10*3/uL (ref 150–400)
RBC: 3.17 MIL/uL — ABNORMAL LOW (ref 4.22–5.81)
WBC: 7.3 10*3/uL (ref 4.0–10.5)

## 2012-11-25 LAB — GLUCOSE, CAPILLARY: Glucose-Capillary: 150 mg/dL — ABNORMAL HIGH (ref 70–99)

## 2012-11-25 MED ORDER — ACETAMINOPHEN 650 MG RE SUPP: 650.0000 mg | Freq: Four times a day (QID) | RECTAL | Status: DC | PRN

## 2012-11-25 MED ORDER — FINASTERIDE 5 MG PO TABS
5.0000 mg | ORAL_TABLET | Freq: Every day | ORAL | Status: DC
  Administered 2012-11-26 – 2012-12-02 (×7): 5 mg via ORAL
  Filled 2012-11-25 (×7): qty 1

## 2012-11-25 MED ORDER — INSULIN ASPART 100 UNIT/ML ~~LOC~~ SOLN: 2.0000 [IU] | Freq: Three times a day (TID) | SUBCUTANEOUS | Status: DC

## 2012-11-25 MED ORDER — ESCITALOPRAM OXALATE 10 MG PO TABS
10.0000 mg | ORAL_TABLET | Freq: Every day | ORAL | Status: DC
  Administered 2012-11-26 – 2012-12-02 (×7): 10 mg via ORAL
  Filled 2012-11-25 (×7): qty 1

## 2012-11-25 MED ORDER — POTASSIUM CHLORIDE CRYS ER 20 MEQ PO TBCR
20.0000 meq | EXTENDED_RELEASE_TABLET | Freq: Every day | ORAL | Status: DC
  Administered 2012-11-26 – 2012-12-02 (×7): 20 meq via ORAL
  Filled 2012-11-25 (×7): qty 1

## 2012-11-25 MED ORDER — PIOGLITAZONE HCL 15 MG PO TABS
15.0000 mg | ORAL_TABLET | Freq: Every day | ORAL | Status: DC
  Administered 2012-11-26 – 2012-12-02 (×6): 15 mg via ORAL
  Filled 2012-11-25 (×8): qty 1

## 2012-11-25 MED ORDER — FERROUS SULFATE 325 (65 FE) MG PO TABS
325.0000 mg | ORAL_TABLET | Freq: Every day | ORAL | Status: DC
  Administered 2012-11-26 – 2012-12-02 (×6): 325 mg via ORAL
  Filled 2012-11-25 (×8): qty 1

## 2012-11-25 MED ORDER — MORPHINE SULFATE 4 MG/ML IJ SOLN
4.0000 mg | Freq: Once | INTRAMUSCULAR | Status: AC
  Administered 2012-11-25: 4 mg via INTRAVENOUS
  Filled 2012-11-25: qty 1

## 2012-11-25 MED ORDER — LORAZEPAM 1 MG PO TABS
1.0000 mg | ORAL_TABLET | Freq: Two times a day (BID) | ORAL | Status: DC | PRN
  Administered 2012-11-26 – 2012-12-01 (×3): 1 mg via ORAL
  Filled 2012-11-25 (×4): qty 1

## 2012-11-25 MED ORDER — MORPHINE SULFATE 4 MG/ML IJ SOLN: 4.0000 mg | Freq: Once | INTRAMUSCULAR | Status: DC

## 2012-11-25 MED ORDER — METOPROLOL SUCCINATE 12.5 MG HALF TABLET
12.5000 mg | ORAL_TABLET | Freq: Every day | ORAL | Status: DC
  Administered 2012-11-26 – 2012-12-02 (×7): 12.5 mg via ORAL
  Filled 2012-11-25 (×7): qty 1

## 2012-11-25 MED ORDER — PROPOFOL 10 MG/ML IV BOLUS: 50.0000 mg | Freq: Once | INTRAVENOUS | Status: DC

## 2012-11-25 MED ORDER — PROPOFOL 10 MG/ML IV BOLUS: 0.5000 mg/kg | Freq: Once | INTRAVENOUS | Status: DC

## 2012-11-25 MED ORDER — CEPHALEXIN 500 MG PO CAPS
500.0000 mg | ORAL_CAPSULE | Freq: Four times a day (QID) | ORAL | Status: DC
  Administered 2012-11-25 – 2012-12-02 (×23): 500 mg via ORAL
  Filled 2012-11-25 (×29): qty 1

## 2012-11-25 MED ORDER — SIMVASTATIN 5 MG PO TABS
5.0000 mg | ORAL_TABLET | Freq: Every day | ORAL | Status: DC
  Administered 2012-11-26 – 2012-11-30 (×3): 5 mg via ORAL
  Filled 2012-11-25 (×7): qty 1

## 2012-11-25 MED ORDER — TERAZOSIN HCL 5 MG PO CAPS
5.0000 mg | ORAL_CAPSULE | Freq: Every day | ORAL | Status: DC
  Administered 2012-11-25 – 2012-12-01 (×7): 5 mg via ORAL
  Filled 2012-11-25 (×8): qty 1

## 2012-11-25 MED ORDER — RIFAMPIN 300 MG PO CAPS
300.0000 mg | ORAL_CAPSULE | Freq: Two times a day (BID) | ORAL | Status: DC
  Administered 2012-11-25 – 2012-12-02 (×14): 300 mg via ORAL
  Filled 2012-11-25 (×16): qty 1

## 2012-11-25 MED ORDER — FUROSEMIDE 40 MG PO TABS
40.0000 mg | ORAL_TABLET | Freq: Every day | ORAL | Status: DC
  Administered 2012-11-26 – 2012-12-02 (×7): 40 mg via ORAL
  Filled 2012-11-25 (×7): qty 1

## 2012-11-25 MED ORDER — SODIUM CHLORIDE 0.9 % IV BOLUS (SEPSIS)
1000.0000 mL | Freq: Once | INTRAVENOUS | Status: AC
  Administered 2012-11-25: 1000 mL via INTRAVENOUS

## 2012-11-25 MED ORDER — ONDANSETRON HCL 4 MG/2ML IJ SOLN
4.0000 mg | Freq: Once | INTRAMUSCULAR | Status: AC
  Administered 2012-11-25: 4 mg via INTRAVENOUS
  Filled 2012-11-25: qty 2

## 2012-11-25 MED ORDER — DOCUSATE SODIUM 100 MG PO CAPS: 100.0000 mg | ORAL_CAPSULE | Freq: Two times a day (BID) | ORAL | Status: DC

## 2012-11-25 MED ORDER — ACETAMINOPHEN 325 MG PO TABS
650.0000 mg | ORAL_TABLET | Freq: Four times a day (QID) | ORAL | Status: DC | PRN
  Administered 2012-12-01: 650 mg via ORAL
  Filled 2012-11-25: qty 2

## 2012-11-25 MED ORDER — PROPOFOL 10 MG/ML IV BOLUS
200.0000 mg | Freq: Once | INTRAVENOUS | Status: AC
  Administered 2012-11-25: 50 mg via INTRAVENOUS
  Filled 2012-11-25: qty 20

## 2012-11-25 MED ORDER — POLYETHYLENE GLYCOL 3350 17 G PO PACK
17.0000 g | PACK | Freq: Every day | ORAL | Status: DC
  Administered 2012-11-26 – 2012-12-01 (×4): 17 g via ORAL
  Filled 2012-11-25 (×7): qty 1

## 2012-11-25 MED ORDER — ASPIRIN 325 MG PO TABS
325.0000 mg | ORAL_TABLET | Freq: Every day | ORAL | Status: DC
  Administered 2012-11-26 – 2012-12-02 (×7): 325 mg via ORAL
  Filled 2012-11-25 (×7): qty 1

## 2012-11-25 MED ORDER — ONDANSETRON HCL 4 MG/2ML IJ SOLN: 4.0000 mg | Freq: Four times a day (QID) | INTRAMUSCULAR | Status: DC | PRN

## 2012-11-25 MED ORDER — VITAMIN C 500 MG PO TABS
1000.0000 mg | ORAL_TABLET | Freq: Every day | ORAL | Status: DC
  Administered 2012-11-26 – 2012-12-02 (×7): 1000 mg via ORAL
  Filled 2012-11-25 (×8): qty 2

## 2012-11-25 MED ORDER — SENNOSIDES-DOCUSATE SODIUM 8.6-50 MG PO TABS: 1.0000 | ORAL_TABLET | Freq: Every evening | ORAL | Status: DC | PRN

## 2012-11-25 MED ORDER — FLEET ENEMA 7-19 GM/118ML RE ENEM: 1.0000 | ENEMA | Freq: Once | RECTAL | Status: AC | PRN

## 2012-11-25 MED ORDER — BISACODYL 5 MG PO TBEC
5.0000 mg | DELAYED_RELEASE_TABLET | Freq: Every day | ORAL | Status: DC | PRN
  Administered 2012-11-29 – 2012-11-30 (×2): 5 mg via ORAL
  Filled 2012-11-25 (×2): qty 1

## 2012-11-25 MED ORDER — HYDROCODONE-ACETAMINOPHEN 5-325 MG PO TABS
1.0000 | ORAL_TABLET | ORAL | Status: DC | PRN
  Administered 2012-11-25 – 2012-11-27 (×2): 1 via ORAL
  Administered 2012-11-28 – 2012-11-30 (×4): 2 via ORAL
  Filled 2012-11-25 (×6): qty 2

## 2012-11-25 MED ORDER — RIFAMPIN 300 MG PO CAPS: 300.0000 mg | ORAL_CAPSULE | Freq: Two times a day (BID) | ORAL | Status: DC

## 2012-11-25 MED ORDER — DILTIAZEM HCL ER COATED BEADS 120 MG PO CP24
120.0000 mg | ORAL_CAPSULE | Freq: Every day | ORAL | Status: DC
  Administered 2012-11-26 – 2012-12-02 (×7): 120 mg via ORAL
  Filled 2012-11-25 (×7): qty 1

## 2012-11-25 MED ORDER — DOCUSATE SODIUM 100 MG PO CAPS
100.0000 mg | ORAL_CAPSULE | Freq: Two times a day (BID) | ORAL | Status: DC
  Administered 2012-11-25 – 2012-12-02 (×14): 100 mg via ORAL
  Filled 2012-11-25 (×15): qty 1

## 2012-11-25 MED ORDER — LEVOTHYROXINE SODIUM 88 MCG PO TABS
88.0000 ug | ORAL_TABLET | Freq: Every day | ORAL | Status: DC
  Administered 2012-11-26 – 2012-12-02 (×7): 88 ug via ORAL
  Filled 2012-11-25 (×8): qty 1

## 2012-11-25 MED ORDER — CEPHALEXIN 500 MG PO CAPS: 500.0000 mg | ORAL_CAPSULE | Freq: Four times a day (QID) | ORAL | Status: DC

## 2012-11-25 NOTE — ED Notes (Signed)
Dr. Rolland Porter successful at reducing right hip.

## 2012-11-25 NOTE — Progress Notes (Signed)
[facility]  Patient Status: Active (receiving services up to time of hospitalization)  [facility] is providing the following services: RN, PT and OT  If patient discharges after hours, please call [phone].   Jodene Nam 11/25/2012, 7:58 PM

## 2012-11-25 NOTE — ED Notes (Signed)
Patient transported to X-ray 

## 2012-11-25 NOTE — ED Notes (Signed)
Pt reports that he was here on Wednesday with a dislocated hip. Reports that he had the hip reset and and went home. Pt reports he was on the way home from an appt and felt like the hip popped out of place. Pt reports intense pain with palpation and slight movement. Pt currently wearing a brace on that side.

## 2012-11-25 NOTE — H&P (Signed)
[name] is an 79 y.o. male.   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 HPI: Patient underwent hybrid right total up arthroplasty in 2012, did well until late July of 2014 when he developed back pain, that radiated down his right leg. He had back surgery in January 2014 and was admitted by Dr. Marikay Alar , and his workup showed collection of fluid possibly a retroperitoneal hematoma secondary to an INR of 7.2. This subsequently grew out MSSA and percutaneous drains were placed. Because the CT scan also showed fluid collection going down the psoas muscle to the hip region the patient ultimately underwent radical irrigation and debridement of his right total hip and was placed on IV antibiotics for 6 weeks and is now on by mouth antibiotics for 6 weeks. Unfortunately he is had 3 dislocations of his total hip after the irrigation and debridement which included removal of all the hip joint capsule. By x-ray the hip was geometrically stable, the patient is not clinically ill he is afebrile his CRP which was 15 in August was down to 4.5 on 10/20/2012, his sedimentation rate which was in the 1:30 range was down to 95 on 10/20/2012.he dislocated 2 days ago on Wednesday when he slipped and fell in the bathroom wearing his brace and his hip dislocated again today simply getting in and out of the car with the brace on. Because he is a geometrically stable hip and has dislocated twice now wearing a hinge abduction brace placement of a constrained liner has been discussed with the patient and his wife and that is their desire.he is admitted to the hospital today for observation and to allow [place] time to get the special constrained implants and get him on the surgery schedule. If he was sent back to the skilled nursing facility he would probably simply dislocate again.  Past Medical History  Diagnosis Date  . Atrial fibrillation   . Hypertension   . Pacemaker   . Diabetes mellitus  without complication     Borderline  . Arthritis   . Anxiety   . Depression   . MSSA (methicillin susceptible Staphylococcus aureus) infection   . Bacteremia   . Infected pacemaker   . Septic hip     Past Surgical History  Procedure Laterality Date  . Pacemaker insertion    . Lumbar laminectomy/decompression microdiscectomy  02/14/2012    Procedure: LUMBAR LAMINECTOMY/DECOMPRESSION MICRODISCECTOMY 1 LEVEL;  Surgeon: Tia Alert, MD;  Location: [facility];  Service: Neurosurgery;  Laterality: N/A;  Thoracic twelve - Lumbar one decompressive laminectomy.  Rhae Hammock without cardioversion N/A 08/30/2012    Procedure: TRANSESOPHAGEAL ECHOCARDIOGRAM (TEE);  Surgeon: Laurey Morale, MD;  Location: [facility] ENDOSCOPY;  Service: Cardiovascular;  Laterality: N/A;  . Icd lead removal Left 08/31/2012    Procedure: ICD LEAD REMOVAL;  Surgeon: Marinus Maw, MD;  Location: [facility] OR;  Service: Cardiovascular;  Laterality: Left;  . Incision and drainage hip Right 09/02/2012    Procedure: IRRIGATION AND DEBRIDEMENT RIGHT HIP;  Surgeon: Nestor Lewandowsky, MD;  Location: [facility] OR;  Service: Orthopedics;  Laterality: Right;  . Total hip revision Right 09/02/2012    Procedure: REVISION OF POLYETHYLENE LINER AND FEMORAL HEAD ;  Surgeon: Nestor Lewandowsky, MD;  Location: [facility] OR;  Service: Orthopedics;  Laterality: Right;  . Hip closed reduction Right 10/02/2012    Procedure: CLOSED REDUCTION HIP;  Surgeon: Velna Ochs, MD;  Location: [facility] OR;  Service: Orthopedics;  Laterality: Right;  History reviewed. No pertinent family history. Social History:  reports that he has never smoked. He has never used smokeless tobacco. He reports that he does not drink alcohol or use illicit drugs.  Allergies:  Allergies  Allergen Reactions  . Ambien [Zolpidem]     hallucinations    Medications Prior to Admission  Medication Sig Dispense Refill  . aspirin 325 MG tablet Take 1 tablet (325 mg total) by mouth daily.  90 tablet  0  . bisacodyl  (DULCOLAX) 5 MG EC tablet Take 5 mg by mouth daily as needed for constipation.      . cephALEXin (KEFLEX) 500 MG capsule Take 1 capsule (500 mg total) by mouth 4 (four) times daily.  120 capsule  11  . Coenzyme Q10 200 MG capsule Take 200 mg by mouth daily.      Marland Kitchen diltiazem (CARDIZEM CD) 120 MG 24 hr capsule Take 1 capsule (120 mg total) by mouth daily.  90 capsule  0  . docusate sodium 100 MG CAPS Take 100 mg by mouth 2 (two) times daily.  10 capsule  0  . escitalopram (LEXAPRO) 10 MG tablet Take 10 mg by mouth daily.       . ferrous sulfate 325 (65 FE) MG tablet Take 325 mg by mouth daily with breakfast.      . finasteride (PROSCAR) 5 MG tablet Take 5 mg by mouth daily.        . furosemide (LASIX) 40 MG tablet Take 40 mg by mouth daily.        Marland Kitchen HYDROcodone-acetaminophen (NORCO/VICODIN) 5-325 MG per tablet Take 1-2 tablets by mouth every 6 (six) hours as needed for pain.  20 tablet  0  . insulin aspart (NOVOLOG) 100 UNIT/ML injection Inject 2-12 Units into the skin 3 (three) times daily with meals. Sliding Scale Insulin 151-200=2 units; 201-250=4 units; 251-300=6 units; 301-350=8 units; 351-400=10 units; >400=12 units and call doctor      . levothyroxine (SYNTHROID, LEVOTHROID) 88 MCG tablet Take 88 mcg by mouth daily before breakfast.      . LORazepam (ATIVAN) 1 MG tablet Take 1 mg by mouth 2 (two) times daily as needed for anxiety.       . lovastatin (MEVACOR) 20 MG tablet Take 40 mg by mouth at bedtime.       . Melatonin 3 MG TABS Take 3 mg by mouth at bedtime.      . metoprolol succinate (TOPROL-XL) 25 MG 24 hr tablet Take 12.5 mg by mouth daily.      . pioglitazone (ACTOS) 15 MG tablet Take 15 mg by mouth daily.       . polyethylene glycol (MIRALAX / GLYCOLAX) packet Take 17 g by mouth daily.      . potassium chloride SA (K-DUR,KLOR-CON) 20 MEQ tablet Take 20 mEq by mouth daily.      . rifampin (RIFADIN) 300 MG capsule Take 1 capsule (300 mg total) by mouth 2 (two) times daily.  60 capsule   11  . terazosin (HYTRIN) 5 MG capsule Take 5 mg by mouth at bedtime.       . vitamin C (ASCORBIC ACID) 500 MG tablet Take 1,000 mg by mouth daily.        Results for orders placed during the hospital encounter of 11/25/12 (from the past 48 hour(s))  CBC WITH DIFFERENTIAL     Status: Abnormal   Collection Time    11/25/12  2:09 PM      Result Value  Range   WBC 7.3  4.0 - 10.5 K/uL   RBC 3.17 (*) 4.22 - 5.81 MIL/uL   Hemoglobin 9.9 (*) 13.0 - 17.0 g/dL   HCT 16.1 (*) 09.6 - 04.5 %   MCV 96.8  78.0 - 100.0 fL   MCH 31.2  26.0 - 34.0 pg   MCHC 32.2  30.0 - 36.0 g/dL   RDW 40.9  81.1 - 91.4 %   Platelets 179  150 - 400 K/uL   Neutrophils Relative % 74  43 - 77 %   Neutro Abs 5.4  1.7 - 7.7 K/uL   Lymphocytes Relative 11 (*) 12 - 46 %   Lymphs Abs 0.8  0.7 - 4.0 K/uL   Monocytes Relative 13 (*) 3 - 12 %   Monocytes Absolute 0.9  0.1 - 1.0 K/uL   Eosinophils Relative 1  0 - 5 %   Eosinophils Absolute 0.1  0.0 - 0.7 K/uL   Basophils Relative 0  0 - 1 %   Basophils Absolute 0.0  0.0 - 0.1 K/uL  POCT I-STAT, CHEM 8     Status: Abnormal   Collection Time    11/25/12  2:30 PM      Result Value Range   Sodium 142  135 - 145 mEq/L   Potassium 3.5  3.5 - 5.1 mEq/L   Chloride 101  96 - 112 mEq/L   BUN 11  6 - 23 mg/dL   Creatinine, Ser 7.82  0.50 - 1.35 mg/dL   Glucose, Bld 956 (*) 70 - 99 mg/dL   Calcium, Ion 2.13  0.86 - 1.30 mmol/L   TCO2 26  0 - 100 mmol/L   Hemoglobin 10.9 (*) 13.0 - 17.0 g/dL   HCT 57.8 (*) 46.9 - 62.9 %   Dg Hip Complete Right  11/25/2012   CLINICAL DATA:  Status post right hip reduction  EXAM: RIGHT HIP - COMPLETE 2+ VIEW  COMPARISON:  Film from earlier in the same day.  FINDINGS: There is been reduction of the patient's right hip prosthesis. No acute abnormality is noted. No dislocation or fracture is seen.   Electronically Signed   By: Alcide Clever M.D.   On: 11/25/2012 18:47   Dg Hip Complete Right  11/25/2012   CLINICAL DATA:  Recent fall with right hip  dislocation, now with recurrent right hip deformity  EXAM: RIGHT HIP - COMPLETE 2+ VIEW  COMPARISON:  11/23/2012  FINDINGS: Right total hip arthroplasty.  Recurrent posterior dislocation of the right femoral component.  No fracture is seen. Visualized bony pelvis appears intact.  Degenerative changes of the left hip and lower lumbar spine.  IMPRESSION: Right total hip arthroplasty.  Recurrent posterior dislocation of the right femoral component.   Electronically Signed   By: Charline Bills M.D.   On: 11/25/2012 15:45    ROS Patient denies any chest pain shortness of breath episodes of fever nausea or vomiting.   Blood pressure 142/83, pulse 98, temperature 99.4 F (37.4 C), temperature source Oral, resp. rate 18, SpO2 98.00%. Physical Exam Well-nourished well-developed 79 year old man, wearing his hinge abduction brace appearing comfortable in his hospital bed. His respirations are normal his belly is soft examining his right hip the surgical wound is well-healed is no erythema or increased warmth he is neurovascularly intact to the right lower extremity. Normal pulses to the foot.  Assessment/Plan  Assessment: 3 dislocations a geometrically stable total hip since irrigation and debridement for a presumed seeding of his total  hip from retroperitoneal infected hematoma. The patient does not appear to be infected at this time has finished 6 weeks of IV antibiotics and is now on oral antibiotics.  Plan: To prevent further dislocations placement of a constrained liner has been discussed with the patient and his wife. With multiple dislocations despite bracing this is really the only option other than a resection arthroplasty which the patient does not want. Risks and benefits have been discussed and questions have been encouraged.   Zayaan Kozak J 11/25/2012, 8:32 PM

## 2012-11-25 NOTE — ED Notes (Signed)
Pt returned from xray

## 2012-11-25 NOTE — Progress Notes (Signed)
Subjective:    Patient ID: [name], male    DOB: Oct 08, 1930, 79 y.o.   MRN: [id number]  HPI   79 y.o. male with Pacemaker, Right prosthetic hip, Laminectomy in January of 2014 admitted in August 2014 with right leg pain weakness fevers. He has been found to have MSSA bacteremia and a large retroperitoneal abscess that is adjacent to his prosthetic hip.  Orthopedics performed resection of Radical irrigation and debridement right total hip with removal of all synovium, revision of femoral head, revision of polyethylene liner placement On 09/02/12  TEE failed to show vegetations on valves or overtly on PM. PM is out. He returned to the hospital with dislocation of his hip. This was able to be reduced without surgical intervention. My partner Dr. Ninetta Lights saw the patient and noted his sedimentation rate and C-reactive protein is still be quite high. Patient completed a course of  8 weeks of cefazolin 2 g IV every 8 hours at the skilled nursing facility along with  oral rifampin twice daily.   I changed him to oral Keflex when his IV antibiotics were stopped and he continued on high-dose Keflex with rifampin since then. Unfortunately he suffered a fall at home this past week when he attempted to get up onto a scale to weigh himself and slipped. His right hip was again dislocated. It was placed back into position in the emergency room and under conscious sedation. He feels better now and is that without much in the way of new complaints. He is tolerating his antibiotics without difficulty.  Review of Systems  Constitutional: Negative for fever, chills, diaphoresis, activity change, appetite change, fatigue and unexpected weight change.  HENT: Negative for sore throat and trouble swallowing.   Eyes: Negative for photophobia and visual disturbance.  Respiratory: Negative for cough and chest tightness.   Cardiovascular: Positive for palpitations.  Gastrointestinal: Negative for nausea, vomiting,  abdominal pain, diarrhea, constipation, blood in stool, abdominal distention and anal bleeding.  Genitourinary: Negative for dysuria, hematuria, flank pain and difficulty urinating.  Musculoskeletal: Positive for arthralgias and myalgias. Negative for back pain and joint swelling.  Skin: Negative for color change, pallor, rash and wound.  Neurological: Negative for dizziness, tremors, weakness and light-headedness.  Hematological: Negative for adenopathy. Does not bruise/bleed easily.  Psychiatric/Behavioral: Negative for behavioral problems, confusion, sleep disturbance, dysphoric mood, decreased concentration and agitation.       Objective:   Physical Exam  Constitutional: He is oriented to person, place, and time. He appears well-developed and well-nourished. No distress.  HENT:  Head: Normocephalic and atraumatic.  Mouth/Throat: Oropharynx is clear and moist. No oropharyngeal exudate.  Eyes: Conjunctivae and EOM are normal. No scleral icterus.  Neck: Normal range of motion. Neck supple.  Cardiovascular: Normal rate, regular rhythm and normal heart sounds.   Pulmonary/Chest: Effort normal and breath sounds normal. No respiratory distress. He has no wheezes. He has no rales. He exhibits no tenderness.  Abdominal: He exhibits no distension. There is tenderness. There is no rebound and no guarding.  Musculoskeletal: He exhibits edema. He exhibits no tenderness.  Neurological: He is alert and oriented to person, place, and time. He exhibits normal muscle tone.  Skin: Skin is warm and dry. He is not diaphoretic. No erythema. No pallor.     Psychiatric: He has a normal mood and affect. His behavior is normal. Judgment and thought content normal.          Assessment & Plan:  79 y.o. male with Pacemaker, Right  prosthetic hip, Laminectomy in January of 2014 admitted with right leg pain weakness fevers. He has been found to have MSSA bacteremia and a large retroperitoneal abscess that is  adjacent to his prosthetic hip.  Orthopedics performed resection of Radical irrigation and debridement right total hip with removal of all synovium, revision of femoral head, revision of polyethylene liner placement On 09/02/12  TEE failed to show vegetations on valves or overtly on PM. PM is removed:  He finished t 8 weeks post surgery with HIGH DOSE IV ancef and oral rifampin and now is on high dose keflex  Keflex 500 mg 4 times daily along with continued rifampin 300 mg twice daily.    I would like to continue these antibiotics for several months if not a half year or longer given the prosthetic material that may still be present in the hip   we'll check a sedimentation and C-reactive protein at his next visit given the fact that he is recent leaf on real dislocated his hip.  Hip dislocation: back in place yet again  Need for flu: has had flu shot

## 2012-11-25 NOTE — ED Provider Notes (Signed)
I discussed the case with Dr. Orvis Brill the patient's orthopedist. He is dislocated 4 times per Dr. Trish Fountain recommended patient be admitted for consideration for revision of this total hip arthroplasty with recurrent dislocations. I discussed this with the patient and family they were agreeable with the above plan.  Roney Marion, MD 11/25/12 [phone]

## 2012-11-25 NOTE — ED Notes (Signed)
Xray called, they stated transport was backed up, would send someone soon to pick up pt for xray.

## 2012-11-25 NOTE — ED Provider Notes (Signed)
CSN: [id number]     Arrival date & time 11/25/12  1350 History   First MD Initiated Contact with Patient 11/25/12 1359     Chief Complaint  Patient presents with  . Hip Pain   (Consider location/radiation/quality/duration/timing/severity/associated sxs/prior Treatment) Patient is a 79 y.o. male presenting with hip pain. The history is provided by the patient. No language interpreter was used.  Hip Pain This is a recurrent problem. The current episode started today. Pertinent negatives include no abdominal pain, chills, fever, joint swelling, numbness or weakness.   Pt is a 79 year old male who presents today with right hip pain. He has a history significant for MSSA bacteremia and a retroperitoneal abscess after a total hip replacement. On 8/8 he had an I&D with replacement of parts from his [facility]. Since then he has had subsequent dislocations of his right hip on 9/7, 9/26 and 10/29. He fell on the 29th and dislocated as a result of a fall. Today he has been out with his family going to a dr's appt and has transferred from wheelchair to car and doesn't know how he dislocated. No knowledge of injury or twisting motion. He reports that he has been wearing his brace and following instructions. He denies fever, chills, nausea, vomiting, abdominal or back pain.   Past Medical History  Diagnosis Date  . Atrial fibrillation   . Hypertension   . Pacemaker   . Diabetes mellitus without complication     Borderline  . Arthritis   . Anxiety   . Depression   . MSSA (methicillin susceptible Staphylococcus aureus) infection   . Bacteremia   . Infected pacemaker   . Septic hip    Past Surgical History  Procedure Laterality Date  . Pacemaker insertion    . Lumbar laminectomy/decompression microdiscectomy  02/14/2012    Procedure: LUMBAR LAMINECTOMY/DECOMPRESSION MICRODISCECTOMY 1 LEVEL;  Surgeon: Tia Alert, MD;  Location: [facility]UR[facility] ORS;  Service: Neurosurgery;  Laterality: N/A;  Thoracic twelve -  Lumbar one decompressive laminectomy.  Rhae Hammock without cardioversion N/A 08/30/2012    Procedure: TRANSESOPHAGEAL ECHOCARDIOGRAM (TEE);  Surgeon: Laurey Morale, MD;  Location: [facility] ENDOSCOPY;  Service: Cardiovascular;  Laterality: N/A;  . Icd lead removal Left 08/31/2012    Procedure: ICD LEAD REMOVAL;  Surgeon: Marinus Maw, MD;  Location: [facility] OR;  Service: Cardiovascular;  Laterality: Left;  . Incision and drainage hip Right 09/02/2012    Procedure: IRRIGATION AND DEBRIDEMENT RIGHT HIP;  Surgeon: Nestor Lewandowsky, MD;  Location: [facility] OR;  Service: Orthopedics;  Laterality: Right;  . Total hip revision Right 09/02/2012    Procedure: REVISION OF POLYETHYLENE LINER AND FEMORAL HEAD ;  Surgeon: Nestor Lewandowsky, MD;  Location: [facility] OR;  Service: Orthopedics;  Laterality: Right;  . Hip closed reduction Right 10/02/2012    Procedure: CLOSED REDUCTION HIP;  Surgeon: Velna Ochs, MD;  Location: [facility] OR;  Service: Orthopedics;  Laterality: Right;   History reviewed. No pertinent family history. History  Substance Use Topics  . Smoking status: Never Smoker   . Smokeless tobacco: Never Used  . Alcohol Use: No    Review of Systems  Constitutional: Negative for fever and chills.  Cardiovascular: Negative for leg swelling.  Gastrointestinal: Negative for abdominal pain.  Musculoskeletal: Negative for back pain and joint swelling.  Neurological: Negative for weakness and numbness.  All other systems reviewed and are negative.    Allergies  Ambien  Home Medications   Current Outpatient Rx  Name  Route  Sig  Dispense  Refill  . aspirin 325 MG tablet   Oral   Take 1 tablet (325 mg total) by mouth daily.   90 tablet   0   . bisacodyl (DULCOLAX) 5 MG EC tablet   Oral   Take 5 mg by mouth daily as needed for constipation.         . cephALEXin (KEFLEX) 500 MG capsule   Oral   Take 1 capsule (500 mg total) by mouth 4 (four) times daily.   120 capsule   11   . Coenzyme Q10 200 MG capsule   Oral    Take 200 mg by mouth daily.         Marland Kitchen diltiazem (CARDIZEM CD) 120 MG 24 hr capsule   Oral   Take 1 capsule (120 mg total) by mouth daily.   90 capsule   0   . docusate sodium 100 MG CAPS   Oral   Take 100 mg by mouth 2 (two) times daily.   10 capsule   0   . escitalopram (LEXAPRO) 10 MG tablet               . ferrous sulfate 325 (65 FE) MG tablet   Oral   Take 325 mg by mouth daily with breakfast.         . finasteride (PROSCAR) 5 MG tablet   Oral   Take 5 mg by mouth daily.           . furosemide (LASIX) 40 MG tablet   Oral   Take 40 mg by mouth daily.           Marland Kitchen HYDROcodone-acetaminophen (NORCO/VICODIN) 5-325 MG per tablet   Oral   Take 1-2 tablets by mouth every 6 (six) hours as needed for pain.   20 tablet   0   . insulin aspart (NOVOLOG) 100 UNIT/ML injection   Subcutaneous   Inject 2-12 Units into the skin 3 (three) times daily with meals. Sliding Scale Insulin 151-200=2 units; 201-250=4 units; 251-300=6 units; 301-350=8 units; 351-400=10 units; >400=12 units and call doctor         . levothyroxine (SYNTHROID, LEVOTHROID) 88 MCG tablet   Oral   Take 88 mcg by mouth daily before breakfast.         . LORazepam (ATIVAN) 1 MG tablet               . lovastatin (MEVACOR) 20 MG tablet   Oral   Take 40 mg by mouth at bedtime.           . Melatonin 3 MG TABS   Oral   Take 3 mg by mouth at bedtime.         . metoprolol succinate (TOPROL-XL) 25 MG 24 hr tablet   Oral   Take 12.5 mg by mouth daily.         . pioglitazone (ACTOS) 15 MG tablet   Oral   Take 1 tablet by mouth daily.         . polyethylene glycol (MIRALAX / GLYCOLAX) packet   Oral   Take 17 g by mouth daily.         . potassium chloride SA (K-DUR,KLOR-CON) 20 MEQ tablet   Oral   Take 20 mEq by mouth daily.         . rifampin (RIFADIN) 300 MG capsule   Oral   Take 1 capsule (300 mg total) by mouth 2 (two)  times daily.   60 capsule   11   . terazosin  (HYTRIN) 5 MG capsule               . vitamin C (ASCORBIC ACID) 500 MG tablet   Oral   Take 1,000 mg by mouth daily.          BP 133/71  Pulse 65  Temp(Src) 98.2 F (36.8 C) (Oral)  Resp 18  SpO2 100% Physical Exam  Nursing note and vitals reviewed. Constitutional: He is oriented to person, place, and time. He appears well-developed and well-nourished. No distress.  HENT:  Head: Normocephalic and atraumatic.  Mouth/Throat: Oropharynx is clear and moist.  Eyes: Conjunctivae and EOM are normal. Pupils are equal, round, and reactive to light.  Neck: Normal range of motion. Neck supple. No JVD present. No tracheal deviation present. No thyromegaly present.  Cardiovascular: Normal rate, regular rhythm, normal heart sounds and intact distal pulses.   Pulmonary/Chest: Effort normal and breath sounds normal.  Abdominal: Soft. Bowel sounds are normal.  Musculoskeletal: Normal range of motion.  Lymphadenopathy:    He has no cervical adenopathy.  Neurological: He is alert and oriented to person, place, and time. He has normal reflexes.  Skin: Skin is warm and dry.  Psychiatric: He has a normal mood and affect. His behavior is normal. Judgment and thought content normal.    ED Course  Reduction of dislocation Date/Time: 11/25/2012 4:31 PM Performed by: Irish Elders Authorized by: Irish Elders Consent: Verbal consent obtained. written consent obtained. Risks and benefits: risks, benefits and alternatives were discussed Consent given by: patient Patient understanding: patient states understanding of the procedure being performed Patient consent: the patient's understanding of the procedure matches consent given Procedure consent: procedure consent matches procedure scheduled Site marked: the operative site was marked Imaging studies: imaging studies available Required items: required blood products, implants, devices, and special equipment available Patient identity confirmed:  verbally with patient and arm band Time out: Immediately prior to procedure a "time out" was called to verify the correct patient, procedure, equipment, support staff and site/side marked as required. Preparation: Patient was prepped and draped in the usual sterile fashion. Local anesthesia used: no Patient sedated: yes Sedatives: propofol and see MAR for details Vitals: Vital signs were monitored during sedation. Patient tolerance: Patient tolerated the procedure well with no immediate complications. Comments: Closed reduction right hip. Performed by Dr. Rolland Porter. Right leg shortening and internal rotation resolved after reduction.   (including critical care time) Labs Review Labs Reviewed - No data to display Imaging Review No results found.  EKG Interpretation   None       MDM   1. Hip dislocation, right, subsequent encounter      Recurrent hip dislocations. MSSA bacteremia with retroperitoneal abscess adjacent to total hip replacement (08/25/2012). Surgical I&D with replacement parts this past August and has had 4 hip dislocations since. Right hip reduced here in ER with conscious sedation. Ortho Surgery called and will admit pt for further evaluation and treatment.     Irish Elders, NP 11/25/12 [id number]

## 2012-11-25 NOTE — ED Notes (Signed)
Pt still in x-ray

## 2012-11-25 NOTE — ED Notes (Signed)
[facility] number [phone], call for updates

## 2012-11-25 NOTE — ED Provider Notes (Signed)
Patient with sedation with propofol. Indication is procedural sedation for hip relocation. Patient tolerated this without difficulty. Sedation was 15 minutes. He recovered without difficulty. Post reduction films are pending.  Roney Marion, MD 11/25/12 1739

## 2012-11-26 LAB — HEMOGLOBIN A1C: Hgb A1c MFr Bld: 5.8 % — ABNORMAL HIGH (ref <5.7)

## 2012-11-26 LAB — GLUCOSE, CAPILLARY
Glucose-Capillary: 87 mg/dL (ref 70–99)
Glucose-Capillary: 113 mg/dL — ABNORMAL HIGH (ref 70–99)
Glucose-Capillary: 100 mg/dL — ABNORMAL HIGH (ref 70–99)

## 2012-11-26 LAB — CBC
HCT: 26.9 % — ABNORMAL LOW (ref 39.0–52.0)
Hemoglobin: 8.6 g/dL — ABNORMAL LOW (ref 13.0–17.0)
MCH: 31 pg (ref 26.0–34.0)
MCHC: 32 g/dL (ref 30.0–36.0)
MCV: 97.1 fL (ref 78.0–100.0)
RBC: 2.77 MIL/uL — ABNORMAL LOW (ref 4.22–5.81)
WBC: 6.7 10*3/uL (ref 4.0–10.5)

## 2012-11-26 LAB — PROTIME-INR
INR: 1.29 (ref 0.00–1.49)
Prothrombin Time: 15.8 seconds — ABNORMAL HIGH (ref 11.6–15.2)

## 2012-11-26 MED ORDER — INSULIN ASPART 100 UNIT/ML ~~LOC~~ SOLN
0.0000 [IU] | Freq: Three times a day (TID) | SUBCUTANEOUS | Status: DC
  Administered 2012-11-29: 3 [IU] via SUBCUTANEOUS
  Administered 2012-11-29: 2 [IU] via SUBCUTANEOUS
  Administered 2012-11-29: 3 [IU] via SUBCUTANEOUS
  Administered 2012-11-30: 2 [IU] via SUBCUTANEOUS
  Administered 2012-11-30 – 2012-12-02 (×4): 3 [IU] via SUBCUTANEOUS
  Administered 2012-12-02: 2 [IU] via SUBCUTANEOUS

## 2012-11-26 NOTE — ED Provider Notes (Signed)
Medical screening examination/treatment/procedure(s) were performed by non-physician practitioner and as supervising physician I was immediately available for consultation/collaboration.  EKG Interpretation     Ventricular Rate:    PR Interval:    QRS Duration:   QT Interval:    QTC Calculation:   R Axis:     Text Interpretation:                Roney Marion, MD 11/26/12 1119

## 2012-11-26 NOTE — Progress Notes (Signed)
Patient ID: [name], male   DOB: July 29, 1930, 79 y.o.   MRN: [id number] Subjective: Patient is resting comfortably he has had no further subluxation of his right total hip which dislocated twice in the last few days.  Objective: Patient's sedimentation rate which was 130 in August, 95 and September, is now down to 74. His CRP is pending. His hemoglobin is 8.6, he remains afebrile. Right hip wound is healed no pain to actually loading of the hip and in the supine position you can log roll him without any pain or subluxation.  Assessment: Geometrically stable right total hip arthroplasty that has dislocated 3 times, twice in a brace since a radical irrigation and debridement for a present infection that seeded from by retroperitoneal hematoma in August of 2014.  Plan: The patient is scheduled for placement of a constrained liner early in the afternoon on Monday, November 3 risk benefits were again discussed with the patient.

## 2012-11-27 LAB — GLUCOSE, CAPILLARY
Glucose-Capillary: 104 mg/dL — ABNORMAL HIGH (ref 70–99)
Glucose-Capillary: 111 mg/dL — ABNORMAL HIGH (ref 70–99)

## 2012-11-27 NOTE — Progress Notes (Signed)
Utilization Review Completed.[name] T11/02/2012  

## 2012-11-27 NOTE — Progress Notes (Signed)
Subjective: Patient is up eating breakfast as I enter the room. He has had no further subluxation of his right total hip which dislocated twice in the last few days.   Objective: Patient's sedimentation rate which was 130 in August, 95 and September, is now down to 74. His CRP is pending. His hemoglobin is 8.6, he remains afebrile.  HGb 8.6, WBC at 6.7.  Right hip wound is healed no pain to actually loading of the hip and in the supine position you can log roll him without any pain or subluxation.   Assessment: Geometrically stable right total hip arthroplasty that has dislocated 3 times, twice in a brace since a radical irrigation and debridement for a present infection that seeded from by retroperitoneal hematoma in August of 2014.   Plan: The patient is scheduled for placement of a constrained liner early in the afternoon on Monday, November 3 risk benefits were again discussed with the patient. NPO after midnight with anticipation of sx tomorrow

## 2012-11-28 ENCOUNTER — Encounter (HOSPITAL_COMMUNITY)
Admission: EM | Disposition: A | Discharge status: Skilled Nursing Facility | Payer: Self-pay | Source: Home / Self Care | Attending: Orthopedic Surgery

## 2012-11-28 ENCOUNTER — Inpatient Hospital Stay (HOSPITAL_COMMUNITY): Payer: Medicare Other | Admitting: Anesthesiology

## 2012-11-28 ENCOUNTER — Encounter (HOSPITAL_COMMUNITY): Payer: Medicare Other | Admitting: Anesthesiology

## 2012-11-28 ENCOUNTER — Inpatient Hospital Stay (HOSPITAL_COMMUNITY): Payer: Medicare Other

## 2012-11-28 HISTORY — PX: TOTAL HIP REVISION: SHX763

## 2012-11-28 LAB — GRAM STAIN

## 2012-11-28 LAB — GLUCOSE, CAPILLARY
Glucose-Capillary: 96 mg/dL (ref 70–99)
Glucose-Capillary: 78 mg/dL (ref 70–99)
Glucose-Capillary: 118 mg/dL — ABNORMAL HIGH (ref 70–99)

## 2012-11-28 LAB — SURGICAL PCR SCREEN
MRSA, PCR: NEGATIVE
Staphylococcus aureus: NEGATIVE

## 2012-11-28 LAB — HEMOGLOBIN AND HEMATOCRIT, BLOOD: HCT: 28.4 % — ABNORMAL LOW (ref 39.0–52.0)

## 2012-11-28 LAB — PREPARE RBC (CROSSMATCH)

## 2012-11-28 SURGERY — TOTAL HIP REVISION
Anesthesia: General | Site: Hip | Laterality: Right | Wound class: Clean

## 2012-11-28 MED ORDER — ONDANSETRON HCL 4 MG/2ML IJ SOLN: 4.0000 mg | Freq: Once | INTRAMUSCULAR | Status: DC | PRN

## 2012-11-28 MED ORDER — DIPHENHYDRAMINE HCL 12.5 MG/5ML PO ELIX: 12.5000 mg | ORAL_SOLUTION | ORAL | Status: DC | PRN

## 2012-11-28 MED ORDER — PROPOFOL 10 MG/ML IV BOLUS
INTRAVENOUS | Status: DC | PRN
  Administered 2012-11-28: 30 mg via INTRAVENOUS

## 2012-11-28 MED ORDER — BUPIVACAINE HCL (PF) 0.25 % IJ SOLN
INTRAMUSCULAR | Status: DC | PRN
  Administered 2012-11-28: 20 mL

## 2012-11-28 MED ORDER — ETOMIDATE 2 MG/ML IV SOLN
INTRAVENOUS | Status: DC | PRN
  Administered 2012-11-28: 18 mg via INTRAVENOUS

## 2012-11-28 MED ORDER — MENTHOL 3 MG MT LOZG: 1.0000 | LOZENGE | OROMUCOSAL | Status: DC | PRN

## 2012-11-28 MED ORDER — METOCLOPRAMIDE HCL 10 MG PO TABS: 5.0000 mg | ORAL_TABLET | Freq: Three times a day (TID) | ORAL | Status: DC | PRN

## 2012-11-28 MED ORDER — FENTANYL CITRATE 0.05 MG/ML IJ SOLN
INTRAMUSCULAR | Status: AC
  Administered 2012-11-28: 50 ug via INTRAVENOUS
  Filled 2012-11-28: qty 2

## 2012-11-28 MED ORDER — ARTIFICIAL TEARS OP OINT
TOPICAL_OINTMENT | OPHTHALMIC | Status: DC | PRN
  Administered 2012-11-28: 1 via OPHTHALMIC

## 2012-11-28 MED ORDER — MAGNESIUM CITRATE PO SOLN
1.0000 | Freq: Once | ORAL | Status: AC | PRN
  Filled 2012-11-28: qty 296

## 2012-11-28 MED ORDER — METHOCARBAMOL 100 MG/ML IJ SOLN
500.0000 mg | Freq: Four times a day (QID) | INTRAVENOUS | Status: DC | PRN
  Filled 2012-11-28: qty 5

## 2012-11-28 MED ORDER — HYDROMORPHONE HCL PF 1 MG/ML IJ SOLN: 0.5000 mg | INTRAMUSCULAR | Status: DC | PRN

## 2012-11-28 MED ORDER — PHENOL 1.4 % MT LIQD: 1.0000 | OROMUCOSAL | Status: DC | PRN

## 2012-11-28 MED ORDER — CEFAZOLIN SODIUM-DEXTROSE 2-3 GM-% IV SOLR: 2.0000 g | Freq: Three times a day (TID) | INTRAVENOUS | Status: DC

## 2012-11-28 MED ORDER — ROCURONIUM BROMIDE 100 MG/10ML IV SOLN
INTRAVENOUS | Status: DC | PRN
  Administered 2012-11-28: 50 mg via INTRAVENOUS

## 2012-11-28 MED ORDER — SODIUM CHLORIDE 0.9 % IR SOLN
Status: DC | PRN
  Administered 2012-11-28: 1000 mL

## 2012-11-28 MED ORDER — METOCLOPRAMIDE HCL 5 MG/ML IJ SOLN: 5.0000 mg | Freq: Three times a day (TID) | INTRAMUSCULAR | Status: DC | PRN

## 2012-11-28 MED ORDER — GLYCOPYRROLATE 0.2 MG/ML IJ SOLN
INTRAMUSCULAR | Status: DC | PRN
  Administered 2012-11-28: 0.6 mg via INTRAVENOUS

## 2012-11-28 MED ORDER — FENTANYL CITRATE 0.05 MG/ML IJ SOLN
INTRAMUSCULAR | Status: DC | PRN
  Administered 2012-11-28: 75 ug via INTRAVENOUS
  Administered 2012-11-28: 25 ug via INTRAVENOUS
  Administered 2012-11-28: 50 ug via INTRAVENOUS

## 2012-11-28 MED ORDER — BUPIVACAINE-EPINEPHRINE PF 0.25-1:200000 % IJ SOLN
INTRAMUSCULAR | Status: AC
  Filled 2012-11-28: qty 30

## 2012-11-28 MED ORDER — TRANEXAMIC ACID 100 MG/ML IV SOLN
1000.0000 mg | INTRAVENOUS | Status: AC
  Administered 2012-11-28: 1000 mg via INTRAVENOUS
  Filled 2012-11-28: qty 10

## 2012-11-28 MED ORDER — NEOSTIGMINE METHYLSULFATE 1 MG/ML IJ SOLN
INTRAMUSCULAR | Status: DC | PRN
  Administered 2012-11-28: 4 mg via INTRAVENOUS

## 2012-11-28 MED ORDER — LACTATED RINGERS IV SOLN
INTRAVENOUS | Status: DC | PRN
  Administered 2012-11-28 (×3): via INTRAVENOUS

## 2012-11-28 MED ORDER — CEFAZOLIN SODIUM-DEXTROSE 2-3 GM-% IV SOLR
INTRAVENOUS | Status: AC
  Administered 2012-11-28: 2 g via INTRAVENOUS
  Filled 2012-11-28: qty 50

## 2012-11-28 MED ORDER — METHOCARBAMOL 500 MG PO TABS: 500.0000 mg | ORAL_TABLET | Freq: Four times a day (QID) | ORAL | Status: DC | PRN

## 2012-11-28 MED ORDER — KCL IN DEXTROSE-NACL 20-5-0.45 MEQ/L-%-% IV SOLN
INTRAVENOUS | Status: DC
  Administered 2012-11-28 – 2012-11-30 (×5): via INTRAVENOUS
  Filled 2012-11-28 (×15): qty 1000

## 2012-11-28 MED ORDER — ONDANSETRON HCL 4 MG/2ML IJ SOLN
INTRAMUSCULAR | Status: DC | PRN
  Administered 2012-11-28: 4 mg via INTRAVENOUS

## 2012-11-28 MED ORDER — FENTANYL CITRATE 0.05 MG/ML IJ SOLN
25.0000 ug | INTRAMUSCULAR | Status: DC | PRN
  Administered 2012-11-28 (×2): 50 ug via INTRAVENOUS

## 2012-11-28 MED ORDER — OXYCODONE HCL 5 MG PO TABS
5.0000 mg | ORAL_TABLET | ORAL | Status: DC | PRN
  Administered 2012-12-02: 5 mg via ORAL
  Filled 2012-11-28: qty 1

## 2012-11-28 SURGICAL SUPPLY — 66 items
BOWL SMART MIX CTS (DISPOSABLE) IMPLANT
BRUSH FEMORAL CANAL (MISCELLANEOUS) IMPLANT
CLOTH BEACON ORANGE TIMEOUT ST (SAFETY) ×2 IMPLANT
COVER SURGICAL LIGHT HANDLE (MISCELLANEOUS) ×2 IMPLANT
DRAPE C-ARM 42X72 X-RAY (DRAPES) IMPLANT
DRAPE ORTHO SPLIT 77X108 STRL (DRAPES) ×2
DRAPE PROXIMA HALF (DRAPES) ×2 IMPLANT
DRAPE SURG ORHT 6 SPLT 77X108 (DRAPES) ×1 IMPLANT
DRAPE U-SHAPE 47X51 STRL (DRAPES) ×2 IMPLANT
DRILL BIT 7/64X5 (BIT) ×2 IMPLANT
DRSG AQUACEL AG ADV 3.5X14 (GAUZE/BANDAGES/DRESSINGS) ×1 IMPLANT
DRSG MEPILEX BORDER 4X8 (GAUZE/BANDAGES/DRESSINGS) ×2 IMPLANT
DURAPREP 26ML APPLICATOR (WOUND CARE) ×2 IMPLANT
ELECT BLADE 4.0 EZ CLEAN MEGAD (MISCELLANEOUS)
ELECT BLADE 6.5 EXT (BLADE) IMPLANT
ELECT REM PT RETURN 9FT ADLT (ELECTROSURGICAL) ×2
ELECTRODE BLDE 4.0 EZ CLN MEGD (MISCELLANEOUS) IMPLANT
ELECTRODE REM PT RTRN 9FT ADLT (ELECTROSURGICAL) ×1 IMPLANT
EVACUATOR 1/8 PVC DRAIN (DRAIN) IMPLANT
GAUZE XEROFORM 5X9 LF (GAUZE/BANDAGES/DRESSINGS) ×2 IMPLANT
GLOVE BIO SURGEON STRL SZ7.5 (GLOVE) ×2 IMPLANT
GLOVE BIO SURGEON STRL SZ8.5 (GLOVE) ×4 IMPLANT
GLOVE BIOGEL PI IND STRL 8 (GLOVE) ×2 IMPLANT
GLOVE BIOGEL PI IND STRL 9 (GLOVE) ×1 IMPLANT
GLOVE BIOGEL PI INDICATOR 8 (GLOVE) ×2
GLOVE BIOGEL PI INDICATOR 9 (GLOVE) ×1
GOWN PREVENTION PLUS XLARGE (GOWN DISPOSABLE) ×6 IMPLANT
GOWN STRL NON-REIN LRG LVL3 (GOWN DISPOSABLE) ×4 IMPLANT
GOWN STRL REIN XL XLG (GOWN DISPOSABLE) ×4 IMPLANT
HANDPIECE INTERPULSE COAX TIP (DISPOSABLE)
HEAD 40MM (Hips) ×1 IMPLANT
HEEL PROTECTOR  874200 (MISCELLANEOUS)
HEEL PROTECTOR 874200 (MISCELLANEOUS) IMPLANT
HOOD PEEL AWAY FACE SHEILD DIS (HOOD) ×4 IMPLANT
KIT BASIN OR (CUSTOM PROCEDURE TRAY) ×2 IMPLANT
KIT ROOM TURNOVER OR (KITS) ×2 IMPLANT
LINER PINN CONS 62X40P4-10 (Liner) IMPLANT
LINER PINNACLE CONS 62X40P4-10 (Liner) ×2 IMPLANT
MANIFOLD NEPTUNE II (INSTRUMENTS) ×2 IMPLANT
NEEDLE 22X1 1/2 (OR ONLY) (NEEDLE) ×2 IMPLANT
NOZZLE PRISM 8.5MM (MISCELLANEOUS) IMPLANT
NS IRRIG 1000ML POUR BTL (IV SOLUTION) ×4 IMPLANT
PACK TOTAL JOINT (CUSTOM PROCEDURE TRAY) ×2 IMPLANT
PAD ARMBOARD 7.5X6 YLW CONV (MISCELLANEOUS) ×4 IMPLANT
PASSER SUT SWANSON 36MM LOOP (INSTRUMENTS) IMPLANT
PRESSURIZER FEMORAL UNIV (MISCELLANEOUS) IMPLANT
SET HNDPC FAN SPRY TIP SCT (DISPOSABLE) IMPLANT
SLEEVE SURGEON STRL (DRAPES) IMPLANT
SPONGE GAUZE 4X4 12PLY (GAUZE/BANDAGES/DRESSINGS) ×2 IMPLANT
SPONGE LAP 18X18 X RAY DECT (DISPOSABLE) IMPLANT
STAPLER VISISTAT 35W (STAPLE) ×2 IMPLANT
SUT ETHIBOND 2 V 37 (SUTURE) ×2 IMPLANT
SUT ETHILON 3 0 FSL (SUTURE) ×2 IMPLANT
SUT VIC AB 0 CTB1 27 (SUTURE) ×2 IMPLANT
SUT VIC AB 1 CTX 36 (SUTURE) ×2
SUT VIC AB 1 CTX36XBRD ANBCTR (SUTURE) ×1 IMPLANT
SUT VIC AB 2-0 CTB1 (SUTURE) ×2 IMPLANT
SYR 20ML ECCENTRIC (SYRINGE) ×2 IMPLANT
SYR CONTROL 10ML LL (SYRINGE) ×2 IMPLANT
TAPE CLOTH SURG 4X10 WHT LF (GAUZE/BANDAGES/DRESSINGS) ×1 IMPLANT
TOWEL OR 17X24 6PK STRL BLUE (TOWEL DISPOSABLE) ×2 IMPLANT
TOWEL OR 17X26 10 PK STRL BLUE (TOWEL DISPOSABLE) ×2 IMPLANT
TOWER CARTRIDGE SMART MIX (DISPOSABLE) IMPLANT
TRAY FOLEY CATH 14FR (SET/KITS/TRAYS/PACK) IMPLANT
TUBE ANAEROBIC SPECIMEN COL (MISCELLANEOUS) ×2 IMPLANT
WATER STERILE IRR 1000ML POUR (IV SOLUTION) ×6 IMPLANT

## 2012-11-28 NOTE — Anesthesia Procedure Notes (Signed)
Procedure Name: Intubation Date/Time: 11/28/2012 1:30 PM Performed by: Lanell Matar Pre-anesthesia Checklist: Patient identified, Emergency Drugs available, Suction available, Patient being monitored and Timeout performed Patient Re-evaluated:Patient Re-evaluated prior to inductionOxygen Delivery Method: Circle system utilized Preoxygenation: Pre-oxygenation with 100% oxygen Intubation Type: IV induction Ventilation: Mask ventilation without difficulty and Oral airway inserted - appropriate to patient size Laryngoscope Size: Hyacinth Meeker and 2 Grade View: Grade I Tube type: Oral Tube size: 7.5 mm Number of attempts: 1 Airway Equipment and Method: Stylet Placement Confirmation: positive ETCO2,  CO2 detector,  ETT inserted through vocal cords under direct vision and breath sounds checked- equal and bilateral Secured at: 22 cm Tube secured with: Tape Dental Injury: Teeth and Oropharynx as per pre-operative assessment

## 2012-11-28 NOTE — Anesthesia Postprocedure Evaluation (Signed)
Anesthesia Post Note  Patient: [name]  Procedure(s) Performed: Procedure(s) (LRB): TOTAL HIP REVISION With Placement of Contrain Liner (Right)  Anesthesia type: general  Patient location: PACU  Post pain: Pain level controlled  Post assessment: Patient's Cardiovascular Status Stable  Last Vitals:  Filed Vitals:   11/28/12 1600  BP: 154/93  Pulse: 89  Temp: 36.6 C  Resp: 16    Post vital signs: Reviewed and stable  Level of consciousness: sedated  Complications: No apparent anesthesia complications

## 2012-11-28 NOTE — Anesthesia Preprocedure Evaluation (Addendum)
Anesthesia Evaluation  Patient identified by MRN, date of birth, ID band Patient awake    Reviewed: Allergy & Precautions, H&P , NPO status , Patient's Chart, lab work & pertinent test results  Airway Mallampati: II TM Distance: >3 FB Neck ROM: full    Dental  (+) Edentulous Upper and Dental Advisory Given   Pulmonary neg pulmonary ROS,          Cardiovascular hypertension, + dysrhythmias Atrial Fibrillation + pacemaker     Neuro/Psych Anxiety Depression negative neurological ROS     GI/Hepatic   Endo/Other  diabetes, Type 2Hypothyroidism   Renal/GU      Musculoskeletal  (+) Arthritis -,   Abdominal   Peds  Hematology   Anesthesia Other Findings   Reproductive/Obstetrics                          Anesthesia Physical Anesthesia Plan  ASA: III  Anesthesia Plan: General   Post-op Pain Management:    Induction: Intravenous  Airway Management Planned: Oral ETT  Additional Equipment:   Intra-op Plan:   Post-operative Plan: Extubation in OR  Informed Consent: I have reviewed the patients History and Physical, chart, labs and discussed the procedure including the risks, benefits and alternatives for the proposed anesthesia with the patient or authorized representative who has indicated his/her understanding and acceptance.     Plan Discussed with: CRNA, Anesthesiologist and Surgeon  Anesthesia Plan Comments:         Anesthesia Quick Evaluation

## 2012-11-28 NOTE — Interval H&P Note (Signed)
History and Physical Interval Note:  11/28/2012 12:22 PM  [name]  has presented today for surgery, with the diagnosis of Right Hip Multiple Dislocations  The various methods of treatment have been discussed with the patient and family. After consideration of risks, benefits and other options for treatment, the patient has consented to  Procedure(s): TOTAL HIP REVISION With Placement of Contrain Liner (Right) as a surgical intervention .  The patient's history has been reviewed, patient examined, no change in status, stable for surgery.  I have reviewed the patient's chart and labs.  Questions were answered to the patient's satisfaction.     Nestor Lewandowsky

## 2012-11-28 NOTE — Transfer of Care (Signed)
Immediate Anesthesia Transfer of Care Note  Patient: [name]  Procedure(s) Performed: Procedure(s): TOTAL HIP REVISION With Placement of Contrain Liner (Right)  Patient Location: PACU  Anesthesia Type:General  Level of Consciousness: awake, alert , oriented and patient cooperative  Airway & Oxygen Therapy: Patient Spontanous Breathing and Patient connected to nasal cannula oxygen  Post-op Assessment: Report given to PACU RN and Post -op Vital signs reviewed and stable  Post vital signs: Reviewed and stable  Complications: No apparent anesthesia complications

## 2012-11-28 NOTE — Op Note (Signed)
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  Post Op Dx: Same   Procedure: Irrigation and debridement of infected right total hip with removal of all synovium, revision of femoral head to a +5 40 mm metal head, revision of polyethylene liner to a constrained liner 10 index posterior superior placement of medium bore Hemovac drains.   Surgeon: Nestor Lewandowsky, MD   Assistant: Tomi Likens. Gaylene Brooks (present throughout entire procedure and necessary for timely completion of the procedure)   Anesthesia: General   Specimens: Intraoperative Gram stain came back occasional monos and polys no organisms.  EBL: 500 cc Fluids: 1500 cc of crystalloid   Indications: S/p R Hip replacement in 2012. Did well until late July of 2014 when he had the onset of low back pain with increasing right lower extremity weakness, he had had back surgery in January 2014 on admission CT scan showed a large retroperitoneal hematoma and his INR was 7.2. The hematoma was drained percutaneously but there is also fluid collection around the hip and ultimately had a radical irrigation and debridement with synovectomy and placement of large bore drains in August of 2014 he was on 6 weeks of IV antibiotics and in 6 weeks of oral antibiotics and has had 3 dislocations a geometrically stable hip since the irrigation and debridement. His sed rate and CRP have been decreasing and he has not been clinically ill. Dislocations have all been posterior. Placement of constrained liner has been discussed with the wife and the patient including all risks and benefits questions have been answered. The goal is to stabilize his hip which is probably been destabilized by weakness of the muscles absence of the capsule and possible recurrent infection. If the hip is grossly infected we may remove the parts.  Procedure: Patient identified by arm band and taken to  operating room at [facility] appropriate, anesthetic monitors were attached and general endotracheal anesthesia induced with the patient in the supine position. Foley catheter was inserted he was rolled into the lateral decubitus position and R lower extremity prepped and draped in usual sterile fashion from the ankle to the hemipelvis. Time out procedure performed and we began the operation by recreating the old total hip incision lateral to posterior lateral. The skin and subcutaneous tissue were incised and dissected down to the iliotibial band. As we sized to the IT band clear fluid mixed with blood was noted to come forth and there was around and the posterior hip capsule were the hip had been dislocating. There is fibrinous material in the joint fluid but nothing appeared to be grossly purulent. At this time tissue Gram stain and culture were sent to the lab for Gram stain with staff and came back occasional monos and polys no organisms. We then performed a radical synovectomy around the hip to expose the rim of the acetabular component. The hip was then flexed and internally rotated dislocating the femoral head which was removed with a mallet and a metal cylinder. We remove scar tissue from around the stem and was found to be well placed and well fixed. The trunnion was then tucked superior and anterior to the acetabular shell and scar tissue is remote removed around the shell allowing [place] to remove the polyethylene liner the shell was also well fixed. There were marks on the posterior superior aspect of the external shell where the hadn't been dislocating. The wound was then irrigated thoroughly  with normal saline solution and a constrained liner for a 62 mm pedicle cell was selected and hammered into place with the index posterior and superior. The locking ring was then placed on the trunnion with the bevel pointed towards the liner and a +5 40 mm metal head hammered onto the trunnion which was then  reduced and the locking ring applied. After the sharp dissection the wound was then irrigated with pulse lavage alternating with hydrogen peroxide. Medium bore Hemovac drains were placed in the hip joint itself as well as the interval between the iliotibial band and the vastus lateralis. We then closed in layers with running 1-0 Vicryl suture in the IT band, running 0 and 2-0 Vicryl in the subcutaneous tissue and skin staples. Aquacell dressing was applied, the patient was unclamped rolled supine awakened extubated and taken to the recovery room for

## 2012-11-29 ENCOUNTER — Encounter (HOSPITAL_COMMUNITY): Payer: Self-pay | Admitting: Orthopedic Surgery

## 2012-11-29 LAB — GLUCOSE, CAPILLARY
Glucose-Capillary: 153 mg/dL — ABNORMAL HIGH (ref 70–99)
Glucose-Capillary: 176 mg/dL — ABNORMAL HIGH (ref 70–99)
Glucose-Capillary: 126 mg/dL — ABNORMAL HIGH (ref 70–99)

## 2012-11-29 LAB — BASIC METABOLIC PANEL
BUN: 11 mg/dL (ref 6–23)
CO2: 28 mEq/L (ref 19–32)
Calcium: 8.4 mg/dL (ref 8.4–10.5)
Chloride: 104 mEq/L (ref 96–112)
Creatinine, Ser: 1.01 mg/dL (ref 0.50–1.35)
GFR calc Af Amer: 79 mL/min — ABNORMAL LOW (ref >90)
Potassium: 4.2 mEq/L (ref 3.5–5.1)

## 2012-11-29 LAB — CBC
Hemoglobin: 8.3 g/dL — ABNORMAL LOW (ref 13.0–17.0)
MCH: 31.3 pg (ref 26.0–34.0)
MCV: 96.2 fL (ref 78.0–100.0)
Platelets: 190 10*3/uL (ref 150–400)
RBC: 2.65 MIL/uL — ABNORMAL LOW (ref 4.22–5.81)
WBC: 7 10*3/uL (ref 4.0–10.5)

## 2012-11-29 MED ORDER — ENSURE COMPLETE PO LIQD
237.0000 mL | Freq: Two times a day (BID) | ORAL | Status: DC
  Administered 2012-11-29 – 2012-12-02 (×7): 237 mL via ORAL

## 2012-11-29 NOTE — Progress Notes (Addendum)
Clinical Social Work Department CLINICAL SOCIAL WORK PLACEMENT NOTE 11/29/2012  Patient:  [name], [name]  Account Number:  [id number] Admit date:  11/25/2012  Clinical Social Worker:  Sharol Harness, Theresia Majors  Date/time:  11/29/2012 01:00 PM  Clinical Social Work is seeking post-discharge placement for this patient at the following level of care:   SKILLED NURSING   (*CSW will update this form in Epic as items are completed)   11/29/2012  Patient/family provided with [facility] Department of Clinical Social Work's list of facilities offering this level of care within the geographic area requested by the patient (or if unable, by the patient's family).  11/29/2012  Patient/family informed of their freedom to choose among providers that offer the needed level of care, that participate in Medicare, Medicaid or managed care program needed by the patient, have an available bed and are willing to accept the patient.  11/29/2012  Patient/family informed of [facility]' ownership interest in [facility], as well as of the fact that they are under no obligation to receive care at this facility.  PASARR submitted to EDS on  PASARR number received from EDS on   FL2 transmitted to all facilities in geographic area requested by pt/family on  11/29/2012 FL2 transmitted to all facilities within larger geographic area on   Patient informed that his/her managed care company has contracts with or will negotiate with  certain facilities, including the following:     Patient/family informed of bed offers received:  11/30/2012 Patient chooses bed at [facility] Physician recommends and patient chooses bed at    Patient to be transferred to [name]  on  11.07/2012 Patient to be transferred to facility by [facility]  The following physician request were entered in Epic:   Additional Comments:  Jamale Spangler, LCSWA [phone]

## 2012-11-29 NOTE — Progress Notes (Signed)
INITIAL NUTRITION ASSESSMENT  DOCUMENTATION CODES Per approved criteria  -Not Applicable   INTERVENTION:  1. Ensure Complete po BID, each supplement provides 350 kcal and 13 grams of protein.  NUTRITION DIAGNOSIS: Unintentional weight loss related to pain as evidenced by 6% weight loss x 3 months.   Goal: Pt to meet >/= 90% of their estimated nutrition needs   Monitor:  PO intake, supplement acceptance, weight trend, labs  Reason for Assessment: Pt identified as at nutrition risk on the Malnutrition Screen Tool  79 y.o. male  Admitting Dx: <principal problem not specified>  ASSESSMENT: Pt admitted with recurrent hip dislocation. Per pt he has lost weight 25 lb in 3-4 months due to frequent trips to hospital for recurrent hip dislocation after his original replacement. Pt reports poor appetite and not liking the food at the SNF.  Per pt he eats an egg at Breakfast, 1/2 sandwich for lunch, and dinner is the dessert, applesauce, and some toast.  Per chart review pt with 6% weight loss x 3 months. Edema could be masking a greater weight loss.   Nutrition Focused Physical Exam:  Subcutaneous Fat:  Orbital Region: WNL Upper Arm Region: WNL Thoracic and Lumbar Region: WNL  Muscle:  Temple Region: WNL Clavicle Bone Region: WNL Clavicle and Acromion Bone Region: WNL Scapular Bone Region: WNL Dorsal Hand: WNL Patellar Region: WNL Anterior Thigh Region: WNL Posterior Calf Region: WNL  Edema: 2+   Height: Ht Readings from Last 1 Encounters:  11/28/12 5\' 11"  (1.803 m)    Weight: Wt Readings from Last 1 Encounters:  11/28/12 214 lb 15.2 oz (97.5 kg)    Ideal Body Weight: 78.1 kg   % Ideal Body Weight: 125%  Wt Readings from Last 10 Encounters:  11/28/12 214 lb 15.2 oz (97.5 kg)  11/28/12 214 lb 15.2 oz (97.5 kg)  11/25/12 215 lb (97.523 kg)  11/23/12 215 lb (97.523 kg)  10/20/12 220 lb (99.791 kg)  10/03/12 220 lb (99.791 kg)  10/03/12 220 lb (99.791 kg)   09/07/12 228 lb 8 oz (103.647 kg)  09/07/12 228 lb 8 oz (103.647 kg)  09/07/12 228 lb 8 oz (103.647 kg)    Usual Body Weight: 228 lb   % Usual Body Weight: 94%  BMI:  Body mass index is 29.99 kg/(m^2).  Estimated Nutritional Needs: Kcal: 2000-2200 Protein: 100-110 grams Fluid: > 2 L/day  Skin: right hip incision   Diet Order: Carb Control Meal Completion: 100%   EDUCATION NEEDS: -No education needs identified at this time   Intake/Output Summary (Last 24 hours) at 11/29/12 0941 Last data filed at 11/29/12 0900  Gross per 24 hour  Intake   2600 ml  Output   1560 ml  Net   1040 ml    Last BM: 10/30   Labs:   Recent Labs Lab 11/23/12 0940 11/25/12 1430 11/29/12 0538  NA 139 142 138  K 3.0* 3.5 4.2  CL 102 101 104  CO2 24  --  28  BUN 13 11 11   CREATININE 0.94 1.20 1.01  CALCIUM 9.1  --  8.4  GLUCOSE 105* 187* 153*    CBG (last 3)   Recent Labs  11/28/12 1820 11/28/12 2136 11/29/12 0644  GLUCAP 78 118* 153*    Scheduled Meds: . aspirin  325 mg Oral Daily  . cephALEXin  500 mg Oral QID  . diltiazem  120 mg Oral Daily  . docusate sodium  100 mg Oral BID  . escitalopram  10  mg Oral Daily  . ferrous sulfate  325 mg Oral Q breakfast  . finasteride  5 mg Oral Daily  . furosemide  40 mg Oral Daily  . insulin aspart  0-15 Units Subcutaneous TID WC  . levothyroxine  88 mcg Oral QAC breakfast  . metoprolol succinate  12.5 mg Oral Daily  . pioglitazone  15 mg Oral Q breakfast  . polyethylene glycol  17 g Oral Daily  . potassium chloride SA  20 mEq Oral Daily  . propofol  50 mg Intravenous Once  . rifampin  300 mg Oral BID  . simvastatin  5 mg Oral q1800  . terazosin  5 mg Oral QHS  . vitamin C  1,000 mg Oral Daily    Continuous Infusions: . dextrose 5 % and 0.45 % NaCl with KCl 20 mEq/L 125 mL/hr at 11/29/12 0359    Past Medical History  Diagnosis Date  . Atrial fibrillation   . Hypertension   . Pacemaker   . Diabetes mellitus without  complication     Borderline  . Arthritis   . Anxiety   . Depression   . MSSA (methicillin susceptible Staphylococcus aureus) infection   . Bacteremia   . Infected pacemaker   . Septic hip     Past Surgical History  Procedure Laterality Date  . Pacemaker insertion    . Lumbar laminectomy/decompression microdiscectomy  02/14/2012    Procedure: LUMBAR LAMINECTOMY/DECOMPRESSION MICRODISCECTOMY 1 LEVEL;  Surgeon: Tia Alert, MD;  Location: [facility] NEURO ORS;  Service: Neurosurgery;  Laterality: N/A;  Thoracic twelve - Lumbar one decompressive laminectomy.  Rhae Hammock without cardioversion N/A 08/30/2012    Procedure: TRANSESOPHAGEAL ECHOCARDIOGRAM (TEE);  Surgeon: Laurey Morale, MD;  Location: [facility] ENDOSCOPY;  Service: Cardiovascular;  Laterality: N/A;  . Icd lead removal Left 08/31/2012    Procedure: ICD LEAD REMOVAL;  Surgeon: Marinus Maw, MD;  Location: [facility] OR;  Service: Cardiovascular;  Laterality: Left;  . Incision and drainage hip Right 09/02/2012    Procedure: IRRIGATION AND DEBRIDEMENT RIGHT HIP;  Surgeon: Nestor Lewandowsky, MD;  Location: [facility] OR;  Service: Orthopedics;  Laterality: Right;  . Total hip revision Right 09/02/2012    Procedure: REVISION OF POLYETHYLENE LINER AND FEMORAL HEAD ;  Surgeon: Nestor Lewandowsky, MD;  Location: [facility] OR;  Service: Orthopedics;  Laterality: Right;  . Hip closed reduction Right 10/02/2012    Procedure: CLOSED REDUCTION HIP;  Surgeon: Velna Ochs, MD;  Location: [facility] OR;  Service: Orthopedics;  Laterality: Right;    Kendell Bane RD, LDN, CNSC [phone] Pager [phone] After Hours Pager

## 2012-11-29 NOTE — Progress Notes (Signed)
Patient ID: [name], male   DOB: [birth year], 79 y.o.   MRN: [id number] PATIENT ID: [name]  MRN: [id number]  DOB/AGE:  [birth year] / 79 y.o.  1 Day Post-Op Procedure(s) (LRB): TOTAL HIP REVISION With Placement of Contrain Liner (Right)    PROGRESS NOTE Subjective: Patient is alert, oriented,no Nausea, no Vomiting, yes passing gas, no Bowel Movement. Taking PO well. Denies SOB, Chest or Calf Pain. Using Incentive Spirometer, PAS in place. Ambulate WBAT Patient reports pain as 3 on 0-10 scale  .    Objective: Vital signs in last 24 hours: Filed Vitals:   11/29/12 0000 11/29/12 0232 11/29/12 0400 11/29/12 0540  BP:  116/74  131/84  Pulse:  75  100  Temp:  98.2 F (36.8 C)  98.4 F (36.9 C)  TempSrc:  Oral  Oral  Resp: 15 18 16 16   Height:      Weight:      SpO2:  98%  98%      Intake/Output from previous day: I/O last 3 completed shifts: In: 2600 [P.O.:600; I.V.:2000] Out: 1760 [Urine:1150; Drains:260; Blood:350]   Intake/Output this shift:     LABORATORY DATA:  Recent Labs  11/28/12 1820 11/28/12 1900 11/28/12 2136 11/29/12 0538 11/29/12 0644  WBC  --   --   --  7.0  --   HGB  --  9.5*  --  8.3*  --   HCT  --  28.4*  --  25.5*  --   PLT  --   --   --  190  --   NA  --   --   --  138  --   K  --   --   --  4.2  --   CL  --   --   --  104  --   CO2  --   --   --  28  --   BUN  --   --   --  11  --   CREATININE  --   --   --  1.01  --   GLUCOSE  --   --   --  153*  --   GLUCAP 78  --  118*  --  153*  CALCIUM  --   --   --  8.4  --     Examination: Neurologically intact ABD soft Neurovascular intact Sensation intact distally Intact pulses distally Dorsiflexion/Plantar flexion intact Incision: dressing C/D/I No cellulitis present Compartment soft} XR AP&Lat of hip shows well placed\fixed ConstrainedTHA. Blood and plasma separated in drain indicating minimal recent drainage, drain pulled without difficulty.   Assessment:   1 Day Post-Op  Procedure(s) (LRB): TOTAL HIP REVISION With Placement of Contrain Liner (Right) ADDITIONAL DIAGNOSIS:  Diabetes, Hypertension and spinal stenosis, chronic anemia, status post retroperitoneal abscess and periprosthetic right total hip infection August 2014.  Plan: PT/OT WBAT, THA  posterior precautions,Continue antibiotics per infectious disease consultant for retroperitoneal abscess and periprosthetic right total hip infection August 2014  DVT Prophylaxis: SCDx72 hrs, ASA 325 mg BID x 2 weeks  DISCHARGE PLAN: Skilled Nursing Facility/Rehab  DISCHARGE NEEDS: HHPT, HHRN, CPM, Walker and 3-in-1 comode seat

## 2012-11-29 NOTE — Evaluation (Signed)
Occupational Therapy Evaluation Patient Details Name: [name] MRN: [id number] DOB: 17-Jun-1930 Today's Date: 11/29/2012 Time: 4098-1191 OT Time Calculation (min): 47 min  OT Assessment / Plan / Recommendation History of present illness Pt is an 79 y/o male admitted for a total hip revision after multiple dislocations and infection.   Clinical Impression   Pt is currently at a min assist level for selfcare tasks and transfers to 3:1.  He continues to need max assist for bed mobility as well as limited endurance.  He is only able to tolerate standing for 1-2 mins during mobility at this time.  Feel he will benefit from acute care OT to help increase overall independence with selfcare and ADLs.  Feel he will be best to discharge to SNF for continued rehab before discharging home with his wife.    OT Assessment  Patient needs continued OT Services    Follow Up Recommendations  SNF;Supervision/Assistance - 24 hour       Equipment Recommendations  None recommended by OT       Frequency  Min 2X/week    Precautions / Restrictions Precautions Precautions: Posterior Hip;Fall Restrictions Weight Bearing Restrictions: No RLE Weight Bearing: Weight bearing as tolerated   Pertinent Vitals/Pain Pain 2/10 on the faces scale in the right hip   ADL  Eating/Feeding: Performed;Independent Where Assessed - Eating/Feeding: Edge of bed Grooming: Performed;Teeth care;Set up Where Assessed - Grooming: Unsupported sitting Upper Body Bathing: Simulated;Set up Where Assessed - Upper Body Bathing: Unsupported sitting Lower Body Bathing: Simulated;Minimal assistance Where Assessed - Lower Body Bathing: Supported sit to stand Upper Body Dressing: Simulated;Set up Where Assessed - Upper Body Dressing: Unsupported sitting Lower Body Dressing: Performed;Minimal assistance Where Assessed - Lower Body Dressing: Supported sit to stand Toilet Transfer: Minimal assistance;Simulated Toilet Transfer  Method: Other (comment);Stand pivot Toilet Transfer Equipment: Materials engineer and Hygiene: Simulated;Minimal assistance Where Assessed - Engineer, mining and Hygiene: Other (comment) (simulated sit to stand from the EOB) Tub/Shower Transfer Method: Not assessed Equipment Used: Reacher;Rolling walker;Sock aid;Gait belt Transfers/Ambulation Related to ADLs: Pt is able to ambulate to the door of his room with min assist using the RW. ADL Comments: Pt is able to state 3/3 THR precautions.  Reviewed use of AE which pt was actually using prior to this admission.  Still with limited endurance as he needed to sit after walking around the edge of the bed to the other side of the room.  HR 115 BPM with O2 sats 99% on room air.    OT Diagnosis: Generalized weakness;Acute pain  OT Problem List: Decreased strength;Decreased activity tolerance;Impaired balance (sitting and/or standing);Pain;Decreased knowledge of use of DME or AE OT Treatment Interventions: Self-care/ADL training;Balance training;Patient/family education;Therapeutic activities;DME and/or AE instruction   OT Goals(Current goals can be found in the care plan section) Acute Rehab OT Goals Patient Stated Goal: Did not state this session but wants to get out of the hospital and back to normal activities. OT Goal Formulation: With patient Time For Goal Achievement: 12/06/12 Potential to Achieve Goals: Good ADL Goals Pt Will Perform Grooming: with supervision;standing (3 tasks standing for 4 mins) Pt Will Perform Lower Body Bathing: with supervision;sit to/from stand;with adaptive equipment Pt Will Perform Lower Body Dressing: with supervision;with adaptive equipment;sit to/from stand Pt Will Transfer to Toilet: with supervision;ambulating;bedside commode Pt Will Perform Toileting - Clothing Manipulation and hygiene: with supervision;sit to/from stand  Visit Information  Last OT Received On:  11/29/12 Assistance Needed: +1 History of Present Illness:  Pt is an 79 y/o male admitted for a total hip revision after multiple dislocations and infection.       Prior Functioning     Home Living Family/patient expects to be discharged to:: Skilled nursing facility Living Arrangements: Spouse/significant other Available Help at Discharge: Skilled Nursing Facility Home Equipment: Dan Humphreys - 2 wheels;Cane - single point;Bedside commode;Grab bars - tub/shower Additional Comments: Wife able to help him around the house Prior Function Level of Independence: Needs assistance Comments: Unclear how much he was doing independently at home PTA. Pt having a hard time differentiating between episodes with the hip, Communication Communication: HOH Dominant Hand: Right         Vision/Perception Vision - History Baseline Vision: No visual deficits Patient Visual Report: No change from baseline Vision - Assessment Vision Assessment: Vision not tested Perception Perception: Within Functional Limits Praxis Praxis: Intact   Cognition  Cognition Arousal/Alertness: Awake/alert Behavior During Therapy: WFL for tasks assessed/performed Overall Cognitive Status: Within Functional Limits for tasks assessed    Extremity/Trunk Assessment Upper Extremity Assessment Upper Extremity Assessment: Overall WFL for tasks assessed Lower Extremity Assessment Lower Extremity Assessment: Defer to PT evaluation Cervical / Trunk Assessment Cervical / Trunk Assessment: Kyphotic     Mobility Bed Mobility Bed Mobility: Supine to Sit;Sit to Supine Supine to Sit: 2: Max assist;HOB flat Sitting - Scoot to Edge of Bed: 3: Mod assist Sit to Supine: HOB flat;2: Max assist Details for Bed Mobility Assistance: Pt required assistance to move the RLE off of the bed as well as into the bed.  In addition, he required assistance to bring his UB up to sitting.   Transfers Transfers: Sit to Stand Sit to Stand: 4: Min  assist;From elevated surface;From bed;With upper extremity assist Stand to Sit: 4: Min assist;With upper extremity assist;To elevated surface;To bed        Balance Balance Balance Assessed: Yes Static Standing Balance Static Standing - Balance Support: Right upper extremity supported;Left upper extremity supported Static Standing - Level of Assistance: 4: Min assist   End of Session OT - End of Session Equipment Utilized During Treatment: Gait belt;Rolling walker Activity Tolerance: Patient limited by fatigue Patient left: in bed;with call bell/phone within reach Nurse Communication: Mobility status     Madelyn Tlatelpa OTR/L 11/29/2012, 3:55 PM

## 2012-11-29 NOTE — Evaluation (Signed)
Physical Therapy Evaluation Patient Details Name: Jamont Mellin MRN: [id number] DOB: 01-Jul-1930 Today's Date: 11/29/2012 Time: 9735-3299 PT Time Calculation (min): 31 min  PT Assessment / Plan / Recommendation History of Present Illness  Pt is an 79 y/o male admitted for a total hip revision after multiple dislocations and infection.  Clinical Impression  This patient presents with acute pain and decreased functional independence following the above mentioned procedure. At the time of PT eval, pt required +2 assist for transfers and min assist +1 for ambulation with RW. This patient is appropriate for skilled PT interventions to address functional limitations, improve safety and independence with functional mobility, and return to PLOF.     PT Assessment  Patient needs continued PT services    Follow Up Recommendations  SNF    Does the patient have the potential to tolerate intense rehabilitation      Barriers to Discharge        Equipment Recommendations  None recommended by PT    Recommendations for Other Services     Frequency Min 6X/week    Precautions / Restrictions Precautions Precautions: Posterior Hip;Fall Restrictions Weight Bearing Restrictions: Yes RLE Weight Bearing: Weight bearing as tolerated   Pertinent Vitals/Pain 6/10 at rest      Mobility  Bed Mobility Bed Mobility: Supine to Sit;Sitting - Scoot to Edge of Bed Supine to Sit: 1: +2 Total assist;HOB elevated;With rails Supine to Sit: Patient Percentage: 40% Sitting - Scoot to Edge of Bed: 1: +2 Total assist Sitting - Scoot to Edge of Bed: Patient Percentage: 40% Details for Bed Mobility Assistance: VC's for sequencing and safety awareness. Assist to maintain hip precautions and steady trunk when coming to full sit on EOB. Transfers Transfers: Sit to Stand;Stand to Sit Sit to Stand: 1: +2 Total assist;From bed;With upper extremity assist Sit to Stand: Patient Percentage: 40% Stand to Sit: 1: +2  Total assist;To chair/3-in-1;With upper extremity assist Stand to Sit: Patient Percentage: 50% Details for Transfer Assistance: VC's for hand placement on seated surface as well as to maintain hip precautions when leaning forward prior to initiation of stand. Bed elevated slightly. Ambulation/Gait Ambulation/Gait Assistance: 4: Min assist Ambulation Distance (Feet): 8 Feet Assistive device: Rolling walker Ambulation/Gait Assistance Details: VC's for sequencing with the RW. No LOB noted. Gait Pattern: Step-to pattern;Decreased stride length;Narrow base of support Gait velocity: Decreased Stairs: No    Exercises Total Joint Exercises Ankle Circles/Pumps: 10 reps Quad Sets: 10 reps   PT Diagnosis: Difficulty walking;Acute pain  PT Problem List: Decreased strength;Decreased range of motion;Decreased activity tolerance;Decreased balance;Decreased mobility;Decreased knowledge of use of DME;Pain;Decreased safety awareness;Decreased knowledge of precautions PT Treatment Interventions: DME instruction;Gait training;Stair training;Functional mobility training;Therapeutic activities;Therapeutic exercise;Neuromuscular re-education;Patient/family education     PT Goals(Current goals can be found in the care plan section) Acute Rehab PT Goals Patient Stated Goal: To return home with wife PT Goal Formulation: With patient Time For Goal Achievement: 12/06/12 Potential to Achieve Goals: Fair  Visit Information  Last PT Received On: 11/29/12 Assistance Needed: +2 History of Present Illness: Pt is an 79 y/o male admitted for a total hip revision after multiple dislocations and infection.       Prior Functioning  Home Living Family/patient expects to be discharged to:: Skilled nursing facility Living Arrangements: Spouse/significant other Additional Comments: Wife able to help him around the house Prior Function Level of Independence: Needs assistance Comments: Unclear how much he was doing  independently at home PTA. Pt having a hard time differentiating between episodes  with the hip, Communication Communication: HOH Dominant Hand: Right    Cognition  Cognition Arousal/Alertness: Awake/alert Behavior During Therapy: WFL for tasks assessed/performed Overall Cognitive Status: Within Functional Limits for tasks assessed Memory: Decreased short-term memory;Decreased recall of precautions    Extremity/Trunk Assessment Upper Extremity Assessment Upper Extremity Assessment: Defer to OT evaluation Lower Extremity Assessment Lower Extremity Assessment: RLE deficits/detail RLE Deficits / Details: Decreased strength and AROM consistent with total hip revision RLE: Unable to fully assess due to pain Cervical / Trunk Assessment Cervical / Trunk Assessment: Kyphotic   Balance    End of Session PT - End of Session Equipment Utilized During Treatment: Gait belt Activity Tolerance: Patient limited by pain Patient left: in chair;with call bell/phone within reach;with nursing/sitter in room Nurse Communication: Mobility status  GP     Ruthann Cancer 11/29/2012, 1:09 PM  Ruthann Cancer, PT, DPT [phone]

## 2012-11-29 NOTE — Progress Notes (Signed)
Clinical Social Work Department BRIEF PSYCHOSOCIAL ASSESSMENT 11/29/2012  Patient:  [name], [name]     Account Number:  [id number]     Admit date:  11/25/2012  Clinical Social Worker:  Harless Nakayama  Date/Time:  11/29/2012 11:00 AM  Referred by:  Physician  Date Referred:  11/29/2012 Referred for  SNF Placement   Other Referral:   Interview type:  Patient Other interview type:   Pt wife was also at bedside    PSYCHOSOCIAL DATA Living Status:  WIFE Admitted from facility:   Level of care:   Primary support name:  Steen Bisig [phone] Primary support relationship to patient:  SPOUSE Degree of support available:   Pt has supportive family    CURRENT CONCERNS Current Concerns  Post-Acute Placement   Other Concerns:    SOCIAL WORK ASSESSMENT / PLAN CSW received consult for SNF but aware PT has not yet assessed pt. CSW spoke with pt about possible need for ST rehab. Pt and pt wife understanding of process as pt has been to [facility] in the past for rehab. Pt and wife agreeable to being faxed out to [name] if PT recommends SNF. At this time, pt and pt wife unfamiliar with other facilities and not wanting to being referred out to other facilities. CSW to refer out if needed and will follow up with pt as recommendations are made by PT.   Assessment/plan status:  Psychosocial Support/Ongoing Assessment of Needs Other assessment/ plan:   Information/referral to community resources:   SNF list denied    PATIENT'S/FAMILY'S RESPONSE TO PLAN OF CARE: Pt and pt wife agreeable to SNF.       Princeston Blizzard, LCSWA [phone]

## 2012-11-30 LAB — GLUCOSE, CAPILLARY
Glucose-Capillary: 103 mg/dL — ABNORMAL HIGH (ref 70–99)
Glucose-Capillary: 112 mg/dL — ABNORMAL HIGH (ref 70–99)

## 2012-11-30 LAB — TYPE AND SCREEN
ABO/RH(D): A POS
Antibody Screen: NEGATIVE
Unit division: 0
Unit division: 0
Unit division: 0

## 2012-11-30 LAB — CBC
MCHC: 32.9 g/dL (ref 30.0–36.0)
Platelets: 173 10*3/uL (ref 150–400)
RDW: 14.6 % (ref 11.5–15.5)

## 2012-11-30 NOTE — Progress Notes (Signed)
PATIENT ID: [name]  MRN: [id number]  DOB/AGE:  04-Jul-1930 / 79 y.o.  2 Days Post-Op Procedure(s) (LRB): TOTAL HIP REVISION With Placement of Contrain Liner (Right)    PROGRESS NOTE Subjective: Patient is alert, oriented,no Nausea, no Vomiting, yes passing gas, no Bowel Movement. Taking PO well. Denies SOB, Chest or Calf Pain. Using Incentive Spirometer, PAS in place. Ambulate WBAT Patient reports pain as mild  .    Objective: Vital signs in last 24 hours: Filed Vitals:   11/29/12 2000 11/29/12 2349 11/30/12 0400 11/30/12 0607  BP:    132/75  Pulse:    90  Temp:    98.7 F (37.1 C)  TempSrc:    Oral  Resp: 16 16 16 16   Height:      Weight:      SpO2: 100% 100% 100% 100%      Intake/Output from previous day: I/O last 3 completed shifts: In: 5499.2 [P.O.:1320; I.V.:4179.2] Out: 2210 [Urine:2150; Drains:60]   Intake/Output this shift:     LABORATORY DATA:  Recent Labs  11/29/12 0538  11/29/12 1627 11/29/12 2213 11/30/12 0355 11/30/12 0658  WBC 7.0  --   --   --  6.6  --   HGB 8.3*  --   --   --  7.6*  --   HCT 25.5*  --   --   --  23.1*  --   PLT 190  --   --   --  173  --   NA 138  --   --   --   --   --   K 4.2  --   --   --   --   --   CL 104  --   --   --   --   --   CO2 28  --   --   --   --   --   BUN 11  --   --   --   --   --   CREATININE 1.01  --   --   --   --   --   GLUCOSE 153*  --   --   --   --   --   GLUCAP  --   < > 126* 171*  --  126*  CALCIUM 8.4  --   --   --   --   --   < > = values in this interval not displayed.  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  Plan: PT/OT WBAT, THA  posterior precautions  DVT Prophylaxis: SCDx72 hrs, ASA 325 mg BID x 2 weeks  DISCHARGE PLAN: Skilled Nursing Facility/Rehab  DISCHARGE NEEDS: HHPT, HHRN, Walker and 3-in-1 comode seat  Will follow the pt in regards to the post op anemia, he did not complain of dizziness or fatigue this AM.  If becomes symptomatic with therapy will consider transfusion.

## 2012-11-30 NOTE — Progress Notes (Signed)
CSW Proofreader) spoke with pt and pt wife about bed offer from [facility]. Pt would like to accept bed offer. CSW informed facility. Pt has bed available when medically stable.  [name], LCSWA [phone]

## 2012-11-30 NOTE — Progress Notes (Signed)
Physical Therapy Treatment Patient Details Name: [name] MRN: [id number] DOB: 05-Aug-1930 Today's Date: 11/30/2012 Time: 4098-1191 PT Time Calculation (min): 30 min  PT Assessment / Plan / Recommendation  History of Present Illness Pt is an 79 y/o male admitted for a total hip revision after multiple dislocations and infection.   PT Comments   Pt making progress with mobility today as he was able to perform sit<>stand with decreased assist as well as increase ambulation distance however cont to recommend SNF at d/c to maximize functional recovery & independence prior to d/cing home with wife.     Follow Up Recommendations  SNF     Does the patient have the potential to tolerate intense rehabilitation     Barriers to Discharge        Equipment Recommendations  None recommended by PT    Recommendations for Other Services    Frequency Min 6X/week   Progress towards PT Goals Progress towards PT goals: Progressing toward goals  Plan Current plan remains appropriate    Precautions / Restrictions Precautions Precautions: Posterior Hip;Fall Precaution Comments: reviewed hip precautions Restrictions RLE Weight Bearing: Weight bearing as tolerated       Mobility  Bed Mobility Bed Mobility: Not assessed Transfers Transfers: Sit to Stand;Stand to Sit Sit to Stand: With upper extremity assist;With armrests;From chair/3-in-1;4: Min assist Stand to Sit: 4: Min assist;With upper extremity assist;With armrests;To chair/3-in-1 Details for Transfer Assistance: cues for hand placement, & tall posture with standing.    (A) to achieve standing, anterior translation of trunk over BOS, & controlled descent.  Ambulation/Gait Ambulation/Gait Assistance: 4: Min assist (pt's wife followed with recliner) Ambulation Distance (Feet): 40 Feet (10' + 30' ) Assistive device: Rolling walker Ambulation/Gait Assistance Details: cues for sequencing, & tall posture.  Seated rest break after ~10' then  able to amb another 30'.  Fatigues quickly.   Gait Pattern: Step-to pattern;Decreased weight shift to right;Decreased step length - left;Decreased step length - right;Trunk flexed Gait velocity: Decreased Stairs: No Wheelchair Mobility Wheelchair Mobility: No    Exercises Total Joint Exercises Ankle Circles/Pumps: AROM;Both;10 reps Long Arc Quad: AAROM;Right;10 reps;Seated General Exercises - Lower Extremity Hip Flexion/Marching: AAROM;Right;10 reps;Seated     PT Goals (current goals can now be found in the care plan section) Acute Rehab PT Goals PT Goal Formulation: With patient Time For Goal Achievement: 12/06/12 Potential to Achieve Goals: Fair  Visit Information  Last PT Received On: 11/30/12 Assistance Needed: +1 History of Present Illness: Pt is an 79 y/o male admitted for a total hip revision after multiple dislocations and infection.    Subjective Data      Cognition  Cognition Arousal/Alertness: Awake/alert Behavior During Therapy: WFL for tasks assessed/performed Overall Cognitive Status: Within Functional Limits for tasks assessed    Balance     End of Session PT - End of Session Equipment Utilized During Treatment: Gait belt Activity Tolerance: Patient tolerated treatment well;Patient limited by fatigue Patient left: in chair;with call bell/phone within reach;with family/visitor present Nurse Communication: Mobility status   GP     Lara Mulch 11/30/2012, 1:05 PM  Verdell Face, PTA [phone] 11/30/2012

## 2012-12-01 LAB — GLUCOSE, CAPILLARY
Glucose-Capillary: 99 mg/dL (ref 70–99)
Glucose-Capillary: 107 mg/dL — ABNORMAL HIGH (ref 70–99)

## 2012-12-01 LAB — CBC
Platelets: 175 10*3/uL (ref 150–400)
RBC: 2.41 MIL/uL — ABNORMAL LOW (ref 4.22–5.81)
WBC: 6 10*3/uL (ref 4.0–10.5)

## 2012-12-01 NOTE — Progress Notes (Signed)
Called MD re; prescriptions, per MD, pt has medications from before surgery.  MD also stated per pt he wanted to stay and work with PT another day, D/C order confirms.  SW to be advised.

## 2012-12-01 NOTE — Discharge Summary (Signed)
Patient ID: [name] MRN: [id number] DOB/AGE: [birth year] 77 y.o.  Admit date: 11/25/2012 Discharge date: 12/01/2012  Admission Diagnoses:  Principal Problem:   Hip dislocation, right   Discharge Diagnoses:  Same  Past Medical History  Diagnosis Date  . Atrial fibrillation   . Hypertension   . Pacemaker   . Diabetes mellitus without complication     Borderline  . Arthritis   . Anxiety   . Depression   . MSSA (methicillin susceptible Staphylococcus aureus) infection   . Bacteremia   . Infected pacemaker   . Septic hip     Surgeries: Procedure(s): TOTAL HIP REVISION With Placement of Contrain Liner on 11/25/2012 - 11/28/2012   Consultants:    Discharged Condition: Improved  Hospital Course: [name] is an 79 y.o. male who was admitted 11/25/2012 for operative treatment ofHip dislocation, right. Patient has severe unremitting pain that affects sleep, daily activities, and work/hobbies. After pre-op clearance the patient was taken to the operating room on 11/25/2012 - 11/28/2012 and underwent  Procedure(s): TOTAL HIP REVISION With Placement of Contrain Liner.    Patient was given perioperative antibiotics: Anti-infectives   Start     Dose/Rate Route Frequency Ordered Stop   11/28/12 1400  ceFAZolin (ANCEF) IVPB 2 g/50 mL premix  Status:  Discontinued     2 g 100 mL/hr over 30 Minutes Intravenous 3 times per day 11/28/12 1302 11/28/12 1808   11/28/12 1309  ceFAZolin (ANCEF) 2-3 GM-% IVPB SOLR    Comments:  [place]   : cabinet override      11/28/12 1309 11/28/12 1335   11/25/12 2200  cephALEXin (KEFLEX) capsule 500 mg    Comments:  Medication is given for periprosthetic infection right hip that was irrigated and debrided 6 weeks ago. Patient is also under the care of infectious disease consultant the   500 mg Oral 4 times daily 11/25/12 2010     11/25/12 2200  rifampin (RIFADIN) capsule 300 mg    Comments:  This medication is given for a  periprosthetic infection that occurred 6 weeks ago, and is under the direction of infectious diseases   300 mg Oral 2 times daily 11/25/12 2010         Patient was given sequential compression devices, early ambulation, and chemoprophylaxis to prevent DVT. We also continued him on his cephalexin and rifampin from his periprosthetic infection in August of 2014 and he will remain on this for a few more months. Prior to transfer to the nursing home the patient was and is awaiting easily with a rolling walker and we had taken him out of his brace.  Patient benefited maximally from hospital stay and there were no complications.    Recent vital signs: Patient Vitals for the past 24 hrs:  BP Temp Pulse Resp SpO2  12/01/12 0521 127/78 mmHg 98.5 F (36.9 C) 87 16 96 %  12/01/12 0400 - - - 16 99 %  12/01/12 0135 - 99.2 F (37.3 C) - - -  11/30/12 2334 - - - 16 99 %  11/30/12 2108 138/69 mmHg 100 F (37.8 C) 77 16 99 %  11/30/12 2000 - - - 16 100 %  11/30/12 1359 130/74 mmHg 98.7 F (37.1 C) 81 16 100 %  11/30/12 1009 132/75 mmHg - - - -  11/30/12 1008 132/75 mmHg - - - -     Recent laboratory studies:  Recent Labs  11/29/12 0538 11/30/12 0355 12/01/12 0530  WBC 7.0 6.6 6.0  HGB 8.3* 7.6* 7.5*  HCT 25.5* 23.1* 22.9*  PLT 190 173 175  NA 138  --   --   K 4.2  --   --   CL 104  --   --   CO2 28  --   --   BUN 11  --   --   CREATININE 1.01  --   --   GLUCOSE 153*  --   --   CALCIUM 8.4  --   --      Discharge Medications:     Medication List         aspirin 325 MG tablet  Take 1 tablet (325 mg total) by mouth daily.     bisacodyl 5 MG EC tablet  Commonly known as:  DULCOLAX  Take 5 mg by mouth daily as needed for constipation.     cephALEXin 500 MG capsule  Commonly known as:  KEFLEX  Take 1 capsule (500 mg total) by mouth 4 (four) times daily.     Coenzyme Q10 200 MG capsule  Take 200 mg by mouth daily.     diltiazem 120 MG 24 hr capsule  Commonly known as:   CARDIZEM CD  Take 1 capsule (120 mg total) by mouth daily.     DSS 100 MG Caps  Take 100 mg by mouth 2 (two) times daily.     escitalopram 10 MG tablet  Commonly known as:  LEXAPRO  Take 10 mg by mouth daily.     ferrous sulfate 325 (65 FE) MG tablet  Take 325 mg by mouth daily with breakfast.     finasteride 5 MG tablet  Commonly known as:  PROSCAR  Take 5 mg by mouth daily.     furosemide 40 MG tablet  Commonly known as:  LASIX  Take 40 mg by mouth daily.     HYDROcodone-acetaminophen 5-325 MG per tablet  Commonly known as:  NORCO/VICODIN  Take 1-2 tablets by mouth every 6 (six) hours as needed for pain.     insulin aspart 100 UNIT/ML injection  Commonly known as:  novoLOG  - Inject 2-12 Units into the skin 3 (three) times daily with meals. Sliding Scale Insulin  - 151-200=2 units; 201-250=4 units; 251-300=6 units; 301-350=8 units; 351-400=10 units; >400=12 units and call doctor     levothyroxine 88 MCG tablet  Commonly known as:  SYNTHROID, LEVOTHROID  Take 88 mcg by mouth daily before breakfast.     LORazepam 1 MG tablet  Commonly known as:  ATIVAN  Take 1 mg by mouth 2 (two) times daily as needed for anxiety.     lovastatin 20 MG tablet  Commonly known as:  MEVACOR  Take 40 mg by mouth at bedtime.     Melatonin 3 MG Tabs  Take 3 mg by mouth at bedtime.     metoprolol succinate 25 MG 24 hr tablet  Commonly known as:  TOPROL-XL  Take 12.5 mg by mouth daily.     pioglitazone 15 MG tablet  Commonly known as:  ACTOS  Take 15 mg by mouth daily.     polyethylene glycol packet  Commonly known as:  MIRALAX / GLYCOLAX  Take 17 g by mouth daily.     potassium chloride SA 20 MEQ tablet  Commonly known as:  K-DUR,KLOR-CON  Take 20 mEq by mouth daily.     rifampin 300 MG capsule  Commonly known as:  RIFADIN  Take 1 capsule (300 mg total) by mouth 2 (  two) times daily.     terazosin 5 MG capsule  Commonly known as:  HYTRIN  Take 5 mg by mouth at bedtime.      vitamin C 500 MG tablet  Commonly known as:  ASCORBIC ACID  Take 1,000 mg by mouth daily.        Diagnostic Studies: Dg Hip Complete Right  11/25/2012   CLINICAL DATA:  Status post right hip reduction  EXAM: RIGHT HIP - COMPLETE 2+ VIEW  COMPARISON:  Film from earlier in the same day.  FINDINGS: There is been reduction of the patient's right hip prosthesis. No acute abnormality is noted. No dislocation or fracture is seen.   Electronically Signed   By: Alcide Clever M.D.   On: 11/25/2012 18:47   Dg Hip Complete Right  11/25/2012   CLINICAL DATA:  Recent fall with right hip dislocation, now with recurrent right hip deformity  EXAM: RIGHT HIP - COMPLETE 2+ VIEW  COMPARISON:  11/23/2012  FINDINGS: Right total hip arthroplasty.  Recurrent posterior dislocation of the right femoral component.  No fracture is seen. Visualized bony pelvis appears intact.  Degenerative changes of the left hip and lower lumbar spine.  IMPRESSION: Right total hip arthroplasty.  Recurrent posterior dislocation of the right femoral component.   Electronically Signed   By: Charline Bills M.D.   On: 11/25/2012 15:45   Dg Hip Bilateral W/pelvis  11/23/2012   CLINICAL DATA:  Right hip pain.  EXAM: BILATERAL HIP WITH PELVIS - 4+ VIEW  COMPARISON:  10/21/2012.  FINDINGS: The right femoral prosthesis is dislocated posteriorly. No acute fracture.  IMPRESSION: Dislocation of the femoral prosthesis posteriorly.  No acute fracture.   Electronically Signed   By: Loralie Champagne M.D.   On: 11/23/2012 09:33   Dg Femur Right  11/23/2012   CLINICAL DATA:  Hip pain.  EXAM: RIGHT FEMUR - 2 VIEW  COMPARISON:  10/22/2012.  FINDINGS: The right femoral prosthetic head is dislocated.  No femur fracture.  IMPRESSION: Dislocation of the right femoral head prosthesis. No femur fracture.   Electronically Signed   By: Loralie Champagne M.D.   On: 11/23/2012 09:32   Dg Pelvis Portable  11/28/2012   CLINICAL DATA:  79 year old male status post right  total hip revision.  EXAM: PORTABLE PELVIS 1-2 VIEWS  COMPARISON:  11/23/2012 and earlier.  FINDINGS: Portable AP view at 1929 hrs. Revised right total hip arthroplasty hardware. Hardware components appear intact and normally aligned on this AP view. No unexpected osseous changes identified.  IMPRESSION: Right total hip revision with no adverse features identified.   Electronically Signed   By: Augusto Gamble M.D.   On: 11/28/2012 19:43   Dg Hip Portable 1 View Right  11/23/2012   CLINICAL DATA:  Close reduction right hip.  EXAM: PORTABLE RIGHT HIP - 1 VIEW  COMPARISON:  11/23/2012 and 11/26/2010  FINDINGS: Right hip prosthesis has been reduced. The distal component is not imaged on the current exam.  Bones are osteopenic without obvious acute fracture noted.  IMPRESSION: Right hip prosthesis has been reduced. The distal component is not imaged on the current exam.  Bones are osteopenic without obvious acute fracture noted.   Electronically Signed   By: Bridgett Larsson M.D.   On: 11/23/2012 14:12    Disposition: 01-Home or Self Care      Discharge Orders   Future Orders Complete By Expires   Call MD / Call 911  As directed    Comments:  If you experience chest pain or shortness of breath, CALL 911 and be transported to the hospital emergency room.  If you develope a fever above 101 F, pus (white drainage) or increased drainage or redness at the wound, or calf pain, call your surgeon's office.   Change dressing  As directed    Comments:     You may change your dressing on POD #5   Constipation Prevention  As directed    Comments:     Drink plenty of fluids.  Prune juice may be helpful.  You may use a stool softener, such as Colace (over the counter) 100 mg twice a day.  Use MiraLax (over the counter) for constipation as needed.   Diet - low sodium heart healthy  As directed    Driving restrictions  As directed    Comments:     No driving for 2 weeks   Follow the hip precautions as taught in  Physical Therapy  As directed    Increase activity slowly as tolerated  As directed    Patient may shower  As directed    Comments:     You may shower without a dressing once there is no drainage.  Do not wash over the wound.  If drainage remains, cover wound with plastic wrap and then shower.      Follow-up Information   Follow up with Nestor Lewandowsky, MD In 1 week.   Specialty:  Orthopedic Surgery   Contact information:   [address] [place] [address] [phone]        Signed: Nestor Lewandowsky 12/01/2012, 8:52 AM

## 2012-12-01 NOTE — Progress Notes (Signed)
Patient ID: [name], male   DOB: 03-28-1930, 79 y.o.   MRN: [id number] PATIENT ID: [name]  MRN: [id number]  DOB/AGE:  06-23-1930 / 79 y.o.  3 Days Post-Op Procedure(s) (LRB): TOTAL HIP REVISION With Placement of Contrain Liner (Right)    PROGRESS NOTE Subjective: Patient is alert, oriented,no Nausea, no Vomiting, yes passing gas, no Bowel Movement. Taking PO well. Denies SOB, Chest or Calf Pain. Using Incentive Spirometer, PAS in place. Ambulate WBAT Patient reports pain as mild , denies any specific weakness dizziness or nausea when he is ambulating he was able to walk 40 feet with oral and walker today. He would still like to go to [facility] in [place] for rehabilitation and thinks he'll be ready for this tomorrow. .    Objective: Vital signs in last 24 hours: Filed Vitals:   11/30/12 2334 12/01/12 0135 12/01/12 0400 12/01/12 0521  BP:    127/78  Pulse:    87  Temp:  99.2 F (37.3 C)  98.5 F (36.9 C)  TempSrc:      Resp: 16  16 16   Height:      Weight:      SpO2: 99%  99% 96%      Intake/Output from previous day: I/O last 3 completed shifts: In: 4295 [P.O.:720; I.V.:3575] Out: 2650 [Urine:2650]   Intake/Output this shift: Total I/O In: 360 [P.O.:360] Out: 300 [Urine:300]   LABORATORY DATA:  Recent Labs  11/29/12 0538  11/30/12 0355  11/30/12 1614 11/30/12 2111 12/01/12 0530 12/01/12 0641  WBC 7.0  --  6.6  --   --   --  6.0  --   HGB 8.3*  --  7.6*  --   --   --  7.5*  --   HCT 25.5*  --  23.1*  --   --   --  22.9*  --   PLT 190  --  173  --   --   --  175  --   NA 138  --   --   --   --   --   --   --   K 4.2  --   --   --   --   --   --   --   CL 104  --   --   --   --   --   --   --   CO2 28  --   --   --   --   --   --   --   BUN 11  --   --   --   --   --   --   --   CREATININE 1.01  --   --   --   --   --   --   --   GLUCOSE 153*  --   --   --   --   --   --   --   GLUCAP  --   < >  --   < > 103* 112*  --  99  CALCIUM 8.4  --   --    --   --   --   --   --   < > = values in this interval not displayed.  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  Plan: PT/OT WBAT, THA  posterior precautions continue by mouth antibiotics the patient was on 4 periprosthetic infection when he was admitted 6 days ago.  DVT Prophylaxis: SCDx72 hrs, ASA 325 mg BID x 2 weeks  DISCHARGE PLAN: Skilled Nursing Facility/Rehab, patient prefers [name] in [place] and thinks that he will be ready for this tomorrow.  DISCHARGE NEEDS: HHPT, HHRN, Walker and 3-in-1 comode seat

## 2012-12-01 NOTE — Progress Notes (Signed)
CSW (Clinical Child psychotherapist) aware that pt is ready for dc today. CSW has sent dc summary to facility. Facility is still awaiting insurance authorization and pt wife still needs to fill out paperwork. Facility to call CSW when everything is ready for pt to dc to their facility.  [name], LCSWA [phone]

## 2012-12-01 NOTE — Progress Notes (Signed)
CSW (Clinical Child psychotherapist) informed pt will be ready for [employer]. CSW has informed facility.   Shambhavi Salley, LCSWA [phone]

## 2012-12-01 NOTE — Progress Notes (Signed)
Physical Therapy Treatment Patient Details Name: [name] MRN: [id number] DOB: 1930/10/08 Today's Date: 12/01/2012 Time: 0865-7846 PT Time Calculation (min): 35 min  PT Assessment / Plan / Recommendation  History of Present Illness Pt is an 79 y/o male admitted for a total hip revision after multiple dislocations and infection.   PT Comments   Pt making progress towards functional goals. Cramp in L thigh/hip during gait training caused unexpected buckling of knee and LOB, requiring max assist to safely lower pt to chair. Close chair follow strongly recommended for further gait training. Pt reports he is to d/c to SNF tomorrow.  Follow Up Recommendations  SNF     Does the patient have the potential to tolerate intense rehabilitation     Barriers to Discharge        Equipment Recommendations  None recommended by PT    Recommendations for Other Services    Frequency Min 6X/week   Progress towards PT Goals Progress towards PT goals: Progressing toward goals  Plan Current plan remains appropriate    Precautions / Restrictions Precautions Precautions: Posterior Hip;Fall Precaution Comments: reviewed hip precautions Restrictions Weight Bearing Restrictions: No RLE Weight Bearing: Weight bearing as tolerated   Pertinent Vitals/Pain Patient reports no pain at beginning or end of session.    Mobility  Bed Mobility Bed Mobility: Not assessed Details for Bed Mobility Assistance: Pt received up in chair Transfers Transfers: Sit to Stand;Stand to Sit Sit to Stand: 3: Mod assist;From chair/3-in-1;With upper extremity assist Sit to Stand: Patient Percentage: 50% Stand to Sit: 4: Min assist;To chair/3-in-1;With upper extremity assist Stand to Sit: Patient Percentage: 50% Details for Transfer Assistance: VC's for hand placement and controlled descent to chair Ambulation/Gait Ambulation/Gait Assistance: 4: Min guard;2: Max assist Ambulation Distance (Feet): 50 Feet Assistive  device: Rolling walker Ambulation/Gait Assistance Details: Min guard for gait with one instance of max assist needed to control descent to chair. Pt got cramp in R thigh/hip and it gave out on him causing a LOB. Gait Pattern: Step-to pattern;Decreased stride length;Narrow base of support;Trunk flexed Gait velocity: Decreased General Gait Details: Needs chair follow Stairs: No    Exercises Total Joint Exercises Ankle Circles/Pumps: 10 reps Quad Sets: 10 reps Heel Slides: 10 reps Long Arc Quad: 10 reps;AAROM   PT Diagnosis:    PT Problem List:   PT Treatment Interventions:     PT Goals (current goals can now be found in the care plan section) Acute Rehab PT Goals Patient Stated Goal: To return home with wife after rehab PT Goal Formulation: With patient Time For Goal Achievement: 12/06/12 Potential to Achieve Goals: Fair  Visit Information  Last PT Received On: 12/01/12 Assistance Needed: +2 (chair follow) History of Present Illness: Pt is an 79 y/o male admitted for a total hip revision after multiple dislocations and infection.    Subjective Data  Subjective: "I just get a cramp sometimes." Patient Stated Goal: To return home with wife after rehab   Cognition  Cognition Arousal/Alertness: Awake/alert Behavior During Therapy: WFL for tasks assessed/performed Overall Cognitive Status: Within Functional Limits for tasks assessed    Balance  Balance Balance Assessed: Yes Static Sitting Balance Static Sitting - Balance Support: Feet supported;Bilateral upper extremity supported Static Sitting - Level of Assistance: 5: Stand by assistance Static Sitting - Comment/# of Minutes: 8 Static Standing Balance Static Standing - Balance Support: Bilateral upper extremity supported Static Standing - Level of Assistance: 4: Min assist  End of Session PT - End of Session  Equipment Utilized During Treatment: Gait belt Activity Tolerance: Patient limited by fatigue Patient left: in  chair;with call bell/phone within reach;with family/visitor present Nurse Communication: Mobility status   GP     Ruthann Cancer 12/01/2012, 4:12 PM  Ruthann Cancer, PT, DPT [phone]

## 2012-12-02 LAB — TISSUE CULTURE
Culture: NO GROWTH
Culture: NO GROWTH

## 2012-12-02 LAB — GLUCOSE, CAPILLARY
Glucose-Capillary: 146 mg/dL — ABNORMAL HIGH (ref 70–99)
Glucose-Capillary: 173 mg/dL — ABNORMAL HIGH (ref 70–99)

## 2012-12-02 MED ORDER — HYDROCODONE-ACETAMINOPHEN 5-325 MG PO TABS: 1.0000 | ORAL_TABLET | ORAL | Status: DC | PRN

## 2012-12-02 NOTE — Progress Notes (Signed)
CSW (Clinical Child psychotherapist) prepared pt dc packet and placed with shadow chart. CSW called for transport. Pt informed and message left for facility. Pt nurse aware. CSW signing off  Sharol Harness, [place] [phone]

## 2012-12-02 NOTE — Progress Notes (Signed)
PATIENT ID: [name]  MRN: [id number]  DOB/AGE:  77-Mar-1932 / 77 y.o.  4 Days Post-Op Procedure(s) (LRB): TOTAL HIP REVISION With Placement of Contrain Liner (Right)    PROGRESS NOTE Subjective: Patient is alert, oriented,no Nausea, no Vomiting, yes passing gas, yes Bowel Movement. Taking PO well. Denies SOB, Chest or Calf Pain. Using Incentive Spirometer, PAS in place. Ambulate WBAT Patient reports pain as mild  .    Objective: Vital signs in last 24 hours: Filed Vitals:   12/01/12 2100 12/02/12 0000 12/02/12 0358 12/02/12 0537  BP: 151/85   113/81  Pulse: 96   74  Temp: 100.1 F (37.8 C) 98.6 F (37 C)  98.8 F (37.1 C)  TempSrc:  Oral    Resp: 18 18 18 18   Height:      Weight:      SpO2: 98% 98% 97% 98%      Intake/Output from previous day: I/O last 3 completed shifts: In: 1290 [P.O.:840; I.V.:450] Out: 2790 [Urine:2790]   Intake/Output this shift:     LABORATORY DATA:  Recent Labs  11/30/12 0355  12/01/12 0530  12/01/12 1610 12/01/12 2326 12/02/12 0636  WBC 6.6  --  6.0  --   --   --   --   HGB 7.6*  --  7.5*  --   --   --   --   HCT 23.1*  --  22.9*  --   --   --   --   PLT 173  --  175  --   --   --   --   GLUCAP  --   < >  --   < > 169* 107* 107*  < > = values in this interval not displayed.  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  Plan: PT/OT WBAT, THA  posterior precautions  DVT Prophylaxis: SCDx72 hrs, ASA 325 mg  BID x 2 weeks  DISCHARGE PLAN: Skilled Nursing Facility/Rehab, [name] today.  DISCHARGE NEEDS: HHPT, HHRN, Walker and 3-in-1 comode seat

## 2012-12-03 LAB — ANAEROBIC CULTURE

## 2012-12-05 LAB — POCT I-STAT 4, (NA,K, GLUC, HGB,HCT)
Glucose, Bld: 79 mg/dL (ref 70–99)
Hemoglobin: 7.8 g/dL — ABNORMAL LOW (ref 13.0–17.0)
Potassium: 3.4 mEq/L — ABNORMAL LOW (ref 3.5–5.1)

## 2012-12-30 ENCOUNTER — Encounter (HOSPITAL_BASED_OUTPATIENT_CLINIC_OR_DEPARTMENT_OTHER): Payer: Self-pay | Admitting: Emergency Medicine

## 2012-12-30 ENCOUNTER — Emergency Department (HOSPITAL_BASED_OUTPATIENT_CLINIC_OR_DEPARTMENT_OTHER)
Admission: EM | Admit: 2012-12-30 | Discharge: 2012-12-30 | Disposition: A | Discharge status: Home / Self Care | Payer: Medicare Other | Attending: Emergency Medicine | Admitting: Emergency Medicine

## 2012-12-30 DIAGNOSIS — R350 Frequency of micturition: Secondary | ICD-10-CM | POA: Insufficient documentation

## 2012-12-30 DIAGNOSIS — Z794 Long term (current) use of insulin: Secondary | ICD-10-CM | POA: Insufficient documentation

## 2012-12-30 DIAGNOSIS — M129 Arthropathy, unspecified: Secondary | ICD-10-CM | POA: Insufficient documentation

## 2012-12-30 DIAGNOSIS — F411 Generalized anxiety disorder: Secondary | ICD-10-CM | POA: Insufficient documentation

## 2012-12-30 DIAGNOSIS — Z8614 Personal history of Methicillin resistant Staphylococcus aureus infection: Secondary | ICD-10-CM | POA: Insufficient documentation

## 2012-12-30 DIAGNOSIS — Z792 Long term (current) use of antibiotics: Secondary | ICD-10-CM | POA: Insufficient documentation

## 2012-12-30 DIAGNOSIS — I4891 Unspecified atrial fibrillation: Secondary | ICD-10-CM | POA: Insufficient documentation

## 2012-12-30 DIAGNOSIS — F329 Major depressive disorder, single episode, unspecified: Secondary | ICD-10-CM | POA: Insufficient documentation

## 2012-12-30 DIAGNOSIS — Z96649 Presence of unspecified artificial hip joint: Secondary | ICD-10-CM | POA: Insufficient documentation

## 2012-12-30 DIAGNOSIS — Z79899 Other long term (current) drug therapy: Secondary | ICD-10-CM | POA: Insufficient documentation

## 2012-12-30 DIAGNOSIS — I10 Essential (primary) hypertension: Secondary | ICD-10-CM | POA: Insufficient documentation

## 2012-12-30 DIAGNOSIS — Z8781 Personal history of (healed) traumatic fracture: Secondary | ICD-10-CM | POA: Insufficient documentation

## 2012-12-30 DIAGNOSIS — R634 Abnormal weight loss: Secondary | ICD-10-CM | POA: Insufficient documentation

## 2012-12-30 DIAGNOSIS — Z8739 Personal history of other diseases of the musculoskeletal system and connective tissue: Secondary | ICD-10-CM | POA: Insufficient documentation

## 2012-12-30 DIAGNOSIS — Z7982 Long term (current) use of aspirin: Secondary | ICD-10-CM | POA: Insufficient documentation

## 2012-12-30 DIAGNOSIS — E876 Hypokalemia: Secondary | ICD-10-CM | POA: Insufficient documentation

## 2012-12-30 DIAGNOSIS — Z950 Presence of cardiac pacemaker: Secondary | ICD-10-CM | POA: Insufficient documentation

## 2012-12-30 DIAGNOSIS — F3289 Other specified depressive episodes: Secondary | ICD-10-CM | POA: Insufficient documentation

## 2012-12-30 LAB — CBC WITH DIFFERENTIAL/PLATELET
Eosinophils Absolute: 0.2 10*3/uL (ref 0.0–0.7)
Eosinophils Relative: 2 % (ref 0–5)
HCT: 38.7 % — ABNORMAL LOW (ref 39.0–52.0)
Hemoglobin: 12.4 g/dL — ABNORMAL LOW (ref 13.0–17.0)
Lymphs Abs: 1.4 10*3/uL (ref 0.7–4.0)
MCH: 30.5 pg (ref 26.0–34.0)
MCV: 95.3 fL (ref 78.0–100.0)
Monocytes Absolute: 1.2 10*3/uL — ABNORMAL HIGH (ref 0.1–1.0)
Monocytes Relative: 15 % — ABNORMAL HIGH (ref 3–12)
Neutro Abs: 5 10*3/uL (ref 1.7–7.7)
Platelets: 218 10*3/uL (ref 150–400)
RBC: 4.06 MIL/uL — ABNORMAL LOW (ref 4.22–5.81)
RDW: 15.4 % (ref 11.5–15.5)

## 2012-12-30 LAB — URINE MICROSCOPIC-ADD ON

## 2012-12-30 LAB — COMPREHENSIVE METABOLIC PANEL
ALT: 104 U/L — ABNORMAL HIGH (ref 0–53)
BUN: 27 mg/dL — ABNORMAL HIGH (ref 6–23)
CO2: 31 mEq/L (ref 19–32)
Calcium: 9.9 mg/dL (ref 8.4–10.5)
Creatinine, Ser: 1.3 mg/dL (ref 0.50–1.35)
GFR calc Af Amer: 58 mL/min — ABNORMAL LOW (ref >90)
GFR calc non Af Amer: 50 mL/min — ABNORMAL LOW (ref >90)
Glucose, Bld: 103 mg/dL — ABNORMAL HIGH (ref 70–99)
Potassium: 2.4 mEq/L — CL (ref 3.5–5.1)
Sodium: 140 mEq/L (ref 135–145)
Total Protein: 7.8 g/dL (ref 6.0–8.3)

## 2012-12-30 LAB — URINALYSIS, ROUTINE W REFLEX MICROSCOPIC
Nitrite: NEGATIVE
Specific Gravity, Urine: 1.011 (ref 1.005–1.030)
Urobilinogen, UA: 0.2 mg/dL (ref 0.0–1.0)
pH: 6 (ref 5.0–8.0)

## 2012-12-30 MED ORDER — POTASSIUM CHLORIDE CRYS ER 20 MEQ PO TBCR: 40.0000 meq | EXTENDED_RELEASE_TABLET | Freq: Once | ORAL | Status: DC

## 2012-12-30 MED ORDER — POTASSIUM CHLORIDE CRYS ER 20 MEQ PO TBCR: 20.0000 meq | EXTENDED_RELEASE_TABLET | Freq: Every day | ORAL | Status: DC

## 2012-12-30 MED ORDER — POTASSIUM CHLORIDE CRYS ER 20 MEQ PO TBCR
80.0000 meq | EXTENDED_RELEASE_TABLET | Freq: Once | ORAL | Status: AC
  Administered 2012-12-30: 80 meq via ORAL
  Filled 2012-12-30: qty 4

## 2012-12-30 NOTE — ED Provider Notes (Signed)
CSN: [id number]     Arrival date & time 12/30/12  1815 History  This chart was scribed for Rolan Bucco, MD by Carl Best, ED Scribe. This patient was seen in room MH02/MH02 and the patient's care was started at 6:59 PM.     Chief Complaint  Patient presents with  . Weakness    Patient is a 79 y.o. male presenting with weakness. The history is provided by the patient. No language interpreter was used.  Weakness Pertinent negatives include no chest pain, no abdominal pain, no headaches and no shortness of breath.   HPI Comments: [name] is a 79 y.o. male with a history of DM, Hypertension, and Atrial Fibrillation who presents to the Emergency Department with increased urination.  He states that he urinated approximately 2 L yesterday and 1 L today.  He denies chest pain, leg swelling, weakness, appetite or energy level changes as associated symptoms.  He states that he has lost 17 pounds since being discharged from [facility] last week.    The patient states that he was admitted to [facility] after he fractured his hip and was discharged last week.  He states that upon discharge his Lasix and Metolazone medication dosages were increased because the physicians noticed edema in his legs bilaterally.  He states that he normally took 2.5 mg of Metolazone Monday, Wednesday, and Friday but was told to start taking 5 mg tablet daily.  He states that prior to arriving to [name] he was taking  40 mg tablet of Lasix once a day but was told to start taking  40 mg tablet of Lasix twice a day.  He states that a nurse from [facility] came to check on him today and was concerned about his marked urination and felt that he needed to have bloodwork done.  He states that today he only took 1 40 mg tablet of Lasix.  He states that he has been taking Lasix for 25 years.  He was on coumadin, but was taken off during this last hospitilization.  He  states that he also takes 325 mg tablets of Aspirin daily and Potassium tablets.  The patient states that his medication is managed by his cardiologist in [place].  He states that his PCP is Dr. Derrell Lolling.    He states that his pacemaker was removed at [facility] at the time of his hip surgery due to a staph infection he acquired.  He states that the pacemaker was placed due to his atrial fibrillation.    Past Medical History  Diagnosis Date  . Atrial fibrillation   . Hypertension   . Pacemaker   . Diabetes mellitus without complication     Borderline  . Arthritis   . Anxiety   . Depression   . MSSA (methicillin susceptible Staphylococcus aureus) infection   . Bacteremia   . Infected pacemaker   . Septic hip    Past Surgical History  Procedure Laterality Date  . Pacemaker insertion    . Lumbar laminectomy/decompression microdiscectomy  02/14/2012    Procedure: LUMBAR LAMINECTOMY/DECOMPRESSION MICRODISCECTOMY 1 LEVEL;  Surgeon: Tia Alert, MD;  Location: [facility] NEURO ORS;  Service: Neurosurgery;  Laterality: N/A;  Thoracic twelve - Lumbar one decompressive laminectomy.  Rhae Hammock without cardioversion N/A 08/30/2012    Procedure: TRANSESOPHAGEAL ECHOCARDIOGRAM (TEE);  Surgeon: Laurey Morale, MD;  Location: [facility] ENDOSCOPY;  Service: Cardiovascular;  Laterality: N/A;  . Icd lead removal Left 08/31/2012  Procedure: ICD LEAD REMOVAL;  Surgeon: Marinus Maw, MD;  Location: [facility] OR;  Service: Cardiovascular;  Laterality: Left;  . Incision and drainage hip Right 09/02/2012    Procedure: IRRIGATION AND DEBRIDEMENT RIGHT HIP;  Surgeon: Nestor Lewandowsky, MD;  Location: [facility] OR;  Service: Orthopedics;  Laterality: Right;  . Total hip revision Right 09/02/2012    Procedure: REVISION OF POLYETHYLENE LINER AND FEMORAL HEAD ;  Surgeon: Nestor Lewandowsky, MD;  Location: [facility] OR;  Service: Orthopedics;  Laterality: Right;  . Hip closed reduction Right 10/02/2012    Procedure: CLOSED REDUCTION HIP;  Surgeon: Velna Ochs, MD;   Location: [facility] OR;  Service: Orthopedics;  Laterality: Right;  . Total hip revision Right 11/28/2012    Procedure: TOTAL HIP REVISION With Placement of Contrain Liner;  Surgeon: Nestor Lewandowsky, MD;  Location: [facility] OR;  Service: Orthopedics;  Laterality: Right;   No family history on file. History  Substance Use Topics  . Smoking status: Never Smoker   . Smokeless tobacco: Never Used  . Alcohol Use: No    Review of Systems  Constitutional: Positive for unexpected weight change. Negative for fever, chills, diaphoresis, activity change, appetite change and fatigue.  HENT: Negative for congestion, rhinorrhea and sneezing.   Eyes: Negative.   Respiratory: Negative for cough, chest tightness and shortness of breath.   Cardiovascular: Negative for chest pain and leg swelling.  Gastrointestinal: Negative for nausea, vomiting, abdominal pain, diarrhea and blood in stool.  Genitourinary: Positive for frequency. Negative for hematuria, flank pain and difficulty urinating.  Musculoskeletal: Negative for arthralgias and back pain.  Skin: Negative for rash.  Neurological: Negative for dizziness, speech difficulty, weakness, numbness and headaches.  All other systems reviewed and are negative.    Allergies  Ambien  Home Medications   Current Outpatient Rx  Name  Route  Sig  Dispense  Refill  . aspirin 325 MG tablet   Oral   Take 1 tablet (325 mg total) by mouth daily.   90 tablet   0   . bisacodyl (DULCOLAX) 5 MG EC tablet   Oral   Take 5 mg by mouth daily as needed for constipation.         . cephALEXin (KEFLEX) 500 MG capsule   Oral   Take 1 capsule (500 mg total) by mouth 4 (four) times daily.   120 capsule   11   . Coenzyme Q10 200 MG capsule   Oral   Take 200 mg by mouth daily.         Marland Kitchen diltiazem (CARDIZEM CD) 120 MG 24 hr capsule   Oral   Take 1 capsule (120 mg total) by mouth daily.   90 capsule   0   . docusate sodium 100 MG CAPS   Oral   Take 100 mg by mouth 2  (two) times daily.   10 capsule   0   . escitalopram (LEXAPRO) 10 MG tablet   Oral   Take 10 mg by mouth daily.          . ferrous sulfate 325 (65 FE) MG tablet   Oral   Take 325 mg by mouth daily with breakfast.         . finasteride (PROSCAR) 5 MG tablet   Oral   Take 5 mg by mouth daily.           . furosemide (LASIX) 40 MG tablet   Oral   Take 40 mg by mouth  daily.           Marland Kitchen HYDROcodone-acetaminophen (NORCO/VICODIN) 5-325 MG per tablet   Oral   Take 1-2 tablets by mouth every 6 (six) hours as needed for pain.   20 tablet   0   . HYDROcodone-acetaminophen (NORCO/VICODIN) 5-325 MG per tablet   Oral   Take 1-2 tablets by mouth every 4 (four) hours as needed for moderate pain.   60 tablet   0   . insulin aspart (NOVOLOG) 100 UNIT/ML injection   Subcutaneous   Inject 2-12 Units into the skin 3 (three) times daily with meals. Sliding Scale Insulin 151-200=2 units; 201-250=4 units; 251-300=6 units; 301-350=8 units; 351-400=10 units; >400=12 units and call doctor         . levothyroxine (SYNTHROID, LEVOTHROID) 88 MCG tablet   Oral   Take 88 mcg by mouth daily before breakfast.         . LORazepam (ATIVAN) 1 MG tablet   Oral   Take 1 mg by mouth 2 (two) times daily as needed for anxiety.          . lovastatin (MEVACOR) 20 MG tablet   Oral   Take 40 mg by mouth at bedtime.          . Melatonin 3 MG TABS   Oral   Take 3 mg by mouth at bedtime.         . metoprolol succinate (TOPROL-XL) 25 MG 24 hr tablet   Oral   Take 12.5 mg by mouth daily.         . pioglitazone (ACTOS) 15 MG tablet   Oral   Take 15 mg by mouth daily.          . polyethylene glycol (MIRALAX / GLYCOLAX) packet   Oral   Take 17 g by mouth daily.         . potassium chloride SA (K-DUR,KLOR-CON) 20 MEQ tablet   Oral   Take 20 mEq by mouth daily.         . potassium chloride SA (K-DUR,KLOR-CON) 20 MEQ tablet   Oral   Take 1 tablet (20 mEq total) by mouth  daily.   3 tablet   0   . rifampin (RIFADIN) 300 MG capsule   Oral   Take 1 capsule (300 mg total) by mouth 2 (two) times daily.   60 capsule   11   . terazosin (HYTRIN) 5 MG capsule   Oral   Take 5 mg by mouth at bedtime.          . vitamin C (ASCORBIC ACID) 500 MG tablet   Oral   Take 1,000 mg by mouth daily.          Triage Vitals: BP 122/72  Pulse 81  Temp(Src) 97.6 F (36.4 C) (Oral)  Resp 20  Ht 5\' 11"  (1.803 m)  Wt 197 lb (89.359 kg)  BMI 27.49 kg/m2  SpO2 100%  Physical Exam  Constitutional: He is oriented to person, place, and time. He appears well-developed and well-nourished.  HENT:  Head: Normocephalic and atraumatic.  Eyes: Pupils are equal, round, and reactive to light.  Neck: Normal range of motion. Neck supple.  Cardiovascular: Normal rate, regular rhythm and normal heart sounds.   Pulmonary/Chest: Effort normal and breath sounds normal. No respiratory distress. He has no wheezes. He has no rales. He exhibits no tenderness.  Abdominal: Soft. Bowel sounds are normal. There is no tenderness. There is no rebound and no guarding.  Musculoskeletal: Normal range of motion. He exhibits no edema.  Lymphadenopathy:    He has no cervical adenopathy.  Neurological: He is alert and oriented to person, place, and time.  Skin: Skin is warm and dry. No rash noted.  Psychiatric: He has a normal mood and affect.    ED Course  Procedures (including critical care time)  DIAGNOSTIC STUDIES: Oxygen Saturation is 100% on room air, normal by my interpretation.    COORDINATION OF CARE: 7:09 PM- Discussed checking the patient's blood work.  The patient agreed to the treatment plan.    Labs Review Results for orders placed during the hospital encounter of 12/30/12  CBC WITH DIFFERENTIAL      Result Value Range   WBC 7.8  4.0 - 10.5 K/uL   RBC 4.06 (*) 4.22 - 5.81 MIL/uL   Hemoglobin 12.4 (*) 13.0 - 17.0 g/dL   HCT 45.4 (*) 09.8 - 11.9 %   MCV 95.3  78.0 -  100.0 fL   MCH 30.5  26.0 - 34.0 pg   MCHC 32.0  30.0 - 36.0 g/dL   RDW 14.7  82.9 - 56.2 %   Platelets 218  150 - 400 K/uL   Neutrophils Relative % 65  43 - 77 %   Neutro Abs 5.0  1.7 - 7.7 K/uL   Lymphocytes Relative 18  12 - 46 %   Lymphs Abs 1.4  0.7 - 4.0 K/uL   Monocytes Relative 15 (*) 3 - 12 %   Monocytes Absolute 1.2 (*) 0.1 - 1.0 K/uL   Eosinophils Relative 2  0 - 5 %   Eosinophils Absolute 0.2  0.0 - 0.7 K/uL   Basophils Relative 1  0 - 1 %   Basophils Absolute 0.1  0.0 - 0.1 K/uL  COMPREHENSIVE METABOLIC PANEL      Result Value Range   Sodium 140  135 - 145 mEq/L   Potassium 2.4 (*) 3.5 - 5.1 mEq/L   Chloride 94 (*) 96 - 112 mEq/L   CO2 31  19 - 32 mEq/L   Glucose, Bld 103 (*) 70 - 99 mg/dL   BUN 27 (*) 6 - 23 mg/dL   Creatinine, Ser 1.30  0.50 - 1.35 mg/dL   Calcium 9.9  8.4 - 86.5 mg/dL   Total Protein 7.8  6.0 - 8.3 g/dL   Albumin 3.6  3.5 - 5.2 g/dL   AST 84 (*) 0 - 37 U/L   ALT 104 (*) 0 - 53 U/L   Alkaline Phosphatase 95  39 - 117 U/L   Total Bilirubin 0.6  0.3 - 1.2 mg/dL   GFR calc non Af Amer 50 (*) >90 mL/min   GFR calc Af Amer 58 (*) >90 mL/min  URINALYSIS, ROUTINE W REFLEX MICROSCOPIC      Result Value Range   Color, Urine YELLOW  YELLOW   APPearance CLEAR  CLEAR   Specific Gravity, Urine 1.011  1.005 - 1.030   pH 6.0  5.0 - 8.0   Glucose, UA NEGATIVE  NEGATIVE mg/dL   Hgb urine dipstick NEGATIVE  NEGATIVE   Bilirubin Urine NEGATIVE  NEGATIVE   Ketones, ur NEGATIVE  NEGATIVE mg/dL   Protein, ur NEGATIVE  NEGATIVE mg/dL   Urobilinogen, UA 0.2  0.0 - 1.0 mg/dL   Nitrite NEGATIVE  NEGATIVE   Leukocytes, UA TRACE (*) NEGATIVE  URINE MICROSCOPIC-ADD ON      Result Value Range   Squamous Epithelial / LPF RARE  RARE  WBC, UA 3-6  <3 WBC/hpf   RBC / HPF 0-2  <3 RBC/hpf   Bacteria, UA RARE  RARE   Urine-Other MUCOUS PRESENT     No results found.  Imaging Review No results found.  EKG Interpretation    Date/Time:  Friday December 30 2012  19:01:09 EST Ventricular Rate:  87 PR Interval:    QRS Duration: 112 QT Interval:  428 QTC Calculation: 515 R Axis:   -39 Text Interpretation:  Undetermined rhythm , likely a-fib Left axis deviation Low voltage QRS Cannot rule out Anterior infarct , age undetermined Prolonged QT Abnormal ECG similar to prior EKG Confirmed by Govind Furey  MD, Domenico Achord ([id number]) on 12/30/2012 8:55:22 PM            MDM   1. Hypokalemia    Patient was seen here for excessive diuresis. His potassium is low and was given potassium in the ED as well as a prescription for potassium for the next 3 days. He's otherwise asymptomatic. He says he feels great. He denies excessive weakness or dizziness. He denies any chest pain or shortness of breath. He was discharged home in good condition. He has an appointment to followup with his primary care physician on Monday. Over the weekend he's going to back to his usual dose of Lasix and metaxalone and discuss with his primary care physician on Monday how to proceed after this.  I personally performed the services described in this documentation, which was scribed in my presence.  The recorded information has been reviewed and considered.     Rolan Bucco, MD 12/30/12 2055

## 2012-12-30 NOTE — ED Notes (Signed)
Here with weakness. Potassium was 4.0. Alert no pain.

## 2012-12-30 NOTE — ED Notes (Signed)
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  States recently his diuretic has been adjusted and he has been voiding excessively.  He was told by home health nurse today that Potassium was elvated and told to come to ED for evaluation.  Pt denies any complaints.

## 2013-02-16 ENCOUNTER — Encounter: Payer: Self-pay | Admitting: Infectious Disease

## 2013-02-16 ENCOUNTER — Ambulatory Visit (INDEPENDENT_AMBULATORY_CARE_PROVIDER_SITE_OTHER): Payer: Medicare Other | Admitting: Infectious Disease

## 2013-02-16 VITALS — BP 102/62 | HR 71 | Temp 97.8°F

## 2013-02-16 DIAGNOSIS — Z9181 History of falling: Secondary | ICD-10-CM

## 2013-02-16 DIAGNOSIS — S0993XA Unspecified injury of face, initial encounter: Secondary | ICD-10-CM

## 2013-02-16 DIAGNOSIS — R7881 Bacteremia: Secondary | ICD-10-CM

## 2013-02-16 DIAGNOSIS — T8450XA Infection and inflammatory reaction due to unspecified internal joint prosthesis, initial encounter: Secondary | ICD-10-CM

## 2013-02-16 DIAGNOSIS — A4901 Methicillin susceptible Staphylococcus aureus infection, unspecified site: Secondary | ICD-10-CM

## 2013-02-16 DIAGNOSIS — Z96649 Presence of unspecified artificial hip joint: Secondary | ICD-10-CM

## 2013-02-16 DIAGNOSIS — T827XXA Infection and inflammatory reaction due to other cardiac and vascular devices, implants and grafts, initial encounter: Secondary | ICD-10-CM

## 2013-02-16 DIAGNOSIS — R296 Repeated falls: Secondary | ICD-10-CM

## 2013-02-16 DIAGNOSIS — B9561 Methicillin susceptible Staphylococcus aureus infection as the cause of diseases classified elsewhere: Secondary | ICD-10-CM

## 2013-02-16 DIAGNOSIS — S199XXA Unspecified injury of neck, initial encounter: Secondary | ICD-10-CM

## 2013-02-16 DIAGNOSIS — T8459XA Infection and inflammatory reaction due to other internal joint prosthesis, initial encounter: Secondary | ICD-10-CM

## 2013-02-16 DIAGNOSIS — K6819 Other retroperitoneal abscess: Secondary | ICD-10-CM

## 2013-02-16 NOTE — Progress Notes (Signed)
Subjective:    Patient ID: [name], male    DOB: 1930/02/23, 80 y.o.   MRN: [id number]  HPI  80 y.o. male with Pacemaker, Right prosthetic hip, Laminectomy in January of 2014 admitted in August 2014 with right leg pain weakness fevers. He has been found to have MSSA bacteremia and a large retroperitoneal abscess that is adjacent to his prosthetic hip.  Orthopedics performed resection of Radical irrigation and debridement right total hip with removal of all synovium, revision of femoral head, revision of polyethylene liner placement On 09/02/12  TEE failed to show vegetations on valves or overtly on PM. PM is out. He returned to the hospital with dislocation of his hip. This was able to be reduced without surgical intervention. My partner Dr. Ninetta Lights saw the patient and noted his sedimentation rate and C-reactive protein is still be quite high. Patient completed a course of  8 weeks of cefazolin 2 g IV every 8 hours at the skilled nursing facility along with  oral rifampin twice daily.   I changed him to oral Keflex when his IV antibiotics were stopped and he continued on high-dose Keflex with rifampin since then.   Unfortunately he suffered a fall at home this past week when he attempted to get up onto a scale to weigh himself and slipped. His right hip was again dislocated. It was placed back into position in the emergency room and under conscious sedation.   He was readmitted in late October and underwent  Irrigation and debridement of infected right total hip with removal of all synovium, revision of femoral head to a +5 40 mm metal head, revision of polyethylene liner to a constrained liner  He has remained on keflex 500mg  po tid since then but stopped the rifampin  His hip pain has dissapeared but he has now pain in the right leg itself and difficulty raising leg vs gravity though he can bear weight on it.  Earlier this week he fell backwards and struck his head because he tried to  step on a scale and fell backwards hit wall and slid down. He saw Dr. Turner Daniels who evaluted neck with plain films. HE is taking muscle relaxants and narcotics but pain not well controlled. THey reequested if I might be able to have him receive a shot or change pain meds but I thought this would be a very bad idea.     Review of Systems  Constitutional: Negative for chills, diaphoresis, activity change, appetite change, fatigue and unexpected weight change.  HENT: Negative for sore throat.   Eyes: Negative for visual disturbance.  Respiratory: Negative for cough and chest tightness.   Gastrointestinal: Negative for nausea, vomiting, abdominal pain, diarrhea, constipation, blood in stool, abdominal distention and anal bleeding.  Genitourinary: Negative for dysuria, hematuria, flank pain and difficulty urinating.  Musculoskeletal: Positive for arthralgias, gait problem, myalgias, neck pain and neck stiffness. Negative for back pain and joint swelling.  Skin: Negative for color change, pallor, rash and wound.  Neurological: Positive for weakness. Negative for dizziness, tremors and light-headedness.  Hematological: Negative for adenopathy. Does not bruise/bleed easily.  Psychiatric/Behavioral: Negative for behavioral problems, confusion, sleep disturbance, dysphoric mood, decreased concentration and agitation.       Objective:   Physical Exam  Constitutional: He is oriented to person, place, and time. He appears well-developed and well-nourished. No distress.  HENT:  Head: Normocephalic and atraumatic.  Mouth/Throat: Oropharynx is clear and moist. No oropharyngeal exudate.  Eyes: Conjunctivae and EOM are normal.  No scleral icterus.  Neck: Normal range of motion. Neck supple.    Cardiovascular: Normal rate, regular rhythm and normal heart sounds.   Pulmonary/Chest: Effort normal and breath sounds normal. No respiratory distress. He has no wheezes. He has no rales. He exhibits no tenderness.    Abdominal: He exhibits no distension. There is tenderness. There is no rebound and no guarding.  Musculoskeletal: He exhibits edema. He exhibits no tenderness.       Legs: Neurological: He is alert and oriented to person, place, and time. He exhibits normal muscle tone.  Skin: Skin is warm and dry. He is not diaphoretic. No erythema. No pallor.     Psychiatric: He has a normal mood and affect. His behavior is normal. Judgment and thought content normal.            Assessment & Plan:   MSSA bacteremia and a large retroperitoneal abscess prosthetic hip infection, sp resection removal of pacemaker, and sp Radical irrigation and debridement right total hip with removal of all synovium, revision of femoral head, revision of polyethylene liner placemen  TEE failed to show vegetations on valves or overtly on PM. PM is removed:  He finished t 8 weeks post surgery with HIGH DOSE IV ancef and oral rifampin and was change to high dose keflex  Keflex 500 mg 4 times daily along with continued rifampin 300 mg twice daily.  He then was readmited after multiple falls and had one more surgery in November with rrrigation and debridement of infected right total hip with removal of all synovium, revision of femoral head  --continue oral keflex, ok with not going back to rifampin --needs MINIMUM of 6 months if not a year of oral antibiotics with continued close monitoring  I spent greater than 40 minutes with the patient including greater than 50% of time in face to face counsel of the patient and in coordination of their care.   Frequent falls: I really worry about his safety at home but he and wife wish for him to remain there  Hip dislocation: see above discussion  Neck injury: seems to be MSK in nature, no fracture, defer to PCP and Dr. Turner Danielsowan re managment

## 2013-02-23 ENCOUNTER — Ambulatory Visit: Payer: Medicare Other | Admitting: Infectious Disease

## 2013-06-20 ENCOUNTER — Encounter: Payer: Self-pay | Admitting: Infectious Disease

## 2013-06-20 ENCOUNTER — Ambulatory Visit (INDEPENDENT_AMBULATORY_CARE_PROVIDER_SITE_OTHER): Payer: Medicare Other | Admitting: Infectious Disease

## 2013-06-20 VITALS — BP 100/55 | HR 59 | Temp 98.6°F | Ht 71.5 in | Wt 211.0 lb

## 2013-06-20 DIAGNOSIS — Z96649 Presence of unspecified artificial hip joint: Secondary | ICD-10-CM

## 2013-06-20 DIAGNOSIS — K6819 Other retroperitoneal abscess: Secondary | ICD-10-CM

## 2013-06-20 DIAGNOSIS — A419 Sepsis, unspecified organism: Secondary | ICD-10-CM

## 2013-06-20 DIAGNOSIS — A4101 Sepsis due to Methicillin susceptible Staphylococcus aureus: Secondary | ICD-10-CM

## 2013-06-20 DIAGNOSIS — T8459XA Infection and inflammatory reaction due to other internal joint prosthesis, initial encounter: Secondary | ICD-10-CM

## 2013-06-20 DIAGNOSIS — T827XXA Infection and inflammatory reaction due to other cardiac and vascular devices, implants and grafts, initial encounter: Secondary | ICD-10-CM

## 2013-06-20 DIAGNOSIS — T8450XA Infection and inflammatory reaction due to unspecified internal joint prosthesis, initial encounter: Secondary | ICD-10-CM

## 2013-06-20 MED ORDER — CEPHALEXIN 500 MG PO CAPS: 500.0000 mg | ORAL_CAPSULE | ORAL | Status: DC

## 2013-06-20 NOTE — Progress Notes (Signed)
Subjective:    Patient ID: [name], male    DOB: Sep 29, 1930, 80 y.o.   MRN: [id number]  HPI   80 y.o. male with Pacemaker, Right prosthetic hip, Laminectomy in January of 2014 admitted in August 2014 with right leg pain weakness fevers. He has been found to have MSSA bacteremia and a large retroperitoneal abscess that is adjacent to his prosthetic hip.  Orthopedics performed resection of Radical irrigation and debridement right total hip with removal of all synovium, revision of femoral head, revision of polyethylene liner placement On 09/02/12  TEE failed to show vegetations on valves or overtly on PM. PM is out. He returned to the hospital with dislocation of his hip. This was able to be reduced without surgical intervention. My partner Dr. Johnnye Sima saw the patient and noted his sedimentation rate and C-reactive protein is still be quite high. Patient completed a course of  8 weeks of cefazolin 2 g IV every 8 hours at the skilled nursing facility along with  oral rifampin twice daily.   I changed him to oral Keflex when his IV antibiotics were stopped and he continued on high-dose Keflex with rifampin since then.   Unfortunately he suffered a fall at home this past week when he attempted to get up onto a scale to weigh himself and slipped. His right hip was again dislocated. It was placed back into position in the emergency room and under conscious sedation.   He was readmitted in late October 2014 and underwent  Irrigation and debridement of infected right total hip with removal of all synovium, revision of femoral head to a +5 40 mm metal head, revision of polyethylene liner to a constrained liner  He has remained on keflex 590m po tid since then but stopped the rifampin  His hip pain has dissapeared but he has now pain in the right leg itself and difficulty raising leg vs gravity though he can bear weight on it. He has significant atrophy in this leg now.  He states pain in leg is  minimal and he is more having difficulty due to atrophy in this leg. To see Dr. RMayer Camelin 3 weeks.   Review of Systems  Constitutional: Negative for chills, diaphoresis, activity change, appetite change, fatigue and unexpected weight change.  HENT: Negative for sore throat.   Eyes: Negative for visual disturbance.  Respiratory: Negative for cough and chest tightness.   Gastrointestinal: Negative for nausea, vomiting, abdominal pain, diarrhea, constipation, blood in stool, abdominal distention and anal bleeding.  Genitourinary: Negative for dysuria, hematuria, flank pain and difficulty urinating.  Musculoskeletal: Positive for gait problem. Negative for back pain and joint swelling.  Skin: Negative for color change, pallor, rash and wound.  Neurological: Positive for weakness. Negative for dizziness, tremors and light-headedness.  Hematological: Negative for adenopathy. Does not bruise/bleed easily.  Psychiatric/Behavioral: Negative for behavioral problems, confusion, sleep disturbance, dysphoric mood, decreased concentration and agitation.       Objective:   Physical Exam  Constitutional: He is oriented to person, place, and time. He appears well-developed and well-nourished. No distress.  HENT:  Head: Normocephalic and atraumatic.  Mouth/Throat: Oropharynx is clear and moist. No oropharyngeal exudate.  Eyes: Conjunctivae and EOM are normal. No scleral icterus.  Neck: Normal range of motion. Neck supple.    Cardiovascular: Normal rate, regular rhythm and normal heart sounds.   Pulmonary/Chest: Effort normal and breath sounds normal. No respiratory distress. He has no wheezes. He has no rales. He exhibits no tenderness.  Abdominal: He exhibits no distension. There is tenderness. There is no rebound and no guarding.  Musculoskeletal: He exhibits no tenderness.       Legs: Neurological: He is alert and oriented to person, place, and time. He exhibits normal muscle tone.  Skin: Skin is  warm and dry. He is not diaphoretic. No erythema. No pallor.     Psychiatric: He has a normal mood and affect. His behavior is normal. Judgment and thought content normal.          Assessment & Plan:  #1  MSSA bacteremia and a large retroperitoneal abscess prosthetic hip infection, sp resection removal of pacemaker, and sp Radical irrigation and debridement right total hip with removal of all synovium, revision of femoral head, revision of polyethylene liner placemen  TEE failed to show vegetations on valves or overtly on PM. PM is removed:  He finished t 8 weeks post surgery with HIGH DOSE IV ancef and oral rifampin and was change to high dose keflex  Keflex 500 mg 4 times daily along with continued rifampin 300 mg twice daily.  He then was readmited after multiple falls and had one more surgery in November with rrrigation and debridement of infected right total hip with removal of all synovium, revision of femoral head then on keflex 500 mg tid.  --continue oral keflex, --recheck esr, crp and bmp  Hip dislocation: see above discussion

## 2013-06-21 LAB — BASIC METABOLIC PANEL WITH GFR
BUN: 32 mg/dL — ABNORMAL HIGH (ref 6–23)
CO2: 33 mEq/L — ABNORMAL HIGH (ref 19–32)
Calcium: 9.1 mg/dL (ref 8.4–10.5)
Chloride: 95 mEq/L — ABNORMAL LOW (ref 96–112)
Creat: 1.67 mg/dL — ABNORMAL HIGH (ref 0.50–1.35)
GFR, EST AFRICAN AMERICAN: 44 mL/min — AB
GFR, Est Non African American: 38 mL/min — ABNORMAL LOW
GLUCOSE: 174 mg/dL — AB (ref 70–99)
Potassium: 3.5 mEq/L (ref 3.5–5.3)
SODIUM: 138 meq/L (ref 135–145)

## 2013-06-21 LAB — SEDIMENTATION RATE: SED RATE: 44 mm/h — AB (ref 0–16)

## 2013-06-21 LAB — C-REACTIVE PROTEIN: CRP: 1.7 mg/dL — AB (ref <0.60)

## 2013-11-10 ENCOUNTER — Other Ambulatory Visit: Payer: Self-pay

## 2013-11-17 ENCOUNTER — Other Ambulatory Visit: Payer: Self-pay | Admitting: Licensed Clinical Social Worker

## 2013-11-17 MED ORDER — CEPHALEXIN 500 MG PO CAPS: 500.0000 mg | ORAL_CAPSULE | ORAL | Status: DC

## 2014-07-23 ENCOUNTER — Other Ambulatory Visit: Payer: Self-pay

## 2014-08-08 ENCOUNTER — Other Ambulatory Visit: Payer: Self-pay | Admitting: Orthopedic Surgery

## 2014-08-09 ENCOUNTER — Encounter (HOSPITAL_COMMUNITY): Payer: Self-pay | Admitting: *Deleted

## 2014-08-09 MED ORDER — DEXTROSE-NACL 5-0.45 % IV SOLN: INTRAVENOUS | Status: DC

## 2014-08-09 MED ORDER — CEFAZOLIN SODIUM-DEXTROSE 2-3 GM-% IV SOLR
2.0000 g | INTRAVENOUS | Status: AC
  Administered 2014-08-10: 2 g via INTRAVENOUS
  Filled 2014-08-09: qty 50

## 2014-08-09 NOTE — Progress Notes (Addendum)
Patient has a history of AF, had a pacemaker, was removed in 2014 due to infection.  Patient's blood thinner is Aspirin 325. Patients last dose was Tuesday, July 12. Patient denies chest pain, shortness of breath or palpations.

## 2014-08-09 NOTE — H&P (Signed)
[name] is an 80 y.o. male.    Chief Complaint: Right Hip Drainage  HPI: Patient returns reporting that the 2 x 1 cm nodule the distal aspect of his right total hip wound actually came open and is draining clear fluid.  Again, this is the same side were he had a psoas abscess that seeded his right total hip in 2014, leading to a washout rather revision of the polyethylene liner, leading to dislocations after that surgery, the required placement of a constrained liner in November of 2014.  The organism was sensitive to cephalexin and he is been on that ever since the last surgery in November of 2014.  He denies any fevers or chills.  He reports no pain in the groin or the hip itself.  Patient denies any specific back pain.  Past Medical History  Diagnosis Date  . Atrial fibrillation   . Hypertension   . Pacemaker   . Arthritis   . Anxiety   . Depression   . MSSA (methicillin susceptible Staphylococcus aureus) infection   . Bacteremia   . Infected pacemaker   . Septic hip   . Complication of anesthesia     " Terrible Nightmares"  . Dysrhythmia     Afib- had pacemaker, had it removed- takes Aspirin 325- daily  . Diabetes mellitus without complication     Type II  . Constipation   . History of blood product transfusion   . OAB (overactive bladder)   . CKD (chronic kidney disease)   . Hypothyroidism     Past Surgical History  Procedure Laterality Date  . Pacemaker insertion    . Lumbar laminectomy/decompression microdiscectomy  02/14/2012    Procedure: LUMBAR LAMINECTOMY/DECOMPRESSION MICRODISCECTOMY 1 LEVEL;  Surgeon: Tia Alert, MD;  Location: [facility] NEURO ORS;  Service: Neurosurgery;  Laterality: N/A;  Thoracic twelve - Lumbar one decompressive laminectomy.  Rhae Hammock without cardioversion N/A 08/30/2012    Procedure: TRANSESOPHAGEAL ECHOCARDIOGRAM (TEE);  Surgeon: Laurey Morale, MD;  Location: [facility] END[facility]COPY;  Service: Cardiovascular;  Laterality: N/A;  . Icd lead removal Left  08/31/2012    Procedure: ICD LEAD REMOVAL;  Surgeon: Marinus Maw, MD;  Location: [facility] OR;  Service: Cardiovascular;  Laterality: Left;  . Incision and drainage hip Right 09/02/2012    Procedure: IRRIGATION AND DEBRIDEMENT RIGHT HIP;  Surgeon: Nestor Lewandowsky, MD;  Location: [facility] OR;  Service: Orthopedics;  Laterality: Right;  . Total hip revision Right 09/02/2012    Procedure: REVISION OF POLYETHYLENE LINER AND FEMORAL HEAD ;  Surgeon: Nestor Lewandowsky, MD;  Location: [facility] OR;  Service: Orthopedics;  Laterality: Right;  . Hip closed reduction Right 10/02/2012    Procedure: CLOSED REDUCTION HIP;  Surgeon: Velna Ochs, MD;  Location: [facility] OR;  Service: Orthopedics;  Laterality: Right;  . Total hip revision Right 11/28/2012    Procedure: TOTAL HIP REVISION With Placement of Contrain Liner;  Surgeon: Nestor Lewandowsky, MD;  Location: [facility] OR;  Service: Orthopedics;  Laterality: Right;  . Colonoscopy w/ polypectomy      small    History reviewed. No pertinent family history. Social History:  reports that he has never smoked. He has never used smokeless tobacco. He reports that he does not drink alcohol or use illicit drugs.  Allergies:  Allergies  Allergen Reactions  . Ambien [Zolpidem]     hallucinations    No prescriptions prior to admission    No results found for this or any previous visit (from  the past 48 hour(s)). No results found.  Review of Systems  HENT: Positive for hearing loss.   Eyes:       Glasses  Cardiovascular:       HTN  Psychiatric/Behavioral: Positive for depression.    Height 5\' 11"  (1.803 m), weight 106.142 kg (234 lb). Physical Exam  Constitutional: He is oriented to person, place, and time. He appears well-developed and well-nourished.  HENT:  Head: Normocephalic and atraumatic.  Eyes: Pupils are equal, round, and reactive to light.  Neck: Neck supple.  Cardiovascular: Intact distal pulses.   Respiratory: Effort normal.  Musculoskeletal: He exhibits tenderness.  The  size of nodules unchanged at the distal aspect of the wound.  There is a 2 mm opening and if you press hard he can express a little bit of blood and clear fluid.  This was cleansed with alcohol and I sent a specimen off for Gram stain, aerobic and anaerobic cultures.  He has no pain with internal or external rotation of the hip, foot tap is negative.  There is no swelling or erythema.  Any or else on the wound, only distally where the knot of the subcutaneous tissue is it is mobile and minimally tender.  Neurological: He is alert and oriented to person, place, and time.  Skin: Skin is warm and dry.  Psychiatric: He has a normal mood and affect. His behavior is normal. Judgment and thought content normal.      RADIOGRAPHS:  X-rays included; AP pelvis and crosstable lateral shows well fit placed well fixed prostheses, the locking ring on the constrained liners in place.  There is no evidence of loosening or ostial lysis.  Assessment/Plan Assess: Drainage from right total hip wound and underwent irrigation and debridement for a psoas abscess that may have seeded his total hip 2 years ago.  Patient is on chronic cephalexin since then.  Plan: Patient will be taken to the hospital in 2 days for irrigation and debridement of his right hip wound that may involve removal of the prostheses.  If it goes deep enough.  This was discussed at length with the patient and his wife.  They understand the seriousness of the situation although the wound itself looks relatively benign, the drainage may be coming from deep or the prosthesis is.  If the prosthesis is removed.  He'll have deep drains and essentially a Girdlestone hip going forward.  Jayla Mackie R 08/09/2014, 5:07 PM

## 2014-08-09 NOTE — Progress Notes (Signed)
Anesthesia Chart Review: SAME DAY WORK-UP.  Patient is an 81 year old male scheduled for I&D right hip, superficial versus deep on 08/10/14 by Dr. Turner Danielsowan.  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    Cardiologist [facility] with [facility].  He was last seen on 01/11/14 by Haynes Kernshristine Thornsbury, NP/Dr. Josefa HalfBarrett Cheek for afib follow-up. By notes he was feeling well then and tolerating PT twice a week (although walks with a walker). There were no CHF symptoms. By notes, his EKG showed rate controlled afib. She re-addressed afib/CVA prevention. Warfarin had been stopped due to difficulty managing. He wanted to continue ASA and not try a NOAC despite CVA risk (CHADS score 3).  No changes were made in his medication regimen.       Med list is not yet updated. As of 12/2013 meds included 65 Fe, Diltiazem, levothyroxine, ASA, terazosin, lovastatin, KCl, torsemide, metolazone, metoprolol tartrate, finasteride, pioglitazone. PAT RN says he is on ASA only for afib which is now on hold for surgery.    08/30/12 TEE: Study Conclusions - Left ventricle: Systolic function was normal. The estimated ejection fraction was in the range of 55% to 60%. - Aortic valve: No evidence of vegetation. There was no stenosis. - Aorta: Normal caliber aorta with minimal plaque. - Mitral valve: No evidence of vegetation. Mild regurgitation. - Left atrium: The atrium was mildly dilated. No evidence of thrombus in the  atrial cavity or appendage. There was smoke in the left atrium. - Right ventricle: Poorly visualized. The cavity size was normal. Systolic function was normal. - Right atrium: Pacemaker was present but was poorly visualized, cannot comment on presence or absence of vegetation. No evidence of thrombus in the atrial cavity or appendage. - Atrial septum: There was increased thickness of the septum, consistent with lipomatous hypertrophy. No defect or patent foramen ovale was identified. Echo contrast study showed no right-to-left atrial level shunt, at baseline or with provocation. - Tricuspid valve: No evidence of vegetation. Peak RV-RA gradient: 17mm Hg (S). - Pulmonic valve: No evidence of vegetation. - Pericardium, extracardiac: Small pericardial effusion noted adjacent to the RV. Impressions: - No evidence of endocarditis.  He is for labs on arrival.  (Cr 12/07/13 was 1.5.)  Patient seen by cardiology within the past year. By notes, was doing well. PAT RN Jan spoke with Dr. Turner Danielsowan who said that case needed to be done. Since he is a same day work-up, he will be further evaluated by his assigned anesthesiologist to ensure afib remains rate controlled and no new CV/CHF symptoms.  He denied SOB/CP and palpitations during his phone interview.  Above reviewed with anesthesiologist Dr. Katrinka BlazingSmith. Further evaluation by his assigned anesthesiologist on the day of surgery to determine definitve anesthesia plan.  Velna Ochsllison Zelenak, PA-C [facility]Anesthesiology Phone [phone] 08/09/2014 1:11 PM

## 2014-08-10 ENCOUNTER — Encounter (HOSPITAL_COMMUNITY): Payer: Self-pay | Admitting: Surgery

## 2014-08-10 ENCOUNTER — Inpatient Hospital Stay (HOSPITAL_COMMUNITY): Payer: Medicare Other

## 2014-08-10 ENCOUNTER — Inpatient Hospital Stay (HOSPITAL_COMMUNITY)
Admission: RE | Admit: 2014-08-10 | Discharge: 2014-08-14 | DRG: 464 | Disposition: A | Discharge status: Skilled Nursing Facility | Payer: Medicare Other | Source: Ambulatory Visit | Attending: Orthopedic Surgery | Admitting: Orthopedic Surgery

## 2014-08-10 ENCOUNTER — Encounter (HOSPITAL_COMMUNITY)
Admission: RE | Disposition: A | Discharge status: Skilled Nursing Facility | Payer: Self-pay | Source: Ambulatory Visit | Attending: Orthopedic Surgery

## 2014-08-10 ENCOUNTER — Ambulatory Visit (HOSPITAL_COMMUNITY): Payer: Medicare Other | Admitting: Vascular Surgery

## 2014-08-10 DIAGNOSIS — Z888 Allergy status to other drugs, medicaments and biological substances status: Secondary | ICD-10-CM | POA: Diagnosis not present

## 2014-08-10 DIAGNOSIS — I129 Hypertensive chronic kidney disease with stage 1 through stage 4 chronic kidney disease, or unspecified chronic kidney disease: Secondary | ICD-10-CM | POA: Diagnosis present

## 2014-08-10 DIAGNOSIS — Z981 Arthrodesis status: Secondary | ICD-10-CM | POA: Diagnosis not present

## 2014-08-10 DIAGNOSIS — Y831 Surgical operation with implant of artificial internal device as the cause of abnormal reaction of the patient, or of later complication, without mention of misadventure at the time of the procedure: Secondary | ICD-10-CM | POA: Diagnosis present

## 2014-08-10 DIAGNOSIS — E039 Hypothyroidism, unspecified: Secondary | ICD-10-CM | POA: Diagnosis present

## 2014-08-10 DIAGNOSIS — N183 Chronic kidney disease, stage 3 (moderate): Secondary | ICD-10-CM | POA: Diagnosis present

## 2014-08-10 DIAGNOSIS — F419 Anxiety disorder, unspecified: Secondary | ICD-10-CM | POA: Diagnosis present

## 2014-08-10 DIAGNOSIS — T8451XA Infection and inflammatory reaction due to internal right hip prosthesis, initial encounter: Principal | ICD-10-CM

## 2014-08-10 DIAGNOSIS — D62 Acute posthemorrhagic anemia: Secondary | ICD-10-CM | POA: Diagnosis not present

## 2014-08-10 DIAGNOSIS — F329 Major depressive disorder, single episode, unspecified: Secondary | ICD-10-CM | POA: Diagnosis present

## 2014-08-10 DIAGNOSIS — R7881 Bacteremia: Secondary | ICD-10-CM | POA: Diagnosis present

## 2014-08-10 DIAGNOSIS — T8149XA Infection following a procedure, other surgical site, initial encounter: Secondary | ICD-10-CM | POA: Diagnosis present

## 2014-08-10 DIAGNOSIS — Z950 Presence of cardiac pacemaker: Secondary | ICD-10-CM | POA: Diagnosis not present

## 2014-08-10 DIAGNOSIS — Z9889 Other specified postprocedural states: Secondary | ICD-10-CM

## 2014-08-10 DIAGNOSIS — B999 Unspecified infectious disease: Secondary | ICD-10-CM

## 2014-08-10 DIAGNOSIS — E119 Type 2 diabetes mellitus without complications: Secondary | ICD-10-CM | POA: Diagnosis present

## 2014-08-10 DIAGNOSIS — Z96641 Presence of right artificial hip joint: Secondary | ICD-10-CM | POA: Diagnosis not present

## 2014-08-10 DIAGNOSIS — Z7982 Long term (current) use of aspirin: Secondary | ICD-10-CM

## 2014-08-10 DIAGNOSIS — I4891 Unspecified atrial fibrillation: Secondary | ICD-10-CM | POA: Diagnosis present

## 2014-08-10 DIAGNOSIS — B9561 Methicillin susceptible Staphylococcus aureus infection as the cause of diseases classified elsewhere: Secondary | ICD-10-CM

## 2014-08-10 HISTORY — DX: Other complications of anesthesia, initial encounter: T88.59XA

## 2014-08-10 HISTORY — DX: Adverse effect of unspecified anesthetic, initial encounter: T41.45XA

## 2014-08-10 HISTORY — DX: Hypothyroidism, unspecified: E03.9

## 2014-08-10 HISTORY — DX: Constipation, unspecified: K59.00

## 2014-08-10 HISTORY — DX: Personal history of other medical treatment: Z92.89

## 2014-08-10 HISTORY — DX: Overactive bladder: N32.81

## 2014-08-10 HISTORY — PX: INCISION AND DRAINAGE HIP: SHX1801

## 2014-08-10 LAB — GLUCOSE, CAPILLARY
GLUCOSE-CAPILLARY: 76 mg/dL (ref 65–99)
GLUCOSE-CAPILLARY: 148 mg/dL — AB (ref 65–99)
Glucose-Capillary: 78 mg/dL (ref 65–99)
Glucose-Capillary: 93 mg/dL (ref 65–99)
Glucose-Capillary: 114 mg/dL — ABNORMAL HIGH (ref 65–99)

## 2014-08-10 LAB — CBC
HCT: 33.7 % — ABNORMAL LOW (ref 39.0–52.0)
Hemoglobin: 10.7 g/dL — ABNORMAL LOW (ref 13.0–17.0)
MCH: 29.7 pg (ref 26.0–34.0)
MCHC: 31.8 g/dL (ref 30.0–36.0)
MCV: 93.6 fL (ref 78.0–100.0)
Platelets: 179 10*3/uL (ref 150–400)
RBC: 3.6 MIL/uL — ABNORMAL LOW (ref 4.22–5.81)
RDW: 13.9 % (ref 11.5–15.5)
WBC: 7.3 10*3/uL (ref 4.0–10.5)

## 2014-08-10 LAB — SEDIMENTATION RATE: Sed Rate: 62 mm/hr — ABNORMAL HIGH (ref 0–16)

## 2014-08-10 LAB — PREPARE RBC (CROSSMATCH)

## 2014-08-10 LAB — BASIC METABOLIC PANEL
ANION GAP: 7 (ref 5–15)
BUN: 20 mg/dL (ref 6–20)
CO2: 25 mmol/L (ref 22–32)
Calcium: 8.7 mg/dL — ABNORMAL LOW (ref 8.9–10.3)
Chloride: 110 mmol/L (ref 101–111)
Creatinine, Ser: 1.69 mg/dL — ABNORMAL HIGH (ref 0.61–1.24)
GFR calc Af Amer: 42 mL/min — ABNORMAL LOW (ref >60)
GFR, EST NON AFRICAN AMERICAN: 36 mL/min — AB (ref >60)
GLUCOSE: 90 mg/dL (ref 65–99)
POTASSIUM: 4 mmol/L (ref 3.5–5.1)
Sodium: 142 mmol/L (ref 135–145)

## 2014-08-10 LAB — C-REACTIVE PROTEIN: CRP: 1.7 mg/dL — ABNORMAL HIGH (ref <1.0)

## 2014-08-10 LAB — HEMOGLOBIN AND HEMATOCRIT, BLOOD
HEMATOCRIT: 24.5 % — AB (ref 39.0–52.0)
HEMOGLOBIN: 7.9 g/dL — AB (ref 13.0–17.0)

## 2014-08-10 SURGERY — IRRIGATION AND DEBRIDEMENT HIP
Anesthesia: General | Site: Hip | Laterality: Right

## 2014-08-10 MED ORDER — SENNOSIDES-DOCUSATE SODIUM 8.6-50 MG PO TABS
1.0000 | ORAL_TABLET | Freq: Every evening | ORAL | Status: DC | PRN
  Administered 2014-08-11: 1 via ORAL
  Filled 2014-08-10: qty 1

## 2014-08-10 MED ORDER — FENTANYL CITRATE (PF) 100 MCG/2ML IJ SOLN
INTRAMUSCULAR | Status: DC | PRN
  Administered 2014-08-10 (×5): 50 ug via INTRAVENOUS

## 2014-08-10 MED ORDER — METHOCARBAMOL 1000 MG/10ML IJ SOLN
500.0000 mg | Freq: Four times a day (QID) | INTRAVENOUS | Status: DC | PRN
  Filled 2014-08-10: qty 5

## 2014-08-10 MED ORDER — METOCLOPRAMIDE HCL 5 MG/ML IJ SOLN: 5.0000 mg | Freq: Three times a day (TID) | INTRAMUSCULAR | Status: DC | PRN

## 2014-08-10 MED ORDER — OXYCODONE HCL 5 MG PO TABS
5.0000 mg | ORAL_TABLET | ORAL | Status: DC | PRN
  Administered 2014-08-10 – 2014-08-11 (×4): 10 mg via ORAL
  Administered 2014-08-12: 5 mg via ORAL
  Administered 2014-08-12: 10 mg via ORAL
  Administered 2014-08-13 – 2014-08-14 (×3): 5 mg via ORAL
  Filled 2014-08-10: qty 2
  Filled 2014-08-10 (×2): qty 1
  Filled 2014-08-10: qty 2
  Filled 2014-08-10: qty 1
  Filled 2014-08-10 (×3): qty 2

## 2014-08-10 MED ORDER — PHENYLEPHRINE 40 MCG/ML (10ML) SYRINGE FOR IV PUSH (FOR BLOOD PRESSURE SUPPORT)
PREFILLED_SYRINGE | INTRAVENOUS | Status: AC
  Filled 2014-08-10: qty 10

## 2014-08-10 MED ORDER — NEOSTIGMINE METHYLSULFATE 10 MG/10ML IV SOLN
INTRAVENOUS | Status: DC | PRN
  Administered 2014-08-10: 4 mg via INTRAVENOUS

## 2014-08-10 MED ORDER — CEFUROXIME SODIUM 1.5 G IJ SOLR
INTRAMUSCULAR | Status: AC
  Filled 2014-08-10: qty 3

## 2014-08-10 MED ORDER — METHOCARBAMOL 500 MG PO TABS
500.0000 mg | ORAL_TABLET | Freq: Four times a day (QID) | ORAL | Status: DC | PRN
  Administered 2014-08-10 – 2014-08-11 (×4): 500 mg via ORAL
  Filled 2014-08-10 (×3): qty 1

## 2014-08-10 MED ORDER — LACTATED RINGERS IV SOLN
INTRAVENOUS | Status: DC
Start: 2014-08-10 — End: 2014-08-15
  Administered 2014-08-10 (×3): via INTRAVENOUS

## 2014-08-10 MED ORDER — CEFAZOLIN SODIUM 1-5 GM-% IV SOLN
1.0000 g | Freq: Two times a day (BID) | INTRAVENOUS | Status: DC
  Administered 2014-08-10 – 2014-08-14 (×9): 1 g via INTRAVENOUS
  Filled 2014-08-10 (×10): qty 50

## 2014-08-10 MED ORDER — FENTANYL CITRATE (PF) 250 MCG/5ML IJ SOLN
INTRAMUSCULAR | Status: AC
  Filled 2014-08-10: qty 5

## 2014-08-10 MED ORDER — PROPOFOL 10 MG/ML IV BOLUS
INTRAVENOUS | Status: AC
  Filled 2014-08-10: qty 20

## 2014-08-10 MED ORDER — INSULIN ASPART 100 UNIT/ML ~~LOC~~ SOLN
0.0000 [IU] | Freq: Three times a day (TID) | SUBCUTANEOUS | Status: DC
  Administered 2014-08-11: 2 [IU] via SUBCUTANEOUS
  Administered 2014-08-11: 3 [IU] via SUBCUTANEOUS
  Administered 2014-08-11: 2 [IU] via SUBCUTANEOUS
  Administered 2014-08-12: 3 [IU] via SUBCUTANEOUS
  Administered 2014-08-12: 2 [IU] via SUBCUTANEOUS
  Administered 2014-08-12: 3 [IU] via SUBCUTANEOUS

## 2014-08-10 MED ORDER — ALPRAZOLAM 0.5 MG PO TABS
0.5000 mg | ORAL_TABLET | ORAL | Status: DC | PRN
  Administered 2014-08-12: 0.5 mg via ORAL
  Filled 2014-08-10: qty 1

## 2014-08-10 MED ORDER — HYDROGEN PEROXIDE 3 % EX SOLN
CUTANEOUS | Status: DC | PRN
  Administered 2014-08-10: 1

## 2014-08-10 MED ORDER — EPHEDRINE SULFATE 50 MG/ML IJ SOLN
INTRAMUSCULAR | Status: DC | PRN
Start: 2014-08-10 — End: 2014-08-10
  Administered 2014-08-10 (×2): 5 mg via INTRAVENOUS
  Administered 2014-08-10: 10 mg via INTRAVENOUS
  Administered 2014-08-10 (×3): 5 mg via INTRAVENOUS
  Administered 2014-08-10: 10 mg via INTRAVENOUS
  Administered 2014-08-10: 5 mg via INTRAVENOUS

## 2014-08-10 MED ORDER — DOCUSATE SODIUM 100 MG PO CAPS
100.0000 mg | ORAL_CAPSULE | Freq: Two times a day (BID) | ORAL | Status: DC
  Administered 2014-08-10 – 2014-08-14 (×9): 100 mg via ORAL
  Filled 2014-08-10 (×9): qty 1

## 2014-08-10 MED ORDER — HYDROMORPHONE HCL 1 MG/ML IJ SOLN
INTRAMUSCULAR | Status: AC
  Filled 2014-08-10: qty 1

## 2014-08-10 MED ORDER — TRANEXAMIC ACID 1000 MG/10ML IV SOLN
2000.0000 mg | INTRAVENOUS | Status: DC | PRN
  Administered 2014-08-10: 2000 mg via TOPICAL

## 2014-08-10 MED ORDER — BISACODYL 5 MG PO TBEC
5.0000 mg | DELAYED_RELEASE_TABLET | Freq: Every day | ORAL | Status: DC | PRN
  Administered 2014-08-13: 5 mg via ORAL
  Filled 2014-08-10: qty 1

## 2014-08-10 MED ORDER — METHOCARBAMOL 500 MG PO TABS
ORAL_TABLET | ORAL | Status: AC
  Filled 2014-08-10: qty 1

## 2014-08-10 MED ORDER — PROPOFOL 10 MG/ML IV BOLUS
INTRAVENOUS | Status: DC | PRN
  Administered 2014-08-10: 160 mg via INTRAVENOUS

## 2014-08-10 MED ORDER — ALBUMIN HUMAN 5 % IV SOLN
INTRAVENOUS | Status: DC | PRN
  Administered 2014-08-10 (×2): via INTRAVENOUS

## 2014-08-10 MED ORDER — METOCLOPRAMIDE HCL 5 MG PO TABS: 5.0000 mg | ORAL_TABLET | Freq: Three times a day (TID) | ORAL | Status: DC | PRN

## 2014-08-10 MED ORDER — ACETAMINOPHEN 325 MG PO TABS
650.0000 mg | ORAL_TABLET | Freq: Four times a day (QID) | ORAL | Status: DC | PRN
  Administered 2014-08-13 – 2014-08-14 (×2): 650 mg via ORAL
  Filled 2014-08-10 (×2): qty 2

## 2014-08-10 MED ORDER — TOBRAMYCIN SULFATE 1.2 G IJ SOLR
INTRAMUSCULAR | Status: AC
  Filled 2014-08-10: qty 4.8

## 2014-08-10 MED ORDER — PRAVASTATIN SODIUM 40 MG PO TABS
40.0000 mg | ORAL_TABLET | Freq: Every day | ORAL | Status: DC
  Administered 2014-08-11 – 2014-08-13 (×3): 40 mg via ORAL
  Filled 2014-08-10 (×3): qty 1

## 2014-08-10 MED ORDER — KCL IN DEXTROSE-NACL 20-5-0.45 MEQ/L-%-% IV SOLN
INTRAVENOUS | Status: DC
  Administered 2014-08-10 – 2014-08-11 (×2): via INTRAVENOUS
  Filled 2014-08-10 (×3): qty 1000

## 2014-08-10 MED ORDER — CEFUROXIME SODIUM 1.5 G IJ SOLR
INTRAMUSCULAR | Status: DC | PRN
  Administered 2014-08-10 (×2): 1.5 g

## 2014-08-10 MED ORDER — PHENOL 1.4 % MT LIQD: 1.0000 | OROMUCOSAL | Status: DC | PRN

## 2014-08-10 MED ORDER — OXYCODONE HCL 5 MG PO TABS
ORAL_TABLET | ORAL | Status: AC
  Filled 2014-08-10: qty 2

## 2014-08-10 MED ORDER — PHENYLEPHRINE HCL 10 MG/ML IJ SOLN
10.0000 mg | INTRAMUSCULAR | Status: DC | PRN
  Administered 2014-08-10: 20 ug/min via INTRAVENOUS

## 2014-08-10 MED ORDER — SODIUM CHLORIDE 0.9 % IV SOLN
Freq: Once | INTRAVENOUS | Status: AC
  Administered 2014-08-13: 09:00:00 via INTRAVENOUS

## 2014-08-10 MED ORDER — HYDROMORPHONE HCL 1 MG/ML IJ SOLN
0.2500 mg | INTRAMUSCULAR | Status: DC | PRN
  Administered 2014-08-10: 0.5 mg via INTRAVENOUS

## 2014-08-10 MED ORDER — MENTHOL 3 MG MT LOZG: 1.0000 | LOZENGE | OROMUCOSAL | Status: DC | PRN

## 2014-08-10 MED ORDER — DIPHENHYDRAMINE HCL 12.5 MG/5ML PO ELIX: 12.5000 mg | ORAL_SOLUTION | ORAL | Status: DC | PRN

## 2014-08-10 MED ORDER — PROMETHAZINE HCL 25 MG/ML IJ SOLN: 6.2500 mg | INTRAMUSCULAR | Status: DC | PRN

## 2014-08-10 MED ORDER — TORSEMIDE 20 MG PO TABS
20.0000 mg | ORAL_TABLET | Freq: Every day | ORAL | Status: DC
  Administered 2014-08-11 – 2014-08-14 (×4): 20 mg via ORAL
  Filled 2014-08-10 (×4): qty 1

## 2014-08-10 MED ORDER — HYDROMORPHONE HCL 1 MG/ML IJ SOLN
0.5000 mg | INTRAMUSCULAR | Status: DC | PRN
  Administered 2014-08-10: 0.5 mg via INTRAVENOUS
  Filled 2014-08-10: qty 1

## 2014-08-10 MED ORDER — DILTIAZEM HCL ER COATED BEADS 120 MG PO CP24
120.0000 mg | ORAL_CAPSULE | Freq: Every day | ORAL | Status: DC
  Administered 2014-08-11 – 2014-08-14 (×4): 120 mg via ORAL
  Filled 2014-08-10 (×5): qty 1

## 2014-08-10 MED ORDER — FLEET ENEMA 7-19 GM/118ML RE ENEM: 1.0000 | ENEMA | Freq: Once | RECTAL | Status: AC | PRN

## 2014-08-10 MED ORDER — ASPIRIN EC 325 MG PO TBEC
325.0000 mg | DELAYED_RELEASE_TABLET | Freq: Every day | ORAL | Status: DC
  Administered 2014-08-11 – 2014-08-14 (×4): 325 mg via ORAL
  Filled 2014-08-10 (×4): qty 1

## 2014-08-10 MED ORDER — TERAZOSIN HCL 5 MG PO CAPS
5.0000 mg | ORAL_CAPSULE | Freq: Every day | ORAL | Status: DC
  Administered 2014-08-10 – 2014-08-14 (×5): 5 mg via ORAL
  Filled 2014-08-10 (×7): qty 1

## 2014-08-10 MED ORDER — ONDANSETRON HCL 4 MG/2ML IJ SOLN
INTRAMUSCULAR | Status: AC
  Filled 2014-08-10: qty 2

## 2014-08-10 MED ORDER — POTASSIUM CHLORIDE CRYS ER 20 MEQ PO TBCR
20.0000 meq | EXTENDED_RELEASE_TABLET | Freq: Every day | ORAL | Status: DC
  Administered 2014-08-11 – 2014-08-14 (×4): 20 meq via ORAL
  Filled 2014-08-10 (×4): qty 1

## 2014-08-10 MED ORDER — ROCURONIUM BROMIDE 100 MG/10ML IV SOLN
INTRAVENOUS | Status: DC | PRN
  Administered 2014-08-10: 50 mg via INTRAVENOUS
  Administered 2014-08-10 (×2): 10 mg via INTRAVENOUS

## 2014-08-10 MED ORDER — TRANEXAMIC ACID 1000 MG/10ML IV SOLN
2000.0000 mg | INTRAVENOUS | Status: AC
  Filled 2014-08-10: qty 20

## 2014-08-10 MED ORDER — MELOXICAM 15 MG PO TABS
15.0000 mg | ORAL_TABLET | Freq: Every day | ORAL | Status: DC
  Administered 2014-08-11 – 2014-08-14 (×4): 15 mg via ORAL
  Filled 2014-08-10 (×5): qty 1

## 2014-08-10 MED ORDER — ONDANSETRON HCL 4 MG PO TABS: 4.0000 mg | ORAL_TABLET | Freq: Four times a day (QID) | ORAL | Status: DC | PRN

## 2014-08-10 MED ORDER — GLYCOPYRROLATE 0.2 MG/ML IJ SOLN
INTRAMUSCULAR | Status: AC
  Filled 2014-08-10: qty 4

## 2014-08-10 MED ORDER — ACETAMINOPHEN 650 MG RE SUPP: 650.0000 mg | Freq: Four times a day (QID) | RECTAL | Status: DC | PRN

## 2014-08-10 MED ORDER — LEVOTHYROXINE SODIUM 88 MCG PO TABS
88.0000 ug | ORAL_TABLET | Freq: Every day | ORAL | Status: DC
Start: 2014-08-11 — End: 2014-08-15
  Administered 2014-08-11 – 2014-08-14 (×4): 88 ug via ORAL
  Filled 2014-08-10 (×4): qty 1

## 2014-08-10 MED ORDER — DEXAMETHASONE SODIUM PHOSPHATE 10 MG/ML IJ SOLN
10.0000 mg | Freq: Once | INTRAMUSCULAR | Status: AC
  Administered 2014-08-11: 10 mg via INTRAVENOUS
  Filled 2014-08-10: qty 1

## 2014-08-10 MED ORDER — ROCURONIUM BROMIDE 50 MG/5ML IV SOLN
INTRAVENOUS | Status: AC
  Filled 2014-08-10: qty 1

## 2014-08-10 MED ORDER — GLYCOPYRROLATE 0.2 MG/ML IJ SOLN
INTRAMUSCULAR | Status: DC | PRN
  Administered 2014-08-10: 0.2 mg via INTRAVENOUS
  Administered 2014-08-10: 0.6 mg via INTRAVENOUS

## 2014-08-10 MED ORDER — PIOGLITAZONE HCL 15 MG PO TABS
15.0000 mg | ORAL_TABLET | Freq: Every day | ORAL | Status: DC
  Administered 2014-08-11 – 2014-08-14 (×4): 15 mg via ORAL
  Filled 2014-08-10 (×5): qty 1

## 2014-08-10 MED ORDER — LIDOCAINE HCL (CARDIAC) 20 MG/ML IV SOLN
INTRAVENOUS | Status: DC | PRN
  Administered 2014-08-10: 60 mg via INTRAVENOUS

## 2014-08-10 MED ORDER — ONDANSETRON HCL 4 MG/2ML IJ SOLN
INTRAMUSCULAR | Status: DC | PRN
  Administered 2014-08-10: 4 mg via INTRAVENOUS

## 2014-08-10 MED ORDER — PHENYLEPHRINE HCL 10 MG/ML IJ SOLN
INTRAMUSCULAR | Status: DC | PRN
  Administered 2014-08-10: 40 ug via INTRAVENOUS
  Administered 2014-08-10: 80 ug via INTRAVENOUS
  Administered 2014-08-10: 40 ug via INTRAVENOUS

## 2014-08-10 MED ORDER — ONDANSETRON HCL 4 MG/2ML IJ SOLN
4.0000 mg | Freq: Four times a day (QID) | INTRAMUSCULAR | Status: DC | PRN
  Administered 2014-08-12: 4 mg via INTRAVENOUS
  Filled 2014-08-10: qty 2

## 2014-08-10 MED ORDER — CHLORHEXIDINE GLUCONATE 4 % EX LIQD: 60.0000 mL | Freq: Once | CUTANEOUS | Status: DC

## 2014-08-10 MED ORDER — TOBRAMYCIN SULFATE 1.2 G IJ SOLR
INTRAMUSCULAR | Status: DC | PRN
  Administered 2014-08-10 (×4): 1.2 g

## 2014-08-10 MED ORDER — METOPROLOL SUCCINATE ER 25 MG PO TB24
12.5000 mg | ORAL_TABLET | Freq: Every day | ORAL | Status: DC
Start: 2014-08-10 — End: 2014-08-15
  Administered 2014-08-11 – 2014-08-14 (×4): 12.5 mg via ORAL
  Filled 2014-08-10 (×4): qty 1

## 2014-08-10 MED ORDER — MIRABEGRON ER 50 MG PO TB24
50.0000 mg | ORAL_TABLET | Freq: Every day | ORAL | Status: DC
  Administered 2014-08-11 – 2014-08-14 (×4): 50 mg via ORAL
  Filled 2014-08-10 (×5): qty 1

## 2014-08-10 MED ORDER — ALUM & MAG HYDROXIDE-SIMETH 200-200-20 MG/5ML PO SUSP
30.0000 mL | ORAL | Status: DC | PRN
  Administered 2014-08-13 – 2014-08-14 (×2): 30 mL via ORAL
  Filled 2014-08-10 (×2): qty 30

## 2014-08-10 SURGICAL SUPPLY — 61 items
BONE CEMENT PALACOSE (Orthopedic Implant) ×9 IMPLANT
BOWL SMART MIX CTS (DISPOSABLE) ×2 IMPLANT
BRUSH FEMORAL CANAL (MISCELLANEOUS) ×2 IMPLANT
CEMENT BONE PALACOSE (Orthopedic Implant) IMPLANT
COVER SURGICAL LIGHT HANDLE (MISCELLANEOUS) ×3 IMPLANT
DRAPE IMP U-DRAPE 54X76 (DRAPES) ×3 IMPLANT
DRAPE ORTHO SPLIT 77X108 STRL (DRAPES) ×6
DRAPE PROXIMA HALF (DRAPES) ×4 IMPLANT
DRAPE SURG ORHT 6 SPLT 77X108 (DRAPES) ×2 IMPLANT
DRAPE U-SHAPE 47X51 STRL (DRAPES) ×3 IMPLANT
DRSG AQUACEL AG ADV 3.5X14 (GAUZE/BANDAGES/DRESSINGS) ×2 IMPLANT
DRSG PAD ABDOMINAL 8X10 ST (GAUZE/BANDAGES/DRESSINGS) ×5 IMPLANT
DURAPREP 26ML APPLICATOR (WOUND CARE) ×3 IMPLANT
ELECT CAUTERY BLADE 6.4 (BLADE) ×2 IMPLANT
ELECT REM PT RETURN 9FT ADLT (ELECTROSURGICAL) ×3
ELECTRODE REM PT RTRN 9FT ADLT (ELECTROSURGICAL) IMPLANT
GAUZE SPONGE 4X4 12PLY STRL (GAUZE/BANDAGES/DRESSINGS) ×3 IMPLANT
GAUZE XEROFORM 5X9 LF (GAUZE/BANDAGES/DRESSINGS) ×3 IMPLANT
GLOVE BIO SURGEON STRL SZ7.5 (GLOVE) ×7 IMPLANT
GLOVE BIO SURGEON STRL SZ8.5 (GLOVE) ×3 IMPLANT
GLOVE BIOGEL M STRL SZ7.5 (GLOVE) ×9 IMPLANT
GLOVE BIOGEL PI IND STRL 7.5 (GLOVE) IMPLANT
GLOVE BIOGEL PI IND STRL 8 (GLOVE) ×1 IMPLANT
GLOVE BIOGEL PI IND STRL 9 (GLOVE) ×1 IMPLANT
GLOVE BIOGEL PI INDICATOR 7.5 (GLOVE) ×8
GLOVE BIOGEL PI INDICATOR 8 (GLOVE) ×8
GLOVE BIOGEL PI INDICATOR 9 (GLOVE) ×2
GLOVE ORTHO TXT STRL SZ7.5 (GLOVE) ×2 IMPLANT
GLOVE SURG SS PI 6.5 STRL IVOR (GLOVE) ×8 IMPLANT
GOWN STRL REUS W/ TWL LRG LVL3 (GOWN DISPOSABLE) ×1 IMPLANT
GOWN STRL REUS W/ TWL XL LVL3 (GOWN DISPOSABLE) ×1 IMPLANT
GOWN STRL REUS W/TWL LRG LVL3 (GOWN DISPOSABLE) ×3
GOWN STRL REUS W/TWL XL LVL3 (GOWN DISPOSABLE) ×3
HANDPIECE INTERPULSE COAX TIP (DISPOSABLE) ×3
KIT BASIN OR (CUSTOM PROCEDURE TRAY) ×3 IMPLANT
KIT ROOM TURNOVER OR (KITS) ×3 IMPLANT
MANIFOLD NEPTUNE II (INSTRUMENTS) ×3 IMPLANT
NS IRRIG 1000ML POUR BTL (IV SOLUTION) ×3 IMPLANT
PACK TOTAL JOINT (CUSTOM PROCEDURE TRAY) ×3 IMPLANT
PACK UNIVERSAL I (CUSTOM PROCEDURE TRAY) ×3 IMPLANT
PAD ARMBOARD 7.5X6 YLW CONV (MISCELLANEOUS) ×6 IMPLANT
SET HNDPC FAN SPRY TIP SCT (DISPOSABLE) IMPLANT
SPONGE LAP 18X18 X RAY DECT (DISPOSABLE) ×23 IMPLANT
SUCTION FRAZIER TIP 10 FR DISP (SUCTIONS) ×2 IMPLANT
SUT PDS AB 1 CT  36 (SUTURE)
SUT PDS AB 1 CT 36 (SUTURE) IMPLANT
SUT PDS AB 2-0 CT1 27 (SUTURE) ×4 IMPLANT
SUT VIC AB 0 CT1 27 (SUTURE) ×9
SUT VIC AB 0 CT1 27XBRD ANBCTR (SUTURE) ×1 IMPLANT
SUT VIC AB 1 CTX 36 (SUTURE) ×12
SUT VIC AB 1 CTX36XBRD ANBCTR (SUTURE) ×1 IMPLANT
SUT VIC AB 2-0 CT1 36 (SUTURE) ×3 IMPLANT
SYR CONTROL 10ML LL (SYRINGE) ×2 IMPLANT
TOWEL OR 17X24 6PK STRL BLUE (TOWEL DISPOSABLE) ×3 IMPLANT
TOWEL OR 17X26 10 PK STRL BLUE (TOWEL DISPOSABLE) ×5 IMPLANT
TUBE ANAEROBIC SPECIMEN COL (MISCELLANEOUS) ×2 IMPLANT
TUBE CONNECTING 12'X1/4 (SUCTIONS) ×1
TUBE CONNECTING 12X1/4 (SUCTIONS) ×1 IMPLANT
TUBING SUCTION BULK 100 FT (MISCELLANEOUS) ×4 IMPLANT
UNDERPAD 30X30 INCONTINENT (UNDERPADS AND DIAPERS) ×3 IMPLANT
WATER STERILE IRR 1000ML POUR (IV SOLUTION) ×3 IMPLANT

## 2014-08-10 NOTE — Anesthesia Procedure Notes (Signed)
Procedure Name: Intubation Date/Time: 08/10/2014 10:48 AM Performed by: Fransisca KaufmannMEYER, Idalia Allbritton E Pre-anesthesia Checklist: Patient identified, Emergency Drugs available, Suction available, Patient being monitored and Timeout performed Patient Re-evaluated:Patient Re-evaluated prior to inductionOxygen Delivery Method: Circle system utilized Preoxygenation: Pre-oxygenation with 100% oxygen Intubation Type: IV induction Ventilation: Mask ventilation without difficulty Laryngoscope Size: Mac and 3 Grade View: Grade II Tube type: Oral Tube size: 7.5 mm Number of attempts: 1 Airway Equipment and Method: Stylet Placement Confirmation: ETT inserted through vocal cords under direct vision,  positive ETCO2 and breath sounds checked- equal and bilateral Secured at: 23 cm Tube secured with: Tape Dental Injury: Teeth and Oropharynx as per pre-operative assessment

## 2014-08-10 NOTE — Op Note (Signed)
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 Placement of acetabular polymethylmethacrylate spacer approximately 2 batches of Palacos cement with 4 vials of tobramycin 4.8 g total and 3 g of Zinacef mixed into the cement. We also sent off for Gram stain and culture of synovial fluid and tissue.  Surgeon: Nestor LewandowskyFrank J Kawika Bischoff, MD   Assistant: Tomi LikensEric K. Gaylene Brookshillips PA-C (present throughout entire procedure and necessary for timely completion of the procedure)   Anesthesia: General   EBL: 800cc Fluids: 2500 crystalloid   Indications: S/p R Hip replacement on Oct 2012. Did well until 2014 when he developed a so as abscess that seated his right thigh and probably his right total hip. The so as abscess was percutaneously drained and grew out methicillin sensitive staph aureus the hip was irrigated and debrided with placement of drains and a received 6 weeks of IV Ancef and has been on oral cephalexin ever since then doing well until a few weeks ago when he developed some swelling to the distal aspect of his right total hip wound. We attempted to aspirate it 2 weeks ago and got no fluid back a few days ago the 1 cm nodule opened and he is had some purulent drainage. He is been afebrile denies any hip pain except for the very distal aspect of the wound the hip has a normal appearance. Gram stain from 2 days ago was a mixture of polys and macrophages cultures were negative at 48 hours. The drainage has increased. Plan is to explore the wound distally of tracks up towards the total hip performed radical irrigation and debridement and removal of implants which include a well fixed uncemented DePuy Pinnacle cup, cemented Summit stem femoral head cement and  cement plug. Risks and benefits of surgery discussed at length and the patient is prepared for surgical intervention.   Procedure: Patient identified by arm band and taken to operating room at [facility] appropriate, anesthetic monitors were attached and general endotracheal anesthesia induced with the patient in the supine position. Foley catheter was inserted he was rolled into the lateral decubitus position and R lower extremity prepped and draped in usual sterile fashion from the ankle to the hemipelvis. Time out procedure performed and we began the operation by re-creating the distal 5 cm of the old total hip incision we immediately encountered an abscess that did track proximally. The incision was extended proximally towards the greater trochanter through the skin and subcutaneous tissue and the purulent material was noted to track proximally and then went behind the femur into a sinus that did communicate with the hip. There is also phlegmon between the IT band and the vastus lateralis that was removed sharply with a #10 blade and the Bovie. Transexamic acid soaked sponge was used to help control hemostasis in addition to the Bovie. The skin sinus tract was also excised. The incision the IT band was extended proximally giving us full access to the hip the rest of the sinus tract was removed and it did going to the hip capsule. We then performed a radical synovectomy around the hip joint and this material was sent for Gram stain and culture. This exposed the constrained liner and locking ring the locking ring was removed  with a mallet and metal cylinder allowing [place] to dislocate the hip and removed the femoral head. Using the [facility] osteotomes we then worked our way around the femoral component and using a slap hammer extractor removed the femoral component without difficulty. This gave [place] further access to the constrained liner and the constrained portion was removed sharply with an osteotome  allowing [place] to place and Innomed osteotome curved at 62 mm around the acetabular component and remove it also removing more inflamed infected material. The posterior superior wall the acetabulum was noted to be deficient after removal of the component. The acetabulum was then thoroughly irrigated out with pulse lavage solution. We directed our attention back to the proximal femur and remove cement with the Moreland cement chisels down to the level of the tip of the old stem and the knee using the long slender drills starting at 4 mm drilled distally through the cement restrictor and expanded gradually up to a 15 mm reamer. This allowed [place] to remove the cement restrictor and almost all of the rest of the cement from the femur. The femur that was then pulse lavaged clean. A triple batch of Palacos cement was then mixed with 4.8 g of powdered tobramycin and 3 g of powdered Zinacef and made into a ball which was then placed in the acetabulum giving [place] a cement spacer that would also allude anti-biotics. After the cemented hardened medium Hemovacs were drains were placed in the wound, one drain in the femur and one drain just below the IT band. Posterior pseudocapsule were repaired back to the intertrochanteric crest through drill holes with #1 running bike suture the IT band closed with running #1 bike suture the subcutaneous tissue with 0 and 2-0 undyed Vicryls suture and the skin with running subcuticular 30 bike suture. A dressing of Aquasol was applied the patient was unclamped rolled supine awakened extubated and taken to the recovery without difficulty. A Foley catheter was inserted prior to awakening the patient his hemoglobin at the end of the case was noted to be 8.8 and at the beginning of the case was 10.0.

## 2014-08-10 NOTE — Anesthesia Preprocedure Evaluation (Addendum)
Anesthesia Evaluation  Patient identified by MRN, date of birth, ID band Patient awake    Reviewed: Allergy & Precautions, NPO status , Patient's Chart, lab work & pertinent test results  History of Anesthesia Complications Negative for: history of anesthetic complications  Airway Mallampati: II  TM Distance: >3 FB Neck ROM: Full    Dental  (+) Upper Dentures, Dental Advisory Given   Pulmonary neg pulmonary ROS,    Pulmonary exam normal       Cardiovascular hypertension, + dysrhythmias Atrial Fibrillation + pacemaker Rhythm:Irregular Rate:Normal     Neuro/Psych PSYCHIATRIC DISORDERS Anxiety Depression    GI/Hepatic negative GI ROS, Neg liver ROS,   Endo/Other  diabetesHypothyroidism   Renal/GU Renal disease     Musculoskeletal   Abdominal   Peds  Hematology   Anesthesia Other Findings   Reproductive/Obstetrics                            Anesthesia Physical Anesthesia Plan  ASA: III  Anesthesia Plan: General   Post-op Pain Management:    Induction:   Airway Management Planned: LMA  Additional Equipment:   Intra-op Plan:   Post-operative Plan: Extubation in OR  Informed Consent: I have reviewed the patients History and Physical, chart, labs and discussed the procedure including the risks, benefits and alternatives for the proposed anesthesia with the patient or authorized representative who has indicated his/her understanding and acceptance.   Dental advisory given  Plan Discussed with: CRNA, Anesthesiologist and Surgeon  Anesthesia Plan Comments:        Anesthesia Quick Evaluation

## 2014-08-10 NOTE — Progress Notes (Signed)
Utilization review completed.  L. J. Reene Harlacher RN, BSN, CM 

## 2014-08-10 NOTE — Transfer of Care (Signed)
Immediate Anesthesia Transfer of Care Note  Patient: [name]  Procedure(s) Performed: Procedure(s): IRRIGATION AND DEBRIDEMENT RIGHT HIP SUPERFICIAL VS DEEP (Right)  Patient Location: PACU  Anesthesia Type:General  Level of Consciousness: awake, alert , oriented and sedated  Airway & Oxygen Therapy: Patient Spontanous Breathing and Patient connected to nasal cannula oxygen  Post-op Assessment: Report given to RN, Post -op Vital signs reviewed and stable and Patient moving all extremities  Post vital signs: Reviewed and stable  Last Vitals:  Filed Vitals:   08/10/14 0856  BP: 169/87  Pulse: 63  Temp: 36.6 C  Resp: 18    Complications: No apparent anesthesia complications

## 2014-08-10 NOTE — Interval H&P Note (Signed)
History and Physical Interval Note:  08/10/2014 10:10 AM  [name]  has presented today for surgery, with the diagnosis of infected right total hip arthroplasty  The various methods of treatment have been discussed with the patient and family. After consideration of risks, benefits and other options for treatment, the patient has consented to  Procedure(s): IRRIGATION AND DEBRIDEMENT RIGHT HIP SUPERFICIAL VS DEEP (Right) as a surgical intervention .  The patient's history has been reviewed, patient examined, no change in status, stable for surgery.  I have reviewed the patient's chart and labs.  Questions were answered to the patient's satisfaction.     Nestor LewandowskyOWAN,Nickolai Rinks J

## 2014-08-10 NOTE — Consult Note (Signed)
[facility]    Date of Admission:  08/10/2014   Prolonged therapy with oral cephalexin and rifampin since 2014       Reason for Consult: Persistent right prosthetic hip infection    Referring Physician: Dr. Gean Birchwood  Principal Problem:   Infected prosthesis of right hip Active Problems:   Staphylococcus aureus bacteremia   Postoperative wound infection of right hip   . chlorhexidine  60 mL Topical Once  . HYDROmorphone      . methocarbamol      . oxyCODONE      . tranexamic acid (CYKLOKAPRON) topical -INTRAOP  2,000 mg Topical To OR    Recommendations: 1. IV cefazolin pending operative Gram stain and cultures   Assessment: I strongly suspect that this is due to persistent MSSA infection rather than a new superinfection. I will treat with IV cefazolin pending operative Gram stain and cultures. I will follow with you this weekend.    HPI: [name] is a 81 y.o. male who is well known to our service. He has been followed by my partner, Dr. Daiva Eves, who last saw him in May in our clinic. Here is Dr. [name]'s review of his history from that clinic note.  "81 y.o. male with Pacemaker, Right prosthetic hip, Laminectomy in January of 2014 admitted in August 2014 with right leg pain weakness fevers. He has been found to have MSSA bacteremia and a large retroperitoneal abscess that is adjacent to his prosthetic hip.  Orthopedics performed resection of Radical irrigation and debridement right total hip with removal of all synovium, revision of femoral head, revision of polyethylene liner placement On 09/02/12  TEE failed to show vegetations on valves or overtly on PM. PM is out. He returned to the hospital with dislocation of his hip. This was able to be reduced without surgical intervention. My partner Dr. Ninetta Lights saw the patient and noted his sedimentation rate and C-reactive protein is still be quite high. Patient completed a course of 8  weeks of cefazolin 2 g IV every 8 hours at the skilled nursing facility along with oral rifampin twice daily.   I changed him to oral Keflex when his IV antibiotics were stopped and he continued on high-dose Keflex with rifampin since then.   Unfortunately he suffered a fall at home this past week when he attempted to get up onto a scale to weigh himself and slipped. His right hip was again dislocated. It was placed back into position in the emergency room and under conscious sedation.   He was readmitted in late October 2014 and underwent  Irrigation and debridement of infected right total hip with removal of all synovium, revision of femoral head to a +5 40 mm metal head, revision of polyethylene liner to a constrained liner  He has remained on keflex  po tid since then but stopped the rifampin  His hip pain has dissapeared but he has now pain in the right leg itself and difficulty raising leg vs gravity though he can bear weight on it. He has significant atrophy in this leg now."  He continued on oral cephalexin and rifampin following that visit. He recently developed swelling along the distal right hip incision and it began to drain spontaneously. He was admitted and went to the OR today for incision and drainage and removal of all hardware.   Review of Systems: Review of systems not obtained due to patient  factors.  Past Medical History  Diagnosis Date  . Atrial fibrillation   . Hypertension   . Pacemaker   . Arthritis   . Anxiety   . Depression   . MSSA (methicillin susceptible Staphylococcus aureus) infection   . Bacteremia   . Infected pacemaker   . Septic hip   . Complication of anesthesia     " Terrible Nightmares"  . Dysrhythmia     Afib- had pacemaker, had it removed- takes Aspirin 325- daily  . Diabetes mellitus without complication     Type II  . Constipation   . History of blood product transfusion   . OAB (overactive bladder)   . CKD (chronic kidney  disease)   . Hypothyroidism     History  Substance Use Topics  . Smoking status: Never Smoker   . Smokeless tobacco: Never Used  . Alcohol Use: No    History reviewed. No pertinent family history. Allergies  Allergen Reactions  . Ambien [Zolpidem]     hallucinations    OBJECTIVE: Blood pressure 108/63, pulse 61, temperature 97.6 F (36.4 C), temperature source Oral, resp. rate 18, height 5\' 11"  (1.803 m), weight 234 lb (106.142 kg), SpO2 100 %. General: He is still in the recovery room.  Lab Results Lab Results  Component Value Date   WBC 7.3 08/10/2014   HGB 10.7* 08/10/2014   HCT 33.7* 08/10/2014   MCV 93.6 08/10/2014   PLT 179 08/10/2014    Lab Results  Component Value Date   CREATININE 1.69* 08/10/2014   BUN 20 08/10/2014   NA 142 08/10/2014   K 4.0 08/10/2014   CL 110 08/10/2014   CO2 25 08/10/2014    Lab Results  Component Value Date   ALT 104* 12/30/2012   AST 84* 12/30/2012   ALKPHOS 95 12/30/2012   BILITOT 0.6 12/30/2012     Microbiology: No results found for this or any previous visit (from the past 240 hour(s)).  Cliffton AstersJohn Sharonlee Nine, MD [facility] [phone] pager   [phone] cell 08/10/2014, 4:38 PM

## 2014-08-11 LAB — CBC
HEMATOCRIT: 27.4 % — AB (ref 39.0–52.0)
HEMOGLOBIN: 8.9 g/dL — AB (ref 13.0–17.0)
MCH: 30.5 pg (ref 26.0–34.0)
MCHC: 32.5 g/dL (ref 30.0–36.0)
MCV: 93.8 fL (ref 78.0–100.0)
Platelets: 146 10*3/uL — ABNORMAL LOW (ref 150–400)
RBC: 2.92 MIL/uL — AB (ref 4.22–5.81)
RDW: 14.5 % (ref 11.5–15.5)
WBC: 9.3 10*3/uL (ref 4.0–10.5)

## 2014-08-11 LAB — GLUCOSE, CAPILLARY
GLUCOSE-CAPILLARY: 138 mg/dL — AB (ref 65–99)
GLUCOSE-CAPILLARY: 189 mg/dL — AB (ref 65–99)
Glucose-Capillary: 124 mg/dL — ABNORMAL HIGH (ref 65–99)
Glucose-Capillary: 185 mg/dL — ABNORMAL HIGH (ref 65–99)

## 2014-08-11 LAB — HEMOGLOBIN A1C
Hgb A1c MFr Bld: 6.2 % — ABNORMAL HIGH (ref 4.8–5.6)
MEAN PLASMA GLUCOSE: 131 mg/dL

## 2014-08-11 NOTE — Progress Notes (Signed)
OT Cancellation Note  Patient Details Name: Charlene BrookeHarda Gramling MRN: [id number] DOB: [birth year]   Cancelled Treatment:    Reason Eval/Treat Not Completed: OT screened. Pt's current D/C plan is SNF. No apparent immediate acute care OT needs, therefore will defer OT to SNF. If OT eval is needed please call Acute Rehab Dept. at [phone] or text page OT at [phone].    Earlie RavelingStraub, Keiden Deskin L OTR/L [phone] 08/11/2014, 11:35 AM

## 2014-08-11 NOTE — Evaluation (Signed)
Physical Therapy Evaluation Patient Details Name: Charlene BrookeHarda Brosnahan MRN: [id number] DOB: 01-Apr-1930 Today's Date: 08/11/2014   History of Present Illness  Pt is a 81 y/o M s/p removal of parts and girldestone procedure due to R hip infection.  Pt had R prostehtic hip Jan 2014 which became infected Aug 2014 followed by revision and I and D.  His R hip dislocated x2 and it again became infected.  Pt's PMH includes a fib, HTN, pacemaker, anxiety and depression, DMII, lumbar laminectomy.  Clinical Impression  Patient is s/p above surgery resulting in functional limitations due to the deficits listed below (see PT Problem List). Pt very anxious about falling during sit>stand which impacted his ability to perform stand pivot which was not attempted.  Mod+2 assist bed mobility and sit<>stand. Pt may benefit from use of Stedy to perform sit>stand to get to recliner chair next session if pt unable to do so w/ assist.  Patient will benefit from skilled PT to increase their independence and safety with mobility to allow discharge to the venue listed below.      Follow Up Recommendations SNF;Supervision/Assistance - 24 hour    Equipment Recommendations  Other (comment) (TBD by next venue of care)    Recommendations for Other Services       Precautions / Restrictions Precautions Precautions: Posterior Hip;Fall Precaution Booklet Issued: Yes (comment) Precaution Comments: Pt's hip has dislocated x2 in the past.  Pt remembered 2/3 hip precautions (omitting IR) at end of session using teachback. Restrictions Weight Bearing Restrictions: Yes RLE Weight Bearing: Touchdown weight bearing      Mobility  Bed Mobility Overal bed mobility: Needs Assistance;+2 for physical assistance Bed Mobility: Supine to Sit;Sit to Supine     Supine to sit: Mod assist;+2 for physical assistance;HOB elevated Sit to supine: Mod assist;+2 for physical assistance   General bed mobility comments: Mod +2 assist managing  BLEs and trunk posteriorly during supine<>sit.  Heavy use of rails by pt to pull/push up to sitting and VCs for technique.  Use of bed pad to scoot hips to EOB.    Transfers Overall transfer level: Needs assistance Equipment used: Rolling walker (2 wheeled) Transfers: Sit to/from Stand Sit to Stand: Mod assist;+2 physical assistance;From elevated surface         General transfer comment: Mod+2 assist to maintain balance and to power up from standing.  Pt very anxious about falling and does not stand up straight despite verbal cues, saying he needs to sit down.    Ambulation/Gait                Stairs            Wheelchair Mobility    Modified Rankin (Stroke Patients Only)       Balance Overall balance assessment: Needs assistance Sitting-balance support: Bilateral upper extremity supported;Feet supported Sitting balance-Leahy Scale: Fair     Standing balance support: Bilateral upper extremity supported;During functional activity Standing balance-Leahy Scale: Poor                               Pertinent Vitals/Pain Pain Assessment: 0-10 Pain Score: 5  Pain Location: R hip Pain Descriptors / Indicators: Aching;Stabbing;Sore Pain Intervention(s): Limited activity within patient's tolerance;Monitored during session;Repositioned;Utilized relaxation techniques    Home Living Family/patient expects to be discharged to:: Skilled nursing facility                 Additional Comments: Pt  lives w/ wife at home    Prior Function Level of Independence: Independent               Hand Dominance        Extremity/Trunk Assessment               Lower Extremity Assessment: RLE deficits/detail;Generalized weakness RLE Deficits / Details: weakness and limited ROM s/p R hip surgery       Communication   Communication: No difficulties  Cognition Arousal/Alertness: Awake/alert Behavior During Therapy: Anxious Overall Cognitive  Status: Within Functional Limits for tasks assessed                      General Comments      Exercises Total Joint Exercises Ankle Circles/Pumps: AROM;Both;15 reps;Supine Quad Sets: AROM;Both;10 reps;Supine      Assessment/Plan    PT Assessment Patient needs continued PT services  PT Diagnosis Difficulty walking;Abnormality of gait;Generalized weakness;Acute pain   PT Problem List Decreased strength;Decreased range of motion;Decreased activity tolerance;Decreased balance;Decreased mobility;Decreased coordination;Decreased knowledge of use of DME;Decreased safety awareness;Decreased knowledge of precautions;Cardiopulmonary status limiting activity;Obesity;Decreased skin integrity;Pain  PT Treatment Interventions DME instruction;Gait training;Stair training;Functional mobility training;Therapeutic activities;Therapeutic exercise;Balance training;Neuromuscular re-education;Patient/family education;Modalities   PT Goals (Current goals can be found in the Care Plan section) Acute Rehab PT Goals Patient Stated Goal: to be able to stand up and walk again PT Goal Formulation: With patient Time For Goal Achievement: 08/18/14 Potential to Achieve Goals: Good    Frequency Min 5X/week   Barriers to discharge        Co-evaluation               End of Session Equipment Utilized During Treatment: Gait belt Activity Tolerance: Patient limited by pain;Other (comment) (limited by anxiety) Patient left: in bed;with call bell/phone within reach;with SCD's reapplied Nurse Communication: Mobility status;Precautions;Weight bearing status;Need for lift equipment         Time: 2956-2130 PT Time Calculation (min) (ACUTE ONLY): 39 min   Charges:   PT Evaluation $Initial PT Evaluation Tier I: 1 Procedure PT Treatments $Therapeutic Exercise: 8-22 mins $Therapeutic Activity: 8-22 mins   PT G Codes:       Michail Jewels PT, DPT [phone] Pager: [phone] 08/11/2014, 11:11  AM

## 2014-08-11 NOTE — Clinical Social Work Placement (Signed)
   CLINICAL SOCIAL WORK PLACEMENT  NOTE  Date:  08/11/2014  Patient Details  Name: Joshua BrookeHarda [name] MRN: [id number] Date of Birth: [birth year]  Clinical Social Work is seeking post-discharge placement for this patient at the Skilled  Nursing Facility level of care (*CSW will initial, date and re-position this form in  chart as items are completed):  Yes   Patient/family provided with [facility] Clinical Social Work Department's list of facilities offering this level of care within the geographic area requested by the patient (or if unable, by the patient's family).  Yes   Patient/family informed of their freedom to choose among providers that offer the needed level of care, that participate in Medicare, Medicaid or managed care program needed by the patient, have an available bed and are willing to accept the patient.  Yes   Patient/family informed of [facility]'s ownership interest in [facility] and [facility], as well as of the fact that they are under no obligation to receive care at these facilities.  PASRR submitted to EDS on       PASRR number received on       Existing PASRR number confirmed on 08/11/14     FL2 transmitted to all facilities in geographic area requested by pt/family on 08/11/14     FL2 transmitted to all facilities within larger geographic area on       Patient informed that his/her managed care company has contracts with or will negotiate with certain facilities, including the following:            Patient/family informed of bed offers received.  Patient chooses bed at       Physician recommends and patient chooses bed at      Patient to be transferred to   on  .  Patient to be transferred to facility by       Patient family notified on   of transfer.  Name of family member notified:        PHYSICIAN Please prepare priority discharge summary, including medications, Please sign FL2, Please prepare prescriptions     Additional Comment:     _______________________________________________ Sharol HarnessPoonum Lamekia Nolden, LCSWA Weekend CSW [phone]

## 2014-08-11 NOTE — Clinical Social Work Note (Signed)
Clinical Social Work Assessment  Patient Details  Name: [name] MRN: [id number] Date of Birth: [birth year]  Date of referral:  08/11/14               Reason for consult:  Facility Placement                Permission sought to share information with:  Facility Industrial/product designerContact Representative Permission granted to share information::  Yes, Verbal Permission Granted  Name::        Agency::  SNFs for referral purpsoes  Relationship::     Contact Information:     Housing/Transportation Living arrangements for the past 2 months:  Single Family Home Source of Information:  Patient, Spouse Patient Interpreter Needed:  None Criminal Activity/Legal Involvement Pertinent to Current Situation/Hospitalization:  No - Comment as needed Significant Relationships:  Spouse Lives with:  Spouse Do you feel safe going back to the place where you live?  Yes Need for family participation in patient care:  Yes (Comment) (Pt requesting CSW speak to his wife because he may forget parts of conversation)  Care giving concerns:  Pt requiring assistance with mobility   Social Worker assessment / plan:  CSW visited pt room to discuss SNF. Pt and pt wife aware of recommendation and agreeable to plan. CSW explained SNF referral process and pt agreeable to [place] [facility]. Pt and pt [facility].  Employment status:  Retired Database administratornsurance information:  Managed Medicare PT Recommendations:  Skilled Nursing Facility Information / Referral to community resources:  Skilled Nursing Facility  Patient/Family's Response to care:  Pt and pt wife agreeable and understanding of plan  Patient/Family's Understanding of and Emotional Response to Diagnosis, Current Treatment, and Prognosis:  Pt and pt wife have a good understanding of condition. Appropriate emotional response.  Emotional Assessment Appearance:  Appears stated age,  Well-Groomed Attitude/Demeanor/Rapport:  Other (Cooperative) Affect (typically observed):  Happy, Pleasant Orientation:  Oriented to Self, Oriented to Place, Oriented to  Time, Oriented to Situation Alcohol / Substance use:  Not Applicable Psych involvement (Current and /or in the community):  No (Comment)  Discharge Needs  Concerns to be addressed:  Discharge Planning Concerns Readmission within the last 30 days:  No Current discharge risk:  Dependent with Mobility Barriers to Discharge:  Continued Medical Work up   H&R BlockPoonum Meleah Demeyer, Theresia MajorsLCSWA Weekend CSW [phone]

## 2014-08-11 NOTE — Progress Notes (Signed)
Patient ID: [name], male   DOB: [birth year], 81 y.o.   MRN: [id number]         [facility]    Date of Admission:  08/10/2014           Prolonged suppressive antibiotic therapy        Day day 1 cefazolin  Principal Problem:   Infected prosthesis of right hip Active Problems:   Staphylococcus aureus bacteremia   Postoperative wound infection of right hip   . sodium chloride   Intravenous Once  . aspirin EC  325 mg Oral Q breakfast  .  ceFAZolin (ANCEF) IV  1 g Intravenous Q12H  . diltiazem  120 mg Oral Daily  . docusate sodium  100 mg Oral BID  . insulin aspart  0-15 Units Subcutaneous TID WC  . levothyroxine  88 mcg Oral QAC breakfast  . meloxicam  15 mg Oral Daily  . metoprolol succinate  12.5 mg Oral Daily  . mirabegron ER  50 mg Oral Daily  . pioglitazone  15 mg Oral Daily  . potassium chloride SA  20 mEq Oral Daily  . pravastatin  40 mg Oral q1800  . terazosin  5 mg Oral QHS  . torsemide  20 mg Oral Daily    Subjective: He denies significant pain.  Review of Systems: Pertinent items are noted in HPI.  Past Medical History  Diagnosis Date  . Atrial fibrillation   . Hypertension   . Pacemaker   . Arthritis   . Anxiety   . Depression   . MSSA (methicillin susceptible Staphylococcus aureus) infection   . Bacteremia   . Infected pacemaker   . Septic hip   . Complication of anesthesia     " Terrible Nightmares"  . Dysrhythmia     Afib- had pacemaker, had it removed- takes Aspirin 325- daily  . Diabetes mellitus without complication     Type II  . Constipation   . History of blood product transfusion   . OAB (overactive bladder)   . CKD (chronic kidney disease)   . Hypothyroidism     History  Substance Use Topics  . Smoking status: Never Smoker   . Smokeless tobacco: Never Used  . Alcohol Use: No    History reviewed. No pertinent family history. Allergies  Allergen Reactions  . Ambien [Zolpidem]    hallucinations    OBJECTIVE: Blood pressure 125/66, pulse 84, temperature 98 F (36.7 C), temperature source Oral, resp. rate 17, height  (1.803 m), weight 234 lb (106.142 kg), SpO2 97 %. General: he is alert and in good spirits Hemovac drain and right hip incision  Lab Results Lab Results  Component Value Date   WBC 9.3 08/11/2014   HGB 8.9* 08/11/2014   HCT 27.4* 08/11/2014   MCV 93.8 08/11/2014   PLT 146* 08/11/2014    Lab Results  Component Value Date   CREATININE 1.69* 08/10/2014   BUN 20 08/10/2014   NA 142 08/10/2014   K 4.0 08/10/2014   CL 110 08/10/2014   CO2 25 08/10/2014    Lab Results  Component Value Date   ALT 104* 12/30/2012   AST 84* 12/30/2012   ALKPHOS 95 12/30/2012   BILITOT 0.6 12/30/2012     Microbiology: Recent Results (from the past 240 hour(s))  Anaerobic culture     Status: None (Preliminary result)   Collection Time: 08/10/14  1:51 PM  Result Value Ref Range Status   Specimen Description  TISSUE HIP RIGHT  Final   Special Requests NONE  Final   Gram Stain   Final    RARE WBC PRESENT,BOTH PMN AND MONONUCLEAR NO ORGANISMS SEEN Performed at [facility]    Culture   Final    NO ANAEROBES ISOLATED; CULTURE IN PROGRESS FOR 5 DAYS Performed at [facility]    Report Status PENDING  Incomplete  Tissue culture     Status: None (Preliminary result)   Collection Time: 08/10/14  1:51 PM  Result Value Ref Range Status   Specimen Description TISSUE HIP RIGHT  Final   Special Requests PATIENT ON FOLLOWING VANCOMYCIN  Final   Gram Stain   Final    RARE WBC PRESENT,BOTH PMN AND MONONUCLEAR NO ORGANISMS SEEN Performed at [facility]    Culture PENDING  Incomplete   Report Status PENDING  Incomplete    Assessment: I suspect that his recent worsening was caused by a flare of his chronic MSSA septic arthritis. I'm hopeful that with all hardware out now he will respond more favorably to antibiotic  therapy.  Plan: 1. Continue cefazolin pending operative culture results 2. I will followup tomorrow if cultures require any change in antibiotics, if not Dr. Luciana Axeomer will followup on Monday, 08/13/2014  Cliffton AstersJohn Tytiana Coles, MD [facility] [phone] pager   [phone] cell 08/11/2014, 4:33 PM

## 2014-08-11 NOTE — Progress Notes (Signed)
PATIENT ID: [name]  MRN: [id number]  DOB/AGE:  10-21-1930 / 81 y.o.  1 Day Post-Op Procedure(s) (LRB): IRRIGATION AND DEBRIDEMENT RIGHT HIP SUPERFICIAL VS DEEP (Right)       PROGRESS NOTE Subjective: Patient is alert, oriented, no Nausea, no Vomiting, yes passing gas, no Bowel Movement. Taking PO well. Denies SOB, Chest or Calf Pain. Using Incentive Spirometer, PAS in place. Patient reports pain as moderate  .    Objective: Vital signs in last 24 hours: Filed Vitals:   08/11/14 0103 08/11/14 0128 08/11/14 0432 08/11/14 0529  BP: 129/59 128/65 123/57   Pulse: 91 94 85 81  Temp: 98.9 F (37.2 C) 99 F (37.2 C) 99.3 F (37.4 C)   TempSrc: Oral Oral Oral   Resp: 18 18 16 17   Height:      Weight:      SpO2: 100% 94% 99% 100%      Intake/Output from previous day: I/O last 3 completed shifts: In: 4185 [I.V.:3350; Blood:335; IV Piggyback:500] Out: 2455 [Urine:1500; Drains:255; Blood:700]   Intake/Output this shift:     LABORATORY DATA:  Recent Labs  08/10/14 0924  08/10/14 1442 08/10/14 1854 08/10/14 2133 08/11/14 0710  WBC 7.3  --   --   --   --   --   HGB 10.7*  --   --  7.9*  --   --   HCT 33.7*  --   --  24.5*  --   --   PLT 179  --   --   --   --   --   NA 142  --   --   --   --   --   K 4.0  --   --   --   --   --   CL 110  --   --   --   --   --   CO2 25  --   --   --   --   --   BUN 20  --   --   --   --   --   CREATININE 1.69*  --   --   --   --   --   GLUCOSE 90  --   --   --   --   --   GLUCAP  --   < > 114*  --  148* 138*  CALCIUM 8.7*  --   --   --   --   --   < > = values in this interval not displayed.  Examination: Neurologically intact Neurovascular intact Sensation intact distally Intact pulses distally Dorsiflexion/Plantar flexion intact Incision: moderate drainage No cellulitis present Compartment soft} XR AP&Lat of hip shows well placed\fixed THA  Assessment:   1 Day Post-Op Procedure(s) (LRB): IRRIGATION AND DEBRIDEMENT  RIGHT HIP SUPERFICIAL VS DEEP (Right)   Pt needed removal of parts and girdle stone procedure due to hip infection,  We have consulted ID and the pt is currently on IV cefazolin.    ADDITIONAL DIAGNOSIS:  Expected Acute Blood Loss Anemia, Acute Blood Loss Anemia, Diabetes, Hypertension and Renal Insufficiency Chronic  Plan: PT/OT WBAT on left leg and touch toe on the right   DVT Prophylaxis: SCDx72 hrs, ASA 325 mg BID x 2 weeks  DISCHARGE PLAN: Skilled Nursing Facility/Rehab  DISCHARGE NEEDS: HHPT, HHRN, Walker and 3-in-1 comode seat

## 2014-08-12 LAB — CBC
HEMATOCRIT: 23.4 % — AB (ref 39.0–52.0)
HEMATOCRIT: 31.7 % — AB (ref 39.0–52.0)
HEMOGLOBIN: 10.3 g/dL — AB (ref 13.0–17.0)
Hemoglobin: 7.8 g/dL — ABNORMAL LOW (ref 13.0–17.0)
MCH: 31 pg (ref 26.0–34.0)
MCH: 30.7 pg (ref 26.0–34.0)
MCHC: 33.3 g/dL (ref 30.0–36.0)
MCHC: 32.5 g/dL (ref 30.0–36.0)
MCV: 92.9 fL (ref 78.0–100.0)
MCV: 94.6 fL (ref 78.0–100.0)
Platelets: 129 10*3/uL — ABNORMAL LOW (ref 150–400)
Platelets: 141 10*3/uL — ABNORMAL LOW (ref 150–400)
RBC: 2.52 MIL/uL — ABNORMAL LOW (ref 4.22–5.81)
RBC: 3.35 MIL/uL — AB (ref 4.22–5.81)
RDW: 14.2 % (ref 11.5–15.5)
RDW: 14.6 % (ref 11.5–15.5)
WBC: 12.5 10*3/uL — ABNORMAL HIGH (ref 4.0–10.5)
WBC: 13.8 10*3/uL — ABNORMAL HIGH (ref 4.0–10.5)

## 2014-08-12 LAB — GLUCOSE, CAPILLARY
GLUCOSE-CAPILLARY: 129 mg/dL — AB (ref 65–99)
Glucose-Capillary: 169 mg/dL — ABNORMAL HIGH (ref 65–99)
Glucose-Capillary: 156 mg/dL — ABNORMAL HIGH (ref 65–99)
Glucose-Capillary: 142 mg/dL — ABNORMAL HIGH (ref 65–99)

## 2014-08-12 LAB — PREPARE RBC (CROSSMATCH)

## 2014-08-12 MED ORDER — SODIUM CHLORIDE 0.9 % IV SOLN
Freq: Once | INTRAVENOUS | Status: AC
Start: 2014-08-12 — End: 2014-08-12
  Administered 2014-08-12: 11:00:00 via INTRAVENOUS

## 2014-08-12 NOTE — Progress Notes (Signed)
PATIENT ID: Joshua BrookeHarda Fragoso  MRN: [id number]  DOB/AGE:  79-16-1932 / 79 y.o.  2 Days Post-Op Procedure(s) (LRB): IRRIGATION AND DEBRIDEMENT RIGHT HIP SUPERFICIAL VS DEEP (Right)    PROGRESS NOTE Subjective: Patient is alert, oriented, no Nausea, no Vomiting, yes passing gas, no Bowel Movement. Taking PO with pt eating advancing diet. Denies SOB, Chest or Calf Pain. Using Incentive Spirometer, PAS in place.  Patient reports pain as mild and moderate  .    Objective: Vital signs in last 24 hours: Filed Vitals:   08/11/14 0933 08/11/14 1358 08/11/14 2016 08/12/14 0536  BP: 122/52 125/66 135/65 124/56  Pulse: 85 84 87 66  Temp:  98 F (36.7 C) 98 F (36.7 C) 98.2 F (36.8 C)  TempSrc:   Oral Oral  Resp:  17 18 16   Height:      Weight:      SpO2: 96% 97% 99% 98%      Intake/Output from previous day: I/O last 3 completed shifts: In: 2875 [P.O.:240; I.V.:2250; Blood:335; IV Piggyback:50] Out: 2205 [Urine:2100; Drains:105]   Intake/Output this shift:     LABORATORY DATA:  Recent Labs  08/10/14 0924  08/11/14 1345 08/11/14 1613 08/11/14 2108 08/12/14 0455 08/12/14 0621  WBC 7.3  --  9.3  --   --  12.5*  --   HGB 10.7*  < > 8.9*  --   --  7.8*  --   HCT 33.7*  < > 27.4*  --   --  23.4*  --   PLT 179  --  146*  --   --  129*  --   NA 142  --   --   --   --   --   --   K 4.0  --   --   --   --   --   --   CL 110  --   --   --   --   --   --   CO2 25  --   --   --   --   --   --   BUN 20  --   --   --   --   --   --   CREATININE 1.69*  --   --   --   --   --   --   GLUCOSE 90  --   --   --   --   --   --   GLUCAP  --   < >  --  185* 189*  --  169*  CALCIUM 8.7*  --   --   --   --   --   --   < > = values in this interval not displayed.  Examination: Neurologically intact Neurovascular intact Sensation intact distally Intact pulses distally Dorsiflexion/Plantar flexion intact Incision: dressing C/D/I No cellulitis present Compartment soft} XR AP&Lat of hip shows  well placed\fixed THA  Assessment:   2 Days Post-Op Procedure(s) (LRB): IRRIGATION AND DEBRIDEMENT RIGHT HIP SUPERFICIAL VS DEEP (Right) ADDITIONAL DIAGNOSIS:  Expected Acute Blood Loss Anemia, Acute Blood Loss Anemia, Diabetes, Hypertension and Renal Insufficiency Chronic  Plan: PT/OT WBAT on left leg and touch toe on the right leg  DVT Prophylaxis: SCDx72 hrs, ASA 325 mg BID x 2 weeks  DISCHARGE PLAN: Skilled Nursing Facility/Rehab  DISCHARGE NEEDS: HHPT, HHRN, Walker and 3-in-1 comode seat   Plan to transfuse 2 units as hgb is 7.8

## 2014-08-13 ENCOUNTER — Encounter (HOSPITAL_COMMUNITY): Payer: Self-pay | Admitting: Orthopedic Surgery

## 2014-08-13 DIAGNOSIS — Z96641 Presence of right artificial hip joint: Secondary | ICD-10-CM

## 2014-08-13 LAB — TYPE AND SCREEN
ABO/RH(D): A POS
ANTIBODY SCREEN: NEGATIVE
UNIT DIVISION: 0
Unit division: 0
Unit division: 0
Unit division: 0

## 2014-08-13 LAB — CBC
HCT: 26.5 % — ABNORMAL LOW (ref 39.0–52.0)
HEMOGLOBIN: 8.8 g/dL — AB (ref 13.0–17.0)
MCH: 30.9 pg (ref 26.0–34.0)
MCHC: 33.2 g/dL (ref 30.0–36.0)
MCV: 93 fL (ref 78.0–100.0)
PLATELETS: 132 10*3/uL — AB (ref 150–400)
RBC: 2.85 MIL/uL — ABNORMAL LOW (ref 4.22–5.81)
RDW: 14.8 % (ref 11.5–15.5)
WBC: 8.7 10*3/uL (ref 4.0–10.5)

## 2014-08-13 LAB — POCT I-STAT 4, (NA,K, GLUC, HGB,HCT)
Glucose, Bld: 88 mg/dL (ref 65–99)
Glucose, Bld: 112 mg/dL — ABNORMAL HIGH (ref 65–99)
HCT: 28 % — ABNORMAL LOW (ref 39.0–52.0)
HEMATOCRIT: 26 % — AB (ref 39.0–52.0)
HEMOGLOBIN: 9.5 g/dL — AB (ref 13.0–17.0)
Hemoglobin: 8.8 g/dL — ABNORMAL LOW (ref 13.0–17.0)
POTASSIUM: 4.2 mmol/L (ref 3.5–5.1)
POTASSIUM: 4.3 mmol/L (ref 3.5–5.1)
Sodium: 143 mmol/L (ref 135–145)
Sodium: 142 mmol/L (ref 135–145)

## 2014-08-13 LAB — GLUCOSE, CAPILLARY
GLUCOSE-CAPILLARY: 107 mg/dL — AB (ref 65–99)
Glucose-Capillary: 102 mg/dL — ABNORMAL HIGH (ref 65–99)
Glucose-Capillary: 117 mg/dL — ABNORMAL HIGH (ref 65–99)
Glucose-Capillary: 127 mg/dL — ABNORMAL HIGH (ref 65–99)

## 2014-08-13 MED ORDER — OXYCODONE-ACETAMINOPHEN 5-325 MG PO TABS: 1.0000 | ORAL_TABLET | ORAL | Status: DC | PRN

## 2014-08-13 MED ORDER — TIZANIDINE HCL 2 MG PO TABS: 2.0000 mg | ORAL_TABLET | Freq: Four times a day (QID) | ORAL | Status: DC | PRN

## 2014-08-13 MED ORDER — ASPIRIN EC 325 MG PO TBEC: 325.0000 mg | DELAYED_RELEASE_TABLET | Freq: Two times a day (BID) | ORAL | Status: DC

## 2014-08-13 NOTE — Progress Notes (Signed)
[facility] for Infectious Disease  Date of Admission:  08/10/2014  Antibiotics: cefazolin  Subjective: Feels well, confused overnight  Objective: Temp:  [97.7 F (36.5 C)-98 F (36.7 C)] 97.8 F (36.6 C) (07/18 1300) Pulse Rate:  [71-100] 72 (07/18 1300) Resp:  [17-19] 17 (07/18 0631) BP: (125-132)/(63-81) 132/64 mmHg (07/18 1300) SpO2:  [98 %-100 %] 100 % (07/18 1300)  General: awake, alert Skin: no rashes Lungs: CTA B Cor: RRR Abdomen: soft, nt Ext; hip with some serosanguinous drainage  Lab Results Lab Results  Component Value Date   WBC 8.7 08/13/2014   HGB 8.8* 08/13/2014   HCT 26.5* 08/13/2014   MCV 93.0 08/13/2014   PLT 132* 08/13/2014    Lab Results  Component Value Date   CREATININE 1.69* 08/10/2014   BUN 20 08/10/2014   NA 142 08/10/2014   K 4.3 08/10/2014   CL 110 08/10/2014   CO2 25 08/10/2014    Lab Results  Component Value Date   ALT 104* 12/30/2012   AST 84* 12/30/2012   ALKPHOS 95 12/30/2012   BILITOT 0.6 12/30/2012      Microbiology: Recent Results (from the past 240 hour(s))  Anaerobic culture     Status: None (Preliminary result)   Collection Time: 08/10/14  1:51 PM  Result Value Ref Range Status   Specimen Description TISSUE HIP RIGHT  Final   Special Requests NONE  Final   Gram Stain   Final    RARE WBC PRESENT,BOTH PMN AND MONONUCLEAR NO ORGANISMS SEEN Performed at [facility]    Culture   Final    NO ANAEROBES ISOLATED; CULTURE IN PROGRESS FOR 5 DAYS Performed at [facility]    Report Status PENDING  Incomplete  Tissue culture     Status: None (Preliminary result)   Collection Time: 08/10/14  1:51 PM  Result Value Ref Range Status   Specimen Description TISSUE HIP RIGHT  Final   Special Requests PATIENT ON FOLLOWING VANCOMYCIN  Final   Gram Stain   Final    RARE WBC PRESENT,BOTH PMN AND MONONUCLEAR NO ORGANISMS SEEN Performed at [facility]    Culture   Final    NO GROWTH 1  DAY Performed at [facility]    Report Status PENDING  Incomplete    Studies/Results: No results found.  Assessment/Plan:  1) chronic right hip intraarticular infection s/p I and D with removal of hardware, synovium and spacer placement - no new growth to suggest different organism.  On cefazolin.  Will need prolonged course of 4 weeks (or longer).   ESR 62 CRP 1.7 Will place picc line Cefazolin per home health or SNF protocol Weekly cbc, cmp We will arrange follow up with Dr. Tommy Medal in 2-3 weeks  Scharlene Gloss, [place] for Infectious Disease [place] [email or web address] [phone] pager   [phone] cell 08/13/2014, 3:50 PM

## 2014-08-13 NOTE — Discharge Instructions (Signed)
INSTRUCTIONS AFTER JOINT REPLACEMENT   o Remove items at home which could result in a fall. This includes throw rugs or furniture in walking pathways o ICE to the affected joint every three hours while awake for 30 minutes at a time, for at least the first 3-5 days, and then as needed for pain and swelling.  Continue to use ice for pain and swelling. You may notice swelling that will progress down to the foot and ankle.  This is normal after surgery.  Elevate your leg when you are not up walking on it.   o Continue to use the breathing machine you got in the hospital (incentive spirometer) which will help keep your temperature down.  It is common for your temperature to cycle up and down following surgery, especially at night when you are not up moving around and exerting yourself.  The breathing machine keeps your lungs expanded and your temperature down.   DIET:  As you were doing prior to hospitalization, we recommend a well-balanced diet.  DRESSING / WOUND CARE / SHOWERING  Keep the surgical dressing until follow up.  The dressing is water proof, so you can shower without any extra covering.  IF THE DRESSING FALLS OFF or the wound gets wet inside, change the dressing with sterile gauze.  Please use good hand washing techniques before changing the dressing.  Do not use any lotions or creams on the incision until instructed by your surgeon.    ACTIVITY  o Increase activity slowly as tolerated, but follow the weight bearing instructions below.   o No driving for 6 weeks or until further direction given by your physician.  You cannot drive while taking narcotics.  o No lifting or carrying greater than 10 lbs. until further directed by your surgeon. o Avoid periods of inactivity such as sitting longer than an hour when not asleep. This helps prevent blood clots.  o You may return to work once you are authorized by your doctor.     WEIGHT BEARING   Partial weight bearing with assist device as  directed.  WBAT on left leg and touch toe weight bearing on the right   EXERCISES  Results after joint replacement surgery are often greatly improved when you follow the exercise, range of motion and muscle strengthening exercises prescribed by your doctor. Safety measures are also important to protect the joint from further injury. Any time any of these exercises cause you to have increased pain or swelling, decrease what you are doing until you are comfortable again and then slowly increase them. If you have problems or questions, call your caregiver or physical therapist for advice.   Rehabilitation is important following a joint replacement. After just a few days of immobilization, the muscles of the leg can become weakened and shrink (atrophy).  These exercises are designed to build up the tone and strength of the thigh and leg muscles and to improve motion. Often times heat used for twenty to thirty minutes before working out will loosen up your tissues and help with improving the range of motion but do not use heat for the first two weeks following surgery (sometimes heat can increase post-operative swelling).   These exercises can be done on a training (exercise) mat, on the floor, on a table or on a bed. Use whatever works the best and is most comfortable for you.    Use music or television while you are exercising so that the exercises are a pleasant break in  your day. This will make your life better with the exercises acting as a break in your routine that you can look forward to.   Perform all exercises about fifteen times, three times per day or as directed.  You should exercise both the operative leg and the other leg as well.  Exercises include:    Quad Sets - Tighten up the muscle on the front of the thigh (Quad) and hold for 5-10 seconds.    Straight Leg Raises - With your knee straight (if you were given a brace, keep it on), lift the leg to 60 degrees, hold for 3 seconds, and  slowly lower the leg.  Perform this exercise against resistance later as your leg gets stronger.   Leg Slides: Lying on your back, slowly slide your foot toward your buttocks, bending your knee up off the floor (only go as far as is comfortable). Then slowly slide your foot back down until your leg is flat on the floor again.   Angel Wings: Lying on your back spread your legs to the side as far apart as you can without causing discomfort.   Hamstring Strength:  Lying on your back, push your heel against the floor with your leg straight by tightening up the muscles of your buttocks.  Repeat, but this time bend your knee to a comfortable angle, and push your heel against the floor.  You may put a pillow under the heel to make it more comfortable if necessary.   A rehabilitation program following joint replacement surgery can speed recovery and prevent re-injury in the future due to weakened muscles. Contact your doctor or a physical therapist for more information on knee rehabilitation.    CONSTIPATION  Constipation is defined medically as fewer than three stools per week and severe constipation as less than one stool per week.  Even if you have a regular bowel pattern at home, your normal regimen is likely to be disrupted due to multiple reasons following surgery.  Combination of anesthesia, postoperative narcotics, change in appetite and fluid intake all can affect your bowels.   YOU MUST use at least one of the following options; they are listed in order of increasing strength to get the job done.  They are all available over the counter, and you may need to use some, POSSIBLY even all of these options:    Drink plenty of fluids (prune juice may be helpful) and high fiber foods Colace 100 mg by mouth twice a day  Senokot for constipation as directed and as needed Dulcolax (bisacodyl), take with full glass of water  Miralax (polyethylene glycol) once or twice a day as needed.  If you have tried  all these things and are unable to have a bowel movement in the first 3-4 days after surgery call either your surgeon or your primary doctor.    If you experience loose stools or diarrhea, hold the medications until you stool forms back up.  If your symptoms do not get better within 1 week or if they get worse, check with your doctor.  If you experience "the worst abdominal pain ever" or develop nausea or vomiting, please contact the office immediately for further recommendations for treatment.   ITCHING:  If you experience itching with your medications, try taking only a single pain pill, or even half a pain pill at a time.  You can also use Benadryl over the counter for itching or also to help with sleep.   TED HOSE  STOCKINGS:  Use stockings on both legs until for at least 2 weeks or as directed by physician office. They may be removed at night for sleeping.  MEDICATIONS:  See your medication summary on the After Visit Summary that nursing will review with you.  You may have some home medications which will be placed on hold until you complete the course of blood thinner medication.  It is important for you to complete the blood thinner medication as prescribed.  PRECAUTIONS:  If you experience chest pain or shortness of breath - call 911 immediately for transfer to the hospital emergency department.   If you develop a fever greater that 101 F, purulent drainage from wound, increased redness or drainage from wound, foul odor from the wound/dressing, or calf pain - CONTACT YOUR SURGEON.                                                   FOLLOW-UP APPOINTMENTS:  If you do not already have a post-op appointment, please call the office for an appointment to be seen by your surgeon.  Guidelines for how soon to be seen are listed in your After Visit Summary, but are typically between 1-4 weeks after surgery.  OTHER INSTRUCTIONS:   Knee Replacement:  Do not place pillow under knee, focus on keeping  the knee straight while resting. CPM instructions: 0-90 degrees, 2 hours in the morning, 2 hours in the afternoon, and 2 hours in the evening. Place foam block, curve side up under heel at all times except when in CPM or when walking.  DO NOT modify, tear, cut, or change the foam block in any way.  MAKE SURE YOU:   Understand these instructions.   Get help right away if you are not doing well or get worse.    Thank you for letting us be a part of your medical care team.  It is a privilege we respect greatly.  We hope these instructions will help you stay on track for a fast and full recovery!

## 2014-08-13 NOTE — Progress Notes (Signed)
PATIENT ID: Joshua BrookeHarda Wrinkle  MRN: [id number]  DOB/AGE:  April 04, 1930 / 81 y.o.  3 Days Post-Op Procedure(s) (LRB): IRRIGATION AND DEBRIDEMENT RIGHT HIP SUPERFICIAL VS DEEP (Right)    PROGRESS NOTE Subjective: Patient is alert, oriented, no Nausea, no Vomiting, yes passing gas, no Bowel Movement. Taking PO well. Denies SOB, Chest or Calf Pain. Using Incentive Spirometer, PAS in place. Ambulate WBAT on left leg and touch toe weight bearing on right.  Pt was up with therapy in room yesterday. Patient reports pain as mild  .    Objective: Vital signs in last 24 hours: Filed Vitals:   08/12/14 1538 08/12/14 2122 08/12/14 2123 08/13/14 0631  BP: 127/58 131/63  125/81  Pulse: 108 100 71 76  Temp: 98.7 F (37.1 C) 98 F (36.7 C)  97.7 F (36.5 C)  TempSrc: Oral Oral  Oral  Resp: 16 19  17   Height:      Weight:      SpO2: 100% 98% 98% 100%      Intake/Output from previous day: I/O last 3 completed shifts: In: 1427.5 [P.O.:720; Blood:707.5] Out: 1450 [Urine:1450]   Intake/Output this shift: Total I/O In: -  Out: 325 [Urine:325]   LABORATORY DATA:  Recent Labs  08/10/14 1213 08/10/14 1310  08/12/14 1601 08/12/14 1728 08/12/14 2133 08/13/14 0530 08/13/14 0634  WBC  --   --   < >  --  13.8*  --  8.7  --   HGB 9.5* 8.8*  < >  --  10.3*  --  8.8*  --   HCT 28.0* 26.0*  < >  --  31.7*  --  26.5*  --   PLT  --   --   < >  --  141*  --  132*  --   NA 143 142  --   --   --   --   --   --   K 4.2 4.3  --   --   --   --   --   --   GLUCOSE 88 112*  --   --   --   --   --   --   GLUCAP  --   --   < > 156*  --  142*  --  102*  < > = values in this interval not displayed.  Examination: Neurologically intact Neurovascular intact Sensation intact distally Intact pulses distally Dorsiflexion/Plantar flexion intact Incision: moderate drainage No cellulitis present Compartment soft} XR AP&Lat of hip shows well placed\fixed THA  Assessment:   3 Days Post-Op Procedure(s)  (LRB): IRRIGATION AND DEBRIDEMENT RIGHT HIP SUPERFICIAL VS DEEP (Right) ADDITIONAL DIAGNOSIS:  Expected Acute Blood Loss Anemia, Acute Blood Loss Anemia, Diabetes, Hypertension and Renal Insufficiency Chronic  Plan: PT/OT WBAT on left leg and touch toe on the right leg  DVT Prophylaxis: SCDx72 hrs, ASA 325 mg BID x 2 weeks  DISCHARGE PLAN: Skilled Nursing Facility/Rehab when bed avaliable  DISCHARGE NEEDS: HHPT, HHRN, Walker, 3-in-1 comode seat and IV Antibiotics   Continue with treatment plan out lined by infectious disease, with IV Cefazolin

## 2014-08-13 NOTE — Care Management Important Message (Signed)
Important Message  Patient Details  Name: [name] MRN: [id number] Date of Birth: 02-17-1930   Medicare Important Message Given:  Yes-second notification given    Orson AloeMegan P Huriel Matt 08/13/2014, 1:50 PM

## 2014-08-13 NOTE — Clinical Social Work Note (Signed)
[facility] and will be transported via EMS.  Patient is awaiting a recommendation by ID regarding IV abx vs PO prior to discharge.  Patient is aware.  Vickii PennaGina Edsel Shives, LCSW [phone]  Psychiatric & Orthopedics (5N 1-8) Clinical Social Worker

## 2014-08-13 NOTE — Progress Notes (Signed)
Physical Therapy Treatment Patient Details Name: [name] MRN: [id number] DOB: 17-Oct-1930 Today's Date: 08/13/2014    History of Present Illness Pt is a 81 y/o M s/p removal of parts and girldestone procedure due to R hip infection.  Pt had R prostehtic hip Jan 2014 which became infected Aug 2014 followed by revision and I and D.  His R hip dislocated x2 and it again became infected.  Pt's PMH includes a fib, HTN, pacemaker, anxiety and depression, DMII, lumbar laminectomy.    PT Comments    Sit>stand and stand pivot performed w/ mod +2 assist to maintain balance and adhere to TDWB status this session.  Pt is making modest progress toward therapy goals.  Pt will benefit from continued skilled PT services to increase functional independence and safety.   Follow Up Recommendations  SNF;Supervision/Assistance - 24 hour     Equipment Recommendations  Other (comment) (TBD by next venue of care)    Recommendations for Other Services       Precautions / Restrictions Precautions Precautions: Posterior Hip;Fall Precaution Comments: Pt's hip has dislocated x2 in the past.  Pt remembered 2/3 hip precautions (omitting hip F) at end of session using teachback. Restrictions Weight Bearing Restrictions: Yes RLE Weight Bearing: Weight bearing as tolerated    Mobility  Bed Mobility Overal bed mobility: Needs Assistance Bed Mobility: Supine to Sit     Supine to sit: Min assist;HOB elevated     General bed mobility comments: Min assist managing RLE to EOB.  Use of bed rails and increased time.  Cues on technique to adhere to hip precautions.  Transfers Overall transfer level: Needs assistance Equipment used: Rolling walker (2 wheeled) Transfers: Sit to/from [employer] Sit to Stand: Mod assist;+2 physical assistance;From elevated surface Stand pivot transfers: Mod assist;+2 physical assistance;+2 safety/equipment       General transfer comment: Mod +2 assist to  maintain balance and NWB on RLE during sit>stand.  Pt adhered to TDWB during stand pivot but required assist to maintain balance and ensure proper positioning w/ RW to sit in recliner chair.  Pt shuffles L foot, does not lift it from floor.  Ambulation/Gait                 Stairs            Wheelchair Mobility    Modified Rankin (Stroke Patients Only)       Balance Overall balance assessment: Needs assistance Sitting-balance support: No upper extremity supported;Feet supported Sitting balance-Leahy Scale: Good     Standing balance support: Bilateral upper extremity supported;During functional activity Standing balance-Leahy Scale: Poor                      Cognition Arousal/Alertness: Awake/alert Behavior During Therapy: WFL for tasks assessed/performed Overall Cognitive Status: Within Functional Limits for tasks assessed                      Exercises Total Joint Exercises Ankle Circles/Pumps: AROM;Both;15 reps;Seated General Exercises - Lower Extremity Long Arc Quad: AROM;Right;Seated;20 reps    General Comments        Pertinent Vitals/Pain Pain Assessment: 0-10 Pain Score: 3  Pain Location: R hip Pain Descriptors / Indicators: Aching;Jabbing Pain Intervention(s): Limited activity within patient's tolerance;Monitored during session;Repositioned    Home Living                      Prior Function  PT Goals (current goals can now be found in the care plan section) Acute Rehab PT Goals Patient Stated Goal: none stated PT Goal Formulation: With patient Time For Goal Achievement: 08/18/14 Potential to Achieve Goals: Good    Frequency  Min 3X/week    PT Plan Frequency needs to be updated    Co-evaluation             End of Session Equipment Utilized During Treatment: Gait belt Activity Tolerance: Patient limited by fatigue Patient left: in chair;with call bell/phone within reach     Time:  1039-1057 PT Time Calculation (min) (ACUTE ONLY): 18 min  Charges:  $Therapeutic Activity: 8-22 mins                    G Codes:      Michail JewelsAshley Parr PT, DPT [phone] Pager: [phone] 08/13/2014, 1:49 PM

## 2014-08-13 NOTE — Anesthesia Postprocedure Evaluation (Signed)
Anesthesia Post Note  Patient: [name]  Procedure(s) Performed: Procedure(s) (LRB): IRRIGATION AND DEBRIDEMENT RIGHT HIP SUPERFICIAL VS DEEP (Right)  Anesthesia type: general  Patient location: PACU  Post pain: Pain level controlled  Post assessment: Patient's Cardiovascular Status Stable  Last Vitals:  Filed Vitals:   08/13/14 0631  BP: 125/81  Pulse: 76  Temp: 36.5 C  Resp: 17    Post vital signs: Reviewed and stable  Level of consciousness: sedated  Complications: No apparent anesthesia complications

## 2014-08-14 ENCOUNTER — Inpatient Hospital Stay (HOSPITAL_COMMUNITY): Payer: Medicare Other

## 2014-08-14 LAB — TISSUE CULTURE: Culture: NO GROWTH

## 2014-08-14 LAB — GLUCOSE, CAPILLARY
Glucose-Capillary: 91 mg/dL (ref 65–99)
Glucose-Capillary: 93 mg/dL (ref 65–99)

## 2014-08-14 MED ORDER — CEFAZOLIN SODIUM 1 G IV SOLR: 1.0000 g | Freq: Two times a day (BID) | INTRAVENOUS | Status: DC

## 2014-08-14 MED ORDER — LIDOCAINE HCL 1 % IJ SOLN
INTRAMUSCULAR | Status: AC
  Filled 2014-08-14: qty 20

## 2014-08-14 NOTE — Progress Notes (Signed)
CSW/unit staff unable to locate NH packet that was prepared earlier today by unit CSW. New packet prepared.  Dr. Janee Mornhompson available to sign new FL2.  [facility]

## 2014-08-14 NOTE — Progress Notes (Signed)
PATIENT ID: Charlene BrookeHarda Slaby  MRN: [id number]  DOB/AGE:  08-05-1930 / 81 y.o.  4 Days Post-Op Procedure(s) (LRB): IRRIGATION AND DEBRIDEMENT RIGHT HIP SUPERFICIAL VS DEEP (Right)    PROGRESS NOTE Subjective: Patient is alert, oriented, no Nausea, no Vomiting, yes passing gas, no Bowel Movement. Taking PO well with pt up and eating in bed. Denies SOB, Chest or Calf Pain. Using Incentive Spirometer, PAS in place. Ambulate WBAT on left and touch toe on the right Patient reports pain as mild  .    Objective: Vital signs in last 24 hours: Filed Vitals:   08/13/14 0932 08/13/14 1300 08/13/14 2013 08/14/14 0543  BP: 125/74 132/64 146/82 139/93  Pulse:  72 69 87  Temp:  97.8 F (36.6 C) 98.5 F (36.9 C) 98.2 F (36.8 C)  TempSrc:  Oral    Resp:   18 18  Height:      Weight:      SpO2:  100% 99% 100%      Intake/Output from previous day: I/O last 3 completed shifts: In: 810 [P.O.:560; IV Piggyback:250] Out: 625 [Urine:625]   Intake/Output this shift:     LABORATORY DATA:  Recent Labs  08/12/14 1728  08/13/14 0530  08/13/14 1648 08/13/14 2111 08/14/14 0710  WBC 13.8*  --  8.7  --   --   --   --   HGB 10.3*  --  8.8*  --   --   --   --   HCT 31.7*  --  26.5*  --   --   --   --   PLT 141*  --  132*  --   --   --   --   GLUCAP  --   < >  --   < > 117* 127* 91  < > = values in this interval not displayed.  Examination: Neurologically intact Neurovascular intact Sensation intact distally Intact pulses distally Dorsiflexion/Plantar flexion intact Incision: scant drainage No cellulitis present Compartment soft} XR AP&Lat of hip shows well placed\fixed THA  Assessment:   4 Days Post-Op Procedure(s) (LRB): IRRIGATION AND DEBRIDEMENT RIGHT HIP SUPERFICIAL VS DEEP (Right) ADDITIONAL DIAGNOSIS:  Expected Acute Blood Loss Anemia, Acute Blood Loss Anemia, Diabetes, Hypertension and Renal Insufficiency Chronic  Plan: PT/OT WBAT on left leg and touch toe on the right  leg  DVT Prophylaxis: SCDx72 hrs, ASA 325 mg BID x 2 weeks  DISCHARGE PLAN: Skilled Nursing Facility/[facility]  DISCHARGE NEEDS: HHPT, HHRN, Walker, 3-in-1 comode seat and IV Antibiotics   Pt to remain on IV cefazolin for 4 weeks.

## 2014-08-14 NOTE — Progress Notes (Signed)
Physical Therapy Treatment Patient Details Name: [name] Ferger MRN: [id number] DOB: 05-04-1930 Today's Date: 08/14/2014    History of Present Illness Pt is a 81 y/o M s/p removal of parts and girldestone procedure due to R hip infection.  Pt had R prostehtic hip Jan 2014 which became infected Aug 2014 followed by revision and I and D.  His R hip dislocated x2 and it again became infected.  Pt's PMH includes a fib, HTN, pacemaker, anxiety and depression, DMII, lumbar laminectomy.    PT Comments    Able to ambulate 8 ft adhering to TDWB on RLE this session.  Pt will benefit from continued skilled PT services to increase functional independence and safety.  Follow Up Recommendations  SNF;Supervision/Assistance - 24 hour     Equipment Recommendations  Other (comment) (TBD by next venue of care)    Recommendations for Other Services       Precautions / Restrictions Precautions Precautions: Posterior Hip;Fall Precaution Comments: Pt's hip has dislocated x2 in the past.  Pt remembered 3/3 hip precautions at end of session using teachback. Restrictions Weight Bearing Restrictions: Yes RLE Weight Bearing: Touchdown weight bearing    Mobility  Bed Mobility Overal bed mobility: Needs Assistance Bed Mobility: Supine to Sit     Supine to sit: Min guard;HOB elevated     General bed mobility comments: Increased time and use of bed rail. Cues on techniques to maintain hip precautions  Transfers Overall transfer level: Needs assistance Equipment used: Rolling walker (2 wheeled) Transfers: Sit to/from Stand Sit to Stand: Mod assist;+2 physical assistance;From elevated surface         General transfer comment: Mod +2 assist during sit>stand to maintain balance and adhere to TDWB.    Ambulation/Gait Ambulation/Gait assistance: Min assist;+2 safety/equipment Ambulation Distance (Feet): 8 Feet Assistive device: Rolling walker (2 wheeled) Gait Pattern/deviations: Step-to  pattern;Antalgic;Decreased weight shift to right;Decreased stance time - right   Gait velocity interpretation: Below normal speed for age/gender General Gait Details: Adhered to TDWB on RLE pushing through BUEs. Became fatigued after ambulating 8 ft and requested to sit.   Stairs            Wheelchair Mobility    Modified Rankin (Stroke Patients Only)       Balance Overall balance assessment: Needs assistance Sitting-balance support: No upper extremity supported;Feet supported Sitting balance-Leahy Scale: Good     Standing balance support: Bilateral upper extremity supported;During functional activity Standing balance-Leahy Scale: Poor Standing balance comment: relies heavily on RW for support and to adhere to TDWB                    Cognition Arousal/Alertness: Awake/alert Behavior During Therapy: [facility] for tasks assessed/performed Overall Cognitive Status: Within Functional Limits for tasks assessed                      Exercises Total Joint Exercises Ankle Circles/Pumps: AROM;Both;15 reps;Seated Quad Sets: AROM;Both;10 reps;Supine General Exercises - Lower Extremity Long Arc Quad: AROM;Right;Seated;20 reps    General Comments        Pertinent Vitals/Pain Pain Assessment: 0-10 Pain Score: 4  Pain Location: R hip Pain Descriptors / Indicators: Aching;Sore Pain Intervention(s): Limited activity within patient's tolerance;Monitored during session;Repositioned    Home Living                      Prior Function            PT Goals (current goals  can now be found in the care plan section) Acute Rehab PT Goals Patient Stated Goal: none stated PT Goal Formulation: With patient Time For Goal Achievement: 08/18/14 Potential to Achieve Goals: Good    Frequency  Min 3X/week    PT Plan Current plan remains appropriate    Co-evaluation             End of Session Equipment Utilized During Treatment: Gait belt Activity  Tolerance: Patient limited by fatigue Patient left: in chair;with call bell/phone within reach     Time: 0940-0958 PT Time Calculation (min) (ACUTE ONLY): 18 min  Charges:  $Therapeutic Exercise: 8-22 mins                    G Codes:      Michail Jewels PT, DPT [phone] Pager: [phone] 08/14/2014, 1:10 PM

## 2014-08-14 NOTE — Discharge Planning (Signed)
Patient will discharge today per MD order. [facility] RN to call report prior to transportation to: [phone] Transportation: [facility]- RN to call for transportation [phone]) after the PICC is placed approx 3:30pm today  CSW sent discharge summary to SNF for review.  Packet is complete.  RN, patient and family aware of discharge plans.  Vickii PennaGina Venissa Nappi, LCSWA [phone]  Psychiatric & Orthopedics (5N 1-16) Clinical Social Worker

## 2014-08-14 NOTE — Discharge Summary (Signed)
Patient ID: [name] MRN: [id number] DOB/AGE: [birth year] 81 y.o.  Admit date: 08/10/2014 Discharge date: 08/14/2014  Admission Diagnoses:  Principal Problem:   Infected prosthesis of right hip Active Problems:   Staphylococcus aureus bacteremia   Postoperative wound infection of right hip   Discharge Diagnoses:  Same  Past Medical History  Diagnosis Date  . Atrial fibrillation   . Hypertension   . Pacemaker   . Arthritis   . Anxiety   . Depression   . MSSA (methicillin susceptible Staphylococcus aureus) infection   . Bacteremia   . Infected pacemaker   . Septic hip   . Complication of anesthesia     " Terrible Nightmares"  . Dysrhythmia     Afib- had pacemaker, had it removed- takes Aspirin 325- daily  . Diabetes mellitus without complication     Type II  . Constipation   . History of blood product transfusion   . OAB (overactive bladder)   . CKD (chronic kidney disease)   . Hypothyroidism     Surgeries: Procedure(s): IRRIGATION AND DEBRIDEMENT RIGHT HIP SUPERFICIAL VS DEEP on 08/10/2014   Consultants:    Discharged Condition: Improved  Hospital Course: Keiyon Plack is an 81 y.o. male who was admitted 08/10/2014 for operative treatment ofInfected prosthesis of right hip. Patient has severe unremitting pain that affects sleep, daily activities, and work/hobbies. After pre-op clearance the patient was taken to the operating room on 08/10/2014 and underwent  Procedure(s): IRRIGATION AND DEBRIDEMENT RIGHT HIP SUPERFICIAL VS DEEP.    Patient was given perioperative antibiotics: Anti-infectives    Start     Dose/Rate Route Frequency Ordered Stop   08/14/14 0000  ceFAZolin (ANCEF) 1 G injection     1 g Intravenous Every 12 hours 08/14/14 0813     08/10/14 2200  ceFAZolin (ANCEF) IVPB 1 g/50 mL premix     1 g 100 mL/hr over 30 Minutes Intravenous Every 12 hours 08/10/14 1652     08/10/14 1214  cefUROXime (ZINACEF) injection  Status:  Discontinued       As  needed 08/10/14 1214 08/10/14 1433   08/10/14 1210  tobramycin (NEBCIN) powder  Status:  Discontinued       As needed 08/10/14 1212 08/10/14 1433   08/10/14 0930  ceFAZolin (ANCEF) IVPB 2 g/50 mL premix     2 g 100 mL/hr over 30 Minutes Intravenous To [facility] 08/09/14 1250 08/10/14 1051       Patient was given sequential compression devices, early ambulation, and chemoprophylaxis to prevent DVT.  Patient benefited maximally from hospital stay and there were no complications.    Recent vital signs: Patient Vitals for the past 24 hrs:  BP Temp Temp src Pulse Resp SpO2  08/14/14 0543 (!) 139/93 mmHg 98.2 F (36.8 C) - 87 18 100 %  08/13/14 2013 (!) 146/82 mmHg 98.5 F (36.9 C) - 69 18 99 %  08/13/14 1300 132/64 mmHg 97.8 F (36.6 C) Oral 72 - 100 %  08/13/14 0932 125/74 mmHg - - - - -     Recent laboratory studies:  Recent Labs  08/12/14 1728 08/13/14 0530  WBC 13.8* 8.7  HGB 10.3* 8.8*  HCT 31.7* 26.5*  PLT 141* 132*     Discharge Medications:     Medication List    STOP taking these medications        aspirin 325 MG tablet  Replaced by:  aspirin EC 325 MG tablet     diclofenac 75 MG EC  tablet  Commonly known as:  VOLTAREN     meloxicam 15 MG tablet  Commonly known as:  MOBIC      TAKE these medications        ALPRAZolam 0.5 MG tablet  Commonly known as:  XANAX  Take 0.5 mg by mouth as needed for anxiety.     aspirin EC 325 MG tablet  Take 1 tablet (325 mg total) by mouth 2 (two) times daily.     ceFAZolin 1 G injection  Commonly known as:  ANCEF  Inject 1,000 mg (1 g total) into the vein every 12 (twelve) hours.     cephALEXin 500 MG capsule  Commonly known as:  KEFLEX  Take 1 capsule (500 mg total) by mouth as directed.     diltiazem 120 MG 24 hr capsule  Commonly known as:  CARDIZEM CD  Take 120 mg by mouth daily.     DSS 100 MG Caps  Take 100 mg by mouth 2 (two) times daily.     ferrous sulfate 325 (65 FE) MG tablet  Take 325  mg by mouth daily with breakfast.     HYDROcodone-acetaminophen 5-325 MG per tablet  Commonly known as:  NORCO/VICODIN  Take 1-2 tablets by mouth every 4 (four) hours as needed for moderate pain.     levothyroxine 88 MCG tablet  Commonly known as:  SYNTHROID, LEVOTHROID  Take 88 mcg by mouth daily before breakfast.     lovastatin 40 MG tablet  Commonly known as:  MEVACOR  Take 40 mg by mouth daily.     metoprolol succinate 25 MG 24 hr tablet  Commonly known as:  TOPROL-XL  Take 12.5 mg by mouth daily.     MYRBETRIQ 50 MG Tb24 tablet  Generic drug:  mirabegron ER  Take 50 mg by mouth daily.     oxyCODONE-acetaminophen 5-325 MG per tablet  Commonly known as:  ROXICET  Take 1 tablet by mouth every 4 (four) hours as needed.     pioglitazone 15 MG tablet  Commonly known as:  ACTOS  Take 15 mg by mouth daily.     potassium chloride SA 20 MEQ tablet  Commonly known as:  K-DUR,KLOR-CON  Take 1 tablet (20 mEq total) by mouth daily.     terazosin 5 MG capsule  Commonly known as:  HYTRIN  Take 5 mg by mouth at bedtime.     tiZANidine 2 MG tablet  Commonly known as:  ZANAFLEX  Take 1 tablet (2 mg total) by mouth every 6 (six) hours as needed for muscle spasms.     torsemide 20 MG tablet  Commonly known as:  DEMADEX  Take 20 mg by mouth daily.        Diagnostic Studies: Dg Pelvis Portable  08/10/2014   CLINICAL DATA:  Status post irrigation and debridement of infected right total hip arthroplasty with removal of components.  EXAM: PORTABLE PELVIS 1-2 VIEWS  COMPARISON:  11/28/2012  FINDINGS: Components of the right hip arthroplasty have been removed. A methylmethacrylate spacer is present in the acetabulum. Surgical drains are present. No overt bony destruction is seen.  IMPRESSION: Status post removal of right hip arthroplasty. Methylmethacrylate spacer visualized in the acetabulum.   Electronically Signed   By: Irish LackGlenn  Yamagata M.D.   On: 08/10/2014 15:09    Disposition: ED  Dismiss - Never Arrived      Discharge Instructions    Call MD / Call 911    Complete by:  As  directed   If you experience chest pain or shortness of breath, CALL 911 and be transported to the hospital emergency room.  If you develope a fever above 101 F, pus (white drainage) or increased drainage or redness at the wound, or calf pain, call your surgeon's office.     Change dressing    Complete by:  As directed   Change dressing on 5, then change the dressing daily with sterile 4 x 4 inch gauze dressing and apply TED hose.  You may clean the incision with alcohol prior to redressing.     Change dressing    Complete by:  As directed   You may change your dressing on day 5, then change the dressing daily with sterile 4 x 4 inch gauze dressing and paper tape.  You may clean the incision with alcohol prior to redressing     Constipation Prevention    Complete by:  As directed   Drink plenty of fluids.  Prune juice may be helpful.  You may use a stool softener, such as Colace (over the counter) 100 mg twice a day.  Use MiraLax (over the counter) for constipation as needed.     Diet - low sodium heart healthy    Complete by:  As directed      Discharge instructions    Complete by:  As directed   Follow up in office with Dr. Turner Daniels in 2 weeks.     Increase activity slowly as tolerated    Complete by:  As directed      Patient may shower    Complete by:  As directed   You may shower without a dressing once there is no drainage.  Do not wash over the wound.  If drainage remains, cover wound with plastic wrap and then shower.           Follow-up Information    Follow up with Nestor Lewandowsky, MD In 2 weeks.   Specialty:  Orthopedic Surgery   Contact information:   [address] [place] [address] [phone]        Signed: Henry Russel 08/14/2014, 8:13 AM

## 2014-08-14 NOTE — Procedures (Signed)
Successful placement of single lumen PICC line to right basilic vein. Length 40 cm Tip at lower SVC/RA No complications Ready for use.  Chyann Ambrocio S Jouri Threat PA-C @

## 2014-08-15 NOTE — Progress Notes (Signed)
[facility] contacted at [phone] to transport [facility].  I called [facility] several times at [phone] to give report to nurse - received no answer.  [facility], I called [facility] again in their (transporter's) presence; no answer.  I gave the transporter my name and direct number to give the nurse at [facility] in order to get an updated report.   Patient left the unit at 2330 on 08/14/14 via stretcher.  He was alert and oriented.  Very pleasant.  He received Oxy IR 5 mg PO for pain and for the travel.   Currently 0100 on 08/15/14 and [facility].

## 2014-08-16 LAB — ANAEROBIC CULTURE

## 2014-09-18 ENCOUNTER — Inpatient Hospital Stay: Payer: PRIVATE HEALTH INSURANCE | Admitting: Infectious Disease

## 2014-09-19 ENCOUNTER — Inpatient Hospital Stay: Payer: PRIVATE HEALTH INSURANCE | Admitting: Infectious Disease

## 2014-09-27 ENCOUNTER — Encounter: Payer: Self-pay | Admitting: Infectious Disease

## 2014-09-27 ENCOUNTER — Ambulatory Visit (INDEPENDENT_AMBULATORY_CARE_PROVIDER_SITE_OTHER): Payer: Medicare Other | Admitting: Infectious Disease

## 2014-09-27 VITALS — BP 148/76 | HR 80 | Temp 97.7°F | Wt 230.0 lb

## 2014-09-27 DIAGNOSIS — T8451XA Infection and inflammatory reaction due to internal right hip prosthesis, initial encounter: Secondary | ICD-10-CM

## 2014-09-27 DIAGNOSIS — T827XXD Infection and inflammatory reaction due to other cardiac and vascular devices, implants and grafts, subsequent encounter: Secondary | ICD-10-CM

## 2014-09-27 DIAGNOSIS — T8459XD Infection and inflammatory reaction due to other internal joint prosthesis, subsequent encounter: Secondary | ICD-10-CM | POA: Diagnosis not present

## 2014-09-27 DIAGNOSIS — Z96649 Presence of unspecified artificial hip joint: Principal | ICD-10-CM

## 2014-09-27 DIAGNOSIS — T814XXD Infection following a procedure, subsequent encounter: Secondary | ICD-10-CM

## 2014-09-27 DIAGNOSIS — IMO0001 Reserved for inherently not codable concepts without codable children: Secondary | ICD-10-CM

## 2014-09-27 DIAGNOSIS — A4901 Methicillin susceptible Staphylococcus aureus infection, unspecified site: Secondary | ICD-10-CM

## 2014-09-27 DIAGNOSIS — B999 Unspecified infectious disease: Secondary | ICD-10-CM

## 2014-09-27 LAB — BASIC METABOLIC PANEL WITH GFR
BUN: 19 mg/dL (ref 7–25)
CALCIUM: 9.1 mg/dL (ref 8.6–10.3)
CO2: 26 mmol/L (ref 20–31)
Chloride: 104 mmol/L (ref 98–110)
Creat: 1.33 mg/dL — ABNORMAL HIGH (ref 0.70–1.11)
GFR, EST AFRICAN AMERICAN: 57 mL/min — AB (ref >=60)
GFR, EST NON AFRICAN AMERICAN: 49 mL/min — AB (ref >=60)
Glucose, Bld: 105 mg/dL — ABNORMAL HIGH (ref 65–99)
Potassium: 3.8 mmol/L (ref 3.5–5.3)
SODIUM: 145 mmol/L (ref 135–146)

## 2014-09-27 LAB — CBC WITH DIFFERENTIAL/PLATELET
BASOS ABS: 0.1 10*3/uL (ref 0.0–0.1)
BASOS PCT: 1 % (ref 0–1)
EOS ABS: 0.1 10*3/uL (ref 0.0–0.7)
Eosinophils Relative: 2 % (ref 0–5)
HCT: 35 % — ABNORMAL LOW (ref 39.0–52.0)
Hemoglobin: 11.7 g/dL — ABNORMAL LOW (ref 13.0–17.0)
Lymphocytes Relative: 15 % (ref 12–46)
Lymphs Abs: 0.9 10*3/uL (ref 0.7–4.0)
MCH: 31.2 pg (ref 26.0–34.0)
MCHC: 33.4 g/dL (ref 30.0–36.0)
MCV: 93.3 fL (ref 78.0–100.0)
MPV: 11.4 fL (ref 8.6–12.4)
Monocytes Absolute: 0.7 10*3/uL (ref 0.1–1.0)
Monocytes Relative: 11 % (ref 3–12)
NEUTROS PCT: 71 % (ref 43–77)
Neutro Abs: 4.5 10*3/uL (ref 1.7–7.7)
PLATELETS: 206 10*3/uL (ref 150–400)
RBC: 3.75 MIL/uL — AB (ref 4.22–5.81)
RDW: 15 % (ref 11.5–15.5)
WBC: 6.3 10*3/uL (ref 4.0–10.5)

## 2014-09-27 LAB — C-REACTIVE PROTEIN: CRP: 1.1 mg/dL — ABNORMAL HIGH (ref <0.60)

## 2014-09-27 MED ORDER — CEPHALEXIN 500 MG PO CAPS: 1000.0000 mg | ORAL_CAPSULE | Freq: Two times a day (BID) | ORAL | Status: DC

## 2014-09-27 NOTE — Progress Notes (Signed)
Subjective:    Patient ID: [name], male    DOB: 1930-10-19, 81 y.o.   MRN: [id number]  HPI   81 y.o. male with Pacemaker, Right prosthetic hip, Laminectomy in January of 2014 admitted in August 2014 with right leg pain weakness fevers. He has been found to have MSSA bacteremia and a large retroperitoneal abscess that is adjacent to his prosthetic hip.  Orthopedics performed resection of Radical irrigation and debridement right total hip with removal of all synovium, revision of femoral head, revision of polyethylene liner placement On 09/02/12  TEE failed to show vegetations on valves or overtly on PM. PM is out. He returned to the hospital with dislocation of his hip. This was able to be reduced without surgical intervention. My partner Dr. Johnnye Sima saw the patient and noted his sedimentation rate and C-reactive protein is still be quite high. Patient completed a course of  8 weeks of cefazolin 2 g IV every 8 hours at the skilled nursing facility along with  oral rifampin twice daily.   I changed him to oral Keflex when his IV antibiotics were stopped and he continued on high-dose Keflex with rifampin since then.   He had recurrent hip dislocations since then.  He was readmitted in late October 2014 and underwent  Irrigation and debridement of infected right total hip with removal of all synovium, revision of femoral head to a +5 40 mm metal head, revision of polyethylene liner to a constrained liner  He had remained on keflex 587m po tid since then but stopped the rifampin  His hip pain  dissapeared but he has now pain in the right leg itself and difficulty raising leg vs gravity though he can bear weight on it in fall of 2015.  Since then he has experienced recurrence of MSSA infeciton of his hip requiring  Radical irrigation and debridement of infected hybrid right total hip with removal of all synovium, removal of all components including cement going down the femur and the  distal cement restrictor Hemovac drains. Placement of acetabular polymethylmethacrylate spacer   By Dr. RMayer Camelon July 16th, 2016.  intraop cultures yielded no organism but he was on keflex (and rifampin prior to surgery). He has been placed back on Ancef IV  Review of Systems  Constitutional: Negative for chills, diaphoresis, activity change, appetite change, fatigue and unexpected weight change.  HENT: Negative for sore throat.   Eyes: Negative for visual disturbance.  Respiratory: Negative for cough and chest tightness.   Gastrointestinal: Negative for nausea, vomiting, abdominal pain, diarrhea, constipation, blood in stool, abdominal distention and anal bleeding.  Genitourinary: Negative for dysuria, hematuria, flank pain and difficulty urinating.  Musculoskeletal: Positive for gait problem. Negative for back pain and joint swelling.  Skin: Negative for color change, pallor, rash and wound.  Neurological: Positive for weakness. Negative for dizziness, tremors and light-headedness.  Hematological: Negative for adenopathy. Does not bruise/bleed easily.  Psychiatric/Behavioral: Negative for behavioral problems, confusion, sleep disturbance, dysphoric mood, decreased concentration and agitation.       Objective:   Physical Exam  Constitutional: He is oriented to person, place, and time. He appears well-developed and well-nourished. No distress.  HENT:  Head: Normocephalic and atraumatic.  Mouth/Throat: Oropharynx is clear and moist. No oropharyngeal exudate.  Eyes: Conjunctivae and EOM are normal. No scleral icterus.  Neck: Normal range of motion. Neck supple.    Cardiovascular: Normal rate, regular rhythm and normal heart sounds.   Pulmonary/Chest: Effort normal and breath sounds normal. No  respiratory distress. He has no wheezes. He has no rales. He exhibits no tenderness.  Abdominal: He exhibits no distension. There is tenderness. There is no rebound and no guarding.    Musculoskeletal: He exhibits no tenderness.       Legs: Neurological: He is alert and oriented to person, place, and time. He exhibits normal muscle tone.  Skin: Skin is warm and dry. He is not diaphoretic. No erythema. No pallor.     Psychiatric: He has a normal mood and affect. His behavior is normal. Judgment and thought content normal.          Assessment & Plan:  #1  MSSA bacteremia and a large retroperitoneal abscess prosthetic hip infection, sp resection removal of pacemaker, and sp Radical irrigation and debridement right total hip with removal of all synovium, revision of femoral head, revision of polyethylene liner placement 2 years ago and now recurrence with infection sp Radical irrigation and debridement of infected hybrid right total hip with removal of all synovium, removal of all components including cement going down the femur and the distal cement restrictor Hemovac drains. Placement of acetabular polymethylmethacrylate spacer  And having now completed nearly 6 weeks of IV cefazolin 1 gram q 12 hours.   --recheck ESR, CRP and if encouraging let him finish ancef and then switch to high-dose Keflex 1 g twice daily for many months  I spent greater than 25 minutes with the patient including greater than 50% of time in face to face counsel of the patient regarding his MSSA bacteremia pacemaker infection retroperitoneal abscess and recurrent prosthetic hip infection and in coordination of their care.

## 2014-09-27 NOTE — Progress Notes (Signed)
Patient ID: [name], male   DOB: 1931/01/17, 81 y.o.   MRN: [id number] HPI: Eric Nees is a 81 y.o. male who is here for his f/u of the hip infection.   Allergies: Allergies  Allergen Reactions  . Ambien [Zolpidem]     hallucinations    Vitals: Temp: 97.7 F (36.5 C) (09/01 0910) Temp Source: Oral (09/01 0910) BP: 148/76 mmHg (09/01 0910) Pulse Rate: 80 (09/01 0910)  Past Medical History: Past Medical History  Diagnosis Date  . Atrial fibrillation   . Hypertension   . Pacemaker   . Arthritis   . Anxiety   . Depression   . MSSA (methicillin susceptible Staphylococcus aureus) infection   . Bacteremia   . Infected pacemaker   . Septic hip   . Complication of anesthesia     " Terrible Nightmares"  . Dysrhythmia     Afib- had pacemaker, had it removed- takes Aspirin 325- daily  . Diabetes mellitus without complication     Type II  . Constipation   . History of blood product transfusion   . OAB (overactive bladder)   . CKD (chronic kidney disease)   . Hypothyroidism     Social History: Social History   Social History  . Marital Status: Married    Spouse Name: N/A  . Number of Children: N/A  . Years of Education: N/A   Social History Main Topics  . Smoking status: Never Smoker   . Smokeless tobacco: Never Used  . Alcohol Use: No  . Drug Use: No  . Sexual Activity: Not Asked   Other Topics Concern  . None   Social History Narrative    Previous Regimen:   Current Regimen: Ancef  Labs: HIV 1 RNA QUANT (copies/mL)  Date Value  08/29/2012 <20   HCV AB (no units)  Date Value  08/30/2012 NEGATIVE    CrCl: CrCl cannot be calculated (Patient has no serum creatinine result on file.).  Lipids: No results found for: CHOL, TRIG, HDL, CHOLHDL, VLDL, LDLCALC  Assessment: He has had a long hx of hip infection. The hardware has been removed. He is currently residing at [facility] for IV abx. It's scheduled to be stopped in AM 09/28/14. Due to his long  hx of chronic/recurrent hip infection, plan is to extend the course of his abx with keflex. He did have some renal impairment in July. He weighs ~240lbs, therefore, we are going to use a higher dose of keflex instead due to the severity of his infection. Crcl is prob around 40ish due to age.   Recommendations:  Start Keflex 1g PO BID after Ancef is done  Clide Cliff, PharmD Clinical Infectious Disease Pharmacist [facility] 09/27/2014, 9:27 AM

## 2014-09-27 NOTE — Patient Instructions (Signed)
We are going to check some blood work today  If that is reassuring then we can stop your IV antibiotics as planned  ONce you have stopped the IV antibiotics you should start back up on cephalexin at dose of TWO  tablets twice daily  Followup with Dr. Daiva Eves in 2 months

## 2014-09-28 ENCOUNTER — Other Ambulatory Visit: Payer: Self-pay | Admitting: *Deleted

## 2014-09-28 LAB — SEDIMENTATION RATE: Sed Rate: 28 mm/hr — ABNORMAL HIGH (ref 0–20)

## 2014-09-28 MED ORDER — CEPHALEXIN 500 MG PO CAPS: ORAL_CAPSULE | ORAL | Status: DC

## 2014-09-28 NOTE — Telephone Encounter (Signed)
Original order required clarification for [facility].

## 2014-11-08 DIAGNOSIS — R972 Elevated prostate specific antigen [PSA]: Secondary | ICD-10-CM | POA: Insufficient documentation

## 2014-11-28 ENCOUNTER — Ambulatory Visit (INDEPENDENT_AMBULATORY_CARE_PROVIDER_SITE_OTHER): Payer: Medicare Other | Admitting: Infectious Disease

## 2014-11-28 ENCOUNTER — Encounter: Payer: Self-pay | Admitting: Infectious Disease

## 2014-11-28 VITALS — BP 133/66 | HR 89 | Temp 97.5°F | Ht 71.5 in | Wt 235.0 lb

## 2014-11-28 DIAGNOSIS — M4805 Spinal stenosis, thoracolumbar region: Secondary | ICD-10-CM

## 2014-11-28 DIAGNOSIS — IMO0001 Reserved for inherently not codable concepts without codable children: Secondary | ICD-10-CM

## 2014-11-28 DIAGNOSIS — B999 Unspecified infectious disease: Secondary | ICD-10-CM

## 2014-11-28 DIAGNOSIS — A4901 Methicillin susceptible Staphylococcus aureus infection, unspecified site: Secondary | ICD-10-CM

## 2014-11-28 DIAGNOSIS — T827XXD Infection and inflammatory reaction due to other cardiac and vascular devices, implants and grafts, subsequent encounter: Secondary | ICD-10-CM

## 2014-11-28 DIAGNOSIS — B9561 Methicillin susceptible Staphylococcus aureus infection as the cause of diseases classified elsewhere: Secondary | ICD-10-CM

## 2014-11-28 DIAGNOSIS — M546 Pain in thoracic spine: Secondary | ICD-10-CM | POA: Diagnosis not present

## 2014-11-28 DIAGNOSIS — T8459XD Infection and inflammatory reaction due to other internal joint prosthesis, subsequent encounter: Secondary | ICD-10-CM

## 2014-11-28 DIAGNOSIS — Z96649 Presence of unspecified artificial hip joint: Secondary | ICD-10-CM

## 2014-11-28 DIAGNOSIS — T814XXS Infection following a procedure, sequela: Secondary | ICD-10-CM

## 2014-11-28 DIAGNOSIS — R7881 Bacteremia: Secondary | ICD-10-CM

## 2014-11-28 HISTORY — DX: Pain in thoracic spine: M54.6

## 2014-11-28 LAB — C-REACTIVE PROTEIN: CRP: 0.9 mg/dL — AB (ref <0.60)

## 2014-11-28 LAB — BASIC METABOLIC PANEL WITH GFR
BUN: 24 mg/dL (ref 7–25)
CHLORIDE: 107 mmol/L (ref 98–110)
CO2: 27 mmol/L (ref 20–31)
Calcium: 9.1 mg/dL (ref 8.6–10.3)
Creat: 1.43 mg/dL — ABNORMAL HIGH (ref 0.70–1.11)
GFR, EST AFRICAN AMERICAN: 52 mL/min — AB (ref >=60)
GFR, Est Non African American: 45 mL/min — ABNORMAL LOW (ref >=60)
Glucose, Bld: 102 mg/dL — ABNORMAL HIGH (ref 65–99)
Potassium: 5.1 mmol/L (ref 3.5–5.3)
Sodium: 142 mmol/L (ref 135–146)

## 2014-11-28 MED ORDER — DIAZEPAM 5 MG PO TABS: 5.0000 mg | ORAL_TABLET | Freq: Three times a day (TID) | ORAL | Status: DC | PRN

## 2014-11-28 MED ORDER — CEPHALEXIN 500 MG PO CAPS: 1000.0000 mg | ORAL_CAPSULE | Freq: Two times a day (BID) | ORAL | Status: DC

## 2014-11-28 NOTE — Progress Notes (Signed)
Subjective:    Patient ID: [name], male    DOB: [birth year], 81 y.o.   MRN: [id number]  HPI   81 y.o. male with Pacemaker, Right prosthetic hip, Laminectomy in January of 2014 admitted in August 2014 with right leg pain weakness fevers. He has been found to have MSSA bacteremia and a large retroperitoneal abscess that is adjacent to his prosthetic hip.  Orthopedics performed resection of Radical irrigation and debridement right total hip with removal of all synovium, revision of femoral head, revision of polyethylene liner placement On 09/02/12  TEE failed to show vegetations on valves or overtly on PM. PM is out. He returned to the hospital with dislocation of his hip. This was able to be reduced without surgical intervention. My partner Dr. Johnnye Sima saw the patient and noted his sedimentation rate and C-reactive protein is still be quite high. Patient completed a course of  8 weeks of cefazolin 2 g IV every 8 hours at the skilled nursing facility along with  oral rifampin twice daily.   I changed him to oral Keflex when his IV antibiotics were stopped and he continued on high-dose Keflex with rifampin since then.   He had recurrent hip dislocations since then.  He was readmitted in late October 2014 and underwent  Irrigation and debridement of infected right total hip with removal of all synovium, revision of femoral head to a +5 40 mm metal head, revision of polyethylene liner to a constrained liner  He had remained on keflex 563m po tid in the interim but stopped the rifampin.  His hip pain  dissapeared but he then developed pain in the right leg itself and difficulty raising leg vs gravity though he can bear weight on it in fall of 2015.  Since then he has experienced recurrence of MSSA infeciton of his hip requiring  Radical irrigation and debridement of infected hybrid right total hip with removal of all synovium, removal of all components including cement going down the femur  and the distal cement restrictor Hemovac drains. Placement of acetabular polymethylmethacrylate spacer   By Dr. RMayer Camelon July 16th, 2016.  intraop cultures yielded no organism but he was on keflex (and rifampin prior to surgery). He had  been placed back on Ancef IV and then transitioned back to oral keflex currently at 1 g po bid.  Since I last saw him he has had recurrence of pain in his thoracic region including his ribs. He underwent a CTA that did not show a PE on 11/26/14. He is not having fevers but his pain in his ribs and lower back is much worse.  Past Medical History  Diagnosis Date  . [place]rial [place]   . Hypertension   . Pacemaker   . Arthritis   . Anxiety   . Depression   . MSSA (methicillin susceptible Staphylococcus aureus) infection   . Bacteremia   . Infected pacemaker ([place]   . Septic hip ([place]   . Complication of anesthesia     " Terrible Nightmares"  . Dysrhythmia     Afib- had pacemaker, had it removed- takes Aspirin 325- daily  . Diabetes mellitus without complication ([place]     Type II  . Constipation   . History of blood product transfusion   . OAB (overactive bladder)   . CKD (chronic kidney disease)   . Hypothyroidism     Past Surgical History  Procedure Laterality Date  . Pacemaker insertion    . Lumbar laminectomy/decompression microdiscectomy  02/14/2012    Procedure: LUMBAR LAMINECTOMY/DECOMPRESSION MICRODISCECTOMY 1 LEVEL;  Surgeon: Eustace Moore, MD;  Location: [facility];  Service: Neurosurgery;  Laterality: N/A;  Thoracic twelve - Lumbar one decompressive laminectomy.  Darden Dates without cardioversion N/A 08/30/2012    Procedure: TRANSESOPHAGEAL ECHOCARDIOGRAM (TEE);  Surgeon: Larey Dresser, MD;  Location: [place];  Service: Cardiovascular;  Laterality: N/A;  . Icd lead removal Left 08/31/2012    Procedure: ICD LEAD REMOVAL;  Surgeon: Evans Lance, MD;  Location: [place];  Service: Cardiovascular;  Laterality: Left;  . Incision and  drainage hip Right 09/02/2012    Procedure: IRRIGATION AND DEBRIDEMENT RIGHT HIP;  Surgeon: Kerin Salen, MD;  Location: [place];  Service: Orthopedics;  Laterality: Right;  . Total hip revision Right 09/02/2012    Procedure: REVISION OF POLYETHYLENE LINER AND FEMORAL HEAD ;  Surgeon: Kerin Salen, MD;  Location: [place];  Service: Orthopedics;  Laterality: Right;  . Hip closed reduction Right 10/02/2012    Procedure: CLOSED REDUCTION HIP;  Surgeon: Hessie Dibble, MD;  Location: [place];  Service: Orthopedics;  Laterality: Right;  . Total hip revision Right 11/28/2012    Procedure: TOTAL HIP REVISION With Placement of Contrain Liner;  Surgeon: Kerin Salen, MD;  Location: [place];  Service: Orthopedics;  Laterality: Right;  . Colonoscopy w/ polypectomy      small  . Incision and drainage hip Right 08/10/2014    Procedure: IRRIGATION AND DEBRIDEMENT RIGHT HIP SUPERFICIAL VS DEEP;  Surgeon: Frederik Pear, MD;  Location: [place];  Service: Orthopedics;  Laterality: Right;    No family history on file.    Social History   Social History  . Marital Status: Married    Spouse Name: N/A  . Number of Children: N/A  . Years of Education: N/A   Social History Main Topics  . Smoking status: Never Smoker   . Smokeless tobacco: Never Used  . Alcohol Use: No  . Drug Use: No  . Sexual Activity: Not Asked   Other Topics Concern  . None   Social History Narrative    Allergies  Allergen Reactions  . Ambien [Zolpidem]     hallucinations     Current outpatient prescriptions:  .  ALPRAZolam (XANAX) 0.5 MG tablet, Take 0.5 mg by mouth as needed for anxiety. , Disp: , Rfl:  .  aspirin EC 325 MG tablet, Take 1 tablet (325 mg total) by mouth 2 (two) times daily. (Patient taking differently: Take 325 mg by mouth daily. ), Disp: 30 tablet, Rfl: 0 .  cephALEXin (KEFLEX) 500 MG capsule, Take 2 capsules (1,000 mg total) by mouth 2 (two) times daily. Two capsules (1,000 mg total) in the morning and one capsule  (500 mg) at night., Disp: 270 capsule, Rfl: 3 .  diltiazem (CARDIZEM CD) 120 MG 24 hr capsule, Take 120 mg by mouth daily., Disp: , Rfl:  .  docusate sodium 100 MG CAPS, Take 100 mg by mouth 2 (two) times daily., Disp: 10 capsule, Rfl: 0 .  ferrous sulfate 325 (65 FE) MG tablet, Take 325 mg by mouth daily with breakfast., Disp: , Rfl:  .  finasteride (PROSCAR) 5 MG tablet, Take 5 mg by mouth., Disp: , Rfl:  .  gabapentin (NEURONTIN) 300 MG capsule, take 1 capsule by mouth three times a day, Disp: , Rfl: 0 .  HYDROcodone-acetaminophen (NORCO/VICODIN) 5-325 MG tablet, take 1 tablet by mouth twice a day if needed, Disp: , Rfl:  0 .  lactulose (CHRONULAC) 10 GM/15ML solution, take 30 milliliters by mouth once daily, Disp: , Rfl: 1 .  levothyroxine (SYNTHROID, LEVOTHROID) 88 MCG tablet, Take 88 mcg by mouth daily before breakfast., Disp: , Rfl:  .  lovastatin (MEVACOR) 40 MG tablet, Take 40 mg by mouth daily., Disp: , Rfl:  .  metolazone (ZAROXOLYN) 5 MG tablet, Take 2.5 mg by mouth. M,W,F as needed for fluid retention, Disp: , Rfl:  .  metoprolol succinate (TOPROL-XL) 25 MG 24 hr tablet, Take 12.5 mg by mouth daily., Disp: , Rfl:  .  ONE TOUCH ULTRA TEST test strip, , Disp: , Rfl:  .  oxybutynin (DITROPAN) 5 MG tablet, Take 2.5 mg by mouth 2 (two) times daily., Disp: , Rfl: 0 .  pioglitazone (ACTOS) 15 MG tablet, Take 15 mg by mouth daily. , Disp: , Rfl:  .  potassium chloride SA (K-DUR,KLOR-CON) 20 MEQ tablet, Take 1 tablet (20 mEq total) by mouth daily. (Patient taking differently: Take 20 mEq by mouth 2 (two) times daily. ), Disp: 3 tablet, Rfl: 0 .  terazosin (HYTRIN) 5 MG capsule, Take 5 mg by mouth at bedtime. , Disp: , Rfl:  .  torsemide (DEMADEX) 20 MG tablet, Take 20 mg by mouth daily. , Disp: , Rfl:  .  diazepam (VALIUM) 5 MG tablet, Take 1 tablet (5 mg total) by mouth every 8 (eight) hours as needed for anxiety., Disp: 15 tablet, Rfl: 0 .  MYRBETRIQ 50 MG TB24 tablet, Take 50 mg by mouth  daily., Disp: , Rfl:  .  Probiotic Product (PROBIOTIC DAILY PO), Take by mouth., Disp: , Rfl:  .  tiZANidine (ZANAFLEX) 2 MG tablet, Take 1 tablet (2 mg total) by mouth every 6 (six) hours as needed for muscle spasms. (Patient not taking: Reported on 11/28/2014), Disp: 60 tablet, Rfl: 0   Review of Systems  Constitutional: Negative for chills, diaphoresis, activity change, appetite change, fatigue and unexpected weight change.  HENT: Negative for sore throat.   Eyes: Negative for visual disturbance.  Respiratory: Negative for cough and chest tightness.   Gastrointestinal: Negative for nausea, vomiting, abdominal pain, diarrhea, constipation, blood in stool, abdominal distention and anal bleeding.  Genitourinary: Negative for dysuria, hematuria, flank pain and difficulty urinating.  Musculoskeletal: Positive for back pain, arthralgias and gait problem. Negative for joint swelling.  Skin: Negative for color change, pallor, rash and wound.  Neurological: Positive for weakness. Negative for dizziness, tremors and light-headedness.  Hematological: Negative for adenopathy. Does not bruise/bleed easily.  Psychiatric/Behavioral: Negative for behavioral problems, confusion, sleep disturbance, dysphoric mood, decreased concentration and agitation.       Objective:   Physical Exam  Constitutional: He is oriented to person, place, and time. He appears well-developed and well-nourished. No distress.  HENT:  Head: Normocephalic and atraumatic.  Mouth/Throat: Oropharynx is clear and moist. No oropharyngeal exudate.  Eyes: Conjunctivae and EOM are normal. No scleral icterus.  Neck: Normal range of motion. Neck supple.  Cardiovascular: Normal rate, regular rhythm and normal heart sounds.   Pulmonary/Chest: Effort normal and breath sounds normal. No respiratory distress. He has no wheezes. He has no rales. He exhibits no tenderness.  Abdominal: He exhibits no distension. There is tenderness. There is no  rebound and no guarding.  Musculoskeletal: He exhibits no tenderness.       Back:  Neurological: He is alert and oriented to person, place, and time. He exhibits normal muscle tone.  Skin: Skin is warm and dry. He is  not diaphoretic. No erythema. No pallor.  Psychiatric: He has a normal mood and affect. His behavior is normal. Judgment and thought content normal.          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 Placement of acetabular polymethylmethacrylate spacer  And having completed nearly 6 weeks of IV cefazolin now on protracted keflex but with   NEW Thoracic area pain and rib pain  --recheck ESR, CRP and check MRI of the T and L spine   I spent greater than 40 minutes with the patient including greater than 50% of time in face to face counsel of the patient regarding his MSSA bacteremia pacemaker infection retroperitoneal abscess and recurrent prosthetic hip infection, thoracic spine, lumbar pain, rib pain  and in coordination of his care.

## 2014-11-29 LAB — SEDIMENTATION RATE: SED RATE: 19 mm/h (ref 0–20)

## 2014-11-30 ENCOUNTER — Telehealth: Payer: Self-pay | Admitting: *Deleted

## 2014-11-30 NOTE — Telephone Encounter (Signed)
MRI Thoracic and Lumbar with contrast approved [id number], [phone]) valid through 01/14/15. RN [facility] the [facility] to [facility], they requested the patient call to schedule the appointment.  RN notified patient, he will call [phone] today to schedule his MRIs. Andree CossHowell, Michelle M, RN

## 2014-12-13 ENCOUNTER — Ambulatory Visit
Admission: RE | Admit: 2014-12-13 | Discharge: 2014-12-13 | Disposition: A | Discharge status: Home / Self Care | Payer: Medicare Other | Source: Ambulatory Visit | Attending: Infectious Disease | Admitting: Infectious Disease

## 2014-12-13 DIAGNOSIS — M4805 Spinal stenosis, thoracolumbar region: Secondary | ICD-10-CM

## 2014-12-14 ENCOUNTER — Telehealth: Payer: Self-pay | Admitting: *Deleted

## 2014-12-14 NOTE — Telephone Encounter (Signed)
Patient's wife called to state he did not get the MRI because he could not lie flat. She said he would probably need pain medication and valium in order to have it done. [name]

## 2014-12-14 NOTE — Telephone Encounter (Signed)
Doesn't he already have valium that I had prescribed as well as pain medcations. Perhaps he needs this done with anesthesia with radiology?

## 2014-12-17 NOTE — Telephone Encounter (Signed)
His wife was given a hand-written prescription for valium at their visit.  No request for pain medication at that time.

## 2014-12-17 NOTE — Telephone Encounter (Signed)
Ok very good.

## 2014-12-18 ENCOUNTER — Other Ambulatory Visit: Payer: Self-pay | Admitting: *Deleted

## 2014-12-18 ENCOUNTER — Telehealth: Payer: Self-pay | Admitting: *Deleted

## 2014-12-18 DIAGNOSIS — Z8739 Personal history of other diseases of the musculoskeletal system and connective tissue: Secondary | ICD-10-CM

## 2014-12-18 NOTE — Telephone Encounter (Signed)
Pt requesting pain medication change, Vicodin to Percocet.   Percocet works better on his pain.  Please advise.

## 2014-12-18 NOTE — Telephone Encounter (Signed)
Pt's wife given MRI appointment information.  Wife verbalized back the appointment place, date and time.

## 2014-12-18 NOTE — Telephone Encounter (Signed)
I am NOT managing his pain meds. He needs to get with person that is doing that--not me

## 2014-12-18 NOTE — Telephone Encounter (Signed)
Orders placed for MRI Thoracic and Lumbar with/without contrast, with anesthesia.  [facility].  Short stay will be calling patient to confirm arrival times.  Insurance valid through 12/19.  Location does not matter.  RN called patient and wife.  Left message, asking wife to return my call and confirm. Please cosign the orders for MRI.  Please advise if these orders need to be changed. Andree CossHowell, Wilman Tucker M, RN

## 2014-12-18 NOTE — Telephone Encounter (Signed)
Looks good thanks [employer] FinancialMichelle

## 2014-12-19 NOTE — Telephone Encounter (Signed)
Patient wife aware that Dr Daiva EvesVan Dam is not managing the patient pain. She will call the PCP.

## 2015-01-04 ENCOUNTER — Telehealth: Payer: Self-pay | Admitting: Infectious Disease

## 2015-01-04 ENCOUNTER — Encounter: Payer: Self-pay | Admitting: Infectious Disease

## 2015-01-04 ENCOUNTER — Encounter (HOSPITAL_COMMUNITY): Payer: Self-pay

## 2015-01-04 ENCOUNTER — Encounter (HOSPITAL_COMMUNITY)
Admission: RE | Admit: 2015-01-04 | Discharge: 2015-01-04 | Disposition: A | Discharge status: Home / Self Care | Payer: Medicare Other | Source: Ambulatory Visit | Attending: Internal Medicine | Admitting: Internal Medicine

## 2015-01-04 DIAGNOSIS — Z01812 Encounter for preprocedural laboratory examination: Secondary | ICD-10-CM | POA: Insufficient documentation

## 2015-01-04 DIAGNOSIS — E1122 Type 2 diabetes mellitus with diabetic chronic kidney disease: Secondary | ICD-10-CM | POA: Insufficient documentation

## 2015-01-04 DIAGNOSIS — Z79899 Other long term (current) drug therapy: Secondary | ICD-10-CM | POA: Diagnosis not present

## 2015-01-04 DIAGNOSIS — E039 Hypothyroidism, unspecified: Secondary | ICD-10-CM | POA: Diagnosis not present

## 2015-01-04 DIAGNOSIS — I129 Hypertensive chronic kidney disease with stage 1 through stage 4 chronic kidney disease, or unspecified chronic kidney disease: Secondary | ICD-10-CM | POA: Insufficient documentation

## 2015-01-04 DIAGNOSIS — Z8739 Personal history of other diseases of the musculoskeletal system and connective tissue: Secondary | ICD-10-CM | POA: Diagnosis not present

## 2015-01-04 DIAGNOSIS — I4891 Unspecified atrial fibrillation: Secondary | ICD-10-CM | POA: Diagnosis not present

## 2015-01-04 DIAGNOSIS — Z01818 Encounter for other preprocedural examination: Secondary | ICD-10-CM | POA: Diagnosis present

## 2015-01-04 DIAGNOSIS — N189 Chronic kidney disease, unspecified: Secondary | ICD-10-CM | POA: Diagnosis not present

## 2015-01-04 DIAGNOSIS — Z7982 Long term (current) use of aspirin: Secondary | ICD-10-CM | POA: Diagnosis not present

## 2015-01-04 LAB — BASIC METABOLIC PANEL
Anion gap: 8 (ref 5–15)
BUN: 17 mg/dL (ref 6–20)
CHLORIDE: 109 mmol/L (ref 101–111)
CO2: 24 mmol/L (ref 22–32)
CREATININE: 1.28 mg/dL — AB (ref 0.61–1.24)
Calcium: 9.4 mg/dL (ref 8.9–10.3)
GFR calc Af Amer: 58 mL/min — ABNORMAL LOW (ref >60)
GFR calc non Af Amer: 50 mL/min — ABNORMAL LOW (ref >60)
Glucose, Bld: 126 mg/dL — ABNORMAL HIGH (ref 65–99)
Potassium: 4.6 mmol/L (ref 3.5–5.1)
SODIUM: 141 mmol/L (ref 135–145)

## 2015-01-04 LAB — CBC
HEMATOCRIT: 37.8 % — AB (ref 39.0–52.0)
Hemoglobin: 11.9 g/dL — ABNORMAL LOW (ref 13.0–17.0)
MCH: 30 pg (ref 26.0–34.0)
MCHC: 31.5 g/dL (ref 30.0–36.0)
MCV: 95.2 fL (ref 78.0–100.0)
PLATELETS: 122 10*3/uL — AB (ref 150–400)
RBC: 3.97 MIL/uL — AB (ref 4.22–5.81)
RDW: 14.7 % (ref 11.5–15.5)
WBC: 5.3 10*3/uL (ref 4.0–10.5)

## 2015-01-04 LAB — GLUCOSE, CAPILLARY: Glucose-Capillary: 94 mg/dL (ref 65–99)

## 2015-01-04 NOTE — Progress Notes (Addendum)
Patient evidentally was to have MRI earlier, but was unable to tolerate the machine and had to be rescheduled.   HH is unchanged He sees Secondary school teacherChristine Thornsbury [facility].  He was last seen there (per office staff) end of last yr.   I did call them @  [phone] and request any recent notes. I spoke with Dr. Daiva EvesVan Dam, and he is going to do an updated H & P.  DA

## 2015-01-04 NOTE — Telephone Encounter (Signed)
[facility]   Note: this H&P is per Anesthesia guidelines.  Patient is to have an MRI next week. I last examined him on November 2nd, 2016 and feel he needs this MRI performed.  I see of NO reason why his MRI should not be performed.  Below is my note and detailed reason for the MRI from earl November   H&P    Subjective:    Patient ID: [name], male    DOB: [birth year], 81 y.o.   MRN: [id number]     81 y.o. male with Pacemaker, Right prosthetic hip, Laminectomy in January of 2014 admitted in August 2014 with right leg pain weakness fevers. He has been found to have MSSA bacteremia and a large retroperitoneal abscess that is adjacent to his prosthetic hip.  Orthopedics performed resection of Radical irrigation and debridement right total hip with removal of all synovium, revision of femoral head, revision of polyethylene liner placement On 09/02/12  TEE failed to show vegetations on valves or overtly on PM. PM is out. He returned to the hospital with dislocation of his hip. This was able to be reduced without surgical intervention. My partner Dr. Johnnye Sima saw the patient and noted his sedimentation rate and C-reactive protein is still be quite high. Patient completed a course of  8 weeks of cefazolin 2 g IV every 8 hours at the skilled nursing facility along with  oral rifampin twice daily.   I changed him to oral Keflex when his IV antibiotics were stopped and he continued on high-dose Keflex with rifampin since then.   He had recurrent hip dislocations since then.  He was readmitted in late October 2014 and underwent  Irrigation and debridement of infected right total hip with removal of all synovium, revision of femoral head to a +5 40 mm metal head, revision of polyethylene liner to a constrained liner  He had remained on keflex 564m po tid in the interim but stopped the rifampin.  His hip pain  dissapeared but he then developed pain in the  right leg itself and difficulty raising leg vs gravity though he can bear weight on it in fall of 2015.  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 Placement of acetabular polymethylmethacrylate spacer   By Dr. RMayer Camelon July 16th, 2016.  intraop cultures yielded no organism but he was on keflex (and rifampin prior to surgery). He had  been placed back on Ancef IV and then transitioned back to oral keflex currently at 1 g po bid.  Since I last saw him he has had recurrence of pain in his thoracic region including his ribs. He underwent a CTA that did not show a PE on 11/26/14. He is not having fevers but his pain in his ribs and lower back is much worse.  Past Medical History  Diagnosis Date  . Atrial fibrillation ([place]   . Hypertension   . Pacemaker   . Arthritis   . Anxiety   . Depression   . MSSA (methicillin susceptible Staphylococcus aureus) infection   . Bacteremia   . Infected pacemaker ([place]   . Septic hip ([place]   . Complication of anesthesia     " Terrible Nightmares"  . Dysrhythmia  Afib- had pacemaker, had it removed- takes Aspirin 325- daily  . Diabetes mellitus without complication ([place])     Type II  . Constipation   . History of blood product transfusion   . OAB (overactive bladder)   . CKD (chronic kidney disease)   . Hypothyroidism   . Thoracic spine pain 11/28/2014    Past Surgical History  Procedure Laterality Date  . Pacemaker insertion    . Lumbar laminectomy/decompression microdiscectomy  02/14/2012    Procedure: LUMBAR LAMINECTOMY/DECOMPRESSION MICRODISCECTOMY 1 LEVEL;  Surgeon: Eustace Moore, MD;  Location: [facility];  Service: Neurosurgery;  Laterality: N/A;  Thoracic twelve - Lumbar one decompressive  laminectomy.  Darden Dates without cardioversion N/A 08/30/2012    Procedure: TRANSESOPHAGEAL ECHOCARDIOGRAM (TEE);  Surgeon: Larey Dresser, MD;  Location: [facility];  Service: Cardiovascular;  Laterality: N/A;  . Icd lead removal Left 08/31/2012    Procedure: ICD LEAD REMOVAL;  Surgeon: Evans Lance, MD;  Location: [facility];  Service: Cardiovascular;  Laterality: Left;  . Incision and drainage hip Right 09/02/2012    Procedure: IRRIGATION AND DEBRIDEMENT RIGHT HIP;  Surgeon: Kerin Salen, MD;  Location: [facility];  Service: Orthopedics;  Laterality: Right;  . Total hip revision Right 09/02/2012    Procedure: REVISION OF POLYETHYLENE LINER AND FEMORAL HEAD ;  Surgeon: Kerin Salen, MD;  Location: [facility];  Service: Orthopedics;  Laterality: Right;  . Hip closed reduction Right 10/02/2012    Procedure: CLOSED REDUCTION HIP;  Surgeon: Hessie Dibble, MD;  Location: [facility];  Service: Orthopedics;  Laterality: Right;  . Total hip revision Right 11/28/2012    Procedure: TOTAL HIP REVISION With Placement of Contrain Liner;  Surgeon: Kerin Salen, MD;  Location: [facility];  Service: Orthopedics;  Laterality: Right;  . Colonoscopy w/ polypectomy      small  . Incision and drainage hip Right 08/10/2014    Procedure: IRRIGATION AND DEBRIDEMENT RIGHT HIP SUPERFICIAL VS DEEP;  Surgeon: Frederik Pear, MD;  Location: [facility];  Service: Orthopedics;  Laterality: Right;  . Tonsillectomy    . Eye surgery      bilateral  . Joint replacement      No family history on file.    Social History   Social History  . Marital Status: Married    Spouse Name: N/A  . Number of Children: N/A  . Years of Education: N/A   Social History Main Topics  . Smoking status: Never Smoker   . Smokeless tobacco: Never Used  . Alcohol Use: No  . Drug Use: No  . Sexual Activity: Not on file   Other Topics Concern  . Not on file   Social History Narrative    Allergies  Allergen Reactions  . Ambien [Zolpidem] Other (See Comments)     hallucinations     Current outpatient prescriptions:  .  acetaminophen (TYLENOL) 500 MG tablet, Take 750 mg by mouth daily as needed (pain)., Disp: , Rfl:  .  ALPRAZolam (XANAX) 0.5 MG tablet, Take 0.5 mg by mouth daily as needed for anxiety. , Disp: , Rfl:  .  aspirin EC 325 MG tablet, Take 1 tablet (325 mg total) by mouth 2 (two) times daily. (Patient taking differently: Take 325 mg by mouth daily. ), Disp: 30 tablet, Rfl: 0 .  cephALEXin (KEFLEX) 500 MG capsule, Take 2 capsules (1,000 mg total) by mouth 2 (two) times daily. Two capsules (1,000 mg total) in the morning and one capsule (  500 mg) at night. (Patient taking differently: Take 1,000 mg by mouth 2 (two) times daily. Continuous course), Disp: 270 capsule, Rfl: 3 .  diazepam (VALIUM) 5 MG tablet, Take 1 tablet (5 mg total) by mouth every 8 (eight) hours as needed for anxiety. (Patient not taking: Reported on 01/04/2015), Disp: 15 tablet, Rfl: 0 .  diltiazem (CARDIZEM CD) 120 MG 24 hr capsule, Take 120 mg by mouth daily., Disp: , Rfl:  .  docusate sodium 100 MG CAPS, Take 100 mg by mouth 2 (two) times daily. (Patient not taking: Reported on 01/04/2015), Disp: 10 capsule, Rfl: 0 .  ferrous sulfate 325 (65 FE) MG tablet, Take 325 mg by mouth daily with breakfast., Disp: , Rfl:  .  finasteride (PROSCAR) 5 MG tablet, Take 5 mg by mouth at bedtime. , Disp: , Rfl:  .  HYDROcodone-acetaminophen (NORCO/VICODIN) 5-325 MG tablet, Take 1 tablet by mouth 2 (two) times daily as needed (pain)., Disp: , Rfl:  .  lactulose (CHRONULAC) 10 GM/15ML solution, Take 20 g by mouth daily., Disp: , Rfl:  .  levothyroxine (SYNTHROID, LEVOTHROID) 88 MCG tablet, Take 88 mcg by mouth daily before breakfast., Disp: , Rfl:  .  lovastatin (MEVACOR) 40 MG tablet, Take 40 mg by mouth at bedtime. , Disp: , Rfl:  .  metolazone (ZAROXOLYN) 5 MG tablet, Take 2.5 mg by mouth every Monday, Wednesday, and Friday. , Disp: , Rfl:  .  metoprolol succinate (TOPROL-XL) 25 MG 24 hr  tablet, Take 12.5 mg by mouth daily., Disp: , Rfl:  .  ONE TOUCH ULTRA TEST test strip, , Disp: , Rfl:  .  oxyCODONE (OXY IR/ROXICODONE) 5 MG immediate release tablet, Take 5 mg by mouth daily as needed for severe pain (pain)., Disp: , Rfl:  .  pioglitazone (ACTOS) 15 MG tablet, Take 15 mg by mouth daily. , Disp: , Rfl:  .  potassium chloride SA (K-DUR,KLOR-CON) 20 MEQ tablet, Take 1 tablet (20 mEq total) by mouth daily. (Patient taking differently: Take 20 mEq by mouth 2 (two) times daily. ), Disp: 3 tablet, Rfl: 0 .  Probiotic Product (PROBIOTIC DAILY PO), Take 1 capsule by mouth daily. , Disp: , Rfl:  .  terazosin (HYTRIN) 5 MG capsule, Take 5 mg by mouth at bedtime. , Disp: , Rfl:  .  tiZANidine (ZANAFLEX) 2 MG tablet, Take 1 tablet (2 mg total) by mouth every 6 (six) hours as needed for muscle spasms. (Patient not taking: Reported on 11/28/2014), Disp: 60 tablet, Rfl: 0 .  torsemide (DEMADEX) 20 MG tablet, Take 20 mg by mouth daily. , Disp: , Rfl:    Review of Systems  Constitutional: Negative for chills, diaphoresis, activity change, appetite change, fatigue and unexpected weight change.  HENT: Negative for sore throat.   Eyes: Negative for visual disturbance.  Respiratory: Negative for cough and chest tightness.   Gastrointestinal: Negative for nausea, vomiting, abdominal pain, diarrhea, constipation, blood in stool, abdominal distention and anal bleeding.  Genitourinary: Negative for dysuria, hematuria, flank pain and difficulty urinating.  Musculoskeletal: Positive for back pain, arthralgias and gait problem. Negative for joint swelling.  Skin: Negative for color change, pallor, rash and wound.  Neurological: Positive for weakness. Negative for dizziness, tremors and light-headedness.  Hematological: Negative for adenopathy. Does not bruise/bleed easily.  Psychiatric/Behavioral: Negative for behavioral problems, confusion, sleep disturbance, dysphoric mood, decreased concentration and  agitation.       Objective:   Physical Exam  Constitutional: He is oriented to person, place, and  time. He appears well-developed and well-nourished. No distress.  HENT:  Head: Normocephalic and atraumatic.  Mouth/Throat: Oropharynx is clear and moist. No oropharyngeal exudate.  Eyes: Conjunctivae and EOM are normal. No scleral icterus.  Neck: Normal range of motion. Neck supple.  Cardiovascular: Normal rate, regular rhythm and normal heart sounds.   Pulmonary/Chest: Effort normal and breath sounds normal. No respiratory distress. He has no wheezes. He has no rales. He exhibits no tenderness.  Abdominal: He exhibits no distension. There is tenderness. There is no rebound and no guarding.  Musculoskeletal: He exhibits no tenderness.       Back:  Neurological: He is alert and oriented to person, place, and time. He exhibits normal muscle tone.  Skin: Skin is warm and dry. He is not diaphoretic. No erythema. No pallor.  Psychiatric: He has a normal mood and affect. His behavior is normal. Judgment and thought content normal.          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 Placement of acetabular polymethylmethacrylate spacer  And having completed nearly 6 weeks of IV cefazolin now on protracted keflex but with   NEW Thoracic area pain and rib pain  --recheck ESR, CRP and check MRI of the T and L spine

## 2015-01-04 NOTE — Pre-Procedure Instructions (Signed)
[name]  01/04/2015      [facility] [place] - [id number] [place] [address] [place] [id number] Phone: [phone] Fax: [phone]  [facility] [address] [place], [place] - [address] [place] [address] Phone: [phone] Fax: [phone]    Your procedure is scheduled on Thursday, Dec. 15th .  [facility] 8:00 AM  Call this number if you have problems the morning of surgery:  [phone]   Remember:  Do not eat food or drink liquids after midnight Wednesday.  Take these medicines the morning of surgery with A SIP OF WATER Xanax, Cardiazem, Synthroid, Toprol XL, Zanaflex   Do not wear jewelry or colognes   You may NOT wear deodorant the day of surgery    Men may shave face and neck.  Do not bring valuables to the hospital.  [facility] is not responsible for any belongings or valuables.  Contacts, dentures or bridgework may not be worn into surgery.  Leave your suitcase in the car.  After surgery it may be brought to your room.  For patients admitted to the hospital, discharge time will be determined by your treatment team.  Patients discharged the day of surgery will not be allowed to drive home.   Name and phone number of your driver:   [name]    grandson  Please read over the following fact sheets that you were given. Pain Booklet
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
## 2015-01-10 ENCOUNTER — Ambulatory Visit (HOSPITAL_COMMUNITY): Payer: Medicare Other | Admitting: Vascular Surgery

## 2015-01-10 ENCOUNTER — Inpatient Hospital Stay (HOSPITAL_COMMUNITY)
Admission: AD | Admit: 2015-01-10 | Discharge: 2015-01-14 | DRG: 552 | Disposition: A | Discharge status: Home Health | Payer: Medicare Other | Source: Ambulatory Visit | Attending: Internal Medicine | Admitting: Internal Medicine

## 2015-01-10 ENCOUNTER — Ambulatory Visit (HOSPITAL_COMMUNITY): Payer: Medicare Other | Admitting: Anesthesiology

## 2015-01-10 ENCOUNTER — Encounter (HOSPITAL_COMMUNITY)
Admission: AD | Disposition: A | Discharge status: Home Health | Payer: Self-pay | Source: Ambulatory Visit | Attending: Internal Medicine

## 2015-01-10 ENCOUNTER — Encounter (HOSPITAL_COMMUNITY): Payer: Self-pay | Admitting: *Deleted

## 2015-01-10 ENCOUNTER — Ambulatory Visit (HOSPITAL_COMMUNITY): Payer: Medicare Other

## 2015-01-10 ENCOUNTER — Ambulatory Visit (HOSPITAL_COMMUNITY)
Admission: RE | Admit: 2015-01-10 | Discharge: 2015-01-10 | Disposition: A | Discharge status: Home / Self Care | Payer: Medicare Other | Source: Ambulatory Visit | Attending: Infectious Disease | Admitting: Infectious Disease

## 2015-01-10 DIAGNOSIS — I4891 Unspecified atrial fibrillation: Secondary | ICD-10-CM | POA: Diagnosis present

## 2015-01-10 DIAGNOSIS — Z8739 Personal history of other diseases of the musculoskeletal system and connective tissue: Secondary | ICD-10-CM

## 2015-01-10 DIAGNOSIS — M4324 Fusion of spine, thoracic region: Secondary | ICD-10-CM | POA: Insufficient documentation

## 2015-01-10 DIAGNOSIS — M4645 Discitis, unspecified, thoracolumbar region: Secondary | ICD-10-CM | POA: Diagnosis not present

## 2015-01-10 DIAGNOSIS — I129 Hypertensive chronic kidney disease with stage 1 through stage 4 chronic kidney disease, or unspecified chronic kidney disease: Secondary | ICD-10-CM | POA: Diagnosis present

## 2015-01-10 DIAGNOSIS — M4644 Discitis, unspecified, thoracic region: Secondary | ICD-10-CM

## 2015-01-10 DIAGNOSIS — Z888 Allergy status to other drugs, medicaments and biological substances status: Secondary | ICD-10-CM | POA: Diagnosis not present

## 2015-01-10 DIAGNOSIS — F419 Anxiety disorder, unspecified: Secondary | ICD-10-CM | POA: Diagnosis present

## 2015-01-10 DIAGNOSIS — Z7982 Long term (current) use of aspirin: Secondary | ICD-10-CM

## 2015-01-10 DIAGNOSIS — R0781 Pleurodynia: Secondary | ICD-10-CM | POA: Insufficient documentation

## 2015-01-10 DIAGNOSIS — N3281 Overactive bladder: Secondary | ICD-10-CM | POA: Diagnosis present

## 2015-01-10 DIAGNOSIS — Z981 Arthrodesis status: Secondary | ICD-10-CM | POA: Diagnosis not present

## 2015-01-10 DIAGNOSIS — N183 Chronic kidney disease, stage 3 unspecified: Secondary | ICD-10-CM | POA: Diagnosis present

## 2015-01-10 DIAGNOSIS — I10 Essential (primary) hypertension: Secondary | ICD-10-CM | POA: Diagnosis present

## 2015-01-10 DIAGNOSIS — Z789 Other specified health status: Secondary | ICD-10-CM

## 2015-01-10 DIAGNOSIS — E785 Hyperlipidemia, unspecified: Secondary | ICD-10-CM | POA: Diagnosis present

## 2015-01-10 DIAGNOSIS — D638 Anemia in other chronic diseases classified elsewhere: Secondary | ICD-10-CM | POA: Diagnosis present

## 2015-01-10 DIAGNOSIS — E1122 Type 2 diabetes mellitus with diabetic chronic kidney disease: Secondary | ICD-10-CM | POA: Diagnosis present

## 2015-01-10 DIAGNOSIS — E039 Hypothyroidism, unspecified: Secondary | ICD-10-CM | POA: Diagnosis present

## 2015-01-10 DIAGNOSIS — N400 Enlarged prostate without lower urinary tract symptoms: Secondary | ICD-10-CM | POA: Diagnosis present

## 2015-01-10 DIAGNOSIS — M546 Pain in thoracic spine: Secondary | ICD-10-CM

## 2015-01-10 DIAGNOSIS — M4805 Spinal stenosis, thoracolumbar region: Secondary | ICD-10-CM

## 2015-01-10 DIAGNOSIS — M4326 Fusion of spine, lumbar region: Secondary | ICD-10-CM | POA: Insufficient documentation

## 2015-01-10 DIAGNOSIS — E1165 Type 2 diabetes mellitus with hyperglycemia: Secondary | ICD-10-CM | POA: Diagnosis present

## 2015-01-10 DIAGNOSIS — M4624 Osteomyelitis of vertebra, thoracic region: Secondary | ICD-10-CM

## 2015-01-10 DIAGNOSIS — M4322 Fusion of spine, cervical region: Secondary | ICD-10-CM

## 2015-01-10 DIAGNOSIS — Z79899 Other long term (current) drug therapy: Secondary | ICD-10-CM

## 2015-01-10 DIAGNOSIS — D631 Anemia in chronic kidney disease: Secondary | ICD-10-CM | POA: Diagnosis present

## 2015-01-10 DIAGNOSIS — F329 Major depressive disorder, single episode, unspecified: Secondary | ICD-10-CM | POA: Diagnosis present

## 2015-01-10 DIAGNOSIS — M4625 Osteomyelitis of vertebra, thoracolumbar region: Secondary | ICD-10-CM | POA: Diagnosis not present

## 2015-01-10 DIAGNOSIS — M4649 Discitis, unspecified, multiple sites in spine: Secondary | ICD-10-CM | POA: Diagnosis present

## 2015-01-10 DIAGNOSIS — E118 Type 2 diabetes mellitus with unspecified complications: Secondary | ICD-10-CM

## 2015-01-10 DIAGNOSIS — J9811 Atelectasis: Secondary | ICD-10-CM | POA: Insufficient documentation

## 2015-01-10 DIAGNOSIS — J90 Pleural effusion, not elsewhere classified: Secondary | ICD-10-CM

## 2015-01-10 DIAGNOSIS — N189 Chronic kidney disease, unspecified: Secondary | ICD-10-CM

## 2015-01-10 DIAGNOSIS — I120 Hypertensive chronic kidney disease with stage 5 chronic kidney disease or end stage renal disease: Secondary | ICD-10-CM | POA: Diagnosis not present

## 2015-01-10 DIAGNOSIS — Z9889 Other specified postprocedural states: Secondary | ICD-10-CM | POA: Diagnosis not present

## 2015-01-10 DIAGNOSIS — I482 Chronic atrial fibrillation: Secondary | ICD-10-CM | POA: Diagnosis present

## 2015-01-10 DIAGNOSIS — M4640 Discitis, unspecified, site unspecified: Secondary | ICD-10-CM | POA: Diagnosis present

## 2015-01-10 DIAGNOSIS — E119 Type 2 diabetes mellitus without complications: Secondary | ICD-10-CM

## 2015-01-10 HISTORY — PX: RADIOLOGY WITH ANESTHESIA: SHX6223

## 2015-01-10 LAB — CBC
HEMATOCRIT: 31.4 % — AB (ref 39.0–52.0)
Hemoglobin: 10.2 g/dL — ABNORMAL LOW (ref 13.0–17.0)
MCH: 30.5 pg (ref 26.0–34.0)
MCHC: 32.5 g/dL (ref 30.0–36.0)
MCV: 94 fL (ref 78.0–100.0)
PLATELETS: 125 10*3/uL — AB (ref 150–400)
RBC: 3.34 MIL/uL — ABNORMAL LOW (ref 4.22–5.81)
RDW: 15.3 % (ref 11.5–15.5)
WBC: 6.4 10*3/uL (ref 4.0–10.5)

## 2015-01-10 LAB — BASIC METABOLIC PANEL
Anion gap: 6 (ref 5–15)
BUN: 12 mg/dL (ref 6–20)
CO2: 25 mmol/L (ref 22–32)
CREATININE: 1.27 mg/dL — AB (ref 0.61–1.24)
Calcium: 8.9 mg/dL (ref 8.9–10.3)
Chloride: 109 mmol/L (ref 101–111)
GFR calc non Af Amer: 51 mL/min — ABNORMAL LOW (ref >60)
GFR, EST AFRICAN AMERICAN: 59 mL/min — AB (ref >60)
GLUCOSE: 93 mg/dL (ref 65–99)
Potassium: 4.2 mmol/L (ref 3.5–5.1)
Sodium: 140 mmol/L (ref 135–145)

## 2015-01-10 LAB — GLUCOSE, CAPILLARY
Glucose-Capillary: 93 mg/dL (ref 65–99)
Glucose-Capillary: 101 mg/dL — ABNORMAL HIGH (ref 65–99)
Glucose-Capillary: 79 mg/dL (ref 65–99)

## 2015-01-10 LAB — C-REACTIVE PROTEIN: CRP: 1.8 mg/dL — ABNORMAL HIGH (ref <1.0)

## 2015-01-10 LAB — TSH: TSH: 2.763 u[IU]/mL (ref 0.350–4.500)

## 2015-01-10 SURGERY — RADIOLOGY WITH ANESTHESIA
Anesthesia: General

## 2015-01-10 MED ORDER — TERAZOSIN HCL 5 MG PO CAPS
5.0000 mg | ORAL_CAPSULE | Freq: Every day | ORAL | Status: DC
  Administered 2015-01-10 – 2015-01-13 (×4): 5 mg via ORAL
  Filled 2015-01-10 (×6): qty 1

## 2015-01-10 MED ORDER — FERROUS SULFATE 325 (65 FE) MG PO TABS
325.0000 mg | ORAL_TABLET | Freq: Every day | ORAL | Status: DC
  Administered 2015-01-11 – 2015-01-14 (×4): 325 mg via ORAL
  Filled 2015-01-10 (×4): qty 1

## 2015-01-10 MED ORDER — LACTATED RINGERS IV SOLN
INTRAVENOUS | Status: DC
  Administered 2015-01-10: 09:00:00 via INTRAVENOUS

## 2015-01-10 MED ORDER — METOPROLOL SUCCINATE 12.5 MG HALF TABLET
12.5000 mg | ORAL_TABLET | Freq: Every day | ORAL | Status: DC
  Administered 2015-01-11 – 2015-01-14 (×4): 12.5 mg via ORAL
  Filled 2015-01-10 (×4): qty 1

## 2015-01-10 MED ORDER — OXYCODONE HCL 5 MG PO TABS
ORAL_TABLET | ORAL | Status: AC
  Filled 2015-01-10: qty 1

## 2015-01-10 MED ORDER — ONDANSETRON HCL 4 MG PO TABS: 4.0000 mg | ORAL_TABLET | Freq: Four times a day (QID) | ORAL | Status: DC | PRN

## 2015-01-10 MED ORDER — ONDANSETRON HCL 4 MG/2ML IJ SOLN: 4.0000 mg | Freq: Four times a day (QID) | INTRAMUSCULAR | Status: DC | PRN

## 2015-01-10 MED ORDER — FINASTERIDE 5 MG PO TABS
5.0000 mg | ORAL_TABLET | Freq: Every day | ORAL | Status: DC
  Administered 2015-01-10 – 2015-01-13 (×4): 5 mg via ORAL
  Filled 2015-01-10 (×4): qty 1

## 2015-01-10 MED ORDER — ACETAMINOPHEN 650 MG RE SUPP: 650.0000 mg | Freq: Four times a day (QID) | RECTAL | Status: DC | PRN

## 2015-01-10 MED ORDER — INSULIN ASPART 100 UNIT/ML ~~LOC~~ SOLN
0.0000 [IU] | Freq: Three times a day (TID) | SUBCUTANEOUS | Status: DC
  Administered 2015-01-11: 2 [IU] via SUBCUTANEOUS
  Administered 2015-01-12: 3 [IU] via SUBCUTANEOUS

## 2015-01-10 MED ORDER — ACETAMINOPHEN 325 MG PO TABS
650.0000 mg | ORAL_TABLET | Freq: Four times a day (QID) | ORAL | Status: DC | PRN
  Administered 2015-01-12 – 2015-01-13 (×2): 650 mg via ORAL
  Filled 2015-01-10 (×4): qty 2

## 2015-01-10 MED ORDER — GADOBENATE DIMEGLUMINE 529 MG/ML IV SOLN
20.0000 mL | Freq: Once | INTRAVENOUS | Status: AC | PRN
  Administered 2015-01-10: 20 mL via INTRAVENOUS

## 2015-01-10 MED ORDER — LACTULOSE 10 GM/15ML PO SOLN
20.0000 g | Freq: Every day | ORAL | Status: DC
  Administered 2015-01-10 – 2015-01-14 (×5): 20 g via ORAL
  Filled 2015-01-10 (×5): qty 30

## 2015-01-10 MED ORDER — LEVOTHYROXINE SODIUM 88 MCG PO TABS
88.0000 ug | ORAL_TABLET | Freq: Every day | ORAL | Status: DC
  Administered 2015-01-11 – 2015-01-14 (×4): 88 ug via ORAL
  Filled 2015-01-10 (×4): qty 1

## 2015-01-10 MED ORDER — ALPRAZOLAM 0.5 MG PO TABS: 0.5000 mg | ORAL_TABLET | Freq: Every day | ORAL | Status: DC | PRN

## 2015-01-10 MED ORDER — OXYCODONE HCL 5 MG PO TABS
5.0000 mg | ORAL_TABLET | Freq: Every day | ORAL | Status: DC | PRN
  Administered 2015-01-10: 5 mg via ORAL

## 2015-01-10 MED ORDER — DILTIAZEM HCL ER COATED BEADS 120 MG PO CP24
120.0000 mg | ORAL_CAPSULE | Freq: Every day | ORAL | Status: DC
  Administered 2015-01-11 – 2015-01-14 (×4): 120 mg via ORAL
  Filled 2015-01-10 (×4): qty 1

## 2015-01-10 MED ORDER — PRAVASTATIN SODIUM 10 MG PO TABS
10.0000 mg | ORAL_TABLET | Freq: Every day | ORAL | Status: DC
  Administered 2015-01-10 – 2015-01-13 (×4): 10 mg via ORAL
  Filled 2015-01-10 (×5): qty 1

## 2015-01-10 MED ORDER — INSULIN ASPART 100 UNIT/ML ~~LOC~~ SOLN: 0.0000 [IU] | Freq: Every day | SUBCUTANEOUS | Status: DC

## 2015-01-10 MED ORDER — SODIUM CHLORIDE 0.9 % IV SOLN
INTRAVENOUS | Status: DC
  Administered 2015-01-10: 23:00:00 via INTRAVENOUS
  Administered 2015-01-11: 1000 mL via INTRAVENOUS
  Administered 2015-01-12: 06:00:00 via INTRAVENOUS

## 2015-01-10 NOTE — Transfer of Care (Signed)
Immediate Anesthesia Transfer of Care Note  Patient: [name]  Procedure(s) Performed: Procedure(s): MRI LUMBAR SPINE WITH/WITHOUT   (RADIOLOGY WITH ANESTHESIA) (N/A)  Patient Location: PACU  Anesthesia Type:General  Level of Consciousness: awake, alert , confused and responds to stimulation  Airway & Oxygen Therapy: Patient Spontanous Breathing and Patient connected to nasal cannula oxygen  Post-op Assessment: Report given to RN and Post -op Vital signs reviewed and stable  Post vital signs: Reviewed and stable  Last Vitals:  Filed Vitals:   01/10/15 0822 01/10/15 1330  BP: 176/83 123/60  Pulse:  67  Temp:  36.7 C  Resp:  17    Complications: No apparent anesthesia complications

## 2015-01-10 NOTE — H&P (Signed)
[facility] History and Physical                                                                                    [name], is a 81 y.o. male  MRN: [id number]   DOB - [birth year]  Admit Date - 01/10/2015  Outpatient Primary MD for the patient is Lilian Coma., MD  Referring MD: Tommy Medal / ER  With History of -  Past Medical History  Diagnosis Date  . Atrial fibrillation ([place])   . Hypertension   . Pacemaker   . Arthritis   . Anxiety   . Depression   . MSSA (methicillin susceptible Staphylococcus aureus) infection   . Bacteremia   . 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 Septic hip ([place])   . Complication of anesthesia     " Terrible Nightmares"  . Dysrhythmia     Afib- had pacemaker, had it removed- takes Aspirin 325- daily  . Diabetes mellitus without complication ([place])     Type II  . Constipation   . History of blood product transfusion   . OAB (overactive bladder)   . CKD (chronic kidney disease)   . Hypothyroidism   . Thoracic spine pain 11/28/2014      Past Surgical History  Procedure Laterality Date  . Pacemaker insertion    . Lumbar laminectomy/decompression microdiscectomy  02/14/2012    Procedure: LUMBAR LAMINECTOMY/DECOMPRESSION MICRODISCECTOMY 1 LEVEL;  Surgeon: Eustace Moore, MD;  Location: [place] [facility];  Service: Neurosurgery;  Laterality: N/A;  Thoracic twelve - Lumbar one decompressive laminectomy.  Darden Dates without cardioversion N/A 08/30/2012    Procedure: TRANSESOPHAGEAL ECHOCARDIOGRAM (TEE);  Surgeon: Larey Dresser, MD;  Location: [place];  Service: Cardiovascular;  Laterality: N/A;  . Icd lead removal Left 08/31/2012    Procedure: ICD LEAD REMOVAL;  Surgeon: Evans Lance, MD;  Location: [place];  Service: Cardiovascular;  Laterality: Left;  . Incision and drainage hip Right 09/02/2012    Procedure: IRRIGATION AND DEBRIDEMENT RIGHT HIP;  Surgeon: Kerin Salen, MD;  Location: [place];  Service: Orthopedics;  Laterality: Right;  . Total hip revision  Right 09/02/2012    Procedure: REVISION OF POLYETHYLENE LINER AND FEMORAL HEAD ;  Surgeon: Kerin Salen, MD;  Location: [place];  Service: Orthopedics;  Laterality: Right;  . Hip closed reduction Right 10/02/2012    Procedure: CLOSED REDUCTION HIP;  Surgeon: Hessie Dibble, MD;  Location: [place];  Service: Orthopedics;  Laterality: Right;  . Total hip revision Right 11/28/2012    Procedure: TOTAL HIP REVISION With Placement of Contrain Liner;  Surgeon: Kerin Salen, MD;  Location: [place];  Service: Orthopedics;  Laterality: Right;  . Colonoscopy w/ polypectomy      small  . Incision and drainage hip Right 08/10/2014    Procedure: IRRIGATION AND DEBRIDEMENT RIGHT HIP SUPERFICIAL VS DEEP;  Surgeon: Frederik Pear, MD;  Location: [place];  Service: Orthopedics;  Laterality: Right;  . Tonsillectomy    . Eye surgery      bilateral  . Joint replacement      in for back pain secondary to thoracolumbar discitis with osteomyelitis  HPI This is an 81 year old male patient with history of chronic atrial fibrillation not on anticoagulation secondary to retroperitoneal hematoma in setting of supratherapeutic INR, hypertension, diabetes and chronic kidney thyroidism and BPH. Patient has a very complex orthopedic history. He underwent right hip replacement in 2012. Developed infected right prosthesis with associated complex fluid collection of the right hip consistent with an abscess in 2014 which required extensive debridement, removal of prosthesis in setting of MSSA bacteremia. Pacemaker was also removed during that admission. Patient has been followed up by infectious disease since that time. He has been with Ancef and Keflex. He had a recurrence of MSSA bacteremia in July 2016 secondary to seeding from right hip prosthesis and once again underwent extensive irrigation and debridement and removal of prosthesis and remains on high-dose Keflex for suppression. In early November patient was complaining of new back pain  without any neurological deficits. Plans were to pursue MRI but patient was unable to tolerate a standard MRI. Today he was brought in to undergo an MRI under anesthesia and this revealed acute discitis and osteomyelitis involving the thoracolumbar spine without evidence of epidural paraspinal abscess. Infectious disease M.D. called [place] to request [place] to admit the patient and to contact interventional radiology to determine if this area could be aspirated for specimen/culture collection. In addition ID/Dr. Tommy Medal recommended holding Keflex until patient has been evaluated by IR so we can obtain our best specimen.  I interviewed the patient in the PACU. He reports he has been having back pain not associated with fevers or chills, no radiculopathy symptoms or weakness in his extremities or numbness. He typically ambulates with a walker.   Review of Systems   In addition to the HPI above,  No Fever-chills, myalgias or other constitutional symptoms No Headache, changes with Vision or hearing, new weakness, tingling, numbness in any extremity, No problems swallowing food or Liquids, indigestion/reflux No Chest pain, Cough or Shortness of Breath, palpitations, orthopnea or DOE No Abdominal pain, N/V; no melena or hematochezia, no dark tarry stools, Bowel movements are regular, No dysuria, hematuria or flank pain No new skin rashes, lesions, masses or bruises, No recent weight gain or loss No polyuria, polydypsia or polyphagia,  *A full 10 point Review of Systems was done, except as stated above, all other Review of Systems were negative.  Social History Social History  Substance Use Topics  . Smoking status: Never Smoker   . Smokeless tobacco: Never Used  . Alcohol Use: No    Resides at: Private residence  Lives with: Spouse  Ambulatory status: Walker   Family History History reviewed. No pertinent family history that would be specific to current admission.   Prior to Admission  medications   Medication Sig Start Date End Date Taking? Authorizing Provider  acetaminophen (TYLENOL) 500 MG tablet Take 750 mg by mouth daily as needed (pain).   Yes Historical Provider, MD  ALPRAZolam Duanne Moron) 0.5 MG tablet Take 0.5 mg by mouth daily as needed for anxiety.    Yes Historical Provider, MD  aspirin EC 325 MG tablet Take 1 tablet (325 mg total) by mouth 2 (two) times daily. Patient taking differently: Take 325 mg by mouth daily.  08/13/14  Yes Leighton Parody, PA-C  cephALEXin (KEFLEX) 500 MG capsule Take 2 capsules (1,000 mg total) by mouth 2 (two) times daily. Two capsules (1,000 mg total) in the morning and one capsule (500 mg) at night. Patient taking differently: Take 1,000 mg by mouth 2 (two) times  daily. Continuous course 11/28/14  Yes Truman Hayward, MD  diltiazem (CARDIZEM CD) 120 MG 24 hr capsule Take 120 mg by mouth daily. 07/09/14  Yes Historical Provider, MD  ferrous sulfate 325 (65 FE) MG tablet Take 325 mg by mouth daily with breakfast.   Yes Historical Provider, MD  finasteride (PROSCAR) 5 MG tablet Take 5 mg by mouth at bedtime.    Yes Historical Provider, MD  HYDROcodone-acetaminophen (NORCO/VICODIN) 5-325 MG tablet Take 1 tablet by mouth 2 (two) times daily as needed (pain).   Yes Historical Provider, MD  lactulose (CHRONULAC) 10 GM/15ML solution Take 20 g by mouth daily.   Yes Historical Provider, MD  levothyroxine (SYNTHROID, LEVOTHROID) 88 MCG tablet Take 88 mcg by mouth daily before breakfast.   Yes Historical Provider, MD  lovastatin (MEVACOR) 40 MG tablet Take 40 mg by mouth at bedtime.  07/09/14  Yes Historical Provider, MD  metolazone (ZAROXOLYN) 5 MG tablet Take 2.5 mg by mouth every Monday, Wednesday, and Friday.    Yes Historical Provider, MD  metoprolol succinate (TOPROL-XL) 25 MG 24 hr tablet Take 12.5 mg by mouth daily. 11/02/14  Yes Historical Provider, MD  oxyCODONE (OXY IR/ROXICODONE) 5 MG immediate release tablet Take 5 mg by mouth daily as needed  for severe pain (pain).   Yes Historical Provider, MD  pioglitazone (ACTOS) 15 MG tablet Take 15 mg by mouth daily.  03/10/12  Yes Historical Provider, MD  potassium chloride SA (K-DUR,KLOR-CON) 20 MEQ tablet Take 1 tablet (20 mEq total) by mouth daily. Patient taking differently: Take 20 mEq by mouth 2 (two) times daily.  12/30/12  Yes Malvin Johns, MD  Probiotic Product (PROBIOTIC DAILY PO) Take 1 capsule by mouth daily.    Yes Historical Provider, MD  terazosin (HYTRIN) 5 MG capsule Take 5 mg by mouth at bedtime.  11/15/12  Yes Historical Provider, MD  torsemide (DEMADEX) 20 MG tablet Take 20 mg by mouth daily.  06/05/13  Yes Historical Provider, MD  diazepam (VALIUM) 5 MG tablet Take 1 tablet (5 mg total) by mouth every 8 (eight) hours as needed for anxiety. Patient not taking: Reported on 01/04/2015 11/28/14   Truman Hayward, MD  docusate sodium 100 MG CAPS Take 100 mg by mouth 2 (two) times daily. Patient not taking: Reported on 01/04/2015 09/08/12   Allie Bossier, MD  ONE TOUCH ULTRA TEST test strip  11/07/14   Historical Provider, MD  tiZANidine (ZANAFLEX) 2 MG tablet Take 1 tablet (2 mg total) by mouth every 6 (six) hours as needed for muscle spasms. Patient not taking: Reported on 11/28/2014 08/13/14   Leighton Parody, PA-C    Allergies  Allergen Reactions  . Ambien [Zolpidem] Other (See Comments)    hallucinations    Physical Exam  Vitals  Blood pressure 109/62, pulse 73, temperature 98 F (36.7 C), temperature source Oral, resp. rate 22, height 5' 11.5" (1.816 m), weight 240 lb (108.863 kg), SpO2 98 %.   General:  In no acute distress, appears healthy and well nourished  Psych:  Normal affect, Denies Suicidal or Homicidal ideations, Awake Alert, Oriented X 3. Speech and thought patterns are clear and appropriate, no apparent short term memory deficits  Neuro:   No focal neurological deficits, CN II through XII intact, Strength 5/5 all 4 extremities, Sensation intact all 4  extremities.  ENT:  Ears and Eyes appear Normal, Conjunctivae clear, PER. Moist oral mucosa without erythema or exudates.  Neck:  Supple, No  lymphadenopathy appreciated  Respiratory:  Symmetrical chest wall movement, Good air movement bilaterally, CTAB. Room Air  Cardiac: Irregular, No Murmurs, chronic bilateral LE edema left greater than right, no JVD, No carotid bruits, peripheral pulses palpable at 2+  Abdomen:  Positive bowel sounds, Soft, Non tender, Non distended,  No masses appreciated, no obvious hepatosplenomegaly  Skin:  No Cyanosis, Normal Skin Turgor, No Skin Rash or Bruise.  Extremities: Symmetrical without obvious trauma or injury,  no effusions.  Data Review  CBC  Recent Labs Lab 01/04/15 1309  WBC 5.3  HGB 11.9*  HCT 37.8*  PLT 122*  MCV 95.2  MCH 30.0  MCHC 31.5  RDW 14.7    Chemistries   Recent Labs Lab 01/04/15 1309  NA 141  K 4.6  CL 109  CO2 24  GLUCOSE 126*  BUN 17  CREATININE 1.28*  CALCIUM 9.4    estimated creatinine clearance is 56.3 mL/min (by C-G formula based on Cr of 1.28).  No results for input(s): TSH, T4TOTAL, T3FREE, THYROIDAB in the last 72 hours.  Invalid input(s): FREET3  Coagulation profile No results for input(s): INR, PROTIME in the last 168 hours.  No results for input(s): DDIMER in the last 72 hours.  Cardiac Enzymes No results for input(s): CKMB, TROPONINI, MYOGLOBIN in the last 168 hours.  Invalid input(s): CK  Invalid input(s): POCBNP  Urinalysis    Component Value Date/Time   COLORURINE YELLOW 12/30/2012 2015   APPEARANCEUR CLEAR 12/30/2012 2015   LABSPEC 1.011 12/30/2012 2015   PHURINE 6.0 12/30/2012 2015   GLUCOSEU NEGATIVE 12/30/2012 2015   HGBUR NEGATIVE 12/30/2012 2015   BILIRUBINUR NEGATIVE 12/30/2012 2015   KETONESUR NEGATIVE 12/30/2012 2015   PROTEINUR NEGATIVE 12/30/2012 2015   UROBILINOGEN 0.2 12/30/2012 2015   NITRITE NEGATIVE 12/30/2012 2015   LEUKOCYTESUR TRACE* 12/30/2012 2015     Imaging results:   Mr Thoracic Spine W Wo Contrast  01/10/2015  ADDENDUM REPORT: 01/10/2015 14:36 ADDENDUM: Study discussed by telephone with Dr. Rhina Brackett DAM on 01/10/2015 at 1416 hours. Electronically Signed   By: Genevie Ann M.D.   On: 01/10/2015 14:36  01/10/2015  CLINICAL DATA:  81 year old male with MSSA bacteremia in 2014 resulting in large right paraspinal abscess, right hip osteomyelitis and septic hip arthroplasty. Those required right hip surgery and removal of pacemaker. Recurrent MSSA infection, status post 6 weeks of IV antibiotics but new thoracic spine and rib pain. Subsequent encounter. Exam under anesthesia. EXAM: MRI THORACIC AND LUMBAR SPINE WITHOUT AND WITH CONTRAST TECHNIQUE: Multiplanar and multiecho pulse sequences of the thoracic and lumbar spine were obtained without and with intravenous contrast. CONTRAST:  41m MULTIHANCE GADOBENATE DIMEGLUMINE 529 MG/ML IV SOLN COMPARISON:  Lumbar spine CT 08/27/2012. Thoracic spine CT 02/13/2012. FINDINGS: MR THORACIC SPINE FINDINGS Limited sagittal imaging of the cervical spine is remarkable for chronic disc and endplate degeneration, but no cervical spine marrow signal changes. Bulky cervical endplate osteophytes, probably resulting in 1 0 more levels of cervical ankylosis. Similar bulky thoracic endplate osteophytes with suspected multilevel thoracic ankylosis. Abnormal fluid signal in the T8-T9 disc and tracking ventral through what appears to be a fracture of the anterior inferior T9 vertebral body corner series 400, image 11). Surrounding marrow edema and abnormal vertebral body enhancement there is trace disc fluid signal with abnormal enhancement also at T8-T9 (series 400, image 11). However there is no T10 superior or T8 inferior T8 endplate edema or enhancement. The T12-L1 level is described in the lumbar section below. No other  acute osseous abnormality in the thoracic spine. No thoracic spinal stenosis above T12. Thoracic spinal  cord above T12 appears normal. No abnormal thoracic intradural enhancement. No associated paraspinal abscess. Abnormal signal in the lungs dependently. Given the associated anesthesia for this study favor this represents atelectasis with trace pleural fluid. Dolichoectatic descending thoracic aorta with atherosclerosis, but otherwise appears negative. Trace fluid in the trachea related to intubation for exam under anesthesia. MR LUMBAR SPINE FINDINGS Bulky lumbar endplate osteophytosis with multilevel associated lumbar ankylosis similar to that in the cervical and thoracic spine. Transitional lumbosacral anatomy with a partially lumbarized S1 level (see series 1600, image 10). At the T12-L1 level there is abnormal fluid throughout the disc space, more so anteriorly. There is mild endplate marrow edema and enhancement. Evidence of previous surgery posteriorly at this level. Degenerative multifactorial spinal stenosis with mild spinal cord mass effect (series 1600, image 11). No definite abnormal cord signal here which is just above the conus which terminates at L1-L2. There is small volume abnormal fluid signal and enhancement in the anterior and left lateral disc space at L4-L5 (series 1700, image 11). No associated endplate enhancement. Small enhancing degenerative Schmorl node at the L5 inferior endplate incidentally noted. No other acute osseous lesion or suspicious marrow changes. Visible sacrum intact. Partially visible bladder distension.  Cholelithiasis. IMPRESSION: 1. Widespread spinal ankylosis, favor related to diffuse idiopathic skeletal hyperostosis. Chronic postoperative changes to the posterior elements of the thoracolumbar junction and throughout the lower lumbar spine. 2. Acute discitis osteomyelitis at T9-T10, T12-L1. Early discitis at T8-T9 and L4-L5. No epidural or paraspinal abscess. 3. Superimposed degenerative spinal stenosis at T12-L1 with spinal cord mass effect but no definite cord signal  abnormality. 4. Abnormal lung bases favor due to atelectasis and trace pleural effusions. Electronically Signed: By: Genevie Ann M.D. On: 01/10/2015 14:07   Mr Lumbar Spine W Wo Contrast  01/10/2015  ADDENDUM REPORT: 01/10/2015 14:36 ADDENDUM: Study discussed by telephone with Dr. Rhina Brackett DAM on 01/10/2015 at 1416 hours. Electronically Signed   By: Genevie Ann M.D.   On: 01/10/2015 14:36  01/10/2015  CLINICAL DATA:  81 year old male with MSSA bacteremia in 2014 resulting in large right paraspinal abscess, right hip osteomyelitis and septic hip arthroplasty. Those required right hip surgery and removal of pacemaker. Recurrent MSSA infection, status post 6 weeks of IV antibiotics but new thoracic spine and rib pain. Subsequent encounter. Exam under anesthesia. EXAM: MRI THORACIC AND LUMBAR SPINE WITHOUT AND WITH CONTRAST TECHNIQUE: Multiplanar and multiecho pulse sequences of the thoracic and lumbar spine were obtained without and with intravenous contrast. CONTRAST:  12m MULTIHANCE GADOBENATE DIMEGLUMINE 529 MG/ML IV SOLN COMPARISON:  Lumbar spine CT 08/27/2012. Thoracic spine CT 02/13/2012. FINDINGS: MR THORACIC SPINE FINDINGS Limited sagittal imaging of the cervical spine is remarkable for chronic disc and endplate degeneration, but no cervical spine marrow signal changes. Bulky cervical endplate osteophytes, probably resulting in 1 0 more levels of cervical ankylosis. Similar bulky thoracic endplate osteophytes with suspected multilevel thoracic ankylosis. Abnormal fluid signal in the T8-T9 disc and tracking ventral through what appears to be a fracture of the anterior inferior T9 vertebral body corner series 400, image 11). Surrounding marrow edema and abnormal vertebral body enhancement there is trace disc fluid signal with abnormal enhancement also at T8-T9 (series 400, image 11). However there is no T10 superior or T8 inferior T8 endplate edema or enhancement. The T12-L1 level is described in the lumbar  section below. No other acute osseous abnormality in  the thoracic spine. No thoracic spinal stenosis above T12. Thoracic spinal cord above T12 appears normal. No abnormal thoracic intradural enhancement. No associated paraspinal abscess. Abnormal signal in the lungs dependently. Given the associated anesthesia for this study favor this represents atelectasis with trace pleural fluid. Dolichoectatic descending thoracic aorta with atherosclerosis, but otherwise appears negative. Trace fluid in the trachea related to intubation for exam under anesthesia. MR LUMBAR SPINE FINDINGS Bulky lumbar endplate osteophytosis with multilevel associated lumbar ankylosis similar to that in the cervical and thoracic spine. Transitional lumbosacral anatomy with a partially lumbarized S1 level (see series 1600, image 10). At the T12-L1 level there is abnormal fluid throughout the disc space, more so anteriorly. There is mild endplate marrow edema and enhancement. Evidence of previous surgery posteriorly at this level. Degenerative multifactorial spinal stenosis with mild spinal cord mass effect (series 1600, image 11). No definite abnormal cord signal here which is just above the conus which terminates at L1-L2. There is small volume abnormal fluid signal and enhancement in the anterior and left lateral disc space at L4-L5 (series 1700, image 11). No associated endplate enhancement. Small enhancing degenerative Schmorl node at the L5 inferior endplate incidentally noted. No other acute osseous lesion or suspicious marrow changes. Visible sacrum intact. Partially visible bladder distension.  Cholelithiasis. IMPRESSION: 1. Widespread spinal ankylosis, favor related to diffuse idiopathic skeletal hyperostosis. Chronic postoperative changes to the posterior elements of the thoracolumbar junction and throughout the lower lumbar spine. 2. Acute discitis osteomyelitis at T9-T10, T12-L1. Early discitis at T8-T9 and L4-L5. No epidural or  paraspinal abscess. 3. Superimposed degenerative spinal stenosis at T12-L1 with spinal cord mass effect but no definite cord signal abnormality. 4. Abnormal lung bases favor due to atelectasis and trace pleural effusions. Electronically Signed: By: Genevie Ann M.D. On: 01/10/2015 14:07     EKG: (Independently reviewed) preprocedure EKG from 12/9 with underlying atrial fibrillation with controlled ventricular rate 71 bpm, underlying left axis deviation and wide QRS, QTC 460 ms, no ischemic changes.    Assessment & Plan  Principal Problem:   Discitis of thoracolumbar region/ Osteomyelitis of thoracic spine -Medical floor -ID following and will manage antibiotics-currently they have requested discontinue Keflex for now -IR has documented that they will review the MRI and patient findings with the radiologist on 12/16 and in the interim the patient will remain nothing by mouth after midnight and labs will be repeated -Continue preadmission medications for pain -PT/OT evaluation -Chek ESR and C-reactive protein as well as blood cultures -Gentle IV fluid hydration post recent MRI -PICC line in anticipation of prolonged antibiotic therapies after discharge  Active Problems:   HTN  -Blood pressure somewhat suboptimal -Continue Cardizem and metoprolol -Hold Zaroxolyn and Demadex postprocedure    CKD (chronic kidney disease), stage III -MRI completed without contrast -Renal function stable at baseline    Chronic atrial fibrillation  -Rate controlled -Not anticoagulated secondary to prior retroperitoneal hematoma and setting a supratherapeutic INR (which likely may recurred in setting of chronic recurrent antibiotic use) -Continue beta blocker and calcium channel blocker as above -CHADVASc = 4    DM type 2  -Mild hyperglycemia -Hold Actos initially -SSI -Check hemoglobin A1c; was 6.2 in July 2016    Anemia of chronic disease -Hemoglobin stable and at baseline    Hypothyroidism -Continue  Synthroid  -Check TSH    HLD  -Continue Mevacor    BPH  -Continue Hytrin    DVT Prophylaxis: SCDs-holding on pharmacological DVT prophylaxis until determine whether patient  is candidate for thoracic aspiration related to discitis  Family Communication:   No family at bedside  Code Status:  Full code  Condition:  Stable  Discharge disposition: When medically stable anticipate discharge back to home environment pending PT/OT evaluation  Time spent in minutes : 60      Chistopher Mangino L. ANP on 01/10/2015 at 4:01 PM  You may contact me by going to [email or web address] - password [email or web address]  I am available from 7a-7p but please confirm I am on the schedule by going to Amion as above.   After 7p please contact night coverage person covering me after hours  [facility]

## 2015-01-10 NOTE — Anesthesia Postprocedure Evaluation (Signed)
Anesthesia Post Note  Patient: [name]  Procedure(s) Performed: Procedure(s) (LRB): MRI LUMBAR SPINE WITH/WITHOUT   (RADIOLOGY WITH ANESTHESIA) (N/A)  Patient location during evaluation: PACU Anesthesia Type: General Level of consciousness: awake and alert Pain management: pain level controlled Vital Signs Assessment: post-procedure vital signs reviewed and stable Respiratory status: spontaneous breathing, nonlabored ventilation, respiratory function stable and patient connected to nasal cannula oxygen Cardiovascular status: blood pressure returned to baseline and stable Postop Assessment: no signs of nausea or vomiting Anesthetic complications: no    Last Vitals:  Filed Vitals:   01/10/15 1345 01/10/15 1400  BP: 117/73 111/75  Pulse: 67 58  Temp:    Resp: 20 14    Last Pain: There were no vitals filed for this visit.               Shayaan Parke DAVID

## 2015-01-10 NOTE — Progress Notes (Signed)
Spoke with primary RN in PACU and informed her that IV Team is not able to  Insert PICCs due to difficult veins on this pt.   Interventional Radiology inserts her PICCs.

## 2015-01-10 NOTE — Progress Notes (Signed)
IR PA aware of request for disc aspiration. Will review with radiologist 12/16, orders placed for NPO after midnight and new labs.  Pattricia BossKoreen Mackenzy Grumbine PA-C Interventional Radiology  01/10/15  3:30 PM

## 2015-01-10 NOTE — Anesthesia Preprocedure Evaluation (Addendum)
Anesthesia Evaluation  Patient identified by MRN, date of birth, ID band Patient awake    Reviewed: Allergy & Precautions, NPO status , Patient's Chart, lab work & pertinent test results  History of Anesthesia Complications Negative for: history of anesthetic complications  Airway Mallampati: I  TM Distance: >3 FB Neck ROM: Full    Dental  (+) Upper Dentures, Dental Advisory Given, Missing,    Pulmonary neg pulmonary ROS,    Pulmonary exam normal breath sounds clear to auscultation       Cardiovascular Exercise Tolerance: Poor hypertension, Pt. on home beta blockers and Pt. on medications Normal cardiovascular exam+ dysrhythmias Atrial Fibrillation (-) pacemaker Rhythm:Irregular Rate:Normal     Neuro/Psych PSYCHIATRIC DISORDERS Anxiety Depression    GI/Hepatic negative GI ROS, Neg liver ROS,   Endo/Other  diabetes, Well Controlled, Oral Hypoglycemic AgentsHypothyroidism   Renal/GU Renal disease     Musculoskeletal  (+) Arthritis , Osteoarthritis,    Abdominal (+) + obese,  Abdomen: soft. Bowel sounds: normal.  Peds  Hematology  (+) anemia ,   Anesthesia Other Findings   Reproductive/Obstetrics                            BP Readings from Last 3 Encounters:  01/10/15 176/83  01/04/15 144/75  11/28/14 133/66   Lab Results  Component Value Date   WBC 5.3 01/04/2015   HGB 11.9* 01/04/2015   HCT 37.8* 01/04/2015   MCV 95.2 01/04/2015   PLT 122* 01/04/2015     Chemistry      Component Value Date/Time   NA 141 01/04/2015 1309   NA 143 03/21/2012 1426   K 4.6 01/04/2015 1309   K 4.0 03/21/2012 1426   CL 109 01/04/2015 1309   CL 101 03/21/2012 1426   CO2 24 01/04/2015 1309   CO2 31* 03/21/2012 1426   BUN 17 01/04/2015 1309   BUN 43.7 Repeated and Verified* 03/21/2012 1426   CREATININE 1.28* 01/04/2015 1309   CREATININE 1.43* 11/28/2014 1230   CREATININE 1.9 Repeated and  Verified* 03/21/2012 1426      Component Value Date/Time   CALCIUM 9.4 01/04/2015 1309   CALCIUM 9.3 03/21/2012 1426   ALKPHOS 95 12/30/2012 1940   ALKPHOS 74 03/21/2012 1426   AST 84* 12/30/2012 1940   AST 18 03/21/2012 1426   ALT 104* 12/30/2012 1940   ALT 15 03/21/2012 1426   BILITOT 0.6 12/30/2012 1940   BILITOT 0.42 03/21/2012 1426      EKG 01/04/15: Atrial fibrillation Low voltage QRS Possible Septal infarct , age undetermined Leftward axis No significant change since last tracing 12/31/14  Anesthesia Physical  Anesthesia Plan  ASA: III  Anesthesia Plan: General   Post-op Pain Management:    Induction: Intravenous  Airway Management Planned: LMA  Additional Equipment:   Intra-op Plan:   Post-operative Plan: Extubation in OR  Informed Consent: I have reviewed the patients History and Physical, chart, labs and discussed the procedure including the risks, benefits and alternatives for the proposed anesthesia with the patient or authorized representative who has indicated his/her understanding and acceptance.   Dental advisory given  Plan Discussed with: CRNA and Surgeon  Anesthesia Plan Comments:        Anesthesia Quick Evaluation

## 2015-01-10 NOTE — Progress Notes (Signed)
NURSING PROGRESS NOTE  [name]  MRN: [id number]  Admission Data: 01/10/2015 9:14 PM Attending Provider: Alba CoryBelkys A Regalado, MD  PCP: Malka SoJOBE,DANIEL B., MD  Code status: FULL  Allergies:  Allergies  Allergen Reactions  . Ambien [Zolpidem] Other (See Comments)    hallucinations     Past Medical History:  has a past medical history of Atrial fibrillation (HCC); Hypertension; Pacemaker; Arthritis; Anxiety; Depression; MSSA (methicillin susceptible Staphylococcus aureus) infection; Bacteremia; Infected pacemaker (HCC); Septic hip (HCC); Complication of anesthesia; Dysrhythmia; Diabetes mellitus without complication (HCC); Constipation; History of blood product transfusion; OAB (overactive bladder); CKD (chronic kidney disease); Hypothyroidism; and Thoracic spine pain (11/28/2014).   Past Surgical History:  has past surgical history that includes Pacemaker insertion; Lumbar laminectomy/decompression microdiscectomy (02/14/2012); TEE without cardioversion (N/A, 08/30/2012); Icd lead removal (Left, 08/31/2012); Incision and drainage hip (Right, 09/02/2012); Total hip revision (Right, 09/02/2012); Hip Closed Reduction (Right, 10/02/2012); Total hip revision (Right, 11/28/2012); Colonoscopy w/ polypectomy; Incision and drainage hip (Right, 08/10/2014); Tonsillectomy; Eye surgery; and Joint replacement.   [name] is a 81 y.o.  male patient, arrived to floor in room 5W27 via stretcher, transferred from PACU. Patient alert and oriented X 4. No acute distress noted. Complains of chronic pain 3/10 over mid lower back.   Vital signs: Oral temperature 98.1 F (36.7 C), Blood pressure 129/75, Pulse 76, RR 18, SpO2 98 % on room air. Height 5'11.5" (181.6 cm), weight 237 lbs (107.7 kg).   Cardiac monitoring: None  IV access: Left hand; condition patent and no redness.  Skin: intact, no pressure ulcer noted in sacral area.   Patient's ID armband verified with patient/ family, and in place. Information packet  given to patient/ family. Fall risk assessed, SR up X2, patient/ family able to verbalize understanding of risks associated with falls and to call nurse or staff to assist before getting out of bed. Patient/ family oriented to room and equipment. Call bell within reach.

## 2015-01-11 ENCOUNTER — Other Ambulatory Visit: Payer: Medicare Other

## 2015-01-11 ENCOUNTER — Inpatient Hospital Stay (HOSPITAL_COMMUNITY): Payer: Medicare Other

## 2015-01-11 ENCOUNTER — Encounter (HOSPITAL_COMMUNITY): Payer: Self-pay | Admitting: Radiology

## 2015-01-11 ENCOUNTER — Inpatient Hospital Stay: Admission: RE | Admit: 2015-01-11 | Payer: Medicare Other | Source: Ambulatory Visit

## 2015-01-11 DIAGNOSIS — I120 Hypertensive chronic kidney disease with stage 5 chronic kidney disease or end stage renal disease: Secondary | ICD-10-CM

## 2015-01-11 DIAGNOSIS — E118 Type 2 diabetes mellitus with unspecified complications: Secondary | ICD-10-CM

## 2015-01-11 DIAGNOSIS — M4625 Osteomyelitis of vertebra, thoracolumbar region: Secondary | ICD-10-CM

## 2015-01-11 DIAGNOSIS — I482 Chronic atrial fibrillation: Secondary | ICD-10-CM

## 2015-01-11 DIAGNOSIS — Z959 Presence of cardiac and vascular implant and graft, unspecified: Secondary | ICD-10-CM

## 2015-01-11 DIAGNOSIS — N183 Chronic kidney disease, stage 3 (moderate): Secondary | ICD-10-CM

## 2015-01-11 LAB — HEMOGLOBIN A1C
HEMOGLOBIN A1C: 6 % — AB (ref 4.8–5.6)
MEAN PLASMA GLUCOSE: 126 mg/dL

## 2015-01-11 LAB — PROTIME-INR
INR: 1.19 (ref 0.00–1.49)
PROTHROMBIN TIME: 15.3 s — AB (ref 11.6–15.2)

## 2015-01-11 LAB — COMPREHENSIVE METABOLIC PANEL
ALBUMIN: 2.8 g/dL — AB (ref 3.5–5.0)
ALT: 28 U/L (ref 17–63)
ANION GAP: 5 (ref 5–15)
AST: 36 U/L (ref 15–41)
Alkaline Phosphatase: 120 U/L (ref 38–126)
BUN: 11 mg/dL (ref 6–20)
CHLORIDE: 109 mmol/L (ref 101–111)
CO2: 25 mmol/L (ref 22–32)
Calcium: 8.7 mg/dL — ABNORMAL LOW (ref 8.9–10.3)
Creatinine, Ser: 1.27 mg/dL — ABNORMAL HIGH (ref 0.61–1.24)
GFR calc Af Amer: 59 mL/min — ABNORMAL LOW (ref >60)
GFR calc non Af Amer: 51 mL/min — ABNORMAL LOW (ref >60)
GLUCOSE: 88 mg/dL (ref 65–99)
POTASSIUM: 4.2 mmol/L (ref 3.5–5.1)
SODIUM: 139 mmol/L (ref 135–145)
Total Bilirubin: 1.2 mg/dL (ref 0.3–1.2)
Total Protein: 5.7 g/dL — ABNORMAL LOW (ref 6.5–8.1)

## 2015-01-11 LAB — CBC
HEMATOCRIT: 31.2 % — AB (ref 39.0–52.0)
HEMOGLOBIN: 10.2 g/dL — AB (ref 13.0–17.0)
MCH: 31 pg (ref 26.0–34.0)
MCHC: 32.7 g/dL (ref 30.0–36.0)
MCV: 94.8 fL (ref 78.0–100.0)
Platelets: 121 10*3/uL — ABNORMAL LOW (ref 150–400)
RBC: 3.29 MIL/uL — ABNORMAL LOW (ref 4.22–5.81)
RDW: 15.3 % (ref 11.5–15.5)
WBC: 5.9 10*3/uL (ref 4.0–10.5)

## 2015-01-11 LAB — GLUCOSE, CAPILLARY
GLUCOSE-CAPILLARY: 130 mg/dL — AB (ref 65–99)
GLUCOSE-CAPILLARY: 84 mg/dL (ref 65–99)
Glucose-Capillary: 101 mg/dL — ABNORMAL HIGH (ref 65–99)
Glucose-Capillary: 84 mg/dL (ref 65–99)
Glucose-Capillary: 76 mg/dL (ref 65–99)

## 2015-01-11 MED ORDER — MIDAZOLAM HCL 2 MG/2ML IJ SOLN
INTRAMUSCULAR | Status: AC | PRN
  Administered 2015-01-11 (×2): 1 mg via INTRAVENOUS

## 2015-01-11 MED ORDER — FENTANYL CITRATE (PF) 100 MCG/2ML IJ SOLN
50.0000 ug | Freq: Once | INTRAMUSCULAR | Status: AC
  Administered 2015-01-11: 50 ug via INTRAVENOUS

## 2015-01-11 MED ORDER — CEFAZOLIN SODIUM-DEXTROSE 2-3 GM-% IV SOLR
2.0000 g | Freq: Three times a day (TID) | INTRAVENOUS | Status: DC
  Administered 2015-01-11 – 2015-01-14 (×9): 2 g via INTRAVENOUS
  Filled 2015-01-11 (×12): qty 50

## 2015-01-11 MED ORDER — HYDROMORPHONE HCL 1 MG/ML IJ SOLN
0.2500 mg | INTRAMUSCULAR | Status: DC | PRN
  Administered 2015-01-12 (×2): 0.5 mg via INTRAVENOUS
  Filled 2015-01-11 (×2): qty 1

## 2015-01-11 MED ORDER — FENTANYL CITRATE (PF) 100 MCG/2ML IJ SOLN
INTRAMUSCULAR | Status: AC | PRN
  Administered 2015-01-11 (×2): 50 ug via INTRAVENOUS

## 2015-01-11 MED ORDER — ONDANSETRON HCL 4 MG/2ML IJ SOLN: 4.0000 mg | Freq: Once | INTRAMUSCULAR | Status: DC | PRN

## 2015-01-11 MED ORDER — FENTANYL CITRATE (PF) 100 MCG/2ML IJ SOLN
INTRAMUSCULAR | Status: AC
  Administered 2015-01-11: 50 ug via INTRAVENOUS
  Filled 2015-01-11: qty 6

## 2015-01-11 MED ORDER — LIDOCAINE HCL 1 % IJ SOLN
INTRAMUSCULAR | Status: AC
  Filled 2015-01-11: qty 20

## 2015-01-11 MED ORDER — MEPERIDINE HCL 25 MG/ML IJ SOLN: 6.2500 mg | INTRAMUSCULAR | Status: DC | PRN

## 2015-01-11 MED ORDER — SODIUM CHLORIDE 0.9 % IV SOLN
INTRAVENOUS | Status: AC | PRN
  Administered 2015-01-11: 10 mL/h via INTRAVENOUS

## 2015-01-11 MED ORDER — MIDAZOLAM HCL 2 MG/2ML IJ SOLN
INTRAMUSCULAR | Status: AC
  Filled 2015-01-11: qty 6

## 2015-01-11 NOTE — Progress Notes (Signed)
[facility] PROGRESS NOTE  [name] ZOX:[id number] DOB: 09/02/1930 DOA: 01/10/2015  PCP: Malka So., MD  Brief HPI: 81 year old Caucasian male with a past medical history of atrial fibrillation, not on anticoagulation, hypertension, diabetes, chronic kidney disease with a complex orthopedic history involving his right hip with multiple infections. Presented after MRI of his spine revealed discitis at multiple levels. He was hospitalized for further management.  Past medical history:  Past Medical History  Diagnosis Date  . Atrial fibrillation (HCC)   . Hypertension   . Pacemaker   . Arthritis   . Anxiety   . Depression   . MSSA (methicillin susceptible Staphylococcus aureus) infection   . Bacteremia   . Infected pacemaker (HCC)   . Septic hip (HCC)   . Complication of anesthesia     " Terrible Nightmares"  . Dysrhythmia     Afib- had pacemaker, had it removed- takes Aspirin 325- daily  . Diabetes mellitus without complication (HCC)     Type II  . Constipation   . History of blood product transfusion   . OAB (overactive bladder)   . CKD (chronic kidney disease)   . Hypothyroidism   . Thoracic spine pain 11/28/2014    Consultants: Infectious disease. Interventional radiology  Procedures: Plan is for disc aspiration  Antibiotics: Currently on no antibiotics  Subjective: Patient denies any pain at this time. He is puzzled as to why he keeps developing these infections.  Objective: Vital Signs  Filed Vitals:   01/10/15 2052 01/11/15 0453 01/11/15 1045 01/11/15 1233  BP: 129/75 135/58 142/77 150/86  Pulse: 76 77  90  Temp: 98.1 F (36.7 C) 98.3 F (36.8 C)    TempSrc: Oral Oral    Resp: Height: 5' 11.5" (1.816 m)     Weight: 107.7 kg (237 lb 7 oz) 109.6 kg (241 lb 10 oz)    SpO2: 98% 98%  100%    Intake/Output Summary (Last 24 hours) at 01/11/15 1303 Last data filed at 01/11/15 0930  Gross per 24 hour  Intake    120 ml  Output    1195 ml  Net  -1075 ml   Filed Weights   01/10/15 0819 01/10/15 2052 01/11/15 0453  Weight: 108.863 kg (240 lb) 107.7 kg (237 lb 7 oz) 109.6 kg (241 lb 10 oz)    General appearance: alert, cooperative, appears stated age and no distress Resp: clear to auscultation bilaterally Cardio: regular rate and rhythm, S1, S2 normal, no murmur, click, rub or gallop GI: soft, non-tender; bowel sounds normal; no masses,  no organomegaly Extremities: extremities normal, atraumatic, no cyanosis or edema Neurologic: Alert and oriented 3. No focal neurological deficits.  Lab Results:  Basic Metabolic Panel:  Recent Labs Lab 01/04/15 1309 01/10/15 2000 01/11/15 0540  NA 141 140 139  K 4.6 4.2 4.2  CL 109 109 109  CO2 GLUCOSE 126* 93 88  BUN CREATININE 1.28* 1.27* 1.27*  CALCIUM 9.4 8.9 8.7*   Liver Function Tests:  Recent Labs Lab 01/11/15 0540  AST 36  ALT 28  ALKPHOS 120  BILITOT 1.2  PROT 5.7*  ALBUMIN 2.8*   CBC:  Recent Labs Lab 01/04/15 1309 01/10/15 0640 01/11/15 0540  WBC 5.3 6.4 5.9  HGB 11.9* 10.2* 10.2*  HCT 37.8* 31.4* 31.2*  MCV 95.2 94.0 94.8  PLT 122* 125* 121*   CBG:  Recent Labs Lab 01/10/15 0821 01/10/15 1343 01/10/15  1936 01/10/15 2058 01/11/15 0807  GLUCAP 93 101* 79 101* 84    Recent Results (from the past 240 hour(s))  Culture, blood (Routine X 2) w Reflex to ID Panel     Status: None (Preliminary result)   Collection Time: 01/10/15  4:15 PM  Result Value Ref Range Status   Specimen Description BLOOD RIGHT HAND  Final   Special Requests IN PEDIATRIC BOTTLE 4CC  Final   Culture NO GROWTH < 24 HOURS  Final   Report Status PENDING  Incomplete  Culture, blood (Routine X 2) w Reflex to ID Panel     Status: None (Preliminary result)   Collection Time: 01/10/15  4:30 PM  Result Value Ref Range Status   Specimen Description BLOOD RIGHT HAND  Final   Special Requests IN PEDIATRIC BOTTLE 2CC  Final   Culture NO GROWTH  < 24 HOURS  Final   Report Status PENDING  Incomplete      Studies/Results: Mr Thoracic Spine W Wo Contrast  01/10/2015  ADDENDUM REPORT: 01/10/2015 14:36 ADDENDUM: Study discussed by telephone with Dr. Paulette BlanchORNELIUS VAN DAM on 01/10/2015 at 1416 hours. Electronically Signed   By: Odessa FlemingH  Hall M.D.   On: 01/10/2015 14:36  01/10/2015  CLINICAL DATA:  81 year old male with MSSA bacteremia in 2014 resulting in large right paraspinal abscess, right hip osteomyelitis and septic hip arthroplasty. Those required right hip surgery and removal of pacemaker. Recurrent MSSA infection, status post 6 weeks of IV antibiotics but new thoracic spine and rib pain. Subsequent encounter. Exam under anesthesia. EXAM: MRI THORACIC AND LUMBAR SPINE WITHOUT AND WITH CONTRAST TECHNIQUE: Multiplanar and multiecho pulse sequences of the thoracic and lumbar spine were obtained without and with intravenous contrast. CONTRAST:  20mL MULTIHANCE GADOBENATE DIMEGLUMINE 529 MG/ML IV SOLN COMPARISON:  Lumbar spine CT 08/27/2012. Thoracic spine CT 02/13/2012. FINDINGS: MR THORACIC SPINE FINDINGS Limited sagittal imaging of the cervical spine is remarkable for chronic disc and endplate degeneration, but no cervical spine marrow signal changes. Bulky cervical endplate osteophytes, probably resulting in 1 0 more levels of cervical ankylosis. Similar bulky thoracic endplate osteophytes with suspected multilevel thoracic ankylosis. Abnormal fluid signal in the T8-T9 disc and tracking ventral through what appears to be a fracture of the anterior inferior T9 vertebral body corner series 400, image 11). Surrounding marrow edema and abnormal vertebral body enhancement there is trace disc fluid signal with abnormal enhancement also at T8-T9 (series 400, image 11). However there is no T10 superior or T8 inferior T8 endplate edema or enhancement. The T12-L1 level is described in the lumbar section below. No other acute osseous abnormality in the thoracic spine.  No thoracic spinal stenosis above T12. Thoracic spinal cord above T12 appears normal. No abnormal thoracic intradural enhancement. No associated paraspinal abscess. Abnormal signal in the lungs dependently. Given the associated anesthesia for this study favor this represents atelectasis with trace pleural fluid. Dolichoectatic descending thoracic aorta with atherosclerosis, but otherwise appears negative. Trace fluid in the trachea related to intubation for exam under anesthesia. MR LUMBAR SPINE FINDINGS Bulky lumbar endplate osteophytosis with multilevel associated lumbar ankylosis similar to that in the cervical and thoracic spine. Transitional lumbosacral anatomy with a partially lumbarized S1 level (see series 1600, image 10). At the T12-L1 level there is abnormal fluid throughout the disc space, more so anteriorly. There is mild endplate marrow edema and enhancement. Evidence of previous surgery posteriorly at this level. Degenerative multifactorial spinal stenosis with mild spinal cord mass effect (series 1600, image 11). No  definite abnormal cord signal here which is just above the conus which terminates at L1-L2. There is small volume abnormal fluid signal and enhancement in the anterior and left lateral disc space at L4-L5 (series 1700, image 11). No associated endplate enhancement. Small enhancing degenerative Schmorl node at the L5 inferior endplate incidentally noted. No other acute osseous lesion or suspicious marrow changes. Visible sacrum intact. Partially visible bladder distension.  Cholelithiasis. IMPRESSION: 1. Widespread spinal ankylosis, favor related to diffuse idiopathic skeletal hyperostosis. Chronic postoperative changes to the posterior elements of the thoracolumbar junction and throughout the lower lumbar spine. 2. Acute discitis osteomyelitis at T9-T10, T12-L1. Early discitis at T8-T9 and L4-L5. No epidural or paraspinal abscess. 3. Superimposed degenerative spinal stenosis at T12-L1 with  spinal cord mass effect but no definite cord signal abnormality. 4. Abnormal lung bases favor due to atelectasis and trace pleural effusions. Electronically Signed: By: Odessa Fleming M.D. On: 01/10/2015 14:07   Mr Lumbar Spine W Wo Contrast  01/10/2015  ADDENDUM REPORT: 01/10/2015 14:36 ADDENDUM: Study discussed by telephone with Dr. Paulette Blanch DAM on 01/10/2015 at 1416 hours. Electronically Signed   By: Odessa Fleming M.D.   On: 01/10/2015 14:36  01/10/2015  CLINICAL DATA:  81 year old male with MSSA bacteremia in 2014 resulting in large right paraspinal abscess, right hip osteomyelitis and septic hip arthroplasty. Those required right hip surgery and removal of pacemaker. Recurrent MSSA infection, status post 6 weeks of IV antibiotics but new thoracic spine and rib pain. Subsequent encounter. Exam under anesthesia. EXAM: MRI THORACIC AND LUMBAR SPINE WITHOUT AND WITH CONTRAST TECHNIQUE: Multiplanar and multiecho pulse sequences of the thoracic and lumbar spine were obtained without and with intravenous contrast. CONTRAST:  20mL MULTIHANCE GADOBENATE DIMEGLUMINE 529 MG/ML IV SOLN COMPARISON:  Lumbar spine CT 08/27/2012. Thoracic spine CT 02/13/2012. FINDINGS: MR THORACIC SPINE FINDINGS Limited sagittal imaging of the cervical spine is remarkable for chronic disc and endplate degeneration, but no cervical spine marrow signal changes. Bulky cervical endplate osteophytes, probably resulting in 1 0 more levels of cervical ankylosis. Similar bulky thoracic endplate osteophytes with suspected multilevel thoracic ankylosis. Abnormal fluid signal in the T8-T9 disc and tracking ventral through what appears to be a fracture of the anterior inferior T9 vertebral body corner series 400, image 11). Surrounding marrow edema and abnormal vertebral body enhancement there is trace disc fluid signal with abnormal enhancement also at T8-T9 (series 400, image 11). However there is no T10 superior or T8 inferior T8 endplate edema or  enhancement. The T12-L1 level is described in the lumbar section below. No other acute osseous abnormality in the thoracic spine. No thoracic spinal stenosis above T12. Thoracic spinal cord above T12 appears normal. No abnormal thoracic intradural enhancement. No associated paraspinal abscess. Abnormal signal in the lungs dependently. Given the associated anesthesia for this study favor this represents atelectasis with trace pleural fluid. Dolichoectatic descending thoracic aorta with atherosclerosis, but otherwise appears negative. Trace fluid in the trachea related to intubation for exam under anesthesia. MR LUMBAR SPINE FINDINGS Bulky lumbar endplate osteophytosis with multilevel associated lumbar ankylosis similar to that in the cervical and thoracic spine. Transitional lumbosacral anatomy with a partially lumbarized S1 level (see series 1600, image 10). At the T12-L1 level there is abnormal fluid throughout the disc space, more so anteriorly. There is mild endplate marrow edema and enhancement. Evidence of previous surgery posteriorly at this level. Degenerative multifactorial spinal stenosis with mild spinal cord mass effect (series 1600, image 11). No definite abnormal cord signal here  which is just above the conus which terminates at L1-L2. There is small volume abnormal fluid signal and enhancement in the anterior and left lateral disc space at L4-L5 (series 1700, image 11). No associated endplate enhancement. Small enhancing degenerative Schmorl node at the L5 inferior endplate incidentally noted. No other acute osseous lesion or suspicious marrow changes. Visible sacrum intact. Partially visible bladder distension.  Cholelithiasis. IMPRESSION: 1. Widespread spinal ankylosis, favor related to diffuse idiopathic skeletal hyperostosis. Chronic postoperative changes to the posterior elements of the thoracolumbar junction and throughout the lower lumbar spine. 2. Acute discitis osteomyelitis at T9-T10, T12-L1.  Early discitis at T8-T9 and L4-L5. No epidural or paraspinal abscess. 3. Superimposed degenerative spinal stenosis at T12-L1 with spinal cord mass effect but no definite cord signal abnormality. 4. Abnormal lung bases favor due to atelectasis and trace pleural effusions. Electronically Signed: By: Odessa Fleming M.D. On: 01/10/2015 14:07    Medications:  Scheduled: . diltiazem  120 mg Oral Daily  . fentaNYL      . ferrous sulfate  325 mg Oral Q breakfast  . finasteride  5 mg Oral QHS  . insulin aspart  0-15 Units Subcutaneous TID WC  . insulin aspart  0-5 Units Subcutaneous QHS  . lactulose  20 g Oral Daily  . levothyroxine  88 mcg Oral QAC breakfast  . lidocaine      . metoprolol succinate  12.5 mg Oral Daily  . midazolam      . pravastatin  10 mg Oral q1800  . terazosin  5 mg Oral QHS   Continuous: . sodium chloride 50 mL/hr at 01/10/15 2303   ZOX:[id number] **OR** acetaminophen, ondansetron **OR** ondansetron (ZOFRAN) IV  Assessment/Plan:  Principal Problem:   Discitis of thoracolumbar region Active Problems:   HTN (hypertension)   Anemia of chronic disease   CKD (chronic kidney disease), stage III   Chronic atrial fibrillation (HCC)   Hypothyroidism   HLD (hyperlipidemia)   BPH (benign prostatic hyperplasia)   DM type 2 (diabetes mellitus, type 2) (HCC)   Osteomyelitis of thoracic spine (HCC)    Discitis of thoracolumbar region/ Osteomyelitis of thoracic spine Patient currently being kept off of IV antibiotics. Infectious disease is following. Patient to be seen by interventional radiology for disc aspiration. Blood cultures have been sent. Infectious disease recommended PICC line.  Essential HTN  Blood pressure somewhat suboptimal. Continue Cardizem and metoprolol. Continue to monitor closely.  CKD (chronic kidney disease), stage III Renal function stable at baseline  Chronic atrial fibrillation  Rate controlled. Not anticoagulated secondary to prior  retroperitoneal hematoma and setting a supratherapeutic INR (which likely may recurred in setting of chronic recurrent antibiotic use). Continue beta blocker and calcium channel blocker as above. CHADVASc = 4  DM type 2  Continue sliding scale coverage. HbA1c was 6.2 in July.   Anemia of chronic disease Hemoglobin stable and at baseline  Hypothyroidism Continue Synthroid   HLD  Continue Mevacor  BPH  Continue Hytrin  DVT Prophylaxis: SCDs    Code Status: Full code  Family Communication: Discussed in detail with the patient  Disposition Plan: Await disc aspiration. Await ID input.     LOS: 1 day   [facility]  [facility] Pager [phone] 01/11/2015, 1:03 PM  If 7PM-7AM, please contact night-coverage at [email or web address], password [facility]

## 2015-01-11 NOTE — Sedation Documentation (Signed)
Patient denies pain and is resting comfortably.  

## 2015-01-11 NOTE — Sedation Documentation (Signed)
Patient is resting comfortably. 

## 2015-01-11 NOTE — Consult Note (Signed)
[facility]  Date: 01/11/2015               Patient Name:  [name] MRN: [id number]  DOB: [birth year] Age / Sex: 81 y.o., male   PCP: Lilian Coma, MD         Requesting Physician: Dr. Bonnielee Haff, MD    Consulting Reason:  Discitis osteomyelitis T9-10, T12-L1      History of Present Illness: [name] is a 81 year old gentleman with PMH of chronic Afib (not on anticoagulation 2/2 hx of retroperitoneal hematoma), HTN, DM, Pacemaker (now removed 2/2 to infected leads), and Right prosthetic hip. He had a laminectomy in 01/2012.   In 08/2012 he was admitted for MSSA bacteremia and large retroperitoneal abscess and had radical irrigation and debridement right total hip with removal of all synovium, revision of femoral head, revision of polyethylene liner placement of large bore Hemovac drains. His ICD was also removed. He had 8 weeks of IV Cefazolin and oral Rifampin. Cefazolin was switched to oral Keflex afterwards.   In September 2014 he had dislocation of his right total hip replacement and underwent closed reduction. In October 2014 he was readmitted and on 11/28/2012 he had irrigation and debridement of infected right total hip with removal of all synovium, revision of femoral head to a +5 40 mm metal head. He was continued on oral Keflex 500 mg po TID and discontinued Rifampin.  He was doing well until July 2016 when he had to undergo radical irrigation and debridement of infected hybrid right total hip with removal of all synovium, removal of all components including cement going down the femur and the distal cement restrictor Hemovac drains. He was placed on IV Ancef and then transitioned to oral Keflex, now at 1 gram po BID.  In November 2016, patient had new back pain without neurological deficits.  His keflex was stopped on 12-14.  He was unable to tolerate a standard MRI. Yesterday (01/10/2015) he had MRI completed under anesthesia  which revealed acute discitis/osteomyelitis of the thoracolumbar spine without evidence of paraspinal abscess. Keflex is being held now for disc aspiration for specimen/culture collection by IR.    Meds: Current Facility-Administered Medications  Medication Dose Route Frequency Provider Last Rate Last Dose  . 0.9 %  sodium chloride infusion   Intravenous Continuous Samella Parr, NP 50 mL/hr at 01/11/15 1510 1,000 mL at 01/11/15 1510  . acetaminophen (TYLENOL) tablet 650 mg  650 mg Oral Q6H PRN Samella Parr, NP       Or  . acetaminophen (TYLENOL) suppository 650 mg  650 mg Rectal Q6H PRN Samella Parr, NP      . ceFAZolin (ANCEF) IVPB 2 g/50 mL premix  2 g Intravenous Q8H Zada Finders, MD      . diltiazem (CARDIZEM CD) 24 hr capsule 120 mg  120 mg Oral Daily Belkys A Regalado, MD   120 mg at 01/11/15 1046  . ferrous sulfate tablet 325 mg  325 mg Oral Q breakfast Samella Parr, NP   325 mg at 01/11/15 1046  . finasteride (PROSCAR) tablet 5 mg  5 mg Oral QHS Samella Parr, NP   5 mg at 01/10/15 2300  . HYDROmorphone (DILAUDID) injection 0.25-0.5 mg  0.25-0.5 mg Intravenous Q5 min PRN Lillia Abed, MD      . insulin aspart (novoLOG) injection 0-15 Units  0-15 Units Subcutaneous TID WC Samella Parr,  NP      . insulin aspart (novoLOG) injection 0-5 Units  0-5 Units Subcutaneous QHS Samella Parr, NP   0 Units at 01/10/15 2200  . lactulose (CHRONULAC) 10 GM/15ML solution 20 g  20 g Oral Daily Samella Parr, NP   20 g at 01/11/15 1045  . levothyroxine (SYNTHROID, LEVOTHROID) tablet 88 mcg  88 mcg Oral QAC breakfast Samella Parr, NP   88 mcg at 01/11/15 1046  . lidocaine (XYLOCAINE) 1 % (with pres) injection           . meperidine (DEMEROL) injection 6.25-12.5 mg  6.25-12.5 mg Intravenous Q5 min PRN Lillia Abed, MD      . metoprolol succinate (TOPROL-XL) 24 hr tablet 12.5 mg  12.5 mg Oral Daily Belkys A Regalado, MD   12.5 mg at 01/11/15 1046  . midazolam (VERSED) 2 MG/2ML injection            . ondansetron (ZOFRAN) injection 4 mg  4 mg Intravenous Once PRN Lillia Abed, MD      . ondansetron [facility]) tablet 4 mg  4 mg Oral Q6H PRN Samella Parr, NP       Or  . ondansetron [facility]) injection 4 mg  4 mg Intravenous Q6H PRN Samella Parr, NP      . pravastatin (PRAVACHOL) tablet 10 mg  10 mg Oral q1800 Samella Parr, NP   10 mg at 01/10/15 2300  . terazosin (HYTRIN) capsule 5 mg  5 mg Oral QHS Samella Parr, NP   5 mg at 01/10/15 2300    Allergies: Allergies as of 12/18/2014 - Review Complete 11/28/2014  Allergen Reaction Noted  . Ambien [zolpidem]  10/20/2012   Past Medical History  Diagnosis Date  . Atrial fibrillation ([place])   . Hypertension   . Pacemaker   . Arthritis   . Anxiety   . Depression   . MSSA (methicillin susceptible Staphylococcus aureus) infection   . Bacteremia   . 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 Septic hip ([place])   . Complication of anesthesia     " Terrible Nightmares"  . Dysrhythmia     Afib- had pacemaker, had it removed- takes Aspirin 325- daily  . Diabetes mellitus without complication ([place])     Type II  . Constipation   . History of blood product transfusion   . OAB (overactive bladder)   . CKD (chronic kidney disease)   . Hypothyroidism   . Thoracic spine pain 11/28/2014   Past Surgical History  Procedure Laterality Date  . Pacemaker insertion    . Lumbar laminectomy/decompression microdiscectomy  02/14/2012    Procedure: LUMBAR LAMINECTOMY/DECOMPRESSION MICRODISCECTOMY 1 LEVEL;  Surgeon: Eustace Moore, MD;  Location: [place] [facility];  Service: Neurosurgery;  Laterality: N/A;  Thoracic twelve - Lumbar one decompressive laminectomy.  Darden Dates without cardioversion N/A 08/30/2012    Procedure: TRANSESOPHAGEAL ECHOCARDIOGRAM (TEE);  Surgeon: Larey Dresser, MD;  Location: [place];  Service: Cardiovascular;  Laterality: N/A;  . Icd lead removal Left 08/31/2012    Procedure: ICD LEAD REMOVAL;  Surgeon: Evans Lance, MD;  Location:  [place];  Service: Cardiovascular;  Laterality: Left;  . Incision and drainage hip Right 09/02/2012    Procedure: IRRIGATION AND DEBRIDEMENT RIGHT HIP;  Surgeon: Kerin Salen, MD;  Location: [place];  Service: Orthopedics;  Laterality: Right;  . Total hip revision Right 09/02/2012    Procedure: REVISION OF POLYETHYLENE LINER AND FEMORAL HEAD ;  Surgeon: Kerin Salen, MD;  Location: [place];  Service: Orthopedics;  Laterality: Right;  . Hip closed reduction Right 10/02/2012    Procedure: CLOSED REDUCTION HIP;  Surgeon: Hessie Dibble, MD;  Location: [place];  Service: Orthopedics;  Laterality: Right;  . Total hip revision Right 11/28/2012    Procedure: TOTAL HIP REVISION With Placement of Contrain Liner;  Surgeon: Kerin Salen, MD;  Location: [place];  Service: Orthopedics;  Laterality: Right;  . Colonoscopy w/ polypectomy      small  . Incision and drainage hip Right 08/10/2014    Procedure: IRRIGATION AND DEBRIDEMENT RIGHT HIP SUPERFICIAL VS DEEP;  Surgeon: Frederik Pear, MD;  Location: [place];  Service: Orthopedics;  Laterality: Right;  . Tonsillectomy    . Eye surgery      bilateral  . Joint replacement    . Radiology with anesthesia N/A 01/10/2015    Procedure: MRI LUMBAR SPINE WITH/WITHOUT   (RADIOLOGY WITH ANESTHESIA);  Surgeon: Medication Radiologist, MD;  Location: [place];  Service: Radiology;  Laterality: N/A;   History reviewed. No pertinent family history. Social History   Social History  . Marital Status: Married    Spouse Name: N/A  . Number of Children: N/A  . Years of Education: N/A   Occupational History  . Not on file.   Social History Main Topics  . Smoking status: Never Smoker   . Smokeless tobacco: Never Used  . Alcohol Use: No  . Drug Use: No  . Sexual Activity: Not on file   Other Topics Concern  . Not on file   Social History Narrative    Review of Systems: Review of Systems  Constitutional: Negative for fever, chills and diaphoresis.  Respiratory: Positive for  cough and sputum production. Negative for hemoptysis, shortness of breath and wheezing.        Cough productive of light yellow sputum  Cardiovascular: Positive for leg swelling. Negative for chest pain and palpitations.  Gastrointestinal: Negative for nausea, vomiting, abdominal pain, diarrhea and constipation.  Genitourinary: Negative for dysuria and hematuria.  Musculoskeletal: Positive for back pain and joint pain. Negative for falls.  Neurological: Negative for dizziness, tingling, sensory change and headaches.     Physical Exam: Blood pressure 146/91, pulse 76, temperature 98 F (36.7 C), temperature source Oral, resp. rate 19, height 5' 11.5" (1.816 m), weight 241 lb 10 oz (109.6 kg), SpO2 99 %. Physical Exam  Constitutional: He is oriented to person, place, and time. He appears well-developed and well-nourished. No distress.  Pleasant, elderly gentleman  HENT:  Head: Normocephalic and atraumatic.  Mouth/Throat: Oropharynx is clear and moist.  Eyes: EOM are normal. Pupils are equal, round, and reactive to light.  Neck: Normal range of motion. Neck supple.  Cardiovascular: Normal rate.  An irregularly irregular rhythm present.  Pulses:      Dorsalis pedis pulses are 2+ on the right side, and 2+ on the left side.  Pulmonary/Chest: Effort normal. No respiratory distress. He has no wheezes. He has no rales.  Abdominal: Soft. Bowel sounds are normal. There is no tenderness.  Musculoskeletal:       Cervical back: He exhibits no tenderness.       Thoracic back: He exhibits tenderness.       Lumbar back: He exhibits tenderness.  Decreased ROM of RLE, otherwise intact  Lymphadenopathy:    He has no cervical adenopathy.  Neurological: He is alert and oriented to person, place, and time. No sensory deficit.  Skin: Skin is warm.  Psychiatric: He has a normal mood and affect.     Lab results: Basic Metabolic Panel:  Recent Labs  01/10/15 2000 01/11/15 0540  NA 140 139  K 4.2  4.2  CL 109 109  CO2 25 25  GLUCOSE 93 88  BUN 12 11  CREATININE 1.27* 1.27*  CALCIUM 8.9 8.7*   Liver Function Tests:  Recent Labs  01/11/15 0540  AST 36  ALT 28  ALKPHOS 120  BILITOT 1.2  PROT 5.7*  ALBUMIN 2.8*   No results for input(s): LIPASE, AMYLASE in the last 72 hours. No results for input(s): AMMONIA in the last 72 hours. CBC:  Recent Labs  01/10/15 0640 01/11/15 0540  WBC 6.4 5.9  HGB 10.2* 10.2*  HCT 31.4* 31.2*  MCV 94.0 94.8  PLT 125* 121*   Cardiac Enzymes: No results for input(s): CKTOTAL, CKMB, CKMBINDEX, TROPONINI in the last 72 hours. BNP: No results for input(s): PROBNP in the last 72 hours. D-Dimer: No results for input(s): DDIMER in the last 72 hours. CBG:  Recent Labs  01/10/15 0821 01/10/15 1343 01/10/15 1936 01/10/15 2058 01/11/15 0807 01/11/15 1506  GLUCAP 93 101* 79 101* 84 76   Hemoglobin A1C:  Recent Labs  01/10/15 1840  HGBA1C 6.0*   Fasting Lipid Panel: No results for input(s): CHOL, HDL, LDLCALC, TRIG, CHOLHDL, LDLDIRECT in the last 72 hours. Thyroid Function Tests:  Recent Labs  01/10/15 1840  TSH 2.763   Anemia Panel: No results for input(s): VITAMINB12, FOLATE, FERRITIN, TIBC, IRON, RETICCTPCT in the last 72 hours. Coagulation:  Recent Labs  01/11/15 0540  LABPROT 15.3*  INR 1.19   Urine Drug Screen: Drugs of Abuse  No results found for: LABOPIA, COCAINSCRNUR, LABBENZ, AMPHETMU, THCU, LABBARB  Alcohol Level: No results for input(s): ETH in the last 72 hours. Urinalysis: No results for input(s): COLORURINE, LABSPEC, PHURINE, GLUCOSEU, HGBUR, BILIRUBINUR, KETONESUR, PROTEINUR, UROBILINOGEN, NITRITE, LEUKOCYTESUR in the last 72 hours.  Invalid input(s): APPERANCEUR  Imaging results:  Mr Thoracic Spine W Wo Contrast  01/10/2015  ADDENDUM REPORT: 01/10/2015 14:36 ADDENDUM: Study discussed by telephone with Dr. Rhina Brackett DAM on 01/10/2015 at 1416 hours. Electronically Signed   By: Genevie Ann M.D.    On: 01/10/2015 14:36  01/10/2015  CLINICAL DATA:  81 year old male with MSSA bacteremia in 2014 resulting in large right paraspinal abscess, right hip osteomyelitis and septic hip arthroplasty. Those required right hip surgery and removal of pacemaker. Recurrent MSSA infection, status post 6 weeks of IV antibiotics but new thoracic spine and rib pain. Subsequent encounter. Exam under anesthesia. EXAM: MRI THORACIC AND LUMBAR SPINE WITHOUT AND WITH CONTRAST TECHNIQUE: Multiplanar and multiecho pulse sequences of the thoracic and lumbar spine were obtained without and with intravenous contrast. CONTRAST:  10m MULTIHANCE GADOBENATE DIMEGLUMINE 529 MG/ML IV SOLN COMPARISON:  Lumbar spine CT 08/27/2012. Thoracic spine CT 02/13/2012. FINDINGS: MR THORACIC SPINE FINDINGS Limited sagittal imaging of the cervical spine is remarkable for chronic disc and endplate degeneration, but no cervical spine marrow signal changes. Bulky cervical endplate osteophytes, probably resulting in 1 0 more levels of cervical ankylosis. Similar bulky thoracic endplate osteophytes with suspected multilevel thoracic ankylosis. Abnormal fluid signal in the T8-T9 disc and tracking ventral through what appears to be a fracture of the anterior inferior T9 vertebral body corner series 400, image 11). Surrounding marrow edema and abnormal vertebral body enhancement there is trace disc fluid signal with abnormal enhancement also at T8-T9 (series 400, image 11). However  there is no T10 superior or T8 inferior T8 endplate edema or enhancement. The T12-L1 level is described in the lumbar section below. No other acute osseous abnormality in the thoracic spine. No thoracic spinal stenosis above T12. Thoracic spinal cord above T12 appears normal. No abnormal thoracic intradural enhancement. No associated paraspinal abscess. Abnormal signal in the lungs dependently. Given the associated anesthesia for this study favor this represents atelectasis with trace  pleural fluid. Dolichoectatic descending thoracic aorta with atherosclerosis, but otherwise appears negative. Trace fluid in the trachea related to intubation for exam under anesthesia. MR LUMBAR SPINE FINDINGS Bulky lumbar endplate osteophytosis with multilevel associated lumbar ankylosis similar to that in the cervical and thoracic spine. Transitional lumbosacral anatomy with a partially lumbarized S1 level (see series 1600, image 10). At the T12-L1 level there is abnormal fluid throughout the disc space, more so anteriorly. There is mild endplate marrow edema and enhancement. Evidence of previous surgery posteriorly at this level. Degenerative multifactorial spinal stenosis with mild spinal cord mass effect (series 1600, image 11). No definite abnormal cord signal here which is just above the conus which terminates at L1-L2. There is small volume abnormal fluid signal and enhancement in the anterior and left lateral disc space at L4-L5 (series 1700, image 11). No associated endplate enhancement. Small enhancing degenerative Schmorl node at the L5 inferior endplate incidentally noted. No other acute osseous lesion or suspicious marrow changes. Visible sacrum intact. Partially visible bladder distension.  Cholelithiasis. IMPRESSION: 1. Widespread spinal ankylosis, favor related to diffuse idiopathic skeletal hyperostosis. Chronic postoperative changes to the posterior elements of the thoracolumbar junction and throughout the lower lumbar spine. 2. Acute discitis osteomyelitis at T9-T10, T12-L1. Early discitis at T8-T9 and L4-L5. No epidural or paraspinal abscess. 3. Superimposed degenerative spinal stenosis at T12-L1 with spinal cord mass effect but no definite cord signal abnormality. 4. Abnormal lung bases favor due to atelectasis and trace pleural effusions. Electronically Signed: By: Genevie Ann M.D. On: 01/10/2015 14:07   Mr Lumbar Spine W Wo Contrast  01/10/2015  ADDENDUM REPORT: 01/10/2015 14:36 ADDENDUM:  Study discussed by telephone with Dr. Rhina Brackett DAM on 01/10/2015 at 1416 hours. Electronically Signed   By: Genevie Ann M.D.   On: 01/10/2015 14:36  01/10/2015  CLINICAL DATA:  81 year old male with MSSA bacteremia in 2014 resulting in large right paraspinal abscess, right hip osteomyelitis and septic hip arthroplasty. Those required right hip surgery and removal of pacemaker. Recurrent MSSA infection, status post 6 weeks of IV antibiotics but new thoracic spine and rib pain. Subsequent encounter. Exam under anesthesia. EXAM: MRI THORACIC AND LUMBAR SPINE WITHOUT AND WITH CONTRAST TECHNIQUE: Multiplanar and multiecho pulse sequences of the thoracic and lumbar spine were obtained without and with intravenous contrast. CONTRAST:  77m MULTIHANCE GADOBENATE DIMEGLUMINE 529 MG/ML IV SOLN COMPARISON:  Lumbar spine CT 08/27/2012. Thoracic spine CT 02/13/2012. FINDINGS: MR THORACIC SPINE FINDINGS Limited sagittal imaging of the cervical spine is remarkable for chronic disc and endplate degeneration, but no cervical spine marrow signal changes. Bulky cervical endplate osteophytes, probably resulting in 1 0 more levels of cervical ankylosis. Similar bulky thoracic endplate osteophytes with suspected multilevel thoracic ankylosis. Abnormal fluid signal in the T8-T9 disc and tracking ventral through what appears to be a fracture of the anterior inferior T9 vertebral body corner series 400, image 11). Surrounding marrow edema and abnormal vertebral body enhancement there is trace disc fluid signal with abnormal enhancement also at T8-T9 (series 400, image 11). However there is no T10 superior  or T8 inferior T8 endplate edema or enhancement. The T12-L1 level is described in the lumbar section below. No other acute osseous abnormality in the thoracic spine. No thoracic spinal stenosis above T12. Thoracic spinal cord above T12 appears normal. No abnormal thoracic intradural enhancement. No associated paraspinal abscess.  Abnormal signal in the lungs dependently. Given the associated anesthesia for this study favor this represents atelectasis with trace pleural fluid. Dolichoectatic descending thoracic aorta with atherosclerosis, but otherwise appears negative. Trace fluid in the trachea related to intubation for exam under anesthesia. MR LUMBAR SPINE FINDINGS Bulky lumbar endplate osteophytosis with multilevel associated lumbar ankylosis similar to that in the cervical and thoracic spine. Transitional lumbosacral anatomy with a partially lumbarized S1 level (see series 1600, image 10). At the T12-L1 level there is abnormal fluid throughout the disc space, more so anteriorly. There is mild endplate marrow edema and enhancement. Evidence of previous surgery posteriorly at this level. Degenerative multifactorial spinal stenosis with mild spinal cord mass effect (series 1600, image 11). No definite abnormal cord signal here which is just above the conus which terminates at L1-L2. There is small volume abnormal fluid signal and enhancement in the anterior and left lateral disc space at L4-L5 (series 1700, image 11). No associated endplate enhancement. Small enhancing degenerative Schmorl node at the L5 inferior endplate incidentally noted. No other acute osseous lesion or suspicious marrow changes. Visible sacrum intact. Partially visible bladder distension.  Cholelithiasis. IMPRESSION: 1. Widespread spinal ankylosis, favor related to diffuse idiopathic skeletal hyperostosis. Chronic postoperative changes to the posterior elements of the thoracolumbar junction and throughout the lower lumbar spine. 2. Acute discitis osteomyelitis at T9-T10, T12-L1. Early discitis at T8-T9 and L4-L5. No epidural or paraspinal abscess. 3. Superimposed degenerative spinal stenosis at T12-L1 with spinal cord mass effect but no definite cord signal abnormality. 4. Abnormal lung bases favor due to atelectasis and trace pleural effusions. Electronically Signed:  By: Genevie Ann M.D. On: 01/10/2015 14:07   Ir Fluoro Guide Cv Line Right  01/11/2015  INDICATION: Patient with history of discitis/osteomyelitis of thoracolumbar spine; central venous access is requested for fluids and medications. EXAM: ULTRASOUND AND FLUOROSCOPIC GUIDED PICC LINE INSERTION MEDICATIONS: None. CONTRAST:  None FLUOROSCOPY TIME:  48 seconds. COMPLICATIONS: None immediate TECHNIQUE: The procedure, risks, benefits, and alternatives were explained to the patient and informed written consent was obtained. A timeout was performed prior to the initiation of the procedure. The right upper extremity was prepped with chlorhexidine in a sterile fashion, and a sterile drape was applied covering the operative field. Maximum barrier sterile technique with sterile gowns and gloves were used for the procedure. A timeout was performed prior to the initiation of the procedure. Local anesthesia was provided with 1% lidocaine. Under direct ultrasound guidance, the right basilic vein was accessed with a micropuncture kit after the overlying soft tissues were anesthetized with 1% lidocaine. An ultrasound image was saved for documentation purposes. A guidewire was advanced to the level of the superior caval-atrial junction for measurement purposes and the PICC line was cut to length. A peel-away sheath was placed and a 44 cm, 5 [place], dual lumen was inserted to level of the superior caval-atrial junction. A post procedure spot fluoroscopic was obtained. The catheter easily aspirated and flushed and was sutured in place. A dressing was placed. The patient tolerated the procedure well without immediate post procedural complication. FINDINGS: After catheter placement, the tip lies within the superior cavoatrial junction. The catheter aspirates and flushes normally and is ready for  immediate use. IMPRESSION: Successful ultrasound and fluoroscopic guided placement of a right basilic vein approach, 44 cm, 5 French, dual lumen  PICC with tip at the superior caval-atrial junction. The PICC line is ready for immediate use. Read by: Rowe Robert, PA-C Electronically Signed   By: Marybelle Killings M.D.   On: 01/11/2015 13:58   Ir US Guide Vasc Access Right  01/11/2015  INDICATION: Patient with history of discitis/osteomyelitis of thoracolumbar spine; central venous access is requested for fluids and medications. EXAM: ULTRASOUND AND FLUOROSCOPIC GUIDED PICC LINE INSERTION MEDICATIONS: None. CONTRAST:  None FLUOROSCOPY TIME:  48 seconds. COMPLICATIONS: None immediate TECHNIQUE: The procedure, risks, benefits, and alternatives were explained to the patient and informed written consent was obtained. A timeout was performed prior to the initiation of the procedure. The right upper extremity was prepped with chlorhexidine in a sterile fashion, and a sterile drape was applied covering the operative field. Maximum barrier sterile technique with sterile gowns and gloves were used for the procedure. A timeout was performed prior to the initiation of the procedure. Local anesthesia was provided with 1% lidocaine. Under direct ultrasound guidance, the right basilic vein was accessed with a micropuncture kit after the overlying soft tissues were anesthetized with 1% lidocaine. An ultrasound image was saved for documentation purposes. A guidewire was advanced to the level of the superior caval-atrial junction for measurement purposes and the PICC line was cut to length. A peel-away sheath was placed and a 44 cm, 5 [place], dual lumen was inserted to level of the superior caval-atrial junction. A post procedure spot fluoroscopic was obtained. The catheter easily aspirated and flushed and was sutured in place. A dressing was placed. The patient tolerated the procedure well without immediate post procedural complication. FINDINGS: After catheter placement, the tip lies within the superior cavoatrial junction. The catheter aspirates and flushes normally and is  ready for immediate use. IMPRESSION: Successful ultrasound and fluoroscopic guided placement of a right basilic vein approach, 44 cm, 5 French, dual lumen PICC with tip at the superior caval-atrial junction. The PICC line is ready for immediate use. Read by: Rowe Robert, PA-C Electronically Signed   By: Marybelle Killings M.D.   On: 01/11/2015 13:58   Ir Fluoro Guide Ndl Plmt / Bx  01/11/2015  CLINICAL DATA:  T12-L1 discitis EXAM: IR FLUORO GUIDE NEEDLE PLACEMENT /BIOPSY FLUOROSCOPY TIME:  3 minutes and 36 seconds MEDICATIONS AND MEDICAL HISTORY: Versed 2 mg, Fentanyl 100 mcg. ANESTHESIA/SEDATION: Moderate sedation time: 15 minutes CONTRAST:  None PROCEDURE: The procedure, risks, benefits, and alternatives were explained to the patient. Questions regarding the procedure were encouraged and answered. The patient understands and consents to the procedure. The back was prepped with Betadine in a sterile fashion, and a sterile drape was applied covering the operative field. A sterile gown and sterile gloves were used for the procedure. Under fluoroscopic guidance, an 18 gauge needle was inserted into the T12-L1 disc space via left posterior lateral approach. 2 cc serosanguineous fluid was aspirated. FINDINGS: Images document needle placement in the T12-L1 disc space. COMPLICATIONS: None IMPRESSION: Successful T12-L1 disc aspiration. Electronically Signed   By: Marybelle Killings M.D.   On: 01/11/2015 14:59     Assessment, Plan, & Recommendations by Problem: Principal Problem:   Discitis of thoracolumbar region Active Problems:   HTN (hypertension)   Anemia of chronic disease   CKD (chronic kidney disease), stage III   Chronic atrial fibrillation (HCC)   Hypothyroidism   HLD (hyperlipidemia)   BPH (benign  prostatic hyperplasia)   DM type 2 (diabetes mellitus, type 2) (HCC)   Osteomyelitis of thoracic spine (HCC)  Discitis/Osteomyelitis of thoracolumbar spine: Patient has been taking Keflex 1 g BID daily on  outpatient basis. Keflex was held prior to disc aspiration procedure. PICC line placed today (01/11/15) and T12/L1 Disk aspiration completed by IR. -Start IV Ancef 2 grams TID, adjust based on micro results. Will likely need to be on for 6 weeks. -f/u blood cultures -f/u disc aspiration cultures (collected 01/11/15)  HTN: Blood pressure slightly elevated -Continue to monitor, management per primary team  CKD stage III: SCr 1.27, elevated at baseline (1.4-1.6) -Continue to monitor  Chronic Atrial fibrillation: Not on anticoagulation due to previous retroperitoneal bleed -rate controlled, continue management per primary team  Signed: Zada Finders, MD 01/11/2015, 5:24 PM   Agree with Dr Serita Grit outstanding note WIll restart ancef Await his Cx.

## 2015-01-11 NOTE — Sedation Documentation (Signed)
Pt needing to lay supine for IR PICC line placement, pt reports mild discomfort to his lower back. Pillow placed under bilateral knee to relieve pressure, pt continues to experience some discomfort. Pt denies severe pain with position. Dr. Bonnielee HaffHoss contacted, verbal order obtained for Fentanyl 50 mcg, order verified and read back with this RN and Dr. Bonnielee HaffHoss. RN will remain in room during PICC line placement.  Victorino DikeLeslie Dequavious Harshberger RN

## 2015-01-11 NOTE — Procedures (Addendum)
T12/L1 Disk aspiration 2 cc serosanguinous fluid No comp/EBL

## 2015-01-11 NOTE — Consult Note (Signed)
Chief Complaint: Patient was seen in consultation today for disc aspiration at the request of [facility]  Referring Physician(s): Dr Barnie Del  History of Present Illness: [name] is a 81 y.o. male   Pt with hx many Rt hip surgeries; replacement; sepsis; debridement; revision 2014- 2016 MSSA bacteremia 07/2014 Hx lumbar laminectomy 2014 Was feeling well til new onset back pain few weeks now MRI 01/10/15:1. Widespread spinal ankylosis, favor related to diffuse idiopathic skeletal hyperostosis. Chronic postoperative changes to the posterior elements of the thoracolumbar junction and throughout the lower lumbar spine. 2. Acute discitis osteomyelitis at T9-T10, T12-L1. Early discitis at T8-T9 and L4-L5. No epidural or paraspinal abscess. 3. Superimposed degenerative spinal stenosis at T12-L1 with spinal cord mass effect but no definite cord signal abnormality. 4. Abnormal lung bases favor due to atelectasis and trace pleural Effusions.  Now request for disc aspiration for determination of bacteria and treatment Dr Bonnielee Haff has reviewed imaging and approves T12-L1 disc aspiration I have seen and examined pt  Past Medical History  Diagnosis Date  . Atrial fibrillation (HCC)   . Hypertension   . Pacemaker   . Arthritis   . Anxiety   . Depression   . MSSA (methicillin susceptible Staphylococcus aureus) infection   . Bacteremia   . Infected pacemaker (HCC)   . Septic hip (HCC)   . Complication of anesthesia     " Terrible Nightmares"  . Dysrhythmia     Afib- had pacemaker, had it removed- takes Aspirin 325- daily  . Diabetes mellitus without complication (HCC)     Type II  . Constipation   . History of blood product transfusion   . OAB (overactive bladder)   . CKD (chronic kidney disease)   . Hypothyroidism   . Thoracic spine pain 11/28/2014    Past Surgical History  Procedure Laterality Date  . Pacemaker insertion    . Lumbar laminectomy/decompression  microdiscectomy  02/14/2012    Procedure: LUMBAR LAMINECTOMY/DECOMPRESSION MICRODISCECTOMY 1 LEVEL;  Surgeon: Tia Alert, MD;  Location: [facility]UR[facility] ORS;  Service: Neurosurgery;  Laterality: N/A;  Thoracic twelve - Lumbar one decompressive laminectomy.  Rhae Hammock without cardioversion N/A 08/30/2012    Procedure: TRANSESOPHAGEAL ECHOCARDIOGRAM (TEE);  Surgeon: Laurey Morale, MD;  Location: [facility] ENDOSCOPY;  Service: Cardiovascular;  Laterality: N/A;  . Icd lead removal Left 08/31/2012    Procedure: ICD LEAD REMOVAL;  Surgeon: Marinus Maw, MD;  Location: [facility] OR;  Service: Cardiovascular;  Laterality: Left;  . Incision and drainage hip Right 09/02/2012    Procedure: IRRIGATION AND DEBRIDEMENT RIGHT HIP;  Surgeon: Nestor Lewandowsky, MD;  Location: [facility] OR;  Service: Orthopedics;  Laterality: Right;  . Total hip revision Right 09/02/2012    Procedure: REVISION OF POLYETHYLENE LINER AND FEMORAL HEAD ;  Surgeon: Nestor Lewandowsky, MD;  Location: [facility] OR;  Service: Orthopedics;  Laterality: Right;  . Hip closed reduction Right 10/02/2012    Procedure: CLOSED REDUCTION HIP;  Surgeon: Velna Ochs, MD;  Location: [facility] OR;  Service: Orthopedics;  Laterality: Right;  . Total hip revision Right 11/28/2012    Procedure: TOTAL HIP REVISION With Placement of Contrain Liner;  Surgeon: Nestor Lewandowsky, MD;  Location: [facility] OR;  Service: Orthopedics;  Laterality: Right;  . Colonoscopy w/ polypectomy      small  . Incision and drainage hip Right 08/10/2014    Procedure: IRRIGATION AND DEBRIDEMENT RIGHT HIP SUPERFICIAL VS DEEP;  Surgeon: Gean Birchwood, MD;  Location: [facility] OR;  Service: Orthopedics;  Laterality: Right;  . Tonsillectomy    . Eye surgery      bilateral  . Joint replacement      Allergies: Ambien  Medications: Prior to Admission medications   Medication Sig Start Date End Date Taking? Authorizing Provider  acetaminophen (TYLENOL) 500 MG tablet Take 750 mg by mouth daily as needed (pain).   Yes Historical Provider, MD    ALPRAZolam Prudy Feeler) 0.5 MG tablet Take 0.5 mg by mouth daily as needed for anxiety.    Yes Historical Provider, MD  aspirin EC 325 MG tablet Take 1 tablet (325 mg total) by mouth 2 (two) times daily. Patient taking differently: Take 325 mg by mouth daily.  08/13/14  Yes Allena Katz, PA-C  cephALEXin (KEFLEX) 500 MG capsule Take 2 capsules (1,000 mg total) by mouth 2 (two) times daily. Two capsules (1,000 mg total) in the morning and one capsule (500 mg) at night. Patient taking differently: Take 1,000 mg by mouth 2 (two) times daily. Continuous course 11/28/14  Yes Randall Hiss, MD  diltiazem (CARDIZEM CD) 120 MG 24 hr capsule Take 120 mg by mouth daily. 07/09/14  Yes Historical Provider, MD  ferrous sulfate 325 (65 FE) MG tablet Take 325 mg by mouth daily with breakfast.   Yes Historical Provider, MD  finasteride (PROSCAR) 5 MG tablet Take 5 mg by mouth at bedtime.    Yes Historical Provider, MD  HYDROcodone-acetaminophen (NORCO/VICODIN) 5-325 MG tablet Take 1 tablet by mouth 2 (two) times daily as needed (pain).   Yes Historical Provider, MD  lactulose (CHRONULAC) 10 GM/15ML solution Take 20 g by mouth daily.   Yes Historical Provider, MD  levothyroxine (SYNTHROID, LEVOTHROID) 88 MCG tablet Take 88 mcg by mouth daily before breakfast.   Yes Historical Provider, MD  lovastatin (MEVACOR) 40 MG tablet Take 40 mg by mouth at bedtime.  07/09/14  Yes Historical Provider, MD  metolazone (ZAROXOLYN) 5 MG tablet Take 2.5 mg by mouth every Monday, Wednesday, and Friday.    Yes Historical Provider, MD  metoprolol succinate (TOPROL-XL) 25 MG 24 hr tablet Take 12.5 mg by mouth daily. 11/02/14  Yes Historical Provider, MD  oxyCODONE (OXY IR/ROXICODONE) 5 MG immediate release tablet Take 5 mg by mouth daily as needed for severe pain (pain).   Yes Historical Provider, MD  pioglitazone (ACTOS) 15 MG tablet Take 15 mg by mouth daily.  03/10/12  Yes Historical Provider, MD  potassium chloride SA  (K-DUR,KLOR-CON) 20 MEQ tablet Take 1 tablet (20 mEq total) by mouth daily. Patient taking differently: Take 20 mEq by mouth 2 (two) times daily.  12/30/12  Yes Rolan Bucco, MD  Probiotic Product (PROBIOTIC DAILY PO) Take 1 capsule by mouth daily.    Yes Historical Provider, MD  terazosin (HYTRIN) 5 MG capsule Take 5 mg by mouth at bedtime.  11/15/12  Yes Historical Provider, MD  torsemide (DEMADEX) 20 MG tablet Take 20 mg by mouth daily.  06/05/13  Yes Historical Provider, MD  diazepam (VALIUM) 5 MG tablet Take 1 tablet (5 mg total) by mouth every 8 (eight) hours as needed for anxiety. Patient not taking: Reported on 01/04/2015 11/28/14   Randall Hiss, MD  docusate sodium 100 MG CAPS Take 100 mg by mouth 2 (two) times daily. Patient not taking: Reported on 01/04/2015 09/08/12   Drema Dallas, MD  ONE TOUCH ULTRA TEST test strip  11/07/14   Historical Provider, MD  tiZANidine (ZANAFLEX) 2 MG tablet Take  1 tablet (2 mg total) by mouth every 6 (six) hours as needed for muscle spasms. Patient not taking: Reported on 11/28/2014 08/13/14   Allena Katz, PA-C     History reviewed. No pertinent family history.  Social History   Social History  . Marital Status: Married    Spouse Name: N/A  . Number of Children: N/A  . Years of Education: N/A   Social History Main Topics  . Smoking status: Never Smoker   . Smokeless tobacco: Never Used  . Alcohol Use: No  . Drug Use: No  . Sexual Activity: Not Asked   Other Topics Concern  . None   Social History Narrative    Review of Systems: A 12 point ROS discussed and pertinent positives are indicated in the HPI above.  All other systems are negative.  Review of Systems  Constitutional: Positive for activity change and fatigue. Negative for fever.  Respiratory: Negative for shortness of breath.   Cardiovascular: Negative for chest pain.  Musculoskeletal: Positive for back pain.  Neurological: Positive for weakness.   Psychiatric/Behavioral: Negative for behavioral problems and confusion.    Vital Signs: BP 135/58 mmHg  Pulse 77  Temp(Src) 98.3 F (36.8 C) (Oral)  Resp 18  Ht 5' 11.5" (1.816 m)  Wt 241 lb 10 oz (109.6 kg)  BMI 33.23 kg/m2  SpO2 98%  Physical Exam  Constitutional: He is oriented to person, place, and time. He appears well-nourished.  Cardiovascular: Normal rate and regular rhythm.   Pulmonary/Chest: Effort normal and breath sounds normal.  Abdominal: Soft. Bowel sounds are normal.  Musculoskeletal: Normal range of motion.  Neurological: He is alert and oriented to person, place, and time.  Skin: Skin is warm and dry.  Psychiatric: He has a normal mood and affect. His behavior is normal. Judgment and thought content normal.  Nursing note and vitals reviewed.   Mallampati Score:  MD Evaluation Airway: WNL Heart: WNL Abdomen: WNL Chest/ Lungs: WNL ASA  Classification: 3 Mallampati/Airway Score: Two  Imaging: Mr Thoracic Spine Vivien Rossetti Contrast  01/10/2015  ADDENDUM REPORT: 01/10/2015 14:36 ADDENDUM: Study discussed by telephone with Dr. Paulette Blanch DAM on 01/10/2015 at 1416 hours. Electronically Signed   By: Odessa Fleming M.D.   On: 01/10/2015 14:36  01/10/2015  CLINICAL DATA:  81 year old male with MSSA bacteremia in 2014 resulting in large right paraspinal abscess, right hip osteomyelitis and septic hip arthroplasty. Those required right hip surgery and removal of pacemaker. Recurrent MSSA infection, status post 6 weeks of IV antibiotics but new thoracic spine and rib pain. Subsequent encounter. Exam under anesthesia. EXAM: MRI THORACIC AND LUMBAR SPINE WITHOUT AND WITH CONTRAST TECHNIQUE: Multiplanar and multiecho pulse sequences of the thoracic and lumbar spine were obtained without and with intravenous contrast. CONTRAST:  20mL MULTIHANCE GADOBENATE DIMEGLUMINE 529 MG/ML IV SOLN COMPARISON:  Lumbar spine CT 08/27/2012. Thoracic spine CT 02/13/2012. FINDINGS: MR THORACIC SPINE  FINDINGS Limited sagittal imaging of the cervical spine is remarkable for chronic disc and endplate degeneration, but no cervical spine marrow signal changes. Bulky cervical endplate osteophytes, probably resulting in 1 0 more levels of cervical ankylosis. Similar bulky thoracic endplate osteophytes with suspected multilevel thoracic ankylosis. Abnormal fluid signal in the T8-T9 disc and tracking ventral through what appears to be a fracture of the anterior inferior T9 vertebral body corner series 400, image 11). Surrounding marrow edema and abnormal vertebral body enhancement there is trace disc fluid signal with abnormal enhancement also at T8-T9 (series 400, image 11).  However there is no T10 superior or T8 inferior T8 endplate edema or enhancement. The T12-L1 level is described in the lumbar section below. No other acute osseous abnormality in the thoracic spine. No thoracic spinal stenosis above T12. Thoracic spinal cord above T12 appears normal. No abnormal thoracic intradural enhancement. No associated paraspinal abscess. Abnormal signal in the lungs dependently. Given the associated anesthesia for this study favor this represents atelectasis with trace pleural fluid. Dolichoectatic descending thoracic aorta with atherosclerosis, but otherwise appears negative. Trace fluid in the trachea related to intubation for exam under anesthesia. MR LUMBAR SPINE FINDINGS Bulky lumbar endplate osteophytosis with multilevel associated lumbar ankylosis similar to that in the cervical and thoracic spine. Transitional lumbosacral anatomy with a partially lumbarized S1 level (see series 1600, image 10). At the T12-L1 level there is abnormal fluid throughout the disc space, more so anteriorly. There is mild endplate marrow edema and enhancement. Evidence of previous surgery posteriorly at this level. Degenerative multifactorial spinal stenosis with mild spinal cord mass effect (series 1600, image 11). No definite abnormal cord  signal here which is just above the conus which terminates at L1-L2. There is small volume abnormal fluid signal and enhancement in the anterior and left lateral disc space at L4-L5 (series 1700, image 11). No associated endplate enhancement. Small enhancing degenerative Schmorl node at the L5 inferior endplate incidentally noted. No other acute osseous lesion or suspicious marrow changes. Visible sacrum intact. Partially visible bladder distension.  Cholelithiasis. IMPRESSION: 1. Widespread spinal ankylosis, favor related to diffuse idiopathic skeletal hyperostosis. Chronic postoperative changes to the posterior elements of the thoracolumbar junction and throughout the lower lumbar spine. 2. Acute discitis osteomyelitis at T9-T10, T12-L1. Early discitis at T8-T9 and L4-L5. No epidural or paraspinal abscess. 3. Superimposed degenerative spinal stenosis at T12-L1 with spinal cord mass effect but no definite cord signal abnormality. 4. Abnormal lung bases favor due to atelectasis and trace pleural effusions. Electronically Signed: By: Odessa FlemingH  Hall M.D. On: 01/10/2015 14:07   Mr Lumbar Spine W Wo Contrast  01/10/2015  ADDENDUM REPORT: 01/10/2015 14:36 ADDENDUM: Study discussed by telephone with Dr. Paulette BlanchORNELIUS VAN DAM on 01/10/2015 at 1416 hours. Electronically Signed   By: Odessa FlemingH  Hall M.D.   On: 01/10/2015 14:36  01/10/2015  CLINICAL DATA:  81 year old male with MSSA bacteremia in 2014 resulting in large right paraspinal abscess, right hip osteomyelitis and septic hip arthroplasty. Those required right hip surgery and removal of pacemaker. Recurrent MSSA infection, status post 6 weeks of IV antibiotics but new thoracic spine and rib pain. Subsequent encounter. Exam under anesthesia. EXAM: MRI THORACIC AND LUMBAR SPINE WITHOUT AND WITH CONTRAST TECHNIQUE: Multiplanar and multiecho pulse sequences of the thoracic and lumbar spine were obtained without and with intravenous contrast. CONTRAST:  20mL MULTIHANCE GADOBENATE  DIMEGLUMINE 529 MG/ML IV SOLN COMPARISON:  Lumbar spine CT 08/27/2012. Thoracic spine CT 02/13/2012. FINDINGS: MR THORACIC SPINE FINDINGS Limited sagittal imaging of the cervical spine is remarkable for chronic disc and endplate degeneration, but no cervical spine marrow signal changes. Bulky cervical endplate osteophytes, probably resulting in 1 0 more levels of cervical ankylosis. Similar bulky thoracic endplate osteophytes with suspected multilevel thoracic ankylosis. Abnormal fluid signal in the T8-T9 disc and tracking ventral through what appears to be a fracture of the anterior inferior T9 vertebral body corner series 400, image 11). Surrounding marrow edema and abnormal vertebral body enhancement there is trace disc fluid signal with abnormal enhancement also at T8-T9 (series 400, image 11). However there is no T10  superior or T8 inferior T8 endplate edema or enhancement. The T12-L1 level is described in the lumbar section below. No other acute osseous abnormality in the thoracic spine. No thoracic spinal stenosis above T12. Thoracic spinal cord above T12 appears normal. No abnormal thoracic intradural enhancement. No associated paraspinal abscess. Abnormal signal in the lungs dependently. Given the associated anesthesia for this study favor this represents atelectasis with trace pleural fluid. Dolichoectatic descending thoracic aorta with atherosclerosis, but otherwise appears negative. Trace fluid in the trachea related to intubation for exam under anesthesia. MR LUMBAR SPINE FINDINGS Bulky lumbar endplate osteophytosis with multilevel associated lumbar ankylosis similar to that in the cervical and thoracic spine. Transitional lumbosacral anatomy with a partially lumbarized S1 level (see series 1600, image 10). At the T12-L1 level there is abnormal fluid throughout the disc space, more so anteriorly. There is mild endplate marrow edema and enhancement. Evidence of previous surgery posteriorly at this level.  Degenerative multifactorial spinal stenosis with mild spinal cord mass effect (series 1600, image 11). No definite abnormal cord signal here which is just above the conus which terminates at L1-L2. There is small volume abnormal fluid signal and enhancement in the anterior and left lateral disc space at L4-L5 (series 1700, image 11). No associated endplate enhancement. Small enhancing degenerative Schmorl node at the L5 inferior endplate incidentally noted. No other acute osseous lesion or suspicious marrow changes. Visible sacrum intact. Partially visible bladder distension.  Cholelithiasis. IMPRESSION: 1. Widespread spinal ankylosis, favor related to diffuse idiopathic skeletal hyperostosis. Chronic postoperative changes to the posterior elements of the thoracolumbar junction and throughout the lower lumbar spine. 2. Acute discitis osteomyelitis at T9-T10, T12-L1. Early discitis at T8-T9 and L4-L5. No epidural or paraspinal abscess. 3. Superimposed degenerative spinal stenosis at T12-L1 with spinal cord mass effect but no definite cord signal abnormality. 4. Abnormal lung bases favor due to atelectasis and trace pleural effusions. Electronically Signed: By: Odessa Fleming M.D. On: 01/10/2015 14:07    Labs:  CBC:  Recent Labs  09/27/14 1447 01/04/15 1309 01/10/15 0640 01/11/15 0540  WBC 6.3 5.3 6.4 5.9  HGB 11.7* 11.9* 10.2* 10.2*  HCT 35.0* 37.8* 31.4* 31.2*  PLT 206 122* 125* 121*    COAGS:  Recent Labs  01/11/15 0540  INR 1.19    BMP:  Recent Labs  11/28/14 1230 01/04/15 1309 01/10/15 2000 01/11/15 0540  NA 142 141 140 139  K 5.1 4.6 4.2 4.2  CL 107 109 109 109  CO2 27 24 25 25   GLUCOSE 102* 126* 93 88  BUN 24 17 12 11   CALCIUM 9.1 9.4 8.9 8.7*  CREATININE 1.43* 1.28* 1.27* 1.27*  GFRNONAA 45* 50* 51* 51*  GFRAA 52* 58* 59* 59*    LIVER FUNCTION TESTS:  Recent Labs  01/11/15 0540  BILITOT 1.2  AST 36  ALT 28  ALKPHOS 120  PROT 5.7*  ALBUMIN 2.8*    TUMOR  MARKERS: No results for input(s): AFPTM, CEA, CA199, CHROMGRNA in the last 8760 hours.  Assessment and Plan:  Back pain with discitis of T8-9; T9-10; T12-L1; L4-5 Scheduled now for disc aspiration at T12-L1 per Dr Bonnielee Haff Risks and Benefits discussed with the patient including, but not limited to bleeding, infection, damage to adjacent structures or low yield requiring additional tests. All of the patient's questions were answered, patient is agreeable to proceed. Consent signed and in chart.   Thank you for this interesting consult.  I greatly enjoyed meeting [name] and look forward to participating  in their care.  A copy of this report was sent to the requesting provider on this date.  Signed: Vandell Kun A 01/11/2015, 9:48 AM   I spent a total of 40 Minutes    in face to face in clinical consultation, greater than 50% of which was counseling/coordinating care for disc aspiration

## 2015-01-11 NOTE — Progress Notes (Signed)
PT Cancellation Note  Patient Details Name: Joshua BrookeHarda [name] MRN: [id number] DOB: 1931-01-18   Cancelled Treatment:    Reason Eval/Treat Not Completed: Other (comment) (Newly returned from disc procedure, no energy, eating).  Will try again tomorrow.   Ivar DrapeStout, Caster Fayette E 01/11/2015, 3:56 PM   Samul Dadauth Mercadez Heitman, PT MS Acute Rehab Dept. Number: [facility] [phone] and [facility] [phone]

## 2015-01-11 NOTE — Sedation Documentation (Signed)
Pt reports discomfort, additional medication administered

## 2015-01-11 NOTE — Care Management Note (Signed)
Case Management Note  Patient Details  Name: Charlene BrookeHarda Rodwell MRN: [id number] Date of Birth: 20-Feb-1930  Subjective/Objective:  Discitis of thoracolumbar region/ Osteomyelitis of thoracic spine Patient lives with spouse, await pt /ot eval. Patient currently being kept off of IV antibiotics. Infectious disease is following. Patient to be seen by interventional radiology for disc aspiration. Blood cultures have been sent. Infectious disease recommended PICC line  May need HH services.                  Action/Plan:   Expected Discharge Date:                  Expected Discharge Plan:  Home w Home Health Services  In-House Referral:     Discharge planning Services  CM Consult  Post Acute Care Choice:    Choice offered to:     DME Arranged:    DME Agency:     HH Arranged:    HH Agency:     Status of Service:  In process, will continue to follow  Medicare Important Message Given:    Date Medicare IM Given:    Medicare IM give by:    Date Additional Medicare IM Given:    Additional Medicare Important Message give by:     If discussed at Long Length of Stay Meetings, dates discussed:    Additional Comments:  Leone Havenaylor, Mahek Schlesinger Clinton, RN 01/11/2015, 5:58 PM

## 2015-01-11 NOTE — Procedures (Signed)
US/fluoroscopic guided right DL basilic vein PICC placed. Length 44 cm. Tip SVC/RA junction. No immediate complications. Ok to use.

## 2015-01-12 DIAGNOSIS — I10 Essential (primary) hypertension: Secondary | ICD-10-CM

## 2015-01-12 DIAGNOSIS — E039 Hypothyroidism, unspecified: Secondary | ICD-10-CM

## 2015-01-12 LAB — BASIC METABOLIC PANEL
Anion gap: 5 (ref 5–15)
BUN: 9 mg/dL (ref 6–20)
CHLORIDE: 108 mmol/L (ref 101–111)
CO2: 25 mmol/L (ref 22–32)
Calcium: 8.5 mg/dL — ABNORMAL LOW (ref 8.9–10.3)
Creatinine, Ser: 1.2 mg/dL (ref 0.61–1.24)
GFR calc Af Amer: >60 mL/min (ref >60)
GFR, EST NON AFRICAN AMERICAN: 54 mL/min — AB (ref >60)
GLUCOSE: 84 mg/dL (ref 65–99)
POTASSIUM: 3.9 mmol/L (ref 3.5–5.1)
Sodium: 138 mmol/L (ref 135–145)

## 2015-01-12 LAB — CBC
HEMATOCRIT: 31.2 % — AB (ref 39.0–52.0)
Hemoglobin: 10.2 g/dL — ABNORMAL LOW (ref 13.0–17.0)
MCH: 31.1 pg (ref 26.0–34.0)
MCHC: 32.7 g/dL (ref 30.0–36.0)
MCV: 95.1 fL (ref 78.0–100.0)
Platelets: 118 10*3/uL — ABNORMAL LOW (ref 150–400)
RBC: 3.28 MIL/uL — ABNORMAL LOW (ref 4.22–5.81)
RDW: 15.2 % (ref 11.5–15.5)
WBC: 5.7 10*3/uL (ref 4.0–10.5)

## 2015-01-12 LAB — GLUCOSE, CAPILLARY
Glucose-Capillary: 91 mg/dL (ref 65–99)
Glucose-Capillary: 159 mg/dL — ABNORMAL HIGH (ref 65–99)
Glucose-Capillary: 95 mg/dL (ref 65–99)
Glucose-Capillary: 98 mg/dL (ref 65–99)

## 2015-01-12 MED ORDER — SODIUM CHLORIDE 0.9 % IJ SOLN
10.0000 mL | INTRAMUSCULAR | Status: DC | PRN
Start: 2015-01-12 — End: 2015-01-14
  Administered 2015-01-12 – 2015-01-14 (×4): 10 mL
  Filled 2015-01-12 (×4): qty 40

## 2015-01-12 NOTE — Evaluation (Addendum)
Occupational Therapy Evaluation Patient Details Name: [name] MRN: [id number] DOB: [birth year] Today's Date: 01/12/2015    History of Present Illness 81 year old Caucasian male with a past medical history of atrial fibrillation, not on anticoagulation, hypertension, diabetes, chronic kidney disease with a complex orthopedic history involving his right hip with multiple infections. Presented after MRI of his spine revealed discitis at multiple levels. He was hospitalized for further management. 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 Pt able to perform his own ADLs, PTA. Feel pt will benefit from acute OT to increase independence, and activity tolerance prior to d/c.     Follow Up Recommendations  No OT follow up;Supervision - Intermittent    Equipment Recommendations  Other (comment) (AE if wanted)    Recommendations for Other Services       Precautions / Restrictions Precautions Precautions: Fall Restrictions Weight Bearing Restrictions: No      Mobility Bed Mobility Overal bed mobility: Needs Assistance Bed Mobility: Rolling;Sidelying to Sit Rolling: Supervision Sidelying to sit: Min assist   General bed mobility comments: assist to manage LEs. Educated on log roll technique.  Transfers Overall transfer level: Needs assistance Equipment used: Rolling walker (2 wheeled) Transfers: Sit to/from Stand Sit to Stand: Min guard           Balance Used RW for ambulation. Balance not formally assessed.                          ADL Overall ADL's : Needs assistance/impaired                     Lower Body Dressing: Minimal assistance;Sit to/from stand;With adaptive equipment   Toilet Transfer: Ambulation;Minimal assistance;RW (sit to stand from bed)           Functional mobility during ADLs: Minimal assistance;Rolling walker General ADL Comments: mentioned using elastic shoe lace for right  shoe. Pt used sockaid in session.  Recommended someone be with him for shower transfer.     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       Communication Communication Communication: No difficulties   Cognition Arousal/Alertness: Awake/alert Behavior During Therapy: WFL for tasks assessed/performed Overall Cognitive Status: Within Functional Limits for tasks assessed                     General Comments       Exercises       Shoulder Instructions      Home Living Family/patient expects to be discharged to:: Private residence Living Arrangements: Spouse/significant other;Other (Comment) (grandchild) Available Help at Discharge: Family Type of Home: House Home Access: Stairs to enter Entergy CorporationEntrance Stairs-Number of Steps: 3 Entrance Stairs-Rails: None Home Layout: Two level;Able to live on main level with bedroom/bathroom     Bathroom Shower/Tub: Producer, television/film/videoWalk-in shower   Bathroom Toilet: Standard Bathroom Accessibility: Yes   Home Equipment: Environmental consultantWalker - 2 wheels;Cane - single point;Bedside commode;Grab bars - tub/shower;Shower seat - built in;Toilet riser;Adaptive  equipment Adaptive Equipment: Sock aid        Prior Functioning/Environment Level of Independence: Needs assistance  Gait / Transfers Assistance Needed: Ambulates short distances with walker; reports using w/c in community ADL's / Homemaking Assistance Needed: able to dress and bathe self; assist with cooking and cleaning        OT Diagnosis:  Generalized weakness;Acute pain   OT Problem List: Decreased strength;Decreased activity tolerance;Decreased knowledge of use of DME or AE;Decreased knowledge of precautions;Pain   OT Treatment/Interventions: Self-care/ADL training;DME and/or AE instruction;Therapeutic activities;Patient/family education;Balance training;Energy conservation;Therapeutic exercise    OT Goals(Current goals can be found in the care plan section) Acute Rehab OT Goals Patient Stated Goal: not stated OT Goal Formulation: With patient Time For Goal Achievement: 01/19/15 Potential to Achieve Goals: Good ADL Goals Pt Will Perform Lower Body Bathing: with set-up;with supervision;sit to/from stand;with adaptive equipment Pt Will Perform Lower Body Dressing: with set-up;with supervision;with adaptive equipment;sit to/from stand Pt Will Transfer to Toilet: with supervision;ambulating (elevated toilet) Pt Will Perform Toileting - Clothing Manipulation and hygiene: with modified independence;sit to/from stand Pt Will Perform Tub/Shower Transfer: Shower transfer;with supervision;ambulating;shower seat;rolling walker  OT Frequency: Min 2X/week   Barriers to D/C:            Co-evaluation              End of Session Equipment Utilized During Treatment: Gait belt;Rolling walker  Activity Tolerance: Patient tolerated treatment well Patient left: in chair;with call bell/phone within reach;with chair alarm set   Time: 0981-1914 OT Time Calculation (min): 20 min Charges:  OT General Charges $OT Visit: 1 Procedure OT Evaluation $Initial OT Evaluation Tier I: 1 Procedure G-CodesEarlie Raveling OTR/L  [id number] 01/12/2015, 3:18 PM

## 2015-01-12 NOTE — Evaluation (Signed)
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 Pt needs skilled PT to maximize independence and safety to allow discharge to home with wife.     Follow Up Recommendations Supervision for mobility/OOB;Home health PT    Equipment Recommendations  None recommended by PT    Recommendations for Other Services       Precautions / Restrictions Precautions Precautions: Fall      Mobility  Bed Mobility Overal bed mobility: Needs Assistance Bed Mobility: Sit to Supine       Sit to supine: Min assist   General bed mobility comments: Assist to bring RLE back up into bed  Transfers Overall transfer level: Needs assistance Equipment used: Rolling walker (2 wheeled) Transfers: Sit to/from Stand Sit to Stand: Min assist         General transfer comment: Assist to bring hips up. Significant effort by pt  Ambulation/Gait Ambulation/Gait assistance: Min assist Ambulation Distance (Feet): 15 Feet (x 2) Assistive device: Rolling walker (2 wheeled) Gait Pattern/deviations: Step-through pattern;Decreased step length - right;Decreased step length - left;Trunk flexed Gait velocity: decr Gait velocity interpretation: Below normal speed for age/gender General Gait Details: Heavy reliance on UE's which results in pain and fatigue of shoulders  Stairs            Wheelchair Mobility    Modified Rankin (Stroke Patients Only)       Balance Overall balance assessment: Needs assistance Sitting-balance support: No upper extremity supported;Feet supported Sitting balance-Leahy Scale: Good     Standing balance support: Bilateral upper extremity supported Standing balance-Leahy Scale:  Poor Standing balance comment: support of walker and min guard for static standing.                             Pertinent Vitals/Pain Pain Assessment: 0-10 Pain Score: 2  Pain Location: back Pain Descriptors / Indicators: Sore Pain Intervention(s): Limited activity within patient's tolerance;Monitored during session    Home Living Family/patient expects to be discharged to:: Private residence Living Arrangements: Spouse/significant other;Children Available Help at Discharge: Family Type of Home: House Home Access: Stairs to enter Entrance Stairs-Rails: None Entrance Stairs-Number of Steps: 3 Home Layout: Two level;Able to live on main level with bedroom/bathroom Home Equipment: Dan HumphreysWalker - 2 wheels;Cane - single point;Bedside commode;Grab bars - tub/shower      Prior Function Level of Independence: Needs assistance   Gait / Transfers Assistance Needed: Amb short distances with walker           Hand Dominance   Dominant Hand: Right    Extremity/Trunk Assessment   Upper Extremity Assessment: Generalized weakness           Lower Extremity Assessment: Generalized weakness;RLE deficits/detail RLE Deficits / Details: 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       Communication   Communication: No difficulties  Cognition Arousal/Alertness: Awake/alert Behavior During Therapy: WFL for tasks assessed/performed Overall Cognitive Status: Within Functional Limits for tasks assessed                      General Comments      Exercises        Assessment/Plan    PT Assessment Patient  needs continued PT services  PT Diagnosis Difficulty walking;Generalized weakness   PT Problem List Decreased strength;Decreased activity tolerance;Decreased balance;Decreased mobility  PT Treatment Interventions DME instruction;Gait training;Functional mobility training;Therapeutic activities;Therapeutic exercise;Balance training;Patient/family  education   PT Goals (Current goals can be found in the Care Plan section) Acute Rehab PT Goals Patient Stated Goal: return home PT Goal Formulation: With patient Time For Goal Achievement: 01/19/15 Potential to Achieve Goals: Good    Frequency Min 3X/week   Barriers to discharge        Co-evaluation               End of Session Equipment Utilized During Treatment: Gait belt Activity Tolerance: Patient limited by fatigue Patient left: in bed;with call bell/phone within reach;with bed alarm set Nurse Communication: Mobility status         Time: 4098-1191 PT Time Calculation (min) (ACUTE ONLY): 28 min   Charges:   PT Evaluation $Initial PT Evaluation Tier I: 1 Procedure PT Treatments $Gait Training: 8-22 mins   PT G Codes:        [name] 2015-01-23, 2:13 PM [employer] PT [phone]

## 2015-01-12 NOTE — Progress Notes (Signed)
[facility] PROGRESS NOTE  [name] XVQ:[id number] DOB: [birth year] DOA: 01/10/2015  PCP: Lilian Coma., MD  Brief HPI: 81 year old Caucasian male with a past medical history of atrial fibrillation, not on anticoagulation, hypertension, diabetes, chronic kidney disease with a complex orthopedic history involving his right hip with multiple infections. Presented after MRI of his spine revealed discitis at multiple levels. He was hospitalized for further management.  Past medical history:  Past Medical History  Diagnosis Date  . Atrial fibrillation ([place])   . Hypertension   . Pacemaker   . Arthritis   . Anxiety   . Depression   . MSSA (methicillin susceptible Staphylococcus aureus) infection   . Bacteremia   . 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 Septic hip ([place])   . Complication of anesthesia     " Terrible Nightmares"  . Dysrhythmia     Afib- had pacemaker, had it removed- takes Aspirin 325- daily  . Diabetes mellitus without complication ([place])     Type II  . Constipation   . History of blood product transfusion   . OAB (overactive bladder)   . CKD (chronic kidney disease)   . Hypothyroidism   . Thoracic spine pain 11/28/2014    Consultants: Infectious disease. Interventional radiology  Procedures: disc aspiration by interventional radiology 12/16  Antibiotics: Ancef 12/16  Subjective: Patient feels well. Denies any complaints. He is sitting up on the chair having breakfast. No back pain.   Objective: Vital Signs  Filed Vitals:   01/11/15 1832 01/11/15 2058 01/12/15 0404 01/12/15 0527  BP: 142/68 153/67  150/83  Pulse: 66 89  82  Temp:  97.7 F (36.5 C)  99.7 F (37.6 C)  TempSrc:  Oral  Oral  Resp:  20  20  Height:      Weight:   107.1 kg (236 lb 1.8 oz)   SpO2: 99% 98%  95%    Intake/Output Summary (Last 24 hours) at 01/12/15 1002 Last data filed at 01/12/15 0932  Gross per 24 hour  Intake   1290 ml  Output    750 ml  Net    540 ml   Filed  Weights   01/10/15 2052 01/11/15 0453 01/12/15 0404  Weight: 107.7 kg (237 lb 7 oz) 109.6 kg (241 lb 10 oz) 107.1 kg (236 lb 1.8 oz)    General appearance: alert, cooperative, appears stated age and no distress Resp: clear to auscultation bilaterally Cardio: regular rate and rhythm, S1, S2 normal, no murmur, click, rub or gallop GI: soft, non-tender; bowel sounds normal; no masses,  no organomegaly Neurologic: Alert and oriented 3. No focal neurological deficits.  Lab Results:  Basic Metabolic Panel:  Recent Labs Lab 01/10/15 2000 01/11/15 0540 01/12/15 0448  NA 140 139 138  K 4.2 4.2 3.9  CL 109 109 108  CO2 '25 25 25  ' GLUCOSE 93 88 84  BUN '12 11 9  ' CREATININE 1.27* 1.27* 1.20  CALCIUM 8.9 8.7* 8.5*   Liver Function Tests:  Recent Labs Lab 01/11/15 0540  AST 36  ALT 28  ALKPHOS 120  BILITOT 1.2  PROT 5.7*  ALBUMIN 2.8*   CBC:  Recent Labs Lab 01/10/15 0640 01/11/15 0540 01/12/15 0448  WBC 6.4 5.9 5.7  HGB 10.2* 10.2* 10.2*  HCT 31.4* 31.2* 31.2*  MCV 94.0 94.8 95.1  PLT 125* 121* 118*   CBG:  Recent Labs Lab 01/10/15 2058 01/11/15 0807 01/11/15 1506 01/11/15 1721 01/11/15 2126  GLUCAP 101* 84 76 130* 84  Recent Results (from the past 240 hour(s))  Culture, blood (Routine X 2) w Reflex to ID Panel     Status: None (Preliminary result)   Collection Time: 01/10/15  4:15 PM  Result Value Ref Range Status   Specimen Description BLOOD RIGHT HAND  Final   Special Requests IN PEDIATRIC BOTTLE 4CC  Final   Culture NO GROWTH < 24 HOURS  Final   Report Status PENDING  Incomplete  Culture, blood (Routine X 2) w Reflex to ID Panel     Status: None (Preliminary result)   Collection Time: 01/10/15  4:30 PM  Result Value Ref Range Status   Specimen Description BLOOD RIGHT HAND  Final   Special Requests IN PEDIATRIC BOTTLE 2CC  Final   Culture NO GROWTH < 24 HOURS  Final   Report Status PENDING  Incomplete      Studies/Results: Mr Thoracic Spine  W Wo Contrast  01/10/2015  ADDENDUM REPORT: 01/10/2015 14:36 ADDENDUM: Study discussed by telephone with Dr. Rhina Brackett DAM on 01/10/2015 at 1416 hours. Electronically Signed   By: Genevie Ann M.D.   On: 01/10/2015 14:36  01/10/2015  CLINICAL DATA:  81 year old male with MSSA bacteremia in 2014 resulting in large right paraspinal abscess, right hip osteomyelitis and septic hip arthroplasty. Those required right hip surgery and removal of pacemaker. Recurrent MSSA infection, status post 6 weeks of IV antibiotics but new thoracic spine and rib pain. Subsequent encounter. Exam under anesthesia. EXAM: MRI THORACIC AND LUMBAR SPINE WITHOUT AND WITH CONTRAST TECHNIQUE: Multiplanar and multiecho pulse sequences of the thoracic and lumbar spine were obtained without and with intravenous contrast. CONTRAST:  59m MULTIHANCE GADOBENATE DIMEGLUMINE 529 MG/ML IV SOLN COMPARISON:  Lumbar spine CT 08/27/2012. Thoracic spine CT 02/13/2012. FINDINGS: MR THORACIC SPINE FINDINGS Limited sagittal imaging of the cervical spine is remarkable for chronic disc and endplate degeneration, but no cervical spine marrow signal changes. Bulky cervical endplate osteophytes, probably resulting in 1 0 more levels of cervical ankylosis. Similar bulky thoracic endplate osteophytes with suspected multilevel thoracic ankylosis. Abnormal fluid signal in the T8-T9 disc and tracking ventral through what appears to be a fracture of the anterior inferior T9 vertebral body corner series 400, image 11). Surrounding marrow edema and abnormal vertebral body enhancement there is trace disc fluid signal with abnormal enhancement also at T8-T9 (series 400, image 11). However there is no T10 superior or T8 inferior T8 endplate edema or enhancement. The T12-L1 level is described in the lumbar section below. No other acute osseous abnormality in the thoracic spine. No thoracic spinal stenosis above T12. Thoracic spinal cord above T12 appears normal. No abnormal  thoracic intradural enhancement. No associated paraspinal abscess. Abnormal signal in the lungs dependently. Given the associated anesthesia for this study favor this represents atelectasis with trace pleural fluid. Dolichoectatic descending thoracic aorta with atherosclerosis, but otherwise appears negative. Trace fluid in the trachea related to intubation for exam under anesthesia. MR LUMBAR SPINE FINDINGS Bulky lumbar endplate osteophytosis with multilevel associated lumbar ankylosis similar to that in the cervical and thoracic spine. Transitional lumbosacral anatomy with a partially lumbarized S1 level (see series 1600, image 10). At the T12-L1 level there is abnormal fluid throughout the disc space, more so anteriorly. There is mild endplate marrow edema and enhancement. Evidence of previous surgery posteriorly at this level. Degenerative multifactorial spinal stenosis with mild spinal cord mass effect (series 1600, image 11). No definite abnormal cord signal here which is just above the conus which terminates at L1-L2.  There is small volume abnormal fluid signal and enhancement in the anterior and left lateral disc space at L4-L5 (series 1700, image 11). No associated endplate enhancement. Small enhancing degenerative Schmorl node at the L5 inferior endplate incidentally noted. No other acute osseous lesion or suspicious marrow changes. Visible sacrum intact. Partially visible bladder distension.  Cholelithiasis. IMPRESSION: 1. Widespread spinal ankylosis, favor related to diffuse idiopathic skeletal hyperostosis. Chronic postoperative changes to the posterior elements of the thoracolumbar junction and throughout the lower lumbar spine. 2. Acute discitis osteomyelitis at T9-T10, T12-L1. Early discitis at T8-T9 and L4-L5. No epidural or paraspinal abscess. 3. Superimposed degenerative spinal stenosis at T12-L1 with spinal cord mass effect but no definite cord signal abnormality. 4. Abnormal lung bases favor due  to atelectasis and trace pleural effusions. Electronically Signed: By: Genevie Ann M.D. On: 01/10/2015 14:07   Mr Lumbar Spine W Wo Contrast  01/10/2015  ADDENDUM REPORT: 01/10/2015 14:36 ADDENDUM: Study discussed by telephone with Dr. Rhina Brackett DAM on 01/10/2015 at 1416 hours. Electronically Signed   By: Genevie Ann M.D.   On: 01/10/2015 14:36  01/10/2015  CLINICAL DATA:  81 year old male with MSSA bacteremia in 2014 resulting in large right paraspinal abscess, right hip osteomyelitis and septic hip arthroplasty. Those required right hip surgery and removal of pacemaker. Recurrent MSSA infection, status post 6 weeks of IV antibiotics but new thoracic spine and rib pain. Subsequent encounter. Exam under anesthesia. EXAM: MRI THORACIC AND LUMBAR SPINE WITHOUT AND WITH CONTRAST TECHNIQUE: Multiplanar and multiecho pulse sequences of the thoracic and lumbar spine were obtained without and with intravenous contrast. CONTRAST:  93m MULTIHANCE GADOBENATE DIMEGLUMINE 529 MG/ML IV SOLN COMPARISON:  Lumbar spine CT 08/27/2012. Thoracic spine CT 02/13/2012. FINDINGS: MR THORACIC SPINE FINDINGS Limited sagittal imaging of the cervical spine is remarkable for chronic disc and endplate degeneration, but no cervical spine marrow signal changes. Bulky cervical endplate osteophytes, probably resulting in 1 0 more levels of cervical ankylosis. Similar bulky thoracic endplate osteophytes with suspected multilevel thoracic ankylosis. Abnormal fluid signal in the T8-T9 disc and tracking ventral through what appears to be a fracture of the anterior inferior T9 vertebral body corner series 400, image 11). Surrounding marrow edema and abnormal vertebral body enhancement there is trace disc fluid signal with abnormal enhancement also at T8-T9 (series 400, image 11). However there is no T10 superior or T8 inferior T8 endplate edema or enhancement. The T12-L1 level is described in the lumbar section below. No other acute osseous  abnormality in the thoracic spine. No thoracic spinal stenosis above T12. Thoracic spinal cord above T12 appears normal. No abnormal thoracic intradural enhancement. No associated paraspinal abscess. Abnormal signal in the lungs dependently. Given the associated anesthesia for this study favor this represents atelectasis with trace pleural fluid. Dolichoectatic descending thoracic aorta with atherosclerosis, but otherwise appears negative. Trace fluid in the trachea related to intubation for exam under anesthesia. MR LUMBAR SPINE FINDINGS Bulky lumbar endplate osteophytosis with multilevel associated lumbar ankylosis similar to that in the cervical and thoracic spine. Transitional lumbosacral anatomy with a partially lumbarized S1 level (see series 1600, image 10). At the T12-L1 level there is abnormal fluid throughout the disc space, more so anteriorly. There is mild endplate marrow edema and enhancement. Evidence of previous surgery posteriorly at this level. Degenerative multifactorial spinal stenosis with mild spinal cord mass effect (series 1600, image 11). No definite abnormal cord signal here which is just above the conus which terminates at L1-L2. There is small volume abnormal  fluid signal and enhancement in the anterior and left lateral disc space at L4-L5 (series 1700, image 11). No associated endplate enhancement. Small enhancing degenerative Schmorl node at the L5 inferior endplate incidentally noted. No other acute osseous lesion or suspicious marrow changes. Visible sacrum intact. Partially visible bladder distension.  Cholelithiasis. IMPRESSION: 1. Widespread spinal ankylosis, favor related to diffuse idiopathic skeletal hyperostosis. Chronic postoperative changes to the posterior elements of the thoracolumbar junction and throughout the lower lumbar spine. 2. Acute discitis osteomyelitis at T9-T10, T12-L1. Early discitis at T8-T9 and L4-L5. No epidural or paraspinal abscess. 3. Superimposed  degenerative spinal stenosis at T12-L1 with spinal cord mass effect but no definite cord signal abnormality. 4. Abnormal lung bases favor due to atelectasis and trace pleural effusions. Electronically Signed: By: Genevie Ann M.D. On: 01/10/2015 14:07   Ir Fluoro Guide Cv Line Right  01/11/2015  INDICATION: Patient with history of discitis/osteomyelitis of thoracolumbar spine; central venous access is requested for fluids and medications. EXAM: ULTRASOUND AND FLUOROSCOPIC GUIDED PICC LINE INSERTION MEDICATIONS: None. CONTRAST:  None FLUOROSCOPY TIME:  48 seconds. COMPLICATIONS: None immediate TECHNIQUE: The procedure, risks, benefits, and alternatives were explained to the patient and informed written consent was obtained. A timeout was performed prior to the initiation of the procedure. The right upper extremity was prepped with chlorhexidine in a sterile fashion, and a sterile drape was applied covering the operative field. Maximum barrier sterile technique with sterile gowns and gloves were used for the procedure. A timeout was performed prior to the initiation of the procedure. Local anesthesia was provided with 1% lidocaine. Under direct ultrasound guidance, the right basilic vein was accessed with a micropuncture kit after the overlying soft tissues were anesthetized with 1% lidocaine. An ultrasound image was saved for documentation purposes. A guidewire was advanced to the level of the superior caval-atrial junction for measurement purposes and the PICC line was cut to length. A peel-away sheath was placed and a 44 cm, 5 [place], dual lumen was inserted to level of the superior caval-atrial junction. A post procedure spot fluoroscopic was obtained. The catheter easily aspirated and flushed and was sutured in place. A dressing was placed. The patient tolerated the procedure well without immediate post procedural complication. FINDINGS: After catheter placement, the tip lies within the superior cavoatrial  junction. The catheter aspirates and flushes normally and is ready for immediate use. IMPRESSION: Successful ultrasound and fluoroscopic guided placement of a right basilic vein approach, 44 cm, 5 French, dual lumen PICC with tip at the superior caval-atrial junction. The PICC line is ready for immediate use. Read by: Rowe Robert, PA-C Electronically Signed   By: Marybelle Killings M.D.   On: 01/11/2015 13:58   Ir US Guide Vasc Access Right  01/11/2015  INDICATION: Patient with history of discitis/osteomyelitis of thoracolumbar spine; central venous access is requested for fluids and medications. EXAM: ULTRASOUND AND FLUOROSCOPIC GUIDED PICC LINE INSERTION MEDICATIONS: None. CONTRAST:  None FLUOROSCOPY TIME:  48 seconds. COMPLICATIONS: None immediate TECHNIQUE: The procedure, risks, benefits, and alternatives were explained to the patient and informed written consent was obtained. A timeout was performed prior to the initiation of the procedure. The right upper extremity was prepped with chlorhexidine in a sterile fashion, and a sterile drape was applied covering the operative field. Maximum barrier sterile technique with sterile gowns and gloves were used for the procedure. A timeout was performed prior to the initiation of the procedure. Local anesthesia was provided with 1% lidocaine. Under direct ultrasound guidance, the  right basilic vein was accessed with a micropuncture kit after the overlying soft tissues were anesthetized with 1% lidocaine. An ultrasound image was saved for documentation purposes. A guidewire was advanced to the level of the superior caval-atrial junction for measurement purposes and the PICC line was cut to length. A peel-away sheath was placed and a 44 cm, 5 [place], dual lumen was inserted to level of the superior caval-atrial junction. A post procedure spot fluoroscopic was obtained. The catheter easily aspirated and flushed and was sutured in place. A dressing was placed. The patient  tolerated the procedure well without immediate post procedural complication. FINDINGS: After catheter placement, the tip lies within the superior cavoatrial junction. The catheter aspirates and flushes normally and is ready for immediate use. IMPRESSION: Successful ultrasound and fluoroscopic guided placement of a right basilic vein approach, 44 cm, 5 French, dual lumen PICC with tip at the superior caval-atrial junction. The PICC line is ready for immediate use. Read by: Rowe Robert, PA-C Electronically Signed   By: Marybelle Killings M.D.   On: 01/11/2015 13:58   Ir Fluoro Guide Ndl Plmt / Bx  01/11/2015  CLINICAL DATA:  T12-L1 discitis EXAM: IR FLUORO GUIDE NEEDLE PLACEMENT /BIOPSY FLUOROSCOPY TIME:  3 minutes and 36 seconds MEDICATIONS AND MEDICAL HISTORY: Versed 2 mg, Fentanyl 100 mcg. ANESTHESIA/SEDATION: Moderate sedation time: 15 minutes CONTRAST:  None PROCEDURE: The procedure, risks, benefits, and alternatives were explained to the patient. Questions regarding the procedure were encouraged and answered. The patient understands and consents to the procedure. The back was prepped with Betadine in a sterile fashion, and a sterile drape was applied covering the operative field. A sterile gown and sterile gloves were used for the procedure. Under fluoroscopic guidance, an 18 gauge needle was inserted into the T12-L1 disc space via left posterior lateral approach. 2 cc serosanguineous fluid was aspirated. FINDINGS: Images document needle placement in the T12-L1 disc space. COMPLICATIONS: None IMPRESSION: Successful T12-L1 disc aspiration. Electronically Signed   By: Marybelle Killings M.D.   On: 01/11/2015 14:59    Medications:  Scheduled: .  ceFAZolin (ANCEF) IV  2 g Intravenous Q8H  . diltiazem  120 mg Oral Daily  . ferrous sulfate  325 mg Oral Q breakfast  . finasteride  5 mg Oral QHS  . insulin aspart  0-15 Units Subcutaneous TID WC  . insulin aspart  0-5 Units Subcutaneous QHS  . lactulose  20 g Oral  Daily  . levothyroxine  88 mcg Oral QAC breakfast  . metoprolol succinate  12.5 mg Oral Daily  . pravastatin  10 mg Oral q1800  . terazosin  5 mg Oral QHS   Continuous: . sodium chloride 50 mL/hr at 01/12/15 0604   TSV:[id number] **OR** acetaminophen, HYDROmorphone (DILAUDID) injection, meperidine (DEMEROL) injection, ondansetron (ZOFRAN) IV, ondansetron **OR** ondansetron (ZOFRAN) IV, sodium chloride  Assessment/Plan:  Principal Problem:   Discitis of thoracolumbar region Active Problems:   HTN (hypertension)   Anemia of chronic disease   CKD (chronic kidney disease), stage III   Chronic atrial fibrillation (HCC)   Hypothyroidism   HLD (hyperlipidemia)   BPH (benign prostatic hyperplasia)   DM type 2 (diabetes mellitus, type 2) (HCC)   Osteomyelitis of thoracic spine (HCC)    Discitis of thoracolumbar region/ Osteomyelitis of thoracic spine Patient underwent aspiration of fluid from his spine area yesterday. Cultures are pending. Patient being followed by infectious disease. He has been started on intravenous Ancef. PICC line has been placed.   Essential HTN  Continue Cardizem and metoprolol. Continue to monitor closely.  CKD (chronic kidney disease), stage III Renal function stable at baseline  Chronic atrial fibrillation  Rate controlled. Not anticoagulated secondary to prior retroperitoneal hematoma and setting a supratherapeutic INR. Continue beta blocker and calcium channel blocker as above. CHADVASc = 4  DM type 2  Continue sliding scale coverage. HbA1c was 6.2 in July.   Anemia of chronic disease Hemoglobin stable and at baseline  Hypothyroidism Continue Synthroid   HLD  Continue Mevacor  BPH  Continue Hytrin  DVT Prophylaxis: SCDs    Code Status: Full code  Family Communication: Discussed in detail with the patient  Disposition Plan: Await culture data. Discharge when cleared by infectious disease. He will need long-term IV antibiotics.     LOS: 2 days   [facility] Pager [phone] 01/12/2015, 10:02 AM  If 7PM-7AM, please contact night-coverage at [email or web address], password [facility]

## 2015-01-13 LAB — GLUCOSE, CAPILLARY
GLUCOSE-CAPILLARY: 82 mg/dL (ref 65–99)
GLUCOSE-CAPILLARY: 117 mg/dL — AB (ref 65–99)
GLUCOSE-CAPILLARY: 113 mg/dL — AB (ref 65–99)
GLUCOSE-CAPILLARY: 124 mg/dL — AB (ref 65–99)

## 2015-01-13 NOTE — Progress Notes (Signed)
[facility] PROGRESS NOTE  [name] HQP:[id number] DOB: 10-24-1930 DOA: 01/10/2015  PCP: Lilian Coma., MD  Brief HPI: 81 year old Caucasian male with a past medical history of atrial fibrillation, not on anticoagulation, hypertension, diabetes, chronic kidney disease with a complex orthopedic history involving his right hip with multiple infections. Presented after MRI of his spine revealed discitis at multiple levels. He was hospitalized for further management.  Past medical history:  Past Medical History  Diagnosis Date  . Atrial fibrillation ([place])   . Hypertension   . Pacemaker   . Arthritis   . Anxiety   . Depression   . MSSA (methicillin susceptible Staphylococcus aureus) infection   . Bacteremia   . 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 Septic hip ([place])   . Complication of anesthesia     " Terrible Nightmares"  . Dysrhythmia     Afib- had pacemaker, had it removed- takes Aspirin 325- daily  . Diabetes mellitus without complication ([place])     Type II  . Constipation   . History of blood product transfusion   . OAB (overactive bladder)   . CKD (chronic kidney disease)   . Hypothyroidism   . Thoracic spine pain 11/28/2014    Consultants: Infectious disease. Interventional radiology  Procedures: disc aspiration by interventional radiology 12/16  Antibiotics: Ancef 12/16  Subjective: Patient can use to feel well. Denies any complaints. Minimal back pain at times.   Objective: Vital Signs  Filed Vitals:   01/12/15 1510 01/12/15 2210 01/13/15 0051 01/13/15 0506  BP: 155/68 143/72 143/64 135/70  Pulse: 89 80 83 78  Temp: 98.6 F (37 C) 100.1 F (37.8 C) 98.4 F (36.9 C) 98.2 F (36.8 C)  TempSrc: Oral Oral Oral Oral  Resp: '19  18 18  ' Height:      Weight:    107.9 kg (237 lb 14 oz)  SpO2: 96% 99% 97% 97%    Intake/Output Summary (Last 24 hours) at 01/13/15 1022 Last data filed at 01/13/15 0752  Gross per 24 hour  Intake    610 ml  Output   1150  ml  Net   -540 ml   Filed Weights   01/11/15 0453 01/12/15 0404 01/13/15 0506  Weight: 109.6 kg (241 lb 10 oz) 107.1 kg (236 lb 1.8 oz) 107.9 kg (237 lb 14 oz)    General appearance: alert, cooperative,  Resp: clear to auscultation bilaterally Cardio: regular rate and rhythm, S1, S2 normal, no murmur, click, rub or gallop GI: soft, non-tender; bowel sounds normal; no masses,  no organomegaly Neurologic: Alert and oriented 3. No focal neurological deficits.  Lab Results:  Basic Metabolic Panel:  Recent Labs Lab 01/10/15 2000 01/11/15 0540 01/12/15 0448  NA 140 139 138  K 4.2 4.2 3.9  CL 109 109 108  CO2 '25 25 25  ' GLUCOSE 93 88 84  BUN '12 11 9  ' CREATININE 1.27* 1.27* 1.20  CALCIUM 8.9 8.7* 8.5*   Liver Function Tests:  Recent Labs Lab 01/11/15 0540  AST 36  ALT 28  ALKPHOS 120  BILITOT 1.2  PROT 5.7*  ALBUMIN 2.8*   CBC:  Recent Labs Lab 01/10/15 0640 01/11/15 0540 01/12/15 0448  WBC 6.4 5.9 5.7  HGB 10.2* 10.2* 10.2*  HCT 31.4* 31.2* 31.2*  MCV 94.0 94.8 95.1  PLT 125* 121* 118*   CBG:  Recent Labs Lab 01/12/15 0742 01/12/15 1149 01/12/15 1641 01/12/15 2226 01/13/15 0754  GLUCAP 91 159* 95 98 82    Recent  Results (from the past 240 hour(s))  Culture, blood (Routine X 2) w Reflex to ID Panel     Status: None (Preliminary result)   Collection Time: 01/10/15  4:15 PM  Result Value Ref Range Status   Specimen Description BLOOD RIGHT HAND  Final   Special Requests IN PEDIATRIC BOTTLE 4CC  Final   Culture NO GROWTH 3 DAYS  Final   Report Status PENDING  Incomplete  Culture, blood (Routine X 2) w Reflex to ID Panel     Status: None (Preliminary result)   Collection Time: 01/10/15  4:30 PM  Result Value Ref Range Status   Specimen Description BLOOD RIGHT HAND  Final   Special Requests IN PEDIATRIC BOTTLE 2CC  Final   Culture NO GROWTH 3 DAYS  Final   Report Status PENDING  Incomplete  Wound culture     Status: None (Preliminary result)    Collection Time: 01/11/15  2:36 PM  Result Value Ref Range Status   Specimen Description WOUND BACK  Final   Special Requests INTERVERTEBRAL DISC  Final   Gram Stain PENDING  Incomplete   Culture   Final    NO GROWTH 1 DAY Performed at [facility]    Report Status PENDING  Incomplete      Studies/Results: Ir Fluoro Guide Cv Line Right  01/11/2015  INDICATION: Patient with history of discitis/osteomyelitis of thoracolumbar spine; central venous access is requested for fluids and medications. EXAM: ULTRASOUND AND FLUOROSCOPIC GUIDED PICC LINE INSERTION MEDICATIONS: None. CONTRAST:  None FLUOROSCOPY TIME:  48 seconds. COMPLICATIONS: None immediate TECHNIQUE: The procedure, risks, benefits, and alternatives were explained to the patient and informed written consent was obtained. A timeout was performed prior to the initiation of the procedure. The right upper extremity was prepped with chlorhexidine in a sterile fashion, and a sterile drape was applied covering the operative field. Maximum barrier sterile technique with sterile gowns and gloves were used for the procedure. A timeout was performed prior to the initiation of the procedure. Local anesthesia was provided with 1% lidocaine. Under direct ultrasound guidance, the right basilic vein was accessed with a micropuncture kit after the overlying soft tissues were anesthetized with 1% lidocaine. An ultrasound image was saved for documentation purposes. A guidewire was advanced to the level of the superior caval-atrial junction for measurement purposes and the PICC line was cut to length. A peel-away sheath was placed and a 44 cm, 5 [place], dual lumen was inserted to level of the superior caval-atrial junction. A post procedure spot fluoroscopic was obtained. The catheter easily aspirated and flushed and was sutured in place. A dressing was placed. The patient tolerated the procedure well without immediate post procedural complication.  FINDINGS: After catheter placement, the tip lies within the superior cavoatrial junction. The catheter aspirates and flushes normally and is ready for immediate use. IMPRESSION: Successful ultrasound and fluoroscopic guided placement of a right basilic vein approach, 44 cm, 5 French, dual lumen PICC with tip at the superior caval-atrial junction. The PICC line is ready for immediate use. Read by: Rowe Robert, PA-C Electronically Signed   By: Marybelle Killings M.D.   On: 01/11/2015 13:58   Ir US Guide Vasc Access Right  01/11/2015  INDICATION: Patient with history of discitis/osteomyelitis of thoracolumbar spine; central venous access is requested for fluids and medications. EXAM: ULTRASOUND AND FLUOROSCOPIC GUIDED PICC LINE INSERTION MEDICATIONS: None. CONTRAST:  None FLUOROSCOPY TIME:  48 seconds. COMPLICATIONS: None immediate TECHNIQUE: The procedure, risks, benefits, and alternatives  were explained to the patient and informed written consent was obtained. A timeout was performed prior to the initiation of the procedure. The right upper extremity was prepped with chlorhexidine in a sterile fashion, and a sterile drape was applied covering the operative field. Maximum barrier sterile technique with sterile gowns and gloves were used for the procedure. A timeout was performed prior to the initiation of the procedure. Local anesthesia was provided with 1% lidocaine. Under direct ultrasound guidance, the right basilic vein was accessed with a micropuncture kit after the overlying soft tissues were anesthetized with 1% lidocaine. An ultrasound image was saved for documentation purposes. A guidewire was advanced to the level of the superior caval-atrial junction for measurement purposes and the PICC line was cut to length. A peel-away sheath was placed and a 44 cm, 5 [place], dual lumen was inserted to level of the superior caval-atrial junction. A post procedure spot fluoroscopic was obtained. The catheter easily  aspirated and flushed and was sutured in place. A dressing was placed. The patient tolerated the procedure well without immediate post procedural complication. FINDINGS: After catheter placement, the tip lies within the superior cavoatrial junction. The catheter aspirates and flushes normally and is ready for immediate use. IMPRESSION: Successful ultrasound and fluoroscopic guided placement of a right basilic vein approach, 44 cm, 5 French, dual lumen PICC with tip at the superior caval-atrial junction. The PICC line is ready for immediate use. Read by: Rowe Robert, PA-C Electronically Signed   By: Marybelle Killings M.D.   On: 01/11/2015 13:58   Ir Fluoro Guide Ndl Plmt / Bx  01/11/2015  CLINICAL DATA:  T12-L1 discitis EXAM: IR FLUORO GUIDE NEEDLE PLACEMENT /BIOPSY FLUOROSCOPY TIME:  3 minutes and 36 seconds MEDICATIONS AND MEDICAL HISTORY: Versed 2 mg, Fentanyl 100 mcg. ANESTHESIA/SEDATION: Moderate sedation time: 15 minutes CONTRAST:  None PROCEDURE: The procedure, risks, benefits, and alternatives were explained to the patient. Questions regarding the procedure were encouraged and answered. The patient understands and consents to the procedure. The back was prepped with Betadine in a sterile fashion, and a sterile drape was applied covering the operative field. A sterile gown and sterile gloves were used for the procedure. Under fluoroscopic guidance, an 18 gauge needle was inserted into the T12-L1 disc space via left posterior lateral approach. 2 cc serosanguineous fluid was aspirated. FINDINGS: Images document needle placement in the T12-L1 disc space. COMPLICATIONS: None IMPRESSION: Successful T12-L1 disc aspiration. Electronically Signed   By: Marybelle Killings M.D.   On: 01/11/2015 14:59    Medications:  Scheduled: .  ceFAZolin (ANCEF) IV  2 g Intravenous Q8H  . diltiazem  120 mg Oral Daily  . ferrous sulfate  325 mg Oral Q breakfast  . finasteride  5 mg Oral QHS  . insulin aspart  0-15 Units  Subcutaneous TID WC  . insulin aspart  0-5 Units Subcutaneous QHS  . lactulose  20 g Oral Daily  . levothyroxine  88 mcg Oral QAC breakfast  . metoprolol succinate  12.5 mg Oral Daily  . pravastatin  10 mg Oral q1800  . terazosin  5 mg Oral QHS   Continuous:   FMB:[id number] **OR** acetaminophen, HYDROmorphone (DILAUDID) injection, meperidine (DEMEROL) injection, ondansetron (ZOFRAN) IV, ondansetron **OR** ondansetron (ZOFRAN) IV, sodium chloride  Assessment/Plan:  Principal Problem:   Discitis of thoracolumbar region Active Problems:   HTN (hypertension)   Anemia of chronic disease   CKD (chronic kidney disease), stage III   Chronic atrial fibrillation (HCC)   Hypothyroidism  HLD (hyperlipidemia)   BPH (benign prostatic hyperplasia)   DM type 2 (diabetes mellitus, type 2) ([place])   Osteomyelitis of thoracic spine (HCC)    Discitis of thoracolumbar region/ Osteomyelitis of thoracic spine Patient underwent aspiration of fluid from his spine area 12/16. Cultures are pending. Patient being followed by infectious disease. He has been started on intravenous Ancef. PICC line has been placed.   Essential HTN  Continue Cardizem and metoprolol. Continue to monitor closely.  CKD (chronic kidney disease), stage III Renal function stable at baseline  Chronic atrial fibrillation  Rate controlled. Not anticoagulated secondary to prior retroperitoneal hematoma and setting a supratherapeutic INR. Continue beta blocker and calcium channel blocker as above. CHADVASc = 4  DM type 2  Continue sliding scale coverage. HbA1c was 6.2 in July.   Anemia of chronic disease Hemoglobin stable and at baseline  Hypothyroidism Continue Synthroid   HLD  Continue Mevacor  BPH  Continue Hytrin  DVT Prophylaxis: SCDs    Code Status: Full code  Family Communication: Discussed in detail with the patient  Disposition Plan: Await culture data. Discharge when cleared by infectious disease. He  will need long-term IV antibiotics. Mobilize as tolerated.    LOS: 3 days   [facility] Pager [phone] 01/13/2015, 10:22 AM  If 7PM-7AM, please contact night-coverage at [email or web address], password [facility]

## 2015-01-13 NOTE — Progress Notes (Signed)
[name] for [facility]    Date of Admission:  01/10/2015   Total days of antibiotics 3        Day 3 cefazolin           ID: [name] is a 81 y.o. male with  thoracolumbar discitis/osteomyelitis s/p IR aspiration.Principal Problem:   Discitis of thoracolumbar region Active Problems:   HTN (hypertension)   Anemia of chronic disease   CKD (chronic kidney disease), stage III   Chronic atrial fibrillation (HCC)   Hypothyroidism   HLD (hyperlipidemia)   BPH (benign prostatic hyperplasia)   DM type 2 (diabetes mellitus, type 2) (HCC)   Osteomyelitis of thoracic spine (HCC)    Subjective: Afebrile. tmax of 100.1, no back pain while sittin in chair  Medications:  .  ceFAZolin (ANCEF) IV  2 g Intravenous Q8H  . diltiazem  120 mg Oral Daily  . ferrous sulfate  325 mg Oral Q breakfast  . finasteride  5 mg Oral QHS  . insulin aspart  0-15 Units Subcutaneous TID WC  . insulin aspart  0-5 Units Subcutaneous QHS  . lactulose  20 g Oral Daily  . levothyroxine  88 mcg Oral QAC breakfast  . metoprolol succinate  12.5 mg Oral Daily  . pravastatin  10 mg Oral q1800  . terazosin  5 mg Oral QHS    Objective: Vital signs in last 24 hours: Temp:  [98.2 F (36.8 C)-100.1 F (37.8 C)] 98.7 F (37.1 C) (12/18 1253) Pulse Rate:  [66-89] 66 (12/18 1253) Resp:  [18-19] 18 (12/18 1253) BP: (135-155)/(64-81) 140/81 mmHg (12/18 1253) SpO2:  [96 %-99 %] 98 % (12/18 1253) Weight:  [237 lb 14 oz (107.9 kg)] 237 lb 14 oz (107.9 kg) (12/18 0506)  gen = a xo by 3 in nad heent = op clear MMM Lungs = cTAB on w/c/r Abd= soft,ntnd,BS+ Ext =trace edema bilaterally  Lab Results  Recent Labs  01/11/15 0540 01/12/15 0448  WBC 5.9 5.7  HGB 10.2* 10.2*  HCT 31.2* 31.2*  NA 139 138  K 4.2 3.9  CL 109 108  CO2 25 25  BUN 11 9  CREATININE 1.27* 1.20   Liver Panel  Recent Labs  01/11/15 0540  PROT 5.7*  ALBUMIN 2.8*  AST 36  ALT 28  ALKPHOS 120  BILITOT 1.2    C-Reactive Protein  Recent Labs  01/10/15 1615  CRP 1.8*    Microbiology: 12/15 blood cx ngtd 12/16 wound cx ngtd Studies/Results: Ir Fluoro Guide Cv Line Right  01/11/2015  INDICATION: Patient with history of discitis/osteomyelitis of thoracolumbar spine; central venous access is requested for fluids and medications. EXAM: ULTRASOUND AND FLUOROSCOPIC GUIDED PICC LINE INSERTION MEDICATIONS: None. CONTRAST:  None FLUOROSCOPY TIME:  48 seconds. COMPLICATIONS: None immediate TECHNIQUE: The procedure, risks, benefits, and alternatives were explained to the patient and informed written consent was obtained. A timeout was performed prior to the initiation of the procedure. The right upper extremity was prepped with chlorhexidine in a sterile fashion, and a sterile drape was applied covering the operative field. Maximum barrier sterile technique with sterile gowns and gloves were used for the procedure. A timeout was performed prior to the initiation of the procedure. Local anesthesia was provided with 1% lidocaine. Under direct ultrasound guidance, the right basilic vein was accessed with a micropuncture kit after the overlying soft tissues were anesthetized with 1% lidocaine. An ultrasound image was saved for documentation purposes. A guidewire was advanced to the  level of the superior caval-atrial junction for measurement purposes and the PICC line was cut to length. A peel-away sheath was placed and a 44 cm, 5 [place], dual lumen was inserted to level of the superior caval-atrial junction. A post procedure spot fluoroscopic was obtained. The catheter easily aspirated and flushed and was sutured in place. A dressing was placed. The patient tolerated the procedure well without immediate post procedural complication. FINDINGS: After catheter placement, the tip lies within the superior cavoatrial junction. The catheter aspirates and flushes normally and is ready for immediate use. IMPRESSION: Successful  ultrasound and fluoroscopic guided placement of a right basilic vein approach, 44 cm, 5 French, dual lumen PICC with tip at the superior caval-atrial junction. The PICC line is ready for immediate use. Read by: Joshua Robert, PA-C Electronically Signed   By: [name] M.D.   On: 01/11/2015 13:58   Ir US Guide Vasc Access Right  01/11/2015  INDICATION: Patient with history of discitis/osteomyelitis of thoracolumbar spine; central venous access is requested for fluids and medications. EXAM: ULTRASOUND AND FLUOROSCOPIC GUIDED PICC LINE INSERTION MEDICATIONS: None. CONTRAST:  None FLUOROSCOPY TIME:  48 seconds. COMPLICATIONS: None immediate TECHNIQUE: The procedure, risks, benefits, and alternatives were explained to the patient and informed written consent was obtained. A timeout was performed prior to the initiation of the procedure. The right upper extremity was prepped with chlorhexidine in a sterile fashion, and a sterile drape was applied covering the operative field. Maximum barrier sterile technique with sterile gowns and gloves were used for the procedure. A timeout was performed prior to the initiation of the procedure. Local anesthesia was provided with 1% lidocaine. Under direct ultrasound guidance, the right basilic vein was accessed with a micropuncture kit after the overlying soft tissues were anesthetized with 1% lidocaine. An ultrasound image was saved for documentation purposes. A guidewire was advanced to the level of the superior caval-atrial junction for measurement purposes and the PICC line was cut to length. A peel-away sheath was placed and a 44 cm, 5 [place], dual lumen was inserted to level of the superior caval-atrial junction. A post procedure spot fluoroscopic was obtained. The catheter easily aspirated and flushed and was sutured in place. A dressing was placed. The patient tolerated the procedure well without immediate post procedural complication. FINDINGS: After catheter placement,  the tip lies within the superior cavoatrial junction. The catheter aspirates and flushes normally and is ready for immediate use. IMPRESSION: Successful ultrasound and fluoroscopic guided placement of a right basilic vein approach, 44 cm, 5 French, dual lumen PICC with tip at the superior caval-atrial junction. The PICC line is ready for immediate use. Read by: Joshua Robert, PA-C Electronically Signed   By: [name] M.D.   On: 01/11/2015 13:58   Ir Fluoro Guide Ndl Plmt / Bx  01/11/2015  CLINICAL DATA:  T12-L1 discitis EXAM: IR FLUORO GUIDE NEEDLE PLACEMENT /BIOPSY FLUOROSCOPY TIME:  3 minutes and 36 seconds MEDICATIONS AND MEDICAL HISTORY: Versed 2 mg, Fentanyl 100 mcg. ANESTHESIA/SEDATION: Moderate sedation time: 15 minutes CONTRAST:  None PROCEDURE: The procedure, risks, benefits, and alternatives were explained to the patient. Questions regarding the procedure were encouraged and answered. The patient understands and consents to the procedure. The back was prepped with Betadine in a sterile fashion, and a sterile drape was applied covering the operative field. A sterile gown and sterile gloves were used for the procedure. Under fluoroscopic guidance, an 18 gauge needle was inserted into the T12-L1 disc space via left posterior  lateral approach. 2 cc serosanguineous fluid was aspirated. FINDINGS: Images document needle placement in the T12-L1 disc space. COMPLICATIONS: None IMPRESSION: Successful T12-L1 disc aspiration. Electronically Signed   By: [name] M.D.   On: 01/11/2015 14:59     Assessment/Plan: Culture negative osteomyelitis = previously MSSA which is often difficulty to treat. His aspirate was done on 12/16 and at 48hr appear to have NGTD. Currently back on cefazolin 2gm IV Q8hr. Will plan to treat for another 6 wk of IV therapy using 12/16 as day 1 of 42. Will watch cultures til tomorrow to see if need to change regimen, may consider adding vanco.    ------------------home health  IV abtx orders---------------------  Diagnosis: Thoracolumbar discitis  Culture Result: no growth, though hx of mssa  Allergies  Allergen Reactions  . Ambien [Zolpidem] Other (See Comments)    hallucinations    Discharge antibiotics: Per pharmacy protocol  cefazolin Duration:  6 wks End Date: Jan 27th  [place] Per Protocol: Labs weekly while on IV antibiotics: _x CBC with differential x CMP x_ CRP  -- only on last wk of abtx January 24th  _x_ ESR  --- only on last wk of abtx January 24th __ Vancomycin trough  Fax weekly labs to [phone]  Clinic Follow Up Appt: with dr. Lucianne Lei dam or hatcher      [place] Cell: [phone] Pager: [phone]  01/13/2015, 1:19 PM

## 2015-01-14 DIAGNOSIS — E118 Type 2 diabetes mellitus with unspecified complications: Secondary | ICD-10-CM | POA: Insufficient documentation

## 2015-01-14 DIAGNOSIS — Z9889 Other specified postprocedural states: Secondary | ICD-10-CM

## 2015-01-14 DIAGNOSIS — M4645 Discitis, unspecified, thoracolumbar region: Principal | ICD-10-CM

## 2015-01-14 LAB — BASIC METABOLIC PANEL
Anion gap: 7 (ref 5–15)
BUN: 9 mg/dL (ref 6–20)
CHLORIDE: 106 mmol/L (ref 101–111)
CO2: 26 mmol/L (ref 22–32)
Calcium: 8.7 mg/dL — ABNORMAL LOW (ref 8.9–10.3)
Creatinine, Ser: 1.05 mg/dL (ref 0.61–1.24)
GFR calc Af Amer: >60 mL/min (ref >60)
GFR calc non Af Amer: >60 mL/min (ref >60)
GLUCOSE: 93 mg/dL (ref 65–99)
POTASSIUM: 3.4 mmol/L — AB (ref 3.5–5.1)
Sodium: 139 mmol/L (ref 135–145)

## 2015-01-14 LAB — CBC
HCT: 31.4 % — ABNORMAL LOW (ref 39.0–52.0)
HEMOGLOBIN: 10 g/dL — AB (ref 13.0–17.0)
MCH: 29.9 pg (ref 26.0–34.0)
MCHC: 31.8 g/dL (ref 30.0–36.0)
MCV: 94 fL (ref 78.0–100.0)
Platelets: 126 10*3/uL — ABNORMAL LOW (ref 150–400)
RBC: 3.34 MIL/uL — AB (ref 4.22–5.81)
RDW: 15.2 % (ref 11.5–15.5)
WBC: 5.6 10*3/uL (ref 4.0–10.5)

## 2015-01-14 LAB — WOUND CULTURE
CULTURE: NO GROWTH
Gram Stain: NONE SEEN

## 2015-01-14 LAB — GLUCOSE, CAPILLARY
Glucose-Capillary: 100 mg/dL — ABNORMAL HIGH (ref 65–99)
Glucose-Capillary: 114 mg/dL — ABNORMAL HIGH (ref 65–99)
Glucose-Capillary: 96 mg/dL (ref 65–99)

## 2015-01-14 MED ORDER — POTASSIUM CHLORIDE CRYS ER 20 MEQ PO TBCR
40.0000 meq | EXTENDED_RELEASE_TABLET | Freq: Once | ORAL | Status: AC
  Administered 2015-01-14: 40 meq via ORAL
  Filled 2015-01-14: qty 2

## 2015-01-14 MED ORDER — HEPARIN SOD (PORK) LOCK FLUSH 100 UNIT/ML IV SOLN
250.0000 [IU] | INTRAVENOUS | Status: DC | PRN
  Administered 2015-01-14: 250 [IU]
  Filled 2015-01-14 (×2): qty 3

## 2015-01-14 MED ORDER — CEFAZOLIN SODIUM-DEXTROSE 2-3 GM-% IV SOLR: 2.0000 g | Freq: Three times a day (TID) | INTRAVENOUS | Status: DC

## 2015-01-14 MED ORDER — HEPARIN SOD (PORK) LOCK FLUSH 100 UNIT/ML IV SOLN
250.0000 [IU] | Freq: Every day | INTRAVENOUS | Status: DC
  Filled 2015-01-14: qty 3

## 2015-01-14 NOTE — Progress Notes (Signed)
Occupational Therapy Treatment Patient Details Name: Joshua BrookeHarda [name] MRN: [id number] DOB: 1930-07-13 Today's Date: 01/14/2015    History of present illness 81 year old Caucasian male with a past medical history of atrial fibrillation, not on anticoagulation, hypertension, diabetes, chronic kidney disease with a complex orthopedic history involving his right hip with multiple infections. Presented after MRI of his spine revealed discitis at multiple levels. He was hospitalized for further management. Discitis osteomyelitis T9-10, T12-L1   OT comments  Pt. Progressing well with acute OT goals.  Focus this session: bed mobility,  Amb. To/from b.room for toileting, and LB dressing.  Will continue to follow acutely.    Follow Up Recommendations  No OT follow up;Supervision - Intermittent    Equipment Recommendations  Other (comment)    Recommendations for Other Services      Precautions / Restrictions Precautions Precautions: Fall       Mobility Bed Mobility Overal bed mobility: Needs Assistance Bed Mobility: Rolling;Sidelying to Sit Rolling: Supervision Sidelying to sit: Min assist       General bed mobility comments: hob flat, no rails, exited from right side of bed-cues for incorporating log roll technique  Transfers Overall transfer level: Needs assistance Equipment used: Rolling walker (2 wheeled) Transfers: Sit to/from UGI CorporationStand;Stand Pivot Transfers Sit to Stand: Min guard Stand pivot transfers: Min guard       General transfer comment: cues for ue placement during sit<>stand    Balance     Sitting balance-Leahy Scale: Good       Standing balance-Leahy Scale: Poor                     ADL Overall ADL's : Needs assistance/impaired                     Lower Body Dressing: Minimal assistance;Sit to/from stand   Toilet Transfer: Ambulation;Minimal assistance;RW   Toileting- Clothing Manipulation and Hygiene: Supervision/safety;Sitting/lateral  lean       Functional mobility during ADLs: Minimal assistance;Rolling walker General ADL Comments: requires built up sneaker for R LE, states he can not ambulate well without it      Vision                     Perception     Praxis      Cognition   Behavior During Therapy: WFL for tasks assessed/performed Overall Cognitive Status: Within Functional Limits for tasks assessed                       Extremity/Trunk Assessment               Exercises     Shoulder Instructions       General Comments      Pertinent Vitals/ Pain       Pain Assessment:  (did not rate but states "back aches a little") Pain Location: back Pain Descriptors / Indicators: Aching Pain Intervention(s): Repositioned  Home Living                                          Prior Functioning/Environment              Frequency Min 2X/week     Progress Toward Goals  OT Goals(current goals can now be found in the care plan section)  Progress towards OT goals: Progressing toward goals  Plan Discharge plan remains appropriate    Co-evaluation                 End of Session Equipment Utilized During Treatment: Gait belt;Rolling walker;Other (comment) (sneaker with built up right sneaker)   Activity Tolerance Patient tolerated treatment well   Patient Left in chair;with call bell/phone within reach;with chair alarm set   Nurse Communication          Time: [phone] OT Time Calculation (min): 25 min  Charges: OT General Charges $OT Visit: 1 Procedure OT Treatments $Self Care/Home Management : 23-37 mins  Robet Leu, COTA/L 01/14/2015, 8:27 AM

## 2015-01-14 NOTE — Discharge Summary (Signed)
[facility]  Physician Discharge Summary   Patient ID: [name] MRN: [id number] DOB/AGE: 1930-02-22 81 y.o.  Admit date: 01/10/2015 Discharge date: 01/14/2015  PCP: Lilian Coma., MD  DISCHARGE DIAGNOSES:  Principal Problem:   Discitis of thoracolumbar region Active Problems:   HTN (hypertension)   Anemia of chronic disease   CKD (chronic kidney disease), stage III   Chronic atrial fibrillation (HCC)   Hypothyroidism   HLD (hyperlipidemia)   BPH (benign prostatic hyperplasia)   DM type 2 (diabetes mellitus, type 2) (HCC)   Osteomyelitis of thoracic spine (HCC)   RECOMMENDATIONS FOR OUTPATIENT FOLLOW UP: 1. Home health will be arranged for PT, as well as IV antibiotics   DISCHARGE CONDITION: fair  Diet recommendation: Modified, hydrate  Filed Weights   01/11/15 0453 01/12/15 0404 01/13/15 0506  Weight: 109.6 kg (241 lb 10 oz) 107.1 kg (236 lb 1.8 oz) 107.9 kg (237 lb 14 oz)    INITIAL HISTORY: 81 year old Caucasian male with a past medical history of atrial fibrillation, not on anticoagulation, hypertension, diabetes, chronic kidney disease with a complex orthopedic history involving his right hip with multiple infections. Presented after MRI of his spine revealed discitis at multiple levels. He was hospitalized for further management.  Consultants: Infectious disease. Interventional radiology  Procedures: disc aspiration by interventional radiology 12/16  HOSPITAL COURSE:   Discitis of thoracolumbar region/ Osteomyelitis of thoracic spine Patient underwent aspiration of fluid from his spine area 12/16. Cultures are negative. Patient being followed by infectious disease. Patient was on Keflex at home, which was stopped at the time of admission. He was started on intravenous Ancef. Plan is for 6 weeks of treatment. PICC line has been placed. Discussed with Dr. Tommy Medal today. Okay for discharge.   Essential HTN  Continue Cardizem and  metoprolol.  CKD (chronic kidney disease), stage III Renal function stable at baseline  Chronic atrial fibrillation  Rate controlled. Not anticoagulated secondary to prior retroperitoneal hematoma and setting a supratherapeutic INR. Continue beta blocker and calcium channel blocker as above. CHADVASc = 4  DM type 2  Continue sliding scale coverage. HbA1c was 6.2 in July.   Anemia of chronic disease Hemoglobin stable and at baseline  Hypothyroidism Continue Synthroid   HLD  Continue Mevacor  BPH  Continue Hytrin  Overall, stable. Patient was seen by physical therapy. Home health will be ordered. Okay for discharge today.   PERTINENT LABS:  The results of significant diagnostics from this hospitalization (including imaging, microbiology, ancillary and laboratory) are listed below for reference.    Microbiology: Recent Results (from the past 240 hour(s))  Culture, blood (Routine X 2) w Reflex to ID Panel     Status: None (Preliminary result)   Collection Time: 01/10/15  4:15 PM  Result Value Ref Range Status   Specimen Description BLOOD RIGHT HAND  Final   Special Requests IN PEDIATRIC BOTTLE 4CC  Final   Culture NO GROWTH 4 DAYS  Final   Report Status PENDING  Incomplete  Culture, blood (Routine X 2) w Reflex to ID Panel     Status: None (Preliminary result)   Collection Time: 01/10/15  4:30 PM  Result Value Ref Range Status   Specimen Description BLOOD RIGHT HAND  Final   Special Requests IN PEDIATRIC BOTTLE 2CC  Final   Culture NO GROWTH 4 DAYS  Final   Report Status PENDING  Incomplete  Wound culture     Status: None   Collection Time: 01/11/15  2:36 PM  Result  Value Ref Range Status   Specimen Description WOUND BACK  Final   Special Requests INTERVERTEBRAL DISC  Final   Gram Stain   Final    NO WBC SEEN NO SQUAMOUS EPITHELIAL CELLS SEEN NO ORGANISMS SEEN Performed at [facility]    Culture   Final    NO GROWTH 2 DAYS Performed at [facility]    Report Status 01/14/2015 FINAL  Final     Labs: Basic Metabolic Panel:  Recent Labs Lab 01/10/15 2000 01/11/15 0540 01/12/15 0448 01/14/15 0634  NA 140 139 138 139  K 4.2 4.2 3.9 3.4*  CL 109 109 108 106  CO2 '25 25 25 26  ' GLUCOSE 93 88 84 93  BUN '12 11 9 9  ' CREATININE 1.27* 1.27* 1.20 1.05  CALCIUM 8.9 8.7* 8.5* 8.7*   Liver Function Tests:  Recent Labs Lab 01/11/15 0540  AST 36  ALT 28  ALKPHOS 120  BILITOT 1.2  PROT 5.7*  ALBUMIN 2.8*   CBC:  Recent Labs Lab 01/10/15 0640 01/11/15 0540 01/12/15 0448 01/14/15 0634  WBC 6.4 5.9 5.7 5.6  HGB 10.2* 10.2* 10.2* 10.0*  HCT 31.4* 31.2* 31.2* 31.4*  MCV 94.0 94.8 95.1 94.0  PLT 125* 121* 118* 126*   CBG:  Recent Labs Lab 01/13/15 1206 01/13/15 1702 01/13/15 2210 01/14/15 0758 01/14/15 1239  GLUCAP 117* 113* 124* 100* 114*     IMAGING STUDIES Mr Thoracic Spine W Wo Contrast  01/10/2015  ADDENDUM REPORT: 01/10/2015 14:36 ADDENDUM: Study discussed by telephone with Dr. Rhina Brackett DAM on 01/10/2015 at 1416 hours. Electronically Signed   By: Genevie Ann M.D.   On: 01/10/2015 14:36  01/10/2015  CLINICAL DATA:  81 year old male with MSSA bacteremia in 2014 resulting in large right paraspinal abscess, right hip osteomyelitis and septic hip arthroplasty. Those required right hip surgery and removal of pacemaker. Recurrent MSSA infection, status post 6 weeks of IV antibiotics but new thoracic spine and rib pain. Subsequent encounter. Exam under anesthesia. EXAM: MRI THORACIC AND LUMBAR SPINE WITHOUT AND WITH CONTRAST TECHNIQUE: Multiplanar and multiecho pulse sequences of the thoracic and lumbar spine were obtained without and with intravenous contrast. CONTRAST:  18m MULTIHANCE GADOBENATE DIMEGLUMINE 529 MG/ML IV SOLN COMPARISON:  Lumbar spine CT 08/27/2012. Thoracic spine CT 02/13/2012. FINDINGS: MR THORACIC SPINE FINDINGS Limited sagittal imaging of the cervical spine is remarkable for chronic disc  and endplate degeneration, but no cervical spine marrow signal changes. Bulky cervical endplate osteophytes, probably resulting in 1 0 more levels of cervical ankylosis. Similar bulky thoracic endplate osteophytes with suspected multilevel thoracic ankylosis. Abnormal fluid signal in the T8-T9 disc and tracking ventral through what appears to be a fracture of the anterior inferior T9 vertebral body corner series 400, image 11). Surrounding marrow edema and abnormal vertebral body enhancement there is trace disc fluid signal with abnormal enhancement also at T8-T9 (series 400, image 11). However there is no T10 superior or T8 inferior T8 endplate edema or enhancement. The T12-L1 level is described in the lumbar section below. No other acute osseous abnormality in the thoracic spine. No thoracic spinal stenosis above T12. Thoracic spinal cord above T12 appears normal. No abnormal thoracic intradural enhancement. No associated paraspinal abscess. Abnormal signal in the lungs dependently. Given the associated anesthesia for this study favor this represents atelectasis with trace pleural fluid. Dolichoectatic descending thoracic aorta with atherosclerosis, but otherwise appears negative. Trace fluid in the trachea related to intubation for exam under anesthesia. MR LUMBAR  SPINE FINDINGS Bulky lumbar endplate osteophytosis with multilevel associated lumbar ankylosis similar to that in the cervical and thoracic spine. Transitional lumbosacral anatomy with a partially lumbarized S1 level (see series 1600, image 10). At the T12-L1 level there is abnormal fluid throughout the disc space, more so anteriorly. There is mild endplate marrow edema and enhancement. Evidence of previous surgery posteriorly at this level. Degenerative multifactorial spinal stenosis with mild spinal cord mass effect (series 1600, image 11). No definite abnormal cord signal here which is just above the conus which terminates at L1-L2. There is small  volume abnormal fluid signal and enhancement in the anterior and left lateral disc space at L4-L5 (series 1700, image 11). No associated endplate enhancement. Small enhancing degenerative Schmorl node at the L5 inferior endplate incidentally noted. No other acute osseous lesion or suspicious marrow changes. Visible sacrum intact. Partially visible bladder distension.  Cholelithiasis. IMPRESSION: 1. Widespread spinal ankylosis, favor related to diffuse idiopathic skeletal hyperostosis. Chronic postoperative changes to the posterior elements of the thoracolumbar junction and throughout the lower lumbar spine. 2. Acute discitis osteomyelitis at T9-T10, T12-L1. Early discitis at T8-T9 and L4-L5. No epidural or paraspinal abscess. 3. Superimposed degenerative spinal stenosis at T12-L1 with spinal cord mass effect but no definite cord signal abnormality. 4. Abnormal lung bases favor due to atelectasis and trace pleural effusions. Electronically Signed: By: Genevie Ann M.D. On: 01/10/2015 14:07   Mr Lumbar Spine W Wo Contrast  01/10/2015  ADDENDUM REPORT: 01/10/2015 14:36 ADDENDUM: Study discussed by telephone with Dr. Rhina Brackett DAM on 01/10/2015 at 1416 hours. Electronically Signed   By: Genevie Ann M.D.   On: 01/10/2015 14:36  01/10/2015  CLINICAL DATA:  81 year old male with MSSA bacteremia in 2014 resulting in large right paraspinal abscess, right hip osteomyelitis and septic hip arthroplasty. Those required right hip surgery and removal of pacemaker. Recurrent MSSA infection, status post 6 weeks of IV antibiotics but new thoracic spine and rib pain. Subsequent encounter. Exam under anesthesia. EXAM: MRI THORACIC AND LUMBAR SPINE WITHOUT AND WITH CONTRAST TECHNIQUE: Multiplanar and multiecho pulse sequences of the thoracic and lumbar spine were obtained without and with intravenous contrast. CONTRAST:  10m MULTIHANCE GADOBENATE DIMEGLUMINE 529 MG/ML IV SOLN COMPARISON:  Lumbar spine CT 08/27/2012. Thoracic spine CT  02/13/2012. FINDINGS: MR THORACIC SPINE FINDINGS Limited sagittal imaging of the cervical spine is remarkable for chronic disc and endplate degeneration, but no cervical spine marrow signal changes. Bulky cervical endplate osteophytes, probably resulting in 1 0 more levels of cervical ankylosis. Similar bulky thoracic endplate osteophytes with suspected multilevel thoracic ankylosis. Abnormal fluid signal in the T8-T9 disc and tracking ventral through what appears to be a fracture of the anterior inferior T9 vertebral body corner series 400, image 11). Surrounding marrow edema and abnormal vertebral body enhancement there is trace disc fluid signal with abnormal enhancement also at T8-T9 (series 400, image 11). However there is no T10 superior or T8 inferior T8 endplate edema or enhancement. The T12-L1 level is described in the lumbar section below. No other acute osseous abnormality in the thoracic spine. No thoracic spinal stenosis above T12. Thoracic spinal cord above T12 appears normal. No abnormal thoracic intradural enhancement. No associated paraspinal abscess. Abnormal signal in the lungs dependently. Given the associated anesthesia for this study favor this represents atelectasis with trace pleural fluid. Dolichoectatic descending thoracic aorta with atherosclerosis, but otherwise appears negative. Trace fluid in the trachea related to intubation for exam under anesthesia. MR LUMBAR SPINE FINDINGS Bulky lumbar endplate  osteophytosis with multilevel associated lumbar ankylosis similar to that in the cervical and thoracic spine. Transitional lumbosacral anatomy with a partially lumbarized S1 level (see series 1600, image 10). At the T12-L1 level there is abnormal fluid throughout the disc space, more so anteriorly. There is mild endplate marrow edema and enhancement. Evidence of previous surgery posteriorly at this level. Degenerative multifactorial spinal stenosis with mild spinal cord mass effect (series  1600, image 11). No definite abnormal cord signal here which is just above the conus which terminates at L1-L2. There is small volume abnormal fluid signal and enhancement in the anterior and left lateral disc space at L4-L5 (series 1700, image 11). No associated endplate enhancement. Small enhancing degenerative Schmorl node at the L5 inferior endplate incidentally noted. No other acute osseous lesion or suspicious marrow changes. Visible sacrum intact. Partially visible bladder distension.  Cholelithiasis. IMPRESSION: 1. Widespread spinal ankylosis, favor related to diffuse idiopathic skeletal hyperostosis. Chronic postoperative changes to the posterior elements of the thoracolumbar junction and throughout the lower lumbar spine. 2. Acute discitis osteomyelitis at T9-T10, T12-L1. Early discitis at T8-T9 and L4-L5. No epidural or paraspinal abscess. 3. Superimposed degenerative spinal stenosis at T12-L1 with spinal cord mass effect but no definite cord signal abnormality. 4. Abnormal lung bases favor due to atelectasis and trace pleural effusions. Electronically Signed: By: Genevie Ann M.D. On: 01/10/2015 14:07   Ir Fluoro Guide Cv Line Right  01/11/2015  INDICATION: Patient with history of discitis/osteomyelitis of thoracolumbar spine; central venous access is requested for fluids and medications. EXAM: ULTRASOUND AND FLUOROSCOPIC GUIDED PICC LINE INSERTION MEDICATIONS: None. CONTRAST:  None FLUOROSCOPY TIME:  48 seconds. COMPLICATIONS: None immediate TECHNIQUE: The procedure, risks, benefits, and alternatives were explained to the patient and informed written consent was obtained. A timeout was performed prior to the initiation of the procedure. The right upper extremity was prepped with chlorhexidine in a sterile fashion, and a sterile drape was applied covering the operative field. Maximum barrier sterile technique with sterile gowns and gloves were used for the procedure. A timeout was performed prior to the  initiation of the procedure. Local anesthesia was provided with 1% lidocaine. Under direct ultrasound guidance, the right basilic vein was accessed with a micropuncture kit after the overlying soft tissues were anesthetized with 1% lidocaine. An ultrasound image was saved for documentation purposes. A guidewire was advanced to the level of the superior caval-atrial junction for measurement purposes and the PICC line was cut to length. A peel-away sheath was placed and a 44 cm, 5 [place], dual lumen was inserted to level of the superior caval-atrial junction. A post procedure spot fluoroscopic was obtained. The catheter easily aspirated and flushed and was sutured in place. A dressing was placed. The patient tolerated the procedure well without immediate post procedural complication. FINDINGS: After catheter placement, the tip lies within the superior cavoatrial junction. The catheter aspirates and flushes normally and is ready for immediate use. IMPRESSION: Successful ultrasound and fluoroscopic guided placement of a right basilic vein approach, 44 cm, 5 French, dual lumen PICC with tip at the superior caval-atrial junction. The PICC line is ready for immediate use. Read by: Rowe Robert, PA-C Electronically Signed   By: Marybelle Killings M.D.   On: 01/11/2015 13:58   Ir US Guide Vasc Access Right  01/11/2015  INDICATION: Patient with history of discitis/osteomyelitis of thoracolumbar spine; central venous access is requested for fluids and medications. EXAM: ULTRASOUND AND FLUOROSCOPIC GUIDED PICC LINE INSERTION MEDICATIONS: None. CONTRAST:  None FLUOROSCOPY  TIME:  48 seconds. COMPLICATIONS: None immediate TECHNIQUE: The procedure, risks, benefits, and alternatives were explained to the patient and informed written consent was obtained. A timeout was performed prior to the initiation of the procedure. The right upper extremity was prepped with chlorhexidine in a sterile fashion, and a sterile drape was applied  covering the operative field. Maximum barrier sterile technique with sterile gowns and gloves were used for the procedure. A timeout was performed prior to the initiation of the procedure. Local anesthesia was provided with 1% lidocaine. Under direct ultrasound guidance, the right basilic vein was accessed with a micropuncture kit after the overlying soft tissues were anesthetized with 1% lidocaine. An ultrasound image was saved for documentation purposes. A guidewire was advanced to the level of the superior caval-atrial junction for measurement purposes and the PICC line was cut to length. A peel-away sheath was placed and a 44 cm, 5 [place], dual lumen was inserted to level of the superior caval-atrial junction. A post procedure spot fluoroscopic was obtained. The catheter easily aspirated and flushed and was sutured in place. A dressing was placed. The patient tolerated the procedure well without immediate post procedural complication. FINDINGS: After catheter placement, the tip lies within the superior cavoatrial junction. The catheter aspirates and flushes normally and is ready for immediate use. IMPRESSION: Successful ultrasound and fluoroscopic guided placement of a right basilic vein approach, 44 cm, 5 French, dual lumen PICC with tip at the superior caval-atrial junction. The PICC line is ready for immediate use. Read by: Rowe Robert, PA-C Electronically Signed   By: Marybelle Killings M.D.   On: 01/11/2015 13:58   Ir Fluoro Guide Ndl Plmt / Bx  01/11/2015  CLINICAL DATA:  T12-L1 discitis EXAM: IR FLUORO GUIDE NEEDLE PLACEMENT /BIOPSY FLUOROSCOPY TIME:  3 minutes and 36 seconds MEDICATIONS AND MEDICAL HISTORY: Versed 2 mg, Fentanyl 100 mcg. ANESTHESIA/SEDATION: Moderate sedation time: 15 minutes CONTRAST:  None PROCEDURE: The procedure, risks, benefits, and alternatives were explained to the patient. Questions regarding the procedure were encouraged and answered. The patient understands and consents to the  procedure. The back was prepped with Betadine in a sterile fashion, and a sterile drape was applied covering the operative field. A sterile gown and sterile gloves were used for the procedure. Under fluoroscopic guidance, an 18 gauge needle was inserted into the T12-L1 disc space via left posterior lateral approach. 2 cc serosanguineous fluid was aspirated. FINDINGS: Images document needle placement in the T12-L1 disc space. COMPLICATIONS: None IMPRESSION: Successful T12-L1 disc aspiration. Electronically Signed   By: Marybelle Killings M.D.   On: 01/11/2015 14:59    DISCHARGE EXAMINATION: Filed Vitals:   01/13/15 1253 01/13/15 2151 01/14/15 0551 01/14/15 1043  BP: 140/81 156/97 146/60 133/80  Pulse: 66 114 70 81  Temp: 98.7 F (37.1 C) 99.2 F (37.3 C) 98.8 F (37.1 C)   TempSrc: Oral Oral Oral   Resp: '18 18 18   ' Height:      Weight:      SpO2: 98% 97% 86%    General appearance: alert, cooperative, appears stated age and no distress Resp: clear to auscultation bilaterally Cardio: regular rate and rhythm, S1, S2 normal, no murmur, click, rub or gallop GI: soft, non-tender; bowel sounds normal; no masses,  no organomegaly  DISPOSITION: Home with home health  Discharge Instructions    Call MD for:  difficulty breathing, headache or visual disturbances    Complete by:  As directed      Call MD for:  extreme fatigue    Complete by:  As directed      Call MD for:  persistant dizziness or light-headedness    Complete by:  As directed      Call MD for:  persistant nausea and vomiting    Complete by:  As directed      Call MD for:  severe uncontrolled pain    Complete by:  As directed      Call MD for:  temperature >100.4    Complete by:  As directed      Diet Carb Modified    Complete by:  As directed      Discharge instructions    Complete by:  As directed   Please note that a home health nurse will be coming home to administer IV antibiotics. He will need to follow-up with your primary  care physician in one week. You will also need to follow-up with Dr. Lucianne Lei dam in 2 weeks' time.  You were cared for by a hospitalist during your hospital stay. If you have any questions about your discharge medications or the care you received while you were in the hospital after you are discharged, you can call the unit and asked to speak with the hospitalist on call if the hospitalist that took care of you is not available. Once you are discharged, your primary care physician will handle any further medical issues. Please note that NO REFILLS for any discharge medications will be authorized once you are discharged, as it is imperative that you return to your primary care physician (or establish a relationship with a primary care physician if you do not have one) for your aftercare needs so that they can reassess your need for medications and monitor your lab values. If you do not have a primary care physician, you can call [phone] for a physician referral.     Increase activity slowly    Complete by:  As directed            ALLERGIES:  Allergies  Allergen Reactions  . Ambien [Zolpidem] Other (See Comments)    hallucinations     Current Discharge Medication List    START taking these medications   Details  ceFAZolin (ANCEF) 2-3 GM-% SOLR Inject 50 mLs (2 g total) into the vein every 8 (eight) hours. For 6 weeks from start: Till January 24th. PICC Care Per Protocol: Labs weekly while on IV antibiotics: _x CBC with differential x CMP x_ CRP -- only on last wk of abtx January 24th  _x_ ESR --- only on last wk of abtx January 24th Fax weekly labs to [phone] Qty: 6300 mL, Refills: 0      CONTINUE these medications which have NOT CHANGED   Details  acetaminophen (TYLENOL) 500 MG tablet Take 750 mg by mouth daily as needed (pain).    ALPRAZolam (XANAX) 0.5 MG tablet Take 0.5 mg by mouth daily as needed for anxiety.     aspirin EC 325 MG tablet Take 1 tablet (325 mg total)  by mouth 2 (two) times daily. Qty: 30 tablet, Refills: 0    diltiazem (CARDIZEM CD) 120 MG 24 hr capsule Take 120 mg by mouth daily.    ferrous sulfate 325 (65 FE) MG tablet Take 325 mg by mouth daily with breakfast.    finasteride (PROSCAR) 5 MG tablet Take 5 mg by mouth at bedtime.     HYDROcodone-acetaminophen (NORCO/VICODIN) 5-325 MG tablet Take 1 tablet by mouth 2 (two) times  daily as needed (pain).    lactulose (CHRONULAC) 10 GM/15ML solution Take 20 g by mouth daily.    levothyroxine (SYNTHROID, LEVOTHROID) 88 MCG tablet Take 88 mcg by mouth daily before breakfast.    lovastatin (MEVACOR) 40 MG tablet Take 40 mg by mouth at bedtime.     metolazone (ZAROXOLYN) 5 MG tablet Take 2.5 mg by mouth every Monday, Wednesday, and Friday.     metoprolol succinate (TOPROL-XL) 25 MG 24 hr tablet Take 12.5 mg by mouth daily.    oxyCODONE (OXY IR/ROXICODONE) 5 MG immediate release tablet Take 5 mg by mouth daily as needed for severe pain (pain).    pioglitazone (ACTOS) 15 MG tablet Take 15 mg by mouth daily.    Associated Diagnoses: Anemia of chronic disease; Thrombocytopenia, unspecified ([place]); Unspecified deficiency anemia    potassium chloride SA (K-DUR,KLOR-CON) 20 MEQ tablet Take 1 tablet (20 mEq total) by mouth daily. Qty: 3 tablet, Refills: 0    Probiotic Product (PROBIOTIC DAILY PO) Take 1 capsule by mouth daily.     terazosin (HYTRIN) 5 MG capsule Take 5 mg by mouth at bedtime.     torsemide (DEMADEX) 20 MG tablet Take 20 mg by mouth daily.     docusate sodium 100 MG CAPS Take 100 mg by mouth 2 (two) times daily. Qty: 10 capsule, Refills: 0    ONE TOUCH ULTRA TEST test strip     tiZANidine (ZANAFLEX) 2 MG tablet Take 1 tablet (2 mg total) by mouth every 6 (six) hours as needed for muscle spasms. Qty: 60 tablet, Refills: 0      STOP taking these medications     cephALEXin (KEFLEX) 500 MG capsule      diazepam (VALIUM) 5 MG tablet        Follow-up Information     Follow up with [facility].   Why:  HHRN for IV ABX, and HHPT   Contact information:   [address] [place] [address] [phone]       Follow up with Alcide Evener, MD. Schedule an appointment as soon as possible for a visit in 2 weeks.   Specialty:  Infectious Diseases   Why:  follow up for spine infection, January 3rd at 2:30 p.m.   Contact information:   [address] [place] [address] [phone]       Follow up with JOBE,DANIEL B., MD. Schedule an appointment as soon as possible for a visit in 1 week.   Specialty:  Internal Medicine   Why:  post hospitalization follow up; Thursday 22nd at 9 a.m.   Contact information:   [address] [place] [address] [phone]       TOTAL DISCHARGE TIME: 35 minutes  [facility] Pager [phone]  01/14/2015, 2:52 PM

## 2015-01-14 NOTE — Progress Notes (Signed)
Physical Therapy Treatment Patient Details Name: Joshua BrookeHarda [name] MRN: [id number] DOB: Mar 08, 1930 Today's Date: 01/14/2015    History of Present Illness 81 year old Caucasian male with a past medical history of atrial fibrillation, not on anticoagulation, hypertension, diabetes, chronic kidney disease with a complex orthopedic history involving his right hip with multiple infections. Presented after MRI of his spine revealed discitis at multiple levels. He was hospitalized for further management. Discitis osteomyelitis T9-10, T12-L1    PT Comments    Pt making steady progress.  Follow Up Recommendations  Home health PT;Supervision/Assistance - 24 hour     Equipment Recommendations  None recommended by PT    Recommendations for Other Services       Precautions / Restrictions Precautions Precautions: Fall Restrictions Weight Bearing Restrictions: No    Mobility  Bed Mobility Overal bed mobility: Needs Assistance Bed Mobility: Rolling;Sidelying to Sit Rolling: Supervision Sidelying to sit: Min assist       General bed mobility comments: assist to manage LEs. Educated on log roll technique.  Transfers Overall transfer level: Needs assistance Equipment used: Rolling walker (2 wheeled)   Sit to Stand: Min guard Stand pivot transfers: Min assist       General transfer comment: Assist to bring hips up.from low chair  Ambulation/Gait Ambulation/Gait assistance: Min guard Ambulation Distance (Feet): 60 Feet Assistive device: Rolling walker (2 wheeled) Gait Pattern/deviations: Step-through pattern;Decreased step length - right;Decreased step length - left;Trunk flexed Gait velocity: decr Gait velocity interpretation: Below normal speed for age/gender General Gait Details: Heavy reliance on UE's which results in pain and fatigue of shoulders   Stairs            Wheelchair Mobility    Modified Rankin (Stroke Patients Only)       Balance    Sitting-balance support: No upper extremity supported;Feet supported Sitting balance-Leahy Scale: Good     Standing balance support: Bilateral upper extremity supported Standing balance-Leahy Scale: Poor Standing balance comment: support of walker                    Cognition Arousal/Alertness: Awake/alert Behavior During Therapy: WFL for tasks assessed/performed Overall Cognitive Status: Within Functional Limits for tasks assessed                      Exercises      General Comments        Pertinent Vitals/Pain      Home Living Family/patient expects to be discharged to:: Private residence Living Arrangements: Spouse/significant other;Other (Comment) (grandchild) Available Help at Discharge: Family Type of Home: House Home Access: Stairs to enter Entrance Stairs-Rails: None Home Layout: Two level;Able to live on main level with bedroom/bathroom Home Equipment: Dan HumphreysWalker - 2 wheels;Cane - single point;Bedside commode;Grab bars - tub/shower;Shower seat - built in;Toilet riser;Adaptive equipment      Prior Function Level of Independence: Needs assistance  Gait / Transfers Assistance Needed: Ambulates short distances with walker; reports using w/c in community ADL's / Homemaking Assistance Needed: able to dress and bathe self; assist with cooking and cleaning     PT Goals (current goals can now be found in the care plan section) Acute Rehab PT Goals Patient Stated Goal: not stated Progress towards PT goals: Progressing toward goals    Frequency  Min 3X/week    PT Plan Current plan remains appropriate    Co-evaluation             End of Session   Activity Tolerance: Patient limited  by fatigue Patient left: in chair;with call bell/phone within reach;with chair alarm set     Time:  -     Charges:  $Gait Training: 8-22 mins                    G Codes:      [name] 01-30-2015, 1:07 PM [employer] PT [phone]

## 2015-01-14 NOTE — Progress Notes (Signed)
[facility]    Subjective: Patient feels well today, mild back discomfort (2-3/10). He says he is much improved compared to when he came to the hospital.  Objective: Vital signs in last 24 hours: Filed Vitals:   01/13/15 0506 01/13/15 1253 01/13/15 2151 01/14/15 0551  BP: 135/70 140/81 156/97 146/60  Pulse: 78 66 114 70  Temp: 98.2 F (36.8 C) 98.7 F (37.1 C) 99.2 F (37.3 C) 98.8 F (37.1 C)  TempSrc: Oral Oral Oral Oral  Resp: _0 Height:      Weight: 237 lb 14 oz (107.9 kg)     SpO2: 97% 98% 97% 86%   Weight change:   Intake/Output Summary (Last 24 hours) at 01/14/15 0953 Last data filed at 01/14/15 0948  Gross per 24 hour  Intake    570 ml  Output    950 ml  Net   -380 ml   General: sitting in chair, pleasant, no acute distress Cardiac: irregularly irregular Pulm: clear to auscultation bilaterally, moving normal volumes of air Ext: warm and well perfused   Lab Results: Basic Metabolic Panel:  Recent Labs Lab 01/12/15 0448 01/14/15 0634  NA 138 139  K 3.9 3.4*  CL 108 106  CO2 25 26  GLUCOSE 84 93  BUN 9 9  CREATININE 1.20 1.05  CALCIUM 8.5* 8.7*   CBC:  Recent Labs Lab 01/12/15 0448 01/14/15 0634  WBC 5.7 5.6  HGB 10.2* 10.0*  HCT 31.2* 31.4*  MCV 95.1 94.0  PLT 118* 126*     Micro Results: Recent Results (from the past 240 hour(s))  Culture, blood (Routine X 2) w Reflex to ID Panel     Status: None (Preliminary result)   Collection Time: 01/10/15  4:15 PM  Result Value Ref Range Status   Specimen Description BLOOD RIGHT HAND  Final   Special Requests IN PEDIATRIC BOTTLE 4CC  Final   Culture NO GROWTH 3 DAYS  Final   Report Status PENDING  Incomplete  Culture, blood (Routine X 2) w Reflex to ID Panel     Status: None (Preliminary result)   Collection Time: 01/10/15  4:30 PM  Result Value Ref Range Status   Specimen Description BLOOD RIGHT HAND  Final   Special Requests IN PEDIATRIC BOTTLE  2CC  Final   Culture NO GROWTH 3 DAYS  Final   Report Status PENDING  Incomplete  Wound culture     Status: None   Collection Time: 01/11/15  2:36 PM  Result Value Ref Range Status   Specimen Description WOUND BACK  Final   Special Requests INTERVERTEBRAL DISC  Final   Gram Stain   Final    NO WBC SEEN NO SQUAMOUS EPITHELIAL CELLS SEEN NO ORGANISMS SEEN Performed at [facility]    Culture   Final    NO GROWTH 2 DAYS Performed at [facility]    Report Status 01/14/2015 FINAL  Final   Assessment/Plan: Principal Problem:   Discitis of thoracolumbar region Active Problems:   HTN (hypertension)   Anemia of chronic disease   CKD (chronic kidney disease), stage III   Chronic atrial fibrillation (HCC)   Hypothyroidism   HLD (hyperlipidemia)   BPH (benign prostatic hyperplasia)   DM type 2 (diabetes mellitus, type 2) (HCC)   Osteomyelitis of thoracic spine (HCC)  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   Discitis/Osteomyelitis of thoracolumbar spine: Patient had been taking Keflex 1 g BID daily on outpatient basis and was held for 2 days prior to disc aspiration procedure. PICC line and T12/L1 Disk aspiration completed by IR on 01/11/15.  -Cultures NGTD. -Day 4 of antibiotic Cefazolin -Continue IV Cefazolin 2 grams q8hr for 6 weeks total duration (start 01/11/15, end 02/22/15) -Check weekly CBC and CMP on IV antibiotics -Check CRP, ESR on last week of antibiotics (January 24th) -Follow up outpatient in ID clinic     LOS: 4 days   Zada Finders, MD 01/14/2015, 9:53 AM

## 2015-01-14 NOTE — Care Management Note (Addendum)
Case Management Note  Patient Details  Name: [name] MRN: [id number] Date of Birth: February 12, 1930  Subjective/Objective:    Date: 01/14/15 Spoke with patient at the bedside.  Introduced self as Sports coachcase manager and explained role in discharge planning and how to be reached.  Verified patient lives in town, alone with spouse. Has DME rolling walker, hospital bed and a raised toilet seat. Expressed potential need for no other DME.  Verified patient anticipates to go home with family, at time of discharge and will have part-time supervision by family at this time to best of their knowledge. Patient denied needing help with their medication.  Patient  is driven by wife or daughter n law to MD appointments.  Verified patient has PCP Synetta Failaniel Jobe. [facility], [facility], Patient is for dc today, later this afternoon. Patient states his wife works but will be home by 5 pm, her phone is [phone] ([place]).    Plan: CM will continue to follow for discharge planning and [facility].                Action/Plan:   Expected Discharge Date:                  Expected Discharge Plan:  Home w Home Health Services  In-House Referral:     Discharge planning Services  CM Consult  Post Acute Care Choice:    Choice offered to:  Patient  DME Arranged:    DME Agency:     HH Arranged:  RN, PT, iv antibiotics HH Agency:  [facility]  Status of Service:  Completed, signed off  Medicare Important Message Given:    Date Medicare IM Given:    Medicare IM give by:    Date Additional Medicare IM Given:    Additional Medicare Important Message give by:     If discussed at Long Length of Stay Meetings, dates discussed:    Additional Comments:  Leone Havenaylor, Aissatou Fronczak [place], RN 01/14/2015, 11:01 AM

## 2015-01-14 NOTE — Care Management Important Message (Signed)
Important Message  Patient Details  Name: Charlene BrookeHarda Watkinson MRN: [id number] Date of Birth: 1930/08/15   Medicare Important Message Given:  Yes    Kyla BalzarineShealy, Lonya Johannesen Abena 01/14/2015, 12:22 PM

## 2015-01-14 NOTE — Discharge Instructions (Signed)
Diskitis °Diskitis is irritation and swelling (inflammation) of the disks in the spine. Disks are soft structures that cushion the bones of the spine. This is not a common condition. Diskitis most often affects the disks of the lower back (lumbar disks) or upper back (thoracic disks). °CAUSES  °A bacterial or viral infection can lead to diskitis. When diskitis develops, it is often accompanied by infection-induced inflammation of the bones (osteomyelitis) surrounding the spine. The condition can also be caused by inflammation from another condition, like an autoimmune disease. If you have an autoimmune disease, your body's defense system (immune system) mistakenly attacks your own healthy cells instead of germs and other things that can make you sick. °RISK FACTORS °People at higher risk of developing diskitis include: °· Children. °· Older persons. °· People with weak immune systems or immune system disorders. °· People with diabetes. °· People having chemotherapy. °SIGNS AND SYMPTOMS  °Back or stomach pain is the most common symptom of diskitis. Walking, standing, and sitting may be painful. Other symptoms may include:  °· Trouble standing or rising from a sitting position. °· Fever lower than 102°F (38.9°C). °· Back stiffness. °· Flu-like symptoms, such as a sore throat and a runny nose. °· Irritability. °· Feeling pain when the affected area is touched. °DIAGNOSIS  °Your health care provider can diagnose diskitis based on symptoms and medical history. Your health care provider will also do a physical exam. You may also need to have blood tests or imaging studies, such as: °· X-ray of the spine. °· MRI of the spine. °· A bone scan. °TREATMENT  °Treatment for diskitis may include: °· Bed rest. °· Medicines, such as: °¨ Antibiotics to treat a possible bacterial infection. °¨ Anti-inflammatory medicines. °¨ Steroids if the condition does not improve over time. °¨ Pain-relieving medicines. °· A brace to stop your  back from moving. °HOME CARE INSTRUCTIONS °· If you were prescribed an antibiotic medicine, finish it all even if you start to feel better. °· Take medicines only as directed by your health care provider. °· Keep all follow-up visits as directed by your health care provider. This is important. °SEEK MEDICAL CARE IF:  °· You have difficulty walking or standing. °· You have persistent back pain. °· You are having side effects from medicines. °  °This information is not intended to replace advice given to you by your health care provider. Make sure you discuss any questions you have with your health care provider. °  °Document Released: 12/14/2003 Document Revised: 02/02/2014 Document Reviewed: 05/17/2013 °Elsevier Interactive Patient Education ©2016 [employer] ° °

## 2015-01-15 LAB — CULTURE, BLOOD (ROUTINE X 2)
CULTURE: NO GROWTH
Culture: NO GROWTH

## 2015-01-17 MED FILL — Sugammadex Sodium IV 200 MG/2ML (Base Equivalent): INTRAVENOUS | Qty: 2 | Status: AC

## 2015-01-17 MED FILL — Lidocaine HCl IV Inj 20 MG/ML: INTRAVENOUS | Qty: 5 | Status: AC

## 2015-01-17 MED FILL — Phenylephrine HCl Inj 10 MG/ML: INTRAMUSCULAR | Qty: 1 | Status: AC

## 2015-01-17 MED FILL — Fentanyl Citrate Preservative Free (PF) Inj 100 MCG/2ML: INTRAMUSCULAR | Qty: 4 | Status: AC

## 2015-01-17 MED FILL — Lactated Ringer's Solution: INTRAVENOUS | Qty: 1000 | Status: AC

## 2015-01-17 MED FILL — Propofol IV Emul 200 MG/20ML (10 MG/ML): INTRAVENOUS | Qty: 20 | Status: AC

## 2015-01-17 MED FILL — Midazolam HCl Inj 2 MG/2ML (Base Equivalent): INTRAMUSCULAR | Qty: 2 | Status: AC

## 2015-01-17 MED FILL — Ondansetron HCl Inj 4 MG/2ML (2 MG/ML): INTRAMUSCULAR | Qty: 2 | Status: AC

## 2015-01-17 MED FILL — Ephedrine Sulf-NaCl Soln Pref Syr 50 MG/10ML-0.9% (5 MG/ML): INTRAVENOUS | Qty: 10 | Status: AC

## 2015-01-27 DIAGNOSIS — Z45018 Encounter for adjustment and management of other part of cardiac pacemaker: Secondary | ICD-10-CM

## 2015-01-27 HISTORY — DX: Encounter for adjustment and management of other part of cardiac pacemaker: Z45.018

## 2015-01-29 ENCOUNTER — Ambulatory Visit (INDEPENDENT_AMBULATORY_CARE_PROVIDER_SITE_OTHER): Payer: Medicare Other | Admitting: Infectious Disease

## 2015-01-29 ENCOUNTER — Telehealth: Payer: Self-pay | Admitting: *Deleted

## 2015-01-29 ENCOUNTER — Encounter: Payer: Self-pay | Admitting: Infectious Disease

## 2015-01-29 VITALS — BP 124/61 | HR 76 | Temp 98.0°F | Wt 213.0 lb

## 2015-01-29 DIAGNOSIS — T827XXD Infection and inflammatory reaction due to other cardiac and vascular devices, implants and grafts, subsequent encounter: Secondary | ICD-10-CM

## 2015-01-29 DIAGNOSIS — M4624 Osteomyelitis of vertebra, thoracic region: Secondary | ICD-10-CM | POA: Diagnosis not present

## 2015-01-29 DIAGNOSIS — R7881 Bacteremia: Secondary | ICD-10-CM

## 2015-01-29 DIAGNOSIS — A4901 Methicillin susceptible Staphylococcus aureus infection, unspecified site: Secondary | ICD-10-CM | POA: Diagnosis not present

## 2015-01-29 DIAGNOSIS — Z96649 Presence of unspecified artificial hip joint: Secondary | ICD-10-CM

## 2015-01-29 DIAGNOSIS — M4645 Discitis, unspecified, thoracolumbar region: Secondary | ICD-10-CM

## 2015-01-29 DIAGNOSIS — T8459XD Infection and inflammatory reaction due to other internal joint prosthesis, subsequent encounter: Secondary | ICD-10-CM

## 2015-01-29 DIAGNOSIS — B9561 Methicillin susceptible Staphylococcus aureus infection as the cause of diseases classified elsewhere: Secondary | ICD-10-CM

## 2015-01-29 DIAGNOSIS — S73004D Unspecified dislocation of right hip, subsequent encounter: Secondary | ICD-10-CM

## 2015-01-29 NOTE — Telephone Encounter (Signed)
perfect

## 2015-01-29 NOTE — Patient Instructions (Signed)
We will continue the IV cefazolin (ancef) thru 8 weeks which will be February the 10th,  We will have PICC line pulled on the 10th  Then start back on keflex on the 11th

## 2015-01-29 NOTE — Progress Notes (Signed)
Chief complaint: lower back pain improving  Subjective:    Patient ID: [name], male    DOB: 1930/12/20, 82 y.o.   MRN: [id number]  HPI   82 y.o. male with Pacemaker, Right prosthetic hip, Laminectomy in January of 2014 admitted in August 2014 with right leg pain weakness fevers. He has been found to have MSSA bacteremia and a large retroperitoneal abscess that is adjacent to his prosthetic hip.  Orthopedics performed resection of Radical irrigation and debridement right total hip with removal of all synovium, revision of femoral head, revision of polyethylene liner placement On 09/02/12  TEE failed to show vegetations on valves or overtly on PM. PM is out. He returned to the hospital with dislocation of his hip. This was able to be reduced without surgical intervention. My partner Dr. Johnnye Sima saw the patient and noted his sedimentation rate and C-reactive protein is still be quite high. Patient completed a course of  8 weeks of cefazolin 2 g IV every 8 hours at the skilled nursing facility along with  oral rifampin twice daily.   I changed him to oral Keflex when his IV antibiotics were stopped and he continued on high-dose Keflex with rifampin since then.   He had recurrent hip dislocations since then.  He was readmitted in late October 2014 and underwent  Irrigation and debridement of infected right total hip with removal of all synovium, revision of femoral head to a +5 40 mm metal head, revision of polyethylene liner to a constrained liner  He had remained on keflex 561m po tid in the interim but stopped the rifampin.  His hip pain  dissapeared but he then developed pain in the right leg itself and difficulty raising leg vs gravity though he can bear weight on it in fall of 2015.  Since then he has experienced recurrence of MSSA infeciton of his hip requiring  Radical irrigation and debridement of infected hybrid right total hip with removal of all synovium, removal of all  components including cement going down the femur and the distal cement restrictor Hemovac drains. Placement of acetabular polymethylmethacrylate spacer   By Dr. RMayer Camelon July 16th, 2016.  intraop cultures yielded no organism but he was on keflex (and rifampin prior to surgery). He had  been placed back on Ancef IV and then transitioned back to oral keflex currently at 1 g po bid.  This November we found that he had  recurrence of pain in his thoracic region including his ribs. He underwent a CTA that did not show a PE on 11/26/14. He was not having fevers but his pain in his ribs and lower back is much worse.  We obtained MRI with sedation which showed:  Acute discitis osteomyelitis at T9-T10, T12-L1. Early discitis at T8-T9 and L4-L5, but no epidural or paraspinal abscess.  We had him urgently admitted to the hospital and had the antibiotics held. IR aspirate from the disk yielded no organisms and he was restarted on IV ancef. His back pain has improved substantially since then typically worse when getting up from supine position. No fevers or malaise   Past Medical History  Diagnosis Date  . Atrial fibrillation ([place]   . Hypertension   . Pacemaker   . Arthritis   . Anxiety   . Depression   . MSSA (methicillin susceptible Staphylococcus aureus) infection   . Bacteremia   . [place] ([place]   . Septic hip ([place]   . Complication of anesthesia     "  Terrible Nightmares"  . Dysrhythmia     Afib- had pacemaker, had it removed- takes Aspirin 325- daily  . Diabetes mellitus without complication ([place])     Type II  . Constipation   . History of blood product transfusion   . OAB (overactive bladder)   . CKD (chronic kidney disease)   . Hypothyroidism   . Thoracic spine pain 11/28/2014    Past Surgical History  Procedure Laterality Date  . Pacemaker insertion    . Lumbar laminectomy/decompression microdiscectomy  02/14/2012    Procedure: LUMBAR LAMINECTOMY/DECOMPRESSION  MICRODISCECTOMY 1 LEVEL;  Surgeon: Eustace Moore, MD;  Location: [facility];  Service: Neurosurgery;  Laterality: N/A;  Thoracic twelve - Lumbar one decompressive laminectomy.  Darden Dates without cardioversion N/A 08/30/2012    Procedure: TRANSESOPHAGEAL ECHOCARDIOGRAM (TEE);  Surgeon: Larey Dresser, MD;  Location: [facility];  Service: Cardiovascular;  Laterality: N/A;  . Icd lead removal Left 08/31/2012    Procedure: ICD LEAD REMOVAL;  Surgeon: Evans Lance, MD;  Location: [facility];  Service: Cardiovascular;  Laterality: Left;  . Incision and drainage hip Right 09/02/2012    Procedure: IRRIGATION AND DEBRIDEMENT RIGHT HIP;  Surgeon: Kerin Salen, MD;  Location: [facility];  Service: Orthopedics;  Laterality: Right;  . Total hip revision Right 09/02/2012    Procedure: REVISION OF POLYETHYLENE LINER AND FEMORAL HEAD ;  Surgeon: Kerin Salen, MD;  Location: [facility];  Service: Orthopedics;  Laterality: Right;  . Hip closed reduction Right 10/02/2012    Procedure: CLOSED REDUCTION HIP;  Surgeon: Hessie Dibble, MD;  Location: [facility];  Service: Orthopedics;  Laterality: Right;  . Total hip revision Right 11/28/2012    Procedure: TOTAL HIP REVISION With Placement of Contrain Liner;  Surgeon: Kerin Salen, MD;  Location: [facility];  Service: Orthopedics;  Laterality: Right;  . Colonoscopy w/ polypectomy      small  . Incision and drainage hip Right 08/10/2014    Procedure: IRRIGATION AND DEBRIDEMENT RIGHT HIP SUPERFICIAL VS DEEP;  Surgeon: Frederik Pear, MD;  Location: [facility];  Service: Orthopedics;  Laterality: Right;  . Tonsillectomy    . Eye surgery      bilateral  . Joint replacement    . Radiology with anesthesia N/A 01/10/2015    Procedure: MRI LUMBAR SPINE WITH/WITHOUT   (RADIOLOGY WITH ANESTHESIA);  Surgeon: Medication Radiologist, MD;  Location: [facility];  Service: Radiology;  Laterality: N/A;    No family history on file.    Social History   Social History  . Marital Status: Married    Spouse Name: N/A    . Number of Children: N/A  . Years of Education: N/A   Social History Main Topics  . Smoking status: Never Smoker   . Smokeless tobacco: Never Used  . Alcohol Use: No  . Drug Use: No  . Sexual Activity: Not Asked   Other Topics Concern  . None   Social History Narrative    Allergies  Allergen Reactions  . Ambien [Zolpidem] Other (See Comments)    hallucinations     Current outpatient prescriptions:  .  acetaminophen (TYLENOL) 500 MG tablet, Take 750 mg by mouth daily as needed (pain)., Disp: , Rfl:  .  ALPRAZolam (XANAX) 0.5 MG tablet, Take 0.5 mg by mouth daily as needed for anxiety. , Disp: , Rfl:  .  aspirin EC 325 MG tablet, Take 1 tablet (325 mg total) by mouth 2 (two) times daily. (Patient taking differently:  Take 325 mg by mouth daily. ), Disp: 30 tablet, Rfl: 0 .  ceFAZolin (ANCEF) 2-3 GM-% SOLR, Inject 50 mLs (2 g total) into the vein every 8 (eight) hours. For 6 weeks from start: Till January 24th. PICC Care Per Protocol: Labs weekly while on IV antibiotics: _x CBC with differential x CMP x_ CRP -- only on last wk of abtx January 24th  _x_ ESR --- only on last wk of abtx January 24th Fax weekly labs to [phone], Disp: 6300 mL, Rfl: 0 .  diltiazem (CARDIZEM CD) 120 MG 24 hr capsule, Take 120 mg by mouth daily., Disp: , Rfl:  .  docusate sodium 100 MG CAPS, Take 100 mg by mouth 2 (two) times daily., Disp: 10 capsule, Rfl: 0 .  ferrous sulfate 325 (65 FE) MG tablet, Take 325 mg by mouth daily with breakfast., Disp: , Rfl:  .  finasteride (PROSCAR) 5 MG tablet, Take 5 mg by mouth at bedtime. , Disp: , Rfl:  .  HYDROcodone-acetaminophen (NORCO/VICODIN) 5-325 MG tablet, Take 1 tablet by mouth 2 (two) times daily as needed (pain)., Disp: , Rfl:  .  lactulose (CHRONULAC) 10 GM/15ML solution, Take 20 g by mouth daily., Disp: , Rfl:  .  levothyroxine (SYNTHROID, LEVOTHROID) 88 MCG tablet, Take 88 mcg by mouth daily before breakfast., Disp: , Rfl:  .  lovastatin (MEVACOR)  40 MG tablet, Take 40 mg by mouth at bedtime. , Disp: , Rfl:  .  metolazone (ZAROXOLYN) 5 MG tablet, Take 2.5 mg by mouth every Monday, Wednesday, and Friday. , Disp: , Rfl:  .  metoprolol succinate (TOPROL-XL) 25 MG 24 hr tablet, Take 12.5 mg by mouth daily., Disp: , Rfl:  .  ONE TOUCH ULTRA TEST test strip, , Disp: , Rfl:  .  oxyCODONE (OXY IR/ROXICODONE) 5 MG immediate release tablet, Take 5 mg by mouth daily as needed for severe pain (pain)., Disp: , Rfl:  .  pioglitazone (ACTOS) 15 MG tablet, Take 15 mg by mouth daily. , Disp: , Rfl:  .  potassium chloride SA (K-DUR,KLOR-CON) 20 MEQ tablet, Take 1 tablet (20 mEq total) by mouth daily. (Patient taking differently: Take 20 mEq by mouth 2 (two) times daily. ), Disp: 3 tablet, Rfl: 0 .  Probiotic Product (PROBIOTIC DAILY PO), Take 1 capsule by mouth daily. , Disp: , Rfl:  .  terazosin (HYTRIN) 5 MG capsule, Take 5 mg by mouth at bedtime. , Disp: , Rfl:  .  tiZANidine (ZANAFLEX) 2 MG tablet, Take 1 tablet (2 mg total) by mouth every 6 (six) hours as needed for muscle spasms., Disp: 60 tablet, Rfl: 0 .  torsemide (DEMADEX) 20 MG tablet, Take 20 mg by mouth daily. , Disp: , Rfl:    Review of Systems  Constitutional: Negative for chills, diaphoresis, activity change, appetite change, fatigue and unexpected weight change.  HENT: Negative for sore throat.   Eyes: Negative for visual disturbance.  Respiratory: Negative for cough and chest tightness.   Gastrointestinal: Negative for nausea, vomiting, abdominal pain, diarrhea, constipation, blood in stool, abdominal distention and anal bleeding.  Genitourinary: Negative for dysuria, hematuria, flank pain and difficulty urinating.  Musculoskeletal: Positive for back pain and gait problem. Negative for joint swelling and arthralgias.  Skin: Negative for color change, pallor, rash and wound.  Neurological: Positive for weakness. Negative for dizziness, tremors and light-headedness.  Hematological:  Negative for adenopathy. Does not bruise/bleed easily.  Psychiatric/Behavioral: Negative for behavioral problems, confusion, sleep disturbance, dysphoric mood, decreased concentration  and agitation.       Objective:   Physical Exam  Constitutional: He is oriented to person, place, and time. He appears well-developed and well-nourished. No distress.  HENT:  Head: Normocephalic and atraumatic.  Mouth/Throat: Oropharynx is clear and moist. No oropharyngeal exudate.  Eyes: Conjunctivae and EOM are normal. No scleral icterus.  Neck: Normal range of motion. Neck supple.  Cardiovascular: Normal rate, regular rhythm and normal heart sounds.   Pulmonary/Chest: Effort normal and breath sounds normal. No respiratory distress. He has no wheezes. He has no rales. He exhibits no tenderness.  Abdominal: He exhibits no distension. There is tenderness. There is no rebound and no guarding.  Musculoskeletal: He exhibits no tenderness.       Back:  Neurological: He is alert and oriented to person, place, and time. He exhibits normal muscle tone.  Skin: Skin is warm and dry. He is not diaphoretic. No erythema. No pallor.  Psychiatric: He has a normal mood and affect. His behavior is normal. Judgment and thought content normal.  Nursing note and vitals reviewed.  PICC with minimal amount of erythema at he entrance site 01/29/15:           Assessment & Plan:   MSSA bacteremia and a large retroperitoneal abscess prosthetic hip infection, sp resection removal of pacemaker, and sp radical irrigation and debridement right total hip with removal of all synovium, revision of femoral head, revision of polyethylene liner placement 2 years ago and now recurrence with infection sp Radical irrigation and debridement of infected hybrid right total hip with removal of all synovium, removal of all components including cement going down the femur and the distal cement restrictor Hemovac drains. Placement of acetabular  polymethylmethacrylate spacer sp  Nearly 6 weeks of IV cefazolin now on protracted keflex but with new diskitis of the T spine sp aspiration which seems to be responding to IV ancef and likely due to the original culprit MSSA  --finish 8 week course of IV ancef and then go back to high dose keflex (he was taking this NOW as well--to stop while he is on IV abx) --rtc in one month   I spent greater than 25  minutes with the patient including greater than 50% of time in face to face counsel of the patient regarding his MSSA bacteremia pacemaker infection retroperitoneal abscess and recurrent prosthetic hip infection, thoracic spine, diskiits  and in coordination of his care.

## 2015-01-29 NOTE — Telephone Encounter (Signed)
Verbal order per Dr. Daiva EvesVan Dam [facility] to extend IV antibiotics until 03/08/15 and then can pull the picc line. Wendall MolaJacqueline Sartaj Hoskin

## 2015-02-13 ENCOUNTER — Telehealth: Payer: Self-pay | Admitting: Infectious Disease

## 2015-02-13 NOTE — Telephone Encounter (Signed)
He has an appointment with Dr. Derrell Lolling 1/23. RN will forward the labs to Dr. Derrell Lolling - need to call [phone] to confirm fax number [phone]

## 2015-02-13 NOTE — Telephone Encounter (Signed)
Patients Cr up to 1.62 I suspect from overdiuresis  Can he be seen by PCP to workup his renal insufficiency?

## 2015-02-13 NOTE — Telephone Encounter (Signed)
I would also recommend in the interim that he hold his diuretic at present. Perhaps [facility] can get repeat labs on Friday?

## 2015-02-14 NOTE — Telephone Encounter (Signed)
Dr. Derrell Lolling is at [facility]  [phone].  Labs from last 2 weeks faxed to Dr. Derrell Lolling. [facility] will come out tomorrow for repeat BMP, will fax those to Dr. Derrell Lolling as well. Patient took his torsemide early this am, but will not take any more until directed otherwise.  He states that he has felt for a while that the torsemide is "too strong" based on the amount of urine he is making.  He plans to discuss this with Dr. Derrell Lolling Monday.

## 2015-02-26 ENCOUNTER — Encounter: Payer: Self-pay | Admitting: Infectious Disease

## 2015-03-04 ENCOUNTER — Ambulatory Visit (INDEPENDENT_AMBULATORY_CARE_PROVIDER_SITE_OTHER): Payer: Medicare Other | Admitting: Infectious Disease

## 2015-03-04 ENCOUNTER — Encounter: Payer: Self-pay | Admitting: Infectious Disease

## 2015-03-04 VITALS — BP 127/79 | HR 67 | Temp 97.5°F | Wt 226.2 lb

## 2015-03-04 DIAGNOSIS — R188 Other ascites: Secondary | ICD-10-CM

## 2015-03-04 DIAGNOSIS — A4901 Methicillin susceptible Staphylococcus aureus infection, unspecified site: Secondary | ICD-10-CM | POA: Diagnosis not present

## 2015-03-04 DIAGNOSIS — R7881 Bacteremia: Secondary | ICD-10-CM

## 2015-03-04 DIAGNOSIS — Z96649 Presence of unspecified artificial hip joint: Secondary | ICD-10-CM

## 2015-03-04 DIAGNOSIS — M4624 Osteomyelitis of vertebra, thoracic region: Secondary | ICD-10-CM

## 2015-03-04 DIAGNOSIS — T8459XD Infection and inflammatory reaction due to other internal joint prosthesis, subsequent encounter: Secondary | ICD-10-CM | POA: Diagnosis not present

## 2015-03-04 DIAGNOSIS — B9561 Methicillin susceptible Staphylococcus aureus infection as the cause of diseases classified elsewhere: Secondary | ICD-10-CM

## 2015-03-04 DIAGNOSIS — M4645 Discitis, unspecified, thoracolumbar region: Secondary | ICD-10-CM

## 2015-03-04 DIAGNOSIS — T827XXD Infection and inflammatory reaction due to other cardiac and vascular devices, implants and grafts, subsequent encounter: Secondary | ICD-10-CM | POA: Diagnosis not present

## 2015-03-04 DIAGNOSIS — M4805 Spinal stenosis, thoracolumbar region: Secondary | ICD-10-CM

## 2015-03-04 NOTE — Progress Notes (Signed)
Chief complaint: lower back pain and mid back pain is worsening signficantly  Subjective:    Patient ID: [name], male    DOB: 1930/02/28, 82 y.o.   MRN: [id number]  HPI   82 y.o. male with Pacemaker, Right prosthetic hip, Laminectomy in January of 2014 admitted in August 2014 with right leg pain weakness fevers. He has been found to have MSSA bacteremia and a large retroperitoneal abscess that is adjacent to his prosthetic hip.  Orthopedics performed resection of Radical irrigation and debridement right total hip with removal of all synovium, revision of femoral head, revision of polyethylene liner placement On 09/02/12  TEE failed to show vegetations on valves or overtly on PM. PM is out. He returned to the hospital with dislocation of his hip. This was able to be reduced without surgical intervention. My partner Dr. Johnnye Sima saw the patient and noted his sedimentation rate and C-reactive protein is still be quite high. Patient completed a course of  8 weeks of cefazolin 2 g IV every 8 hours at the skilled nursing facility along with  oral rifampin twice daily.   I changed him to oral Keflex when his IV antibiotics were stopped and he continued on high-dose Keflex with rifampin since then.   He had recurrent hip dislocations since then.  He was readmitted in late October 2014 and underwent  Irrigation and debridement of infected right total hip with removal of all synovium, revision of femoral head to a +5 40 mm metal head, revision of polyethylene liner to a constrained liner  He had remained on keflex 539m po tid in the interim but stopped the rifampin.  His hip pain  dissapeared but he then developed pain in the right leg itself and difficulty raising leg vs gravity though he can bear weight on it in fall of 2015.  Since then he has experienced recurrence of MSSA infeciton of his hip requiring  Radical irrigation and debridement of infected hybrid right total hip with removal of  all synovium, removal of all components including cement going down the femur and the distal cement restrictor Hemovac drains. Placement of acetabular polymethylmethacrylate spacer   By Dr. RMayer Camelon July 16th, 2016.  intraop cultures yielded no organism but he was on keflex (and rifampin prior to surgery). He had  been placed back on Ancef IV and then transitioned back to oral keflex currently at 1 g po bid.  This November we found that he had  recurrence of pain in his thoracic region including his ribs. He underwent a CTA that did not show a PE on 11/26/14. He was not having fevers but his pain in his ribs and lower back is much worse.  We obtained MRI with sedation which showed:  Acute discitis osteomyelitis at T9-T10, T12-L1. Early discitis at T8-T9 and L4-L5, but no epidural or paraspinal abscess.  We had him urgently admitted to the hospital and had the antibiotics held. IR aspirate from the disk yielded no organisms and he was restarted on IV ancef. His back pain had improved substantially since then typically worse when getting up from supine position when I last saw him.  HOWEVER NOW he relates a story of worsening back pain since I last saw him. It is especially problematic in the am when he attempts to get up from supine position and he has "Tsunami of pain."  His ESR was completely normal last week and CRP also normalizing.   I offered repeat MRI now but he would  prefer to wait esp since he requires conscious sedation due to the pain.   Past Medical History  Diagnosis Date  . Atrial fibrillation ([place])   . Hypertension   . Pacemaker   . Arthritis   . Anxiety   . Depression   . MSSA (methicillin susceptible Staphylococcus aureus) infection   . Bacteremia   . 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 Septic hip ([place])   . Complication of anesthesia     " Terrible Nightmares"  . Dysrhythmia     Afib- had pacemaker, had it removed- takes Aspirin 325- daily  . Diabetes mellitus without  complication ([place])     Type II  . Constipation   . History of blood product transfusion   . OAB (overactive bladder)   . CKD (chronic kidney disease)   . Hypothyroidism   . Thoracic spine pain 11/28/2014    Past Surgical History  Procedure Laterality Date  . Pacemaker insertion    . Lumbar laminectomy/decompression microdiscectomy  02/14/2012    Procedure: LUMBAR LAMINECTOMY/DECOMPRESSION MICRODISCECTOMY 1 LEVEL;  Surgeon: Eustace Moore, MD;  Location: [facility];  Service: Neurosurgery;  Laterality: N/A;  Thoracic twelve - Lumbar one decompressive laminectomy.  Darden Dates without cardioversion N/A 08/30/2012    Procedure: TRANSESOPHAGEAL ECHOCARDIOGRAM (TEE);  Surgeon: Larey Dresser, MD;  Location: [facility];  Service: Cardiovascular;  Laterality: N/A;  . Icd lead removal Left 08/31/2012    Procedure: ICD LEAD REMOVAL;  Surgeon: Evans Lance, MD;  Location: [facility];  Service: Cardiovascular;  Laterality: Left;  . Incision and drainage hip Right 09/02/2012    Procedure: IRRIGATION AND DEBRIDEMENT RIGHT HIP;  Surgeon: Kerin Salen, MD;  Location: [facility];  Service: Orthopedics;  Laterality: Right;  . Total hip revision Right 09/02/2012    Procedure: REVISION OF POLYETHYLENE LINER AND FEMORAL HEAD ;  Surgeon: Kerin Salen, MD;  Location: [facility];  Service: Orthopedics;  Laterality: Right;  . Hip closed reduction Right 10/02/2012    Procedure: CLOSED REDUCTION HIP;  Surgeon: Hessie Dibble, MD;  Location: [facility];  Service: Orthopedics;  Laterality: Right;  . Total hip revision Right 11/28/2012    Procedure: TOTAL HIP REVISION With Placement of Contrain Liner;  Surgeon: Kerin Salen, MD;  Location: [facility];  Service: Orthopedics;  Laterality: Right;  . Colonoscopy w/ polypectomy      small  . Incision and drainage hip Right 08/10/2014    Procedure: IRRIGATION AND DEBRIDEMENT RIGHT HIP SUPERFICIAL VS DEEP;  Surgeon: Frederik Pear, MD;  Location: [facility];  Service: Orthopedics;  Laterality: Right;  .  Tonsillectomy    . Eye surgery      bilateral  . Joint replacement    . Radiology with anesthesia N/A 01/10/2015    Procedure: MRI LUMBAR SPINE WITH/WITHOUT   (RADIOLOGY WITH ANESTHESIA);  Surgeon: Medication Radiologist, MD;  Location: [facility];  Service: Radiology;  Laterality: N/A;    No family history on file.    Social History   Social History  . Marital Status: Married    Spouse Name: N/A  . Number of Children: N/A  . Years of Education: N/A   Social History Main Topics  . Smoking status: Never Smoker   . Smokeless tobacco: Never Used  . Alcohol Use: No  . Drug Use: No  . Sexual Activity: Not on file   Other Topics Concern  . Not on file   Social History Narrative    Allergies  Allergen  Reactions  . Ambien [Zolpidem] Other (See Comments)    hallucinations     Current outpatient prescriptions:  .  acetaminophen (TYLENOL) 500 MG tablet, Take 750 mg by mouth daily as needed (pain)., Disp: , Rfl:  .  ALPRAZolam (XANAX) 0.5 MG tablet, Take 0.5 mg by mouth daily as needed for anxiety. , Disp: , Rfl:  .  aspirin EC 325 MG tablet, Take 1 tablet (325 mg total) by mouth 2 (two) times daily. (Patient taking differently: Take 325 mg by mouth daily. ), Disp: 30 tablet, Rfl: 0 .  ceFAZolin (ANCEF) 2-3 GM-% SOLR, Inject 50 mLs (2 g total) into the vein every 8 (eight) hours. For 6 weeks from start: Till January 24th. PICC Care Per Protocol: Labs weekly while on IV antibiotics: _x CBC with differential x CMP x_ CRP -- only on last wk of abtx January 24th  _x_ ESR --- only on last wk of abtx January 24th Fax weekly labs to [phone], Disp: 6300 mL, Rfl: 0 .  diltiazem (CARDIZEM CD) 120 MG 24 hr capsule, Take 120 mg by mouth daily., Disp: , Rfl:  .  docusate sodium 100 MG CAPS, Take 100 mg by mouth 2 (two) times daily., Disp: 10 capsule, Rfl: 0 .  ferrous sulfate 325 (65 FE) MG tablet, Take 325 mg by mouth daily with breakfast., Disp: , Rfl:  .  finasteride (PROSCAR) 5 MG  tablet, Take 5 mg by mouth at bedtime. , Disp: , Rfl:  .  HYDROcodone-acetaminophen (NORCO/VICODIN) 5-325 MG tablet, Take 1 tablet by mouth 2 (two) times daily as needed (pain)., Disp: , Rfl:  .  lactulose (CHRONULAC) 10 GM/15ML solution, Take 20 g by mouth daily., Disp: , Rfl:  .  levothyroxine (SYNTHROID, LEVOTHROID) 88 MCG tablet, Take 88 mcg by mouth daily before breakfast., Disp: , Rfl:  .  lovastatin (MEVACOR) 40 MG tablet, Take 40 mg by mouth at bedtime. , Disp: , Rfl:  .  metolazone (ZAROXOLYN) 5 MG tablet, Take 2.5 mg by mouth every Monday, Wednesday, and Friday. , Disp: , Rfl:  .  metoprolol succinate (TOPROL-XL) 25 MG 24 hr tablet, Take 12.5 mg by mouth daily., Disp: , Rfl:  .  ONE TOUCH ULTRA TEST test strip, , Disp: , Rfl:  .  oxyCODONE (OXY IR/ROXICODONE) 5 MG immediate release tablet, Take 5 mg by mouth daily as needed for severe pain (pain)., Disp: , Rfl:  .  pioglitazone (ACTOS) 15 MG tablet, Take 15 mg by mouth daily. , Disp: , Rfl:  .  potassium chloride SA (K-DUR,KLOR-CON) 20 MEQ tablet, Take 1 tablet (20 mEq total) by mouth daily. (Patient taking differently: Take 20 mEq by mouth 2 (two) times daily. ), Disp: 3 tablet, Rfl: 0 .  Probiotic Product (PROBIOTIC DAILY PO), Take 1 capsule by mouth daily. , Disp: , Rfl:  .  terazosin (HYTRIN) 5 MG capsule, Take 5 mg by mouth at bedtime. , Disp: , Rfl:  .  tiZANidine (ZANAFLEX) 2 MG tablet, Take 1 tablet (2 mg total) by mouth every 6 (six) hours as needed for muscle spasms., Disp: 60 tablet, Rfl: 0 .  torsemide (DEMADEX) 20 MG tablet, Take 20 mg by mouth daily. , Disp: , Rfl:    Review of Systems  Constitutional: Negative for chills, diaphoresis, activity change, appetite change, fatigue and unexpected weight change.  HENT: Negative for sore throat.   Eyes: Negative for visual disturbance.  Respiratory: Negative for cough and chest tightness.   Gastrointestinal: Negative for  nausea, vomiting, abdominal pain, diarrhea, constipation,  blood in stool, abdominal distention and anal bleeding.  Genitourinary: Negative for dysuria, hematuria, flank pain and difficulty urinating.  Musculoskeletal: Positive for myalgias, back pain, arthralgias and gait problem. Negative for joint swelling.  Skin: Negative for color change, pallor, rash and wound.  Neurological: Positive for weakness. Negative for dizziness, tremors and light-headedness.  Hematological: Negative for adenopathy. Does not bruise/bleed easily.  Psychiatric/Behavioral: Negative for behavioral problems, confusion, sleep disturbance, dysphoric mood, decreased concentration and agitation.       Objective:   Physical Exam  Constitutional: He is oriented to person, place, and time. He appears well-developed and well-nourished. No distress.  HENT:  Head: Normocephalic and atraumatic.  Mouth/Throat: Oropharynx is clear and moist. No oropharyngeal exudate.  Eyes: Conjunctivae and EOM are normal. No scleral icterus.  Neck: Normal range of motion. Neck supple.  Cardiovascular: Normal rate, regular rhythm and normal heart sounds.   Pulmonary/Chest: Effort normal and breath sounds normal. No respiratory distress. He has no wheezes. He has no rales. He exhibits no tenderness.  Abdominal: He exhibits no distension. There is tenderness. There is no rebound and no guarding.  Musculoskeletal: He exhibits no tenderness.       Back:  Neurological: He is alert and oriented to person, place, and time. He exhibits normal muscle tone.  Skin: Skin is warm and dry. He is not diaphoretic. No erythema. No pallor.  Psychiatric: He has a normal mood and affect. His behavior is normal. Judgment and thought content normal.  Nursing note and vitals reviewed.        Assessment & Plan:   MSSA bacteremia and a large retroperitoneal abscess prosthetic hip infection, sp resection removal of pacemaker, and sp radical irrigation and debridement right total hip with removal of all synovium,  revision of femoral head, revision of polyethylene liner placement 2 years ago and now recurrence with infection sp Radical irrigation and debridement of infected hybrid right total hip with removal of all synovium, removal of all components including cement going down the femur and the distal cement restrictor Hemovac drains. Placement of acetabular polymethylmethacrylate spacer sp  Nearly 6 weeks of IV cefazolin now on protracted keflex but with new diskitis of the T spine sp aspiration which seems to be responding to IV ancef and likely due to the original culprit MSSA  --finish 8 week course of IV ancef and then go back to high dose keflex  --RTC in 6 weeks --If pain worsening will get repeat MRI  I spent greater than 40  minutes with the patient including greater than 50% of time in face to face counsel of the patient regarding his MSSA bacteremia pacemaker infection retroperitoneal abscess and recurrent prosthetic hip infection, thoracic spine, diskiits  and in coordination of his care.

## 2015-03-05 ENCOUNTER — Telehealth: Payer: Self-pay | Admitting: *Deleted

## 2015-03-05 NOTE — Telephone Encounter (Signed)
Labs received from [facility], patient's hemoglobin is 10.7, RBC 3.41, platelets 142, and hematocrit 32.4. Results placed in Dr. [name]'s inbox. [name]

## 2015-04-09 ENCOUNTER — Encounter: Payer: Self-pay | Admitting: Infectious Disease

## 2015-04-16 ENCOUNTER — Ambulatory Visit (INDEPENDENT_AMBULATORY_CARE_PROVIDER_SITE_OTHER): Payer: Medicare Other | Admitting: Infectious Disease

## 2015-04-16 ENCOUNTER — Encounter: Payer: Self-pay | Admitting: Infectious Disease

## 2015-04-16 VITALS — BP 141/77 | HR 58 | Temp 97.9°F | Ht 71.5 in | Wt 229.0 lb

## 2015-04-16 DIAGNOSIS — Z96649 Presence of unspecified artificial hip joint: Secondary | ICD-10-CM

## 2015-04-16 DIAGNOSIS — T827XXD Infection and inflammatory reaction due to other cardiac and vascular devices, implants and grafts, subsequent encounter: Secondary | ICD-10-CM

## 2015-04-16 DIAGNOSIS — T8459XD Infection and inflammatory reaction due to other internal joint prosthesis, subsequent encounter: Secondary | ICD-10-CM

## 2015-04-16 DIAGNOSIS — B9561 Methicillin susceptible Staphylococcus aureus infection as the cause of diseases classified elsewhere: Secondary | ICD-10-CM

## 2015-04-16 DIAGNOSIS — R7881 Bacteremia: Secondary | ICD-10-CM

## 2015-04-16 DIAGNOSIS — M4645 Discitis, unspecified, thoracolumbar region: Secondary | ICD-10-CM

## 2015-04-16 DIAGNOSIS — M869 Osteomyelitis, unspecified: Secondary | ICD-10-CM

## 2015-04-16 DIAGNOSIS — M4624 Osteomyelitis of vertebra, thoracic region: Secondary | ICD-10-CM

## 2015-04-16 DIAGNOSIS — S73004D Unspecified dislocation of right hip, subsequent encounter: Secondary | ICD-10-CM

## 2015-04-16 DIAGNOSIS — R188 Other ascites: Secondary | ICD-10-CM

## 2015-04-16 DIAGNOSIS — A4901 Methicillin susceptible Staphylococcus aureus infection, unspecified site: Secondary | ICD-10-CM | POA: Diagnosis not present

## 2015-04-16 MED ORDER — CEPHALEXIN 500 MG PO CAPS: 1000.0000 mg | ORAL_CAPSULE | Freq: Two times a day (BID) | ORAL | Status: DC

## 2015-04-16 NOTE — Progress Notes (Signed)
Chief complaint: lower back pain and mid back pain worse when he transfers from sleeping to prone position  Subjective:    Patient ID: [name], male    DOB: 1930/06/01, 82 y.o.   MRN: [id number]  HPI   82 y.o. male with Pacemaker, Right prosthetic hip, Laminectomy in January of 2014 admitted in August 2014 with right leg pain weakness fevers. He has been found to have MSSA bacteremia and a large retroperitoneal abscess that is adjacent to his prosthetic hip.  Orthopedics performed resection of Radical irrigation and debridement right total hip with removal of all synovium, revision of femoral head, revision of polyethylene liner placement On 09/02/12  TEE failed to show vegetations on valves or overtly on PM. PM is out. He returned to the hospital with dislocation of his hip. This was able to be reduced without surgical intervention. My partner Dr. Johnnye Sima saw the patient and noted his sedimentation rate and C-reactive protein is still be quite high. Patient completed a course of  8 weeks of cefazolin 2 g IV every 8 hours at the skilled nursing facility along with  oral rifampin twice daily.   I changed him to oral Keflex when his IV antibiotics were stopped and he continued on high-dose Keflex with rifampin since then.   He had recurrent hip dislocations since then.  He was readmitted in late October 2014 and underwent  Irrigation and debridement of infected right total hip with removal of all synovium, revision of femoral head to a +5 40 mm metal head, revision of polyethylene liner to a constrained liner  He had remained on keflex 57m po tid in the interim but stopped the rifampin.  His hip pain  dissapeared but he then developed pain in the right leg itself and difficulty raising leg vs gravity though he can bear weight on it in fall of 2015.  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 Placement of acetabular polymethylmethacrylate spacer   By Dr. RMayer Camelon July 16th, 2016.  intraop cultures yielded no organism but he was on keflex (and rifampin prior to surgery). He had  been placed back on Ancef IV and then transitioned back to oral keflex currently at 1 g po bid.  This November we found that he had  recurrence of pain in his thoracic region including his ribs. He underwent a CTA that did not show a PE on 11/26/14. He was not having fevers but his pain in his ribs and lower back is much worse.  We obtained MRI with sedation which showed:  Acute discitis osteomyelitis at T9-T10, T12-L1. Early discitis at T8-T9 and L4-L5, but no epidural or paraspinal abscess.  We had him urgently admitted to the hospital and had the antibiotics held. IR aspirate from the disk yielded no organisms and he was restarted on IV ancef. His back pain had intiially  improved substantially since then typically worse when getting up from supine position..Marland Kitchen He then had worsening back pain since I last saw him.  It haen s beespecially problematic in the am when he attempts to get up from supine position and he has "Tsunami of pain."  His ESR was completely normal last week  last checked and CRP was normalizing.  I offered repeat MRI now  but he would prefered to wait esp since he requires conscious sedation due to the pain.  Since I last saw him he has continued to suffer form Tsunami of pain when changing position. He tells me that this was less of an issue when he had the PICC line but that handling the PICC line forced him to move more carefully.  He is without fevers, nausea or malaise.    Past Medical History  Diagnosis Date  . Atrial fibrillation ([place])   . Hypertension   . Pacemaker   . Arthritis   . Anxiety   . Depression   .  MSSA (methicillin susceptible Staphylococcus aureus) infection   . Bacteremia   . 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 Septic hip ([place])   . Complication of anesthesia     " Terrible Nightmares"  . Dysrhythmia     Afib- had pacemaker, had it removed- takes Aspirin 325- daily  . Diabetes mellitus without complication ([place])     Type II  . Constipation   . History of blood product transfusion   . OAB (overactive bladder)   . CKD (chronic kidney disease)   . Hypothyroidism   . Thoracic spine pain 11/28/2014    Past Surgical History  Procedure Laterality Date  . Pacemaker insertion    . Lumbar laminectomy/decompression microdiscectomy  02/14/2012    Procedure: LUMBAR LAMINECTOMY/DECOMPRESSION MICRODISCECTOMY 1 LEVEL;  Surgeon: Eustace Moore, MD;  Location: [facility];  Service: Neurosurgery;  Laterality: N/A;  Thoracic twelve - Lumbar one decompressive laminectomy.  Darden Dates without cardioversion N/A 08/30/2012    Procedure: TRANSESOPHAGEAL ECHOCARDIOGRAM (TEE);  Surgeon: Larey Dresser, MD;  Location: [facility];  Service: Cardiovascular;  Laterality: N/A;  . Icd lead removal Left 08/31/2012    Procedure: ICD LEAD REMOVAL;  Surgeon: Evans Lance, MD;  Location: [facility];  Service: Cardiovascular;  Laterality: Left;  . Incision and drainage hip Right 09/02/2012    Procedure: IRRIGATION AND DEBRIDEMENT RIGHT HIP;  Surgeon: Kerin Salen, MD;  Location: [facility];  Service: Orthopedics;  Laterality: Right;  . Total hip revision Right 09/02/2012    Procedure: REVISION OF POLYETHYLENE LINER AND FEMORAL HEAD ;  Surgeon: Kerin Salen, MD;  Location: [facility];  Service: Orthopedics;  Laterality: Right;  . Hip closed reduction Right 10/02/2012    Procedure: CLOSED REDUCTION HIP;  Surgeon: Hessie Dibble, MD;  Location: [facility];  Service: Orthopedics;  Laterality: Right;  . Total hip revision Right 11/28/2012    Procedure: TOTAL HIP REVISION With Placement of Contrain Liner;  Surgeon: Kerin Salen, MD;  Location: [facility];   Service: Orthopedics;  Laterality: Right;  . Colonoscopy w/ polypectomy      small  . Incision and drainage hip Right 08/10/2014    Procedure: IRRIGATION AND DEBRIDEMENT RIGHT HIP SUPERFICIAL VS DEEP;  Surgeon: Frederik Pear, MD;  Location: [facility];  Service: Orthopedics;  Laterality: Right;  . Tonsillectomy    . Eye surgery      bilateral  . Joint replacement    . Radiology with anesthesia N/A 01/10/2015    Procedure: MRI LUMBAR SPINE WITH/WITHOUT   (RADIOLOGY WITH ANESTHESIA);  Surgeon: Medication Radiologist, MD;  Location: [facility];  Service: Radiology;  Laterality: N/A;    No family history on file.    Social History   Social History  . Marital Status: Married    Spouse Name: N/A  . Number of Children: N/A  . Years of Education:  N/A   Social History Main Topics  . Smoking status: Never Smoker   . Smokeless tobacco: Never Used  . Alcohol Use: No  . Drug Use: No  . Sexual Activity: Not Asked   Other Topics Concern  . None   Social History Narrative    Allergies  Allergen Reactions  . Ambien [Zolpidem] Other (See Comments)    hallucinations     Current outpatient prescriptions:  .  acetaminophen (TYLENOL) 500 MG tablet, Take 750 mg by mouth daily as needed (pain)., Disp: , Rfl:  .  ALPRAZolam (XANAX) 0.5 MG tablet, Take 0.5 mg by mouth daily as needed for anxiety. , Disp: , Rfl:  .  aspirin EC 325 MG tablet, Take 1 tablet (325 mg total) by mouth 2 (two) times daily. (Patient taking differently: Take 325 mg by mouth daily. ), Disp: 30 tablet, Rfl: 0 .  diltiazem (CARDIZEM CD) 120 MG 24 hr capsule, Take 120 mg by mouth daily., Disp: , Rfl:  .  docusate sodium 100 MG CAPS, Take 100 mg by mouth 2 (two) times daily., Disp: 10 capsule, Rfl: 0 .  ferrous sulfate 325 (65 FE) MG tablet, Take 325 mg by mouth daily with breakfast., Disp: , Rfl:  .  finasteride (PROSCAR) 5 MG tablet, Take 5 mg by mouth at bedtime. , Disp: , Rfl:  .  HYDROcodone-acetaminophen (NORCO/VICODIN) 5-325 MG  tablet, Take 1 tablet by mouth 2 (two) times daily as needed (pain)., Disp: , Rfl:  .  lactulose (CHRONULAC) 10 GM/15ML solution, Take 20 g by mouth daily., Disp: , Rfl:  .  levothyroxine (SYNTHROID, LEVOTHROID) 88 MCG tablet, Take 88 mcg by mouth daily before breakfast., Disp: , Rfl:  .  lovastatin (MEVACOR) 40 MG tablet, Take 40 mg by mouth at bedtime. Reported on 04/16/2015, Disp: , Rfl:  .  metoprolol succinate (TOPROL-XL) 25 MG 24 hr tablet, Take 12.5 mg by mouth daily., Disp: , Rfl:  .  ONE TOUCH ULTRA TEST test strip, , Disp: , Rfl:  .  pioglitazone (ACTOS) 15 MG tablet, Take 15 mg by mouth daily. , Disp: , Rfl:  .  potassium chloride SA (K-DUR,KLOR-CON) 20 MEQ tablet, Take 1 tablet (20 mEq total) by mouth daily. (Patient taking differently: Take 20 mEq by mouth 2 (two) times daily. ), Disp: 3 tablet, Rfl: 0 .  Probiotic Product (PROBIOTIC DAILY PO), Take 1 capsule by mouth daily. , Disp: , Rfl:  .  terazosin (HYTRIN) 5 MG capsule, Take 5 mg by mouth at bedtime. , Disp: , Rfl:  .  tiZANidine (ZANAFLEX) 2 MG tablet, Take 1 tablet (2 mg total) by mouth every 6 (six) hours as needed for muscle spasms., Disp: 60 tablet, Rfl: 0 .  torsemide (DEMADEX) 20 MG tablet, Take 20 mg by mouth daily. , Disp: , Rfl:  .  cephALEXin (KEFLEX) 500 MG capsule, Take 2 capsules (1,000 mg total) by mouth 2 (two) times daily., Disp: 60 capsule, Rfl: 11 .  metolazone (ZAROXOLYN) 5 MG tablet, Take 2.5 mg by mouth every Monday, Wednesday, and Friday. Reported on 04/16/2015, Disp: , Rfl:  .  oxyCODONE (OXY IR/ROXICODONE) 5 MG immediate release tablet, Take 5 mg by mouth daily as needed for severe pain (pain). Reported on 04/16/2015, Disp: , Rfl:    Review of Systems  Constitutional: Negative for chills, diaphoresis, activity change, appetite change, fatigue and unexpected weight change.  HENT: Negative for sore throat.   Eyes: Negative for visual disturbance.  Respiratory: Negative for cough  and chest tightness.    Gastrointestinal: Negative for nausea, vomiting, abdominal pain, diarrhea, constipation, blood in stool, abdominal distention and anal bleeding.  Genitourinary: Negative for dysuria, hematuria, flank pain and difficulty urinating.  Musculoskeletal: Positive for myalgias, back pain, arthralgias and gait problem. Negative for joint swelling.  Skin: Negative for color change, pallor, rash and wound.  Neurological: Positive for weakness. Negative for dizziness, tremors and light-headedness.  Hematological: Negative for adenopathy. Does not bruise/bleed easily.  Psychiatric/Behavioral: Negative for behavioral problems, confusion, sleep disturbance, dysphoric mood, decreased concentration and agitation.       Objective:   Physical Exam  Constitutional: He is oriented to person, place, and time. He appears well-developed and well-nourished. No distress.  HENT:  Head: Normocephalic and atraumatic.  Mouth/Throat: Oropharynx is clear and moist. No oropharyngeal exudate.  Eyes: Conjunctivae and EOM are normal. No scleral icterus.  Neck: Normal range of motion. Neck supple.  Cardiovascular: Normal rate, regular rhythm and normal heart sounds.   Pulmonary/Chest: Effort normal and breath sounds normal. No respiratory distress. He has no wheezes. He has no rales. He exhibits no tenderness.  Abdominal: He exhibits no distension. There is no rebound and no guarding.  Musculoskeletal: He exhibits no tenderness.  Neurological: He is alert and oriented to person, place, and time. He exhibits normal muscle tone.  Skin: Skin is warm and dry. He is not diaphoretic. No erythema. No pallor.  Psychiatric: He has a normal mood and affect. His behavior is normal. Judgment and thought content normal.  Nursing note and vitals reviewed.        Assessment & Plan:   MSSA bacteremia and a large retroperitoneal abscess prosthetic hip infection, sp resection removal of pacemaker, and sp radical irrigation and  debridement right total hip with removal of all synovium, revision of femoral head, revision of polyethylene liner placement 2 years ago and now recurrence with infection sp Radical irrigation and debridement of infected hybrid right total hip with removal of all synovium, removal of all components including cement going down the femur and the distal cement restrictor Hemovac drains. Placement of acetabular polymethylmethacrylate spacer sp  Nearly 6 weeks of IV cefazolin now on protracted keflex but with new diskitis of the T spine sp aspiration which seemed  to be responding to IV ancef and likely due to the original culprit MSSA, He has now finished his 8 weeks of IV ancef and on keflex 1 gram in am and 500 mg in the pm  --I still have high anxiety about him but would prefer not yet to push for repeat MRI  --continue high dose keflex and RTC in the next 2 months  I spent greater than 25  minutes with the patient including greater than 50% of time in face to face counsel of the patient regarding his MSSA bacteremia pacemaker infection retroperitoneal abscess and recurrent prosthetic hip infection, thoracic spine, diskiits  and in coordination of his care.

## 2015-04-17 LAB — C-REACTIVE PROTEIN: CRP: 0.5 mg/dL (ref <0.60)

## 2015-04-17 LAB — SEDIMENTATION RATE: Sed Rate: 20 mm/hr (ref 0–20)

## 2015-04-17 LAB — BASIC METABOLIC PANEL WITH GFR
BUN: 20 mg/dL (ref 7–25)
CALCIUM: 8.8 mg/dL (ref 8.6–10.3)
CO2: 25 mmol/L (ref 20–31)
CREATININE: 1.27 mg/dL — AB (ref 0.70–1.11)
Chloride: 107 mmol/L (ref 98–110)
GFR, EST AFRICAN AMERICAN: 60 mL/min (ref >=60)
GFR, EST NON AFRICAN AMERICAN: 52 mL/min — AB (ref >=60)
Glucose, Bld: 118 mg/dL — ABNORMAL HIGH (ref 65–99)
Potassium: 4.1 mmol/L (ref 3.5–5.3)
Sodium: 144 mmol/L (ref 135–146)

## 2015-06-17 ENCOUNTER — Ambulatory Visit (INDEPENDENT_AMBULATORY_CARE_PROVIDER_SITE_OTHER): Payer: Medicare Other | Admitting: Infectious Disease

## 2015-06-17 ENCOUNTER — Encounter: Payer: Self-pay | Admitting: Infectious Disease

## 2015-06-17 VITALS — BP 134/81 | HR 51 | Temp 97.8°F

## 2015-06-17 DIAGNOSIS — M25511 Pain in right shoulder: Secondary | ICD-10-CM

## 2015-06-17 DIAGNOSIS — A4901 Methicillin susceptible Staphylococcus aureus infection, unspecified site: Secondary | ICD-10-CM | POA: Diagnosis not present

## 2015-06-17 DIAGNOSIS — R29898 Other symptoms and signs involving the musculoskeletal system: Secondary | ICD-10-CM

## 2015-06-17 DIAGNOSIS — M4624 Osteomyelitis of vertebra, thoracic region: Secondary | ICD-10-CM

## 2015-06-17 DIAGNOSIS — F4321 Adjustment disorder with depressed mood: Secondary | ICD-10-CM

## 2015-06-17 DIAGNOSIS — T827XXD Infection and inflammatory reaction due to other cardiac and vascular devices, implants and grafts, subsequent encounter: Secondary | ICD-10-CM

## 2015-06-17 DIAGNOSIS — F322 Major depressive disorder, single episode, severe without psychotic features: Secondary | ICD-10-CM

## 2015-06-17 DIAGNOSIS — R7881 Bacteremia: Secondary | ICD-10-CM

## 2015-06-17 DIAGNOSIS — I482 Chronic atrial fibrillation, unspecified: Secondary | ICD-10-CM

## 2015-06-17 DIAGNOSIS — T8459XD Infection and inflammatory reaction due to other internal joint prosthesis, subsequent encounter: Secondary | ICD-10-CM | POA: Diagnosis not present

## 2015-06-17 DIAGNOSIS — N183 Chronic kidney disease, stage 3 unspecified: Secondary | ICD-10-CM

## 2015-06-17 DIAGNOSIS — M4645 Discitis, unspecified, thoracolumbar region: Secondary | ICD-10-CM

## 2015-06-17 DIAGNOSIS — R45851 Suicidal ideations: Secondary | ICD-10-CM

## 2015-06-17 DIAGNOSIS — G8929 Other chronic pain: Secondary | ICD-10-CM

## 2015-06-17 DIAGNOSIS — S73004D Unspecified dislocation of right hip, subsequent encounter: Secondary | ICD-10-CM

## 2015-06-17 DIAGNOSIS — E118 Type 2 diabetes mellitus with unspecified complications: Secondary | ICD-10-CM

## 2015-06-17 DIAGNOSIS — F329 Major depressive disorder, single episode, unspecified: Secondary | ICD-10-CM

## 2015-06-17 DIAGNOSIS — B9561 Methicillin susceptible Staphylococcus aureus infection as the cause of diseases classified elsewhere: Secondary | ICD-10-CM

## 2015-06-17 DIAGNOSIS — Z96649 Presence of unspecified artificial hip joint: Secondary | ICD-10-CM

## 2015-06-17 HISTORY — DX: Adjustment disorder with depressed mood: F43.21

## 2015-06-17 HISTORY — DX: Major depressive disorder, single episode, unspecified: F32.9

## 2015-06-17 HISTORY — DX: Other chronic pain: G89.29

## 2015-06-17 LAB — COMPLETE METABOLIC PANEL WITH GFR
ALT: 12 U/L (ref 9–46)
AST: 19 U/L (ref 10–35)
Albumin: 3.6 g/dL (ref 3.6–5.1)
Alkaline Phosphatase: 91 U/L (ref 40–115)
BILIRUBIN TOTAL: 0.5 mg/dL (ref 0.2–1.2)
BUN: 19 mg/dL (ref 7–25)
CHLORIDE: 108 mmol/L (ref 98–110)
CO2: 27 mmol/L (ref 20–31)
Calcium: 8.7 mg/dL (ref 8.6–10.3)
Creat: 1.24 mg/dL — ABNORMAL HIGH (ref 0.70–1.11)
GFR, EST NON AFRICAN AMERICAN: 53 mL/min — AB (ref >=60)
GFR, Est African American: 62 mL/min (ref >=60)
GLUCOSE: 79 mg/dL (ref 65–99)
Potassium: 4.3 mmol/L (ref 3.5–5.3)
SODIUM: 143 mmol/L (ref 135–146)
TOTAL PROTEIN: 6.3 g/dL (ref 6.1–8.1)

## 2015-06-17 LAB — C-REACTIVE PROTEIN

## 2015-06-17 LAB — SEDIMENTATION RATE: SED RATE: 12 mm/h (ref 0–20)

## 2015-06-17 NOTE — Progress Notes (Signed)
Chief complaint: lower back pain and mid back pain worse when he transfers from sleeping to prone position also with signficant depressive symptoms  Subjective:    Patient ID: [name], male    DOB: 1930/11/30, 82 y.o.   MRN: [id number]  HPI   82 year old male with Pacemaker, Right prosthetic hip, Laminectomy in January of 2014 admitted in August 2014 with right leg pain weakness fevers. He has been found to have MSSA bacteremia and a large retroperitoneal abscess that is adjacent to his prosthetic hip.  Orthopedics performed resection of Radical irrigation and debridement right total hip with removal of all synovium, revision of femoral head, revision of polyethylene liner placement On 09/02/12  TEE failed to show vegetations on valves or overtly on PM. PM is out. He returned to the hospital with dislocation of his hip. This was able to be reduced without surgical intervention. My partner Dr. Johnnye Sima saw the patient and noted his sedimentation rate and C-reactive protein is still be quite high. Patient completed a course of  8 weeks of cefazolin 2 g IV every 8 hours at the skilled nursing facility along with  oral rifampin twice daily.   I changed him to oral Keflex when his IV antibiotics were stopped and he continued on high-dose Keflex with rifampin since then.   He had recurrent hip dislocations since then.  He was readmitted in late October 2014 and underwent  Irrigation and debridement of infected right total hip with removal of all synovium, revision of femoral head to a +5 40 mm metal head, revision of polyethylene liner to a constrained liner  He had remained on keflex 555m po tid in the interim but stopped the rifampin.  His hip pain  dissapeared but he then developed pain in the right leg itself and difficulty raising leg vs gravity though he can bear weight on it in fall of 2015.  Since then he has experienced recurrence of MSSA infeciton of his hip requiring  Radical  irrigation and debridement of infected hybrid right total hip with removal of all synovium, removal of all components including cement going down the femur and the distal cement restrictor Hemovac drains. Placement of acetabular polymethylmethacrylate spacer   By Dr. RMayer Camelon July 16th, 2016.  intraop cultures yielded no organism but he was on keflex (and rifampin prior to surgery). He had  been placed back on Ancef IV and then transitioned back to oral keflex currently at 1 g po bid.  This November we found that he had  recurrence of pain in his thoracic region including his ribs. He underwent a CTA that did not show a PE on 11/26/14. He was not having fevers but his pain in his ribs and lower back is much worse.  We obtained MRI with sedation which showed:  Acute discitis osteomyelitis at T9-T10, T12-L1. Early discitis at T8-T9 and L4-L5, but no epidural or paraspinal abscess.  We had him urgently admitted to the hospital and had the antibiotics held. IR aspirate from the disk yielded no organisms and he was restarted on IV ancef. His back pain had intiially  improved substantially since then typically worse when getting up from supine position..Marland Kitchen He then had worsening back pain..  It has been specially problematic in the am when he attempts to get up from supine position and he has "Tsunami of pain."  His ESR was completely normal last week  last checked and CRP was normalizing.  I offered repeat MRI  now but he would prefered to wait esp since he requires conscious sedation due to the pain.  Since I last saw him he has continued to suffer form "Tsunami" of pain when changing position. He tells me that this was less of an issue when he had the PICC line but that handling the PICC line forced him to move more carefully.  He is without fevers, nausea or malaise.   Since he last saw him he again continues to have the pain described above. He also continues to have right shoulder pain that  has been chronic. He feels better being on abx. He has significant depressive ssx and feels ashamed that he cannot contribute at home much anymore and that he is reliant on his son and his wife who still works. He has thoughts of passive suicidal ideation but not active suicidal ideation. He is contracted for safety. He will talk to his PCP re his depressive ssx and potential rx with SSRI and CBT.   Past Medical History  Diagnosis Date  . Atrial fibrillation ([place])   . Hypertension   . Pacemaker   . Arthritis   . Anxiety   . Depression   . MSSA (methicillin susceptible Staphylococcus aureus) infection   . Bacteremia   . 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 Septic hip ([place])   . Complication of anesthesia     " Terrible Nightmares"  . Dysrhythmia     Afib- had pacemaker, had it removed- takes Aspirin 325- daily  . Diabetes mellitus without complication ([place])     Type II  . Constipation   . History of blood product transfusion   . OAB (overactive bladder)   . CKD (chronic kidney disease)   . Hypothyroidism   . Thoracic spine pain 11/28/2014    Past Surgical History  Procedure Laterality Date  . Pacemaker insertion    . Lumbar laminectomy/decompression microdiscectomy  02/14/2012    Procedure: LUMBAR LAMINECTOMY/DECOMPRESSION MICRODISCECTOMY 1 LEVEL;  Surgeon: Eustace Moore, MD;  Location: [facility];  Service: Neurosurgery;  Laterality: N/A;  Thoracic twelve - Lumbar one decompressive laminectomy.  Darden Dates without cardioversion N/A 08/30/2012    Procedure: TRANSESOPHAGEAL ECHOCARDIOGRAM (TEE);  Surgeon: Larey Dresser, MD;  Location: [facility];  Service: Cardiovascular;  Laterality: N/A;  . Icd lead removal Left 08/31/2012    Procedure: ICD LEAD REMOVAL;  Surgeon: Evans Lance, MD;  Location: [place];  Service: Cardiovascular;  Laterality: Left;  . Incision and drainage hip Right 09/02/2012    Procedure: IRRIGATION AND DEBRIDEMENT RIGHT HIP;  Surgeon: Kerin Salen, MD;  Location: [place];   Service: Orthopedics;  Laterality: Right;  . Total hip revision Right 09/02/2012    Procedure: REVISION OF POLYETHYLENE LINER AND FEMORAL HEAD ;  Surgeon: Kerin Salen, MD;  Location: [place];  Service: Orthopedics;  Laterality: Right;  . Hip closed reduction Right 10/02/2012    Procedure: CLOSED REDUCTION HIP;  Surgeon: Hessie Dibble, MD;  Location: [facility];  Service: Orthopedics;  Laterality: Right;  . Total hip revision Right 11/28/2012    Procedure: TOTAL HIP REVISION With Placement of Contrain Liner;  Surgeon: Kerin Salen, MD;  Location: [place] [facility];  Service: Orthopedics;  Laterality: Right;  . Colonoscopy w/ polypectomy      small  . Incision and drainage hip Right 08/10/2014    Procedure: IRRIGATION AND DEBRIDEMENT RIGHT HIP SUPERFICIAL VS DEEP;  Surgeon: Frederik Pear, MD;  Location: [facility];  Service: Orthopedics;  Laterality: Right;  . Tonsillectomy    . Eye surgery      bilateral  . Joint replacement    . Radiology with anesthesia N/A 01/10/2015    Procedure: MRI LUMBAR SPINE WITH/WITHOUT   (RADIOLOGY WITH ANESTHESIA);  Surgeon: Medication Radiologist, MD;  Location: [place];  Service: Radiology;  Laterality: N/A;    No family history on file.    Social History   Social History  . Marital Status: Married    Spouse Name: N/A  . Number of Children: N/A  . Years of Education: N/A   Social History Main Topics  . Smoking status: Never Smoker   . Smokeless tobacco: Never Used  . Alcohol Use: No  . Drug Use: No  . Sexual Activity: Not Asked   Other Topics Concern  . None   Social History Narrative    Allergies  Allergen Reactions  . Ambien [Zolpidem] Other (See Comments)    hallucinations     Current outpatient prescriptions:  .  acetaminophen (TYLENOL) 500 MG tablet, Take 750 mg by mouth daily as needed (pain)., Disp: , Rfl:  .  ALPRAZolam (XANAX) 0.5 MG tablet, Take 0.5 mg by mouth daily as needed for anxiety. , Disp: , Rfl:  .  aspirin EC 325 MG tablet, Take 1 tablet (325  mg total) by mouth 2 (two) times daily. (Patient taking differently: Take 325 mg by mouth daily. ), Disp: 30 tablet, Rfl: 0 .  cephALEXin (KEFLEX) 500 MG capsule, Take 2 capsules (1,000 mg total) by mouth 2 (two) times daily., Disp: 60 capsule, Rfl: 11 .  diltiazem (CARDIZEM CD) 120 MG 24 hr capsule, Take 120 mg by mouth daily., Disp: , Rfl:  .  docusate sodium 100 MG CAPS, Take 100 mg by mouth 2 (two) times daily., Disp: 10 capsule, Rfl: 0 .  ferrous sulfate 325 (65 FE) MG tablet, Take 325 mg by mouth daily with breakfast., Disp: , Rfl:  .  finasteride (PROSCAR) 5 MG tablet, Take 5 mg by mouth at bedtime. , Disp: , Rfl:  .  HYDROcodone-acetaminophen (NORCO/VICODIN) 5-325 MG tablet, Take 1 tablet by mouth 2 (two) times daily as needed (pain)., Disp: , Rfl:  .  lactulose (CHRONULAC) 10 GM/15ML solution, Take 20 g by mouth daily., Disp: , Rfl:  .  levothyroxine (SYNTHROID, LEVOTHROID) 88 MCG tablet, Take 88 mcg by mouth daily before breakfast., Disp: , Rfl:  .  lovastatin (MEVACOR) 40 MG tablet, Take 40 mg by mouth at bedtime. Reported on 04/16/2015, Disp: , Rfl:  .  metolazone (ZAROXOLYN) 5 MG tablet, Take 2.5 mg by mouth every Monday, Wednesday, and Friday. Reported on 04/16/2015, Disp: , Rfl:  .  metoprolol succinate (TOPROL-XL) 25 MG 24 hr tablet, Take 12.5 mg by mouth daily., Disp: , Rfl:  .  ONE TOUCH ULTRA TEST test strip, , Disp: , Rfl:  .  pioglitazone (ACTOS) 15 MG tablet, Take 15 mg by mouth daily. , Disp: , Rfl:  .  potassium chloride SA (K-DUR,KLOR-CON) 20 MEQ tablet, Take 1 tablet (20 mEq total) by mouth daily. (Patient taking differently: Take 20 mEq by mouth 2 (two) times daily. ), Disp: 3 tablet, Rfl: 0 .  Probiotic Product (PROBIOTIC DAILY PO), Take 1 capsule by mouth daily. , Disp: , Rfl:  .  terazosin (HYTRIN) 5 MG capsule, Take 5 mg by mouth at bedtime. , Disp: , Rfl:  .  tiZANidine (ZANAFLEX) 2 MG tablet, Take 1 tablet (2 mg total) by mouth every 6 (  six) hours as needed for muscle  spasms., Disp: 60 tablet, Rfl: 0 .  torsemide (DEMADEX) 20 MG tablet, Take 20 mg by mouth daily. , Disp: , Rfl:  .  oxyCODONE (OXY IR/ROXICODONE) 5 MG immediate release tablet, Take 5 mg by mouth daily as needed for severe pain (pain). Reported on 06/17/2015, Disp: , Rfl:    Review of Systems  Constitutional: Negative for chills, diaphoresis, activity change, appetite change, fatigue and unexpected weight change.  HENT: Negative for sore throat.   Eyes: Negative for visual disturbance.  Respiratory: Negative for cough and chest tightness.   Gastrointestinal: Negative for nausea, vomiting, abdominal pain, diarrhea, constipation, blood in stool, abdominal distention and anal bleeding.  Genitourinary: Negative for dysuria, hematuria, flank pain and difficulty urinating.  Musculoskeletal: Positive for myalgias, back pain, arthralgias and gait problem. Negative for joint swelling.  Skin: Negative for color change, pallor, rash and wound.  Neurological: Positive for weakness. Negative for dizziness, tremors and light-headedness.  Hematological: Negative for adenopathy. Does not bruise/bleed easily.  Psychiatric/Behavioral: Positive for suicidal ideas and dysphoric mood. Negative for behavioral problems, confusion, sleep disturbance, self-injury, decreased concentration and agitation.       Objective:   Physical Exam  Constitutional: He is oriented to person, place, and time. He appears well-developed and well-nourished. No distress.  HENT:  Head: Normocephalic and atraumatic.  Mouth/Throat: Oropharynx is clear and moist. No oropharyngeal exudate.  Eyes: Conjunctivae and EOM are normal. No scleral icterus.  Neck: Normal range of motion. Neck supple.  Cardiovascular: Normal rate, regular rhythm and normal heart sounds.   Pulmonary/Chest: Effort normal and breath sounds normal. No respiratory distress. He has no wheezes. He has no rales. He exhibits no tenderness.  Abdominal: He exhibits no  distension. There is no rebound and no guarding.  Musculoskeletal: He exhibits no tenderness.  Neurological: He is alert and oriented to person, place, and time. He exhibits normal muscle tone.  Skin: Skin is warm and dry. He is not diaphoretic. No erythema. No pallor.  Psychiatric: His speech is normal and behavior is normal. Judgment and thought content normal. He exhibits a depressed mood.  Nursing note and vitals reviewed.        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 Placement of acetabular polymethylmethacrylate spacer sp  Nearly 6 weeks of IV cefazolin now on protracted keflex but with new diskitis of the T spine sp aspiration which seemed  to be responding to IV ancef and likely due to the original culprit MSSA, He has now finished his 8 weeks of IV ancef and on keflex 1 gram in am and 500 mg in the pm  --recheck inflammatory markers today --continue protracted keflex  Depression with passive SI: spoke to him at length re need for therapy and SSRI. Contracted for safety  I spent greater than 40  minutes with the patient including greater than 50% of time in face to face counsel of the patient regarding his MSSA bacteremia pacemaker infection retroperitoneal abscess and recurrent prosthetic hip infection, thoracic spine, diskiits, severe depression  and in coordination of his care.

## 2015-08-07 ENCOUNTER — Other Ambulatory Visit: Payer: Self-pay | Admitting: Infectious Disease

## 2015-08-07 ENCOUNTER — Telehealth: Payer: Self-pay | Admitting: *Deleted

## 2015-08-07 DIAGNOSIS — T827XXD Infection and inflammatory reaction due to other cardiac and vascular devices, implants and grafts, subsequent encounter: Secondary | ICD-10-CM

## 2015-08-07 NOTE — Telephone Encounter (Signed)
He is to be on INDEFINITE therapy ie years and years

## 2015-08-07 NOTE — Telephone Encounter (Signed)
Request to refill oral Keflex rx, Dr. Daiva EvesVan Dam please advise.  How long do you want the patient to continue oral Keflex?

## 2015-08-09 NOTE — Telephone Encounter (Signed)
Notified patient that refill has been processed.

## 2015-12-18 ENCOUNTER — Ambulatory Visit: Payer: Medicare Other | Admitting: Infectious Disease

## 2015-12-30 ENCOUNTER — Ambulatory Visit (INDEPENDENT_AMBULATORY_CARE_PROVIDER_SITE_OTHER): Payer: Medicare Other | Admitting: Infectious Disease

## 2015-12-30 ENCOUNTER — Encounter: Payer: Self-pay | Admitting: Infectious Disease

## 2015-12-30 VITALS — BP 150/87 | HR 61 | Temp 97.8°F | Ht 71.5 in

## 2015-12-30 DIAGNOSIS — R7881 Bacteremia: Secondary | ICD-10-CM

## 2015-12-30 DIAGNOSIS — T827XXD Infection and inflammatory reaction due to other cardiac and vascular devices, implants and grafts, subsequent encounter: Secondary | ICD-10-CM | POA: Diagnosis not present

## 2015-12-30 DIAGNOSIS — M4624 Osteomyelitis of vertebra, thoracic region: Secondary | ICD-10-CM

## 2015-12-30 DIAGNOSIS — M4645 Discitis, unspecified, thoracolumbar region: Secondary | ICD-10-CM

## 2015-12-30 DIAGNOSIS — R188 Other ascites: Secondary | ICD-10-CM

## 2015-12-30 DIAGNOSIS — T8459XD Infection and inflammatory reaction due to other internal joint prosthesis, subsequent encounter: Secondary | ICD-10-CM

## 2015-12-30 DIAGNOSIS — A4901 Methicillin susceptible Staphylococcus aureus infection, unspecified site: Secondary | ICD-10-CM

## 2015-12-30 DIAGNOSIS — Z96649 Presence of unspecified artificial hip joint: Secondary | ICD-10-CM

## 2015-12-30 LAB — BASIC METABOLIC PANEL WITH GFR
BUN: 19 mg/dL (ref 7–25)
CHLORIDE: 107 mmol/L (ref 98–110)
CO2: 28 mmol/L (ref 20–31)
Calcium: 8.8 mg/dL (ref 8.6–10.3)
Creat: 1.3 mg/dL — ABNORMAL HIGH (ref 0.70–1.11)
GFR, EST NON AFRICAN AMERICAN: 50 mL/min — AB (ref >=60)
GFR, Est African American: 58 mL/min — ABNORMAL LOW (ref >=60)
Glucose, Bld: 78 mg/dL (ref 65–99)
POTASSIUM: 4.4 mmol/L (ref 3.5–5.3)
SODIUM: 141 mmol/L (ref 135–146)

## 2015-12-30 NOTE — Progress Notes (Signed)
Chief complaint: lower back pain and mid back pain spasm  that continue to occur  Subjective:    Patient ID: [name], male    DOB: 1930/02/19, 82 y.o.   MRN: [id number]  HPI  82 year old male with Pacemaker, Right prosthetic hip, Laminectomy in January of 2014 admitted in August 2014 with right leg pain weakness fevers. He has been found to have MSSA bacteremia and a large retroperitoneal abscess that is adjacent to his prosthetic hip.  Orthopedics performed resection of Radical irrigation and debridement right total hip with removal of all synovium, revision of femoral head, revision of polyethylene liner placement On 09/02/12  TEE failed to show vegetations on valves or overtly on PM. PM is out. He returned to the hospital with dislocation of his hip. This was able to be reduced without surgical intervention. My partner Dr. Johnnye Sima saw the patient and noted his sedimentation rate and C-reactive protein is still be quite high. Patient completed a course of  8 weeks of cefazolin 2 g IV every 8 hours at the skilled nursing facility along with  oral rifampin twice daily.   I changed him to oral Keflex when his IV antibiotics were stopped and he continued on high-dose Keflex with rifampin since then.   He had recurrent hip dislocations since then.  He was readmitted in late October 2014 and underwent  Irrigation and debridement of infected right total hip with removal of all synovium, revision of femoral head to a +5 40 mm metal head, revision of polyethylene liner to a constrained liner  He had remained on keflex 528m po tid in the interim but stopped the rifampin.  His hip pain  dissapeared but he then developed pain in the right leg itself and difficulty raising leg vs gravity though he can bear weight on it in fall of 2015.  Since then he has experienced recurrence of MSSA infeciton of his hip requiring  Radical irrigation and debridement of infected hybrid right total hip with removal  of all synovium, removal of all components including cement going down the femur and the distal cement restrictor Hemovac drains. Placement of acetabular polymethylmethacrylate spacer   By Dr. RMayer Camelon July 16th, 2016.  intraop cultures yielded no organism but he was on keflex (and rifampin prior to surgery). He had  been placed back on Ancef IV and then transitioned back to oral keflex currently at 1 g po bid.  This November we found that he had  recurrence of pain in his thoracic region including his ribs. He underwent a CTA that did not show a PE on 11/26/14. He was not having fevers but his pain in his ribs and lower back is much worse.  We obtained MRI with sedation which showed:  Acute discitis osteomyelitis at T9-T10, T12-L1. Early discitis at T8-T9 and L4-L5, but no epidural or paraspinal abscess.  We had him urgently admitted to the hospital and had the antibiotics held. IR aspirate from the disk yielded no organisms and he was restarted on IV ancef. His back pain had intiially  improved substantially since then typically worse when getting up from supine position..Marland Kitchen He then had worsening back pain..  It has been specially problematic in the am when he attempts to get up from supine position and he has "Tsunami of pain."  His ESR was completely normal last week  last checked and CRP was normalizing.  I offered repeat MRI now but he would prefered to wait esp since  he requires conscious sedation due to the pain.  Since I last saw him he has continued to suffer form "Tsunami" of pain when changing position. He tells me that this was less of an issue when he had the PICC line but that handling the PICC line forced him to move more carefully.  He is without fevers, nausea or malaise.   He continues to suffer from significant pain as he did when I saw him at the last visit. At that time he had more significant depressive symptoms that seem to improved.   Past Medical History:   Diagnosis Date  . Anxiety   . Arthritis   . Atrial fibrillation ([place])   . Bacteremia   . Chronic right shoulder pain 06/17/2015  . CKD (chronic kidney disease)   . Complication of anesthesia    " Terrible Nightmares"  . Constipation   . Depression   . Diabetes mellitus without complication ([place])    Type II  . Dysrhythmia    Afib- had pacemaker, had it removed- takes Aspirin 325- daily  . Grief 06/17/2015  . History of blood product transfusion   . Hypertension   . Hypothyroidism   . 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 Major depression 06/17/2015  . MSSA (methicillin susceptible Staphylococcus aureus) infection   . OAB (overactive bladder)   . Pacemaker   . Septic hip ([place])   . Thoracic spine pain 11/28/2014    Past Surgical History:  Procedure Laterality Date  . COLONOSCOPY W/ POLYPECTOMY     small  . EYE SURGERY     bilateral  . HIP CLOSED REDUCTION Right 10/02/2012   Procedure: CLOSED REDUCTION HIP;  Surgeon: Hessie Dibble, MD;  Location: [facility];  Service: Orthopedics;  Laterality: Right;  . ICD LEAD REMOVAL Left 08/31/2012   Procedure: ICD LEAD REMOVAL;  Surgeon: Evans Lance, MD;  Location: [facility];  Service: Cardiovascular;  Laterality: Left;  . INCISION AND DRAINAGE HIP Right 09/02/2012   Procedure: IRRIGATION AND DEBRIDEMENT RIGHT HIP;  Surgeon: Kerin Salen, MD;  Location: [facility];  Service: Orthopedics;  Laterality: Right;  . INCISION AND DRAINAGE HIP Right 08/10/2014   Procedure: IRRIGATION AND DEBRIDEMENT RIGHT HIP SUPERFICIAL VS DEEP;  Surgeon: Frederik Pear, MD;  Location: [facility];  Service: Orthopedics;  Laterality: Right;  . JOINT REPLACEMENT    . LUMBAR LAMINECTOMY/DECOMPRESSION MICRODISCECTOMY  02/14/2012   Procedure: LUMBAR LAMINECTOMY/DECOMPRESSION MICRODISCECTOMY 1 LEVEL;  Surgeon: Eustace Moore, MD;  Location: [facility];  Service: Neurosurgery;  Laterality: N/A;  Thoracic twelve - Lumbar one decompressive laminectomy.  Marland Kitchen PACEMAKER INSERTION    . RADIOLOGY WITH  ANESTHESIA N/A 01/10/2015   Procedure: MRI LUMBAR SPINE WITH/WITHOUT   (RADIOLOGY WITH ANESTHESIA);  Surgeon: Medication Radiologist, MD;  Location: [facility];  Service: Radiology;  Laterality: N/A;  . TEE WITHOUT CARDIOVERSION N/A 08/30/2012   Procedure: TRANSESOPHAGEAL ECHOCARDIOGRAM (TEE);  Surgeon: Larey Dresser, MD;  Location: [facility];  Service: Cardiovascular;  Laterality: N/A;  . TONSILLECTOMY    . TOTAL HIP REVISION Right 09/02/2012   Procedure: REVISION OF POLYETHYLENE LINER AND FEMORAL HEAD ;  Surgeon: Kerin Salen, MD;  Location: [facility];  Service: Orthopedics;  Laterality: Right;  . TOTAL HIP REVISION Right 11/28/2012   Procedure: TOTAL HIP REVISION With Placement of Contrain Liner;  Surgeon: Kerin Salen, MD;  Location: [facility];  Service: Orthopedics;  Laterality: Right;    No family history on file.    Social History  Social History  . Marital status: Married    Spouse name: N/A  . Number of children: N/A  . Years of education: N/A   Social History Main Topics  . Smoking status: Never Smoker  . Smokeless tobacco: Never Used  . Alcohol use No  . Drug use: No  . Sexual activity: Not Asked   Other Topics Concern  . None   Social History Narrative  . None    Allergies  Allergen Reactions  . Ambien [Zolpidem] Other (See Comments)    hallucinations     Current Outpatient Prescriptions:  .  acetaminophen (TYLENOL) 500 MG tablet, Take 750 mg by mouth daily as needed (pain)., Disp: , Rfl:  .  ALPRAZolam (XANAX) 0.5 MG tablet, Take 0.5 mg by mouth daily as needed for anxiety. , Disp: , Rfl:  .  aspirin EC 325 MG tablet, Take 1 tablet (325 mg total) by mouth 2 (two) times daily. (Patient taking differently: Take 325 mg by mouth daily. ), Disp: 30 tablet, Rfl: 0 .  cephALEXin (KEFLEX) 500 MG capsule, Take 2 capsules by mouth  two times daily, Disp: 360 capsule, Rfl: 11 .  diltiazem (CARDIZEM CD) 120 MG 24 hr capsule, Take 120 mg by mouth daily., Disp: , Rfl:  .   docusate sodium 100 MG CAPS, Take 100 mg by mouth 2 (two) times daily., Disp: 10 capsule, Rfl: 0 .  ferrous sulfate 325 (65 FE) MG tablet, Take 325 mg by mouth daily with breakfast., Disp: , Rfl:  .  finasteride (PROSCAR) 5 MG tablet, Take 5 mg by mouth at bedtime. , Disp: , Rfl:  .  HYDROcodone-acetaminophen (NORCO/VICODIN) 5-325 MG tablet, Take 1 tablet by mouth 2 (two) times daily as needed (pain)., Disp: , Rfl:  .  lactulose (CHRONULAC) 10 GM/15ML solution, Take 20 g by mouth daily., Disp: , Rfl:  .  levothyroxine (SYNTHROID, LEVOTHROID) 88 MCG tablet, Take 88 mcg by mouth daily before breakfast., Disp: , Rfl:  .  lovastatin (MEVACOR) 40 MG tablet, Take 40 mg by mouth at bedtime. Reported on 04/16/2015, Disp: , Rfl:  .  metolazone (ZAROXOLYN) 5 MG tablet, Take 2.5 mg by mouth every Monday, Wednesday, and Friday. Reported on 04/16/2015, Disp: , Rfl:  .  metoprolol succinate (TOPROL-XL) 25 MG 24 hr tablet, Take 12.5 mg by mouth daily., Disp: , Rfl:  .  ONE TOUCH ULTRA TEST test strip, , Disp: , Rfl:  .  oxyCODONE (OXY IR/ROXICODONE) 5 MG immediate release tablet, Take 5 mg by mouth daily as needed for severe pain (pain). Reported on 06/17/2015, Disp: , Rfl:  .  pioglitazone (ACTOS) 15 MG tablet, Take 15 mg by mouth daily. , Disp: , Rfl:  .  potassium chloride SA (K-DUR,KLOR-CON) 20 MEQ tablet, Take 1 tablet (20 mEq total) by mouth daily. (Patient taking differently: Take 20 mEq by mouth 2 (two) times daily. ), Disp: 3 tablet, Rfl: 0 .  Probiotic Product (PROBIOTIC DAILY PO), Take 1 capsule by mouth daily. , Disp: , Rfl:  .  terazosin (HYTRIN) 5 MG capsule, Take 5 mg by mouth at bedtime. , Disp: , Rfl:  .  tiZANidine (ZANAFLEX) 2 MG tablet, Take 1 tablet (2 mg total) by mouth every 6 (six) hours as needed for muscle spasms., Disp: 60 tablet, Rfl: 0 .  torsemide (DEMADEX) 20 MG tablet, Take 20 mg by mouth daily. , Disp: , Rfl:    Review of Systems  Constitutional: Negative for activity change,  appetite change, chills,  diaphoresis, fatigue and unexpected weight change.  HENT: Negative for sore throat.   Eyes: Negative for visual disturbance.  Respiratory: Negative for cough and chest tightness.   Gastrointestinal: Negative for abdominal distention, abdominal pain, anal bleeding, blood in stool, constipation, diarrhea, nausea and vomiting.  Genitourinary: Negative for difficulty urinating, dysuria, flank pain and hematuria.  Musculoskeletal: Positive for arthralgias, back pain, gait problem and myalgias. Negative for joint swelling.  Skin: Negative for color change, pallor, rash and wound.  Neurological: Positive for weakness. Negative for dizziness, tremors and light-headedness.  Hematological: Negative for adenopathy. Does not bruise/bleed easily.  Psychiatric/Behavioral: Negative for agitation, behavioral problems, confusion, decreased concentration, dysphoric mood, self-injury, sleep disturbance and suicidal ideas.       Objective:   Physical Exam  Constitutional: He is oriented to person, place, and time. He appears well-developed and well-nourished. No distress.  HENT:  Head: Normocephalic and atraumatic.  Mouth/Throat: Oropharynx is clear and moist. No oropharyngeal exudate.  Eyes: Conjunctivae and EOM are normal. No scleral icterus.  Neck: Normal range of motion. Neck supple.  Cardiovascular: Normal rate, regular rhythm and normal heart sounds.   Pulmonary/Chest: Effort normal and breath sounds normal. No respiratory distress. He has no wheezes. He has no rales. He exhibits no tenderness.  Abdominal: He exhibits no distension. There is no rebound and no guarding.  Musculoskeletal: He exhibits no tenderness.  Neurological: He is alert and oriented to person, place, and time. He exhibits normal muscle tone.  Skin: Skin is warm and dry. He is not diaphoretic. No erythema. No pallor.  Psychiatric: His speech is normal and behavior is normal. Judgment and thought content  normal. He exhibits a depressed mood.  Nursing note and vitals reviewed.        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 Placement of acetabular polymethylmethacrylate spacer sp  Nearly 6 weeks of IV cefazolin now on protracted keflex but with new diskitis of the T spine sp aspiration which seemed  to be responding to IV ancef and likely due to the original culprit MSSA, He has now finished his 8 weeks of IV ancef and on keflex 1 gram in am and 500 mg in the pm  --recheck inflammatory markers today --continue protracted keflex  Depression:Seems to have become better controlled  I spent greater than 25  minutes with the patient including greater than 50% of time in face to face counsel of the patient regarding his MSSA bacteremia pacemaker infection retroperitoneal abscess and recurrent prosthetic hip infection, thoracic spine, diskiits,  depression and in coordination of his care.

## 2015-12-31 LAB — C-REACTIVE PROTEIN: CRP: 3.2 mg/L (ref <8.0)

## 2015-12-31 LAB — SEDIMENTATION RATE: Sed Rate: 10 mm/hr (ref 0–20)

## 2016-01-11 DIAGNOSIS — F419 Anxiety disorder, unspecified: Secondary | ICD-10-CM | POA: Insufficient documentation

## 2016-09-10 ENCOUNTER — Other Ambulatory Visit: Payer: Self-pay | Admitting: Infectious Disease

## 2016-09-10 DIAGNOSIS — T827XXD Infection and inflammatory reaction due to other cardiac and vascular devices, implants and grafts, subsequent encounter: Secondary | ICD-10-CM

## 2016-10-10 ENCOUNTER — Other Ambulatory Visit: Payer: Self-pay | Admitting: Infectious Disease

## 2016-10-10 DIAGNOSIS — T827XXD Infection and inflammatory reaction due to other cardiac and vascular devices, implants and grafts, subsequent encounter: Secondary | ICD-10-CM

## 2016-10-12 ENCOUNTER — Encounter: Payer: Self-pay | Admitting: *Deleted

## 2016-12-07 ENCOUNTER — Ambulatory Visit (INDEPENDENT_AMBULATORY_CARE_PROVIDER_SITE_OTHER): Payer: Medicare Other | Admitting: Infectious Disease

## 2016-12-07 ENCOUNTER — Encounter: Payer: Self-pay | Admitting: Infectious Disease

## 2016-12-07 VITALS — BP 121/69 | HR 82 | Temp 97.7°F

## 2016-12-07 DIAGNOSIS — G309 Alzheimer's disease, unspecified: Secondary | ICD-10-CM

## 2016-12-07 DIAGNOSIS — A4901 Methicillin susceptible Staphylococcus aureus infection, unspecified site: Secondary | ICD-10-CM | POA: Diagnosis not present

## 2016-12-07 DIAGNOSIS — T827XXD Infection and inflammatory reaction due to other cardiac and vascular devices, implants and grafts, subsequent encounter: Secondary | ICD-10-CM | POA: Diagnosis not present

## 2016-12-07 DIAGNOSIS — T8451XD Infection and inflammatory reaction due to internal right hip prosthesis, subsequent encounter: Secondary | ICD-10-CM | POA: Diagnosis not present

## 2016-12-07 DIAGNOSIS — F0280 Dementia in other diseases classified elsewhere without behavioral disturbance: Secondary | ICD-10-CM | POA: Diagnosis not present

## 2016-12-07 DIAGNOSIS — M4624 Osteomyelitis of vertebra, thoracic region: Secondary | ICD-10-CM | POA: Diagnosis not present

## 2016-12-07 DIAGNOSIS — F322 Major depressive disorder, single episode, severe without psychotic features: Secondary | ICD-10-CM | POA: Diagnosis not present

## 2016-12-07 DIAGNOSIS — Z7189 Other specified counseling: Secondary | ICD-10-CM

## 2016-12-07 DIAGNOSIS — F32A Depression, unspecified: Secondary | ICD-10-CM | POA: Insufficient documentation

## 2016-12-07 DIAGNOSIS — G301 Alzheimer's disease with late onset: Secondary | ICD-10-CM

## 2016-12-07 DIAGNOSIS — F329 Major depressive disorder, single episode, unspecified: Secondary | ICD-10-CM | POA: Insufficient documentation

## 2016-12-07 HISTORY — DX: Dementia in other diseases classified elsewhere, unspecified severity, without behavioral disturbance, psychotic disturbance, mood disturbance, and anxiety: F02.80

## 2016-12-07 HISTORY — DX: Major depressive disorder, single episode, severe without psychotic features: F32.2

## 2016-12-07 MED ORDER — CEPHALEXIN 500 MG PO CAPS: ORAL_CAPSULE | ORAL | 11 refills | Status: DC

## 2016-12-07 NOTE — Progress Notes (Signed)
Chief complaint: pain all over but in hands and arms where he was walker as well in both of his hips including the hip where he had a [place]irdl[place] and his native hip  Subjective:    Patient ID: [name], male    DOB: 1930/06/14, 83 y.o.   MRN: [id number]  HPI  83 year old male with Pacemaker, Right prosthetic hip, Laminectomy in January of 2014 admitted in August 2014 with right leg pain weakness fevers. He has been found to have MSSA bacteremia and a large retroperitoneal abscess that is adjacent to his prosthetic hip.  Orthopedics performed resection of Radical irrigation and debridement right total hip with removal of all synovium, revision of femoral head, revision of polyethylene liner placement On 09/02/12  TEE failed to show vegetations on valves or overtly on PM. PM is out. He returned to the hospital with dislocation of his hip. This was able to be reduced without surgical intervention. My partner Dr. Johnnye Sima saw the patient and noted his sedimentation rate and C-reactive protein is still be quite high. Patient completed a course of  8 weeks of cefazolin 2 g IV every 8 hours at the skilled nursing facility along with  oral rifampin twice daily.   I changed him to oral Keflex when his IV antibiotics were stopped and he continued on high-dose Keflex with rifampin since then.   He had recurrent hip dislocations since then.  He was readmitted in late October 2014 and underwent  Irrigation and debridement of infected right total hip with removal of all synovium, revision of femoral head to a +5 40 mm metal head, revision of polyethylene liner to a constrained liner  He had remained on keflex 519m po tid in the interim but stopped the rifampin.  His hip pain  dissapeared but he then developed pain in the right leg itself and difficulty raising leg vs gravity though he can bear weight on it in fall of 2015.  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 Placement of acetabular polymethylmethacrylate spacer   By Dr. RMayer Camelon July 16th, 2016.  intraop cultures yielded no organism but he was on keflex (and rifampin prior to surgery). He had  been placed back on Ancef IV and then transitioned back to oral keflex currently at 1 g po bid.  This November we found that he had  recurrence of pain in his thoracic region including his ribs. He underwent a CTA that did not show a PE on 11/26/14. He was not having fevers but his pain in his ribs and lower back is much worse.  We obtained MRI with sedation which showed:  Acute discitis osteomyelitis at T9-T10, T12-L1. Early discitis at T8-T9 and L4-L5, but no epidural or paraspinal abscess.  We had him urgently admitted to the hospital and had the antibiotics held. IR aspirate from the disk yielded no organisms and he was restarted on IV ancef. His back pain had intiially  improved substantially since then typically worse when getting up from supine position..Marland Kitchen He then had worsening back pain..  It has been specially problematic in the am when he attempts to get up from supine position and he has "Tsunami of pain."  His ESR was completely normal last week  last checked  and CRP was normalizing.  I offered repeat MRI now but he would prefered to wait esp since he requires conscious sedation due to the pain.  At last visithe had continued to suffer form "Tsunami" of pain when changing position. He told  me that this was less of an issue when he had the PICC line but that handling the PICC line forced him to move more carefully.  I last saw him in December 2017.  He tells me in the last 3 months that his pain has worsened.  He is here with his daughter apparently he has been trying to  do more physical therapy with a walker and been developing pain in his hands and arms attempting to use the walker.  He also unfortunately is experiencing stress to the fact that his wife is now become quite sick and is unable to help him with many of his activities of daily living for which she requires assistance.  He is largely spending most of his time in a wheelchair now.  He is frankly overtly depressed and tearful during exam today.  His pain is not well managed presently and he is still trying to get this under better control.  I have concerned that this could be a marker of infection though his pain is largely something that comes on with activities and stressors such as trying to walk with a walker or certain positions.  It is certainly not a constant symptom and he has no other systemic symptoms such as fevers or chills.  He would benefit from more extensive conversations with primary care and potentially referral to palliative care to elucidate goals of care.  I wonder if being in his home is going to be an appropriate place for him in particular if he is going to need to still have aggressive care.  Daughter accompanies him today and states that he has had problems with memory as well.  Patient was quite tearful and depressed and much more so than I have ever seen before.  I did offer to have him seen by counselor but unfortunately her counselor was not here today.  He did contract for safety.  He and the daughter promised that he would be seen by primary care and the other relevant physicians.    Past Medical History:  Diagnosis Date  . Anxiety   . Arthritis   . Atrial fibrillation ([place])   . Bacteremia   . Chronic right shoulder pain 06/17/2015  . CKD (chronic kidney disease)   . Complication of anesthesia    " Terrible Nightmares"  . Constipation   . Depression   . Diabetes mellitus without complication ([place])    Type II  . Dysrhythmia    Afib- had pacemaker, had it removed- takes  Aspirin 325- daily  . Grief 06/17/2015  . History of blood product transfusion   . Hypertension   . Hypothyroidism   . 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 Major depression 06/17/2015  . MSSA (methicillin susceptible Staphylococcus aureus) infection   . OAB (overactive bladder)   . Pacemaker   . 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 Thoracic spine pain 11/28/2014    Past Surgical History:  Procedure Laterality Date  . COLONOSCOPY W/ POLYPECTOMY     small  . EYE SURGERY     bilateral  . JOINT REPLACEMENT    . PACEMAKER INSERTION    . TONSILLECTOMY      No family history on file.    Social  History   Socioeconomic History  . Marital status: Married    Spouse name: Not on file  . Number of children: Not on file  . Years of education: Not on file  . Highest education level: Not on file  Social Needs  . Financial resource strain: Not on file  . Food insecurity - worry: Not on file  . Food insecurity - inability: Not on file  . Transportation needs - medical: Not on file  . Transportation needs - non-medical: Not on file  Occupational History  . Not on file  Tobacco Use  . Smoking status: Never Smoker  . Smokeless tobacco: Never Used  Substance and Sexual Activity  . Alcohol use: No    Alcohol/week: 0.0 oz  . Drug use: No  . Sexual activity: Not on file  Other Topics Concern  . Not on file  Social History Narrative  . Not on file    Allergies  Allergen Reactions  . Ambien [Zolpidem] Other (See Comments)    hallucinations     Current Outpatient Medications:  .  acetaminophen (TYLENOL) 500 MG tablet, Take 750 mg by mouth daily as needed (pain)., Disp: , Rfl:  .  ALPRAZolam (XANAX) 0.5 MG tablet, Take 0.5 mg by mouth daily as needed for anxiety. , Disp: , Rfl:  .  aspirin EC 325 MG tablet, Take 1 tablet (325 mg total) by mouth 2 (two) times daily. (Patient taking differently: Take 325 mg by mouth daily. ), Disp: 30 tablet, Rfl: 0 .  cephALEXin (KEFLEX) 500 MG capsule, TAKE 2 CAPSULES  BY MOUTH  TWO TIMES DAILY, Disp: 240 capsule, Rfl: 1 .  diltiazem (CARDIZEM CD) 120 MG 24 hr capsule, Take 120 mg by mouth daily., Disp: , Rfl:  .  docusate sodium 100 MG CAPS, Take 100 mg by mouth 2 (two) times daily., Disp: 10 capsule, Rfl: 0 .  ferrous sulfate 325 (65 FE) MG tablet, Take 325 mg by mouth daily with breakfast., Disp: , Rfl:  .  finasteride (PROSCAR) 5 MG tablet, Take 5 mg by mouth at bedtime. , Disp: , Rfl:  .  HYDROcodone-acetaminophen (NORCO/VICODIN) 5-325 MG tablet, Take 1 tablet by mouth 2 (two) times daily as needed (pain)., Disp: , Rfl:  .  lactulose (CHRONULAC) 10 GM/15ML solution, Take 20 g by mouth daily., Disp: , Rfl:  .  levothyroxine (SYNTHROID, LEVOTHROID) 88 MCG tablet, Take 88 mcg by mouth daily before breakfast., Disp: , Rfl:  .  lovastatin (MEVACOR) 40 MG tablet, Take 40 mg by mouth at bedtime. Reported on 04/16/2015, Disp: , Rfl:  .  metolazone (ZAROXOLYN) 5 MG tablet, Take 2.5 mg by mouth every Monday, Wednesday, and Friday. Reported on 04/16/2015, Disp: , Rfl:  .  metoprolol succinate (TOPROL-XL) 25 MG 24 hr tablet, Take 12.5 mg by mouth daily., Disp: , Rfl:  .  ONE TOUCH ULTRA TEST test strip, , Disp: , Rfl:  .  oxyCODONE (OXY IR/ROXICODONE) 5 MG immediate release tablet, Take 5 mg by mouth daily as needed for severe pain (pain). Reported on 06/17/2015, Disp: , Rfl:  .  pioglitazone (ACTOS) 15 MG tablet, Take 15 mg by mouth daily. , Disp: , Rfl:  .  potassium chloride SA (K-DUR,KLOR-CON) 20 MEQ tablet, Take 1 tablet (20 mEq total) by mouth daily. (Patient taking differently: Take 20 mEq by mouth 2 (two) times daily. ), Disp: 3 tablet, Rfl: 0 .  Probiotic Product (PROBIOTIC DAILY PO), Take 1 capsule by mouth daily. , Disp: ,  Rfl:  .  terazosin (HYTRIN) 5 MG capsule, Take 5 mg by mouth at bedtime. , Disp: , Rfl:  .  tiZANidine (ZANAFLEX) 2 MG tablet, Take 1 tablet (2 mg total) by mouth every 6 (six) hours as needed for muscle spasms., Disp: 60 tablet, Rfl: 0 .   torsemide (DEMADEX) 20 MG tablet, Take 20 mg by mouth daily. , Disp: , Rfl:    Review of Systems  Constitutional: Negative for activity change, appetite change, chills, diaphoresis, fatigue and unexpected weight change.  HENT: Negative for sore throat.   Eyes: Negative for visual disturbance.  Respiratory: Negative for cough and chest tightness.   Gastrointestinal: Negative for abdominal distention, abdominal pain, anal bleeding, blood in stool, constipation, diarrhea, nausea and vomiting.  Genitourinary: Negative for difficulty urinating, dysuria, flank pain and hematuria.  Musculoskeletal: Positive for arthralgias, back pain, gait problem and myalgias. Negative for joint swelling.  Skin: Negative for color change, pallor, rash and wound.  Neurological: Positive for weakness. Negative for dizziness, tremors and light-headedness.  Hematological: Negative for adenopathy. Does not bruise/bleed easily.  Psychiatric/Behavioral: Positive for confusion and dysphoric mood. Negative for agitation, behavioral problems, decreased concentration, self-injury, sleep disturbance and suicidal ideas. The patient is nervous/anxious.        Objective:   Physical Exam  Constitutional: He is oriented to person, place, and time. He appears well-developed and well-nourished. No distress.  HENT:  Head: Normocephalic and atraumatic.  Mouth/Throat: Oropharynx is clear and moist. No oropharyngeal exudate.  Eyes: Conjunctivae and EOM are normal. No scleral icterus.  Neck: Normal range of motion. Neck supple.  Cardiovascular: Normal rate, regular rhythm and normal heart sounds.  Pulmonary/Chest: Effort normal and breath sounds normal. No respiratory distress. He has no wheezes. He has no rales. He exhibits no tenderness.  Abdominal: He exhibits no distension. There is no rebound and no guarding.  Musculoskeletal: He exhibits no tenderness.       Left hip: He exhibits normal range of motion.        Legs: Neurological: He is alert and oriented to person, place, and time. He exhibits normal muscle tone.  Skin: Skin is warm and dry. He is not diaphoretic. No erythema. No pallor.  Psychiatric: His behavior is normal. Judgment and thought content normal. His mood appears anxious. His speech is delayed. He exhibits a depressed mood.  tearful  Nursing note and vitals reviewed.        Assessment & Plan:   Severe depression: The factorial in nature with his declining health severe pain that is not well controlled recent illness of his wife and fact that seems that he and wife and family are overwhelmed by care needs that he has.  I recommended he be seen more quickly by primary care and consideration to outpatient palliative care consultation.     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 Placement of acetabular polymethylmethacrylate spacer sp  Nearly 6 weeks of IV cefazolin now on protracted keflex but with new diskitis of the T spine sp aspiration which seemed  to be responding to IV ancef  and likely due to the original culprit MSSA, He has now finished his 8 weeks of IV ancef and on keflex 1 gram in am and 500 mg in the pm  Fact that he continues to have pain that seems worse every time I see him though some of this sounds mechanical in nature is concerning I will recheck his inflammatory markers today and continue his Keflex indefinitely.  Imaging is problematic due to the need for nearly anesthesia grade sedation  to obtain imaging.  I also suggested follow-up with Dr. Mayer Camel  I spent greater than 40 minutes with the patient including greater than 50% of time in face to face counsel of the patient and his daughter re his depressive symptoms, his pain, his anxiety, his guilt about being a burden. and in coordination of his  Care. I strongly recommended further "big picture discussions with PCP and if possible Palliative Care.

## 2016-12-08 LAB — BASIC METABOLIC PANEL WITH GFR
BUN/Creatinine Ratio: 12 (calc) (ref 6–22)
BUN: 19 mg/dL (ref 7–25)
CALCIUM: 9.1 mg/dL (ref 8.6–10.3)
CO2: 26 mmol/L (ref 20–32)
Chloride: 111 mmol/L — ABNORMAL HIGH (ref 98–110)
Creat: 1.56 mg/dL — ABNORMAL HIGH (ref 0.70–1.11)
GFR, EST AFRICAN AMERICAN: 47 mL/min/{1.73_m2} — AB (ref >=60)
GFR, EST NON AFRICAN AMERICAN: 40 mL/min/{1.73_m2} — AB (ref >=60)
Glucose, Bld: 110 mg/dL — ABNORMAL HIGH (ref 65–99)
POTASSIUM: 5 mmol/L (ref 3.5–5.3)
Sodium: 143 mmol/L (ref 135–146)

## 2016-12-08 LAB — CBC WITH DIFFERENTIAL/PLATELET
BASOS PCT: 0.5 %
Basophils Absolute: 33 cells/uL (ref 0–200)
EOS ABS: 124 {cells}/uL (ref 15–500)
EOS PCT: 1.9 %
HEMATOCRIT: 33.7 % — AB (ref 38.5–50.0)
HEMOGLOBIN: 11.2 g/dL — AB (ref 13.2–17.1)
LYMPHS ABS: 1261 {cells}/uL (ref 850–3900)
MCH: 32 pg (ref 27.0–33.0)
MCHC: 33.2 g/dL (ref 32.0–36.0)
MCV: 96.3 fL (ref 80.0–100.0)
MONOS PCT: 9.8 %
MPV: 11.5 fL (ref 7.5–12.5)
NEUTROS ABS: 4446 {cells}/uL (ref 1500–7800)
Neutrophils Relative %: 68.4 %
Platelets: 126 10*3/uL — ABNORMAL LOW (ref 140–400)
RBC: 3.5 10*6/uL — ABNORMAL LOW (ref 4.20–5.80)
RDW: 12.7 % (ref 11.0–15.0)
Total Lymphocyte: 19.4 %
WBC mixed population: 637 cells/uL (ref 200–950)
WBC: 6.5 10*3/uL (ref 3.8–10.8)

## 2016-12-08 LAB — SEDIMENTATION RATE: Sed Rate: 22 mm/h — ABNORMAL HIGH (ref 0–20)

## 2016-12-08 LAB — C-REACTIVE PROTEIN: CRP: 2.9 mg/L (ref <8.0)

## 2017-02-09 ENCOUNTER — Ambulatory Visit: Payer: Medicare Other | Admitting: Infectious Disease

## 2017-02-24 ENCOUNTER — Other Ambulatory Visit: Payer: Self-pay

## 2017-02-24 ENCOUNTER — Encounter: Payer: Self-pay | Admitting: Infectious Disease

## 2017-02-24 ENCOUNTER — Ambulatory Visit (INDEPENDENT_AMBULATORY_CARE_PROVIDER_SITE_OTHER): Payer: Medicare Other | Admitting: Infectious Disease

## 2017-02-24 VITALS — BP 123/67 | HR 74 | Temp 98.2°F | Ht 71.5 in | Wt 243.0 lb

## 2017-02-24 DIAGNOSIS — M25511 Pain in right shoulder: Secondary | ICD-10-CM

## 2017-02-24 DIAGNOSIS — A4901 Methicillin susceptible Staphylococcus aureus infection, unspecified site: Secondary | ICD-10-CM | POA: Diagnosis not present

## 2017-02-24 DIAGNOSIS — M4624 Osteomyelitis of vertebra, thoracic region: Secondary | ICD-10-CM | POA: Diagnosis not present

## 2017-02-24 DIAGNOSIS — N183 Chronic kidney disease, stage 3 unspecified: Secondary | ICD-10-CM

## 2017-02-24 DIAGNOSIS — T8451XD Infection and inflammatory reaction due to internal right hip prosthesis, subsequent encounter: Secondary | ICD-10-CM | POA: Diagnosis not present

## 2017-02-24 DIAGNOSIS — T827XXD Infection and inflammatory reaction due to other cardiac and vascular devices, implants and grafts, subsequent encounter: Secondary | ICD-10-CM | POA: Diagnosis not present

## 2017-02-24 DIAGNOSIS — G8929 Other chronic pain: Secondary | ICD-10-CM | POA: Diagnosis not present

## 2017-02-24 DIAGNOSIS — M25512 Pain in left shoulder: Secondary | ICD-10-CM

## 2017-02-24 NOTE — Progress Notes (Signed)
Chief complaint:   Subjective:    Patient ID: [name], male    DOB: [birth year], 84 y.o.   MRN: [id number]  HPI  84 year old male with Pacemaker, Right prosthetic hip, Laminectomy in January of 2014 admitted in August 2014 with right leg pain weakness fevers. He has been found to have MSSA bacteremia and a large retroperitoneal abscess that is adjacent to his prosthetic hip.  Orthopedics performed resection of Radical irrigation and debridement right total hip with removal of all synovium, revision of femoral head, revision of polyethylene liner placement On 09/02/12  TEE failed to show vegetations on valves or overtly on PM. PM is out. He returned to the hospital with dislocation of his hip. This was able to be reduced without surgical intervention. My partner Dr. Johnnye Sima saw the patient and noted his sedimentation rate and C-reactive protein is still be quite high. Patient completed a course of  8 weeks of cefazolin 2 g IV every 8 hours at the skilled nursing facility along with  oral rifampin twice daily.   I changed him to oral Keflex when his IV antibiotics were stopped and he continued on high-dose Keflex with rifampin since then.   He had recurrent hip dislocations since then.  He was readmitted in late October 2014 and underwent  Irrigation and debridement of infected right total hip with removal of all synovium, revision of femoral head to a +5 40 mm metal head, revision of polyethylene liner to a constrained liner  He had remained on keflex 530m po tid in the interim but stopped the rifampin.  His hip pain  dissapeared but he then developed pain in the right leg itself and difficulty raising leg vs gravity though he can bear weight on it in fall of 2015.  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 Placement of acetabular polymethylmethacrylate spacer   By Dr. RMayer Camelon July 16th, 2016.  intraop cultures yielded no organism but he was on keflex (and rifampin prior to surgery). He had  been placed back on Ancef IV and then transitioned back to oral keflex currently at 1 g po bid.  In  November we found that he had  recurrence of pain in his thoracic region including his ribs. He underwent a CTA that did not show a PE on 11/26/14. He was not having fevers but his pain in his ribs and lower back is much worse.  We obtained MRI with sedation which showed:  Acute discitis osteomyelitis at T9-T10, T12-L1. Early discitis at T8-T9 and L4-L5, but no epidural or paraspinal abscess.  We had him urgently admitted to the hospital and had the antibiotics held. IR aspirate from the disk yielded no organisms and he was restarted on IV ancef. His back pain had intiially  improved substantially since then typically worse when getting up from supine position..Marland Kitchen He then had worsening back pain..  It has been specially problematic in the am when he attempts to get up from supine position and he has "Tsunami of pain."  His ESR was completely normal and CRP was normalizing.  I had offered repeat MRI now but he would prefered to wait esp since he requires conscious sedation due to the pain.  At last visithe had continued to  suffer form "Tsunami" of pain when changing position. He told  me that this was less of an issue when he had the PICC line but that handling the PICC line forced him to move more carefully.  I saw him in December 2017 then in  November of 2018 and he told me that over 3 months prior he had worsening of pain.   He was  here with his daughter apparently he has been trying to do more physical therapy with a walker and been developing pain in his hands and arms attempting to use the walker.  He as also  unfortunately is  experiencing stress to the fact that his wife had become quite sick and is unable to help him with many of his activities of daily living for which she requires assistance.    He was  largely spending most of his time in a wheelchair.  He was at that time frankly overtly depressed and tearful during exam.  His pain was not well managed presently and he is still trying to get this under better control.  He returns to clinic for follow-up today.  Labs drawn during that visit showed his sed rate was at 20 and CRP was 2.9.  He returns to clinic with his wife today.  His wife has recovered from her hospitalization and rehab.  They are both living at home.  He has been seeing physicians in the pain clinic and they have helped tremendously with his pain management.  His only complaint is that he feels like he is not thinking is clearly which he attributes to the pain medicines.  He does still have pain in both hips including now the right hip where he will also have spasms of pain similar to those he described before.  He also has increased had increased pain in bilateral shoulders over the last several months.  He claims that corticosteroid injections have helped these although I have a little bit of anxiety that he could have infection at these sites as well.  Pain in the shoulders at least is not there all the time but more when he puts stress on them such as trying to use his walker.      Past Medical History:  Diagnosis Date  . Alzheimer's type dementia 12/07/2016  . Anxiety   . Arthritis   . Atrial fibrillation ([place])   . Bacteremia   . Chronic right shoulder pain 06/17/2015  . CKD (chronic kidney disease)   . Complication of anesthesia    " Terrible Nightmares"  . Constipation   . Depression   . Diabetes mellitus without complication ([place])    Type II  . Dysrhythmia    Afib- had pacemaker, had it removed- takes Aspirin 325- daily  . Grief 06/17/2015  . History of blood product  transfusion   . Hypertension   . Hypothyroidism   . 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 Major depression 06/17/2015  . MSSA (methicillin susceptible Staphylococcus aureus) infection   . OAB (overactive bladder)   . Pacemaker   . Septic hip ([place])   . Severe depression ([place]) 12/07/2016  . Thoracic spine pain 11/28/2014    Past Surgical History:  Procedure Laterality Date  . COLONOSCOPY W/ POLYPECTOMY     small  . EYE SURGERY     bilateral  . HIP CLOSED REDUCTION Right 10/02/2012   Procedure: CLOSED REDUCTION HIP;  Surgeon: Hessie Dibble, MD;  Location: [facility];  Service: Orthopedics;  Laterality: Right;  .  ICD LEAD REMOVAL Left 08/31/2012   Procedure: ICD LEAD REMOVAL;  Surgeon: Evans Lance, MD;  Location: [facility];  Service: Cardiovascular;  Laterality: Left;  . INCISION AND DRAINAGE HIP Right 09/02/2012   Procedure: IRRIGATION AND DEBRIDEMENT RIGHT HIP;  Surgeon: Kerin Salen, MD;  Location: [facility];  Service: Orthopedics;  Laterality: Right;  . INCISION AND DRAINAGE HIP Right 08/10/2014   Procedure: IRRIGATION AND DEBRIDEMENT RIGHT HIP SUPERFICIAL VS DEEP;  Surgeon: Frederik Pear, MD;  Location: [facility];  Service: Orthopedics;  Laterality: Right;  . JOINT REPLACEMENT    . LUMBAR LAMINECTOMY/DECOMPRESSION MICRODISCECTOMY  02/14/2012   Procedure: LUMBAR LAMINECTOMY/DECOMPRESSION MICRODISCECTOMY 1 LEVEL;  Surgeon: Eustace Moore, MD;  Location: [facility];  Service: Neurosurgery;  Laterality: N/A;  Thoracic twelve - Lumbar one decompressive laminectomy.  Marland Kitchen PACEMAKER INSERTION    . RADIOLOGY WITH ANESTHESIA N/A 01/10/2015   Procedure: MRI LUMBAR SPINE WITH/WITHOUT   (RADIOLOGY WITH ANESTHESIA);  Surgeon: Medication Radiologist, MD;  Location: [facility];  Service: Radiology;  Laterality: N/A;  . TEE WITHOUT CARDIOVERSION N/A 08/30/2012   Procedure: TRANSESOPHAGEAL ECHOCARDIOGRAM (TEE);  Surgeon: Larey Dresser, MD;  Location: [facility];  Service: Cardiovascular;  Laterality: N/A;  . TONSILLECTOMY    .  TOTAL HIP REVISION Right 09/02/2012   Procedure: REVISION OF POLYETHYLENE LINER AND FEMORAL HEAD ;  Surgeon: Kerin Salen, MD;  Location: [facility];  Service: Orthopedics;  Laterality: Right;  . TOTAL HIP REVISION Right 11/28/2012   Procedure: TOTAL HIP REVISION With Placement of Contrain Liner;  Surgeon: Kerin Salen, MD;  Location: [facility];  Service: Orthopedics;  Laterality: Right;    No family history on file.    Social History   Socioeconomic History  . Marital status: Married    Spouse name: None  . Number of children: None  . Years of education: None  . Highest education level: None  Social Needs  . Financial resource strain: None  . Food insecurity - worry: None  . Food insecurity - inability: None  . Transportation needs - medical: None  . Transportation needs - non-medical: None  Occupational History  . None  Tobacco Use  . Smoking status: Never Smoker  . Smokeless tobacco: Never Used  Substance and Sexual Activity  . Alcohol use: No    Alcohol/week: 0.0 oz  . Drug use: No  . Sexual activity: None  Other Topics Concern  . None  Social History Narrative  . None    Allergies  Allergen Reactions  . Ace Inhibitors Other (See Comments)    Cough Cough   . Nsaids Other (See Comments)    Renal insuff  . Zolpidem Other (See Comments)    hallucinations Nightmares, irritable, nervouse     Current Outpatient Medications:  .  acetaminophen (TYLENOL) 500 MG tablet, Take 750 mg by mouth daily as needed (pain)., Disp: , Rfl:  .  ALPRAZolam (XANAX) 0.5 MG tablet, Take 0.5 mg by mouth daily as needed for anxiety. , Disp: , Rfl:  .  aspirin EC 325 MG tablet, Take 1 tablet (325 mg total) by mouth 2 (two) times daily. (Patient taking differently: Take 325 mg by mouth daily. ), Disp: 30 tablet, Rfl: 0 .  cephALEXin (KEFLEX) 500 MG capsule, TAKE 2 CAPSULES BY MOUTH  TWO TIMES DAILY, Disp: 120 capsule, Rfl: 11 .  diltiazem (CARDIZEM CD) 120 MG 24 hr capsule, Take 120 mg by mouth  daily., Disp: , Rfl:  .  docusate sodium  100 MG CAPS, Take 100 mg by mouth 2 (two) times daily., Disp: 10 capsule, Rfl: 0 .  ferrous sulfate 325 (65 FE) MG tablet, Take 325 mg by mouth daily with breakfast., Disp: , Rfl:  .  finasteride (PROSCAR) 5 MG tablet, Take 5 mg by mouth at bedtime. , Disp: , Rfl:  .  HYDROcodone-acetaminophen (NORCO/VICODIN) 5-325 MG tablet, Take 1 tablet by mouth 2 (two) times daily as needed (pain)., Disp: , Rfl:  .  lactulose (CHRONULAC) 10 GM/15ML solution, Take 20 g by mouth daily., Disp: , Rfl:  .  levothyroxine (SYNTHROID, LEVOTHROID) 88 MCG tablet, Take 88 mcg by mouth daily before breakfast., Disp: , Rfl:  .  lovastatin (MEVACOR) 40 MG tablet, Take 40 mg by mouth at bedtime. Reported on 04/16/2015, Disp: , Rfl:  .  metolazone (ZAROXOLYN) 5 MG tablet, Take 2.5 mg by mouth every Monday, Wednesday, and Friday. Reported on 04/16/2015, Disp: , Rfl:  .  metoprolol succinate (TOPROL-XL) 25 MG 24 hr tablet, Take 12.5 mg by mouth daily., Disp: , Rfl:  .  ONE TOUCH ULTRA TEST test strip, , Disp: , Rfl:  .  oxyCODONE (OXY IR/ROXICODONE) 5 MG immediate release tablet, Take 5 mg by mouth daily as needed for severe pain (pain). Reported on 06/17/2015, Disp: , Rfl:  .  pioglitazone (ACTOS) 15 MG tablet, Take 15 mg by mouth daily. , Disp: , Rfl:  .  potassium chloride SA (K-DUR,KLOR-CON) 20 MEQ tablet, Take 1 tablet (20 mEq total) by mouth daily. (Patient taking differently: Take 20 mEq by mouth 2 (two) times daily. ), Disp: 3 tablet, Rfl: 0 .  Probiotic Product (PROBIOTIC DAILY PO), Take 1 capsule by mouth daily. , Disp: , Rfl:  .  terazosin (HYTRIN) 5 MG capsule, Take 5 mg by mouth at bedtime. , Disp: , Rfl:  .  tiZANidine (ZANAFLEX) 2 MG tablet, Take 1 tablet (2 mg total) by mouth every 6 (six) hours as needed for muscle spasms., Disp: 60 tablet, Rfl: 0 .  torsemide (DEMADEX) 20 MG tablet, Take 20 mg by mouth daily. , Disp: , Rfl:    Review of Systems  Constitutional: Negative  for activity change, appetite change, chills, diaphoresis, fatigue and unexpected weight change.  HENT: Negative for sore throat.   Eyes: Negative for visual disturbance.  Respiratory: Negative for cough and chest tightness.   Gastrointestinal: Negative for abdominal distention, abdominal pain, anal bleeding, blood in stool, constipation, diarrhea, nausea and vomiting.  Genitourinary: Negative for difficulty urinating, dysuria, flank pain and hematuria.  Musculoskeletal: Positive for arthralgias, back pain, gait problem and myalgias. Negative for joint swelling.  Skin: Negative for color change, pallor, rash and wound.  Neurological: Positive for weakness. Negative for dizziness, tremors and light-headedness.  Hematological: Negative for adenopathy. Does not bruise/bleed easily.  Psychiatric/Behavioral: Positive for confusion. Negative for agitation, behavioral problems, decreased concentration, dysphoric mood, self-injury, sleep disturbance and suicidal ideas. The patient is nervous/anxious.        Objective:   Physical Exam  Constitutional: He is oriented to person, place, and time. He appears well-developed and well-nourished. No distress.  HENT:  Head: Normocephalic and atraumatic.  Mouth/Throat: Oropharynx is clear and moist. No oropharyngeal exudate.  Eyes: Conjunctivae and EOM are normal. No scleral icterus.  Neck: Normal range of motion. Neck supple.  Cardiovascular: Normal rate, regular rhythm and normal heart sounds.  Pulmonary/Chest: Effort normal and breath sounds normal. No respiratory distress. He has no wheezes. He has no rales. He exhibits no tenderness.  Abdominal: He exhibits no distension. There is no rebound and no guarding.  Musculoskeletal:       Right shoulder: He exhibits tenderness.       Left shoulder: He exhibits tenderness.       Left hip: He exhibits normal range of motion.       Legs: Neurological: He is alert and oriented to person, place, and time. He  exhibits normal muscle tone.  Skin: Skin is warm and dry. He is not diaphoretic. No erythema. No pallor.  Psychiatric: His behavior is normal. Judgment and thought content normal. His mood appears not anxious. His speech is not delayed. He does not exhibit a depressed mood.  Nursing note and vitals reviewed.        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 Placement of acetabular polymethylmethacrylate spacer sp  Nearly 6 weeks of IV cefazolin now on protracted keflex but with new diskitis of the T spine sp aspiration which seemed  to be responding to IV ancef and likely due to the original culprit MSSA, He has now finished his 8 weeks of IV ancef and on keflex 1 gram in am and 500 mg in the pm  I will recheck his labs today and plan on seeing him in 6 months time.  I do have some anxiety about his shoulder pain but I do not want to put him through an MRI with conscious sedation at this point in time.  Bilateral shoulder pain: Again concern is that he could have dissemination of his infection to these sites which we have not imaged recently.  He claims that corticosteroid injection helped on the right so may be this is a good sign though I do not want to be deceived by this.   Depression: This is dramatically improved with his pain control.

## 2017-02-25 LAB — BASIC METABOLIC PANEL WITH GFR
BUN/Creatinine Ratio: 11 (calc) (ref 6–22)
BUN: 16 mg/dL (ref 7–25)
CALCIUM: 8.9 mg/dL (ref 8.6–10.3)
CO2: 27 mmol/L (ref 20–32)
Chloride: 112 mmol/L — ABNORMAL HIGH (ref 98–110)
Creat: 1.47 mg/dL — ABNORMAL HIGH (ref 0.70–1.11)
GFR, EST AFRICAN AMERICAN: 50 mL/min/{1.73_m2} — AB (ref >=60)
GFR, EST NON AFRICAN AMERICAN: 43 mL/min/{1.73_m2} — AB (ref >=60)
Glucose, Bld: 98 mg/dL (ref 65–99)
POTASSIUM: 4.9 mmol/L (ref 3.5–5.3)
SODIUM: 144 mmol/L (ref 135–146)

## 2017-02-25 LAB — C-REACTIVE PROTEIN: CRP: 1.6 mg/L (ref <8.0)

## 2017-02-25 LAB — SEDIMENTATION RATE: SED RATE: 14 mm/h (ref 0–20)

## 2017-03-12 ENCOUNTER — Other Ambulatory Visit: Payer: Self-pay | Admitting: *Deleted

## 2017-03-12 DIAGNOSIS — T827XXD Infection and inflammatory reaction due to other cardiac and vascular devices, implants and grafts, subsequent encounter: Secondary | ICD-10-CM

## 2017-03-12 MED ORDER — CEPHALEXIN 500 MG PO CAPS: ORAL_CAPSULE | ORAL | 9 refills | Status: DC

## 2017-06-23 ENCOUNTER — Non-Acute Institutional Stay (SKILLED_NURSING_FACILITY): Payer: Medicare Other | Admitting: Internal Medicine

## 2017-06-23 ENCOUNTER — Encounter: Payer: Self-pay | Admitting: Internal Medicine

## 2017-06-23 DIAGNOSIS — R29898 Other symptoms and signs involving the musculoskeletal system: Secondary | ICD-10-CM

## 2017-06-23 DIAGNOSIS — N181 Chronic kidney disease, stage 1: Secondary | ICD-10-CM | POA: Diagnosis not present

## 2017-06-23 DIAGNOSIS — I480 Paroxysmal atrial fibrillation: Secondary | ICD-10-CM | POA: Diagnosis not present

## 2017-06-23 DIAGNOSIS — F322 Major depressive disorder, single episode, severe without psychotic features: Secondary | ICD-10-CM

## 2017-06-23 DIAGNOSIS — E1122 Type 2 diabetes mellitus with diabetic chronic kidney disease: Secondary | ICD-10-CM

## 2017-06-23 DIAGNOSIS — I10 Essential (primary) hypertension: Secondary | ICD-10-CM | POA: Diagnosis not present

## 2017-06-23 NOTE — Progress Notes (Signed)
: Provider:  Noah Delaine. [name], MD Location:  [facility] Room Number: 680H Place of Service:  SNF [phone])  PCP: Lilian Coma., MD Patient Care Team: Lilian Coma., MD as PCP - General (Internal Medicine) Tania Ade, MD as Consulting Physician (Orthopedic Surgery) Tommy Medal, Lavell Islam, MD as Consulting Physician (Infectious Diseases)  Extended Emergency Contact Information Primary Emergency Contact: Diana,[name] Address: [address]          [place], [place] [address] John[place] Litter of [place] Phone: [phone] Mobile Phone: [phone] Relation: Spouse Secondary Emergency Contact: [place],Kim  [place] Mobile Phone: [phone] Relation: Daughter     Allergies: Ace inhibitors; Gabapentin; Nsaids; and Zolpidem  Chief Complaint  Patient presents with  . New Admit To SNF    HPI: Patient is 84 y.o. male with atrial fibrillation, chronic kidney disease, depression, diabetes mellitus type 2, hypertension, hypothyroidism, numerous life-threatening infections including MSSA bacteremia, thoracic discitis and septic hip requiring extensive debridement who was admitted to skilled nursing facility 06/22/2017 for OT/PT from home.  While at skilled nursing facility patient will be followed for atrial fibrillation treated with ASA.  Diltiazem and Lopressor, hypertension treated with diltiazem Lopressor, telmisartan, and torsemide and diabetes mellitus treated with p.o. glitazone and statin.  Past Medical History:  Diagnosis Date  . Alzheimer's type dementia 12/07/2016  . Anxiety   . Arthritis   . Atrial fibrillation ([place])   . Atrial fibrillation ([place])   . Bacteremia   . Chronic right shoulder pain 06/17/2015  . CKD (chronic kidney disease)   . Complication of anesthesia    " Terrible Nightmares"  . Constipation   . Depression   . Diabetes mellitus without complication ([place])    Type II  . Dysrhythmia    Afib- had  pacemaker, had it removed- takes Aspirin 325- daily  . Encounter for removal of cardiac resynchronization therapy pacemaker   . Grief 06/17/2015  . History of blood product transfusion   . Hypertension   . Hypothyroidism   . Infected pacemaker ([place])   . Major depression 06/17/2015  . MSSA (methicillin susceptible Staphylococcus aureus) infection   . OAB (overactive bladder)   . Overactive bladder   . Pacemaker   . Renal disorder   . Septic hip ([place])   . Septicemia ([place])   . Severe depression ([place]) 12/07/2016  . Staphylococcus aureus infection   . Thoracic spine pain 11/28/2014    Past Surgical History:  Procedure Laterality Date  . COLONOSCOPY W/ POLYPECTOMY     small  . EYE SURGERY     bilateral  . HIP CLOSED REDUCTION Right 10/02/2012   Procedure: CLOSED REDUCTION HIP;  Surgeon: Hessie Dibble, MD;  Location: [facility];  Service: Orthopedics;  Laterality: Right;  . ICD LEAD REMOVAL Left 08/31/2012   Procedure: ICD LEAD REMOVAL;  Surgeon: Evans Lance, MD;  Location: [facility];  Service: Cardiovascular;  Laterality: Left;  . INCISION AND DRAINAGE HIP Right 09/02/2012   Procedure: IRRIGATION AND DEBRIDEMENT RIGHT HIP;  Surgeon: Kerin Salen, MD;  Location: [facility];  Service: Orthopedics;  Laterality: Right;  . INCISION AND DRAINAGE HIP Right 08/10/2014   Procedure: IRRIGATION AND DEBRIDEMENT RIGHT HIP SUPERFICIAL VS DEEP;  Surgeon: Frederik Pear, MD;  Location: [facility];  Service: Orthopedics;  Laterality: Right;  . JOINT REPLACEMENT    . LUMBAR LAMINECTOMY/DECOMPRESSION MICRODISCECTOMY  02/14/2012   Procedure: LUMBAR LAMINECTOMY/DECOMPRESSION MICRODISCECTOMY 1 LEVEL;  Surgeon: Eustace Moore,  MD;  Location: [facility];  Service: Neurosurgery;  Laterality: N/A;  Thoracic twelve - Lumbar one decompressive laminectomy.  Marland Kitchen PACEMAKER INSERTION    . RADIOLOGY WITH ANESTHESIA N/A 01/10/2015   Procedure: MRI LUMBAR SPINE WITH/WITHOUT   (RADIOLOGY WITH ANESTHESIA);  Surgeon: Medication Radiologist, MD;   Location: [facility];  Service: Radiology;  Laterality: N/A;  . TEE WITHOUT CARDIOVERSION N/A 08/30/2012   Procedure: TRANSESOPHAGEAL ECHOCARDIOGRAM (TEE);  Surgeon: Larey Dresser, MD;  Location: [facility];  Service: Cardiovascular;  Laterality: N/A;  . TONSILLECTOMY    . TOTAL HIP REVISION Right 09/02/2012   Procedure: REVISION OF POLYETHYLENE LINER AND FEMORAL HEAD ;  Surgeon: Kerin Salen, MD;  Location: [facility];  Service: Orthopedics;  Laterality: Right;  . TOTAL HIP REVISION Right 11/28/2012   Procedure: TOTAL HIP REVISION With Placement of Contrain Liner;  Surgeon: Kerin Salen, MD;  Location: [facility];  Service: Orthopedics;  Laterality: Right;    Allergies as of 06/23/2017      Reactions   Ace Inhibitors Other (See Comments)   Cough Cough   Gabapentin    Nsaids Other (See Comments)   Renal insuff   Zolpidem Other (See Comments)   hallucinations Nightmares, irritable, nervouse      Medication List        Accurate as of 06/23/17 11:59 PM. Always use your most recent med list.          ALPRAZolam 0.5 MG tablet Commonly known as:  XANAX Take 0.5 mg by mouth daily as needed for anxiety.   aspirin 325 MG EC tablet Take 325 mg by mouth daily.   cephALEXin 500 MG capsule Commonly known as:  KEFLEX TAKE 2 CAPSULES BY MOUTH  TWO TIMES DAILY   cyclobenzaprine 10 MG tablet Commonly known as:  FLEXERIL Take 5 mg by mouth 3 (three) times daily as needed for muscle spasms.   diltiazem 120 MG 24 hr capsule Commonly known as:  CARDIZEM CD Take 120 mg by mouth daily.   docusate sodium 100 MG capsule Commonly known as:  COLACE Take 100 mg by mouth daily as needed for mild constipation.   DULoxetine 60 MG capsule Commonly known as:  CYMBALTA Take 60 mg by mouth daily.   finasteride 5 MG tablet Commonly known as:  PROSCAR Take 5 mg by mouth at bedtime.   HYDROcodone-acetaminophen 10-325 MG tablet Commonly known as:  NORCO Take 1 tablet by mouth 2 (two) times daily.     lactulose 10 GM/15ML solution Commonly known as:  CHRONULAC Take 20 g by mouth daily.   levothyroxine 88 MCG tablet Commonly known as:  SYNTHROID, LEVOTHROID Take 88 mcg by mouth daily before breakfast.   lovastatin 40 MG tablet Commonly known as:  MEVACOR Take 40 mg by mouth at bedtime. Reported on 04/16/2015   metoprolol succinate 25 MG 24 hr tablet Commonly known as:  TOPROL-XL Take 12.5 mg by mouth daily.   naloxone 4 MG/0.1ML Liqd nasal spray kit Commonly known as:  NARCAN Place 1 spray into the nose as needed.   naproxen sodium 220 MG tablet Commonly known as:  ALEVE Take 220 mg by mouth daily.   ONE TOUCH ULTRA TEST test strip Generic drug:  glucose blood   pioglitazone 15 MG tablet Commonly known as:  ACTOS Take 15 mg by mouth daily.   potassium chloride SA 20 MEQ tablet Commonly known as:  K-DUR,KLOR-CON Take 10 mEq by mouth 2 (two) times daily.   PROBIOTIC DAILY  PO Take 1 capsule by mouth daily.   telmisartan 40 MG tablet Commonly known as:  MICARDIS Take 40 mg by mouth daily.   terazosin 5 MG capsule Commonly known as:  HYTRIN Take 5 mg by mouth at bedtime.   torsemide 20 MG tablet Commonly known as:  DEMADEX Take 20 mg by mouth daily.       No orders of the defined types were placed in this encounter.   Immunization History  Administered Date(s) Administered  . DTaP 08/10/2014  . Influenza, High Dose Seasonal PF 10/19/2014  . Influenza,inj,Quad PF,6+ Mos 10/20/2012, 12/10/2015  . Influenza-Unspecified 10/20/2012, 10/28/2014, 12/16/2015, 11/12/2016  . Pneumococcal Conjugate-13 10/19/2014  . Pneumococcal Polysaccharide-23 12/10/2015  . Pneumococcal-Unspecified 11/27/2011, 10/28/2014    Social History   Tobacco Use  . Smoking status: Never Smoker  . Smokeless tobacco: Never Used  Substance Use Topics  . Alcohol use: No    Alcohol/week: 0.0 oz    Family history is   Family History  Problem Relation Age of Onset  . Heart failure  Mother       Review of Systems  DATA OBTAINED: from patient, nurse GENERAL:  no fevers, fatigue, appetite changes SKIN: No itching, or rash EYES: No eye pain, redness, discharge EARS: No earache, tinnitus, change in hearing NOSE: No congestion, drainage or bleeding  MOUTH/THROAT: No mouth or tooth pain, No sore throat RESPIRATORY: No cough, wheezing, SOB CARDIAC: No chest pain, palpitations, lower extremity edema  GI: No abdominal pain, No N/V/D or constipation, No heartburn or reflux  GU: No dysuria, frequency or urgency, or incontinence  MUSCULOSKELETAL: No unrelieved bone/joint pain NEUROLOGIC: No headache, dizziness or focal weakness PSYCHIATRIC: No c/o anxiety or sadness   Vitals:   06/23/17 0948  BP: 113/61  Pulse: 60  Resp: 18  Temp: (!) 96.7 F (35.9 C)  SpO2: 97%    SpO2 Readings from Last 1 Encounters:  06/23/17 97%   Body mass index is 33.89 kg/m.     Physical Exam  GENERAL APPEARANCE: Alert, conversant,  No acute distress.  SKIN: No diaphoresis rash HEAD: Normocephalic, atraumatic  EYES: Conjunctiva/lids clear. Pupils round, reactive. EOMs intact.  EARS: External exam WNL, canals clear. Hearing grossly normal.  NOSE: No deformity or discharge.  MOUTH/THROAT: Lips w/o lesions  RESPIRATORY: Breathing is even, unlabored. Lung sounds are clear   CARDIOVASCULAR: Heart RRR no murmurs, rubs or gallops. No peripheral edema.   GASTROINTESTINAL: Abdomen is soft, non-tender, not distended w/ normal bowel sounds. GENITOURINARY: Bladder non tender, not distended  MUSCULOSKELETAL: No abnormal joints or musculature NEUROLOGIC:  Cranial nerves 2-12 grossly intact. Moves all extremities  PSYCHIATRIC: Mood and affect appropriate to situation, no behavioral issues  Patient Active Problem List   Diagnosis Date Noted  . Severe depression ([place]) 12/07/2016  . Alzheimer's type dementia 12/07/2016  . Anxiety 01/11/2016  . Other chronic pain 06/17/2015  . Grief  06/17/2015  . Major depression 06/17/2015  . Type 2 diabetes mellitus with complication ([place])   . Discitis 01/10/2015  . Osteomyelitis of thoracic spine ([place]) 01/10/2015  . Thoracic spine pain 11/28/2014  . Elevated PSA 11/08/2014  . Prosthetic hip infection ([place]) 02/16/2013  . MSSA (methicillin susceptible Staphylococcus aureus) infection   . Infected pacemaker ([place])   . Closed dislocation of right hip ([place]) 10/02/2012    Class: Acute  . Retroperitoneal fluid collection 08/28/2012  . Staphylococcus aureus bacteremia 08/27/2012  . Hypokalemia 08/26/2012  . DM type 2 (diabetes mellitus, type 2) ([place]) 08/26/2012  .  Thrombocytopenia ([place]) 03/21/2012  . Hypothyroidism 02/14/2012  . Hyperlipidemia LDL goal <100 02/14/2012  . Benign prostatic hyperplasia 02/14/2012  . Lower extremity weakness 02/11/2012  . Anemia in chronic kidney disease 02/11/2012  . Chronic kidney disease, stage III (moderate) ([place]) 02/11/2012  . Atrial fibrillation ([place]) 02/11/2012  . Spinal stenosis 02/11/2012  . Hip dislocation, right ([place]) 11/30/2010  . Essential hypertension 11/30/2010      Labs reviewed: Basic Metabolic Panel:    Component Value Date/Time   NA 144 02/24/2017 1554   NA 143 03/21/2012 1426   K 4.9 02/24/2017 1554   K 4.0 03/21/2012 1426   CL 112 (H) 02/24/2017 1554   CL 101 03/21/2012 1426   CO2 27 02/24/2017 1554   CO2 31 (H) 03/21/2012 1426   GLUCOSE 98 02/24/2017 1554   GLUCOSE 123 (H) 03/21/2012 1426   BUN 16 02/24/2017 1554   BUN 43.7 Repeated and Verified (H) 03/21/2012 1426   CREATININE 1.47 (H) 02/24/2017 1554   CREATININE 1.9 Repeated and Verified (H) 03/21/2012 1426   CALCIUM 8.9 02/24/2017 1554   CALCIUM 9.3 03/21/2012 1426   PROT 6.3 06/17/2015 1216   PROT 6.9 03/21/2012 1426   ALBUMIN 3.6 06/17/2015 1216   ALBUMIN 3.2 (L) 03/21/2012 1426   AST 19 06/17/2015 1216   AST 18 03/21/2012 1426   ALT 12 06/17/2015 1216   ALT 15 03/21/2012 1426   ALKPHOS 91 06/17/2015  1216   ALKPHOS 74 03/21/2012 1426   BILITOT 0.5 06/17/2015 1216   BILITOT 0.42 03/21/2012 1426   GFRNONAA 43 (L) 02/24/2017 1554   GFRAA 50 (L) 02/24/2017 1554    Recent Labs    12/07/16 1112 02/24/17 1554  NA 143 144  K 5.0 4.9  CL 111* 112*  CO2 26 27  GLUCOSE 110* 98  BUN 19 16  CREATININE 1.56* 1.47*  CALCIUM 9.1 8.9   Liver Function Tests: No results for input(s): AST, ALT, ALKPHOS, BILITOT, PROT, ALBUMIN in the last 8760 hours. No results for input(s): LIPASE, AMYLASE in the last 8760 hours. No results for input(s): AMMONIA in the last 8760 hours. CBC: Recent Labs    12/07/16 1112  WBC 6.5  NEUTROABS 4,446  HGB 11.2*  HCT 33.7*  MCV 96.3  PLT 126*   Lipid No results for input(s): CHOL, HDL, LDLCALC, TRIG in the last 8760 hours.  Cardiac Enzymes: No results for input(s): CKTOTAL, CKMB, CKMBINDEX, TROPONINI in the last 8760 hours. BNP: No results for input(s): BNP in the last 8760 hours. No results found for: [facility] Lab Results  Component Value Date   HGBA1C 6.0 (H) 01/10/2015   Lab Results  Component Value Date   TSH 2.763 01/10/2015   Lab Results  Component Value Date   BTDHRCBU38 453 03/21/2012   Lab Results  Component Value Date   FOLATE >20.0 03/21/2012   Lab Results  Component Value Date   IRON 84 03/21/2012   TIBC 214 (L) 03/21/2012   FERRITIN 292 03/21/2012    Imaging and Procedures obtained prior to SNF admission: Mr Thoracic Spine W Wo Contrast  Addendum Date: 01/10/2015   ADDENDUM REPORT: 01/10/2015 14:36 ADDENDUM: Study discussed by telephone with Dr. Rhina Brackett DAM on 01/10/2015 at 1416 hours. Electronically Signed   By: Genevie Ann M.D.   On: 01/10/2015 14:36  Result Date: 01/10/2015 CLINICAL DATA:  84 year old male with MSSA bacteremia in 2014 resulting in large right paraspinal abscess, right hip osteomyelitis and septic hip arthroplasty. Those required right hip  surgery and removal of pacemaker. Recurrent MSSA  infection, status post 6 weeks of IV antibiotics but new thoracic spine and rib pain. Subsequent encounter. Exam under anesthesia. EXAM: MRI THORACIC AND LUMBAR SPINE WITHOUT AND WITH CONTRAST TECHNIQUE: Multiplanar and multiecho pulse sequences of the thoracic and lumbar spine were obtained without and with intravenous contrast. CONTRAST:  51m MULTIHANCE GADOBENATE DIMEGLUMINE 529 MG/ML IV SOLN COMPARISON:  Lumbar spine CT 08/27/2012. Thoracic spine CT 02/13/2012. FINDINGS: MR THORACIC SPINE FINDINGS Limited sagittal imaging of the cervical spine is remarkable for chronic disc and endplate degeneration, but no cervical spine marrow signal changes. Bulky cervical endplate osteophytes, probably resulting in 1 0 more levels of cervical ankylosis. Similar bulky thoracic endplate osteophytes with suspected multilevel thoracic ankylosis. Abnormal fluid signal in the T8-T9 disc and tracking ventral through what appears to be a fracture of the anterior inferior T9 vertebral body corner series 400, image 11). Surrounding marrow edema and abnormal vertebral body enhancement there is trace disc fluid signal with abnormal enhancement also at T8-T9 (series 400, image 11). However there is no T10 superior or T8 inferior T8 endplate edema or enhancement. The T12-L1 level is described in the lumbar section below. No other acute osseous abnormality in the thoracic spine. No thoracic spinal stenosis above T12. Thoracic spinal cord above T12 appears normal. No abnormal thoracic intradural enhancement. No associated paraspinal abscess. Abnormal signal in the lungs dependently. Given the associated anesthesia for this study favor this represents atelectasis with trace pleural fluid. Dolichoectatic descending thoracic aorta with atherosclerosis, but otherwise appears negative. Trace fluid in the trachea related to intubation for exam under anesthesia. MR LUMBAR SPINE FINDINGS Bulky lumbar endplate osteophytosis with multilevel  associated lumbar ankylosis similar to that in the cervical and thoracic spine. Transitional lumbosacral anatomy with a partially lumbarized S1 level (see series 1600, image 10). At the T12-L1 level there is abnormal fluid throughout the disc space, more so anteriorly. There is mild endplate marrow edema and enhancement. Evidence of previous surgery posteriorly at this level. Degenerative multifactorial spinal stenosis with mild spinal cord mass effect (series 1600, image 11). No definite abnormal cord signal here which is just above the conus which terminates at L1-L2. There is small volume abnormal fluid signal and enhancement in the anterior and left lateral disc space at L4-L5 (series 1700, image 11). No associated endplate enhancement. Small enhancing degenerative Schmorl node at the L5 inferior endplate incidentally noted. No other acute osseous lesion or suspicious marrow changes. Visible sacrum intact. Partially visible bladder distension.  Cholelithiasis. IMPRESSION: 1. Widespread spinal ankylosis, favor related to diffuse idiopathic skeletal hyperostosis. Chronic postoperative changes to the posterior elements of the thoracolumbar junction and throughout the lower lumbar spine. 2. Acute discitis osteomyelitis at T9-T10, T12-L1. Early discitis at T8-T9 and L4-L5. No epidural or paraspinal abscess. 3. Superimposed degenerative spinal stenosis at T12-L1 with spinal cord mass effect but no definite cord signal abnormality. 4. Abnormal lung bases favor due to atelectasis and trace pleural effusions. Electronically Signed: By: HGenevie AnnM.D. On: 01/10/2015 14:07   Mr Lumbar Spine W Wo Contrast  Addendum Date: 01/10/2015   ADDENDUM REPORT: 01/10/2015 14:36 ADDENDUM: Study discussed by telephone with Dr. CRhina BrackettDAM on 01/10/2015 at 1416 hours. Electronically Signed   By: HGenevie AnnM.D.   On: 01/10/2015 14:36  Result Date: 01/10/2015 CLINICAL DATA:  84year old male with MSSA bacteremia in 2014 resulting  in large right paraspinal abscess, right hip osteomyelitis and septic hip arthroplasty. Those required right hip  surgery and removal of pacemaker. Recurrent MSSA infection, status post 6 weeks of IV antibiotics but new thoracic spine and rib pain. Subsequent encounter. Exam under anesthesia. EXAM: MRI THORACIC AND LUMBAR SPINE WITHOUT AND WITH CONTRAST TECHNIQUE: Multiplanar and multiecho pulse sequences of the thoracic and lumbar spine were obtained without and with intravenous contrast. CONTRAST:  59m MULTIHANCE GADOBENATE DIMEGLUMINE 529 MG/ML IV SOLN COMPARISON:  Lumbar spine CT 08/27/2012. Thoracic spine CT 02/13/2012. FINDINGS: MR THORACIC SPINE FINDINGS Limited sagittal imaging of the cervical spine is remarkable for chronic disc and endplate degeneration, but no cervical spine marrow signal changes. Bulky cervical endplate osteophytes, probably resulting in 1 0 more levels of cervical ankylosis. Similar bulky thoracic endplate osteophytes with suspected multilevel thoracic ankylosis. Abnormal fluid signal in the T8-T9 disc and tracking ventral through what appears to be a fracture of the anterior inferior T9 vertebral body corner series 400, image 11). Surrounding marrow edema and abnormal vertebral body enhancement there is trace disc fluid signal with abnormal enhancement also at T8-T9 (series 400, image 11). However there is no T10 superior or T8 inferior T8 endplate edema or enhancement. The T12-L1 level is described in the lumbar section below. No other acute osseous abnormality in the thoracic spine. No thoracic spinal stenosis above T12. Thoracic spinal cord above T12 appears normal. No abnormal thoracic intradural enhancement. No associated paraspinal abscess. Abnormal signal in the lungs dependently. Given the associated anesthesia for this study favor this represents atelectasis with trace pleural fluid. Dolichoectatic descending thoracic aorta with atherosclerosis, but otherwise appears negative.  Trace fluid in the trachea related to intubation for exam under anesthesia. MR LUMBAR SPINE FINDINGS Bulky lumbar endplate osteophytosis with multilevel associated lumbar ankylosis similar to that in the cervical and thoracic spine. Transitional lumbosacral anatomy with a partially lumbarized S1 level (see series 1600, image 10). At the T12-L1 level there is abnormal fluid throughout the disc space, more so anteriorly. There is mild endplate marrow edema and enhancement. Evidence of previous surgery posteriorly at this level. Degenerative multifactorial spinal stenosis with mild spinal cord mass effect (series 1600, image 11). No definite abnormal cord signal here which is just above the conus which terminates at L1-L2. There is small volume abnormal fluid signal and enhancement in the anterior and left lateral disc space at L4-L5 (series 1700, image 11). No associated endplate enhancement. Small enhancing degenerative Schmorl node at the L5 inferior endplate incidentally noted. No other acute osseous lesion or suspicious marrow changes. Visible sacrum intact. Partially visible bladder distension.  Cholelithiasis. IMPRESSION: 1. Widespread spinal ankylosis, favor related to diffuse idiopathic skeletal hyperostosis. Chronic postoperative changes to the posterior elements of the thoracolumbar junction and throughout the lower lumbar spine. 2. Acute discitis osteomyelitis at T9-T10, T12-L1. Early discitis at T8-T9 and L4-L5. No epidural or paraspinal abscess. 3. Superimposed degenerative spinal stenosis at T12-L1 with spinal cord mass effect but no definite cord signal abnormality. 4. Abnormal lung bases favor due to atelectasis and trace pleural effusions. Electronically Signed: By: HGenevie AnnM.D. On: 01/10/2015 14:07   Ir Fluoro Guide Cv Line Right  Result Date: 01/11/2015 INDICATION: Patient with history of discitis/osteomyelitis of thoracolumbar spine; central venous access is requested for fluids and  medications. EXAM: ULTRASOUND AND FLUOROSCOPIC GUIDED PICC LINE INSERTION MEDICATIONS: None. CONTRAST:  None FLUOROSCOPY TIME:  48 seconds. COMPLICATIONS: None immediate TECHNIQUE: The procedure, risks, benefits, and alternatives were explained to the patient and informed written consent was obtained. A timeout was performed prior to the initiation of the  procedure. The right upper extremity was prepped with chlorhexidine in a sterile fashion, and a sterile drape was applied covering the operative field. Maximum barrier sterile technique with sterile gowns and gloves were used for the procedure. A timeout was performed prior to the initiation of the procedure. Local anesthesia was provided with 1% lidocaine. Under direct ultrasound guidance, the right basilic vein was accessed with a micropuncture kit after the overlying soft tissues were anesthetized with 1% lidocaine. An ultrasound image was saved for documentation purposes. A guidewire was advanced to the level of the superior caval-atrial junction for measurement purposes and the PICC line was cut to length. A peel-away sheath was placed and a 44 cm, 5 [place], dual lumen was inserted to level of the superior caval-atrial junction. A post procedure spot fluoroscopic was obtained. The catheter easily aspirated and flushed and was sutured in place. A dressing was placed. The patient tolerated the procedure well without immediate post procedural complication. FINDINGS: After catheter placement, the tip lies within the superior cavoatrial junction. The catheter aspirates and flushes normally and is ready for immediate use. IMPRESSION: Successful ultrasound and fluoroscopic guided placement of a right basilic vein approach, 44 cm, 5 French, dual lumen PICC with tip at the superior caval-atrial junction. The PICC line is ready for immediate use. Read by: Rowe Robert, PA-C Electronically Signed   By: Marybelle Killings M.D.   On: 01/11/2015 13:58   Ir US Guide Vasc Access  Right  Result Date: 01/11/2015 INDICATION: Patient with history of discitis/osteomyelitis of thoracolumbar spine; central venous access is requested for fluids and medications. EXAM: ULTRASOUND AND FLUOROSCOPIC GUIDED PICC LINE INSERTION MEDICATIONS: None. CONTRAST:  None FLUOROSCOPY TIME:  48 seconds. COMPLICATIONS: None immediate TECHNIQUE: The procedure, risks, benefits, and alternatives were explained to the patient and informed written consent was obtained. A timeout was performed prior to the initiation of the procedure. The right upper extremity was prepped with chlorhexidine in a sterile fashion, and a sterile drape was applied covering the operative field. Maximum barrier sterile technique with sterile gowns and gloves were used for the procedure. A timeout was performed prior to the initiation of the procedure. Local anesthesia was provided with 1% lidocaine. Under direct ultrasound guidance, the right basilic vein was accessed with a micropuncture kit after the overlying soft tissues were anesthetized with 1% lidocaine. An ultrasound image was saved for documentation purposes. A guidewire was advanced to the level of the superior caval-atrial junction for measurement purposes and the PICC line was cut to length. A peel-away sheath was placed and a 44 cm, 5 [place], dual lumen was inserted to level of the superior caval-atrial junction. A post procedure spot fluoroscopic was obtained. The catheter easily aspirated and flushed and was sutured in place. A dressing was placed. The patient tolerated the procedure well without immediate post procedural complication. FINDINGS: After catheter placement, the tip lies within the superior cavoatrial junction. The catheter aspirates and flushes normally and is ready for immediate use. IMPRESSION: Successful ultrasound and fluoroscopic guided placement of a right basilic vein approach, 44 cm, 5 French, dual lumen PICC with tip at the superior caval-atrial junction.  The PICC line is ready for immediate use. Read by: Rowe Robert, PA-C Electronically Signed   By: Marybelle Killings M.D.   On: 01/11/2015 13:58   Ir Fluoro Guide Ndl Plmt / Bx  Result Date: 01/11/2015 CLINICAL DATA:  T12-L1 discitis EXAM: IR FLUORO GUIDE NEEDLE PLACEMENT /BIOPSY FLUOROSCOPY TIME:  3 minutes and 36 seconds  MEDICATIONS AND MEDICAL HISTORY: Versed 2 mg, Fentanyl 100 mcg. ANESTHESIA/SEDATION: Moderate sedation time: 15 minutes CONTRAST:  None PROCEDURE: The procedure, risks, benefits, and alternatives were explained to the patient. Questions regarding the procedure were encouraged and answered. The patient understands and consents to the procedure. The back was prepped with Betadine in a sterile fashion, and a sterile drape was applied covering the operative field. A sterile gown and sterile gloves were used for the procedure. Under fluoroscopic guidance, an 18 gauge needle was inserted into the T12-L1 disc space via left posterior lateral approach. 2 cc serosanguineous fluid was aspirated. FINDINGS: Images document needle placement in the T12-L1 disc space. COMPLICATIONS: None IMPRESSION: Successful T12-L1 disc aspiration. Electronically Signed   By: Marybelle Killings M.D.   On: 01/11/2015 14:59     Not all labs, radiology exams or other studies done during hospitalization come through on my EPIC note; however they are reviewed by me.    Assessment and Plan  Bilateral leg weakness- patient is admitted for OT/PT  atrial fibrillation-stable; continue prophylaxis with aspirin 325 mg daily and rate control with Cartia 120 mg daily with Lopressor 12.5 mg daily   Hypertension-stable; continue Cartia XL 120 mg daily, Lopressor 12.5 mg daily Demadex 20 mg daily and telmisartan 40 mg daily  Diabetes mellitus type 2 - controlled; continue Actos 50 mg daily- recently controlled; continue Cymbalta 60 mg daily  Depression- reasonably controlled; continue Cymbalta 60 mg daily    Time spent greater  than 45 minutes;> 50% of time with patient was spent reviewing records, labs, tests and studies, counseling and developing plan of care  Webb Silversmith D. [name],  MD

## 2017-06-26 LAB — BASIC METABOLIC PANEL
BUN: 20 (ref 4–21)
Creatinine: 1.1 (ref 0.6–1.3)
GLUCOSE: 88
Potassium: 4.7 (ref 3.4–5.3)
Sodium: 144 (ref 137–147)

## 2017-06-27 ENCOUNTER — Encounter: Payer: Self-pay | Admitting: Internal Medicine

## 2017-06-27 DIAGNOSIS — R29898 Other symptoms and signs involving the musculoskeletal system: Secondary | ICD-10-CM | POA: Insufficient documentation

## 2017-07-19 ENCOUNTER — Non-Acute Institutional Stay (SKILLED_NURSING_FACILITY): Payer: Medicare Other | Admitting: Internal Medicine

## 2017-07-19 ENCOUNTER — Encounter: Payer: Self-pay | Admitting: Internal Medicine

## 2017-07-19 DIAGNOSIS — I480 Paroxysmal atrial fibrillation: Secondary | ICD-10-CM

## 2017-07-19 DIAGNOSIS — I10 Essential (primary) hypertension: Secondary | ICD-10-CM | POA: Diagnosis not present

## 2017-07-19 DIAGNOSIS — N400 Enlarged prostate without lower urinary tract symptoms: Secondary | ICD-10-CM | POA: Diagnosis not present

## 2017-07-19 NOTE — Progress Notes (Signed)
Location:  [facility] Room Number: [facility]:  SNF [phone])  Provider: Noah Delaine. [name], MD  Lilian Coma., MD  Patient Care Team: Lilian Coma., MD as PCP - General (Internal Medicine) Tania Ade, MD as Consulting Physician (Orthopedic Surgery) Tommy Medal, Lavell Islam, MD as Consulting Physician (Infectious Diseases)  Extended Emergency Contact Information Primary Emergency Contact: Alcalde,[name] Address: [address]          [place], [place] [address] Johnnette Litter of [place] Phone: [phone] Mobile Phone: [phone] Relation: Spouse Secondary Emergency Contact: [place],Kim  [place] Mobile Phone: [phone] Relation: Daughter    Allergies: Ace inhibitors; Gabapentin; Nsaids; and Zolpidem  Chief Complaint  Patient presents with  . Medical Management of Chronic Issues    HPI: Patient is 84 y.o. male who is being seen for routine issues of hypertension, atrial fibrillation, and BPH.  Past Medical History:  Diagnosis Date  . Alzheimer's type dementia 12/07/2016  . Anxiety   . Arthritis   . Atrial fibrillation ([place])   . Atrial fibrillation ([place])   . Bacteremia   . Chronic right shoulder pain 06/17/2015  . CKD (chronic kidney disease)   . Complication of anesthesia    " Terrible Nightmares"  . Constipation   . Depression   . Diabetes mellitus without complication ([place])    Type II  . Dysrhythmia    Afib- had pacemaker, had it removed- takes Aspirin 325- daily  . Encounter for removal of cardiac resynchronization therapy pacemaker   . Grief 06/17/2015  . History of blood product transfusion   . Hypertension   . Hypothyroidism   . Infected pacemaker ([place])   . Major depression 06/17/2015  . MSSA (methicillin susceptible Staphylococcus aureus) infection   . OAB (overactive bladder)   . Overactive bladder   . Pacemaker   . Renal disorder   . Septic hip ([place])   . Septicemia ([place])   .  Severe depression ([place]) 12/07/2016  . Staphylococcus aureus infection   . Thoracic spine pain 11/28/2014    Past Surgical History:  Procedure Laterality Date  . COLONOSCOPY W/ POLYPECTOMY     small  . EYE SURGERY     bilateral  . HIP CLOSED REDUCTION Right 10/02/2012   Procedure: CLOSED REDUCTION HIP;  Surgeon: Hessie Dibble, MD;  Location: [facility];  Service: Orthopedics;  Laterality: Right;  . ICD LEAD REMOVAL Left 08/31/2012   Procedure: ICD LEAD REMOVAL;  Surgeon: Evans Lance, MD;  Location: [facility];  Service: Cardiovascular;  Laterality: Left;  . INCISION AND DRAINAGE HIP Right 09/02/2012   Procedure: IRRIGATION AND DEBRIDEMENT RIGHT HIP;  Surgeon: Kerin Salen, MD;  Location: [facility];  Service: Orthopedics;  Laterality: Right;  . INCISION AND DRAINAGE HIP Right 08/10/2014   Procedure: IRRIGATION AND DEBRIDEMENT RIGHT HIP SUPERFICIAL VS DEEP;  Surgeon: Frederik Pear, MD;  Location: [facility];  Service: Orthopedics;  Laterality: Right;  . JOINT REPLACEMENT    . LUMBAR LAMINECTOMY/DECOMPRESSION MICRODISCECTOMY  02/14/2012   Procedure: LUMBAR LAMINECTOMY/DECOMPRESSION MICRODISCECTOMY 1 LEVEL;  Surgeon: Eustace Moore, MD;  Location: [facility];  Service: Neurosurgery;  Laterality: N/A;  Thoracic twelve - Lumbar one decompressive laminectomy.  Marland Kitchen PACEMAKER INSERTION    . RADIOLOGY WITH ANESTHESIA N/A 01/10/2015   Procedure: MRI LUMBAR SPINE WITH/WITHOUT   (RADIOLOGY WITH ANESTHESIA);  Surgeon: Medication Radiologist, MD;  Location: [facility];  Service: Radiology;  Laterality: N/A;  . TEE WITHOUT  CARDIOVERSION N/A 08/30/2012   Procedure: TRANSESOPHAGEAL ECHOCARDIOGRAM (TEE);  Surgeon: Larey Dresser, MD;  Location: [facility];  Service: Cardiovascular;  Laterality: N/A;  . TONSILLECTOMY    . TOTAL HIP REVISION Right 09/02/2012   Procedure: REVISION OF POLYETHYLENE LINER AND FEMORAL HEAD ;  Surgeon: Kerin Salen, MD;  Location: [facility];  Service: Orthopedics;  Laterality: Right;  . TOTAL HIP REVISION Right  11/28/2012   Procedure: TOTAL HIP REVISION With Placement of Contrain Liner;  Surgeon: Kerin Salen, MD;  Location: [facility];  Service: Orthopedics;  Laterality: Right;    Allergies as of 07/19/2017      Reactions   Ace Inhibitors Other (See Comments)   Cough Cough   Gabapentin    Nsaids Other (See Comments)   Renal insuff   Zolpidem Other (See Comments)   hallucinations Nightmares, irritable, nervouse      Medication List        Accurate as of 07/19/17 11:59 PM. Always use your most recent med list.          aspirin 325 MG EC tablet Take 325 mg by mouth daily.   cyclobenzaprine 10 MG tablet Commonly known as:  FLEXERIL Take 5 mg by mouth 3 (three) times daily as needed for muscle spasms. Also give one tablet at bedtime.   diltiazem 120 MG 24 hr capsule Commonly known as:  CARDIZEM CD Take 120 mg by mouth daily.   docusate sodium 100 MG capsule Commonly known as:  COLACE Take 100 mg by mouth daily as needed for mild constipation.   DULoxetine 60 MG capsule Commonly known as:  CYMBALTA Take 60 mg by mouth daily.   finasteride 5 MG tablet Commonly known as:  PROSCAR Take 5 mg by mouth at bedtime.   HYDROcodone-acetaminophen 10-325 MG tablet Commonly known as:  NORCO Take 1 tablet by mouth 2 (two) times daily.   lactulose 10 GM/15ML solution Commonly known as:  CHRONULAC Take 45 g by mouth daily.   levothyroxine 88 MCG tablet Commonly known as:  SYNTHROID, LEVOTHROID Take 88 mcg by mouth daily before breakfast.   lovastatin 40 MG tablet Commonly known as:  MEVACOR Take 40 mg by mouth at bedtime. Reported on 04/16/2015   metoprolol succinate 25 MG 24 hr tablet Commonly known as:  TOPROL-XL Take 6.25 mg by mouth daily.   naloxone 4 MG/0.1ML Liqd nasal spray kit Commonly known as:  NARCAN Place 1 spray into the nose as needed.   naproxen sodium 220 MG tablet Commonly known as:  ALEVE Take 220 mg by mouth daily.   ONE TOUCH ULTRA TEST test strip Generic  drug:  glucose blood   potassium chloride SA 20 MEQ tablet Commonly known as:  K-DUR,KLOR-CON Take 10 mEq by mouth 2 (two) times daily. Also take 1 tablet extra on Mondays and Thursdays twice daily.   PROBIOTIC DAILY PO Take 1 capsule by mouth daily.   telmisartan 40 MG tablet Commonly known as:  MICARDIS Take 40 mg by mouth daily.   terazosin 5 MG capsule Commonly known as:  HYTRIN Take 5 mg by mouth at bedtime.   torsemide 20 MG tablet Commonly known as:  DEMADEX Take 20 mg by mouth daily. Also take an additional tablet on Mondays and Thursdays.       No orders of the defined types were placed in this encounter.   Immunization History  Administered Date(s) Administered  . DTaP 08/10/2014  . Influenza, High Dose Seasonal PF 10/19/2014  .  Influenza,inj,Quad PF,6+ Mos 10/20/2012, 12/10/2015  . Influenza-Unspecified 10/20/2012, 10/28/2014, 12/16/2015, 11/12/2016  . Pneumococcal Conjugate-13 10/19/2014  . Pneumococcal Polysaccharide-23 12/10/2015  . Pneumococcal-Unspecified 11/27/2011, 10/28/2014    Social History   Tobacco Use  . Smoking status: Never Smoker  . Smokeless tobacco: Never Used  Substance Use Topics  . Alcohol use: No    Alcohol/week: 0.0 oz    Review of Systems  DATA OBTAINED: from patient, nurse, medical record, family member GENERAL:  no fevers, fatigue, appetite changes SKIN: No itching, rash HEENT: No complaint RESPIRATORY: No cough, wheezing, SOB CARDIAC: No chest pain, palpitations, lower extremity edema  GI: No abdominal pain, No N/V/D or constipation, No heartburn or reflux  GU: No dysuria, frequency or urgency, or incontinence  MUSCULOSKELETAL: No unrelieved bone/joint pain NEUROLOGIC: No headache, dizziness  PSYCHIATRIC: No overt anxiety or sadness  Vitals:   07/19/17 0940  BP: 120/67  Pulse: 80  Resp: 16  Temp: 98 F (36.7 C)  SpO2: 98%   Body mass index is 36.6 kg/m. Physical Exam  GENERAL APPEARANCE: Alert,  conversant, No acute distress  SKIN: No diaphoresis rash HEENT: Unremarkable RESPIRATORY: Breathing is even, unlabored. Lung sounds are clear   CARDIOVASCULAR: Heart RRR no murmurs, rubs or gallops. No peripheral edema  GASTROINTESTINAL: Abdomen is soft, non-tender, not distended w/ normal bowel sounds.  GENITOURINARY: Bladder non tender, not distended  MUSCULOSKELETAL: No abnormal joints or musculature NEUROLOGIC: Cranial nerves 2-12 grossly intact. Moves all extremities PSYCHIATRIC: Mood and affect appropriate to situation, no behavioral issues  Patient Active Problem List   Diagnosis Date Noted  . Bilateral leg weakness 06/27/2017  . Depression 12/07/2016  . Alzheimer's type dementia 12/07/2016  . Anxiety 01/11/2016  . Other chronic pain 06/17/2015  . Grief 06/17/2015  . Major depression 06/17/2015  . Type 2 diabetes mellitus with complication ([place])   . Discitis 01/10/2015  . Osteomyelitis of thoracic spine ([place]) 01/10/2015  . Thoracic spine pain 11/28/2014  . Elevated PSA 11/08/2014  . Prosthetic hip infection ([place]) 02/16/2013  . MSSA (methicillin susceptible Staphylococcus aureus) infection   . Infected pacemaker ([place])   . Closed dislocation of right hip ([place]) 10/02/2012    Class: Acute  . Retroperitoneal fluid collection 08/28/2012  . Staphylococcus aureus bacteremia 08/27/2012  . Hypokalemia 08/26/2012  . DM type 2 (diabetes mellitus, type 2) ([place]) 08/26/2012  . Thrombocytopenia ([place]) 03/21/2012  . Hypothyroidism 02/14/2012  . Hyperlipidemia LDL goal <100 02/14/2012  . Benign prostatic hyperplasia 02/14/2012  . Lower extremity weakness 02/11/2012  . Anemia in chronic kidney disease 02/11/2012  . Chronic kidney disease, stage III (moderate) ([place]) 02/11/2012  . Atrial fibrillation ([place]) 02/11/2012  . Spinal stenosis 02/11/2012  . Hip dislocation, right ([place]) 11/30/2010  . Essential hypertension 11/30/2010    CMP     Component Value Date/Time   NA 144 06/26/2017     NA 143 03/21/2012 1426   K 4.7 06/26/2017   K 4.0 03/21/2012 1426   CL 112 (H) 02/24/2017 1554   CL 101 03/21/2012 1426   CO2 27 02/24/2017 1554   CO2 31 (H) 03/21/2012 1426   GLUCOSE 98 02/24/2017 1554   GLUCOSE 123 (H) 03/21/2012 1426   BUN 20 06/26/2017   BUN 43.7 Repeated and Verified (H) 03/21/2012 1426   CREATININE 1.1 06/26/2017   CREATININE 1.47 (H) 02/24/2017 1554   CREATININE 1.9 Repeated and Verified (H) 03/21/2012 1426   CALCIUM 8.9 02/24/2017 1554   CALCIUM 9.3 03/21/2012 1426   PROT 6.3 06/17/2015  1216   PROT 6.9 03/21/2012 1426   ALBUMIN 3.6 06/17/2015 1216   ALBUMIN 3.2 (L) 03/21/2012 1426   AST 19 06/17/2015 1216   AST 18 03/21/2012 1426   ALT 12 06/17/2015 1216   ALT 15 03/21/2012 1426   ALKPHOS 91 06/17/2015 1216   ALKPHOS 74 03/21/2012 1426   BILITOT 0.5 06/17/2015 1216   BILITOT 0.42 03/21/2012 1426   GFRNONAA 43 (L) 02/24/2017 1554   GFRAA 50 (L) 02/24/2017 1554   Recent Labs    12/07/16 1112 02/24/17 1554 06/26/17  NA 143 144 144  K 5.0 4.9 4.7  CL 111* 112*  --   CO2 26 27  --   GLUCOSE 110* 98  --   BUN '19 16 20  ' CREATININE 1.56* 1.47* 1.1  CALCIUM 9.1 8.9  --    No results for input(s): AST, ALT, ALKPHOS, BILITOT, PROT, ALBUMIN in the last 8760 hours. Recent Labs    12/07/16 1112  WBC 6.5  NEUTROABS 4,446  HGB 11.2*  HCT 33.7*  MCV 96.3  PLT 126*   No results for input(s): CHOL, LDLCALC, TRIG in the last 8760 hours.  Invalid input(s): HCL No results found for: [facility] Lab Results  Component Value Date   TSH 2.763 01/10/2015   Lab Results  Component Value Date   HGBA1C 6.0 (H) 01/10/2015   No results found for: CHOL, HDL, LDLCALC, LDLDIRECT, TRIG, CHOLHDL  Significant Diagnostic Results in last 30 days:  No results found.  Assessment and Plan  Essential hypertension Controlled on multiple meds; plan to continue Toprol-XL 6.25 mg  daily, terazosin 5 mg daily, Micardis 40 mg daily diltiazem 120 mg daily and  Demadex 10 mg daily  Atrial fibrillation ([facility]) Rate control with Toprol-XL 6.25 mg p.o. daily and prophylaxis with aspirin 325 mg daily  Benign prostatic hyperplasia No reported infections; continue Proscar 5 mg daily and Hytrin 5 mg daily    [name] D. [name], MD

## 2017-07-23 ENCOUNTER — Non-Acute Institutional Stay (SKILLED_NURSING_FACILITY): Payer: Medicare Other | Admitting: Internal Medicine

## 2017-07-23 ENCOUNTER — Encounter: Payer: Self-pay | Admitting: Internal Medicine

## 2017-07-23 DIAGNOSIS — R29898 Other symptoms and signs involving the musculoskeletal system: Secondary | ICD-10-CM

## 2017-07-23 DIAGNOSIS — E1122 Type 2 diabetes mellitus with diabetic chronic kidney disease: Secondary | ICD-10-CM

## 2017-07-23 DIAGNOSIS — I480 Paroxysmal atrial fibrillation: Secondary | ICD-10-CM | POA: Diagnosis not present

## 2017-07-23 DIAGNOSIS — I10 Essential (primary) hypertension: Secondary | ICD-10-CM

## 2017-07-23 DIAGNOSIS — N181 Chronic kidney disease, stage 1: Secondary | ICD-10-CM | POA: Diagnosis not present

## 2017-07-23 DIAGNOSIS — F329 Major depressive disorder, single episode, unspecified: Secondary | ICD-10-CM

## 2017-07-23 DIAGNOSIS — F32A Depression, unspecified: Secondary | ICD-10-CM

## 2017-07-23 NOTE — Progress Notes (Signed)
Location:  [facility] Room Number: 105-P Place of Service:  SNF (31)   [name]. [name], MD  PCP: Lilian Coma., MD Patient Care Team: Lilian Coma., MD as PCP - General (Internal Medicine) Tania Ade, MD as Consulting Physician (Orthopedic Surgery) Tommy Medal, Lavell Islam, MD as Consulting Physician (Infectious Diseases)  Extended Emergency Contact Information Primary Emergency Contact: [name],[name] Address: [address]          [place], [place] [address] Johnnette Litter of [place] Phone: [phone] Mobile Phone: [phone] Relation: Spouse Secondary Emergency Contact: Arlie Solom[place] Mobile Phone: [phone] Relation: Daughter  Allergies  Allergen Reactions  . Ace Inhibitors Other (See Comments)    Cough Cough   . Gabapentin   . Nsaids Other (See Comments)    Renal insuff  . Zolpidem Other (See Comments)    hallucinations Nightmares, irritable, nervouse    Chief Complaint  Patient presents with  . Discharge Note    Dischare from [employer] [employer]    HPI:  84 y.o. male with atrial fibrillation, chronic kidney disease, depression, diabetes mellitus type 2, hypertension, hypothyroidism, and numerous life-threatening infections including MSSA bacteremia, thoracic discitis, and septic hip, requiring extensive debridement who was admitted to skilled nursing facility on 06/22/2017 for OT/PT from home.  Patient is now ready to be discharged to home.    Past Medical History:  Diagnosis Date  . Alzheimer's type dementia 12/07/2016  . Anxiety   . Arthritis   . Atrial fibrillation ([place])   . Atrial fibrillation ([place])   . Bacteremia   . Chronic right shoulder pain 06/17/2015  . CKD (chronic kidney disease)   . Complication of anesthesia    " Terrible Nightmares"  . Constipation   . Depression   . Diabetes mellitus without complication ([place])    Type II  . Dysrhythmia    Afib- had pacemaker, had  it removed- takes Aspirin 325- daily  . Encounter for removal of cardiac resynchronization therapy pacemaker   . Grief 06/17/2015  . History of blood product transfusion   . Hypertension   . Hypothyroidism   . Infected pacemaker ([place])   . Major depression 06/17/2015  . MSSA (methicillin susceptible Staphylococcus aureus) infection   . OAB (overactive bladder)   . Overactive bladder   . Pacemaker   . Renal disorder   . Septic hip ([place])   . Septicemia ([place])   . Severe depression ([place]) 12/07/2016  . Staphylococcus aureus infection   . Thoracic spine pain 11/28/2014    Past Surgical History:  Procedure Laterality Date  . COLONOSCOPY W/ POLYPECTOMY     small  . EYE SURGERY     bilateral  . HIP CLOSED REDUCTION Right 10/02/2012   Procedure: CLOSED REDUCTION HIP;  Surgeon: Hessie Dibble, MD;  Location: [facility];  Service: Orthopedics;  Laterality: Right;  . ICD LEAD REMOVAL Left 08/31/2012   Procedure: ICD LEAD REMOVAL;  Surgeon: Evans Lance, MD;  Location: [facility];  Service: Cardiovascular;  Laterality: Left;  . INCISION AND DRAINAGE HIP Right 09/02/2012   Procedure: IRRIGATION AND DEBRIDEMENT RIGHT HIP;  Surgeon: Kerin Salen, MD;  Location: [facility];  Service: Orthopedics;  Laterality: Right;  . INCISION AND DRAINAGE HIP Right 08/10/2014   Procedure: IRRIGATION AND DEBRIDEMENT RIGHT HIP SUPERFICIAL VS DEEP;  Surgeon: Frederik Pear, MD;  Location: [facility];  Service: Orthopedics;  Laterality: Right;  . JOINT REPLACEMENT    .  LUMBAR LAMINECTOMY/DECOMPRESSION MICRODISCECTOMY  02/14/2012   Procedure: LUMBAR LAMINECTOMY/DECOMPRESSION MICRODISCECTOMY 1 LEVEL;  Surgeon: Eustace Moore, MD;  Location: [facility];  Service: Neurosurgery;  Laterality: N/A;  Thoracic twelve - Lumbar one decompressive laminectomy.  Marland Kitchen PACEMAKER INSERTION    . RADIOLOGY WITH ANESTHESIA N/A 01/10/2015   Procedure: MRI LUMBAR SPINE WITH/WITHOUT   (RADIOLOGY WITH ANESTHESIA);  Surgeon: Medication Radiologist, MD;  Location: [facility];   Service: Radiology;  Laterality: N/A;  . TEE WITHOUT CARDIOVERSION N/A 08/30/2012   Procedure: TRANSESOPHAGEAL ECHOCARDIOGRAM (TEE);  Surgeon: Larey Dresser, MD;  Location: [facility];  Service: Cardiovascular;  Laterality: N/A;  . TONSILLECTOMY    . TOTAL HIP REVISION Right 09/02/2012   Procedure: REVISION OF POLYETHYLENE LINER AND FEMORAL HEAD ;  Surgeon: Kerin Salen, MD;  Location: [facility];  Service: Orthopedics;  Laterality: Right;  . TOTAL HIP REVISION Right 11/28/2012   Procedure: TOTAL HIP REVISION With Placement of Contrain Liner;  Surgeon: Kerin Salen, MD;  Location: [facility];  Service: Orthopedics;  Laterality: Right;     reports that he has never smoked. He has never used smokeless tobacco. He reports that he does not drink alcohol or use drugs. Social History   Socioeconomic History  . Marital status: Married    Spouse name: Not on file  . Number of children: Not on file  . Years of education: Not on file  . Highest education level: Not on file  Occupational History  . Not on file  Social Needs  . Financial resource strain: Not on file  . Food insecurity:    Worry: Not on file    Inability: Not on file  . Transportation needs:    Medical: Not on file    Non-medical: Not on file  Tobacco Use  . Smoking status: Never Smoker  . Smokeless tobacco: Never Used  Substance and Sexual Activity  . Alcohol use: No    Alcohol/week: 0.0 oz  . Drug use: No  . Sexual activity: Not on file  Lifestyle  . Physical activity:    Days per week: Not on file    Minutes per session: Not on file  . Stress: Not on file  Relationships  . Social connections:    Talks on phone: Not on file    Gets together: Not on file    Attends religious service: Not on file    Active member of club or organization: Not on file    Attends meetings of clubs or organizations: Not on file    Relationship status: Not on file  . Intimate partner violence:    Fear of current or ex partner: Not on file     Emotionally abused: Not on file    Physically abused: Not on file    Forced sexual activity: Not on file  Other Topics Concern  . Not on file  Social History Narrative  . Not on file    Pertinent  Health Maintenance Due  Topic Date Due  . FOOT EXAM  09/22/2017 (Originally 06/10/1940)  . HEMOGLOBIN A1C  09/22/2017 (Originally 07/11/2015)  . OPHTHALMOLOGY EXAM  09/22/2017 (Originally 06/10/1940)  . INFLUENZA VACCINE  08/26/2017  . PNA vac Low Risk Adult  Completed    Medications: Allergies as of 07/23/2017      Reactions   Ace Inhibitors Other (See Comments)   Cough Cough   Gabapentin    Nsaids Other (See Comments)   Renal insuff   Zolpidem Other (See Comments)  hallucinations Nightmares, irritable, nervouse      Medication List        Accurate as of 07/23/17 11:59 PM. Always use your most recent med list.          aspirin 325 MG EC tablet Take 325 mg by mouth daily.   cyclobenzaprine 5 MG tablet Commonly known as:  FLEXERIL Take 5 mg by mouth 3 (three) times daily as needed for muscle spasms.   diltiazem 120 MG 24 hr capsule Commonly known as:  CARDIZEM CD Take 120 mg by mouth daily.   docusate sodium 100 MG capsule Commonly known as:  COLACE Take 100 mg by mouth daily as needed for mild constipation.   DULoxetine 60 MG capsule Commonly known as:  CYMBALTA Take 60 mg by mouth daily.   finasteride 5 MG tablet Commonly known as:  PROSCAR Take 5 mg by mouth at bedtime.   HYDROcodone-acetaminophen 10-325 MG tablet Commonly known as:  NORCO Take 1 tablet by mouth 2 (two) times daily.   lactulose 10 GM/15ML solution Commonly known as:  CHRONULAC Take 45 g by mouth daily.   levothyroxine 88 MCG tablet Commonly known as:  SYNTHROID, LEVOTHROID Take 88 mcg by mouth daily before breakfast.   lovastatin 40 MG tablet Commonly known as:  MEVACOR Take 40 mg by mouth at bedtime. Reported on 04/16/2015   metoprolol succinate 25 MG 24 hr tablet Commonly known  as:  TOPROL-XL Take 6.25 mg by mouth daily.   naloxone 4 MG/0.1ML Liqd nasal spray kit Commonly known as:  NARCAN Place 1 spray into the nose as needed.   naproxen sodium 220 MG tablet Commonly known as:  ALEVE Take 220 mg by mouth daily.   ONE TOUCH ULTRA TEST test strip Generic drug:  glucose blood   potassium chloride SA 20 MEQ tablet Commonly known as:  K-DUR,KLOR-CON Take 10 mEq by mouth 2 (two) times daily. Also take 1 tablet extra on Mondays and Thursdays twice daily.   PROBIOTIC DAILY PO Take 1 capsule by mouth daily.   telmisartan 40 MG tablet Commonly known as:  MICARDIS Take 40 mg by mouth daily.   terazosin 5 MG capsule Commonly known as:  HYTRIN Take 5 mg by mouth at bedtime.   torsemide 10 MG tablet Commonly known as:  DEMADEX Take 10 mg by mouth daily. With extra 10 mg po on Monday and Thursday        Vitals:   07/23/17 0927  BP: 124/66  Resp: 20  Temp: (!) 97.5 F (36.4 C)  Weight: 262 lb (118.8 kg)  Height: _0  (1.803 m)   Body mass index is 36.54 kg/m.  Physical Exam  GENERAL APPEARANCE: Alert, conversant. No acute distress.  HEENT: Unremarkable. RESPIRATORY: Breathing is even, unlabored. Lung sounds are clear   CARDIOVASCULAR: Heart RRR no murmurs, rubs or gallops. No peripheral edema.  GASTROINTESTINAL: Abdomen is soft, non-tender, not distended w/ normal bowel sounds.  NEUROLOGIC: Cranial nerves 2-12 grossly intact. Moves all extremities   Labs reviewed: Basic Metabolic Panel: Recent Labs    12/07/16 1112 02/24/17 1554 06/26/17  NA 143 144 144  K 5.0 4.9 4.7  CL 111* 112*  --   CO2 26 27  --   GLUCOSE 110* 98  --   BUN _1 CREATININE 1.56* 1.47* 1.1  CALCIUM 9.1 8.9  --    No results found for: [facility] Liver Function Tests: No results for input(s): AST, ALT, ALKPHOS, BILITOT, PROT, ALBUMIN in  the last 8760 hours. No results for input(s): LIPASE, AMYLASE in the last 8760 hours. No results for input(s):  AMMONIA in the last 8760 hours. CBC: Recent Labs    12/07/16 1112  WBC 6.5  NEUTROABS 4,446  HGB 11.2*  HCT 33.7*  MCV 96.3  PLT 126*   Lipid No results for input(s): CHOL, HDL, LDLCALC, TRIG in the last 8760 hours. Cardiac Enzymes: No results for input(s): CKTOTAL, CKMB, CKMBINDEX, TROPONINI in the last 8760 hours. BNP: No results for input(s): BNP in the last 8760 hours. CBG: No results for input(s): GLUCAP in the last 8760 hours.  Procedures and Imaging Studies During Stay: No results found.  Assessment/Plan:   Weakness of both lower extremities  Paroxysmal atrial fibrillation (HCC)  Essential hypertension  Type 2 diabetes mellitus with stage 1 chronic kidney disease, unspecified whether long term insulin use (HCC)  Bilateral leg weakness  Depression, unspecified depression type   Patient is being discharged with the following home health services: OT/PT/nursing/aid  Patient is being discharged with the following durable medical equipment: Semi-electric hospital bed, standard wheelchair, mechanical lift, rolling walker, bariatric bedside commode, extra long sliding board, shower bench  Patient has been advised to f/u with their PCP in 1-2 weeks to bring them up to date on their rehab stay.  Social services at facility was responsible for arranging this appointment.  Pt was provided with a 30 day supply of prescriptions for medications and refills must be obtained from their PCP.  For controlled substances, a more limited supply may be provided adequate until PCP appointment only.  Medications have been reconciled.  Spent greater than 30 minutes;> 50% of time with patient was spent reviewing records, labs, tests and studies, counseling and developing plan of care   [name]. [name], MD

## 2017-07-24 ENCOUNTER — Encounter: Payer: Self-pay | Admitting: Internal Medicine

## 2017-08-07 ENCOUNTER — Encounter: Payer: Self-pay | Admitting: Internal Medicine

## 2017-08-07 NOTE — Assessment & Plan Note (Signed)
Rate control with Toprol-XL 6.25 mg p.o. daily and prophylaxis with aspirin 325 mg daily

## 2017-08-07 NOTE — Assessment & Plan Note (Signed)
No reported infections; continue Proscar 5 mg daily and Hytrin 5 mg daily

## 2017-08-07 NOTE — Assessment & Plan Note (Addendum)
Controlled on multiple meds; plan to continue Toprol-XL 6.25 mg  daily, terazosin 5 mg daily, Micardis 40 mg daily diltiazem 120 mg daily and Demadex 10 mg daily

## 2017-08-12 ENCOUNTER — Emergency Department (HOSPITAL_COMMUNITY): Payer: Medicare Other

## 2017-08-12 ENCOUNTER — Emergency Department (HOSPITAL_COMMUNITY)
Admission: EM | Admit: 2017-08-12 | Discharge: 2017-08-12 | Disposition: A | Discharge status: Home / Self Care | Payer: Medicare Other | Attending: Emergency Medicine | Admitting: Emergency Medicine

## 2017-08-12 DIAGNOSIS — G309 Alzheimer's disease, unspecified: Secondary | ICD-10-CM | POA: Diagnosis not present

## 2017-08-12 DIAGNOSIS — N183 Chronic kidney disease, stage 3 (moderate): Secondary | ICD-10-CM | POA: Insufficient documentation

## 2017-08-12 DIAGNOSIS — K5900 Constipation, unspecified: Secondary | ICD-10-CM | POA: Diagnosis not present

## 2017-08-12 DIAGNOSIS — I129 Hypertensive chronic kidney disease with stage 1 through stage 4 chronic kidney disease, or unspecified chronic kidney disease: Secondary | ICD-10-CM | POA: Insufficient documentation

## 2017-08-12 DIAGNOSIS — F0280 Dementia in other diseases classified elsewhere without behavioral disturbance: Secondary | ICD-10-CM | POA: Insufficient documentation

## 2017-08-12 DIAGNOSIS — E1122 Type 2 diabetes mellitus with diabetic chronic kidney disease: Secondary | ICD-10-CM | POA: Insufficient documentation

## 2017-08-12 DIAGNOSIS — E039 Hypothyroidism, unspecified: Secondary | ICD-10-CM | POA: Insufficient documentation

## 2017-08-12 DIAGNOSIS — R1032 Left lower quadrant pain: Secondary | ICD-10-CM | POA: Diagnosis present

## 2017-08-12 DIAGNOSIS — Z7982 Long term (current) use of aspirin: Secondary | ICD-10-CM | POA: Insufficient documentation

## 2017-08-12 DIAGNOSIS — Z79899 Other long term (current) drug therapy: Secondary | ICD-10-CM | POA: Diagnosis not present

## 2017-08-12 LAB — CBC WITH DIFFERENTIAL/PLATELET
BASOS ABS: 0 10*3/uL (ref 0.0–0.1)
Basophils Relative: 0 %
EOS PCT: 4 %
Eosinophils Absolute: 0.3 10*3/uL (ref 0.0–0.7)
HCT: 34.8 % — ABNORMAL LOW (ref 39.0–52.0)
Hemoglobin: 11.2 g/dL — ABNORMAL LOW (ref 13.0–17.0)
LYMPHS ABS: 1.4 10*3/uL (ref 0.7–4.0)
LYMPHS PCT: 20 %
MCH: 31.8 pg (ref 26.0–34.0)
MCHC: 32.2 g/dL (ref 30.0–36.0)
MCV: 98.9 fL (ref 78.0–100.0)
MONO ABS: 0.9 10*3/uL (ref 0.1–1.0)
Monocytes Relative: 13 %
Neutro Abs: 4.4 10*3/uL (ref 1.7–7.7)
Neutrophils Relative %: 63 %
PLATELETS: 148 10*3/uL — AB (ref 150–400)
RBC: 3.52 MIL/uL — ABNORMAL LOW (ref 4.22–5.81)
RDW: 13.1 % (ref 11.5–15.5)
WBC: 7 10*3/uL (ref 4.0–10.5)

## 2017-08-12 LAB — COMPREHENSIVE METABOLIC PANEL
ALT: 12 U/L (ref 0–44)
AST: 19 U/L (ref 15–41)
Albumin: 3.3 g/dL — ABNORMAL LOW (ref 3.5–5.0)
Alkaline Phosphatase: 99 U/L (ref 38–126)
Anion gap: 9 (ref 5–15)
BUN: 22 mg/dL (ref 8–23)
CHLORIDE: 106 mmol/L (ref 98–111)
CO2: 28 mmol/L (ref 22–32)
Calcium: 8.8 mg/dL — ABNORMAL LOW (ref 8.9–10.3)
Creatinine, Ser: 1.53 mg/dL — ABNORMAL HIGH (ref 0.61–1.24)
GFR, EST AFRICAN AMERICAN: 46 mL/min — AB (ref >60)
GFR, EST NON AFRICAN AMERICAN: 40 mL/min — AB (ref >60)
Glucose, Bld: 111 mg/dL — ABNORMAL HIGH (ref 70–99)
POTASSIUM: 4.1 mmol/L (ref 3.5–5.1)
Sodium: 143 mmol/L (ref 135–145)
Total Bilirubin: 0.7 mg/dL (ref 0.3–1.2)
Total Protein: 6.1 g/dL — ABNORMAL LOW (ref 6.5–8.1)

## 2017-08-12 LAB — URINALYSIS, ROUTINE W REFLEX MICROSCOPIC
BILIRUBIN URINE: NEGATIVE
Glucose, UA: NEGATIVE mg/dL
HGB URINE DIPSTICK: NEGATIVE
Ketones, ur: NEGATIVE mg/dL
Nitrite: NEGATIVE
Protein, ur: NEGATIVE mg/dL
SPECIFIC GRAVITY, URINE: 1.012 (ref 1.005–1.030)
pH: 5 (ref 5.0–8.0)

## 2017-08-12 LAB — LIPASE, BLOOD: LIPASE: 24 U/L (ref 11–51)

## 2017-08-12 MED ORDER — SORBITOL 70 % SOLN
960.0000 mL | TOPICAL_OIL | Freq: Once | ORAL | Status: AC
  Administered 2017-08-12: 960 mL via RECTAL
  Filled 2017-08-12: qty 473

## 2017-08-12 MED ORDER — MAGNESIUM CITRATE PO SOLN
1.0000 | Freq: Once | ORAL | Status: AC
  Administered 2017-08-12: 1 via ORAL
  Filled 2017-08-12: qty 296

## 2017-08-12 NOTE — ED Provider Notes (Signed)
[facility]-EMERGENCY DEPT Provider Note   CSN: [id number] Arrival date & time: 08/12/17  1616     History   Chief Complaint Chief Complaint  Patient presents with  . Abdominal Pain    LLQ    HPI [name] is a 84 y.o. male.  Pt presents to the ED today with abdominal pain.  Pt said pain started around 1300.  Pt has not had a bm in 4 days.  He said he's been taking his usual colace and lactulose which have not been helping.  Pt does take chronic opiates as well.  Pt denies n/v.  No f/c.     Past Medical History:  Diagnosis Date  . Alzheimer's type dementia 12/07/2016  . Anxiety   . Arthritis   . Atrial fibrillation (HCC)   . Atrial fibrillation (HCC)   . Bacteremia   . Chronic right shoulder pain 06/17/2015  . CKD (chronic kidney disease)   . Complication of anesthesia    " Terrible Nightmares"  . Constipation   . Depression   . Diabetes mellitus without complication (HCC)    Type II  . Dysrhythmia    Afib- had pacemaker, had it removed- takes Aspirin 325- daily  . Encounter for removal of cardiac resynchronization therapy pacemaker   . Grief 06/17/2015  . History of blood product transfusion   . Hypertension   . Hypothyroidism   . Infected pacemaker (HCC)   . Major depression 06/17/2015  . MSSA (methicillin susceptible Staphylococcus aureus) infection   . OAB (overactive bladder)   . Overactive bladder   . Pacemaker   . Renal disorder   . Septic hip (HCC)   . Septicemia (HCC)   . Severe depression (HCC) 12/07/2016  . Staphylococcus aureus infection   . Thoracic spine pain 11/28/2014    Patient Active Problem List   Diagnosis Date Noted  . Bilateral leg weakness 06/27/2017  . Depression 12/07/2016  . Alzheimer's type dementia 12/07/2016  . Anxiety 01/11/2016  . Other chronic pain 06/17/2015  . Grief 06/17/2015  . Major depression 06/17/2015  . Type 2 diabetes mellitus with complication (HCC)   . Discitis 01/10/2015  .  Osteomyelitis of thoracic spine (HCC) 01/10/2015  . Thoracic spine pain 11/28/2014  . Elevated PSA 11/08/2014  . Prosthetic hip infection (HCC) 02/16/2013  . MSSA (methicillin susceptible Staphylococcus aureus) infection   . Infected pacemaker (HCC)   . Closed dislocation of right hip (HCC) 10/02/2012    Class: Acute  . Retroperitoneal fluid collection 08/28/2012  . Staphylococcus aureus bacteremia 08/27/2012  . Hypokalemia 08/26/2012  . DM type 2 (diabetes mellitus, type 2) (HCC) 08/26/2012  . Thrombocytopenia (HCC) 03/21/2012  . Hypothyroidism 02/14/2012  . Hyperlipidemia LDL goal <100 02/14/2012  . Benign prostatic hyperplasia 02/14/2012  . Lower extremity weakness 02/11/2012  . Anemia in chronic kidney disease 02/11/2012  . Chronic kidney disease, stage III (moderate) (HCC) 02/11/2012  . Atrial fibrillation (HCC) 02/11/2012  . Spinal stenosis 02/11/2012  . Hip dislocation, right (HCC) 11/30/2010  . Essential hypertension 11/30/2010    Past Surgical History:  Procedure Laterality Date  . COLONOSCOPY W/ POLYPECTOMY     small  . EYE SURGERY     bilateral  . HIP CLOSED REDUCTION Right 10/02/2012   Procedure: CLOSED REDUCTION HIP;  Surgeon: Velna Ochs, MD;  [facility];  Service: Orthopedics;  Laterality: Right;  . ICD LEAD REMOVAL Left 08/31/2012   Procedure: ICD LEAD REMOVAL;  Surgeon: Marinus Maw,  MD;  [facility];  Service: Cardiovascular;  Laterality: Left;  . INCISION AND DRAINAGE HIP Right 09/02/2012   Procedure: IRRIGATION AND DEBRIDEMENT RIGHT HIP;  Surgeon: Nestor Lewandowsky, MD;  [facility];  Service: Orthopedics;  Laterality: Right;  . INCISION AND DRAINAGE HIP Right 08/10/2014   Procedure: IRRIGATION AND DEBRIDEMENT RIGHT HIP SUPERFICIAL VS DEEP;  Surgeon: Gean Birchwood, MD;  [facility];  Service: Orthopedics;  Laterality: Right;  . JOINT REPLACEMENT    . LUMBAR LAMINECTOMY/DECOMPRESSION MICRODISCECTOMY  02/14/2012   Procedure: LUMBAR  LAMINECTOMY/DECOMPRESSION MICRODISCECTOMY 1 LEVEL;  Surgeon: Tia Alert, MD;  [facility];  Service: Neurosurgery;  Laterality: N/A;  Thoracic twelve - Lumbar one decompressive laminectomy.  Marland Kitchen PACEMAKER INSERTION    . RADIOLOGY WITH ANESTHESIA N/A 01/10/2015   Procedure: MRI LUMBAR SPINE WITH/WITHOUT   (RADIOLOGY WITH ANESTHESIA);  Surgeon: Medication Radiologist, MD;  [facility];  Service: Radiology;  Laterality: N/A;  . TEE WITHOUT CARDIOVERSION N/A 08/30/2012   Procedure: TRANSESOPHAGEAL ECHOCARDIOGRAM (TEE);  Surgeon: Laurey Morale, MD;  [facility];  Service: Cardiovascular;  Laterality: N/A;  . TONSILLECTOMY    . TOTAL HIP REVISION Right 09/02/2012   Procedure: REVISION OF POLYETHYLENE LINER AND FEMORAL HEAD ;  Surgeon: Nestor Lewandowsky, MD;  [facility];  Service: Orthopedics;  Laterality: Right;  . TOTAL HIP REVISION Right 11/28/2012   Procedure: TOTAL HIP REVISION With Placement of Contrain Liner;  Surgeon: Nestor Lewandowsky, MD;  [facility];  Service: Orthopedics;  Laterality: Right;        Home Medications    Prior to Admission medications   Medication Sig Start Date End Date Taking? Authorizing Provider  aspirin 325 MG EC tablet Take 325 mg by mouth daily.   Yes [provider]  cyclobenzaprine (FLEXERIL) 5 MG tablet Take 5 mg by mouth 3 (three) times daily as needed for muscle spasms.   Yes [provider]  diltiazem (CARDIZEM CD) 120 MG 24 hr capsule Take 120 mg by mouth daily. 07/09/14  Yes [provider]  docusate sodium (COLACE) 100 MG capsule Take 100 mg by mouth daily as needed for mild constipation.   Yes [provider]  DULoxetine (CYMBALTA) 60 MG capsule Take 60 mg by mouth daily.   Yes [provider]  finasteride (PROSCAR) 5 MG tablet Take 5 mg by mouth at bedtime.    Yes [provider]  HYDROcodone-acetaminophen (NORCO) 10-325 MG tablet Take 1 tablet by mouth 2 (two) times daily.    Yes [provider]  lactulose (CHRONULAC) 10 GM/15ML solution Take 45 g by mouth daily.    Yes [provider]  levothyroxine (SYNTHROID, LEVOTHROID) 88 MCG tablet Take 88 mcg by mouth daily before breakfast.   Yes [provider]  lovastatin (MEVACOR) 40 MG tablet Take 40 mg by mouth at bedtime. Reported on 04/16/2015 07/09/14  Yes [provider]  metoprolol succinate (TOPROL-XL) 25 MG 24 hr tablet Take 12.5 mg by mouth daily.  11/02/14  Yes [provider]  naloxone (NARCAN) nasal spray 4 mg/0.1 mL Place 1 spray into the nose as needed.   Yes [provider]  naproxen sodium (ALEVE) 220 MG tablet Take 220 mg by mouth daily.   Yes [provider]  potassium chloride SA (K-DUR,KLOR-CON) 20 MEQ tablet Take 10 mEq by mouth 2 (two) times daily. Also take 1 tablet extra on Mondays and Thursdays twice daily.   Yes [provider]  terazosin (HYTRIN) 5 MG capsule Take 5 mg by mouth at bedtime.  11/15/12  Yes [provider]  torsemide (DEMADEX) 10 MG tablet Take 10 mg by mouth daily. With extra 10 mg po on Monday and Thursday   Yes [provider]  ONE TOUCH ULTRA TEST test strip  11/07/14   [provider]  telmisartan (MICARDIS) 40 MG tablet Take 40 mg by mouth daily.    [provider]    Family History Family History  Problem Relation Age of Onset  . Heart failure Mother     Social History Social History   Tobacco Use  . Smoking status: Never Smoker  . Smokeless tobacco: Never Used  Substance Use Topics  . Alcohol use: No    Alcohol/week: 0.0 oz  . Drug use: No     Allergies   Ace inhibitors; Gabapentin; Nsaids; and Zolpidem   Review of Systems Review of Systems  Gastrointestinal: Positive for abdominal pain and constipation.  All other systems reviewed and are negative.    Physical Exam Updated Vital Signs BP (!) 110/97   Pulse 66   Temp 97.8 F (36.6 C) (Oral)    Resp 16   SpO2 100%   Physical Exam  Constitutional: He is oriented to person, place, and time. He appears well-developed and well-nourished.  HENT:  Head: Normocephalic and atraumatic.  Mouth/Throat: Oropharynx is clear and moist.  Eyes: Pupils are equal, round, and reactive to light. EOM are normal.  Cardiovascular: Normal rate, normal heart sounds and intact distal pulses. An irregularly irregular rhythm present.  Pulmonary/Chest: Effort normal and breath sounds normal.  Abdominal: Normal appearance and normal aorta. Bowel sounds are decreased. There is tenderness in the periumbilical area.  Neurological: He is alert and oriented to person, place, and time.  Skin: Skin is warm. Capillary refill takes less than 2 seconds.  Psychiatric: He has a normal mood and affect. His behavior is normal.  Nursing note and vitals reviewed.    ED Treatments / Results  Labs (all labs ordered are listed, but only abnormal results are displayed) Labs Reviewed  CBC WITH DIFFERENTIAL/PLATELET - Abnormal; Notable for the following components:      Result Value   RBC 3.52 (*)    Hemoglobin 11.2 (*)    HCT 34.8 (*)    Platelets 148 (*)    All other components within normal limits  COMPREHENSIVE METABOLIC PANEL - Abnormal; Notable for the following components:   Glucose, Bld 111 (*)    Creatinine, Ser 1.53 (*)    Calcium 8.8 (*)    Total Protein 6.1 (*)    Albumin 3.3 (*)    GFR calc non Af Amer 40 (*)    GFR calc Af Amer 46 (*)    All other components within normal limits  URINALYSIS, ROUTINE W REFLEX MICROSCOPIC - Abnormal; Notable for the following components:   Leukocytes, UA MODERATE (*)    Bacteria, UA FEW (*)    All other components within normal limits  LIPASE, BLOOD    EKG None  Radiology Dg Abdomen Acute W/chest  Result Date: 08/12/2017 CLINICAL DATA:  Left lower quadrant abdominal pain. EXAM: DG ABDOMEN ACUTE W/ 1V CHEST COMPARISON:  Chest CT from 11/26/2014 and pelvic  radiographs from 11/23/2012 FINDINGS: Atherosclerotic calcification of the aortic arch. Mild enlargement of the cardiopericardial silhouette, without appreciable edema. The lungs appear clear. Degenerative glenohumeral arthropathy and degenerative AC joint spurring noted. No free intraperitoneal gas observed beneath  the hemidiaphragms. Body habitus reduces diagnostic sensitivity and specificity. Scattered gas in the bowel without appreciable dilated bowel. Aortoiliac atherosclerotic vascular disease. Bony demineralization is present. No significant abnormal air-fluid levels. Methacrylate spacer in the right acetabular socket with absence of the right femoral head and neck, and 6.2 cm of proximal displacement of the right femur. Lumbar spondylosis and degenerative disc disease. IMPRESSION: 1. A specific cause for the patient's left-sided abdominal pain is not identified. 2. Unremarkable bowel gas pattern. 3. Mild enlargement of the cardiopericardial silhouette, without edema. 4.  Aortic Atherosclerosis (ICD10-I70.0). 5. Bony demineralization. 6. Chronic right hip deformity with the with methacrylate spacer in the right acetabular socket, and 6.2 cm proximal migration of the right femur. Electronically Signed   By: Gaylyn Rong M.D.   On: 08/12/2017 19:16    Procedures Procedures (including critical care time)  Medications Ordered in ED Medications  magnesium citrate solution 1 Bottle (has no administration in time range)  sorbitol, milk of mag, mineral oil, glycerin (SMOG) enema (960 mLs Rectal Given 08/12/17 1938)     Initial Impression / Assessment and Plan / ED Course  I have reviewed the triage vital signs and the nursing notes.  Pertinent labs & imaging results that were available during my care of the patient were reviewed by me and considered in my medical decision making (see chart for details).    Pt is feeling much better after SMOG enema.  The pt knows to return if worse.  Final  Clinical Impressions(s) / ED Diagnoses   Final diagnoses:  Constipation, unspecified constipation type    ED Discharge Orders    None       Jacalyn Lefevre, MD 08/12/17 2047

## 2017-08-12 NOTE — ED Notes (Signed)
Called [facility] and gave report to nurse

## 2017-08-12 NOTE — ED Notes (Signed)
Pt has large brown bowel movement after enema

## 2017-08-12 NOTE — ED Notes (Signed)
[facility]

## 2017-08-12 NOTE — ED Notes (Signed)
Bed: [facility] Expected date:  Expected time:  Means of arrival:  Comments: 

## 2017-08-12 NOTE — ED Triage Notes (Signed)
PT to ed via [facility] with complaints of LLQ pain. Pt stated the pain started at the sternum and now feels the pain localized in LLQ.

## 2017-08-13 ENCOUNTER — Other Ambulatory Visit: Payer: Self-pay

## 2017-08-13 ENCOUNTER — Emergency Department (HOSPITAL_COMMUNITY): Payer: Medicare Other

## 2017-08-13 ENCOUNTER — Emergency Department (HOSPITAL_COMMUNITY)
Admission: EM | Admit: 2017-08-13 | Discharge: 2017-08-13 | Disposition: A | Discharge status: Home / Self Care | Payer: Medicare Other | Attending: Emergency Medicine | Admitting: Emergency Medicine

## 2017-08-13 ENCOUNTER — Encounter (HOSPITAL_COMMUNITY): Payer: Self-pay

## 2017-08-13 DIAGNOSIS — E039 Hypothyroidism, unspecified: Secondary | ICD-10-CM | POA: Diagnosis not present

## 2017-08-13 DIAGNOSIS — M62838 Other muscle spasm: Secondary | ICD-10-CM | POA: Insufficient documentation

## 2017-08-13 DIAGNOSIS — N189 Chronic kidney disease, unspecified: Secondary | ICD-10-CM | POA: Diagnosis not present

## 2017-08-13 DIAGNOSIS — I4891 Unspecified atrial fibrillation: Secondary | ICD-10-CM | POA: Insufficient documentation

## 2017-08-13 DIAGNOSIS — Z79899 Other long term (current) drug therapy: Secondary | ICD-10-CM | POA: Diagnosis not present

## 2017-08-13 DIAGNOSIS — I129 Hypertensive chronic kidney disease with stage 1 through stage 4 chronic kidney disease, or unspecified chronic kidney disease: Secondary | ICD-10-CM | POA: Insufficient documentation

## 2017-08-13 DIAGNOSIS — E1122 Type 2 diabetes mellitus with diabetic chronic kidney disease: Secondary | ICD-10-CM | POA: Insufficient documentation

## 2017-08-13 DIAGNOSIS — F0390 Unspecified dementia without behavioral disturbance: Secondary | ICD-10-CM | POA: Diagnosis not present

## 2017-08-13 DIAGNOSIS — Z950 Presence of cardiac pacemaker: Secondary | ICD-10-CM | POA: Diagnosis not present

## 2017-08-13 DIAGNOSIS — M79652 Pain in left thigh: Secondary | ICD-10-CM | POA: Diagnosis present

## 2017-08-13 MED ORDER — METHOCARBAMOL 500 MG PO TABS
500.0000 mg | ORAL_TABLET | Freq: Once | ORAL | Status: AC
  Administered 2017-08-13: 500 mg via ORAL
  Filled 2017-08-13: qty 1

## 2017-08-13 MED ORDER — METHOCARBAMOL 500 MG PO TABS: 500.0000 mg | ORAL_TABLET | Freq: Three times a day (TID) | ORAL | 0 refills | Status: DC | PRN

## 2017-08-13 MED ORDER — HYDROCODONE-ACETAMINOPHEN 5-325 MG PO TABS
2.0000 | ORAL_TABLET | Freq: Once | ORAL | Status: AC
  Administered 2017-08-13: 2 via ORAL
  Filled 2017-08-13: qty 2

## 2017-08-13 NOTE — ED Triage Notes (Addendum)
Pt BIBA from [facility] stating suicidal thoughts. Pt did not express this en route, but instead stating that he needs Jesus for his pain. Pt c/o left leg pain tensing on and off.Pt is non ambulatory and does not have access to weapons at facility. VSS en route. 143/82 HR80 100% RA 115CBG. Pt denies SI with this RN, just wants the pain gone. Pt states that his left leg started hurting 6 weeks ago, like "my right leg hurt before it got operated on". Pt states that he fell 3 weeks ago.

## 2017-08-13 NOTE — ED Notes (Signed)
[facility] New brief placed on patient.

## 2017-08-13 NOTE — ED Notes (Signed)
Bed: [id number] Expected date:  Expected time:  Means of arrival:  Comments: 84 yo AMS, SI

## 2017-08-13 NOTE — ED Notes (Signed)
Per family, "this is all a show. He heard that if he stays in the hospital 3 nights, he can have Medicare pay for the nursing home. His financials are decimated."

## 2017-08-13 NOTE — ED Provider Notes (Signed)
Has flexeril at home   Azalia Bilisampos, Verlon Carcione, MD 08/13/17 1310

## 2017-08-13 NOTE — Discharge Instructions (Addendum)
Patient in not a threat to himself

## 2017-08-13 NOTE — ED Notes (Signed)
Pt now stating that he is concerned about paying his bills. Pt states that he can't do nothing now, he just has a bad leg.

## 2017-08-13 NOTE — ED Provider Notes (Signed)
[facility]-EMERGENCY DEPT Provider Note   CSN: [id number] Arrival date & time: 08/13/17  1051     History   Chief Complaint Chief Complaint  Patient presents with  . Leg Pain   Level 5 caveat: Dementia   HPI [name] is a 84 y.o. male.  HPI Patient presents emergency department reporting intermittent spasm in his left anterior thigh.  He states when the pain comes on it is severe in severity.  Reported possible fall 3 weeks ago.  Patient does not remember the event.  He has dementia and cannot provide any other significant reasonable history   Past Medical History:  Diagnosis Date  . Alzheimer's type dementia 12/07/2016  . Anxiety   . Arthritis   . Atrial fibrillation (HCC)   . Atrial fibrillation (HCC)   . Bacteremia   . Chronic right shoulder pain 06/17/2015  . CKD (chronic kidney disease)   . Complication of anesthesia    " Terrible Nightmares"  . Constipation   . Depression   . Diabetes mellitus without complication (HCC)    Type II  . Dysrhythmia    Afib- had pacemaker, had it removed- takes Aspirin 325- daily  . Encounter for removal of cardiac resynchronization therapy pacemaker   . Grief 06/17/2015  . History of blood product transfusion   . Hypertension   . Hypothyroidism   . Infected pacemaker (HCC)   . Major depression 06/17/2015  . MSSA (methicillin susceptible Staphylococcus aureus) infection   . OAB (overactive bladder)   . Overactive bladder   . Pacemaker   . Renal disorder   . Septic hip (HCC)   . Septicemia (HCC)   . Severe depression (HCC) 12/07/2016  . Staphylococcus aureus infection   . Thoracic spine pain 11/28/2014    Patient Active Problem List   Diagnosis Date Noted  . Bilateral leg weakness 06/27/2017  . Depression 12/07/2016  . Alzheimer's type dementia 12/07/2016  . Anxiety 01/11/2016  . Other chronic pain 06/17/2015  . Grief 06/17/2015  . Major depression 06/17/2015  . Type 2 diabetes mellitus  with complication (HCC)   . Discitis 01/10/2015  . Osteomyelitis of thoracic spine (HCC) 01/10/2015  . Thoracic spine pain 11/28/2014  . Elevated PSA 11/08/2014  . Prosthetic hip infection (HCC) 02/16/2013  . MSSA (methicillin susceptible Staphylococcus aureus) infection   . Infected pacemaker (HCC)   . Closed dislocation of right hip (HCC) 10/02/2012    Class: Acute  . Retroperitoneal fluid collection 08/28/2012  . Staphylococcus aureus bacteremia 08/27/2012  . Hypokalemia 08/26/2012  . DM type 2 (diabetes mellitus, type 2) (HCC) 08/26/2012  . Thrombocytopenia (HCC) 03/21/2012  . Hypothyroidism 02/14/2012  . Hyperlipidemia LDL goal <100 02/14/2012  . Benign prostatic hyperplasia 02/14/2012  . Lower extremity weakness 02/11/2012  . Anemia in chronic kidney disease 02/11/2012  . Chronic kidney disease, stage III (moderate) (HCC) 02/11/2012  . Atrial fibrillation (HCC) 02/11/2012  . Spinal stenosis 02/11/2012  . Hip dislocation, right (HCC) 11/30/2010  . Essential hypertension 11/30/2010    Past Surgical History:  Procedure Laterality Date  . COLONOSCOPY W/ POLYPECTOMY     small  . EYE SURGERY     bilateral  . HIP CLOSED REDUCTION Right 10/02/2012   Procedure: CLOSED REDUCTION HIP;  Surgeon: Velna Ochs, MD;  Location: [facility] OR;  Service: Orthopedics;  Laterality: Right;  . ICD LEAD REMOVAL Left 08/31/2012   Procedure: ICD LEAD REMOVAL;  Surgeon: Marinus Maw, MD;  Location: [facility] OR;  Service: Cardiovascular;  Laterality: Left;  . INCISION AND DRAINAGE HIP Right 09/02/2012   Procedure: IRRIGATION AND DEBRIDEMENT RIGHT HIP;  Surgeon: Nestor Lewandowsky, MD;  Location: [facility] OR;  Service: Orthopedics;  Laterality: Right;  . INCISION AND DRAINAGE HIP Right 08/10/2014   Procedure: IRRIGATION AND DEBRIDEMENT RIGHT HIP SUPERFICIAL VS DEEP;  Surgeon: Gean Birchwood, MD;  Location: [facility] OR;  Service: Orthopedics;  Laterality: Right;  . JOINT REPLACEMENT    . LUMBAR LAMINECTOMY/DECOMPRESSION  MICRODISCECTOMY  02/14/2012   Procedure: LUMBAR LAMINECTOMY/DECOMPRESSION MICRODISCECTOMY 1 LEVEL;  Surgeon: Tia Alert, MD;  Location: [facility] NEURO ORS;  Service: Neurosurgery;  Laterality: N/A;  Thoracic twelve - Lumbar one decompressive laminectomy.  Marland Kitchen PACEMAKER INSERTION    . RADIOLOGY WITH ANESTHESIA N/A 01/10/2015   Procedure: MRI LUMBAR SPINE WITH/WITHOUT   (RADIOLOGY WITH ANESTHESIA);  Surgeon: Medication Radiologist, MD;  Location: [facility] OR;  Service: Radiology;  Laterality: N/A;  . TEE WITHOUT CARDIOVERSION N/A 08/30/2012   Procedure: TRANSESOPHAGEAL ECHOCARDIOGRAM (TEE);  Surgeon: Laurey Morale, MD;  Location[facility] ENDOSCOPY;  Service: Cardiovascular;  Laterality: N/A;  . TONSILLECTOMY    . TOTAL HIP REVISION Right 09/02/2012   Procedure: REVISION OF POLYETHYLENE LINER AND FEMORAL HEAD ;  Surgeon: Nestor Lewandowsky, MD;  Location: [facility] OR;  Service: Orthopedics;  Laterality: Right;  . TOTAL HIP REVISION Right 11/28/2012   Procedure: TOTAL HIP REVISION With Placement of Contrain Liner;  Surgeon: Nestor Lewandowsky, MD;  Location: [facility] OR;  Service: Orthopedics;  Laterality: Right;        Home Medications    Prior to Admission medications   Medication Sig Start Date End Date Taking? Authorizing Provider  aspirin 325 MG EC tablet Take 325 mg by mouth daily.    [provider]  cyclobenzaprine (FLEXERIL) 5 MG tablet Take 5 mg by mouth 3 (three) times daily as needed for muscle spasms.    [provider]  diltiazem (CARDIZEM CD) 120 MG 24 hr capsule Take 120 mg by mouth daily. 07/09/14   [provider]  docusate sodium (COLACE) 100 MG capsule Take 100 mg by mouth daily as needed for mild constipation.    [provider]  DULoxetine (CYMBALTA) 60 MG capsule Take 60 mg by mouth daily.    [provider]  finasteride (PROSCAR) 5 MG tablet Take 5 mg by mouth at bedtime.     [provider]  HYDROcodone-acetaminophen (NORCO) 10-325 MG tablet Take 1 tablet by  mouth 2 (two) times daily.    [provider]  lactulose (CHRONULAC) 10 GM/15ML solution Take 45 g by mouth daily.     [provider]  levothyroxine (SYNTHROID, LEVOTHROID) 88 MCG tablet Take 88 mcg by mouth daily before breakfast.    [provider]  lovastatin (MEVACOR) 40 MG tablet Take 40 mg by mouth at bedtime. Reported on 04/16/2015 07/09/14   [provider]  metoprolol succinate (TOPROL-XL) 25 MG 24 hr tablet Take 12.5 mg by mouth daily.  11/02/14   [provider]  naloxone [facility]) nasal spray 4 mg/0.1 mL Place 1 spray into the nose as needed.    [provider]  naproxen sodium (ALEVE) 220 MG tablet Take 220 mg by mouth daily.    [provider]  ONE TOUCH ULTRA TEST test strip  11/07/14   [provider]  potassium chloride SA (K-DUR,KLOR-CON) 20 MEQ tablet Take 10 mEq by mouth 2 (two) times daily. Also take 1 tablet extra on Mondays and  Thursdays twice daily.    [provider]  telmisartan (MICARDIS) 40 MG tablet Take 40 mg by mouth daily.    [provider]  terazosin (HYTRIN) 5 MG capsule Take 5 mg by mouth at bedtime.  11/15/12   [provider]  torsemide (DEMADEX) 10 MG tablet Take 10 mg by mouth daily. With extra 10 mg po on Monday and Thursday    [provider]    Family History Family History  Problem Relation Age of Onset  . Heart failure Mother     Social History Social History   Tobacco Use  . Smoking status: Never Smoker  . Smokeless tobacco: Never Used  Substance Use Topics  . Alcohol use: No    Alcohol/week: 0.0 oz  . Drug use: No     Allergies   Ace inhibitors; Gabapentin; Nsaids; and Zolpidem   Review of Systems Review of Systems  Unable to perform ROS: Dementia     Physical Exam Updated Vital Signs BP 125/79 (BP Location: Left Arm)   Pulse 68   Temp (!) 97.4 F (36.3 C) (Oral)   Resp 16   Ht 5\' 11"  (1.803 m)   Wt 118.8 kg (262 lb)    SpO2 98%   BMI 36.54 kg/m   Physical Exam  Constitutional: He appears well-developed and well-nourished.  HENT:  Head: Normocephalic and atraumatic.  Eyes: EOM are normal.  Neck: Normal range of motion.  Cardiovascular: Normal rate, regular rhythm and normal heart sounds.  Pulmonary/Chest: Effort normal and breath sounds normal. No respiratory distress.  Abdominal: Soft. He exhibits no distension. There is no tenderness.  Musculoskeletal:  No significant swelling of the left lower extremity as compared to the right.  Normal PT and DP pulse in the left foot.  Left leg appears perfused.  Full range of motion of the left ankle, left knee, left hip.  No significant focal tenderness of the left anterior thigh.  No signs of infection.  No crepitus.  Compartments of left thigh are soft  Neurological: He is alert.  Skin: Skin is warm and dry.  Psychiatric: He has a normal mood and affect. Judgment normal.  Nursing note and vitals reviewed.      ED Treatments / Results  Labs (all labs ordered are listed, but only abnormal results are displayed) Labs Reviewed - No data to display  EKG None  Radiology   Dg Hip Unilat W Or Wo Pelvis 1 View Left  Result Date: 08/13/2017 CLINICAL DATA:  Fall at home with left hip and leg pain. Initial encounter. EXAM: DG HIP (WITH OR WITHOUT PELVIS) 3V*L* COMPARISON:  None. FINDINGS: No acute fracture or dislocation is identified. The pelvis appears intact without evidence of fracture or diastasis. Deformity of the right hip related to what appears to be prior arthroplasty with removal of hardware and replacement with a cement implant in the acetabulum. IMPRESSION: No acute fracture identified. Electronically Signed   By: Irish LackGlenn  Yamagata M.D.   On: 08/13/2017 12:21   Dg Femur Min 2 Views Left  Result Date: 08/13/2017 CLINICAL DATA:  Fall at home with left hip and leg pain. Initial encounter. EXAM: LEFT FEMUR 2 VIEWS COMPARISON:  None. FINDINGS: No acute  fracture identified. Marked degenerative disease present of the left knee. No bony lesions. Soft tissues appear unremarkable. There are vascular calcifications involving the SFA. IMPRESSION: No acute fracture. Significant degenerative disease of the left knee. Electronically Signed   By: Rudene AndaGlenn  Yamagata M.D.  On: 08/13/2017 12:24    Procedures Procedures (including critical care time)  Medications Ordered in ED Medications  methocarbamol (ROBAXIN) tablet 500 mg (has no administration in time range)  HYDROcodone-acetaminophen (NORCO/VICODIN) 5-325 MG per tablet 2 tablet (has no administration in time range)     Initial Impression / Assessment and Plan / ED Course  I have reviewed the triage vital signs and the nursing notes.  Pertinent labs & imaging results that were available during my care of the patient were reviewed by me and considered in my medical decision making (see chart for details).     Likely muscle spasm.  No signs of infection in the left lower extremity.  Vital signs are stable.  Normal pulses left foot.  Plain films without osseous abnormality.  Primary care follow-up.  Patient will be discharged home with primary care follow-up.  Final Clinical Impressions(s) / ED Diagnoses   Final diagnoses:  Muscle spasm    ED Discharge Orders    None       Azalia Bilis, MD 08/13/17 [phone]

## 2017-08-17 ENCOUNTER — Ambulatory Visit: Payer: Medicare Other | Admitting: Infectious Disease

## 2017-10-25 ENCOUNTER — Encounter (HOSPITAL_COMMUNITY): Payer: Self-pay | Admitting: Emergency Medicine

## 2017-10-25 ENCOUNTER — Emergency Department (HOSPITAL_COMMUNITY): Payer: Medicare Other

## 2017-10-25 ENCOUNTER — Emergency Department (HOSPITAL_COMMUNITY)
Admission: EM | Admit: 2017-10-25 | Discharge: 2017-10-25 | Disposition: A | Discharge status: Home / Self Care | Payer: Medicare Other | Attending: Emergency Medicine | Admitting: Emergency Medicine

## 2017-10-25 DIAGNOSIS — Z79899 Other long term (current) drug therapy: Secondary | ICD-10-CM | POA: Diagnosis not present

## 2017-10-25 DIAGNOSIS — Z96641 Presence of right artificial hip joint: Secondary | ICD-10-CM | POA: Insufficient documentation

## 2017-10-25 DIAGNOSIS — Z7984 Long term (current) use of oral hypoglycemic drugs: Secondary | ICD-10-CM | POA: Diagnosis not present

## 2017-10-25 DIAGNOSIS — N183 Chronic kidney disease, stage 3 (moderate): Secondary | ICD-10-CM | POA: Diagnosis not present

## 2017-10-25 DIAGNOSIS — Z950 Presence of cardiac pacemaker: Secondary | ICD-10-CM | POA: Insufficient documentation

## 2017-10-25 DIAGNOSIS — I129 Hypertensive chronic kidney disease with stage 1 through stage 4 chronic kidney disease, or unspecified chronic kidney disease: Secondary | ICD-10-CM | POA: Insufficient documentation

## 2017-10-25 DIAGNOSIS — R4182 Altered mental status, unspecified: Secondary | ICD-10-CM | POA: Diagnosis present

## 2017-10-25 DIAGNOSIS — G309 Alzheimer's disease, unspecified: Secondary | ICD-10-CM | POA: Insufficient documentation

## 2017-10-25 DIAGNOSIS — M6281 Muscle weakness (generalized): Secondary | ICD-10-CM | POA: Diagnosis not present

## 2017-10-25 DIAGNOSIS — Z7982 Long term (current) use of aspirin: Secondary | ICD-10-CM | POA: Diagnosis not present

## 2017-10-25 DIAGNOSIS — E039 Hypothyroidism, unspecified: Secondary | ICD-10-CM | POA: Diagnosis not present

## 2017-10-25 DIAGNOSIS — F0280 Dementia in other diseases classified elsewhere without behavioral disturbance: Secondary | ICD-10-CM | POA: Diagnosis not present

## 2017-10-25 DIAGNOSIS — R404 Transient alteration of awareness: Secondary | ICD-10-CM | POA: Insufficient documentation

## 2017-10-25 DIAGNOSIS — E1122 Type 2 diabetes mellitus with diabetic chronic kidney disease: Secondary | ICD-10-CM | POA: Diagnosis not present

## 2017-10-25 LAB — URINALYSIS, ROUTINE W REFLEX MICROSCOPIC
BILIRUBIN URINE: NEGATIVE
GLUCOSE, UA: NEGATIVE mg/dL
KETONES UR: NEGATIVE mg/dL
NITRITE: NEGATIVE
Protein, ur: NEGATIVE mg/dL
Specific Gravity, Urine: 1.01 (ref 1.005–1.030)
pH: 7 (ref 5.0–8.0)

## 2017-10-25 LAB — COMPREHENSIVE METABOLIC PANEL
ALK PHOS: 79 U/L (ref 38–126)
ALT: 14 U/L (ref 0–44)
ANION GAP: 10 (ref 5–15)
AST: 20 U/L (ref 15–41)
Albumin: 3.1 g/dL — ABNORMAL LOW (ref 3.5–5.0)
BUN: 27 mg/dL — ABNORMAL HIGH (ref 8–23)
CALCIUM: 8.9 mg/dL (ref 8.9–10.3)
CHLORIDE: 105 mmol/L (ref 98–111)
CO2: 26 mmol/L (ref 22–32)
Creatinine, Ser: 1.37 mg/dL — ABNORMAL HIGH (ref 0.61–1.24)
GFR calc non Af Amer: 45 mL/min — ABNORMAL LOW (ref >60)
GFR, EST AFRICAN AMERICAN: 53 mL/min — AB (ref >60)
Glucose, Bld: 114 mg/dL — ABNORMAL HIGH (ref 70–99)
Potassium: 4.4 mmol/L (ref 3.5–5.1)
SODIUM: 141 mmol/L (ref 135–145)
Total Bilirubin: 0.7 mg/dL (ref 0.3–1.2)
Total Protein: 5.9 g/dL — ABNORMAL LOW (ref 6.5–8.1)

## 2017-10-25 LAB — I-STAT VENOUS BLOOD GAS, ED
Acid-Base Excess: 4 mmol/L — ABNORMAL HIGH (ref 0.0–2.0)
Bicarbonate: 30.6 mmol/L — ABNORMAL HIGH (ref 20.0–28.0)
O2 Saturation: 68 %
PCO2 VEN: 53.6 mmHg (ref 44.0–60.0)
TCO2: 32 mmol/L (ref 22–32)
pH, Ven: 7.364 (ref 7.250–7.430)
pO2, Ven: 37 mmHg (ref 32.0–45.0)

## 2017-10-25 LAB — CBC
HCT: 32.4 % — ABNORMAL LOW (ref 39.0–52.0)
Hemoglobin: 9.8 g/dL — ABNORMAL LOW (ref 13.0–17.0)
MCH: 30.6 pg (ref 26.0–34.0)
MCHC: 30.2 g/dL (ref 30.0–36.0)
MCV: 101.3 fL — ABNORMAL HIGH (ref 78.0–100.0)
PLATELETS: 175 10*3/uL (ref 150–400)
RBC: 3.2 MIL/uL — ABNORMAL LOW (ref 4.22–5.81)
RDW: 14.1 % (ref 11.5–15.5)
WBC: 5.9 10*3/uL (ref 4.0–10.5)

## 2017-10-25 MED ORDER — SODIUM CHLORIDE 0.9 % IV BOLUS
500.0000 mL | Freq: Once | INTRAVENOUS | Status: AC
  Administered 2017-10-25: 500 mL via INTRAVENOUS

## 2017-10-25 NOTE — Discharge Instructions (Addendum)
Stop taking your xanax.  This is likely contributing to your confusion.  Please follow up with your doctor for recheck.

## 2017-10-25 NOTE — ED Provider Notes (Signed)
[name] EMERGENCY DEPARTMENT Provider Note   CSN: [id number] Arrival date & time: 10/25/17  1417     History   Chief Complaint Chief Complaint  Patient presents with  . Weakness    HPI [name] is a 84 y.o. male.  84 yo M with a chief complaint of altered mental status.  Per the nursing home he was less responsive today.  He has recently started on a new benzodiazepine as well as Lexapro.  Patient is somewhat sleepy on exam but does not have a current complaint.  The history is provided by the patient.  Weakness  This is a new problem. The current episode started less than 1 hour ago. The problem has not changed since onset.Pertinent negatives include no shortness of breath, no chest pain, no vomiting, no confusion and no headaches.    Past Medical History:  Diagnosis Date  . Alzheimer's type dementia 12/07/2016  . Anxiety   . Arthritis   . Atrial fibrillation (HCC)   . Atrial fibrillation (HCC)   . Bacteremia   . Chronic right shoulder pain 06/17/2015  . CKD (chronic kidney disease)   . Complication of anesthesia    " Terrible Nightmares"  . Constipation   . Depression   . Diabetes mellitus without complication (HCC)    Type II  . Dysrhythmia    Afib- had pacemaker, had it removed- takes Aspirin 325- daily  . Encounter for removal of cardiac resynchronization therapy pacemaker   . Grief 06/17/2015  . History of blood product transfusion   . Hypertension   . Hypothyroidism   . Infected pacemaker (HCC)   . Major depression 06/17/2015  . MSSA (methicillin susceptible Staphylococcus aureus) infection   . OAB (overactive bladder)   . Overactive bladder   . Pacemaker   . Renal disorder   . Septic hip (HCC)   . Septicemia (HCC)   . Severe depression (HCC) 12/07/2016  . Staphylococcus aureus infection   . Thoracic spine pain 11/28/2014    Patient Active Problem List   Diagnosis Date Noted  . Bilateral leg weakness 06/27/2017  .  Depression 12/07/2016  . Alzheimer's type dementia 12/07/2016  . Anxiety 01/11/2016  . Other chronic pain 06/17/2015  . Grief 06/17/2015  . Major depression 06/17/2015  . Type 2 diabetes mellitus with complication (HCC)   . Discitis 01/10/2015  . Osteomyelitis of thoracic spine (HCC) 01/10/2015  . Thoracic spine pain 11/28/2014  . Elevated PSA 11/08/2014  . Prosthetic hip infection (HCC) 02/16/2013  . MSSA (methicillin susceptible Staphylococcus aureus) infection   . Infected pacemaker (HCC)   . Closed dislocation of right hip (HCC) 10/02/2012    Class: Acute  . Retroperitoneal fluid collection 08/28/2012  . Staphylococcus aureus bacteremia 08/27/2012  . Hypokalemia 08/26/2012  . DM type 2 (diabetes mellitus, type 2) (HCC) 08/26/2012  . Thrombocytopenia (HCC) 03/21/2012  . Hypothyroidism 02/14/2012  . Hyperlipidemia LDL goal <100 02/14/2012  . Benign prostatic hyperplasia 02/14/2012  . Lower extremity weakness 02/11/2012  . Anemia in chronic kidney disease 02/11/2012  . Chronic kidney disease, stage III (moderate) (HCC) 02/11/2012  . Atrial fibrillation (HCC) 02/11/2012  . Spinal stenosis 02/11/2012  . Hip dislocation, right (HCC) 11/30/2010  . Essential hypertension 11/30/2010    Past Surgical History:  Procedure Laterality Date  . COLONOSCOPY W/ POLYPECTOMY     small  . EYE SURGERY     bilateral  . HIP CLOSED REDUCTION Right 10/02/2012   Procedure: CLOSED REDUCTION HIP;  Surgeon: Velna Ochs, MD;  Location: [facility] OR;  Service: Orthopedics;  Laterality: Right;  . ICD LEAD REMOVAL Left 08/31/2012   Procedure: ICD LEAD REMOVAL;  Surgeon: Marinus Maw, MD;  Location: [facility] OR;  Service: Cardiovascular;  Laterality: Left;  . INCISION AND DRAINAGE HIP Right 09/02/2012   Procedure: IRRIGATION AND DEBRIDEMENT RIGHT HIP;  Surgeon: Nestor Lewandowsky, MD;  Location: [facility] OR;  Service: Orthopedics;  Laterality: Right;  . INCISION AND DRAINAGE HIP Right 08/10/2014   Procedure: IRRIGATION AND  DEBRIDEMENT RIGHT HIP SUPERFICIAL VS DEEP;  Surgeon: Gean Birchwood, MD;  Location: [facility] OR;  Service: Orthopedics;  Laterality: Right;  . JOINT REPLACEMENT    . LUMBAR LAMINECTOMY/DECOMPRESSION MICRODISCECTOMY  02/14/2012   Procedure: LUMBAR LAMINECTOMY/DECOMPRESSION MICRODISCECTOMY 1 LEVEL;  Surgeon: Tia Alert, MD;  Location: [facility] NEURO ORS;  Service: Neurosurgery;  Laterality: N/A;  Thoracic twelve - Lumbar one decompressive laminectomy.  Marland Kitchen PACEMAKER INSERTION    . RADIOLOGY WITH ANESTHESIA N/A 01/10/2015   Procedure: MRI LUMBAR SPINE WITH/WITHOUT   (RADIOLOGY WITH ANESTHESIA);  Surgeon: Medication Radiologist, MD;  Location: [facility] OR;  Service: Radiology;  Laterality: N/A;  . TEE WITHOUT CARDIOVERSION N/A 08/30/2012   Procedure: TRANSESOPHAGEAL ECHOCARDIOGRAM (TEE);  Surgeon: Laurey Morale, MD;  Location: [facility] ENDOSCOPY;  Service: Cardiovascular;  Laterality: N/A;  . TONSILLECTOMY    . TOTAL HIP REVISION Right 09/02/2012   Procedure: REVISION OF POLYETHYLENE LINER AND FEMORAL HEAD ;  Surgeon: Nestor Lewandowsky, MD;  Location: [facility] OR;  Service: Orthopedics;  Laterality: Right;  . TOTAL HIP REVISION Right 11/28/2012   Procedure: TOTAL HIP REVISION With Placement of Contrain Liner;  Surgeon: Nestor Lewandowsky, MD;  Location: [facility] OR;  Service: Orthopedics;  Laterality: Right;        Home Medications    Prior to Admission medications   Medication Sig Start Date End Date Taking? Authorizing Provider  aspirin 325 MG EC tablet Take 325 mg by mouth daily.   Yes [provider]  cephALEXin (KEFLEX) 500 MG capsule Take 1,000 mg by mouth 2 (two) times daily.   Yes [provider]  diltiazem (CARDIZEM CD) 120 MG 24 hr capsule Take 120 mg by mouth daily. 07/09/14  Yes [provider]  docusate sodium (COLACE) 100 MG capsule Take 100 mg by mouth daily as needed for mild constipation.   Yes [provider]  escitalopram (LEXAPRO) 10 MG tablet Take 10 mg by mouth daily. **Take with 5 mg  tablet for total dosage of 15mg ** 10/17/17  Yes [provider]  escitalopram (LEXAPRO) 5 MG tablet Take 5 mg by mouth daily. **Take with 10mg  tablet for total dosage of 15mg ** 10/21/17  Yes [provider]  finasteride (PROSCAR) 5 MG tablet Take 5 mg by mouth daily.    Yes [provider]  lactulose (CHRONULAC) 10 GM/15ML solution Take 10-30 g by mouth See admin instructions. Take 45 mls by mouth every am and 15 mls every evening for constipation   Yes [provider]  levothyroxine (SYNTHROID, LEVOTHROID) 88 MCG tablet Take 88 mcg by mouth daily before breakfast.   Yes [provider]  Lidocaine (ASPERCREME LIDOCAINE) 4 % PTCH Apply 1 patch topically every 12 (twelve) hours. Apply one patch between shoulder and elbow on both sides   Yes [provider]  lovastatin (MEVACOR) 40 MG tablet Take 40 mg by mouth at bedtime.  07/09/14  Yes [provider]  magnesium hydroxide (MILK OF MAGNESIA) 400 MG/5ML suspension  Take 30 mLs by mouth 2 (two) times daily as needed for mild constipation.   Yes [provider]  pioglitazone (ACTOS) 15 MG tablet Take 15 mg by mouth daily.   Yes [provider]  potassium chloride SA (K-DUR,KLOR-CON) 20 MEQ tablet Take 10 mEq by mouth 2 (two) times daily.    Yes [provider]  cyclobenzaprine (FLEXERIL) 5 MG tablet Take 5 mg by mouth 3 (three) times daily as needed for muscle spasms.    [provider]  DULoxetine (CYMBALTA) 30 MG capsule Take 30 mg by mouth daily.    [provider]  naloxone [facility]) nasal spray 4 mg/0.1 mL Place 1 spray into the nose as needed.    [provider]  naproxen sodium (ALEVE) 220 MG tablet Take 220 mg by mouth daily.    [provider]  ONE TOUCH ULTRA TEST test strip  11/07/14   [provider]  telmisartan (MICARDIS) 40 MG tablet Take 40 mg by mouth daily.    [provider]  terazosin (HYTRIN) 5 MG  capsule Take 5 mg by mouth daily.  11/15/12   [provider]  torsemide (DEMADEX) 10 MG tablet Take 10 mg by mouth daily. With extra 10 mg po on Monday and Thursday    [provider]    Family History Family History  Problem Relation Age of Onset  . Heart failure Mother     Social History Social History   Tobacco Use  . Smoking status: Never Smoker  . Smokeless tobacco: Never Used  Substance Use Topics  . Alcohol use: No    Alcohol/week: 0.0 standard drinks  . Drug use: No     Allergies   Ace inhibitors; Gabapentin; Nsaids; and Zolpidem   Review of Systems Review of Systems  Constitutional: Negative for chills and fever.  HENT: Negative for congestion and facial swelling.   Eyes: Negative for discharge and visual disturbance.  Respiratory: Negative for shortness of breath.   Cardiovascular: Negative for chest pain and palpitations.  Gastrointestinal: Negative for abdominal pain, diarrhea and vomiting.  Musculoskeletal: Negative for arthralgias and myalgias.  Skin: Negative for color change and rash.  Neurological: Positive for weakness (generalized). Negative for tremors, syncope and headaches.  Psychiatric/Behavioral: Negative for confusion and dysphoric mood.     Physical Exam Updated Vital Signs BP 91/60   Pulse (!) 51   Temp 97.7 F (36.5 C) (Oral)   Resp 15   SpO2 100%   Physical Exam  Constitutional: He is oriented to person, place, and time. He appears well-developed and well-nourished.  Difficult to arouse and then is conversant.  HENT:  Head: Normocephalic and atraumatic.  Eyes: Pupils are equal, round, and reactive to light. EOM are normal.  Neck: Normal range of motion. Neck supple. No JVD present.  Cardiovascular: Normal rate and regular rhythm. Exam reveals no gallop and no friction rub.  No murmur heard. Pulmonary/Chest: No respiratory distress. He has no wheezes.  Abdominal: He exhibits no distension and no mass. There is  no tenderness. There is no rebound and no guarding.  Musculoskeletal: Normal range of motion.  Neurological: He is alert and oriented to person, place, and time.  Skin: No rash noted. No pallor.  Psychiatric: He has a normal mood and affect. His behavior is normal.  Nursing note and vitals reviewed.    ED Treatments / Results  Labs (all labs ordered are listed, but only abnormal results are displayed) Labs Reviewed  URINALYSIS,  ROUTINE W REFLEX MICROSCOPIC  CBC  COMPREHENSIVE METABOLIC PANEL  I-STAT VENOUS BLOOD GAS, ED    EKG EKG Interpretation  Date/Time:  Monday October 25 2017 14:25:31 EDT Ventricular Rate:  72 PR Interval:    QRS Duration: 114 QT Interval:  427 QTC Calculation: 468 R Axis:   -22 Text Interpretation:  Atrial fibrillation Borderline intraventricular conduction delay Low voltage, extremity and precordial leads No significant change since last tracing Confirmed by Melene Plan [phone]) on 10/25/2017 2:59:04 PM   Radiology Dg Chest Portable 1 View  Result Date: 10/25/2017 CLINICAL DATA:  Patient with weakness. EXAM: PORTABLE CHEST 1 VIEW COMPARISON:  Chest radiograph 08/12/2017 FINDINGS: Monitoring leads overlie the patient. Stable cardiomegaly. Aortic atherosclerosis. No consolidative pulmonary opacities. No pleural effusion or pneumothorax. Thoracic spine degenerative changes. IMPRESSION: No acute cardiopulmonary process.  Cardiomegaly. Electronically Signed   By: Annia Belt M.D.   On: 10/25/2017 15:01    Procedures Procedures (including critical care time)  Medications Ordered in ED Medications - No data to display   Initial Impression / Assessment and Plan / ED Course  I have reviewed the triage vital signs and the nursing notes.  Pertinent labs & imaging results that were available during my care of the patient were reviewed by me and considered in my medical decision making (see chart for details).     84 yo M with a cc of AMS.  Likely by  history and physical this is due to his new medication changes.  Will obtain CT labs chest x-ray and UA.  Turned over to Dr. Madilyn Hook, please see their note for further details of care.   The patients results and plan were reviewed and discussed.   Any x-rays performed were independently reviewed by myself.   Differential diagnosis were considered with the presenting HPI.  Medications - No data to display  Vitals:   10/25/17 1430 10/25/17 1436 10/25/17 1445 10/25/17 1515  BP: 105/73  107/71 91/60  Pulse: 69  69 (!) 51  Resp: 15  15 15   Temp:  97.7 F (36.5 C)    TempSrc:  Oral    SpO2: 100%  98% 100%    Final diagnoses:  Transient alteration of awareness      Final Clinical Impressions(s) / ED Diagnoses   Final diagnoses:  Transient alteration of awareness    ED Discharge Orders    None       Melene Plan, DO 10/25/17 1548

## 2017-10-25 NOTE — ED Triage Notes (Signed)
Pt here from [facility] with c.o weakness, pt recently started on lexapro  and xanax , pt is in afib but a controlled rate ,cbg 110

## 2017-10-25 NOTE — ED Notes (Signed)
Report called to [facility].   Willette Pa RN, received.

## 2017-10-25 NOTE — ED Provider Notes (Signed)
Patient care assumed at 1600.  Patient here with increased weakness after recently starting on lexapro and xanax. He is awake and alert and non-toxic appearing on examination. He has chronic bilateral lower extremity weakness. No concerning features for CVA. UA is not consistent with UTI. Discussed with patient and wife importance of discontinuing the Xanax as this is likely contributing to his symptoms. Plan to discharge to facility with outpatient follow-up and return precautions.   Tilden Fossa, MD 10/25/17 2340

## 2017-10-25 NOTE — ED Notes (Signed)
Patient verbalizes understanding of medications and discharge instructions. No further questions at this time. VSS and patient transported via [facility].

## 2017-10-25 NOTE — ED Notes (Signed)
[facility] called  

## 2017-10-25 NOTE — ED Notes (Signed)
Patient transported to CT 

## 2017-11-03 ENCOUNTER — Encounter (HOSPITAL_COMMUNITY): Payer: Self-pay | Admitting: Physician Assistant

## 2017-11-03 ENCOUNTER — Observation Stay (HOSPITAL_COMMUNITY)
Admission: EM | Admit: 2017-11-03 | Discharge: 2017-11-09 | Disposition: A | Discharge status: Skilled Nursing Facility | Payer: Medicare Other | Attending: Internal Medicine | Admitting: Internal Medicine

## 2017-11-03 ENCOUNTER — Other Ambulatory Visit: Payer: Self-pay

## 2017-11-03 ENCOUNTER — Emergency Department (HOSPITAL_COMMUNITY): Payer: Medicare Other

## 2017-11-03 DIAGNOSIS — G92 Toxic encephalopathy: Secondary | ICD-10-CM | POA: Diagnosis not present

## 2017-11-03 DIAGNOSIS — R2990 Unspecified symptoms and signs involving the nervous system: Secondary | ICD-10-CM

## 2017-11-03 DIAGNOSIS — N183 Chronic kidney disease, stage 3 (moderate): Secondary | ICD-10-CM | POA: Insufficient documentation

## 2017-11-03 DIAGNOSIS — G8929 Other chronic pain: Secondary | ICD-10-CM | POA: Insufficient documentation

## 2017-11-03 DIAGNOSIS — I10 Essential (primary) hypertension: Secondary | ICD-10-CM | POA: Diagnosis present

## 2017-11-03 DIAGNOSIS — G309 Alzheimer's disease, unspecified: Secondary | ICD-10-CM | POA: Insufficient documentation

## 2017-11-03 DIAGNOSIS — M1990 Unspecified osteoarthritis, unspecified site: Secondary | ICD-10-CM | POA: Diagnosis not present

## 2017-11-03 DIAGNOSIS — E1122 Type 2 diabetes mellitus with diabetic chronic kidney disease: Secondary | ICD-10-CM | POA: Diagnosis not present

## 2017-11-03 DIAGNOSIS — I129 Hypertensive chronic kidney disease with stage 1 through stage 4 chronic kidney disease, or unspecified chronic kidney disease: Secondary | ICD-10-CM | POA: Diagnosis not present

## 2017-11-03 DIAGNOSIS — Z993 Dependence on wheelchair: Secondary | ICD-10-CM | POA: Diagnosis not present

## 2017-11-03 DIAGNOSIS — F419 Anxiety disorder, unspecified: Secondary | ICD-10-CM | POA: Insufficient documentation

## 2017-11-03 DIAGNOSIS — Z23 Encounter for immunization: Secondary | ICD-10-CM | POA: Insufficient documentation

## 2017-11-03 DIAGNOSIS — R001 Bradycardia, unspecified: Secondary | ICD-10-CM | POA: Diagnosis present

## 2017-11-03 DIAGNOSIS — F0280 Dementia in other diseases classified elsewhere without behavioral disturbance: Secondary | ICD-10-CM | POA: Diagnosis not present

## 2017-11-03 DIAGNOSIS — R4182 Altered mental status, unspecified: Secondary | ICD-10-CM | POA: Insufficient documentation

## 2017-11-03 DIAGNOSIS — E119 Type 2 diabetes mellitus without complications: Secondary | ICD-10-CM

## 2017-11-03 DIAGNOSIS — R29810 Facial weakness: Secondary | ICD-10-CM | POA: Diagnosis present

## 2017-11-03 DIAGNOSIS — F329 Major depressive disorder, single episode, unspecified: Secondary | ICD-10-CM | POA: Insufficient documentation

## 2017-11-03 DIAGNOSIS — Z7901 Long term (current) use of anticoagulants: Secondary | ICD-10-CM | POA: Insufficient documentation

## 2017-11-03 DIAGNOSIS — I4821 Permanent atrial fibrillation: Secondary | ICD-10-CM

## 2017-11-03 DIAGNOSIS — R404 Transient alteration of awareness: Secondary | ICD-10-CM | POA: Diagnosis not present

## 2017-11-03 DIAGNOSIS — Z7982 Long term (current) use of aspirin: Secondary | ICD-10-CM | POA: Insufficient documentation

## 2017-11-03 DIAGNOSIS — Z86718 Personal history of other venous thrombosis and embolism: Secondary | ICD-10-CM | POA: Insufficient documentation

## 2017-11-03 DIAGNOSIS — I482 Chronic atrial fibrillation, unspecified: Secondary | ICD-10-CM | POA: Diagnosis not present

## 2017-11-03 DIAGNOSIS — E785 Hyperlipidemia, unspecified: Secondary | ICD-10-CM | POA: Diagnosis not present

## 2017-11-03 DIAGNOSIS — N3281 Overactive bladder: Secondary | ICD-10-CM | POA: Insufficient documentation

## 2017-11-03 DIAGNOSIS — G459 Transient cerebral ischemic attack, unspecified: Principal | ICD-10-CM | POA: Insufficient documentation

## 2017-11-03 DIAGNOSIS — E039 Hypothyroidism, unspecified: Secondary | ICD-10-CM | POA: Diagnosis not present

## 2017-11-03 DIAGNOSIS — R29898 Other symptoms and signs involving the musculoskeletal system: Secondary | ICD-10-CM | POA: Diagnosis present

## 2017-11-03 HISTORY — DX: Anemia, unspecified: D64.9

## 2017-11-03 HISTORY — DX: Chronic kidney disease, stage 3 unspecified: N18.30

## 2017-11-03 HISTORY — DX: Acute embolism and thrombosis of unspecified deep veins of unspecified lower extremity: I82.409

## 2017-11-03 HISTORY — DX: Chronic kidney disease, stage 3 (moderate): N18.3

## 2017-11-03 HISTORY — DX: Chronic atrial fibrillation, unspecified: I48.20

## 2017-11-03 HISTORY — DX: Bradycardia, unspecified: R00.1

## 2017-11-03 LAB — I-STAT CHEM 8, ED
BUN: 33 mg/dL — AB (ref 8–23)
BUN: 33 mg/dL — ABNORMAL HIGH (ref 8–23)
CALCIUM ION: 1.11 mmol/L — AB (ref 1.15–1.40)
CREATININE: 1.5 mg/dL — AB (ref 0.61–1.24)
Calcium, Ion: 1.12 mmol/L — ABNORMAL LOW (ref 1.15–1.40)
Chloride: 105 mmol/L (ref 98–111)
Chloride: 106 mmol/L (ref 98–111)
Creatinine, Ser: 1.5 mg/dL — ABNORMAL HIGH (ref 0.61–1.24)
Glucose, Bld: 111 mg/dL — ABNORMAL HIGH (ref 70–99)
Glucose, Bld: 110 mg/dL — ABNORMAL HIGH (ref 70–99)
HEMATOCRIT: 28 % — AB (ref 39.0–52.0)
HEMATOCRIT: 28 % — AB (ref 39.0–52.0)
Hemoglobin: 9.5 g/dL — ABNORMAL LOW (ref 13.0–17.0)
Hemoglobin: 9.5 g/dL — ABNORMAL LOW (ref 13.0–17.0)
Potassium: 4.5 mmol/L (ref 3.5–5.1)
Potassium: 4.5 mmol/L (ref 3.5–5.1)
SODIUM: 139 mmol/L (ref 135–145)
Sodium: 140 mmol/L (ref 135–145)
TCO2: 24 mmol/L (ref 22–32)
TCO2: 25 mmol/L (ref 22–32)

## 2017-11-03 LAB — DIFFERENTIAL
ABS IMMATURE GRANULOCYTES: 0.04 10*3/uL (ref 0.00–0.07)
BASOS PCT: 1 %
Basophils Absolute: 0.1 10*3/uL (ref 0.0–0.1)
EOS ABS: 0.2 10*3/uL (ref 0.0–0.5)
Eosinophils Relative: 4 %
IMMATURE GRANULOCYTES: 1 %
Lymphocytes Relative: 21 %
Lymphs Abs: 1.4 10*3/uL (ref 0.7–4.0)
Monocytes Absolute: 0.9 10*3/uL (ref 0.1–1.0)
Monocytes Relative: 13 %
NEUTROS PCT: 60 %
Neutro Abs: 4.2 10*3/uL (ref 1.7–7.7)

## 2017-11-03 LAB — CBC
HCT: 30.5 % — ABNORMAL LOW (ref 39.0–52.0)
Hemoglobin: 9.2 g/dL — ABNORMAL LOW (ref 13.0–17.0)
MCH: 30.1 pg (ref 26.0–34.0)
MCHC: 30.2 g/dL (ref 30.0–36.0)
MCV: 99.7 fL (ref 80.0–100.0)
PLATELETS: 187 10*3/uL (ref 150–400)
RBC: 3.06 MIL/uL — AB (ref 4.22–5.81)
RDW: 14.1 % (ref 11.5–15.5)
WBC: 6.8 10*3/uL (ref 4.0–10.5)
nRBC: 0 % (ref 0.0–0.2)

## 2017-11-03 LAB — COMPREHENSIVE METABOLIC PANEL
ALBUMIN: 3.2 g/dL — AB (ref 3.5–5.0)
ALT: 13 U/L (ref 0–44)
AST: 19 U/L (ref 15–41)
Alkaline Phosphatase: 82 U/L (ref 38–126)
Anion gap: 9 (ref 5–15)
BUN: 33 mg/dL — AB (ref 8–23)
CHLORIDE: 106 mmol/L (ref 98–111)
CO2: 24 mmol/L (ref 22–32)
Calcium: 9.1 mg/dL (ref 8.9–10.3)
Creatinine, Ser: 1.52 mg/dL — ABNORMAL HIGH (ref 0.61–1.24)
GFR calc Af Amer: 46 mL/min — ABNORMAL LOW (ref >60)
GFR calc non Af Amer: 40 mL/min — ABNORMAL LOW (ref >60)
GLUCOSE: 116 mg/dL — AB (ref 70–99)
POTASSIUM: 4.5 mmol/L (ref 3.5–5.1)
Sodium: 139 mmol/L (ref 135–145)
Total Bilirubin: 0.6 mg/dL (ref 0.3–1.2)
Total Protein: 6.2 g/dL — ABNORMAL LOW (ref 6.5–8.1)

## 2017-11-03 LAB — APTT: APTT: 47 s — AB (ref 24–36)

## 2017-11-03 LAB — I-STAT TROPONIN, ED: Troponin i, poc: 0.02 ng/mL (ref 0.00–0.08)

## 2017-11-03 LAB — PROTIME-INR
INR: 2.44
Prothrombin Time: 26.3 seconds — ABNORMAL HIGH (ref 11.4–15.2)

## 2017-11-03 LAB — TSH: TSH: 3.601 u[IU]/mL (ref 0.350–4.500)

## 2017-11-03 LAB — TROPONIN I: Troponin I: <0.03 ng/mL (ref <0.03)

## 2017-11-03 LAB — ETHANOL: Alcohol, Ethyl (B): <10 mg/dL (ref <10)

## 2017-11-03 MED ORDER — ACETAMINOPHEN 650 MG RE SUPP: 650.0000 mg | Freq: Four times a day (QID) | RECTAL | Status: DC | PRN

## 2017-11-03 MED ORDER — HYDROCODONE-ACETAMINOPHEN 5-325 MG PO TABS
1.0000 | ORAL_TABLET | ORAL | Status: DC | PRN
  Administered 2017-11-04: 1 via ORAL
  Filled 2017-11-03: qty 1

## 2017-11-03 MED ORDER — ACETAMINOPHEN 325 MG PO TABS
650.0000 mg | ORAL_TABLET | Freq: Four times a day (QID) | ORAL | Status: DC | PRN
  Administered 2017-11-05: 650 mg via ORAL
  Administered 2017-11-06: 325 mg via ORAL
  Administered 2017-11-07 – 2017-11-09 (×3): 650 mg via ORAL
  Filled 2017-11-03 (×5): qty 2

## 2017-11-03 MED ORDER — ATROPINE SULFATE 1 MG/10ML IJ SOSY
0.5000 mg | PREFILLED_SYRINGE | Freq: Once | INTRAMUSCULAR | Status: AC
  Administered 2017-11-03: 0.5 mg via INTRAVENOUS

## 2017-11-03 MED ORDER — INFLUENZA VAC SPLIT HIGH-DOSE 0.5 ML IM SUSY
0.5000 mL | PREFILLED_SYRINGE | INTRAMUSCULAR | Status: AC
  Administered 2017-11-04: 0.5 mL via INTRAMUSCULAR
  Filled 2017-11-03: qty 0.5

## 2017-11-03 MED ORDER — ONDANSETRON HCL 4 MG PO TABS: 4.0000 mg | ORAL_TABLET | Freq: Four times a day (QID) | ORAL | Status: DC | PRN

## 2017-11-03 MED ORDER — RIVAROXABAN 20 MG PO TABS
20.0000 mg | ORAL_TABLET | Freq: Every day | ORAL | Status: DC
  Administered 2017-11-04 – 2017-11-08 (×5): 20 mg via ORAL
  Filled 2017-11-03 (×5): qty 1

## 2017-11-03 MED ORDER — INSULIN ASPART 100 UNIT/ML ~~LOC~~ SOLN: 0.0000 [IU] | Freq: Three times a day (TID) | SUBCUTANEOUS | Status: DC

## 2017-11-03 MED ORDER — ONDANSETRON HCL 4 MG/2ML IJ SOLN
INTRAMUSCULAR | Status: AC
  Administered 2017-11-03: 18:00:00
  Filled 2017-11-03: qty 2

## 2017-11-03 MED ORDER — ONDANSETRON HCL 4 MG/2ML IJ SOLN: 4.0000 mg | Freq: Four times a day (QID) | INTRAMUSCULAR | Status: DC | PRN

## 2017-11-03 MED ORDER — LEVOTHYROXINE SODIUM 88 MCG PO TABS
88.0000 ug | ORAL_TABLET | Freq: Every day | ORAL | Status: DC
  Administered 2017-11-04 – 2017-11-09 (×6): 88 ug via ORAL
  Filled 2017-11-03 (×6): qty 1

## 2017-11-03 MED ORDER — DEXTROSE-NACL 5-0.45 % IV SOLN
INTRAVENOUS | Status: DC
  Administered 2017-11-03: 23:00:00 via INTRAVENOUS

## 2017-11-03 NOTE — ED Notes (Signed)
Cardiology at bedside.

## 2017-11-03 NOTE — ED Provider Notes (Signed)
[name] EMERGENCY DEPARTMENT Provider Note   CSN: [id number] Arrival date & time: 11/03/17  1714     History   Chief Complaint Chief Complaint  Patient presents with  . Code Stroke    HPI Braison Castrogiovanni is a 84 y.o. male.  HPI  Patient is a 84yo male with PMHx of HTN, Afib, DM, CKD3, DVT on Xarelto, and dementia who presents from ALF due to unresponsiveness at 1630 today.  Last seen normal at 1515 today.  He was found slumped over in his wheelchair, leaning to his left, and not responding.  Code Stroke was called because of left sided weakness and facial droop. En route he became bradycardic to 20s requiring transcutaneous pacing.  On arrival patient is AOx3 without weakness as he is able to raise both arms equally.  He c/o pain from transcutaneous pacing.  Per chart review patient recently started for weakness on 10/25/17 which was attributed to new medication initiation of benzodiazepine and Lexapro.  He was discharged home after negative work-up.  Of note patient with prior pacemaker placement that was removed 2/2 bacteremia.   Past Medical History:  Diagnosis Date  . Alzheimer's type dementia (HCC) 12/07/2016  . Anemia   . Anxiety   . Arthritis   . Bacteremia   . Bradycardia    a. PPM removed in 2014 due to bacteremia.  . Chronic atrial fibrillation   . Chronic right shoulder pain 06/17/2015  . CKD (chronic kidney disease), stage III (HCC)   . Complication of anesthesia    " Terrible Nightmares"  . Constipation   . Depression   . Diabetes mellitus without complication (HCC)    Type II  . DVT (deep venous thrombosis) (HCC)    a. in 2019 - pt reports behind knee - was on Xarelto 15mg  BID now 20mg  daily.  . Encounter for removal of cardiac resynchronization therapy pacemaker 2017  . Grief 06/17/2015  . History of blood product transfusion   . Hypertension   . Hypothyroidism   . Infected pacemaker (HCC)   . Major depression 06/17/2015  . MSSA  (methicillin susceptible Staphylococcus aureus) infection   . OAB (overactive bladder)   . Overactive bladder   . Septic hip (HCC)   . Septicemia (HCC)   . Severe depression (HCC) 12/07/2016  . Staphylococcus aureus infection   . Thoracic spine pain 11/28/2014    Patient Active Problem List   Diagnosis Date Noted  . Symptomatic bradycardia 11/03/2017  . Bradycardia   . Transient alteration of awareness   . Bilateral leg weakness 06/27/2017  . Depression 12/07/2016  . Alzheimer's type dementia (HCC) 12/07/2016  . Anxiety 01/11/2016  . Other chronic pain 06/17/2015  . Grief 06/17/2015  . Major depression 06/17/2015  . Type 2 diabetes mellitus with complication (HCC)   . Discitis 01/10/2015  . Osteomyelitis of thoracic spine (HCC) 01/10/2015  . Thoracic spine pain 11/28/2014  . Elevated PSA 11/08/2014  . Prosthetic hip infection (HCC) 02/16/2013  . MSSA (methicillin susceptible Staphylococcus aureus) infection   . Infected pacemaker (HCC)   . Closed dislocation of right hip (HCC) 10/02/2012    Class: Acute  . Retroperitoneal fluid collection 08/28/2012  . Staphylococcus aureus bacteremia 08/27/2012  . Hypokalemia 08/26/2012  . DM type 2 (diabetes mellitus, type 2) (HCC) 08/26/2012  . Thrombocytopenia (HCC) 03/21/2012  . Hypothyroidism 02/14/2012  . Hyperlipidemia LDL goal <100 02/14/2012  . Benign prostatic hyperplasia 02/14/2012  . Lower extremity weakness 02/11/2012  .  Anemia in chronic kidney disease 02/11/2012  . Chronic kidney disease, stage III (moderate) (HCC) 02/11/2012  . Atrial fibrillation (HCC) 02/11/2012  . Spinal stenosis 02/11/2012  . Hip dislocation, right (HCC) 11/30/2010  . Essential hypertension 11/30/2010    Past Surgical History:  Procedure Laterality Date  . COLONOSCOPY W/ POLYPECTOMY     small  . EYE SURGERY     bilateral  . HIP CLOSED REDUCTION Right 10/02/2012   Procedure: CLOSED REDUCTION HIP;  Surgeon: Velna Ochs, MD;  Location: [facility]  OR;  Service: Orthopedics;  Laterality: Right;  . ICD LEAD REMOVAL Left 08/31/2012   Procedure: ICD LEAD REMOVAL;  Surgeon: Marinus Maw, MD;  Location: [facility] OR;  Service: Cardiovascular;  Laterality: Left;  . INCISION AND DRAINAGE HIP Right 09/02/2012   Procedure: IRRIGATION AND DEBRIDEMENT RIGHT HIP;  Surgeon: Nestor Lewandowsky, MD;  Location: [facility] OR;  Service: Orthopedics;  Laterality: Right;  . INCISION AND DRAINAGE HIP Right 08/10/2014   Procedure: IRRIGATION AND DEBRIDEMENT RIGHT HIP SUPERFICIAL VS DEEP;  Surgeon: Gean Birchwood, MD;  Location: [facility] OR;  Service: Orthopedics;  Laterality: Right;  . INSERT / REPLACE / REMOVE PACEMAKER     removed in 2017 d/t sepsis/notes 11/03/2017  . JOINT REPLACEMENT    . LUMBAR LAMINECTOMY/DECOMPRESSION MICRODISCECTOMY  02/14/2012   Procedure: LUMBAR LAMINECTOMY/DECOMPRESSION MICRODISCECTOMY 1 LEVEL;  Surgeon: Tia Alert, MD;  Location: [facility] NEURO ORS;  Service: Neurosurgery;  Laterality: N/A;  Thoracic twelve - Lumbar one decompressive laminectomy.  Marland Kitchen RADIOLOGY WITH ANESTHESIA N/A 01/10/2015   Procedure: MRI LUMBAR SPINE WITH/WITHOUT   (RADIOLOGY WITH ANESTHESIA);  Surgeon: Medication Radiologist, MD;  Location: [facility] OR;  Service: Radiology;  Laterality: N/A;  . TEE WITHOUT CARDIOVERSION N/A 08/30/2012   Procedure: TRANSESOPHAGEAL ECHOCARDIOGRAM (TEE);  Surgeon: Laurey Morale, MD;  Location: [facility] ENDOSCOPY;  Service: Cardiovascular;  Laterality: N/A;  . TONSILLECTOMY    . TOTAL HIP REVISION Right 09/02/2012   Procedure: REVISION OF POLYETHYLENE LINER AND FEMORAL HEAD ;  Surgeon: Nestor Lewandowsky, MD;  Location: [facility] OR;  Service: Orthopedics;  Laterality: Right;  . TOTAL HIP REVISION Right 11/28/2012   Procedure: TOTAL HIP REVISION With Placement of Contrain Liner;  Surgeon: Nestor Lewandowsky, MD;  Location: [facility] OR;  Service: Orthopedics;  Laterality: Right;        Home Medications    Prior to Admission medications   Medication Sig Start Date End Date Taking? Authorizing  Provider  ALPRAZolam (XANAX) 0.25 MG tablet Take 0.25 mg by mouth 2 (two) times daily as needed for anxiety (do not give if Drowsiness is present).   Yes [provider]  aspirin 325 MG EC tablet Take 325 mg by mouth daily.   Yes [provider]  cephALEXin (KEFLEX) 500 MG capsule Take 1,000 mg by mouth 2 (two) times daily.   Yes [provider]  cyclobenzaprine (FLEXERIL) 5 MG tablet Take 10 mg by mouth 2 (two) times daily.    Yes [provider]  diltiazem (CARDIZEM CD) 120 MG 24 hr capsule Take 120 mg by mouth daily. 07/09/14  Yes [provider]  docusate sodium (COLACE) 100 MG capsule Take 100 mg by mouth 2 (two) times daily.    Yes [provider]  escitalopram (LEXAPRO) 10 MG tablet Take 15 mg by mouth daily.  10/17/17  Yes [provider]  ferrous sulfate 325 (65 FE) MG tablet Take 325 mg by mouth daily with breakfast.   Yes [provider]  finasteride (PROSCAR) 5 MG tablet Take 5 mg by mouth at bedtime.    Yes [provider]  HYDROcodone-acetaminophen (NORCO) 10-325 MG tablet Take 1 tablet by mouth 2 (two) times daily.  05/09/15  Yes [provider]  lactulose (CHRONULAC) 10 GM/15ML solution Take 10-30 g by mouth See admin instructions. Take 45 mls by mouth every am and 15 mls every evening for constipation Hold for loose stool   Yes [provider]  levothyroxine (SYNTHROID, LEVOTHROID) 88 MCG tablet Take 88 mcg by mouth daily before breakfast.   Yes [provider]  Lidocaine (ASPERCREME LIDOCAINE) 4 % PTCH Apply 1 patch topically every 12 (twelve) hours. Apply one patch between shoulder and elbow on both sides each day for 12 hrs AM and PM off   Yes [provider]  lovastatin (MEVACOR) 40 MG tablet Take 40 mg by mouth at bedtime.  07/09/14  Yes [provider]  magnesium hydroxide (MILK OF MAGNESIA) 400 MG/5ML suspension Take 30 mLs by mouth 2 (two) times daily as  needed for mild constipation.   Yes [provider]  metoprolol tartrate (LOPRESSOR) 25 MG tablet Take 12.5 mg by mouth daily. 08/14/17  Yes [provider]  pioglitazone (ACTOS) 15 MG tablet Take 15 mg by mouth daily.   Yes [provider]  potassium chloride SA (K-DUR,KLOR-CON) 20 MEQ tablet Take 10 mEq by mouth See admin instructions. TAKE 10 mg BY MOUTH ONLY MON, TUES, THURS AND FRIDAY   Yes [provider]  rOPINIRole (REQUIP) 0.5 MG tablet Take 0.5 mg by mouth at bedtime.  10/18/17  Yes [provider]  telmisartan (MICARDIS) 40 MG tablet Take 40 mg by mouth daily.   Yes [provider]  terazosin (HYTRIN) 5 MG capsule Take 5 mg by mouth at bedtime.  11/15/12  Yes [provider]  torsemide (DEMADEX) 10 MG tablet Take 10 mg by mouth daily.    Yes [provider]  XARELTO 20 MG TABS tablet Take 20 mg by mouth daily.  10/18/17  Yes [provider]  ONE TOUCH ULTRA TEST test strip  11/07/14   [provider]    Family History Family History  Problem Relation Age of Onset  . Heart failure Mother     Social History Social History   Tobacco Use  . Smoking status: Never Smoker  . Smokeless tobacco: Never Used  Substance Use Topics  . Alcohol use: No    Alcohol/week: 0.0 standard drinks  . Drug use: No     Allergies   Ace inhibitors; Gabapentin; Nsaids; and Zolpidem   Review of Systems Review of Systems  Unable to perform ROS: Acuity of condition     Physical Exam Updated Vital Signs BP 117/67 (BP Location: Right Arm)   Pulse 77   Temp 97.9 F (36.6 C) (Oral)   Resp 16   Ht 5' 11.5" (1.816 m)   Wt 110.6 kg   SpO2 100%   BMI 33.53 kg/m   Physical Exam  Constitutional: He is oriented to person, place, and time. He appears well-developed.  Elderly appearing male.  HENT:  Head: Normocephalic and atraumatic.  Mouth/Throat: Oropharynx is clear and moist.  Eyes: Pupils are equal,  round, and reactive to light. EOM are normal.  Neck: Normal range of motion. Neck supple.  Cardiovascular: Intact distal pulses. An irregularly irregular rhythm present. Bradycardia present.  Transcutaneous pacing.  Pulmonary/Chest: Effort normal and breath sounds normal. No respiratory distress.  Abdominal: Soft.  He exhibits no distension. There is no tenderness. There is no guarding.  Musculoskeletal: He exhibits no edema.  Neurological: He is alert and oriented to person, place, and time. No cranial nerve deficit. GCS eye subscore is 4. GCS verbal subscore is 5. GCS motor subscore is 6.  Able to hold both arms up for 10 seconds.  Decreased weakness of BLE, equal.  Skin: Skin is warm and dry. Capillary refill takes less than 2 seconds.  Psychiatric: He has a normal mood and affect.  Nursing note and vitals reviewed.    ED Treatments / Results  Labs (all labs ordered are listed, but only abnormal results are displayed) Labs Reviewed  PROTIME-INR - Abnormal; Notable for the following components:      Result Value   Prothrombin Time 26.3 (*)    All other components within normal limits  APTT - Abnormal; Notable for the following components:   aPTT 47 (*)    All other components within normal limits  CBC - Abnormal; Notable for the following components:   RBC 3.06 (*)    Hemoglobin 9.2 (*)    HCT 30.5 (*)    All other components within normal limits  COMPREHENSIVE METABOLIC PANEL - Abnormal; Notable for the following components:   Glucose, Bld 116 (*)    BUN 33 (*)    Creatinine, Ser 1.52 (*)    Total Protein 6.2 (*)    Albumin 3.2 (*)    GFR calc non Af Amer 40 (*)    GFR calc Af Amer 46 (*)    All other components within normal limits  RAPID URINE DRUG SCREEN, HOSP PERFORMED - Abnormal; Notable for the following components:   Opiates POSITIVE (*)    All other components within normal limits  URINALYSIS, ROUTINE W REFLEX MICROSCOPIC - Abnormal; Notable for the following  components:   APPearance HAZY (*)    Hgb urine dipstick SMALL (*)    Leukocytes, UA MODERATE (*)    Bacteria, UA RARE (*)    All other components within normal limits  BASIC METABOLIC PANEL - Abnormal; Notable for the following components:   BUN 31 (*)    Creatinine, Ser 1.40 (*)    Calcium 8.6 (*)    GFR calc non Af Amer 44 (*)    GFR calc Af Amer 51 (*)    All other components within normal limits  LIPID PANEL - Abnormal; Notable for the following components:   HDL 33 (*)    All other components within normal limits  I-STAT CHEM 8, ED - Abnormal; Notable for the following components:   BUN 33 (*)    Creatinine, Ser 1.50 (*)    Glucose, Bld 111 (*)    Calcium, Ion 1.12 (*)    Hemoglobin 9.5 (*)    HCT 28.0 (*)    All other components within normal limits  I-STAT CHEM 8, ED - Abnormal; Notable for the following components:   BUN 33 (*)    Creatinine, Ser 1.50 (*)    Glucose, Bld 110 (*)    Calcium, Ion 1.11 (*)    Hemoglobin 9.5 (*)    HCT 28.0 (*)    All other components within normal limits  MRSA PCR SCREENING  ETHANOL  DIFFERENTIAL  TSH  TROPONIN I  TROPONIN I  TROPONIN I  HEMOGLOBIN A1C  GLUCOSE, CAPILLARY  I-STAT TROPONIN, ED    EKG None  Radiology Ct Head Code Stroke Wo Contrast  Result Date: 11/03/2017 CLINICAL  DATA:  Code stroke. 84 year old male with left facial droop and arm weakness. Last known normal 1515 hours. EXAM: CT HEAD WITHOUT CONTRAST TECHNIQUE: Contiguous axial images were obtained from the base of the skull through the vertex without intravenous contrast. COMPARISON:  CT head 10/25/2017. FINDINGS: Brain: Stable cerebral volume with mild-to-moderate ventriculomegaly. No transependymal edema suspected. Patchy white matter hypodensity at the right posterior limb internal capsule and lentiform is stable. No acute intracranial hemorrhage, mass effect, or acute cortically based infarct. Vascular: Calcified atherosclerosis at the skull base. Trace gas  in the right cavernous sinus. Chronic calcified right MCA atherosclerosis. No suspicious intracranial vascular hyperdensity. Skull: No acute osseous abnormality identified. Advanced degenerative changes in the visible upper cervical spine. Sinuses/Orbits: Visualized paranasal sinuses and mastoids are stable and well pneumatized. Other: Stable orbits soft tissues. Nonspecific soft tissue gas at the right forehead and right temporal region. Additionally, there is scattered right masticator space gas which appears to be intravenous (series 4, image 22). No definite acute soft tissue injury. ASPECTS (Alberta Stroke Program Early CT Score) - Ganglionic level infarction (caudate, lentiform nuclei, internal capsule, insula, M1-M3 cortex): 7 - Supraganglionic infarction (M4-M6 cortex): 3 Total score (0-10 with 10 being normal): 10 IMPRESSION: 1. Stable CT appearance of the brain with no acute cortically based infarct or intracranial hemorrhage identified. ASPECTS is 10. 2. Gas in the right face appears to be within venous structures, and there is trace gas in the right cavernous sinus. This can be seen as a sequelae of recent intravenous access. 3. These results were communicated to Dr. Wilford Corner at 5:39 pmon 10/9/2019by text page via the [facility] messaging system. Electronically Signed   By: Odessa Fleming M.D.   On: 11/03/2017 17:39    Procedures Procedures (including critical care time)  Medications Ordered in ED Medications  levothyroxine (SYNTHROID, LEVOTHROID) tablet 88 mcg (88 mcg Oral Given 11/04/17 0618)  dextrose 5 %-0.45 % sodium chloride infusion ( Intravenous Rate/Dose Verify 11/04/17 0400)  ondansetron (ZOFRAN) tablet 4 mg (has no administration in time range)    Or  ondansetron (ZOFRAN) injection 4 mg (has no administration in time range)  acetaminophen (TYLENOL) tablet 650 mg (has no administration in time range)    Or  acetaminophen (TYLENOL) suppository 650 mg (has no administration in time range)    HYDROcodone-acetaminophen (NORCO/VICODIN) 5-325 MG per tablet 1-2 tablet (1 tablet Oral Given 11/04/17 0231)  rivaroxaban (XARELTO) tablet 20 mg (has no administration in time range)  insulin aspart (novoLOG) injection 0-9 Units (0 Units Subcutaneous Not Given 11/04/17 0619)  Influenza vac split quadrivalent PF (FLUZONE HIGH-DOSE) injection 0.5 mL (has no administration in time range)  ondansetron (ZOFRAN) 4 MG/2ML injection (  Given 11/03/17 1735)  atropine 1 MG/10ML injection 0.5 mg (0.5 mg Intravenous Given 11/03/17 1737)     Initial Impression / Assessment and Plan / ED Course  I have reviewed the triage vital signs and the nursing notes.  Pertinent labs & imaging results that were available during my care of the patient were reviewed by me and considered in my medical decision making (see chart for details).     Patient is a 84yo male with PMHx of HTN, Afib, DM, CKD3, DVT on Xarelto, and dementia who presents from ALF due to unresponsiveness at 1630 today.  He became bradycardic en route with EMS and required transcutaneous pacing.  On arrival he was AOx3 and assessed by neurology.  He was transported to the CT scanner which revealed no acute intracranial abnormalities.  He was given atropine and cardiology consulted given concern for complete heart block.  Neurology recommending further work-up for stroke like symptoms vs hypoperfusion syndrome 2/2 hypoperfusion with bradycardia.  Will continue to follow.  Cardiology states he is currently rate controlled and not requiring pacing post atropine.  He recommends holding BB and CCB.  Patient may require temporary transvenous pacemaker if he has further symptomatic bradycardia.  Labs obtained and at baseline.  Discussed case with hospitalist who will admit for further evaluation and monitoring.  The plan for this patient was discussed with Dr. Adela Lank who voiced agreement and who oversaw evaluation and treatment of this patient.  Final Clinical  Impressions(s) / ED Diagnoses   Final diagnoses:  Bradycardia  Altered mental status, unspecified altered mental status type    ED Discharge Orders    None       Abelardo Diesel, MD 11/04/17 1110    Melene Plan, DO 11/04/17 1502    Melene Plan, DO 11/04/17 1503

## 2017-11-03 NOTE — ED Triage Notes (Signed)
Pt presents as a Code stroke with LSN at 1515, found at 1630.  EMS states left sided facial droop, left sided weakness.  Altered, not following commands, found slumped over by staff.  EMs also noted patient's heart rate to drop to 20's en route, began pacing.   Pt following commands on arrival, but slow to respond, speech slurred.   Given 300NaCl en route, hypotensive.   LVO = 4 for EMS.

## 2017-11-03 NOTE — Consult Note (Addendum)
Neurology Consultation  Reason for Consult: Code stroke Referring Physician: Emergency MD  CC: Left-sided weakness left facial droop  History is obtained from: EMS  HPI: [name] is a 84 y.o. male with past medical historhypertension, atrial fibrillation on Eliquis last dose ~1130 hrs today, diabetes, CKD, major depression,   resident of an assisted living facility, wheelchair-bound at baseline-modified Rankin 4-5,last seen at 1515 hrs. and then found that 1630 hrs. to be not as responsive.  EMS was called.  Some left-sided facial droop and left arm weakness was appreciated by EMS and a code stroke was called on route to the hospital.  While in route patient patient's heart rate down to the 20s which required EMS to start pacing him the EMS truck.   At the bridge patient was able to state his name and follow commands such as raising his arms.  Of note patient was recently seen in the hospital last week on 10/25/2017 for chief complaint of weakness.  Per that note he recently started a new benzodiazepine as well as Lexapro.  Patient was sleepy on arrival at that time.  At that time his weakness was thought to be overmedication, medications were optimized and he was discharged home.  ED course - patient continued to be paced while he was brought to CT.  No evidence of hemorrhage/intracranial bleed, no evidence of evolving stroke.  LKW: 1315 on 11/03/2017 tpa given?: no, on Eliquis-last dose taken a few hours prior to presentation Premorbid modified Rankin scale (mRS): 4-5 NIH stroke scale 1a Level of Conscious.: 0 1b LOC Questions: 0 1c LOC Commands: 0 2 Best Gaze: 0 3 Visual: 0 4 Facial Palsy: 1 5a Motor Arm - left: 0 5b Motor Arm - Right: 0 6a Motor Leg - Left: 3 6b Motor Leg - Right: 3 7 Limb Ataxia: 0 8 Sensory: 0 9 Best Language: 0 10 Dysarthria: 1 11 Extinct. and Inatten.: 0 TOTAL: 8  ROS: Difficult to ascertain as he was being paced while being imaged and examined by  multiple specialties.  No family available at the bedside to provide further history.  Past Medical History:  Diagnosis Date  . Alzheimer's type dementia 12/07/2016  . Anxiety   . Arthritis   . Atrial fibrillation (HCC)   . Atrial fibrillation (HCC)   . Bacteremia   . Chronic right shoulder pain 06/17/2015  . CKD (chronic kidney disease)   . Complication of anesthesia    " Terrible Nightmares"  . Constipation   . Depression   . Diabetes mellitus without complication (HCC)    Type II  . Dysrhythmia    Afib- had pacemaker, had it removed- takes Aspirin 325- daily  . Encounter for removal of cardiac resynchronization therapy pacemaker   . Grief 06/17/2015  . History of blood product transfusion   . Hypertension   . Hypothyroidism   . Infected pacemaker (HCC)   . Major depression 06/17/2015  . MSSA (methicillin susceptible Staphylococcus aureus) infection   . OAB (overactive bladder)   . Overactive bladder   . Pacemaker   . Renal disorder   . Septic hip (HCC)   . Septicemia (HCC)   . Severe depression (HCC) 12/07/2016  . Staphylococcus aureus infection   . Thoracic spine pain 11/28/2014   Family History  Problem Relation Age of Onset  . Heart failure Mother    Social History:   reports that he has never smoked. He has never used smokeless tobacco. He reports that he does not  drink alcohol or use drugs.  Medications No current facility-administered medications for this encounter.   Current Outpatient Medications:  .  aspirin 325 MG EC tablet, Take 325 mg by mouth daily., Disp: , Rfl:  .  cephALEXin (KEFLEX) 500 MG capsule, Take 1,000 mg by mouth 2 (two) times daily., Disp: , Rfl:  .  cyclobenzaprine (FLEXERIL) 5 MG tablet, Take 10 mg by mouth 2 (two) times daily. , Disp: , Rfl:  .  diltiazem (CARDIZEM CD) 120 MG 24 hr capsule, Take 120 mg by mouth daily., Disp: , Rfl:  .  docusate sodium (COLACE) 100 MG capsule, Take 100 mg by mouth 2 (two) times daily. , Disp: , Rfl:  .   escitalopram (LEXAPRO) 10 MG tablet, Take 10 mg by mouth daily. **Take with 5 mg tablet for total dosage of 15mg **, Disp: , Rfl:  .  escitalopram (LEXAPRO) 5 MG tablet, Take 5 mg by mouth daily. **Take with 10mg  tablet for total dosage of 15mg **, Disp: , Rfl:  .  ferrous sulfate 325 (65 FE) MG tablet, Take 325 mg by mouth daily with breakfast., Disp: , Rfl:  .  finasteride (PROSCAR) 5 MG tablet, Take 5 mg by mouth daily. , Disp: , Rfl:  .  HYDROcodone-acetaminophen (NORCO/VICODIN) 5-325 MG tablet, Take 1 tablet by mouth 2 (two) times daily as needed for pain., Disp: , Rfl:  .  lactulose (CHRONULAC) 10 GM/15ML solution, Take 10-30 g by mouth See admin instructions. Take 45 mls by mouth every am and 15 mls every evening for constipation, Disp: , Rfl:  .  levothyroxine (SYNTHROID, LEVOTHROID) 88 MCG tablet, Take 88 mcg by mouth daily before breakfast., Disp: , Rfl:  .  Lidocaine (ASPERCREME LIDOCAINE) 4 % PTCH, Apply 1 patch topically every 12 (twelve) hours. Apply one patch between shoulder and elbow on both sides, Disp: , Rfl:  .  lovastatin (MEVACOR) 40 MG tablet, Take 40 mg by mouth at bedtime. , Disp: , Rfl:  .  magnesium hydroxide (MILK OF MAGNESIA) 400 MG/5ML suspension, Take 30 mLs by mouth 2 (two) times daily as needed for mild constipation., Disp: , Rfl:  .  metoprolol tartrate (LOPRESSOR) 25 MG tablet, Take 12.5 mg by mouth daily., Disp: , Rfl:  .  ONE TOUCH ULTRA TEST test strip, , Disp: , Rfl:  .  pioglitazone (ACTOS) 15 MG tablet, Take 15 mg by mouth daily., Disp: , Rfl:  .  potassium chloride SA (K-DUR,KLOR-CON) 20 MEQ tablet, Take 10 mEq by mouth See admin instructions. TAKE HALF TABLET BY MOUTH ONLY MON, TUES, THURS AND FRIDAY, Disp: , Rfl:  .  rOPINIRole (REQUIP) 0.5 MG tablet, Take 0.5 mg by mouth at bedtime as needed for muscle spasms., Disp: , Rfl:  .  telmisartan (MICARDIS) 40 MG tablet, Take 40 mg by mouth daily., Disp: , Rfl:  .  terazosin (HYTRIN) 5 MG capsule, Take 5 mg by  mouth daily. , Disp: , Rfl:  .  torsemide (DEMADEX) 10 MG tablet, Take 10 mg by mouth daily. , Disp: , Rfl:  .  XARELTO 15 MG TABS tablet, Take 15 mg by mouth daily., Disp: , Rfl:   Exam: Current vital signs: BP 136/78   Pulse (!) 53   Resp 20   SpO2 100%  Vital signs in last 24 hours: Pulse Rate:  [53-64] 53 (10/09 1748) Resp:  [15-20] 20 (10/09 1748) BP: (104-151)/(58-78) 136/78 (10/09 1748) SpO2:  [100 %] 100 % (10/09 1748)  Physical Exam  Constitutional: Appears  well-developed and well-nourished.  Psych: Affect appropriate to situation Eyes: No scleral injection HENT: No OP obstrucion Head: Normocephalic.  Cardiovascular: Currently paced secondary to bradycardia in route Respiratory: Effort normal, non-labored breathing GI: Soft.  No distension. There is no tenderness.  Skin: WDI  Neuro: Mental Status: Patient is awake, alert, able to state his name and follow commands at the bridge Multitude of events happening, unable to provide a coherent history but reports more pain on right and left arms and no focal weakness.  Is unable to provide a clear history although. No signs of aphasia or neglect Cranial Nerves:--Pulls equal round reactive light, extra ocular movements intact, questionable left lower facial weakness but it goes away when he smiles, tongue midline, palate midline.  Motor: Able to hold both arms antigravity for greater than 10 seconds however he did appear in significant pain doing this especially in the shoulders patient was wiggling his toes however he had difficulty with leg raising his legs.  Would only wiggle his toes on both sides Sensory: Questionable decreased sensation on the left Unable to ascertain cerebellar function reliably as he was being constantly paced and jerking with the pacer pads.  Labs I have reviewed labs in epic and the results pertinent to this consultation are: CBC    Component Value Date/Time   WBC 6.8 11/03/2017 1715   RBC  3.06 (L) 11/03/2017 1715   HGB 9.5 (L) 11/03/2017 1738   HGB 11.2 (L) 03/21/2012 1426   HCT 28.0 (L) 11/03/2017 1738   HCT 32.8 (L) 03/21/2012 1426   PLT 187 11/03/2017 1715   PLT 167 03/21/2012 1426   MCV 99.7 11/03/2017 1715   MCV 94.9 03/21/2012 1426   MCH 30.1 11/03/2017 1715   MCHC 30.2 11/03/2017 1715   RDW 14.1 11/03/2017 1715   RDW 13.8 03/21/2012 1426   LYMPHSABS 1.4 11/03/2017 1715   LYMPHSABS 1.3 03/21/2012 1426   MONOABS 0.9 11/03/2017 1715   MONOABS 0.7 03/21/2012 1426   EOSABS 0.2 11/03/2017 1715   EOSABS 0.1 03/21/2012 1426   BASOSABS 0.1 11/03/2017 1715   BASOSABS 0.1 03/21/2012 1426   CMP     Component Value Date/Time   NA 139 11/03/2017 1738   NA 144 06/26/2017   NA 143 03/21/2012 1426   K 4.5 11/03/2017 1738   K 4.0 03/21/2012 1426   CL 106 11/03/2017 1738   CL 101 03/21/2012 1426   CO2 26 10/25/2017 1442   CO2 31 (H) 03/21/2012 1426   GLUCOSE 110 (H) 11/03/2017 1738   GLUCOSE 123 (H) 03/21/2012 1426   BUN 33 (H) 11/03/2017 1738   BUN 20 06/26/2017   BUN 43.7 Repeated and Verified (H) 03/21/2012 1426   CREATININE 1.50 (H) 11/03/2017 1738   CREATININE 1.47 (H) 02/24/2017 1554   CREATININE 1.9 Repeated and Verified (H) 03/21/2012 1426   CALCIUM 8.9 10/25/2017 1442   CALCIUM 9.3 03/21/2012 1426   PROT 5.9 (L) 10/25/2017 1442   PROT 6.9 03/21/2012 1426   ALBUMIN 3.1 (L) 10/25/2017 1442   ALBUMIN 3.2 (L) 03/21/2012 1426   AST 20 10/25/2017 1442   AST 18 03/21/2012 1426   ALT 14 10/25/2017 1442   ALT 15 03/21/2012 1426   ALKPHOS 79 10/25/2017 1442   ALKPHOS 74 03/21/2012 1426   BILITOT 0.7 10/25/2017 1442   BILITOT 0.42 03/21/2012 1426   GFRNONAA 45 (L) 10/25/2017 1442   GFRNONAA 43 (L) 02/24/2017 1554   GFRAA 53 (L) 10/25/2017 1442   GFRAA 50 (L)  02/24/2017 1554  Imaging I have reviewed the images obtained: CT-scan of the brain--stable CT appearance of the brain with no acute cortically based infarct or intracranial hemorrhage identified.   Aspects is 10.  Joshua Morn PA-C [facility] [phone]  M-F  (9:00 am- 5:00 PM)  11/03/2017, 5:42 PM   Attending addendum Patient seen and examined. Imaging reviewed by me personally.  CT scan of the brain showed no bleed or acute evolving ischemic stroke.  Assessment: 84 year old male presenting to the emergency department initially as a code stroke secondary to left facial droop and left arm weakness however in route patient bradycardia down to the point heart rate within 20s.  Patient required pacer to bring heart rate up to the 60s.   CT head did not show any acute bleed or evolving infarct. Currently being evaluated by cardiology and being externally paced. Not a candidate for IV TPA due to Xarelto being taken few hours prior to presentation. Not a candidate for endovascular thrombectomy due to poor modified Rankin score at baseline   At this time it is unclear if his altered mental status was secondary to his bradycardia and cardiac issues while at the nursing home and/or medications as he is on multiple sedating medications.  He does have atrial fibrillation, and could have had an acute stroke.  An MRI of the brain would be helpful to ascertain that.  Impression: -Strokelike symptoms-stroke/TIA versus hypoperfusion syndrome e.g.  possible watershed strokes secondary to bradycardia and hypoperfusion - Symptomatic bradycardia -Evaluate for toxic metabolic encephalopathy  Recommendations:- # MRI of the brain without contrast when stable #MRA Head and neck  #Transthoracic Echo,   #Continue Eliquis  #Start or continue Atorvastatin 80 mg/other high intensity statin # BP goal: permissive HTN upto 220/120 mmHg # HBA1C and Lipid profile # Telemetry monitoring # Frequent neuro checks # NPO until passes stroke swallow screen #Check urinalysis, chest x-ray #Cardiology consult--patient has a history of pacemaker implantation however looking at his last chest x-ray he  does not have a pacemaker at this time.  # please page stroke NP  Or  PA  Or MD from 8am -4 pm  as this patient from this time will be  followed by the stroke.   You can look them up on [email or web address]  Password [email or web address]  -- Milon Dikes, MD [facility] Pager: [phone] If 7pm to 7am, please call on call as listed on AMION.  CRITICAL CARE ATTESTATION This patient is critically ill and at significant risk of neurological worsening, death and care requires constant monitoring of vital signs, hemodynamics, respiratory, and cardiac monitoring. I spent 50  minutes of neurocritical care time performing neurological assessment, discussion with family, other specialists and medical decision making of high complexity in the care of  this patient.

## 2017-11-03 NOTE — ED Notes (Signed)
EDP, Resident, and stroke team all accompanied patient on pacer pads from EMS to CT scanner

## 2017-11-03 NOTE — Consult Note (Addendum)
Cardiology Consultation:   Patient ID: [name] MRN: [id number]; DOB: Feb 18, 1930  Admit date: 11/03/2017 Date of Consult: 11/03/2017  Primary Care Provider: Malka So., MD Primary Cardiologist: No primary care provider on file.  Primary Electrophysiologist:  None    Patient Profile:   [name] is a 84 y.o. male with a hx of Atrial fibrillation  who is being seen today for the evaluation of bradycardia at the request of Dr. Wilford Corner   History of Present Illness:   [name] lives in Assisted living. He has a history of chronic AFib and dementia. Other medical issues include history of diabetes, anemia, anxiety, CKD. In the remote past he had a PPM placed. This was explanted in 2014 due to bacteremia and he has not required pacing since then. He was brought to the ED today for altered mental status and left facial droop. Apparently en route by EMS he was noted to be bradycardic and transcutaneous pacing was initiated. Monitor strips from this unavailable. Continued to be paced on arrival here but available telemetry shows QRS complexes marching through the paced spikes. It was reported that while in CT HR dropped and patient became less responsive but again strips not available. He has been chronically on metoprolol 12.5 mg bid and Cardizem 120 mg daily for AFib. He was on coumadin chronically but later switched to ASA for unknown reasons. More recently diagnosed with DVT "behind the knee" and has been on Xarelto. Currently patient complains of pain from transcutaneous pacing. Notes intermittent dyspnea when supine. No chest pain. Feels weak. Is able to give some medical history and describe his home situation.  Past Medical History:  Diagnosis Date  . Alzheimer's type dementia (HCC) 12/07/2016  . Anemia   . Anxiety   . Arthritis   . Bacteremia   . Bradycardia    a. PPM removed in 2014 due to bacteremia.  . Chronic atrial fibrillation   . Chronic right shoulder pain  06/17/2015  . CKD (chronic kidney disease), stage III (HCC)   . Complication of anesthesia    " Terrible Nightmares"  . Constipation   . Depression   . Diabetes mellitus without complication (HCC)    Type II  . DVT (deep venous thrombosis) (HCC)    a. in 2019 - pt reports behind knee - was on Xarelto 15mg  BID now 20mg  daily.  . Encounter for removal of cardiac resynchronization therapy pacemaker   . Grief 06/17/2015  . History of blood product transfusion   . Hypertension   . Hypothyroidism   . Infected pacemaker (HCC)   . Major depression 06/17/2015  . MSSA (methicillin susceptible Staphylococcus aureus) infection   . OAB (overactive bladder)   . Overactive bladder   . Septic hip (HCC)   . Septicemia (HCC)   . Severe depression (HCC) 12/07/2016  . Staphylococcus aureus infection   . Thoracic spine pain 11/28/2014    Past Surgical History:  Procedure Laterality Date  . COLONOSCOPY W/ POLYPECTOMY     small  . EYE SURGERY     bilateral  . HIP CLOSED REDUCTION Right 10/02/2012   Procedure: CLOSED REDUCTION HIP;  Surgeon: Velna Ochs, MD;  Location: [facility] OR;  Service: Orthopedics;  Laterality: Right;  . ICD LEAD REMOVAL Left 08/31/2012   Procedure: ICD LEAD REMOVAL;  Surgeon: Marinus Maw, MD;  Location: [facility] OR;  Service: Cardiovascular;  Laterality: Left;  . INCISION AND DRAINAGE HIP Right 09/02/2012   Procedure: IRRIGATION AND DEBRIDEMENT RIGHT  HIP;  Surgeon: Nestor Lewandowsky, MD;  Location: [facility] OR;  Service: Orthopedics;  Laterality: Right;  . INCISION AND DRAINAGE HIP Right 08/10/2014   Procedure: IRRIGATION AND DEBRIDEMENT RIGHT HIP SUPERFICIAL VS DEEP;  Surgeon: Gean Birchwood, MD;  Location: [facility] OR;  Service: Orthopedics;  Laterality: Right;  . JOINT REPLACEMENT    . LUMBAR LAMINECTOMY/DECOMPRESSION MICRODISCECTOMY  02/14/2012   Procedure: LUMBAR LAMINECTOMY/DECOMPRESSION MICRODISCECTOMY 1 LEVEL;  Surgeon: Tia Alert, MD;  Location: [facility] NEURO ORS;  Service: Neurosurgery;  Laterality:  N/A;  Thoracic twelve - Lumbar one decompressive laminectomy.  Marland Kitchen PACEMAKER INSERTION    . RADIOLOGY WITH ANESTHESIA N/A 01/10/2015   Procedure: MRI LUMBAR SPINE WITH/WITHOUT   (RADIOLOGY WITH ANESTHESIA);  Surgeon: Medication Radiologist, MD;  Location: [facility] OR;  Service: Radiology;  Laterality: N/A;  . TEE WITHOUT CARDIOVERSION N/A 08/30/2012   Procedure: TRANSESOPHAGEAL ECHOCARDIOGRAM (TEE);  Surgeon: Laurey Morale, MD;  Location: [facility] ENDOSCOPY;  Service: Cardiovascular;  Laterality: N/A;  . TONSILLECTOMY    . TOTAL HIP REVISION Right 09/02/2012   Procedure: REVISION OF POLYETHYLENE LINER AND FEMORAL HEAD ;  Surgeon: Nestor Lewandowsky, MD;  Location: [facility] OR;  Service: Orthopedics;  Laterality: Right;  . TOTAL HIP REVISION Right 11/28/2012   Procedure: TOTAL HIP REVISION With Placement of Contrain Liner;  Surgeon: Nestor Lewandowsky, MD;  Location: [facility] OR;  Service: Orthopedics;  Laterality: Right;     Home Medications:  Prior to Admission medications   Medication Sig Start Date End Date Taking? Authorizing Provider  ALPRAZolam (XANAX) 0.25 MG tablet Take 0.25 mg by mouth 2 (two) times daily as needed for anxiety (do not give if Drowsiness is present).   Yes [provider]  aspirin 325 MG EC tablet Take 325 mg by mouth daily.   Yes [provider]  cephALEXin (KEFLEX) 500 MG capsule Take 1,000 mg by mouth 2 (two) times daily.   Yes [provider]  cyclobenzaprine (FLEXERIL) 5 MG tablet Take 10 mg by mouth 2 (two) times daily.    Yes [provider]  diltiazem (CARDIZEM CD) 120 MG 24 hr capsule Take 120 mg by mouth daily. 07/09/14  Yes [provider]  docusate sodium (COLACE) 100 MG capsule Take 100 mg by mouth 2 (two) times daily.    Yes [provider]  escitalopram (LEXAPRO) 10 MG tablet Take 10 mg by mouth daily. **Take with 5 mg tablet for total dosage of 15mg ** 10/17/17  Yes [provider]  escitalopram (LEXAPRO) 5 MG tablet Take 5 mg by  mouth daily. **Take with 10mg  tablet for total dosage of 15mg ** 10/21/17  Yes [provider]  ferrous sulfate 325 (65 FE) MG tablet Take 325 mg by mouth daily with breakfast.   Yes [provider]  finasteride (PROSCAR) 5 MG tablet Take 5 mg by mouth at bedtime.    Yes [provider]  HYDROcodone-acetaminophen (NORCO) 10-325 MG tablet Take 1 tablet by mouth 2 (two) times daily.  05/09/15  Yes [provider]  lactulose (CHRONULAC) 10 GM/15ML solution Take 10-30 g by mouth See admin instructions. Take 45 mls by mouth every am and 15 mls every evening for constipation Hold for loose stool   Yes [provider]  levothyroxine (SYNTHROID, LEVOTHROID) 88 MCG tablet Take 88 mcg by mouth daily before breakfast.   Yes [provider]  Lidocaine (ASPERCREME LIDOCAINE) 4 % PTCH Apply 1 patch topically every 12 (twelve) hours. Apply one patch between shoulder and elbow  on both sides each day for 12 hrs AM and PM off   Yes [provider]  lovastatin (MEVACOR) 40 MG tablet Take 40 mg by mouth at bedtime.  07/09/14  Yes [provider]  magnesium hydroxide (MILK OF MAGNESIA) 400 MG/5ML suspension Take 30 mLs by mouth 2 (two) times daily as needed for mild constipation.   Yes [provider]  metoprolol tartrate (LOPRESSOR) 25 MG tablet Take 12.5 mg by mouth daily. 08/14/17  Yes [provider]  pioglitazone (ACTOS) 15 MG tablet Take 15 mg by mouth daily.   Yes [provider]  potassium chloride SA (K-DUR,KLOR-CON) 20 MEQ tablet Take 10 mEq by mouth See admin instructions. TAKE HALF TABLET BY MOUTH ONLY MON, TUES, THURS AND FRIDAY   Yes [provider]  rOPINIRole (REQUIP) 0.5 MG tablet Take 0.5 mg by mouth at bedtime.  10/18/17  Yes [provider]  telmisartan (MICARDIS) 40 MG tablet Take 40 mg by mouth daily.   Yes [provider]  terazosin (HYTRIN) 5 MG capsule Take 5 mg by mouth at  bedtime.  11/15/12  Yes [provider]  torsemide (DEMADEX) 10 MG tablet Take 10 mg by mouth daily.    Yes [provider]  XARELTO 20 MG TABS tablet Take 20 mg by mouth daily.  10/18/17  Yes [provider]  ONE TOUCH ULTRA TEST test strip  11/07/14   [provider]    Inpatient Medications: Scheduled Meds:  Continuous Infusions:  PRN Meds:   Allergies:    Allergies  Allergen Reactions  . Ace Inhibitors Other (See Comments)    Cough Cough  Cough  . Gabapentin   . Nsaids Other (See Comments)    Renal insuff Other reaction(s): Other (See Comments) Renal insuff Renal insuff   . Zolpidem Other (See Comments)    hallucinations Nightmares, irritable, nervouse Disorientation    Social History:   Social History   Socioeconomic History  . Marital status: Married    Spouse name: Not on file  . Number of children: Not on file  . Years of education: Not on file  . Highest education level: Not on file  Occupational History  . Not on file  Social Needs  . Financial resource strain: Not on file  . Food insecurity:    Worry: Not on file    Inability: Not on file  . Transportation needs:    Medical: Not on file    Non-medical: Not on file  Tobacco Use  . Smoking status: Never Smoker  . Smokeless tobacco: Never Used  Substance and Sexual Activity  . Alcohol use: No    Alcohol/week: 0.0 standard drinks  . Drug use: No  . Sexual activity: Not on file  Lifestyle  . Physical activity:    Days per week: Not on file    Minutes per session: Not on file  . Stress: Not on file  Relationships  . Social connections:    Talks on phone: Not on file    Gets together: Not on file    Attends religious service: Not on file    Active member of club or organization: Not on file    Attends meetings of clubs or organizations: Not on file    Relationship status: Not on file  . Intimate partner violence:    Fear of current or ex partner: Not on  file    Emotionally abused: Not on file    Physically abused: Not on  file    Forced sexual activity: Not on file  Other Topics Concern  . Not on file  Social History Narrative  . Not on file    Family History:    Family History  Problem Relation Age of Onset  . Heart failure Mother      ROS:  Please see the history of present illness.   All other ROS reviewed and negative.     Physical Exam/Data:   Vitals:   11/03/17 1735 11/03/17 1741 11/03/17 1748  BP: (!) 104/58 (!) 151/77 136/78  Pulse:  64 (!) 53  Resp:  15 20  SpO2:  100% 100%   No intake or output data in the 24 hours ending 11/03/17 1805 There were no vitals filed for this visit. There is no height or weight on file to calculate BMI.  General:  Well nourished, obese, in no acute distress HEENT: left facial droop Lymph: no adenopathy Neck: no JVD Endocrine:  No thryomegaly Vascular: No carotid bruits; FA pulses 2+ bilaterally without bruits  Cardiac:  normal S1, S2; IRRR; no murmur  Lungs:  clear to auscultation anteriorly, no wheezing, rhonchi or rales  Abd: soft, nontender, no hepatomegaly  Ext: no edema Musculoskeletal:  No deformities, BUE and BLE strength normal and equal Skin: warm and dry  Neuro:  CNs 2-12 intact, no focal abnormalities noted Psych:  Normal affect   EKG:  The EKG was personally reviewed and demonstrates:  AFib with controlled rate 60. Low voltage. I have personally reviewed and interpreted this study.  Telemetry:  Telemetry was personally reviewed and demonstrates:  AFib with controlled rate 60.   Relevant CV Studies: none  Laboratory Data:  Chemistry Recent Labs  Lab 11/03/17 1715 11/03/17 1721 11/03/17 1738  NA 139 140 139  K 4.5 4.5 4.5  CL 106 105 106  CO2 24  --   --   GLUCOSE 116* 111* 110*  BUN 33* 33* 33*  CREATININE 1.52* 1.50* 1.50*  CALCIUM 9.1  --   --   GFRNONAA 40*  --   --   GFRAA 46*  --   --   ANIONGAP 9  --   --     Recent Labs  Lab  11/03/17 1715  PROT 6.2*  ALBUMIN 3.2*  AST 19  ALT 13  ALKPHOS 82  BILITOT 0.6   Hematology Recent Labs  Lab 11/03/17 1715 11/03/17 1721 11/03/17 1738  WBC 6.8  --   --   RBC 3.06*  --   --   HGB 9.2* 9.5* 9.5*  HCT 30.5* 28.0* 28.0*  MCV 99.7  --   --   MCH 30.1  --   --   MCHC 30.2  --   --   RDW 14.1  --   --   PLT 187  --   --    Cardiac EnzymesNo results for input(s): TROPONINI in the last 168 hours.  Recent Labs  Lab 11/03/17 1720  TROPIPOC 0.02    BNPNo results for input(s): BNP, PROBNP in the last 168 hours.  DDimer No results for input(s): DDIMER in the last 168 hours.  Radiology/Studies:  Ct Head Code Stroke Wo Contrast  Result Date: 11/03/2017 CLINICAL DATA:  Code stroke. 84 year old male with left facial droop and arm weakness. Last known normal 1515 hours. EXAM: CT HEAD WITHOUT CONTRAST TECHNIQUE: Contiguous axial images were obtained from the base of the skull through the vertex without intravenous contrast. COMPARISON:  CT head 10/25/2017. FINDINGS:  Brain: Stable cerebral volume with mild-to-moderate ventriculomegaly. No transependymal edema suspected. Patchy white matter hypodensity at the right posterior limb internal capsule and lentiform is stable. No acute intracranial hemorrhage, mass effect, or acute cortically based infarct. Vascular: Calcified atherosclerosis at the skull base. Trace gas in the right cavernous sinus. Chronic calcified right MCA atherosclerosis. No suspicious intracranial vascular hyperdensity. Skull: No acute osseous abnormality identified. Advanced degenerative changes in the visible upper cervical spine. Sinuses/Orbits: Visualized paranasal sinuses and mastoids are stable and well pneumatized. Other: Stable orbits soft tissues. Nonspecific soft tissue gas at the right forehead and right temporal region. Additionally, there is scattered right masticator space gas which appears to be intravenous (series 4, image 22). No definite acute  soft tissue injury. ASPECTS ([place]berta Stroke Program Early CT Score) - Ganglionic level infarction (caudate, lentiform nuclei, internal capsule, insula, M1-M3 cortex): 7 - Supraganglionic infarction (M4-M6 cortex): 3 Total score (0-10 with 10 being normal): 10 IMPRESSION: 1. Stable CT appearance of the brain with no acute cortically based infarct or intracranial hemorrhage identified. ASPECTS is 10. 2. Gas in the right face appears to be within venous structures, and there is trace gas in the right cavernous sinus. This can be seen as a sequelae of recent intravenous access. 3. These results were communicated to Dr. Wilford Corner at 5:39 pmon 10/9/2019by text page via the [facility] messaging system. Electronically Signed   By: Odessa Fleming M.D.   On: 11/03/2017 17:39    Assessment and Plan:   1. Atrial fibrillation. Currently rate controlled and not requiring pacing. I am not convinced HR was ever really slow since available monitor strips show QRS complexes marching through the pacer spikes. The pacer is clearly undersensing due to low QRS voltage. It is certainly possible he may have transient heart block. At this point recommend holding metoprolol and diltiazem. Needs to be on constant telemetry monitoring. I would admit to step down bed. If he should have symptomatic bradycardia we may need to consider a temporary transvenous pacemaker until rate slowing medications wash out. I think it is unlikely he will need a permanent pacemaker. I would cycle cardiac enzymes to make sure he hasn't had an ischemic event. We will follow as consultants with admitting team. Need to clarify DNR/end of life wishes 2. Mental status changes and facial droop. Neurology evaluating. Initial head CT negative for acute findings.  3. DVT on Xarelto. Recommend holding ASA while on Xarelto\ 4. CKD stage 3 - unchanged 5. Chronic anemia. Stable.  6. DM type 2 7. HTN      For questions or updates, please contact [facility] Please consult  [email or web address] for contact info under     Signed, Peter [place], MD  11/03/2017 6:05 PM

## 2017-11-03 NOTE — H&P (Addendum)
[facility]                                                                                    Patient Demographics  [name], is a 84 y.o. male  CSN: [id number]  MRN: [id number]  DOB - 09-18-1930  Admit Date - 11/03/2017  Outpatient Primary MD for the patient is [name]., MD   With History of -  Past Medical History:  Diagnosis Date  . Alzheimer's type dementia (HCC) 12/07/2016  . Anemia   . Anxiety   . Arthritis   . Bacteremia   . Bradycardia    a. PPM removed in 2014 due to bacteremia.  . Chronic atrial fibrillation   . Chronic right shoulder pain 06/17/2015  . CKD (chronic kidney disease), stage III (HCC)   . Complication of anesthesia    " Terrible Nightmares"  . Constipation   . Depression   . Diabetes mellitus without complication (HCC)    Type II  . DVT (deep venous thrombosis) (HCC)    a. in 2019 - pt reports behind knee - was on Xarelto 15mg  BID now 20mg  daily.  . Encounter for removal of cardiac resynchronization therapy pacemaker   . Grief 06/17/2015  . History of blood product transfusion   . Hypertension   . Hypothyroidism   . Infected pacemaker (HCC)   . Major depression 06/17/2015  . MSSA (methicillin susceptible Staphylococcus aureus) infection   . OAB (overactive bladder)   . Overactive bladder   . Septic hip (HCC)   . Septicemia (HCC)   . Severe depression (HCC) 12/07/2016  . Staphylococcus aureus infection   . Thoracic spine pain 11/28/2014      Past Surgical History:  Procedure Laterality Date  . COLONOSCOPY W/ POLYPECTOMY     small  . EYE SURGERY     bilateral  . HIP CLOSED REDUCTION Right 10/02/2012   Procedure: CLOSED REDUCTION HIP;  Surgeon: Velna Ochs, MD;  Location: [facility] OR;  Service: Orthopedics;  Laterality: Right;  . ICD LEAD REMOVAL Left 08/31/2012   Procedure: ICD LEAD REMOVAL;  Surgeon: Marinus Maw, MD;  Location: [facility] OR;  Service: Cardiovascular;  Laterality: Left;  . INCISION AND DRAINAGE HIP  Right 09/02/2012   Procedure: IRRIGATION AND DEBRIDEMENT RIGHT HIP;  Surgeon: Nestor Lewandowsky, MD;  Location: [facility] OR;  Service: Orthopedics;  Laterality: Right;  . INCISION AND DRAINAGE HIP Right 08/10/2014   Procedure: IRRIGATION AND DEBRIDEMENT RIGHT HIP SUPERFICIAL VS DEEP;  Surgeon: Gean Birchwood, MD;  Location: [facility] OR;  Service: Orthopedics;  Laterality: Right;  . JOINT REPLACEMENT    . LUMBAR LAMINECTOMY/DECOMPRESSION MICRODISCECTOMY  02/14/2012   Procedure: LUMBAR LAMINECTOMY/DECOMPRESSION MICRODISCECTOMY 1 LEVEL;  Surgeon: Tia Alert, MD;  Location: [facility] NEURO ORS;  Service: Neurosurgery;  Laterality: N/A;  Thoracic twelve - Lumbar one decompressive laminectomy.  Marland Kitchen PACEMAKER INSERTION    . RADIOLOGY WITH ANESTHESIA N/A 01/10/2015   Procedure: MRI LUMBAR SPINE WITH/WITHOUT   (RADIOLOGY WITH ANESTHESIA);  Surgeon: Medication Radiologist, MD;  Location: [facility] OR;  Service: Radiology;  Laterality: N/A;  . TEE WITHOUT CARDIOVERSION N/A 08/30/2012   Procedure: TRANSESOPHAGEAL ECHOCARDIOGRAM (TEE);  Surgeon: Laurey Morale, MD;  Location[facility] ENDOSCOPY;  Service: Cardiovascular;  Laterality: N/A;  . TONSILLECTOMY    . TOTAL HIP REVISION Right 09/02/2012   Procedure: REVISION OF POLYETHYLENE LINER AND FEMORAL HEAD ;  Surgeon: Nestor Lewandowsky, MD;  Location: [facility] OR;  Service: Orthopedics;  Laterality: Right;  . TOTAL HIP REVISION Right 11/28/2012   Procedure: TOTAL HIP REVISION With Placement of Contrain Liner;  Surgeon: Nestor Lewandowsky, MD;  Location: [facility] OR;  Service: Orthopedics;  Laterality: Right;    in for   Chief Complaint  Patient presents with  . Code Stroke     HPI  [name]  is a 84 y.o. male, with past medical history significant for bradycardia status post pacemaker in the past removed due to sepsis in 2017, history of Alzheimer's dementia and DVTs on Xarelto was transferred from his SNF for altered mental status and unresponsiveness at 16: 30.  Patient was found slumped over in his  wheelchair not responding.  In route patient was noted to have bradycardia and he was started on transcutaneous pacing.  In the emergency room his heart rate started picking up and he was seen by cardiology and neurology.  His mental status improved and CT of the head was done and was basically negative for acute events.  His wife was at bedside.  Patient was complaining earlier of lower extremity pain and was confused and now he is feeling little better but still slightly confused.    Review of Systems    Unable to obtain due to patient's condition  Social History Social History   Tobacco Use  . Smoking status: Never Smoker  . Smokeless tobacco: Never Used  Substance Use Topics  . Alcohol use: No    Alcohol/week: 0.0 standard drinks     Family History Family History  Problem Relation Age of Onset  . Heart failure Mother      Prior to Admission medications   Medication Sig Start Date End Date Taking? Authorizing Provider  ALPRAZolam (XANAX) 0.25 MG tablet Take 0.25 mg by mouth 2 (two) times daily as needed for anxiety (do not give if Drowsiness is present).   Yes [provider]  aspirin 325 MG EC tablet Take 325 mg by mouth daily.   Yes [provider]  cephALEXin (KEFLEX) 500 MG capsule Take 1,000 mg by mouth 2 (two) times daily.   Yes [provider]  cyclobenzaprine (FLEXERIL) 5 MG tablet Take 10 mg by mouth 2 (two) times daily.    Yes [provider]  diltiazem (CARDIZEM CD) 120 MG 24 hr capsule Take 120 mg by mouth daily. 07/09/14  Yes [provider]  docusate sodium (COLACE) 100 MG capsule Take 100 mg by mouth 2 (two) times daily.    Yes [provider]  escitalopram (LEXAPRO) 10 MG tablet Take 15 mg by mouth daily.  10/17/17  Yes [provider]  ferrous sulfate 325 (65 FE) MG tablet Take 325 mg by mouth daily with breakfast.   Yes [provider]  finasteride (PROSCAR) 5 MG tablet Take 5 mg by mouth  at bedtime.    Yes [provider]  HYDROcodone-acetaminophen (NORCO) 10-325 MG tablet Take 1 tablet by mouth 2 (two) times daily.  05/09/15  Yes [provider]  lactulose (CHRONULAC) 10 GM/15ML solution Take 10-30 g by mouth See admin instructions. Take 45 mls by mouth every am and 15 mls every evening for constipation Hold for loose stool  Yes [provider]  levothyroxine (SYNTHROID, LEVOTHROID) 88 MCG tablet Take 88 mcg by mouth daily before breakfast.   Yes [provider]  Lidocaine (ASPERCREME LIDOCAINE) 4 % PTCH Apply 1 patch topically every 12 (twelve) hours. Apply one patch between shoulder and elbow on both sides each day for 12 hrs AM and PM off   Yes [provider]  lovastatin (MEVACOR) 40 MG tablet Take 40 mg by mouth at bedtime.  07/09/14  Yes [provider]  magnesium hydroxide (MILK OF MAGNESIA) 400 MG/5ML suspension Take 30 mLs by mouth 2 (two) times daily as needed for mild constipation.   Yes [provider]  metoprolol tartrate (LOPRESSOR) 25 MG tablet Take 12.5 mg by mouth daily. 08/14/17  Yes [provider]  pioglitazone (ACTOS) 15 MG tablet Take 15 mg by mouth daily.   Yes [provider]  potassium chloride SA (K-DUR,KLOR-CON) 20 MEQ tablet Take 10 mEq by mouth See admin instructions. TAKE 10 mg BY MOUTH ONLY MON, TUES, THURS AND FRIDAY   Yes [provider]  rOPINIRole (REQUIP) 0.5 MG tablet Take 0.5 mg by mouth at bedtime.  10/18/17  Yes [provider]  telmisartan (MICARDIS) 40 MG tablet Take 40 mg by mouth daily.   Yes [provider]  terazosin (HYTRIN) 5 MG capsule Take 5 mg by mouth at bedtime.  11/15/12  Yes [provider]  torsemide (DEMADEX) 10 MG tablet Take 10 mg by mouth daily.    Yes [provider]  XARELTO 20 MG TABS tablet Take 20 mg by mouth daily.  10/18/17  Yes [provider]  ONE TOUCH ULTRA TEST test strip  11/07/14    [provider]    Allergies  Allergen Reactions  . Ace Inhibitors Other (See Comments)    Cough Cough  Cough  . Gabapentin   . Nsaids Other (See Comments)    Renal insuff Other reaction(s): Other (See Comments) Renal insuff Renal insuff   . Zolpidem Other (See Comments)    hallucinations Nightmares, irritable, nervouse Disorientation    Physical Exam  Vitals  Blood pressure 103/62, pulse 99, temperature (!) 97.5 F (36.4 C), temperature source Oral, resp. rate 16, SpO2 93 %.   1. General no acute distress, well-developed, well-nourished  2. Normal affect and insight, Not Suicidal or Homicidal, pleasantly confused.  3.  No facial deviation, pupils equal reactive to light with extraocular muscles intact.  Tongue midline.  Upper extremities 5/over 5. unable to move lower extremities with decreased sensation.  4. Ears and Eyes appear Normal, Conjunctivae clear, . Moist Oral Mucosa.  5. Supple Neck, No JVD, No cervical lymphadenopathy appriciated, No Carotid Bruits.  6. Symmetrical Chest wall movement, Good air movement bilaterally, CTAB.  7. RRR, No Gallops, Rubs or Murmurs, No Parasternal Heave.  8. Positive Bowel Sounds, Abdomen Soft, Non tender, No organomegaly appriciated,No rebound -guarding or rigidity.  9.  No Cyanosis, Normal Skin Turgor, No Skin Rash or Bruise.      Data Review  CBC Recent Labs  Lab 11/03/17 1715 11/03/17 1721 11/03/17 1738  WBC 6.8  --   --   HGB 9.2* 9.5* 9.5*  HCT 30.5* 28.0* 28.0*  PLT 187  --   --   MCV 99.7  --   --   MCH 30.1  --   --   MCHC 30.2  --   --   RDW 14.1  --   --   LYMPHSABS 1.4  --   --  MONOABS 0.9  --   --   EOSABS 0.2  --   --   BASOSABS 0.1  --   --    ------------------------------------------------------------------------------------------------------------------  Chemistries  Recent Labs  Lab 11/03/17 1715 11/03/17 1721 11/03/17 1738  NA 139 140 139  K 4.5 4.5 4.5  CL 106  105 106  CO2 24  --   --   GLUCOSE 116* 111* 110*  BUN 33* 33* 33*  CREATININE 1.52* 1.50* 1.50*  CALCIUM 9.1  --   --   AST 19  --   --   ALT 13  --   --   ALKPHOS 82  --   --   BILITOT 0.6  --   --    ------------------------------------------------------------------------------------------------------------------ CrCl cannot be calculated (Unknown ideal weight.). ------------------------------------------------------------------------------------------------------------------ No results for input(s): TSH, T4TOTAL, T3FREE, THYROIDAB in the last 72 hours.  Invalid input(s): FREET3   Coagulation profile Recent Labs  Lab 11/03/17 1715  INR 2.44   ------------------------------------------------------------------------------------------------------------------- No results for input(s): DDIMER in the last 72 hours. -------------------------------------------------------------------------------------------------------------------  Cardiac Enzymes No results for input(s): CKMB, TROPONINI, MYOGLOBIN in the last 168 hours.  Invalid input(s): CK ------------------------------------------------------------------------------------------------------------------ Invalid input(s): POCBNP   ---------------------------------------------------------------------------------------------------------------  Urinalysis    Component Value Date/Time   COLORURINE YELLOW 10/25/2017 1856   APPEARANCEUR CLEAR 10/25/2017 1856   LABSPEC 1.010 10/25/2017 1856   PHURINE 7.0 10/25/2017 1856   GLUCOSEU NEGATIVE 10/25/2017 1856   HGBUR SMALL (A) 10/25/2017 1856   BILIRUBINUR NEGATIVE 10/25/2017 1856   KETONESUR NEGATIVE 10/25/2017 1856   PROTEINUR NEGATIVE 10/25/2017 1856   UROBILINOGEN 0.2 12/30/2012 2015   NITRITE NEGATIVE 10/25/2017 1856   LEUKOCYTESUR MODERATE (A) 10/25/2017 1856     ----------------------------------------------------------------------------------------------------------------   Imaging results:   Ct Head Wo Contrast  Result Date: 10/25/2017 CLINICAL DATA:  Altered level of consciousness.  Multiple falls EXAM: CT HEAD WITHOUT CONTRAST TECHNIQUE: Contiguous axial images were obtained from the base of the skull through the vertex without intravenous contrast. COMPARISON:  None. FINDINGS: Brain: Advanced atrophy. Diffuse ventricular enlargement compatible with atrophy. Mild chronic appearing changes in the white matter. Negative for acute infarct, hemorrhage, or mass. Negative for extra-axial fluid collection or midline shift. Vascular: Extensive arterial calcification. Negative for hyperdense vessel Skull: Negative Sinuses/Orbits: Bilateral cataract surgery. Mild mucosal edema paranasal sinuses. Other: None IMPRESSION: Atrophy and chronic microvascular ischemia. Extensive atherosclerotic disease. No acute abnormality. Electronically Signed   By: Marlan Palau M.D.   On: 10/25/2017 16:12   Dg Chest Portable 1 View  Result Date: 10/25/2017 CLINICAL DATA:  Patient with weakness. EXAM: PORTABLE CHEST 1 VIEW COMPARISON:  Chest radiograph 08/12/2017 FINDINGS: Monitoring leads overlie the patient. Stable cardiomegaly. Aortic atherosclerosis. No consolidative pulmonary opacities. No pleural effusion or pneumothorax. Thoracic spine degenerative changes. IMPRESSION: No acute cardiopulmonary process.  Cardiomegaly. Electronically Signed   By: Annia Belt M.D.   On: 10/25/2017 15:01   Ct Head Code Stroke Wo Contrast  Result Date: 11/03/2017 CLINICAL DATA:  Code stroke. 84 year old male with left facial droop and arm weakness. Last known normal 1515 hours. EXAM: CT HEAD WITHOUT CONTRAST TECHNIQUE: Contiguous axial images were obtained from the base of the skull through the vertex without intravenous contrast. COMPARISON:  CT head 10/25/2017. FINDINGS: Brain: Stable  cerebral volume with mild-to-moderate ventriculomegaly. No transependymal edema suspected. Patchy white matter hypodensity at the right posterior limb internal capsule and lentiform is stable. No acute intracranial hemorrhage, mass effect, or acute cortically based infarct. Vascular: Calcified atherosclerosis at the skull  base. Trace gas in the right cavernous sinus. Chronic calcified right MCA atherosclerosis. No suspicious intracranial vascular hyperdensity. Skull: No acute osseous abnormality identified. Advanced degenerative changes in the visible upper cervical spine. Sinuses/Orbits: Visualized paranasal sinuses and mastoids are stable and well pneumatized. Other: Stable orbits soft tissues. Nonspecific soft tissue gas at the right forehead and right temporal region. Additionally, there is scattered right masticator space gas which appears to be intravenous (series 4, image 22). No definite acute soft tissue injury. ASPECTS (Alberta Stroke Program Early CT Score) - Ganglionic level infarction (caudate, lentiform nuclei, internal capsule, insula, M1-M3 cortex): 7 - Supraganglionic infarction (M4-M6 cortex): 3 Total score (0-10 with 10 being normal): 10 IMPRESSION: 1. Stable CT appearance of the brain with no acute cortically based infarct or intracranial hemorrhage identified. ASPECTS is 10. 2. Gas in the right face appears to be within venous structures, and there is trace gas in the right cavernous sinus. This can be seen as a sequelae of recent intravenous access. 3. These results were communicated to Dr. Wilford Corner at 5:39 pmon 10/9/2019by text page via the [facility] messaging system. Electronically Signed   By: Odessa Fleming M.D.   On: 11/03/2017 17:39    My personal review of EKG: Slow A. fib at 55 bpm   Assessment & Plan  1.  Altered mental status; improving slowly     Cause not clear yet; cerebrovascular accident versus bradycardia induced hypoperfusion      Patient has baseline dementia and still partially  confused     Does not seem to have focal deficits at 8 PM     Plan     Will do echocardiogram and lipid profile      Will hold off the MRI and MRA for now.  Can probably order in a.m. after goals of care are discussed .  I do not think that this MRI and MRA will make a difference at this time.  Patient is improving and he is nonfocal at this time.    Patient will be kept n.p.o. and start IV fluids for now        2.  Questionable bradycardia .  Unable to confirm as per cardiology     Radiology on consult     Patient had the pacemaker removed in 2017 due to sepsis     Check TSH  3.  History of DVT on Xarelto  4.  Chronic kidney disease stage III; creatinine 1.5  5.  Diabetes mellitus type 2   DVT Prophylaxis Xarelto  AM Labs Ordered, also please review Full Orders  Family Communication: Admission, patients condition and plan of care including tests being ordered have been discussed with the patient and wife who indicate understanding and agree with the plan and Code Status.  Code Status full  Disposition Plan: Pending  Time spent in minutes : 58 minutes  Condition critical   @SIGNATURE @

## 2017-11-03 NOTE — ED Notes (Signed)
PAGED CARDS PER MD.  

## 2017-11-03 NOTE — ED Notes (Signed)
Attempted report and staff advised the nurse just got a pt from the OR and was not able to take report.

## 2017-11-04 ENCOUNTER — Inpatient Hospital Stay (HOSPITAL_COMMUNITY): Payer: Medicare Other

## 2017-11-04 ENCOUNTER — Inpatient Hospital Stay (HOSPITAL_BASED_OUTPATIENT_CLINIC_OR_DEPARTMENT_OTHER): Payer: Medicare Other

## 2017-11-04 DIAGNOSIS — R001 Bradycardia, unspecified: Secondary | ICD-10-CM | POA: Diagnosis not present

## 2017-11-04 DIAGNOSIS — I10 Essential (primary) hypertension: Secondary | ICD-10-CM | POA: Diagnosis not present

## 2017-11-04 DIAGNOSIS — N181 Chronic kidney disease, stage 1: Secondary | ICD-10-CM

## 2017-11-04 DIAGNOSIS — E1122 Type 2 diabetes mellitus with diabetic chronic kidney disease: Secondary | ICD-10-CM

## 2017-11-04 DIAGNOSIS — I4891 Unspecified atrial fibrillation: Secondary | ICD-10-CM

## 2017-11-04 DIAGNOSIS — R4182 Altered mental status, unspecified: Secondary | ICD-10-CM | POA: Diagnosis not present

## 2017-11-04 DIAGNOSIS — G459 Transient cerebral ischemic attack, unspecified: Secondary | ICD-10-CM | POA: Diagnosis not present

## 2017-11-04 DIAGNOSIS — R29898 Other symptoms and signs involving the musculoskeletal system: Secondary | ICD-10-CM | POA: Diagnosis present

## 2017-11-04 DIAGNOSIS — Z8659 Personal history of other mental and behavioral disorders: Secondary | ICD-10-CM

## 2017-11-04 DIAGNOSIS — I482 Chronic atrial fibrillation, unspecified: Secondary | ICD-10-CM

## 2017-11-04 DIAGNOSIS — R29810 Facial weakness: Secondary | ICD-10-CM | POA: Diagnosis not present

## 2017-11-04 LAB — URINALYSIS, ROUTINE W REFLEX MICROSCOPIC
Bilirubin Urine: NEGATIVE
GLUCOSE, UA: NEGATIVE mg/dL
Ketones, ur: NEGATIVE mg/dL
NITRITE: NEGATIVE
PH: 8 (ref 5.0–8.0)
Protein, ur: NEGATIVE mg/dL
SPECIFIC GRAVITY, URINE: 1.012 (ref 1.005–1.030)

## 2017-11-04 LAB — GLUCOSE, CAPILLARY
GLUCOSE-CAPILLARY: 81 mg/dL (ref 70–99)
GLUCOSE-CAPILLARY: 90 mg/dL (ref 70–99)
Glucose-Capillary: 80 mg/dL (ref 70–99)
Glucose-Capillary: 79 mg/dL (ref 70–99)

## 2017-11-04 LAB — RAPID URINE DRUG SCREEN, HOSP PERFORMED
Amphetamines: NOT DETECTED
Barbiturates: NOT DETECTED
Benzodiazepines: NOT DETECTED
Cocaine: NOT DETECTED
Opiates: POSITIVE — AB
TETRAHYDROCANNABINOL: NOT DETECTED

## 2017-11-04 LAB — LIPID PANEL
CHOL/HDL RATIO: 3.8 ratio
Cholesterol: 125 mg/dL (ref 0–200)
HDL: 33 mg/dL — AB (ref >40)
LDL CALC: 84 mg/dL (ref 0–99)
TRIGLYCERIDES: 40 mg/dL (ref <150)
VLDL: 8 mg/dL (ref 0–40)

## 2017-11-04 LAB — BASIC METABOLIC PANEL
Anion gap: 8 (ref 5–15)
BUN: 31 mg/dL — ABNORMAL HIGH (ref 8–23)
CALCIUM: 8.6 mg/dL — AB (ref 8.9–10.3)
CHLORIDE: 106 mmol/L (ref 98–111)
CO2: 24 mmol/L (ref 22–32)
CREATININE: 1.4 mg/dL — AB (ref 0.61–1.24)
GFR calc Af Amer: 51 mL/min — ABNORMAL LOW (ref >60)
GFR calc non Af Amer: 44 mL/min — ABNORMAL LOW (ref >60)
GLUCOSE: 87 mg/dL (ref 70–99)
Potassium: 4.6 mmol/L (ref 3.5–5.1)
Sodium: 138 mmol/L (ref 135–145)

## 2017-11-04 LAB — TROPONIN I
Troponin I: <0.03 ng/mL (ref <0.03)
Troponin I: <0.03 ng/mL (ref <0.03)

## 2017-11-04 LAB — ECHOCARDIOGRAM COMPLETE
Height: 71.5 in
Weight: 3901.26 oz

## 2017-11-04 LAB — HEMOGLOBIN A1C
Hgb A1c MFr Bld: 5.6 % (ref 4.8–5.6)
Mean Plasma Glucose: 114.02 mg/dL

## 2017-11-04 LAB — MRSA PCR SCREENING: MRSA by PCR: NEGATIVE

## 2017-11-04 MED ORDER — ROPINIROLE HCL 1 MG PO TABS
0.5000 mg | ORAL_TABLET | Freq: Every day | ORAL | Status: DC
  Administered 2017-11-04 – 2017-11-08 (×5): 0.5 mg via ORAL
  Filled 2017-11-04 (×5): qty 1

## 2017-11-04 MED ORDER — FINASTERIDE 5 MG PO TABS
5.0000 mg | ORAL_TABLET | Freq: Every day | ORAL | Status: DC
  Administered 2017-11-04 – 2017-11-08 (×5): 5 mg via ORAL
  Filled 2017-11-04 (×5): qty 1

## 2017-11-04 MED ORDER — ESCITALOPRAM OXALATE 10 MG PO TABS
15.0000 mg | ORAL_TABLET | Freq: Every day | ORAL | Status: DC
  Administered 2017-11-04 – 2017-11-09 (×6): 15 mg via ORAL
  Filled 2017-11-04 (×6): qty 2

## 2017-11-04 MED ORDER — HYDROCODONE-ACETAMINOPHEN 5-325 MG PO TABS
1.0000 | ORAL_TABLET | Freq: Two times a day (BID) | ORAL | Status: DC | PRN
  Administered 2017-11-05 – 2017-11-08 (×2): 1 via ORAL
  Filled 2017-11-04 (×2): qty 1

## 2017-11-04 MED ORDER — ALPRAZOLAM 0.25 MG PO TABS
0.2500 mg | ORAL_TABLET | Freq: Two times a day (BID) | ORAL | Status: DC | PRN
  Administered 2017-11-05 – 2017-11-07 (×3): 0.25 mg via ORAL
  Filled 2017-11-04 (×3): qty 1

## 2017-11-04 NOTE — Progress Notes (Signed)
*  PRELIMINARY RESULTS* Vascular Ultrasound Carotid Duplex (Doppler) has been completed.  Findings suggest 1-39% internal carotid artery stenosis bilaterally. Vertebral arteries are patent with antegrade flow.  11/04/2017 3:14 PM Gertie Fey, MHA, RVT, RDCS, RDMS

## 2017-11-04 NOTE — Progress Notes (Addendum)
Neurology Progress Note   S:// Seen and examined. Remains off of any pacer. Much more awake today and able to provide reliable hisotry. Cardiology following  O:// Current vital signs: BP 117/67 (BP Location: Right Arm)   Pulse 77   Temp 97.9 F (36.6 C) (Oral)   Resp 16   Ht 5' 11.5" (1.816 m)   Wt 110.6 kg   SpO2 100%   BMI 33.53 kg/m  Vital signs in last 24 hours: Temp:  [97.5 F (36.4 C)-98.2 F (36.8 C)] 97.9 F (36.6 C) (10/10 0815) Pulse Rate:  [51-99] 77 (10/10 0815) Resp:  [11-20] 16 (10/10 0815) BP: (103-151)/(45-118) 117/67 (10/10 0815) SpO2:  [93 %-100 %] 100 % (10/10 0815) Weight:  [110.6 kg] 110.6 kg (10/09 2303) Gen: WD WN NAD HEENT: [place] AT MMM CVS: S1S2+, RRR Resp: CTABL Abd: ND NT Neurological MS: AAOx3, speech is clear, naming, comprehension and repetition is intact. CN: PERRL, EOMI, VFF, Face symmetric, facial sensation intact, auditory acuity normal, palate and tongue midline Motor: UE 5/5 b/l, able to wiggle toes b/l but unable to flex hip or knee Sensory: Intact light touch all over Cerebellar: Intact finger-nose finger bilaterally Gait testing was deferred   Medications  Current Facility-Administered Medications:  .  acetaminophen (TYLENOL) tablet 650 mg, 650 mg, Oral, Q6H PRN **OR** acetaminophen (TYLENOL) suppository 650 mg, 650 mg, Rectal, Q6H PRN, Hijazi, Ali, MD .  dextrose 5 %-0.45 % sodium chloride infusion, , Intravenous, Continuous, Hijazi, Ali, MD, Last Rate: 50 mL/hr at 11/04/17 0400 .  HYDROcodone-acetaminophen (NORCO/VICODIN) 5-325 MG per tablet 1-2 tablet, 1-2 tablet, Oral, Q4H PRN, Carron Curie, MD, 1 tablet at 11/04/17 0231 .  Influenza vac split quadrivalent PF (FLUZONE HIGH-DOSE) injection 0.5 mL, 0.5 mL, Intramuscular, Tomorrow-1000, Hijazi, Ali, MD .  insulin aspart (novoLOG) injection 0-9 Units, 0-9 Units, Subcutaneous, TID WC, Hijazi, Ali, MD .  levothyroxine (SYNTHROID, LEVOTHROID) tablet 88 mcg, 88 mcg, Oral, QAC  breakfast, Carron Curie, MD, 88 mcg at 11/04/17 0618 .  ondansetron (ZOFRAN) tablet 4 mg, 4 mg, Oral, Q6H PRN **OR** ondansetron (ZOFRAN) injection 4 mg, 4 mg, Intravenous, Q6H PRN, Carron Curie, MD .  rivaroxaban (XARELTO) tablet 20 mg, 20 mg, Oral, Q supper, Carron Curie, MD Labs CBC    Component Value Date/Time   WBC 6.8 11/03/2017 1715   RBC 3.06 (L) 11/03/2017 1715   HGB 9.5 (L) 11/03/2017 1738   HGB 11.2 (L) 03/21/2012 1426   HCT 28.0 (L) 11/03/2017 1738   HCT 32.8 (L) 03/21/2012 1426   PLT 187 11/03/2017 1715   PLT 167 03/21/2012 1426   MCV 99.7 11/03/2017 1715   MCV 94.9 03/21/2012 1426   MCH 30.1 11/03/2017 1715   MCHC 30.2 11/03/2017 1715   RDW 14.1 11/03/2017 1715   RDW 13.8 03/21/2012 1426   LYMPHSABS 1.4 11/03/2017 1715   LYMPHSABS 1.3 03/21/2012 1426   MONOABS 0.9 11/03/2017 1715   MONOABS 0.7 03/21/2012 1426   EOSABS 0.2 11/03/2017 1715   EOSABS 0.1 03/21/2012 1426   BASOSABS 0.1 11/03/2017 1715   BASOSABS 0.1 03/21/2012 1426    CMP     Component Value Date/Time   NA 138 11/04/2017 0521   NA 144 06/26/2017   NA 143 03/21/2012 1426   K 4.6 11/04/2017 0521   K 4.0 03/21/2012 1426   CL 106 11/04/2017 0521   CL 101 03/21/2012 1426   CO2 24 11/04/2017 0521   CO2 31 (H) 03/21/2012 1426   GLUCOSE 87 11/04/2017 0521  GLUCOSE 123 (H) 03/21/2012 1426   BUN 31 (H) 11/04/2017 0521   BUN 20 06/26/2017   BUN 43.7 Repeated and Verified (H) 03/21/2012 1426   CREATININE 1.40 (H) 11/04/2017 0521   CREATININE 1.47 (H) 02/24/2017 1554   CREATININE 1.9 Repeated and Verified (H) 03/21/2012 1426   CALCIUM 8.6 (L) 11/04/2017 0521   CALCIUM 9.3 03/21/2012 1426   PROT 6.2 (L) 11/03/2017 1715   PROT 6.9 03/21/2012 1426   ALBUMIN 3.2 (L) 11/03/2017 1715   ALBUMIN 3.2 (L) 03/21/2012 1426   AST 19 11/03/2017 1715   AST 18 03/21/2012 1426   ALT 13 11/03/2017 1715   ALT 15 03/21/2012 1426   ALKPHOS 82 11/03/2017 1715   ALKPHOS 74 03/21/2012 1426   BILITOT 0.6 11/03/2017  1715   BILITOT 0.42 03/21/2012 1426   GFRNONAA 44 (L) 11/04/2017 0521   GFRNONAA 43 (L) 02/24/2017 1554   GFRAA 51 (L) 11/04/2017 0521   GFRAA 50 (L) 02/24/2017 1554   Lipid Panel     Component Value Date/Time   CHOL 125 11/04/2017 0521   TRIG 40 11/04/2017 0521   HDL 33 (L) 11/04/2017 0521   CHOLHDL 3.8 11/04/2017 0521   VLDL 8 11/04/2017 0521   LDLCALC 84 11/04/2017 0521   Imaging I have reviewed images in epic and the results pertinent to this consultation are: CT-scan of the brain-no acute changes MRI examination of the brain-pending  Stroke labs LDL 84 A1c 5.6 2D echo pending  Assessment:  84 year old man was brought into the emergency department initially as a code stroke secondary to left facial droop and left arm weakness however he developed severe bradycardia down to the point where his heart rate was in the 20s, requiring pacing in the ER.  CT head was unremarkable. His heart rate is back into the normal range and he has an exam that is back to his baseline without any focal deficits-he has baseline bilateral lower extremity weakness and is wheelchair-bound at baseline. He has a history of atrial fibrillation and is on Xarelto, and the episode of bradycardia, could have put him at high risk for stroke or TIA for which I think he should complete a stroke/TIA work-up, including MRI brain.  Impression: Evaluate for stroke/TIA (cardioembolic v hypoperfusion in setting of symptomatic bradycardia) Evaluate for symptomatic bradycardia Evaluate for toxic metabolic encephalopathy  Recommendations: -Complete the work up by obtaining an MRI brain w/o contrast, and MRA head w/o contrast and carotid dopplers. Please call if results are concerning. -Reduce sedating medications as much as possible as it could have contributed to the encephalopathy as well as bradycardia -Exam reassuring, can continue anticoagulation with Xarelto. Asa per cardiology. -We will follow up on the  tests with further recs if needed. -Please call with questions.Marland Kitchen  --  Milon Dikes, MD [facility] Pager: [phone] If 7pm to 7am, please call on call as listed on AMION.

## 2017-11-04 NOTE — Discharge Instructions (Signed)

## 2017-11-04 NOTE — Clinical Social Work Note (Signed)
Clinical Social Work Assessment  Patient Details  Name: [name] MRN: [id number] Date of Birth: 06-21-1930  Date of referral:  11/04/17               Reason for consult:  Facility Placement, Discharge Planning                Permission sought to share information with:  Facility Sport and exercise psychologist, Family Supports Permission granted to share information::  Yes, Verbal Permission Granted  Name::     [name] Bowens   Agency::  [facility]  Relationship::  Spouse   Contact Information:  [phone]   Housing/Transportation Living arrangements for the past 2 months:  Effort of Information:  Patient, Facility Patient Interpreter Needed:  None Criminal Activity/Legal Involvement Pertinent to Current Situation/Hospitalization:  No - Comment as needed Significant Relationships:  Adult Children, Spouse Lives with:  Facility Resident Do you feel safe going back to the place where you live?  Yes Need for family participation in patient care:  Yes (Comment)  Care giving concerns: Patient from [facility]   Social Worker assessment / plan: CSW met with patient at bedside. Patient alert and oriented. CSW introduced self and role. Patient reported he has been at [facility] for approximately two months for rehab, but is starting to anticipate that he may need long term care at the facility. Patient talked about the challenges with his change in mobility and grief accompanying that change. CSW provided reflective and empathetic listening.  Patient's wife is unable to provide the support and level of care that patient needs at home. Patient indicated his daughters are helping him to apply for Medicaid, but he has not yet been approved.  CSW spoke to [facility] and they did confirm what patient reported. Patient would require a new UHC authorization to return to the facility, as he does not yet have long term care coverage with Medicaid.  PT evaluation is required in order to obtain [place]; paged MD for orders. CSW to follow for patient's medical readiness and will support with discharge planning.   Employment status:  Retired Research officer, political party) PT Recommendations:  Not assessed at this time Information / Referral to community resources:  [place]  Patient/Family's Response to care: Patient appreciative of care.  Patient/Family's Understanding of and Emotional Response to Diagnosis, Current Treatment, and Prognosis: Patient with understanding of his condition and wants to return to [facility].  Emotional Assessment Appearance:  Appears stated age Attitude/Demeanor/Rapport:  Engaged Affect (typically observed):  Accepting, Calm, Appropriate, Pleasant Orientation:  Oriented to Self, Oriented to Place, Oriented to  Time, Oriented to Situation Alcohol / Substance use:  Not Applicable Psych involvement (Current and /or in the community):  No (Comment)  Discharge Needs  Concerns to be addressed:  Discharge Planning Concerns, Care Coordination Readmission within the last 30 days:  No Current discharge risk:  Physical Impairment, Dependent with Mobility Barriers to Discharge:  Continued Medical Work up   Estanislado Emms, LCSW 11/04/2017, 10:49 AM

## 2017-11-04 NOTE — Evaluation (Signed)
Physical Therapy Evaluation Patient Details Name: [name] MRN: [id number] DOB: 1930-02-10 Today's Date: 11/04/2017   History of Present Illness  Pt is an 84 y/o male admitted secondary to L arm weakness and L facial droop. Found to have symtpmatic bradycardia. CT of head negative for acute abnormality. PMH includes a fib, DVT, CKD, DM, and HTN.   Clinical Impression  Pt admitted secondary to problem above with deficits below. Pt presenting with BLE weakness this session. Reports over the past few weeks, weakness has gotten worse to where he cannot walk. Required mod A to roll to side for repositioning. Performed PROM exercises on BLE. Pt very emotional about loosing his functional mobility and reports he wants to work hard with PT. Will continue to follow acutely to maximize functional mobility independence and safety.     Follow Up Recommendations SNF;Supervision for mobility/OOB    Equipment Recommendations  None recommended by PT    Recommendations for Other Services       Precautions / Restrictions Precautions Precautions: Fall Restrictions Weight Bearing Restrictions: No      Mobility  Bed Mobility Overal bed mobility: Needs Assistance Bed Mobility: Rolling Rolling: Mod assist         General bed mobility comments: Mod A for rolling to L to adjust bed pad underneath pt. Pt reporting some shoulder pain when pulling on rail with RUE.   Transfers                 General transfer comment: Deferred as pt's dinner tray arriving.   Ambulation/Gait                Stairs            Wheelchair Mobility    Modified Rankin (Stroke Patients Only)       Balance                                             Pertinent Vitals/Pain      Home Living Family/patient expects to be discharged to:: Skilled nursing facility                 Additional Comments: [facility]     Prior Function Level of Independence: Needs  assistance   Gait / Transfers Assistance Needed: Reports prior to the past couple of weeks, pt was walking with RW. However, within the past couple of weeks his LEs have gotten weaker and he has had to use the lift to get to the chair.   ADL's / Homemaking Assistance Needed: Needed assist for ADL tasks.         Hand Dominance        Extremity/Trunk Assessment   Upper Extremity Assessment Upper Extremity Assessment: Generalized weakness    Lower Extremity Assessment Lower Extremity Assessment: RLE deficits/detail;LLE deficits/detail RLE Deficits / Details: Able to perform AROM in ankle pump, however, unable to perform heel slide. PROM limited during heel slide.  LLE Deficits / Details: Unable to perform AROM DF or heel slide, however, with PROM demonstrated ROM WFL.        Communication   Communication: No difficulties  Cognition Arousal/Alertness: Awake/alert Behavior During Therapy: WFL for tasks assessed/performed Overall Cognitive Status: Within Functional Limits for tasks assessed  General Comments General comments (skin integrity, edema, etc.): Pt expressing frustrations and grief about not being able to ambulate.     Exercises General Exercises - Lower Extremity Ankle Circles/Pumps: AROM;Right;PROM;Left;10 reps Heel Slides: PROM;Both;10 reps   Assessment/Plan    PT Assessment Patient needs continued PT services  PT Problem List Decreased strength;Decreased activity tolerance;Decreased balance;Decreased mobility       PT Treatment Interventions DME instruction;Functional mobility training;Therapeutic activities;Therapeutic exercise;Balance training;Patient/family education    PT Goals (Current goals can be found in the Care Plan section)  Acute Rehab PT Goals Patient Stated Goal: to be able to walk again  PT Goal Formulation: With patient Time For Goal Achievement: 11/18/17 Potential to Achieve Goals:  Fair    Frequency Min 2X/week   Barriers to discharge        Co-evaluation               AM-PAC PT "6 Clicks" Daily Activity  Outcome Measure Difficulty turning over in bed (including adjusting bedclothes, sheets and blankets)?: Unable Difficulty moving from lying on back to sitting on the side of the bed? : Unable Difficulty sitting down on and standing up from a chair with arms (e.g., wheelchair, bedside commode, etc,.)?: Unable Help needed moving to and from a bed to chair (including a wheelchair)?: Total Help needed walking in hospital room?: Total Help needed climbing 3-5 steps with a railing? : Total 6 Click Score: 6    End of Session   Activity Tolerance: Patient tolerated treatment well Patient left: in bed;with call bell/phone within reach;with bed alarm set Nurse Communication: Mobility status PT Visit Diagnosis: Other abnormalities of gait and mobility (R26.89);Muscle weakness (generalized) (M62.81);Unsteadiness on feet (R26.81);Difficulty in walking, not elsewhere classified (R26.2)    Time: 1610-9604 PT Time Calculation (min) (ACUTE ONLY): 17 min   Charges:   PT Evaluation $PT Eval Moderate Complexity: 1 Mod          Gladys Damme, PT, DPT  Acute Rehabilitation Services  Pager: [phone] Office: [phone]   Lehman Prom 11/04/2017, 6:06 PM

## 2017-11-04 NOTE — Progress Notes (Signed)
Progress Note  Patient Name: [name] Date of Encounter: 11/04/2017  Primary Cardiologist: No primary care provider on file. New  Subjective   Feels well this am. No complaints. Appears much brighter and alert today  Inpatient Medications    Scheduled Meds: . Influenza vac split quadrivalent PF  0.5 mL Intramuscular Tomorrow-1000  . insulin aspart  0-9 Units Subcutaneous TID WC  . levothyroxine  88 mcg Oral QAC breakfast  . rivaroxaban  20 mg Oral Q supper   Continuous Infusions: . dextrose 5 % and 0.45% NaCl 50 mL/hr at 11/04/17 0400   PRN Meds: acetaminophen **OR** acetaminophen, HYDROcodone-acetaminophen, ondansetron **OR** ondansetron (ZOFRAN) IV   Vital Signs    Vitals:   11/03/17 2045 11/03/17 2303 11/04/17 0329 11/04/17 0815  BP: 108/61 132/73 119/64 117/67  Pulse: (!) 58 77 78 77  Resp: 14 20 17 16   Temp:  98.2 F (36.8 C) 98.1 F (36.7 C) 97.9 F (36.6 C)  TempSrc:  Oral Oral Oral  SpO2: 97% 100% 100% 100%  Weight:  110.6 kg    Height:  5' 11.5" (1.816 m)      Intake/Output Summary (Last 24 hours) at 11/04/2017 1005 Last data filed at 11/04/2017 0400 Gross per 24 hour  Intake 268.32 ml  Output -  Net 268.32 ml   Filed Weights   11/03/17 2303  Weight: 110.6 kg    Telemetry    Afib with rate 60s to 80. No pauses - Personally Reviewed  ECG    Afib, low voltage - Personally Reviewed  Physical Exam   GEN: No acute distress.   Neck: No JVD Cardiac: IRRR, no murmurs, rubs, or gallops.  Respiratory: Clear to auscultation bilaterally. GI: Soft, nontender, non-distended  MS: No edema; No deformity. Neuro:  Nonfocal  Psych: Normal affect   Labs    Chemistry Recent Labs  Lab 11/03/17 1715 11/03/17 1721 11/03/17 1738 11/04/17 0521  NA 139 140 139 138  K 4.5 4.5 4.5 4.6  CL 106 105 106 106  CO2 24  --   --  24  GLUCOSE 116* 111* 110* 87  BUN 33* 33* 33* 31*  CREATININE 1.52* 1.50* 1.50* 1.40*  CALCIUM 9.1  --   --  8.6*    PROT 6.2*  --   --   --   ALBUMIN 3.2*  --   --   --   AST 19  --   --   --   ALT 13  --   --   --   ALKPHOS 82  --   --   --   BILITOT 0.6  --   --   --   GFRNONAA 40*  --   --  44*  GFRAA 46*  --   --  51*  ANIONGAP 9  --   --  8     Hematology Recent Labs  Lab 11/03/17 1715 11/03/17 1721 11/03/17 1738  WBC 6.8  --   --   RBC 3.06*  --   --   HGB 9.2* 9.5* 9.5*  HCT 30.5* 28.0* 28.0*  MCV 99.7  --   --   MCH 30.1  --   --   MCHC 30.2  --   --   RDW 14.1  --   --   PLT 187  --   --     Cardiac Enzymes Recent Labs  Lab 11/03/17 2242 11/04/17 0521 11/04/17 0838  TROPONINI <0.03 <0.03 <0.03  Recent Labs  Lab 11/03/17 1720  TROPIPOC 0.02     BNPNo results for input(s): BNP, PROBNP in the last 168 hours.   DDimer No results for input(s): DDIMER in the last 168 hours.   Radiology    Ct Head Code Stroke Wo Contrast  Result Date: 11/03/2017 CLINICAL DATA:  Code stroke. 84 year old male with left facial droop and arm weakness. Last known normal 1515 hours. EXAM: CT HEAD WITHOUT CONTRAST TECHNIQUE: Contiguous axial images were obtained from the base of the skull through the vertex without intravenous contrast. COMPARISON:  CT head 10/25/2017. FINDINGS: Brain: Stable cerebral volume with mild-to-moderate ventriculomegaly. No transependymal edema suspected. Patchy white matter hypodensity at the right posterior limb internal capsule and lentiform is stable. No acute intracranial hemorrhage, mass effect, or acute cortically based infarct. Vascular: Calcified atherosclerosis at the skull base. Trace gas in the right cavernous sinus. Chronic calcified right MCA atherosclerosis. No suspicious intracranial vascular hyperdensity. Skull: No acute osseous abnormality identified. Advanced degenerative changes in the visible upper cervical spine. Sinuses/Orbits: Visualized paranasal sinuses and mastoids are stable and well pneumatized. Other: Stable orbits soft tissues. Nonspecific  soft tissue gas at the right forehead and right temporal region. Additionally, there is scattered right masticator space gas which appears to be intravenous (series 4, image 22). No definite acute soft tissue injury. ASPECTS (Alberta Stroke Program Early CT Score) - Ganglionic level infarction (caudate, lentiform nuclei, internal capsule, insula, M1-M3 cortex): 7 - Supraganglionic infarction (M4-M6 cortex): 3 Total score (0-10 with 10 being normal): 10 IMPRESSION: 1. Stable CT appearance of the brain with no acute cortically based infarct or intracranial hemorrhage identified. ASPECTS is 10. 2. Gas in the right face appears to be within venous structures, and there is trace gas in the right cavernous sinus. This can be seen as a sequelae of recent intravenous access. 3. These results were communicated to Dr. Wilford Corner at 5:39 pmon 10/9/2019by text page via the [facility] messaging system. Electronically Signed   By: Odessa Fleming M.D.   On: 11/03/2017 17:39    Cardiac Studies   none  Patient Profile     84 y.o. male presented with altered mental status. He has chronic Afib and was felt to be bradycardic   Assessment & Plan    1. Atrial fibrillation. Currently rate controlled and not requiring pacing. I am still not convinced HR was ever really slow since available monitor strips show QRS complexes marching through the pacer spikes. Overnight rhythm is stable without pauses or bradycardia.  It is certainly possible he may have transient heart block. At this point recommend continuing to hold  metoprolol and diltiazem. Cardiac enzymes are negative. No further cardiac work up needed.  2. Mental status changes and facial droop. Neurology evaluating. Initial head CT negative for acute findings. Clinically much better today.  3. DVT on Xarelto. Recommend holding ASA while on Xarelto\ 4. CKD stage 3 - unchanged 5. Chronic anemia. Stable.  6. DM type 2 7. HTN [facility] will sign off.   Medication Recommendations:   Stop metoprolol and cardizem. Otherwise per [facility] Other recommendations (labs, testing, etc):  none Follow up as an outpatient:  PRN  For questions or updates, please contact [facility] Please consult [email or web address] for contact info under        Signed, Peter [place], MD  11/04/2017, 10:05 AM

## 2017-11-04 NOTE — Progress Notes (Signed)
2D Echocardiogram has been performed.  Pieter Partridge 11/04/2017, 2:54 PM

## 2017-11-04 NOTE — Progress Notes (Signed)
PROGRESS NOTE  [name] KGM:[id number] DOB: [birth year] DOA: 11/03/2017 PCP: Malka So., MD   LOS: 1 day   Brief Narrative / Interim history: 84 y.o. 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  Also bradycardic to 20s requiring pacing in ED. CT head without acute intracranial process.  Serial troponin and EKG without acute ischemic finding.  Subjective: No complaints this morning.  Alert oriented x4- date.  Denies headache, vision change, chest pain, dyspnea or palpitation.  Rate in 70s on monitor.   Assessment & Plan: Active Problems:   Essential hypertension   DM type 2 (diabetes mellitus, type 2) (HCC)   Symptomatic bradycardia   Facial droop   Left arm weakness  Altered mental status/facial droop/left arm weakness: resolved this morning.?TIA.?Polypharmacy.?Severe bradycardia> Neuro exam within normal except for bilateral leg weakness which appears to be chronic.  Patient is wheelchair-bound.  -Neurology following.  Wanted fulls neuro work-up given risk for TIA/ischemic CVA in the setting of severe bradycardia. -PT/OT/SLP eval -Discontinue D5-1/2NS until CVA is excluded.  Symptomatic bradycardia: Resolved.  Likely due to nodal blocking agents.  He is on BB and CCB -Cardiology on board.  Will continue holding BB and CCB.  History of diabetes.  A1c 5.6%. -Monitor CBG -Suggest discontinuing home meds on discharge.  Hypertension: Normotensive. -Continue holding home BP meds pending CVA work-up.  Chronic pain: On Norco 10/325 twice daily -Reduced to 5/325  History of DVT -Xarelto continued.  Other chronic conditions stable.  Continue home meds.  Scheduled Meds: . escitalopram  15 mg Oral Daily  . finasteride  5 mg Oral QHS  .  Influenza vac split quadrivalent PF  0.5 mL Intramuscular Tomorrow-1000  . insulin aspart  0-9 Units Subcutaneous TID WC  . levothyroxine  88 mcg Oral QAC breakfast  . rivaroxaban  20 mg Oral Q supper  . rOPINIRole  0.5 mg Oral QHS   Continuous Infusions:  PRN Meds:.acetaminophen **OR** acetaminophen, ALPRAZolam, HYDROcodone-acetaminophen, ondansetron **OR** ondansetron (ZOFRAN) IV  DVT prophylaxis: On Xarelto Code Status: Full code Family Communication: Bedside Disposition Plan: Transfer to telemetry  Consultants:   Neurology  Cardiology  Procedures:   None  Antimicrobials:  None  Objective: Vitals:   11/03/17 2045 11/03/17 2303 11/04/17 0329 11/04/17 0815  BP: 108/61 132/73 119/64 117/67  Pulse: (!) 58 77 78 77  Resp: 14 20 17 16   Temp:  98.2 F (36.8 C) 98.1 F (36.7 C) 97.9 F (36.6 C)  TempSrc:  Oral Oral Oral  SpO2: 97% 100% 100% 100%  Weight:  110.6 kg    Height:  5' 11.5" (1.816 m)      Intake/Output Summary (Last 24 hours) at 11/04/2017 1536 Last data filed at 11/04/2017 1500 Gross per 24 hour  Intake 818.29 ml  Output -  Net 818.29 ml   Filed Weights   11/03/17 2303  Weight: 110.6 kg    Examination:  GENERAL: appears well, no ditress HEENT: PERRLA, MMM NECK: Supple. LUNGS:  No IWOB, good air movement, CTAB HEART:  RRR with no M/R/G ABD:  Morbidly obese, soft, NT with active BS NEURO:  awake, alert and oriented x4.  Cranial nerves intact.  Motor intact except for bilateral lower extremity weakness (chronic).  Patient is wheelchair-bound.  Reflexes symmetric.  PSYCH:  calm. Normal affect   Data Reviewed: I have independently reviewed following labs and imaging studies  CBC: Recent Labs  Lab 11/03/17 1715 11/03/17 1721 11/03/17 1738  WBC 6.8  --   --   NEUTROABS 4.2  --   --   HGB 9.2* 9.5* 9.5*  HCT 30.5* 28.0* 28.0*  MCV 99.7  --   --   PLT 187  --   --    Basic Metabolic Panel: Recent Labs  Lab 11/03/17 1715  11/03/17 1721 11/03/17 1738 11/04/17 0521  NA 139 140 139 138  K 4.5 4.5 4.5 4.6  CL 106 105 106 106  CO2 24  --   --  24  GLUCOSE 116* 111* 110* 87  BUN 33* 33* 33* 31*  CREATININE 1.52* 1.50* 1.50* 1.40*  CALCIUM 9.1  --   --  8.6*   GFR: Estimated Creatinine Clearance: 49.2 mL/min (A) (by C-G formula based on SCr of 1.4 mg/dL (H)). Liver Function Tests: Recent Labs  Lab 11/03/17 1715  AST 19  ALT 13  ALKPHOS 82  BILITOT 0.6  PROT 6.2*  ALBUMIN 3.2*   No results for input(s): LIPASE, AMYLASE in the last 168 hours. No results for input(s): AMMONIA in the last 168 hours. Coagulation Profile: Recent Labs  Lab 11/03/17 1715  INR 2.44   Cardiac Enzymes: Recent Labs  Lab 11/03/17 2242 11/04/17 0521 11/04/17 0838  TROPONINI <0.03 <0.03 <0.03   BNP (last 3 results) No results for input(s): PROBNP in the last 8760 hours. HbA1C: Recent Labs    11/03/17 2242  HGBA1C 5.6   CBG: Recent Labs  Lab 11/04/17 0611 11/04/17 1105  GLUCAP 80 81   Lipid Profile: Recent Labs    11/04/17 0521  CHOL 125  HDL 33*  LDLCALC 84  TRIG 40  CHOLHDL 3.8   Thyroid Function Tests: Recent Labs    11/03/17 2242  TSH 3.601   Anemia Panel: No results for input(s): VITAMINB12, FOLATE, FERRITIN, TIBC, IRON, RETICCTPCT in the last 72 hours. Urine analysis:    Component Value Date/Time   COLORURINE YELLOW 11/03/2017 0341   APPEARANCEUR HAZY (A) 11/03/2017 0341   LABSPEC 1.012 11/03/2017 0341   PHURINE 8.0 11/03/2017 0341   GLUCOSEU NEGATIVE 11/03/2017 0341   HGBUR SMALL (A) 11/03/2017 0341   BILIRUBINUR NEGATIVE 11/03/2017 0341   KETONESUR NEGATIVE 11/03/2017 0341   PROTEINUR NEGATIVE 11/03/2017 0341   UROBILINOGEN 0.2 12/30/2012 2015   NITRITE NEGATIVE 11/03/2017 0341   LEUKOCYTESUR MODERATE (A) 11/03/2017 0341   Sepsis Labs: Invalid input(s): PROCALCITONIN, LACTICIDVEN  Recent Results (from the past 240 hour(s))  MRSA PCR Screening     Status: None    Collection Time: 11/03/17 10:42 PM  Result Value Ref Range Status   MRSA by PCR NEGATIVE NEGATIVE Final    Comment:        The GeneXpert MRSA Assay (FDA approved for NASAL specimens only), is one component of a comprehensive MRSA colonization surveillance program. It is not intended to diagnose MRSA infection nor to guide or monitor treatment for MRSA infections. Performed at [facility], [address], [place], [place] [address]       Radiology Studies: Ct Head Code Stroke Wo Contrast  Result Date: 11/03/2017 CLINICAL DATA:  Code stroke. 84 year old male with left facial droop and arm weakness. Last known normal 1515 hours. EXAM: CT HEAD WITHOUT CONTRAST TECHNIQUE: Contiguous axial images were obtained from the base of the skull through the vertex without intravenous  contrast. COMPARISON:  CT head 10/25/2017. FINDINGS: Brain: Stable cerebral volume with mild-to-moderate ventriculomegaly. No transependymal edema suspected. Patchy white matter hypodensity at the right posterior limb internal capsule and lentiform is stable. No acute intracranial hemorrhage, mass effect, or acute cortically based infarct. Vascular: Calcified atherosclerosis at the skull base. Trace gas in the right cavernous sinus. Chronic calcified right MCA atherosclerosis. No suspicious intracranial vascular hyperdensity. Skull: No acute osseous abnormality identified. Advanced degenerative changes in the visible upper cervical spine. Sinuses/Orbits: Visualized paranasal sinuses and mastoids are stable and well pneumatized. Other: Stable orbits soft tissues. Nonspecific soft tissue gas at the right forehead and right temporal region. Additionally, there is scattered right masticator space gas which appears to be intravenous (series 4, image 22). No definite acute soft tissue injury. ASPECTS (Alberta Stroke Program Early CT Score) - Ganglionic level infarction (caudate, lentiform nuclei, internal capsule, insula, M1-M3  cortex): 7 - Supraganglionic infarction (M4-M6 cortex): 3 Total score (0-10 with 10 being normal): 10 IMPRESSION: 1. Stable CT appearance of the brain with no acute cortically based infarct or intracranial hemorrhage identified. ASPECTS is 10. 2. Gas in the right face appears to be within venous structures, and there is trace gas in the right cavernous sinus. This can be seen as a sequelae of recent intravenous access. 3. These results were communicated to Dr. Wilford Corner at 5:39 pmon 10/9/2019by text page via the [facility] messaging system. Electronically Signed   By: Odessa Fleming M.D.   On: 11/03/2017 17:39      [facility] Pager [phone]  If 7PM-7AM, please contact night-coverage [email or web address] Password [email or web address] 11/04/2017, 3:36 PM

## 2017-11-04 NOTE — Progress Notes (Signed)
PT Cancellation Note  Patient Details Name: [name] MRN: [id number] DOB: 25-Mar-1930   Cancelled Treatment:    Reason Eval/Treat Not Completed: Patient at procedure or test/unavailable Pt currently out of room for testing. Will follow up as pt available and as schedule allows.   Gladys Damme, PT, DPT  Acute Rehabilitation Services  Pager: [phone] Office: [phone]    Lehman Prom 11/04/2017, 2:55 PM

## 2017-11-05 DIAGNOSIS — E1101 Type 2 diabetes mellitus with hyperosmolarity with coma: Secondary | ICD-10-CM | POA: Diagnosis not present

## 2017-11-05 DIAGNOSIS — G459 Transient cerebral ischemic attack, unspecified: Secondary | ICD-10-CM | POA: Diagnosis not present

## 2017-11-05 DIAGNOSIS — I10 Essential (primary) hypertension: Secondary | ICD-10-CM | POA: Diagnosis not present

## 2017-11-05 DIAGNOSIS — R001 Bradycardia, unspecified: Secondary | ICD-10-CM | POA: Diagnosis not present

## 2017-11-05 LAB — GLUCOSE, CAPILLARY
GLUCOSE-CAPILLARY: 96 mg/dL (ref 70–99)
Glucose-Capillary: 92 mg/dL (ref 70–99)
Glucose-Capillary: 112 mg/dL — ABNORMAL HIGH (ref 70–99)
Glucose-Capillary: 100 mg/dL — ABNORMAL HIGH (ref 70–99)

## 2017-11-05 NOTE — Progress Notes (Signed)
Physical Therapy Treatment Patient Details Name: [name] MRN: [id number] DOB: May 03, 1930 Today's Date: 11/05/2017    History of Present Illness Pt is an 84 y/o male admitted secondary to L arm weakness and L facial droop. Found to have symtpmatic bradycardia. CT of head negative for acute abnormality. PMH includes a fib, DVT, CKD, DM, and HTN.     PT Comments    Patient continues with good motivation and participation during session, stating, "I'm willing to try anything." Trialed multiple methods of transfers this session but unable to progress patient out of bed. Attempted Corene Cornea, but patient unable to clear hips from bed significantly and noted increased bilateral shoulder crepitus with attempts to pull up to stand. Do not think patient would be able to safely attempt slideboard transfer secondary to inability to laterally scoot up in the head of bed. Patient would make a great candidate for the Vitalgo tilt bed in order to safely progress bilateral lower extremity weightbearing; will consider for next session. Currently requiring two person maximal assistance for all aspects of bed mobility.  D/c plan remains appropriate.    Follow Up Recommendations  SNF;Supervision for mobility/OOB     Equipment Recommendations  None recommended by PT    Recommendations for Other Services       Precautions / Restrictions Precautions Precautions: Fall Restrictions Weight Bearing Restrictions: No    Mobility  Bed Mobility Overal bed mobility: Needs Assistance Bed Mobility: Rolling;Sidelying to Sit Rolling: Max assist;+2 for physical assistance Sidelying to sit: Max assist;+2 for physical assistance       General bed mobility comments: MaxA+2 rolling toward left, then maxA + 2 to bring BLE's off edge of bed and elevate trunk up to sitting  Transfers Overall transfer level: Needs assistance               General transfer comment: Attempted sit to stand with Stedy  but  patient unable to significantly clear hips. Noted crepitus in bilateral shoulders with pulling so discontinued attempt  Ambulation/Gait                 Stairs             Wheelchair Mobility    Modified Rankin (Stroke Patients Only)       Balance Overall balance assessment: Needs assistance Sitting-balance support: No upper extremity supported;Feet supported Sitting balance-Leahy Scale: Fair Sitting balance - Comments: Intermittent episodes of pt reporting feeling he was leaning posteriorly but was sitting upright. Supervision for safety                                    Cognition Arousal/Alertness: Awake/alert Behavior During Therapy: WFL for tasks assessed/performed Overall Cognitive Status: History of cognitive impairments - at baseline                                 General Comments: History of Alzheimer's disease. Patient describing something similar to aura, "white sun beside TV and down below bed," and does have history of migraines. However, then he states the building to his left was in [place]," when he was lying down but then "[place]" when he was sitting up.      Exercises      General Comments        Pertinent Vitals/Pain Pain Assessment: Faces Faces Pain Scale: No hurt  Home Living                      Prior Function            PT Goals (current goals can now be found in the care plan section) Acute Rehab PT Goals Patient Stated Goal: to be able to walk again  PT Goal Formulation: With patient Time For Goal Achievement: 11/18/17 Potential to Achieve Goals: Fair    Frequency    Min 2X/week      PT Plan Current plan remains appropriate    Co-evaluation              AM-PAC PT "6 Clicks" Daily Activity  Outcome Measure  Difficulty turning over in bed (including adjusting bedclothes, sheets and blankets)?: Unable Difficulty moving from lying on back to sitting on the side  of the bed? : Unable Difficulty sitting down on and standing up from a chair with arms (e.g., wheelchair, bedside commode, etc,.)?: Unable Help needed moving to and from a bed to chair (including a wheelchair)?: Total Help needed walking in hospital room?: Total Help needed climbing 3-5 steps with a railing? : Total 6 Click Score: 6    End of Session Equipment Utilized During Treatment: Gait belt Activity Tolerance: Patient tolerated treatment well Patient left: in bed;with call bell/phone within reach;with bed alarm set Nurse Communication: Mobility status PT Visit Diagnosis: Other abnormalities of gait and mobility (R26.89);Muscle weakness (generalized) (M62.81);Unsteadiness on feet (R26.81);Difficulty in walking, not elsewhere classified (R26.2)     Time: 1610-9604 PT Time Calculation (min) (ACUTE ONLY): 21 min  Charges:  $Therapeutic Activity: 8-22 mins                     Laurina Bustle, PT, DPT Acute Rehabilitation Services Pager [phone] Office [phone] }   Vanetta Mulders 11/05/2017, 3:30 PM

## 2017-11-05 NOTE — Care Management Obs Status (Signed)
MEDICARE OBSERVATION STATUS NOTIFICATION   Patient Details  Name: [name] MRN: [id number] Date of Birth: Aug 21, 1930   Medicare Observation Status Notification Given:  Yes    Darrold Span, RN 11/05/2017, 3:53 PM

## 2017-11-05 NOTE — Progress Notes (Signed)
Received report from RN; introduced staff and orient to room; Reviewed care plan, VSS; A/O; Pain 0/10; Bed low position; Call bell within reach; Will continue to monitor

## 2017-11-05 NOTE — Care Management CC44 (Signed)
Condition Code 44 Documentation Completed  Patient Details  Name: [name] MRN: [id number] Date of Birth: [birth year]   Condition Code 44 given:  Yes Patient signature on Condition Code 44 notice:  Yes Documentation of 2 MD's agreement:    Code 44 added to claim:  Yes    Darrold Span, RN 11/05/2017, 3:53 PM

## 2017-11-05 NOTE — Progress Notes (Signed)
PROGRESS NOTE    [name]  ZOX:[id number] DOB: 1930-02-15 DOA: 11/03/2017 PCP: Malka So., MD    Brief Narrative:  84 y.o.male,with past medical history significant for bradycardia s/p pacemaker in the past removed due to sepsis in 2017, history of Alzheimer's and DVTs on Xarelto brought to ED from [facility] as code stroke due to altered mental status,  facial drooping and left arm weakness.  Also bradycardic to 20s requiring pacing in ED. CT head without acute intracranial process.  Serial troponin and EKG without acute ischemic finding.  Assessment & Plan:   Active Problems:   Essential hypertension   DM type 2 (diabetes mellitus, type 2) (HCC)   Symptomatic bradycardia   Facial droop   Left arm weakness  Altered mental status/facial droop/left arm weakness:  -resolved  -Neurology had been following -MRI unremarkable for acute pathology. Neuro recommendation for continued Xarelto. Stop ASA per Cardiology -PT/OT/SLP eval  Symptomatic bradycardia: Resolved.  Likely due to nodal blocking agents.  He is on BB and CCB -Cardiology had been following, have stopped BB and CCB. -HR stable at present  History of diabetes.  A1c 5.6%. -Serial glucose stable at present, FSBS reviewed  Hypertension: Normotensive. -BP stable at present  Chronic pain: On Norco 10/325 twice daily prior to admit -Reduced to 5/325 -Stable at present  History of DVT -Xarelto continued.   DVT prophylaxis: Xarelto Code Status: Full Family Communication: Pt in room, family not at bedside Disposition Plan: SNF, timing uncertain  Consultants:   Neurology  Cardiology  Procedures:     Antimicrobials: Anti-infectives (From admission, onward)   None       Subjective: Without complaints this AM  Objective: Vitals:   11/04/17 2054 11/05/17 0050 11/05/17 0622 11/05/17 0900  BP: 130/68 113/61 110/62 139/74  Pulse:  (!) 117 81 94  Resp: 15 (!) 23 19 20   Temp: 99.1 F  (37.3 C) 97.8 F (36.6 C) 97.8 F (36.6 C) 99.2 F (37.3 C)  TempSrc: Oral Oral Oral Oral  SpO2: 98% 96% 100% 97%  Weight:      Height:        Intake/Output Summary (Last 24 hours) at 11/05/2017 1302 Last data filed at 11/05/2017 0800 Gross per 24 hour  Intake 789.97 ml  Output 1450 ml  Net -660.03 ml   Filed Weights   11/03/17 2303  Weight: 110.6 kg    Examination:  General exam: Appears calm and comfortable  Respiratory system: Clear to auscultation. Respiratory effort normal. Cardiovascular system: S1 & S2 heard, RRR Gastrointestinal system: Abdomen is nondistended, soft and nontender. No organomegaly or masses felt. Normal bowel sounds heard. Central nervous system: Alert and oriented. No focal neurological deficits. Extremities: Symmetric 5 x 5 power. Skin: No rashes, lesions  Psychiatry: Judgement and insight appear normal. Mood & affect appropriate.   Data Reviewed: I have personally reviewed following labs and imaging studies  CBC: Recent Labs  Lab 11/03/17 1715 11/03/17 1721 11/03/17 1738  WBC 6.8  --   --   NEUTROABS 4.2  --   --   HGB 9.2* 9.5* 9.5*  HCT 30.5* 28.0* 28.0*  MCV 99.7  --   --   PLT 187  --   --    Basic Metabolic Panel: Recent Labs  Lab 11/03/17 1715 11/03/17 1721 11/03/17 1738 11/04/17 0521  NA 139 140 139 138  K 4.5 4.5 4.5 4.6  CL 106 105 106 106  CO2 24  --   --  24  GLUCOSE 116* 111* 110* 87  BUN 33* 33* 33* 31*  CREATININE 1.52* 1.50* 1.50* 1.40*  CALCIUM 9.1  --   --  8.6*   GFR: Estimated Creatinine Clearance: 49.2 mL/min (A) (by C-G formula based on SCr of 1.4 mg/dL (H)). Liver Function Tests: Recent Labs  Lab 11/03/17 1715  AST 19  ALT 13  ALKPHOS 82  BILITOT 0.6  PROT 6.2*  ALBUMIN 3.2*   No results for input(s): LIPASE, AMYLASE in the last 168 hours. No results for input(s): AMMONIA in the last 168 hours. Coagulation Profile: Recent Labs  Lab 11/03/17 1715  INR 2.44   Cardiac Enzymes: Recent  Labs  Lab 11/03/17 2242 11/04/17 0521 11/04/17 0838  TROPONINI <0.03 <0.03 <0.03   BNP (last 3 results) No results for input(s): PROBNP in the last 8760 hours. HbA1C: Recent Labs    11/03/17 2242  HGBA1C 5.6   CBG: Recent Labs  Lab 11/04/17 1105 11/04/17 1610 11/04/17 2118 11/05/17 0617 11/05/17 1122  GLUCAP 81 90 79 92 112*   Lipid Profile: Recent Labs    11/04/17 0521  CHOL 125  HDL 33*  LDLCALC 84  TRIG 40  CHOLHDL 3.8   Thyroid Function Tests: Recent Labs    11/03/17 2242  TSH 3.601   Anemia Panel: No results for input(s): VITAMINB12, FOLATE, FERRITIN, TIBC, IRON, RETICCTPCT in the last 72 hours. Sepsis Labs: No results for input(s): PROCALCITON, LATICACIDVEN in the last 168 hours.  Recent Results (from the past 240 hour(s))  MRSA PCR Screening     Status: None   Collection Time: 11/03/17 10:42 PM  Result Value Ref Range Status   MRSA by PCR NEGATIVE NEGATIVE Final    Comment:        The GeneXpert MRSA Assay (FDA approved for NASAL specimens only), is one component of a comprehensive MRSA colonization surveillance program. It is not intended to diagnose MRSA infection nor to guide or monitor treatment for MRSA infections. Performed at [facility], [address] [place], [place] [address]      Radiology Studies: Mr Shirlee Latch UE Contrast  Result Date: 11/04/2017 CLINICAL DATA:  Initial evaluation for acute altered mental status, facial droop, left arm weakness. EXAM: MRI HEAD WITHOUT CONTRAST MRA HEAD WITHOUT CONTRAST TECHNIQUE: Multiplanar, multiecho pulse sequences of the brain and surrounding structures were obtained without intravenous contrast. Angiographic images of the head were obtained using MRA technique without contrast. COMPARISON:  Prior CT from 11/03/2017. FINDINGS: MRI HEAD FINDINGS Brain: Examination moderately degraded by motion artifact. Diffuse prominence of the CSF containing spaces compatible with generalized cerebral  atrophy. Probable mild chronic microvascular ischemic changes present within the periventricular white matter. No abnormal foci of restricted diffusion to suggest acute or subacute ischemia. Gray-white matter differentiation maintained. No areas of remote cortical infarction. No acute or chronic intracranial hemorrhage. No mass lesion, midline shift or mass effect. Diffuse ventricular prominence related global parenchymal volume loss of hydrocephalus. No extra-axial fluid collection. Pituitary gland grossly normal. Vascular: Major intracranial vascular flow voids are maintained. Skull and upper cervical spine: Craniocervical junction normal. No focal marrow replacing lesion. Scalp soft tissues unremarkable. Sinuses/Orbits: Globes and orbital soft tissues within normal limits. Patient status post ocular lens replacement bilaterally. Mild scattered mucosal thickening throughout the ethmoidal air cells and maxillary sinuses. Paranasal sinuses are otherwise clear. Trace opacity bilateral mastoid air cells, of doubtful significance. Inner ear structures grossly normal. Other: None. MRA HEAD FINDINGS ANTERIOR CIRCULATION: Examination moderately degraded by  motion artifact. Distal cervical segments of the internal carotid arteries are widely patent with antegrade flow. Petrous segments widely patent bilaterally. Atheromatous irregularity within the cavernous/supraclinoid ICAs without high-grade stenosis. A1 segments patent bilaterally. Anterior communicating artery not well assessed due to motion. Anterior cerebral arteries patent distally without obvious stenosis. M1 segments patent bilaterally. Normal MCA bifurcations. No appreciable proximal M2 occlusion distal MCA branches well perfused and symmetric. POSTERIOR CIRCULATION: Scattered atheromatous irregularity within the vertebral arteries bilaterally without high-grade stenosis. Patent right PICA. Left PICA not visualized. Basilar widely patent to its distal aspect.  Superior cerebral arteries patent bilaterally. Both of the PCA supplied via the basilar. Scattered atheromatous irregularity throughout the visualized proximal and mid PCAs. PCAs not well assessed distally due to motion. No obvious intracranial aneurysm. IMPRESSION: MRI HEAD IMPRESSION: 1. Limited examination due to extensive motion artifact. 2. No acute intracranial infarct or other abnormality identified. 3. Moderately advanced cerebral atrophy with mild chronic small vessel ischemic disease. MRA HEAD IMPRESSION: 1. Limited examination due to extensive motion artifact. 2. Negative intracranial MRA for large vessel occlusion. Moderate intracranial atherosclerotic change without hemodynamically significant or correctable stenosis. Electronically Signed   By: Rise Mu M.D.   On: 11/04/2017 22:11   Mr Brain Wo Contrast  Result Date: 11/04/2017 CLINICAL DATA:  Initial evaluation for acute altered mental status, facial droop, left arm weakness. EXAM: MRI HEAD WITHOUT CONTRAST MRA HEAD WITHOUT CONTRAST TECHNIQUE: Multiplanar, multiecho pulse sequences of the brain and surrounding structures were obtained without intravenous contrast. Angiographic images of the head were obtained using MRA technique without contrast. COMPARISON:  Prior CT from 11/03/2017. FINDINGS: MRI HEAD FINDINGS Brain: Examination moderately degraded by motion artifact. Diffuse prominence of the CSF containing spaces compatible with generalized cerebral atrophy. Probable mild chronic microvascular ischemic changes present within the periventricular white matter. No abnormal foci of restricted diffusion to suggest acute or subacute ischemia. Gray-white matter differentiation maintained. No areas of remote cortical infarction. No acute or chronic intracranial hemorrhage. No mass lesion, midline shift or mass effect. Diffuse ventricular prominence related global parenchymal volume loss of hydrocephalus. No extra-axial fluid collection.  Pituitary gland grossly normal. Vascular: Major intracranial vascular flow voids are maintained. Skull and upper cervical spine: Craniocervical junction normal. No focal marrow replacing lesion. Scalp soft tissues unremarkable. Sinuses/Orbits: Globes and orbital soft tissues within normal limits. Patient status post ocular lens replacement bilaterally. Mild scattered mucosal thickening throughout the ethmoidal air cells and maxillary sinuses. Paranasal sinuses are otherwise clear. Trace opacity bilateral mastoid air cells, of doubtful significance. Inner ear structures grossly normal. Other: None. MRA HEAD FINDINGS ANTERIOR CIRCULATION: Examination moderately degraded by motion artifact. Distal cervical segments of the internal carotid arteries are widely patent with antegrade flow. Petrous segments widely patent bilaterally. Atheromatous irregularity within the cavernous/supraclinoid ICAs without high-grade stenosis. A1 segments patent bilaterally. Anterior communicating artery not well assessed due to motion. Anterior cerebral arteries patent distally without obvious stenosis. M1 segments patent bilaterally. Normal MCA bifurcations. No appreciable proximal M2 occlusion distal MCA branches well perfused and symmetric. POSTERIOR CIRCULATION: Scattered atheromatous irregularity within the vertebral arteries bilaterally without high-grade stenosis. Patent right PICA. Left PICA not visualized. Basilar widely patent to its distal aspect. Superior cerebral arteries patent bilaterally. Both of the PCA supplied via the basilar. Scattered atheromatous irregularity throughout the visualized proximal and mid PCAs. PCAs not well assessed distally due to motion. No obvious intracranial aneurysm. IMPRESSION: MRI HEAD IMPRESSION: 1. Limited examination due to extensive motion artifact. 2. No acute intracranial infarct or  other abnormality identified. 3. Moderately advanced cerebral atrophy with mild chronic small vessel ischemic  disease. MRA HEAD IMPRESSION: 1. Limited examination due to extensive motion artifact. 2. Negative intracranial MRA for large vessel occlusion. Moderate intracranial atherosclerotic change without hemodynamically significant or correctable stenosis. Electronically Signed   By: Rise Mu M.D.   On: 11/04/2017 22:11   Ct Head Code Stroke Wo Contrast  Result Date: 11/03/2017 CLINICAL DATA:  Code stroke. 84 year old male with left facial droop and arm weakness. Last known normal 1515 hours. EXAM: CT HEAD WITHOUT CONTRAST TECHNIQUE: Contiguous axial images were obtained from the base of the skull through the vertex without intravenous contrast. COMPARISON:  CT head 10/25/2017. FINDINGS: Brain: Stable cerebral volume with mild-to-moderate ventriculomegaly. No transependymal edema suspected. Patchy white matter hypodensity at the right posterior limb internal capsule and lentiform is stable. No acute intracranial hemorrhage, mass effect, or acute cortically based infarct. Vascular: Calcified atherosclerosis at the skull base. Trace gas in the right cavernous sinus. Chronic calcified right MCA atherosclerosis. No suspicious intracranial vascular hyperdensity. Skull: No acute osseous abnormality identified. Advanced degenerative changes in the visible upper cervical spine. Sinuses/Orbits: Visualized paranasal sinuses and mastoids are stable and well pneumatized. Other: Stable orbits soft tissues. Nonspecific soft tissue gas at the right forehead and right temporal region. Additionally, there is scattered right masticator space gas which appears to be intravenous (series 4, image 22). No definite acute soft tissue injury. ASPECTS (Alberta Stroke Program Early CT Score) - Ganglionic level infarction (caudate, lentiform nuclei, internal capsule, insula, M1-M3 cortex): 7 - Supraganglionic infarction (M4-M6 cortex): 3 Total score (0-10 with 10 being normal): 10 IMPRESSION: 1. Stable CT appearance of the brain with  no acute cortically based infarct or intracranial hemorrhage identified. ASPECTS is 10. 2. Gas in the right face appears to be within venous structures, and there is trace gas in the right cavernous sinus. This can be seen as a sequelae of recent intravenous access. 3. These results were communicated to Dr. Wilford Corner at 5:39 pmon 10/9/2019by text page via the [facility] messaging system. Electronically Signed   By: Odessa Fleming M.D.   On: 11/03/2017 17:39    Scheduled Meds: . escitalopram  15 mg Oral Daily  . finasteride  5 mg Oral QHS  . insulin aspart  0-9 Units Subcutaneous TID WC  . levothyroxine  88 mcg Oral QAC breakfast  . rivaroxaban  20 mg Oral Q supper  . rOPINIRole  0.5 mg Oral QHS   Continuous Infusions:   LOS: 2 days   Rickey Barbara, MD [facility] Pager On Amion  If 7PM-7AM, please contact night-coverage 11/05/2017, 1:02 PM

## 2017-11-05 NOTE — Progress Notes (Signed)
Chart review note  MRI and MRA brain unremarkable for acute pathology or correctable stenosis/occlusion in cerebral vasculature. Carotid dopplers with b/l 1-39% stenosis and anterograde flow in verts.   No new recs C/W Xarelto per cards. Will defer ASA to cards.  -- Milon Dikes, MD [facility] Pager: [phone] If 7pm to 7am, please call on call as listed on AMION.

## 2017-11-05 NOTE — Progress Notes (Signed)
Clinical Social Worker following patient for support and discharge needs. CSW spoke to admission coordinator for [facility] and they stated they still have not yet received authorization through patients insurance. CSW is awaiting auth before patient can discharge.   Marrianne Mood, MSW,  [employer] [phone]

## 2017-11-06 DIAGNOSIS — R4182 Altered mental status, unspecified: Secondary | ICD-10-CM | POA: Diagnosis not present

## 2017-11-06 DIAGNOSIS — R29898 Other symptoms and signs involving the musculoskeletal system: Secondary | ICD-10-CM | POA: Diagnosis not present

## 2017-11-06 DIAGNOSIS — I10 Essential (primary) hypertension: Secondary | ICD-10-CM | POA: Diagnosis not present

## 2017-11-06 DIAGNOSIS — G459 Transient cerebral ischemic attack, unspecified: Secondary | ICD-10-CM | POA: Diagnosis not present

## 2017-11-06 DIAGNOSIS — R001 Bradycardia, unspecified: Secondary | ICD-10-CM | POA: Diagnosis not present

## 2017-11-06 LAB — GLUCOSE, CAPILLARY
GLUCOSE-CAPILLARY: 90 mg/dL (ref 70–99)
GLUCOSE-CAPILLARY: 80 mg/dL (ref 70–99)
Glucose-Capillary: 83 mg/dL (ref 70–99)
Glucose-Capillary: 114 mg/dL — ABNORMAL HIGH (ref 70–99)

## 2017-11-06 NOTE — Progress Notes (Signed)
Received report from RN; Pt was introduced staff and orient to room; Reviewed care plan, VSS; A/O; Pain 0/10; Bed low position and alarm on; Reposition Pt; Call bell within reach; Will continue to monitor

## 2017-11-06 NOTE — Progress Notes (Signed)
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  Also bradycardic to 20s requiring pacing in ED. CT head without acute intracranial process.  Serial troponin and EKG without acute ischemic finding.  Assessment & Plan:   Active Problems:   Essential hypertension   DM type 2 (diabetes mellitus, type 2) (HCC)   Symptomatic bradycardia   Facial droop   Left arm weakness  Altered mental status/facial droop/left arm weakness:  -resolved  -Neurology had been following -MRI unremarkable for acute pathology. Neuro recommendation for continued Xarelto. Stop ASA per Cardiology -PT/OT/SLP eval with recs for SNF, placement in progress  Symptomatic bradycardia: Resolved.  Likely due to nodal blocking agents.  He is on BB and CCB -Cardiology had been following, have stopped BB and CCB. -HR stable at present  History of diabetes.  A1c 5.6%. -Serial glucose stable at present, FSBS reviewed  Hypertension: Normotensive. -BP stable at present  Chronic pain: On Norco 10/325 twice daily prior to admit -Reduced to 5/325 -Stable at present  History of DVT -Xarelto continued.   DVT prophylaxis: Xarelto Code Status: Full Family Communication: Pt in room, family not at bedside Disposition Plan: SNF, timing uncertain  Consultants:   Neurology  Cardiology  Procedures:     Antimicrobials: Anti-infectives (From admission, onward)   None      Subjective: No complaints  Objective: Vitals:   11/05/17 2027 11/05/17 2358 11/06/17 0421 11/06/17 1131  BP: 124/74 122/71 (!) 149/97 133/89  Pulse: 70 79 85 80  Resp: 20 18 17  14   Temp: 98.2 F (36.8 C) 98.5 F (36.9 C) 99.3 F (37.4 C) 98 F (36.7 C)  TempSrc: Oral Oral Oral Oral  SpO2: 98% 97% 98% 90%  Weight:      Height:        Intake/Output Summary (Last 24 hours) at 11/06/2017 1506 Last data filed at 11/06/2017 8119 Gross per 24 hour  Intake 720 ml  Output 650 ml  Net 70 ml   Filed Weights   11/03/17 2303  Weight: 110.6 kg    Examination: General exam: Conversant, in no acute distress Respiratory system: normal chest rise, clear, no audible wheezing  Data Reviewed: I have personally reviewed following labs and imaging studies  CBC: Recent Labs  Lab 11/03/17 1715 11/03/17 1721 11/03/17 1738  WBC 6.8  --   --   NEUTROABS 4.2  --   --   HGB 9.2* 9.5* 9.5*  HCT 30.5* 28.0* 28.0*  MCV 99.7  --   --   PLT 187  --   --    Basic Metabolic Panel: Recent Labs  Lab 11/03/17 1715 11/03/17 1721 11/03/17 1738 11/04/17 0521  NA 139 140 139 138  K 4.5 4.5 4.5 4.6  CL 106 105 106 106  CO2 24  --   --  24  GLUCOSE 116* 111* 110* 87  BUN 33* 33* 33* 31*  CREATININE 1.52* 1.50* 1.50* 1.40*  CALCIUM 9.1  --   --  8.6*   GFR: Estimated Creatinine Clearance: 49.2 mL/min (A) (by C-G formula based on SCr of 1.4 mg/dL (H)). Liver Function Tests: Recent Labs  Lab 11/03/17 1715  AST 19  ALT 13  ALKPHOS 82  BILITOT 0.6  PROT 6.2*  ALBUMIN 3.2*   No results for input(s): LIPASE, AMYLASE in the last 168 hours. No results for input(s): AMMONIA in the last 168 hours. Coagulation Profile: Recent Labs  Lab 11/03/17 1715  INR 2.44   Cardiac Enzymes: Recent Labs  Lab 11/03/17 2242 11/04/17 0521 11/04/17 0838  TROPONINI <0.03 <0.03 <0.03   BNP (last 3 results) No results for input(s): PROBNP in the last 8760 hours. HbA1C: Recent Labs    11/03/17 2242  HGBA1C 5.6   CBG: Recent Labs  Lab 11/05/17 1122 11/05/17 1633 11/05/17 2119 11/06/17 0607 11/06/17 1127  GLUCAP 112* 96 100* 90 83   Lipid Profile: Recent Labs     11/04/17 0521  CHOL 125  HDL 33*  LDLCALC 84  TRIG 40  CHOLHDL 3.8   Thyroid Function Tests: Recent Labs    11/03/17 2242  TSH 3.601   Anemia Panel: No results for input(s): VITAMINB12, FOLATE, FERRITIN, TIBC, IRON, RETICCTPCT in the last 72 hours. Sepsis Labs: No results for input(s): PROCALCITON, LATICACIDVEN in the last 168 hours.  Recent Results (from the past 240 hour(s))  MRSA PCR Screening     Status: None   Collection Time: 11/03/17 10:42 PM  Result Value Ref Range Status   MRSA by PCR NEGATIVE NEGATIVE Final    Comment:        The GeneXpert MRSA Assay (FDA approved for NASAL specimens only), is one component of a comprehensive MRSA colonization surveillance program. It is not intended to diagnose MRSA infection nor to guide or monitor treatment for MRSA infections. Performed at [facility], [address], [place], [place] [address]      Radiology Studies: Mr Shirlee Latch XB Contrast  Result Date: 11/04/2017 CLINICAL DATA:  Initial evaluation for acute altered mental status, facial droop, left arm weakness. EXAM: MRI HEAD WITHOUT CONTRAST MRA HEAD WITHOUT CONTRAST TECHNIQUE: Multiplanar, multiecho pulse sequences of the brain and surrounding structures were obtained without intravenous contrast. Angiographic images of the head were obtained using MRA technique without contrast. COMPARISON:  Prior CT from 11/03/2017. FINDINGS: MRI HEAD FINDINGS Brain: Examination moderately degraded by motion artifact. Diffuse prominence of the CSF containing spaces compatible with generalized cerebral atrophy. Probable mild chronic microvascular ischemic changes present within the periventricular white matter. No abnormal foci of restricted diffusion to suggest acute or subacute ischemia. Gray-white matter differentiation maintained. No areas of remote cortical infarction. No acute or chronic intracranial hemorrhage. No mass lesion, midline shift or mass effect. Diffuse  ventricular prominence related global parenchymal volume loss of hydrocephalus. No extra-axial fluid collection. Pituitary gland grossly normal. Vascular: Major intracranial vascular flow voids are maintained. Skull and upper cervical spine: Craniocervical junction normal. No focal marrow replacing lesion. Scalp soft tissues unremarkable. Sinuses/Orbits: Globes and orbital soft tissues within normal limits. Patient status post ocular lens replacement bilaterally. Mild scattered mucosal thickening throughout the ethmoidal air cells and maxillary sinuses. Paranasal sinuses are otherwise clear. Trace opacity bilateral mastoid air cells, of doubtful significance. Inner ear structures grossly normal. Other: None. MRA HEAD FINDINGS ANTERIOR CIRCULATION: Examination moderately degraded by motion artifact. Distal cervical segments of the internal carotid arteries are widely patent with antegrade flow. Petrous segments widely patent bilaterally. Atheromatous irregularity within the cavernous/supraclinoid ICAs without high-grade stenosis. A1 segments patent bilaterally. Anterior communicating artery not well assessed due to motion. Anterior cerebral arteries patent distally without obvious stenosis. M1 segments patent  bilaterally. Normal MCA bifurcations. No appreciable proximal M2 occlusion distal MCA branches well perfused and symmetric. POSTERIOR CIRCULATION: Scattered atheromatous irregularity within the vertebral arteries bilaterally without high-grade stenosis. Patent right PICA. Left PICA not visualized. Basilar widely patent to its distal aspect. Superior cerebral arteries patent bilaterally. Both of the PCA supplied via the basilar. Scattered atheromatous irregularity throughout the visualized proximal and mid PCAs. PCAs not well assessed distally due to motion. No obvious intracranial aneurysm. IMPRESSION: MRI HEAD IMPRESSION: 1. Limited examination due to extensive motion artifact. 2. No acute intracranial infarct  or other abnormality identified. 3. Moderately advanced cerebral atrophy with mild chronic small vessel ischemic disease. MRA HEAD IMPRESSION: 1. Limited examination due to extensive motion artifact. 2. Negative intracranial MRA for large vessel occlusion. Moderate intracranial atherosclerotic change without hemodynamically significant or correctable stenosis. Electronically Signed   By: Rise Mu M.D.   On: 11/04/2017 22:11   Mr Brain Wo Contrast  Result Date: 11/04/2017 CLINICAL DATA:  Initial evaluation for acute altered mental status, facial droop, left arm weakness. EXAM: MRI HEAD WITHOUT CONTRAST MRA HEAD WITHOUT CONTRAST TECHNIQUE: Multiplanar, multiecho pulse sequences of the brain and surrounding structures were obtained without intravenous contrast. Angiographic images of the head were obtained using MRA technique without contrast. COMPARISON:  Prior CT from 11/03/2017. FINDINGS: MRI HEAD FINDINGS Brain: Examination moderately degraded by motion artifact. Diffuse prominence of the CSF containing spaces compatible with generalized cerebral atrophy. Probable mild chronic microvascular ischemic changes present within the periventricular white matter. No abnormal foci of restricted diffusion to suggest acute or subacute ischemia. Gray-white matter differentiation maintained. No areas of remote cortical infarction. No acute or chronic intracranial hemorrhage. No mass lesion, midline shift or mass effect. Diffuse ventricular prominence related global parenchymal volume loss of hydrocephalus. No extra-axial fluid collection. Pituitary gland grossly normal. Vascular: Major intracranial vascular flow voids are maintained. Skull and upper cervical spine: Craniocervical junction normal. No focal marrow replacing lesion. Scalp soft tissues unremarkable. Sinuses/Orbits: Globes and orbital soft tissues within normal limits. Patient status post ocular lens replacement bilaterally. Mild scattered mucosal  thickening throughout the ethmoidal air cells and maxillary sinuses. Paranasal sinuses are otherwise clear. Trace opacity bilateral mastoid air cells, of doubtful significance. Inner ear structures grossly normal. Other: None. MRA HEAD FINDINGS ANTERIOR CIRCULATION: Examination moderately degraded by motion artifact. Distal cervical segments of the internal carotid arteries are widely patent with antegrade flow. Petrous segments widely patent bilaterally. Atheromatous irregularity within the cavernous/supraclinoid ICAs without high-grade stenosis. A1 segments patent bilaterally. Anterior communicating artery not well assessed due to motion. Anterior cerebral arteries patent distally without obvious stenosis. M1 segments patent bilaterally. Normal MCA bifurcations. No appreciable proximal M2 occlusion distal MCA branches well perfused and symmetric. POSTERIOR CIRCULATION: Scattered atheromatous irregularity within the vertebral arteries bilaterally without high-grade stenosis. Patent right PICA. Left PICA not visualized. Basilar widely patent to its distal aspect. Superior cerebral arteries patent bilaterally. Both of the PCA supplied via the basilar. Scattered atheromatous irregularity throughout the visualized proximal and mid PCAs. PCAs not well assessed distally due to motion. No obvious intracranial aneurysm. IMPRESSION: MRI HEAD IMPRESSION: 1. Limited examination due to extensive motion artifact. 2. No acute intracranial infarct or other abnormality identified. 3. Moderately advanced cerebral atrophy with mild chronic small vessel ischemic disease. MRA HEAD IMPRESSION: 1. Limited examination due to extensive motion artifact. 2. Negative intracranial MRA for large vessel occlusion. Moderate intracranial atherosclerotic change without hemodynamically significant or correctable stenosis. Electronically Signed   By: Janell Quiet.D.  On: 11/04/2017 22:11    Scheduled Meds: . escitalopram  15 mg Oral  Daily  . finasteride  5 mg Oral QHS  . insulin aspart  0-9 Units Subcutaneous TID WC  . levothyroxine  88 mcg Oral QAC breakfast  . rivaroxaban  20 mg Oral Q supper  . rOPINIRole  0.5 mg Oral QHS   Continuous Infusions:   LOS: 2 days   Rickey Barbara, MD [facility] Pager On Amion  If 7PM-7AM, please contact night-coverage 11/06/2017, 3:06 PM

## 2017-11-06 NOTE — Progress Notes (Signed)
Contacted [facility]. Authorization still pending for placement. Will call CSW once attained. Will continue to assist as needed.

## 2017-11-07 DIAGNOSIS — R29810 Facial weakness: Secondary | ICD-10-CM | POA: Diagnosis not present

## 2017-11-07 DIAGNOSIS — I10 Essential (primary) hypertension: Secondary | ICD-10-CM | POA: Diagnosis not present

## 2017-11-07 DIAGNOSIS — G459 Transient cerebral ischemic attack, unspecified: Secondary | ICD-10-CM | POA: Diagnosis not present

## 2017-11-07 LAB — GLUCOSE, CAPILLARY
GLUCOSE-CAPILLARY: 92 mg/dL (ref 70–99)
GLUCOSE-CAPILLARY: 108 mg/dL — AB (ref 70–99)
GLUCOSE-CAPILLARY: 109 mg/dL — AB (ref 70–99)
Glucose-Capillary: 85 mg/dL (ref 70–99)

## 2017-11-07 NOTE — Progress Notes (Signed)
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  Also bradycardic to 20s requiring pacing in ED. CT head without acute intracranial process.  Serial troponin and EKG without acute ischemic finding.  Assessment & Plan:   Active Problems:   Essential hypertension   DM type 2 (diabetes mellitus, type 2) (HCC)   Symptomatic bradycardia   Facial droop   Left arm weakness  Altered mental status/facial droop/left arm weakness:  -resolved  -Neurology had been following -MRI unremarkable for acute pathology. Neuro recommendation for continued Xarelto. Stop ASA per Cardiology -PT/OT/SLP eval with recs for SNF, placement in progress  Symptomatic bradycardia: Resolved.  Likely due to nodal blocking agents.  He is on BB and CCB -Cardiology had been following, have stopped BB and CCB. -HR stable at present  History of diabetes.  A1c 5.6%. -Serial glucose stable at present, FSBS reviewed  Hypertension: Normotensive. -BP stable at present  Chronic pain: On Norco 10/325 twice daily prior to admit -Reduced to 5/325 -Stable at present  History of DVT -Xarelto continued.   DVT prophylaxis: Xarelto Code Status: Full Family Communication: Pt in room, family not at bedside Disposition Plan: SNF, timing uncertain  Consultants:   Neurology  Cardiology  Procedures:     Antimicrobials: Anti-infectives (From admission, onward)   None      Subjective: Without complaints this AM  Objective: Vitals:   11/07/17 0404 11/07/17 0750 11/07/17 1146 11/07/17 1623  BP: 135/76 139/72 122/78 132/64  Pulse: 89  79 66  Resp:  20 18 17 16   Temp: 98.6 F (37 C) 98 F (36.7 C) 98 F (36.7 C) 98 F (36.7 C)  TempSrc: Oral Oral Oral Oral  SpO2: 100% 100% 98% 98%  Weight:      Height:        Intake/Output Summary (Last 24 hours) at 11/07/2017 1648 Last data filed at 11/07/2017 1146 Gross per 24 hour  Intake 720 ml  Output 900 ml  Net -180 ml   Filed Weights   11/03/17 2303  Weight: 110.6 kg    Examination: General exam: Awake, laying in bed, in nad Respiratory system: Normal respiratory effort, no wheezing  Data Reviewed: I have personally reviewed following labs and imaging studies  CBC: Recent Labs  Lab 11/03/17 1715 11/03/17 1721 11/03/17 1738  WBC 6.8  --   --   NEUTROABS 4.2  --   --   HGB 9.2* 9.5* 9.5*  HCT 30.5* 28.0* 28.0*  MCV 99.7  --   --   PLT 187  --   --    Basic Metabolic Panel: Recent Labs  Lab 11/03/17 1715 11/03/17 1721 11/03/17 1738 11/04/17 0521  NA 139 140 139 138  K 4.5 4.5 4.5 4.6  CL 106 105 106 106  CO2 24  --   --  24  GLUCOSE 116* 111* 110* 87  BUN 33* 33* 33* 31*  CREATININE 1.52* 1.50* 1.50* 1.40*  CALCIUM 9.1  --   --  8.6*   GFR: Estimated Creatinine Clearance: 49.2 mL/min (A) (by C-G formula based on SCr of 1.4 mg/dL (H)). Liver Function Tests: Recent Labs  Lab 11/03/17 1715  AST 19  ALT 13  ALKPHOS 82  BILITOT 0.6  PROT 6.2*  ALBUMIN 3.2*   No results for input(s): LIPASE, AMYLASE in the last 168 hours. No results for input(s): AMMONIA in the last 168 hours. Coagulation Profile: Recent Labs  Lab 11/03/17 1715  INR 2.44   Cardiac Enzymes: Recent Labs  Lab 11/03/17 2242 11/04/17 0521 11/04/17 0838  TROPONINI <0.03 <0.03 <0.03   BNP (last 3 results) No results for input(s): PROBNP in the last 8760 hours. HbA1C: No results for input(s): HGBA1C in the last 72 hours. CBG: Recent Labs  Lab 11/06/17 1634 11/06/17 2324 11/07/17 0635 11/07/17 1144 11/07/17 1619  GLUCAP 114* 80 92 108* 85   Lipid Profile: No results for  input(s): CHOL, HDL, LDLCALC, TRIG, CHOLHDL, LDLDIRECT in the last 72 hours. Thyroid Function Tests: No results for input(s): TSH, T4TOTAL, FREET4, T3FREE, THYROIDAB in the last 72 hours. Anemia Panel: No results for input(s): VITAMINB12, FOLATE, FERRITIN, TIBC, IRON, RETICCTPCT in the last 72 hours. Sepsis Labs: No results for input(s): PROCALCITON, LATICACIDVEN in the last 168 hours.  Recent Results (from the past 240 hour(s))  MRSA PCR Screening     Status: None   Collection Time: 11/03/17 10:42 PM  Result Value Ref Range Status   MRSA by PCR NEGATIVE NEGATIVE Final    Comment:        The GeneXpert MRSA Assay (FDA approved for NASAL specimens only), is one component of a comprehensive MRSA colonization surveillance program. It is not intended to diagnose MRSA infection nor to guide or monitor treatment for MRSA infections. Performed at [facility], [address] [place], [place] [address]      Radiology Studies: No results found.  Scheduled Meds: . escitalopram  15 mg Oral Daily  . finasteride  5 mg Oral QHS  . insulin aspart  0-9 Units Subcutaneous TID WC  . levothyroxine  88 mcg Oral QAC breakfast  . rivaroxaban  20 mg Oral Q supper  . rOPINIRole  0.5 mg Oral QHS   Continuous Infusions:   LOS: 2 days   Rickey Barbara, MD [facility] Pager On Amion  If 7PM-7AM, please contact night-coverage 11/07/2017, 4:48 PM

## 2017-11-08 DIAGNOSIS — G459 Transient cerebral ischemic attack, unspecified: Secondary | ICD-10-CM | POA: Diagnosis not present

## 2017-11-08 DIAGNOSIS — R001 Bradycardia, unspecified: Secondary | ICD-10-CM | POA: Diagnosis not present

## 2017-11-08 DIAGNOSIS — E1101 Type 2 diabetes mellitus with hyperosmolarity with coma: Secondary | ICD-10-CM | POA: Diagnosis not present

## 2017-11-08 DIAGNOSIS — I10 Essential (primary) hypertension: Secondary | ICD-10-CM | POA: Diagnosis not present

## 2017-11-08 LAB — GLUCOSE, CAPILLARY
GLUCOSE-CAPILLARY: 93 mg/dL (ref 70–99)
Glucose-Capillary: 83 mg/dL (ref 70–99)
Glucose-Capillary: 73 mg/dL (ref 70–99)
Glucose-Capillary: 83 mg/dL (ref 70–99)

## 2017-11-08 NOTE — Progress Notes (Signed)
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  Also bradycardic to 20s requiring pacing in ED. CT head without acute intracranial process.  Serial troponin and EKG without acute ischemic finding.  Assessment & Plan:   Active Problems:   Essential hypertension   DM type 2 (diabetes mellitus, type 2) (HCC)   Symptomatic bradycardia   Facial droop   Left arm weakness  Altered mental status/facial droop/left arm weakness:  -resolved  -Neurology had been following -MRI unremarkable for acute pathology. Neuro recommendation for continued Xarelto. Stop ASA per Cardiology -PT/OT/SLP eval with recs for SNF, insurance clearance progress  Symptomatic bradycardia: Resolved.  Likely due to nodal blocking agents.  He is on BB and CCB -Cardiology had been following, have stopped BB and CCB. -HR stable at present  History of diabetes.  A1c 5.6%. -Serial glucose stable at present, FSBS reviewed  Hypertension: Normotensive. -BP stable at present  Chronic pain: On Norco 10/325 twice daily prior to admit -Reduced to 5/325 -Stable at present  History of DVT -Xarelto continued.   DVT prophylaxis: Xarelto Code Status: Full Family Communication: Pt in room, family not at bedside Disposition Plan: SNF, timing uncertain  Consultants:   Neurology  Cardiology  Procedures:     Antimicrobials: Anti-infectives (From admission, onward)   None      Subjective: No complaints this  AM  Objective: Vitals:   11/08/17 0459 11/08/17 0908 11/08/17 1109 11/08/17 1618  BP: 136/75 (!) 145/92 (!) 148/81 (!) 150/80  Pulse: 72  71 85 94  Resp: 18 17 (!) 23 17  Temp: 99.4 F (37.4 C) 98.4 F (36.9 C) 98 F (36.7 C) 97.8 F (36.6 C)  TempSrc: Oral Oral Oral Oral  SpO2: 94% 95% 100% 98%  Weight:      Height:        Intake/Output Summary (Last 24 hours) at 11/08/2017 1627 Last data filed at 11/08/2017 1620 Gross per 24 hour  Intake 240 ml  Output 1225 ml  Net -985 ml   Filed Weights   11/03/17 2303  Weight: 110.6 kg    Examination: General exam: Conversant, in no acute distress Respiratory system: normal chest rise, clear, no audible wheezing  Data Reviewed: I have personally reviewed following labs and imaging studies  CBC: Recent Labs  Lab 11/03/17 1715 11/03/17 1721 11/03/17 1738  WBC 6.8  --   --   NEUTROABS 4.2  --   --   HGB 9.2* 9.5* 9.5*  HCT 30.5* 28.0* 28.0*  MCV 99.7  --   --   PLT 187  --   --    Basic Metabolic Panel: Recent Labs  Lab 11/03/17 1715 11/03/17 1721 11/03/17 1738 11/04/17 0521  NA 139 140 139 138  K 4.5 4.5 4.5 4.6  CL 106 105 106 106  CO2 24  --   --  24  GLUCOSE 116* 111* 110* 87  BUN 33* 33* 33* 31*  CREATININE 1.52* 1.50* 1.50* 1.40*  CALCIUM 9.1  --   --  8.6*   GFR: Estimated Creatinine Clearance: 49.2 mL/min (A) (by C-G formula based on SCr of 1.4 mg/dL (  H)). Liver Function Tests: Recent Labs  Lab 11/03/17 1715  AST 19  ALT 13  ALKPHOS 82  BILITOT 0.6  PROT 6.2*  ALBUMIN 3.2*   No results for input(s): LIPASE, AMYLASE in the last 168 hours. No results for input(s): AMMONIA in the last 168 hours. Coagulation Profile: Recent Labs  Lab 11/03/17 1715  INR 2.44   Cardiac Enzymes: Recent Labs  Lab 11/03/17 2242 11/04/17 0521 11/04/17 0838  TROPONINI <0.03 <0.03 <0.03   BNP (last 3 results) No results for input(s): PROBNP in the last 8760 hours. HbA1C: No results for input(s): HGBA1C in the last 72 hours. CBG: Recent Labs  Lab 11/07/17 1619 11/07/17 2125 11/08/17 0619 11/08/17 1112 11/08/17 1622  GLUCAP 85 109* 83 73  83   Lipid Profile: No results for input(s): CHOL, HDL, LDLCALC, TRIG, CHOLHDL, LDLDIRECT in the last 72 hours. Thyroid Function Tests: No results for input(s): TSH, T4TOTAL, FREET4, T3FREE, THYROIDAB in the last 72 hours. Anemia Panel: No results for input(s): VITAMINB12, FOLATE, FERRITIN, TIBC, IRON, RETICCTPCT in the last 72 hours. Sepsis Labs: No results for input(s): PROCALCITON, LATICACIDVEN in the last 168 hours.  Recent Results (from the past 240 hour(s))  MRSA PCR Screening     Status: None   Collection Time: 11/03/17 10:42 PM  Result Value Ref Range Status   MRSA by PCR NEGATIVE NEGATIVE Final    Comment:        The GeneXpert MRSA Assay (FDA approved for NASAL specimens only), is one component of a comprehensive MRSA colonization surveillance program. It is not intended to diagnose MRSA infection nor to guide or monitor treatment for MRSA infections. Performed at [facility], [address], [place], [place] [address]      Radiology Studies: No results found.  Scheduled Meds: . escitalopram  15 mg Oral Daily  . finasteride  5 mg Oral QHS  . insulin aspart  0-9 Units Subcutaneous TID WC  . levothyroxine  88 mcg Oral QAC breakfast  . rivaroxaban  20 mg Oral Q supper  . rOPINIRole  0.5 mg Oral QHS   Continuous Infusions:   LOS: 0 days   Rickey Barbara, MD [facility] Pager On Amion  If 7PM-7AM, please contact night-coverage 11/08/2017, 4:27 PM

## 2017-11-08 NOTE — Progress Notes (Signed)
CSW spoke to admissions at [facility]. The facility continues to await [facility] authorization for patient to return. CSW to follow.  Abigail Butts, LCSWA [phone]

## 2017-11-08 NOTE — Progress Notes (Signed)
Physical Therapy Treatment Patient Details Name: [name] MRN: [id number] DOB: Dec 15, 1930 Today's Date: 11/08/2017    History of Present Illness Pt is an 84 y/o male admitted secondary to L arm weakness and L facial droop. Found to have symtpmatic bradycardia. CT of head negative for acute abnormality. PMH includes a fib, DVT, CKD, DM, and HTN.     PT Comments    Continuing work on functional mobility and activity tolerance;  Able to tolerate lateral scoot OOB to chair with drop-arm down with 2 person max assist; He is very motivated to get better and be able to move better; recommending re-starting therapy services at SNF   Follow Up Recommendations  SNF;Supervision for mobility/OOB     Equipment Recommendations  None recommended by PT    Recommendations for Other Services       Precautions / Restrictions Precautions Precautions: Fall    Mobility  Bed Mobility Overal bed mobility: Needs Assistance Bed Mobility: Rolling;Sidelying to Sit Rolling: Max assist;+2 for physical assistance Sidelying to sit: Max assist;+2 for physical assistance       General bed mobility comments: Max assist and use of bed pad to  roll L; Max assist of 2 to move sidelie to sit  Transfers Overall transfer level: Needs assistance   Transfers: Lateral/Scoot Transfers          Lateral/Scoot Transfers: +2 physical assistance;Max assist General transfer comment: Performed lateral scoot transfer with Max assist of 2  Ambulation/Gait                 Stairs             Wheelchair Mobility    Modified Rankin (Stroke Patients Only)       Balance     Sitting balance-Leahy Scale: Poor Sitting balance - Comments: Today required at least min assist for sitting balance  Postural control: Posterior lean                                  Cognition Arousal/Alertness: Awake/alert Behavior During Therapy: WFL for tasks assessed/performed Overall Cognitive  Status: History of cognitive impairments - at baseline                                        Exercises      General Comments        Pertinent Vitals/Pain Pain Assessment: Faces Faces Pain Scale: Hurts a little bit Pain Location: L shoulder with reaching; pt attributes this to arthritis Pain Descriptors / Indicators: Discomfort;Grimacing Pain Intervention(s): Monitored during session    Home Living                      Prior Function            PT Goals (current goals can now be found in the care plan section) Acute Rehab PT Goals Patient Stated Goal: to be able to walk again  PT Goal Formulation: With patient Time For Goal Achievement: 11/18/17 Potential to Achieve Goals: Fair Progress towards PT goals: Progressing toward goals    Frequency    Min 2X/week      PT Plan Current plan remains appropriate    Co-evaluation              AM-PAC PT "6 Clicks" Daily Activity  Outcome Measure  Difficulty turning over in bed (including adjusting bedclothes, sheets and blankets)?: Unable Difficulty moving from lying on back to sitting on the side of the bed? : Unable Difficulty sitting down on and standing up from a chair with arms (e.g., wheelchair, bedside commode, etc,.)?: Unable Help needed moving to and from a bed to chair (including a wheelchair)?: Total Help needed walking in hospital room?: Total Help needed climbing 3-5 steps with a railing? : Total 6 Click Score: 6    End of Session Equipment Utilized During Treatment: (bed pad) Activity Tolerance: Patient tolerated treatment well Patient left: in chair;with call bell/phone within reach Nurse Communication: Mobility status;Need for lift equipment;Other (comment)(needs chair alarm box in room) PT Visit Diagnosis: Other abnormalities of gait and mobility (R26.89);Muscle weakness (generalized) (M62.81);Unsteadiness on feet (R26.81);Difficulty in walking, not elsewhere classified  (R26.2)     Time: 9629-5284 PT Time Calculation (min) (ACUTE ONLY): 24 min  Charges:  $Therapeutic Activity: 23-37 mins                     [name], [place]  Acute Rehabilitation Services Pager [phone] Office [phone]    Levi Aland 11/08/2017, 1:50 PM

## 2017-11-09 DIAGNOSIS — R001 Bradycardia, unspecified: Secondary | ICD-10-CM | POA: Diagnosis not present

## 2017-11-09 DIAGNOSIS — G459 Transient cerebral ischemic attack, unspecified: Secondary | ICD-10-CM | POA: Diagnosis not present

## 2017-11-09 DIAGNOSIS — I10 Essential (primary) hypertension: Secondary | ICD-10-CM | POA: Diagnosis not present

## 2017-11-09 LAB — GLUCOSE, CAPILLARY
GLUCOSE-CAPILLARY: 89 mg/dL (ref 70–99)
Glucose-Capillary: 88 mg/dL (ref 70–99)

## 2017-11-09 MED ORDER — HYDROCODONE-ACETAMINOPHEN 5-325 MG PO TABS: 1.0000 | ORAL_TABLET | Freq: Two times a day (BID) | ORAL | 0 refills | Status: DC | PRN

## 2017-11-09 MED ORDER — ALPRAZOLAM 0.25 MG PO TABS: 0.2500 mg | ORAL_TABLET | Freq: Two times a day (BID) | ORAL | 0 refills | Status: DC | PRN

## 2017-11-09 NOTE — NC FL2 (Signed)
[place] MEDICAID FL2 LEVEL OF CARE SCREENING TOOL     IDENTIFICATION  Patient Name: [name] Birthdate: 1930-09-24 Sex: male Admission Date (Current Location): 11/03/2017  [facility] and [place] Number:  Producer, television/film/video and Address:  [facility] [place], [address] [place], [place] [address]      Provider Number: [id number]  Attending Physician Name and Address:  Jerald Kief, MD  Relative Name and Phone Number:  [name] Leichter,[phone]    Current Level of Care: SNF Recommended Level of Care: Skilled Nursing Facility Prior Approval Number:    Date Approved/Denied:   PASRR Number: [id number]  Discharge Plan: SNF    Current Diagnoses: Patient Active Problem List   Diagnosis Date Noted  . Facial droop 11/04/2017  . Left arm weakness 11/04/2017  . Symptomatic bradycardia 11/03/2017  . Bradycardia   . Transient alteration of awareness   . Bilateral leg weakness 06/27/2017  . Depression 12/07/2016  . Alzheimer's type dementia (HCC) 12/07/2016  . Anxiety 01/11/2016  . Other chronic pain 06/17/2015  . Grief 06/17/2015  . Major depression 06/17/2015  . Type 2 diabetes mellitus with complication (HCC)   . Discitis 01/10/2015  . Osteomyelitis of thoracic spine (HCC) 01/10/2015  . Thoracic spine pain 11/28/2014  . Elevated PSA 11/08/2014  . Prosthetic hip infection (HCC) 02/16/2013  . MSSA (methicillin susceptible Staphylococcus aureus) infection   . Infected pacemaker (HCC)   . Closed dislocation of right hip (HCC) 10/02/2012    Class: Acute  . Retroperitoneal fluid collection 08/28/2012  . Staphylococcus aureus bacteremia 08/27/2012  . Hypokalemia 08/26/2012  . DM type 2 (diabetes mellitus, type 2) (HCC) 08/26/2012  . Thrombocytopenia (HCC) 03/21/2012  . Hypothyroidism 02/14/2012  . Hyperlipidemia LDL goal <100 02/14/2012  . Benign prostatic hyperplasia 02/14/2012  . Lower extremity weakness 02/11/2012  . Anemia in  chronic kidney disease 02/11/2012  . Chronic kidney disease, stage III (moderate) (HCC) 02/11/2012  . Atrial fibrillation (HCC) 02/11/2012  . Spinal stenosis 02/11/2012  . Hip dislocation, right (HCC) 11/30/2010  . Essential hypertension 11/30/2010    Orientation RESPIRATION BLADDER Height & Weight     Self, Situation, Time, Place  Normal Continent Weight: 243 lb 13.3 oz (110.6 kg) Height:  5' 11.5" (181.6 cm)  BEHAVIORAL SYMPTOMS/MOOD NEUROLOGICAL BOWEL NUTRITION STATUS      Continent Diet(heart healthy/carb modified)  AMBULATORY STATUS COMMUNICATION OF NEEDS Skin   Extensive Assist Verbally Normal                       Personal Care Assistance Level of Assistance  Bathing, Feeding, Dressing Bathing Assistance: Maximum assistance Feeding assistance: Independent Dressing Assistance: Maximum assistance     Functional Limitations Info  Hearing, Speech, Sight Sight Info: Adequate Hearing Info: Adequate Speech Info: Adequate    SPECIAL CARE FACTORS FREQUENCY  PT (By licensed PT), OT (By licensed OT)     PT Frequency: 5x wk OT Frequency: 5x wk            Contractures Contractures Info: Not present    Additional Factors Info  Code Status, Allergies Code Status Info: full code Allergies Info: ACE INHIBITORS, GABAPENTIN, NSAIDS, ZOLPIDEM           Current Medications (11/09/2017):  This is the current hospital active medication list Current Facility-Administered Medications  Medication Dose Route Frequency Provider Last Rate Last Dose  . acetaminophen (TYLENOL) tablet 650 mg  650 mg Oral Q6H PRN Almon Hercules, MD  650 mg at 11/09/17 1610   Or  . acetaminophen (TYLENOL) suppository 650 mg  650 mg Rectal Q6H PRN Candelaria Stagers T, MD      . ALPRAZolam Prudy Feeler) tablet 0.25 mg  0.25 mg Oral BID PRN Candelaria Stagers T, MD   0.25 mg at 11/07/17 1005  . escitalopram (LEXAPRO) tablet 15 mg  15 mg Oral Daily Gonfa, Taye T, MD   15 mg at 11/09/17 1100  . finasteride (PROSCAR)  tablet 5 mg  5 mg Oral QHS Candelaria Stagers T, MD   5 mg at 11/08/17 2130  . HYDROcodone-acetaminophen (NORCO/VICODIN) 5-325 MG per tablet 1 tablet  1 tablet Oral Q12H PRN Almon Hercules, MD   1 tablet at 11/08/17 1439  . insulin aspart (novoLOG) injection 0-9 Units  0-9 Units Subcutaneous TID WC Gonfa, Taye T, MD      . levothyroxine (SYNTHROID, LEVOTHROID) tablet 88 mcg  88 mcg Oral QAC breakfast Almon Hercules, MD   88 mcg at 11/09/17 0639  . ondansetron (ZOFRAN) tablet 4 mg  4 mg Oral Q6H PRN Gonfa, Taye T, MD       Or  . ondansetron (ZOFRAN) injection 4 mg  4 mg Intravenous Q6H PRN Gonfa, Taye T, MD      . rivaroxaban (XARELTO) tablet 20 mg  20 mg Oral Q supper Candelaria Stagers T, MD   20 mg at 11/08/17 1811  . rOPINIRole (REQUIP) tablet 0.5 mg  0.5 mg Oral QHS Candelaria Stagers T, MD   0.5 mg at 11/08/17 2130     Discharge Medications: Please see discharge summary for a list of discharge medications.  Relevant Imaging Results:  Relevant Lab Results:   Additional Information SS# [id number]  Althea Charon, LCSW

## 2017-11-09 NOTE — Progress Notes (Signed)
Patient's facility, [facility], has received insurance authorization for patient to return. Paged MD. CSW to support with discharge.  Abigail Butts, LCSWA [phone]

## 2017-11-09 NOTE — Plan of Care (Signed)
  Problem: Education: Goal: Knowledge of General Education information will improve Description Including pain rating scale, medication(s)/side effects and non-pharmacologic comfort measures Outcome: Adequate for Discharge   Problem: Cardiac: Goal: Ability to achieve and maintain adequate cardiopulmonary perfusion will improve Outcome: Adequate for Discharge   Problem: Health Behavior/Discharge Planning: Goal: Ability to safely manage health-related needs after discharge will improve Outcome: Adequate for Discharge   Problem: Fluid Volume: Goal: Ability to maintain a balanced intake and output will improve Outcome: Adequate for Discharge   Problem: Metabolic: Goal: Ability to maintain appropriate glucose levels will improve Outcome: Adequate for Discharge   Problem: Skin Integrity: Goal: Risk for impaired skin integrity will decrease Outcome: Adequate for Discharge   Problem: Tissue Perfusion: Goal: Adequacy of tissue perfusion will improve Outcome: Adequate for Discharge   Problem: Elimination: Goal: Will not experience complications related to bowel motility Outcome: Adequate for Discharge Goal: Will not experience complications related to urinary retention Outcome: Adequate for Discharge   Problem: Pain Managment: Goal: General experience of comfort will improve Outcome: Adequate for Discharge   Problem: Safety: Goal: Ability to remain free from injury will improve Outcome: Adequate for Discharge

## 2017-11-09 NOTE — Progress Notes (Signed)
[facility] transfering patient to facility and all belongings taken with him. He is in stable condition and denies questions or concerns.

## 2017-11-09 NOTE — Progress Notes (Signed)
Clinical Social Worker facilitated patient discharge including contacting patient family and facility to confirm patient discharge plans.  Clinical information faxed to facility and family agreeable with plan.  CSW arranged ambulance transport via [facility] to [facility] .  RN to call [phone] (rm# [phone]) for report prior to discharge.  Clinical Social Worker will sign off for now as social work intervention is no longer needed. Please consult [place] again if new need arises.  Marrianne Mood, MSW, [employer] [phone]

## 2017-11-09 NOTE — Discharge Summary (Signed)
Physician Discharge Summary  [name] ZOX:[id number] DOB: 1930/02/13 DOA: 11/03/2017  PCP: Malka So., MD  Admit date: 11/03/2017 Discharge date: 11/09/2017  Admitted From: SNF Disposition:  SNF  Recommendations for Outpatient Follow-up:  1. Follow up with PCP in 1-2 weeks 2. 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 Also bradycardic to 20s requiring pacing in ED. CT head without acute intracranial process. Serial troponin and EKG without acute ischemic finding.  Active Problems:   Essential hypertension   DM type 2 (diabetes mellitus, type 2) (HCC)   Symptomatic bradycardia   Facial droop   Left arm weakness  Altered mental status/facial droop/left arm weakness:  -resolved  -Neurology had been following -MRI unremarkable for acute pathology. Neuro recommendation for continued Xarelto. Stop ASA per Cardiology -PT/OT/SLP eval with recs for SNF  Symptomatic bradycardia:Resolved. Likely due to nodal blocking agents. He is on BB and CCB -Cardiology had been following, have stopped BB and CCB. -HR stable at present  History of diabetes. A1c 5.6%. -Serial glucose stable at present, FSBS reviewed -Resume home meds on discharge  Hypertension:Normotensive. -BP stable at present  Chronic pain:On Norco 10/325 twice daily prior to admit -Reducedto 5/325 -Stable at present  History of DVT -Xarelto continued.  Discharge Diagnoses:  Active Problems:   Essential hypertension   DM type 2 (diabetes mellitus, type 2) (HCC)   Symptomatic bradycardia   Facial droop   Left arm  weakness    Discharge Instructions   Allergies as of 11/09/2017      Reactions   Ace Inhibitors Other (See Comments)   Cough Cough Cough   Gabapentin    Nsaids Other (See Comments)   Renal insuff Other reaction(s): Other (See Comments) Renal insuff Renal insuff   Zolpidem Other (See Comments)   hallucinations Nightmares, irritable, nervouse Disorientation      Medication List    STOP taking these medications   aspirin 325 MG EC tablet   cephALEXin 500 MG capsule Commonly known as:  KEFLEX   cyclobenzaprine 5 MG tablet Commonly known as:  FLEXERIL   diltiazem 120 MG 24 hr capsule Commonly known as:  CARDIZEM CD   HYDROcodone-acetaminophen 10-325 MG tablet Commonly known as:  NORCO Replaced by:  HYDROcodone-acetaminophen 5-325 MG tablet   metoprolol tartrate 25 MG tablet Commonly known as:  LOPRESSOR   ONE TOUCH ULTRA TEST test strip Generic drug:  glucose blood   potassium chloride SA 20 MEQ tablet Commonly known as:  K-DUR,KLOR-CON     TAKE these medications   ALPRAZolam 0.25 MG tablet Commonly known as:  XANAX Take 1 tablet (0.25 mg total) by mouth 2 (two) times daily as needed for anxiety (do not give if Drowsiness is present).   ASPERCREME LIDOCAINE 4 % Ptch Generic drug:  Lidocaine Apply 1 patch topically every 12 (twelve) hours. Apply one patch between shoulder and elbow on both sides each day for 12 hrs AM and PM off   docusate sodium 100 MG capsule Commonly known as:  COLACE Take 100 mg by mouth 2 (two) times daily.   escitalopram 10 MG tablet Commonly known as:  LEXAPRO Take 15 mg by mouth daily.   ferrous sulfate 325 (  65 FE) MG tablet Take 325 mg by mouth daily with breakfast.   finasteride 5 MG tablet Commonly known as:  PROSCAR Take 5 mg by mouth at bedtime.   HYDROcodone-acetaminophen 5-325 MG tablet Commonly known as:  NORCO/VICODIN Take 1 tablet by mouth every 12 (twelve) hours as needed for moderate pain. Replaces:   HYDROcodone-acetaminophen 10-325 MG tablet   lactulose 10 GM/15ML solution Commonly known as:  CHRONULAC Take 10-30 g by mouth See admin instructions. Take 45 mls by mouth every am and 15 mls every evening for constipation Hold for loose stool   levothyroxine 88 MCG tablet Commonly known as:  SYNTHROID, LEVOTHROID Take 88 mcg by mouth daily before breakfast.   lovastatin 40 MG tablet Commonly known as:  MEVACOR Take 40 mg by mouth at bedtime.   magnesium hydroxide 400 MG/5ML suspension Commonly known as:  MILK OF MAGNESIA Take 30 mLs by mouth 2 (two) times daily as needed for mild constipation.   pioglitazone 15 MG tablet Commonly known as:  ACTOS Take 15 mg by mouth daily.   rOPINIRole 0.5 MG tablet Commonly known as:  REQUIP Take 0.5 mg by mouth at bedtime.   telmisartan 40 MG tablet Commonly known as:  MICARDIS Take 40 mg by mouth daily.   terazosin 5 MG capsule Commonly known as:  HYTRIN Take 5 mg by mouth at bedtime.   torsemide 10 MG tablet Commonly known as:  DEMADEX Take 10 mg by mouth daily.   XARELTO 20 MG Tabs tablet Generic drug:  rivaroxaban Take 20 mg by mouth daily.      Contact information for after-discharge care    Destination    [facility] .   Service:  Skilled Nursing Contact information: [address] [place] [address] [phone]             Allergies  Allergen Reactions  . Ace Inhibitors Other (See Comments)    Cough Cough  Cough  . Gabapentin   . Nsaids Other (See Comments)    Renal insuff Other reaction(s): Other (See Comments) Renal insuff Renal insuff   . Zolpidem Other (See Comments)    hallucinations Nightmares, irritable, nervouse Disorientation    Consultations:  Cardiology  neurology  Procedures/Studies: Ct Head Wo Contrast  Result Date: 10/25/2017 CLINICAL DATA:  Altered level of consciousness.  Multiple falls EXAM: CT HEAD WITHOUT CONTRAST TECHNIQUE:  Contiguous axial images were obtained from the base of the skull through the vertex without intravenous contrast. COMPARISON:  None. FINDINGS: Brain: Advanced atrophy. Diffuse ventricular enlargement compatible with atrophy. Mild chronic appearing changes in the white matter. Negative for acute infarct, hemorrhage, or mass. Negative for extra-axial fluid collection or midline shift. Vascular: Extensive arterial calcification. Negative for hyperdense vessel Skull: Negative Sinuses/Orbits: Bilateral cataract surgery. Mild mucosal edema paranasal sinuses. Other: None IMPRESSION: Atrophy and chronic microvascular ischemia. Extensive atherosclerotic disease. No acute abnormality. Electronically Signed   By: Marlan Palau M.D.   On: 10/25/2017 16:12   Mr Maxine Glenn Head Wo Contrast  Result Date: 11/04/2017 CLINICAL DATA:  Initial evaluation for acute altered mental status, facial droop, left arm weakness. EXAM: MRI HEAD WITHOUT CONTRAST MRA HEAD WITHOUT CONTRAST TECHNIQUE: Multiplanar, multiecho pulse sequences of the brain and surrounding structures were obtained without intravenous contrast. Angiographic images of the head were obtained using MRA technique without contrast. COMPARISON:  Prior CT from 11/03/2017. FINDINGS: MRI HEAD FINDINGS Brain: Examination moderately degraded by motion artifact. Diffuse prominence of the CSF containing spaces compatible  with generalized cerebral atrophy. Probable mild chronic microvascular ischemic changes present within the periventricular white matter. No abnormal foci of restricted diffusion to suggest acute or subacute ischemia. Gray-white matter differentiation maintained. No areas of remote cortical infarction. No acute or chronic intracranial hemorrhage. No mass lesion, midline shift or mass effect. Diffuse ventricular prominence related global parenchymal volume loss of hydrocephalus. No extra-axial fluid collection. Pituitary gland grossly normal. Vascular: Major intracranial  vascular flow voids are maintained. Skull and upper cervical spine: Craniocervical junction normal. No focal marrow replacing lesion. Scalp soft tissues unremarkable. Sinuses/Orbits: Globes and orbital soft tissues within normal limits. Patient status post ocular lens replacement bilaterally. Mild scattered mucosal thickening throughout the ethmoidal air cells and maxillary sinuses. Paranasal sinuses are otherwise clear. Trace opacity bilateral mastoid air cells, of doubtful significance. Inner ear structures grossly normal. Other: None. MRA HEAD FINDINGS ANTERIOR CIRCULATION: Examination moderately degraded by motion artifact. Distal cervical segments of the internal carotid arteries are widely patent with antegrade flow. Petrous segments widely patent bilaterally. Atheromatous irregularity within the cavernous/supraclinoid ICAs without high-grade stenosis. A1 segments patent bilaterally. Anterior communicating artery not well assessed due to motion. Anterior cerebral arteries patent distally without obvious stenosis. M1 segments patent bilaterally. Normal MCA bifurcations. No appreciable proximal M2 occlusion distal MCA branches well perfused and symmetric. POSTERIOR CIRCULATION: Scattered atheromatous irregularity within the vertebral arteries bilaterally without high-grade stenosis. Patent right PICA. Left PICA not visualized. Basilar widely patent to its distal aspect. Superior cerebral arteries patent bilaterally. Both of the PCA supplied via the basilar. Scattered atheromatous irregularity throughout the visualized proximal and mid PCAs. PCAs not well assessed distally due to motion. No obvious intracranial aneurysm. IMPRESSION: MRI HEAD IMPRESSION: 1. Limited examination due to extensive motion artifact. 2. No acute intracranial infarct or other abnormality identified. 3. Moderately advanced cerebral atrophy with mild chronic small vessel ischemic disease. MRA HEAD IMPRESSION: 1. Limited examination due to  extensive motion artifact. 2. Negative intracranial MRA for large vessel occlusion. Moderate intracranial atherosclerotic change without hemodynamically significant or correctable stenosis. Electronically Signed   By: Rise Mu M.D.   On: 11/04/2017 22:11   Mr Brain Wo Contrast  Result Date: 11/04/2017 CLINICAL DATA:  Initial evaluation for acute altered mental status, facial droop, left arm weakness. EXAM: MRI HEAD WITHOUT CONTRAST MRA HEAD WITHOUT CONTRAST TECHNIQUE: Multiplanar, multiecho pulse sequences of the brain and surrounding structures were obtained without intravenous contrast. Angiographic images of the head were obtained using MRA technique without contrast. COMPARISON:  Prior CT from 11/03/2017. FINDINGS: MRI HEAD FINDINGS Brain: Examination moderately degraded by motion artifact. Diffuse prominence of the CSF containing spaces compatible with generalized cerebral atrophy. Probable mild chronic microvascular ischemic changes present within the periventricular white matter. No abnormal foci of restricted diffusion to suggest acute or subacute ischemia. Gray-white matter differentiation maintained. No areas of remote cortical infarction. No acute or chronic intracranial hemorrhage. No mass lesion, midline shift or mass effect. Diffuse ventricular prominence related global parenchymal volume loss of hydrocephalus. No extra-axial fluid collection. Pituitary gland grossly normal. Vascular: Major intracranial vascular flow voids are maintained. Skull and upper cervical spine: Craniocervical junction normal. No focal marrow replacing lesion. Scalp soft tissues unremarkable. Sinuses/Orbits: Globes and orbital soft tissues within normal limits. Patient status post ocular lens replacement bilaterally. Mild scattered mucosal thickening throughout the ethmoidal air cells and maxillary sinuses. Paranasal sinuses are otherwise clear. Trace opacity bilateral mastoid air cells, of doubtful  significance. Inner ear structures grossly normal. Other: None. MRA HEAD FINDINGS ANTERIOR CIRCULATION:  Examination moderately degraded by motion artifact. Distal cervical segments of the internal carotid arteries are widely patent with antegrade flow. Petrous segments widely patent bilaterally. Atheromatous irregularity within the cavernous/supraclinoid ICAs without high-grade stenosis. A1 segments patent bilaterally. Anterior communicating artery not well assessed due to motion. Anterior cerebral arteries patent distally without obvious stenosis. M1 segments patent bilaterally. Normal MCA bifurcations. No appreciable proximal M2 occlusion distal MCA branches well perfused and symmetric. POSTERIOR CIRCULATION: Scattered atheromatous irregularity within the vertebral arteries bilaterally without high-grade stenosis. Patent right PICA. Left PICA not visualized. Basilar widely patent to its distal aspect. Superior cerebral arteries patent bilaterally. Both of the PCA supplied via the basilar. Scattered atheromatous irregularity throughout the visualized proximal and mid PCAs. PCAs not well assessed distally due to motion. No obvious intracranial aneurysm. IMPRESSION: MRI HEAD IMPRESSION: 1. Limited examination due to extensive motion artifact. 2. No acute intracranial infarct or other abnormality identified. 3. Moderately advanced cerebral atrophy with mild chronic small vessel ischemic disease. MRA HEAD IMPRESSION: 1. Limited examination due to extensive motion artifact. 2. Negative intracranial MRA for large vessel occlusion. Moderate intracranial atherosclerotic change without hemodynamically significant or correctable stenosis. Electronically Signed   By: Rise Mu M.D.   On: 11/04/2017 22:11   Dg Chest Portable 1 View  Result Date: 10/25/2017 CLINICAL DATA:  Patient with weakness. EXAM: PORTABLE CHEST 1 VIEW COMPARISON:  Chest radiograph 08/12/2017 FINDINGS: Monitoring leads overlie the patient.  Stable cardiomegaly. Aortic atherosclerosis. No consolidative pulmonary opacities. No pleural effusion or pneumothorax. Thoracic spine degenerative changes. IMPRESSION: No acute cardiopulmonary process.  Cardiomegaly. Electronically Signed   By: Annia Belt M.D.   On: 10/25/2017 15:01   Ct Head Code Stroke Wo Contrast  Result Date: 11/03/2017 CLINICAL DATA:  Code stroke. 84 year old male with left facial droop and arm weakness. Last known normal 1515 hours. EXAM: CT HEAD WITHOUT CONTRAST TECHNIQUE: Contiguous axial images were obtained from the base of the skull through the vertex without intravenous contrast. COMPARISON:  CT head 10/25/2017. FINDINGS: Brain: Stable cerebral volume with mild-to-moderate ventriculomegaly. No transependymal edema suspected. Patchy white matter hypodensity at the right posterior limb internal capsule and lentiform is stable. No acute intracranial hemorrhage, mass effect, or acute cortically based infarct. Vascular: Calcified atherosclerosis at the skull base. Trace gas in the right cavernous sinus. Chronic calcified right MCA atherosclerosis. No suspicious intracranial vascular hyperdensity. Skull: No acute osseous abnormality identified. Advanced degenerative changes in the visible upper cervical spine. Sinuses/Orbits: Visualized paranasal sinuses and mastoids are stable and well pneumatized. Other: Stable orbits soft tissues. Nonspecific soft tissue gas at the right forehead and right temporal region. Additionally, there is scattered right masticator space gas which appears to be intravenous (series 4, image 22). No definite acute soft tissue injury. ASPECTS (Alberta Stroke Program Early CT Score) - Ganglionic level infarction (caudate, lentiform nuclei, internal capsule, insula, M1-M3 cortex): 7 - Supraganglionic infarction (M4-M6 cortex): 3 Total score (0-10 with 10 being normal): 10 IMPRESSION: 1. Stable CT appearance of the brain with no acute cortically based infarct or  intracranial hemorrhage identified. ASPECTS is 10. 2. Gas in the right face appears to be within venous structures, and there is trace gas in the right cavernous sinus. This can be seen as a sequelae of recent intravenous access. 3. These results were communicated to Dr. Wilford Corner at 5:39 pmon 10/9/2019by text page via the [facility] messaging system. Electronically Signed   By: Odessa Fleming M.D.   On: 11/03/2017 17:39     Subjective: Eager to  be discharged  Discharge Exam: Vitals:   11/09/17 0608 11/09/17 0747  BP: 129/90 140/84  Pulse: 87 78  Resp: 13 18  Temp: 99.5 F (37.5 C) 99.3 F (37.4 C)  SpO2: 98% 95%   Vitals:   11/08/17 1956 11/09/17 0004 11/09/17 0608 11/09/17 0747  BP: 131/87 124/63 129/90 140/84  Pulse: 82 (!) 103 87 78  Resp: 18 (!) 26 13 18   Temp: 97.9 F (36.6 C) 100 F (37.8 C) 99.5 F (37.5 C) 99.3 F (37.4 C)  TempSrc: Oral Oral Oral Oral  SpO2: 97% 96% 98% 95%  Weight:      Height:        General: Pt is alert, awake, not in acute distress Cardiovascular: RRR, S1/S2 +, no rubs, no gallops Respiratory: CTA bilaterally, no wheezing, no rhonchi Abdominal: Soft, NT, ND, bowel sounds + Extremities: no edema, no cyanosis   The results of significant diagnostics from this hospitalization (including imaging, microbiology, ancillary and laboratory) are listed below for reference.     Microbiology: Recent Results (from the past 240 hour(s))  MRSA PCR Screening     Status: None   Collection Time: 11/03/17 10:42 PM  Result Value Ref Range Status   MRSA by PCR NEGATIVE NEGATIVE Final    Comment:        The GeneXpert MRSA Assay (FDA approved for NASAL specimens only), is one component of a comprehensive MRSA colonization surveillance program. It is not intended to diagnose MRSA infection nor to guide or monitor treatment for MRSA infections. Performed at [facility], [address], [place], [place] [address]      Labs: BNP (last 3 results) No results  for input(s): BNP in the last 8760 hours. Basic Metabolic Panel: Recent Labs  Lab 11/03/17 1715 11/03/17 1721 11/03/17 1738 11/04/17 0521  NA 139 140 139 138  K 4.5 4.5 4.5 4.6  CL 106 105 106 106  CO2 24  --   --  24  GLUCOSE 116* 111* 110* 87  BUN 33* 33* 33* 31*  CREATININE 1.52* 1.50* 1.50* 1.40*  CALCIUM 9.1  --   --  8.6*   Liver Function Tests: Recent Labs  Lab 11/03/17 1715  AST 19  ALT 13  ALKPHOS 82  BILITOT 0.6  PROT 6.2*  ALBUMIN 3.2*   No results for input(s): LIPASE, AMYLASE in the last 168 hours. No results for input(s): AMMONIA in the last 168 hours. CBC: Recent Labs  Lab 11/03/17 1715 11/03/17 1721 11/03/17 1738  WBC 6.8  --   --   NEUTROABS 4.2  --   --   HGB 9.2* 9.5* 9.5*  HCT 30.5* 28.0* 28.0*  MCV 99.7  --   --   PLT 187  --   --    Cardiac Enzymes: Recent Labs  Lab 11/03/17 2242 11/04/17 0521 11/04/17 0838  TROPONINI <0.03 <0.03 <0.03   BNP: Invalid input(s): POCBNP CBG: Recent Labs  Lab 11/08/17 0619 11/08/17 1112 11/08/17 1622 11/08/17 2156 11/09/17 0605  GLUCAP 83 73 83 93 89   D-Dimer No results for input(s): DDIMER in the last 72 hours. Hgb A1c No results for input(s): HGBA1C in the last 72 hours. Lipid Profile No results for input(s): CHOL, HDL, LDLCALC, TRIG, CHOLHDL, LDLDIRECT in the last 72 hours. Thyroid function studies No results for input(s): TSH, T4TOTAL, T3FREE, THYROIDAB in the last 72 hours.  Invalid input(s): FREET3 Anemia work up No results for input(s): VITAMINB12, FOLATE, FERRITIN, TIBC, IRON, RETICCTPCT  in the last 72 hours. Urinalysis    Component Value Date/Time   COLORURINE YELLOW 11/03/2017 0341   APPEARANCEUR HAZY (A) 11/03/2017 0341   LABSPEC 1.012 11/03/2017 0341   PHURINE 8.0 11/03/2017 0341   GLUCOSEU NEGATIVE 11/03/2017 0341   HGBUR SMALL (A) 11/03/2017 0341   BILIRUBINUR NEGATIVE 11/03/2017 0341   KETONESUR NEGATIVE 11/03/2017 0341   PROTEINUR NEGATIVE 11/03/2017 0341    UROBILINOGEN 0.2 12/30/2012 2015   NITRITE NEGATIVE 11/03/2017 0341   LEUKOCYTESUR MODERATE (A) 11/03/2017 0341   Sepsis Labs Invalid input(s): PROCALCITONIN,  WBC,  LACTICIDVEN Microbiology Recent Results (from the past 240 hour(s))  MRSA PCR Screening     Status: None   Collection Time: 11/03/17 10:42 PM  Result Value Ref Range Status   MRSA by PCR NEGATIVE NEGATIVE Final    Comment:        The GeneXpert MRSA Assay (FDA approved for NASAL specimens only), is one component of a comprehensive MRSA colonization surveillance program. It is not intended to diagnose MRSA infection nor to guide or monitor treatment for MRSA infections. Performed at [facility], [address] [place], [place] [address]    Time spent: 30 min  SIGNED:   Rickey Barbara, MD  [facility] 11/09/2017, 9:04 AM  If 7PM-7AM, please contact night-coverage

## 2017-11-10 ENCOUNTER — Encounter: Payer: Medicare Other | Admitting: Vascular Surgery

## 2017-11-24 ENCOUNTER — Other Ambulatory Visit: Payer: Self-pay

## 2017-11-24 NOTE — Patient Outreach (Signed)
[facility]) Care Management  11/24/2017  Thaddeus Barg [birth year] [id number]   Medication Adherence call to Mr. Quy Lotts  spoke with a family member she explain he will be a home care for a long time which they provide with all of his medications, patient is showing past due on Telmisaratn 40 mg and Pioglitazone 15 mg. Mr. Pellow is showing past due under [facility] Ins.   Lillia Abed CPhT Pharmacy Technician [facility] Care Management Direct Dial [phone]  Fax [phone] [email or web address]

## 2017-12-06 ENCOUNTER — Encounter: Payer: Medicare Other | Admitting: Surgery

## 2017-12-31 ENCOUNTER — Other Ambulatory Visit: Payer: Self-pay

## 2017-12-31 ENCOUNTER — Encounter: Payer: Self-pay | Admitting: Vascular Surgery

## 2017-12-31 ENCOUNTER — Ambulatory Visit (INDEPENDENT_AMBULATORY_CARE_PROVIDER_SITE_OTHER): Payer: Medicare Other | Admitting: Vascular Surgery

## 2017-12-31 VITALS — BP 85/56 | HR 69 | Temp 97.3°F | Resp 20 | Ht 72.0 in | Wt 246.0 lb

## 2017-12-31 DIAGNOSIS — I87092 Postthrombotic syndrome with other complications of left lower extremity: Secondary | ICD-10-CM | POA: Diagnosis not present

## 2017-12-31 NOTE — Progress Notes (Signed)
Patient ID: [name], male   DOB: 30-Mar-1930, 84 y.o.   MRN: [id number]  Reason for Consult: New Patient (Initial Visit) (new onset DVT)   Referred by Malka So., MD  Subjective:     HPI:  Michaeal Davis is a 84 y.o. male now resident at [facility] and has not walk he says for at least 12 weeks.  Had what appears to be a large DVT back in September of this year.  Is also been admitted to the hospital with altered mental status.  He is now in a wheelchair 2 hours a day spends the rest of the time confined in bed.  Has persistent left lower extremity swelling does not have ulceration.  Says of the leg is heavy but this is not really his limitation to walking as neither leg can support him given his lack of strength at this time.  He is not wearing compression stockings.  He does not elevate his legs but he does take Xarelto per her report.  He does have some limitations with memory given diagnosis of Alzheimer's.    Past Medical History:  Diagnosis Date  . Alzheimer's type dementia (HCC) 12/07/2016  . Anemia   . Anxiety   . Arthritis   . Bacteremia   . Bradycardia    a. PPM removed in 2014 due to bacteremia.  . Chronic atrial fibrillation   . Chronic right shoulder pain 06/17/2015  . CKD (chronic kidney disease), stage III (HCC)   . Complication of anesthesia    " Terrible Nightmares"  . Constipation   . Depression   . Diabetes mellitus without complication (HCC)    Type II  . DVT (deep venous thrombosis) (HCC)    a. in 2019 - pt reports behind knee - was on Xarelto 15mg  BID now 20mg  daily.  . Encounter for removal of cardiac resynchronization therapy pacemaker 2017  . Grief 06/17/2015  . History of blood product transfusion   . Hypertension   . Hypothyroidism   . Infected pacemaker (HCC)   . Major depression 06/17/2015  . MSSA (methicillin susceptible Staphylococcus aureus) infection   . OAB (overactive bladder)   . Overactive bladder   . Septic hip (HCC)   .  Septicemia (HCC)   . Severe depression (HCC) 12/07/2016  . Staphylococcus aureus infection   . Thoracic spine pain 11/28/2014   Family History  Problem Relation Age of Onset  . Heart failure Mother    Past Surgical History:  Procedure Laterality Date  . COLONOSCOPY W/ POLYPECTOMY     small  . EYE SURGERY     bilateral  . HIP CLOSED REDUCTION Right 10/02/2012   Procedure: CLOSED REDUCTION HIP;  Surgeon: Velna Ochs, MD;  Location: [facility] OR;  Service: Orthopedics;  Laterality: Right;  . ICD LEAD REMOVAL Left 08/31/2012   Procedure: ICD LEAD REMOVAL;  Surgeon: Marinus Maw, MD;  Location[facility];  Service: Cardiovascular;  Laterality: Left;  . INCISION AND DRAINAGE HIP Right 09/02/2012   Procedure: IRRIGATION AND DEBRIDEMENT RIGHT HIP;  Surgeon: Nestor Lewandowsky, MD;  Location: [facility] OR;  Service: Orthopedics;  Laterality: Right;  . INCISION AND DRAINAGE HIP Right 08/10/2014   Procedure: IRRIGATION AND DEBRIDEMENT RIGHT HIP SUPERFICIAL VS DEEP;  Surgeon: Gean Birchwood, MD;  Location: [facility] OR;  Service: Orthopedics;  Laterality: Right;  . INSERT / REPLACE / REMOVE PACEMAKER     removed in 2017 d/t sepsis/notes 11/03/2017  . JOINT REPLACEMENT    .  LUMBAR LAMINECTOMY/DECOMPRESSION MICRODISCECTOMY  02/14/2012   Procedure: LUMBAR LAMINECTOMY/DECOMPRESSION MICRODISCECTOMY 1 LEVEL;  Surgeon: Tia Alert, MD;  Location: [facility] NEURO ORS;  Service: Neurosurgery;  Laterality: N/A;  Thoracic twelve - Lumbar one decompressive laminectomy.  Marland Kitchen RADIOLOGY WITH ANESTHESIA N/A 01/10/2015   Procedure: MRI LUMBAR SPINE WITH/WITHOUT   (RADIOLOGY WITH ANESTHESIA);  Surgeon: Medication Radiologist, MD;  Location: [facility] OR;  Service: Radiology;  Laterality: N/A;  . TEE WITHOUT CARDIOVERSION N/A 08/30/2012   Procedure: TRANSESOPHAGEAL ECHOCARDIOGRAM (TEE);  Surgeon: Laurey Morale, MD;  Location: [facility] ENDOSCOPY;  Service: Cardiovascular;  Laterality: N/A;  . TONSILLECTOMY    . TOTAL HIP REVISION Right 09/02/2012   Procedure: REVISION OF  POLYETHYLENE LINER AND FEMORAL HEAD ;  Surgeon: Nestor Lewandowsky, MD;  Location: [facility] OR;  Service: Orthopedics;  Laterality: Right;  . TOTAL HIP REVISION Right 11/28/2012   Procedure: TOTAL HIP REVISION With Placement of Contrain Liner;  Surgeon: Nestor Lewandowsky, MD;  Location: [facility] OR;  Service: Orthopedics;  Laterality: Right;    Short Social History:  Social History   Tobacco Use  . Smoking status: Never Smoker  . Smokeless tobacco: Never Used  Substance Use Topics  . Alcohol use: No    Alcohol/week: 0.0 standard drinks    Allergies  Allergen Reactions  . Ace Inhibitors Other (See Comments)    Cough Cough  Cough  . Gabapentin   . Nsaids Other (See Comments)    Renal insuff Other reaction(s): Other (See Comments) Renal insuff Renal insuff   . Zolpidem Other (See Comments)    hallucinations Nightmares, irritable, nervouse Disorientation    Current Outpatient Medications  Medication Sig Dispense Refill  . albuterol (PROVENTIL) (2.5 MG/3ML) 0.083% nebulizer solution     . ALPRAZolam (XANAX) 0.25 MG tablet Take 1 tablet (0.25 mg total) by mouth 2 (two) times daily as needed for anxiety (do not give if Drowsiness is present). 2 tablet 0  . divalproex (DEPAKOTE) 125 MG DR tablet     . docusate sodium (COLACE) 100 MG capsule Take 100 mg by mouth 2 (two) times daily.     Marland Kitchen escitalopram (LEXAPRO) 10 MG tablet Take 15 mg by mouth daily.     . ferrous sulfate 325 (65 FE) MG tablet Take 325 mg by mouth daily with breakfast.    . finasteride (PROSCAR) 5 MG tablet Take 5 mg by mouth at bedtime.     Marland Kitchen HYDROcodone-acetaminophen (NORCO/VICODIN) 5-325 MG tablet Take 1 tablet by mouth every 12 (twelve) hours as needed for moderate pain. 2 tablet 0  . lactulose (CHRONULAC) 10 GM/15ML solution Take 10-30 g by mouth See admin instructions. Take 45 mls by mouth every am and 15 mls every evening for constipation Hold for loose stool    . levothyroxine (SYNTHROID, LEVOTHROID) 88 MCG tablet Take 88  mcg by mouth daily before breakfast.    . Lidocaine (ASPERCREME LIDOCAINE) 4 % PTCH Apply 1 patch topically every 12 (twelve) hours. Apply one patch between shoulder and elbow on both sides each day for 12 hrs AM and PM off    . lovastatin (MEVACOR) 40 MG tablet Take 40 mg by mouth at bedtime.     . magnesium hydroxide (MILK OF MAGNESIA) 400 MG/5ML suspension Take 30 mLs by mouth 2 (two) times daily as needed for mild constipation.    . pioglitazone (ACTOS) 15 MG tablet Take 15 mg by mouth daily.    . promethazine (PHENERGAN) 25 MG tablet     .  rOPINIRole (REQUIP) 0.5 MG tablet Take 0.5 mg by mouth at bedtime.     Marland Kitchen. telmisartan (MICARDIS) 40 MG tablet Take 40 mg by mouth daily.    Marland Kitchen. terazosin (HYTRIN) 5 MG capsule Take 5 mg by mouth at bedtime.     Marland Kitchen. tiZANidine (ZANAFLEX) 2 MG tablet     . torsemide (DEMADEX) 10 MG tablet Take 10 mg by mouth daily.     Carlena Hurl. XARELTO 20 MG TABS tablet Take 20 mg by mouth daily.      No current facility-administered medications for this visit.     Review of Systems  Constitutional:  Constitutional negative. HENT: HENT negative.  Eyes: Eyes negative.  Respiratory: Respiratory negative.  Cardiovascular: Positive for leg swelling.  GI: Gastrointestinal negative.  Musculoskeletal: Positive for gait problem.  Skin: Skin negative.  Hematologic: Hematologic/lymphatic negative.  Psychiatric: Psychiatric negative.        Objective:  Objective   Vitals:   12/31/17 1122  BP: (!) 85/56  Pulse: 69  Resp: 20  Temp: (!) 97.3 F (36.3 C)  SpO2: 98%  Weight: 246 lb (111.6 kg)  Height: 6' (1.829 m)   Body mass index is 33.36 kg/m.  Physical Exam  Constitutional: He appears well-developed.  HENT:  Head: Normocephalic.  Eyes: Pupils are equal, round, and reactive to light.  Neck: Normal range of motion.  Cardiovascular: Normal rate.  Pulses:      Radial pulses are 2+ on the right side, and 2+ on the left side.  Pulmonary/Chest: Effort normal.    Abdominal: Soft.  Musculoskeletal: Normal range of motion. He exhibits edema.  Neurological: He is alert.  Skin: Skin is warm and dry. Capillary refill takes less than 2 seconds.  Psychiatric: He has a normal mood and affect. His behavior is normal. Judgment and thought content normal.    Data: I reviewed his report of outside DVT study which demonstrated extensive left lower extremity DVT from the left common femoral vein to the popliteal vein     Assessment/Plan:     84 year old male presents for evaluation left lower extremity swelling after having extensive left lower extremity DVT.  He has moderate postphlebitic syndrome at this point.  Given that he does not walk I would not recommend any intervention.  He needs to continue Xarelto at least 6 months but longer if he can tolerate I think would be better.  Aspirin lifelong after that at least.  He needs gentle compression at least knee-high to prevent any skin changes or ulceration.  He should also elevate the leg when recumbent as he spends approximately 22 hours a day in the bed should be able to get good elevation of the leg.  He can follow-up on an as-needed basis.     Maeola HarmanBrandon Christopher Alera Quevedo MD [facility]

## 2018-01-30 ENCOUNTER — Inpatient Hospital Stay (HOSPITAL_COMMUNITY): Payer: Medicare Other

## 2018-01-30 ENCOUNTER — Inpatient Hospital Stay (HOSPITAL_COMMUNITY)
Admission: EM | Admit: 2018-01-30 | Discharge: 2018-02-07 | DRG: 988 | Disposition: A | Discharge status: Skilled Nursing Facility | Payer: Medicare Other | Attending: Family Medicine | Admitting: Family Medicine

## 2018-01-30 ENCOUNTER — Emergency Department (HOSPITAL_COMMUNITY): Payer: Medicare Other

## 2018-01-30 ENCOUNTER — Encounter (HOSPITAL_COMMUNITY): Payer: Self-pay

## 2018-01-30 ENCOUNTER — Other Ambulatory Visit: Payer: Self-pay

## 2018-01-30 DIAGNOSIS — Z7901 Long term (current) use of anticoagulants: Secondary | ICD-10-CM | POA: Diagnosis not present

## 2018-01-30 DIAGNOSIS — Z95828 Presence of other vascular implants and grafts: Secondary | ICD-10-CM

## 2018-01-30 DIAGNOSIS — M5410 Radiculopathy, site unspecified: Secondary | ICD-10-CM | POA: Diagnosis present

## 2018-01-30 DIAGNOSIS — Z886 Allergy status to analgesic agent status: Secondary | ICD-10-CM | POA: Diagnosis not present

## 2018-01-30 DIAGNOSIS — R8281 Pyuria: Secondary | ICD-10-CM | POA: Diagnosis present

## 2018-01-30 DIAGNOSIS — B9561 Methicillin susceptible Staphylococcus aureus infection as the cause of diseases classified elsewhere: Secondary | ICD-10-CM | POA: Diagnosis not present

## 2018-01-30 DIAGNOSIS — M4804 Spinal stenosis, thoracic region: Secondary | ICD-10-CM | POA: Diagnosis present

## 2018-01-30 DIAGNOSIS — G061 Intraspinal abscess and granuloma: Principal | ICD-10-CM | POA: Diagnosis present

## 2018-01-30 DIAGNOSIS — R32 Unspecified urinary incontinence: Secondary | ICD-10-CM | POA: Diagnosis present

## 2018-01-30 DIAGNOSIS — I4891 Unspecified atrial fibrillation: Secondary | ICD-10-CM | POA: Diagnosis present

## 2018-01-30 DIAGNOSIS — E11649 Type 2 diabetes mellitus with hypoglycemia without coma: Secondary | ICD-10-CM | POA: Diagnosis present

## 2018-01-30 DIAGNOSIS — I959 Hypotension, unspecified: Secondary | ICD-10-CM

## 2018-01-30 DIAGNOSIS — M4625 Osteomyelitis of vertebra, thoracolumbar region: Secondary | ICD-10-CM | POA: Diagnosis present

## 2018-01-30 DIAGNOSIS — E1122 Type 2 diabetes mellitus with diabetic chronic kidney disease: Secondary | ICD-10-CM | POA: Diagnosis present

## 2018-01-30 DIAGNOSIS — S90821A Blister (nonthermal), right foot, initial encounter: Secondary | ICD-10-CM | POA: Diagnosis present

## 2018-01-30 DIAGNOSIS — Z888 Allergy status to other drugs, medicaments and biological substances status: Secondary | ICD-10-CM

## 2018-01-30 DIAGNOSIS — G2581 Restless legs syndrome: Secondary | ICD-10-CM | POA: Diagnosis present

## 2018-01-30 DIAGNOSIS — I129 Hypertensive chronic kidney disease with stage 1 through stage 4 chronic kidney disease, or unspecified chronic kidney disease: Secondary | ICD-10-CM | POA: Diagnosis present

## 2018-01-30 DIAGNOSIS — M4645 Discitis, unspecified, thoracolumbar region: Secondary | ICD-10-CM | POA: Diagnosis not present

## 2018-01-30 DIAGNOSIS — I951 Orthostatic hypotension: Secondary | ICD-10-CM | POA: Diagnosis present

## 2018-01-30 DIAGNOSIS — F0280 Dementia in other diseases classified elsewhere without behavioral disturbance: Secondary | ICD-10-CM | POA: Diagnosis present

## 2018-01-30 DIAGNOSIS — Z6835 Body mass index (BMI) 35.0-35.9, adult: Secondary | ICD-10-CM

## 2018-01-30 DIAGNOSIS — I361 Nonrheumatic tricuspid (valve) insufficiency: Secondary | ICD-10-CM | POA: Diagnosis not present

## 2018-01-30 DIAGNOSIS — D638 Anemia in other chronic diseases classified elsewhere: Secondary | ICD-10-CM | POA: Diagnosis present

## 2018-01-30 DIAGNOSIS — Z79890 Hormone replacement therapy (postmenopausal): Secondary | ICD-10-CM

## 2018-01-30 DIAGNOSIS — G9589 Other specified diseases of spinal cord: Secondary | ICD-10-CM | POA: Diagnosis present

## 2018-01-30 DIAGNOSIS — R55 Syncope and collapse: Secondary | ICD-10-CM | POA: Diagnosis present

## 2018-01-30 DIAGNOSIS — N183 Chronic kidney disease, stage 3 (moderate): Secondary | ICD-10-CM | POA: Diagnosis present

## 2018-01-30 DIAGNOSIS — I9589 Other hypotension: Secondary | ICD-10-CM | POA: Diagnosis not present

## 2018-01-30 DIAGNOSIS — M62830 Muscle spasm of back: Secondary | ICD-10-CM | POA: Diagnosis not present

## 2018-01-30 DIAGNOSIS — G8314 Monoplegia of lower limb affecting left nondominant side: Secondary | ICD-10-CM | POA: Diagnosis present

## 2018-01-30 DIAGNOSIS — Z8249 Family history of ischemic heart disease and other diseases of the circulatory system: Secondary | ICD-10-CM

## 2018-01-30 DIAGNOSIS — E039 Hypothyroidism, unspecified: Secondary | ICD-10-CM | POA: Diagnosis present

## 2018-01-30 DIAGNOSIS — I482 Chronic atrial fibrillation, unspecified: Secondary | ICD-10-CM | POA: Diagnosis present

## 2018-01-30 DIAGNOSIS — D509 Iron deficiency anemia, unspecified: Secondary | ICD-10-CM | POA: Diagnosis present

## 2018-01-30 DIAGNOSIS — Z8619 Personal history of other infectious and parasitic diseases: Secondary | ICD-10-CM

## 2018-01-30 DIAGNOSIS — E861 Hypovolemia: Secondary | ICD-10-CM

## 2018-01-30 DIAGNOSIS — Z7401 Bed confinement status: Secondary | ICD-10-CM

## 2018-01-30 DIAGNOSIS — E785 Hyperlipidemia, unspecified: Secondary | ICD-10-CM | POA: Diagnosis present

## 2018-01-30 DIAGNOSIS — R4689 Other symptoms and signs involving appearance and behavior: Secondary | ICD-10-CM

## 2018-01-30 DIAGNOSIS — R159 Full incontinence of feces: Secondary | ICD-10-CM | POA: Diagnosis present

## 2018-01-30 DIAGNOSIS — N3281 Overactive bladder: Secondary | ICD-10-CM | POA: Diagnosis present

## 2018-01-30 DIAGNOSIS — Z96641 Presence of right artificial hip joint: Secondary | ICD-10-CM | POA: Diagnosis not present

## 2018-01-30 DIAGNOSIS — Z86718 Personal history of other venous thrombosis and embolism: Secondary | ICD-10-CM

## 2018-01-30 DIAGNOSIS — F419 Anxiety disorder, unspecified: Secondary | ICD-10-CM | POA: Diagnosis present

## 2018-01-30 DIAGNOSIS — E118 Type 2 diabetes mellitus with unspecified complications: Secondary | ICD-10-CM | POA: Diagnosis present

## 2018-01-30 DIAGNOSIS — G062 Extradural and subdural abscess, unspecified: Secondary | ICD-10-CM | POA: Diagnosis not present

## 2018-01-30 DIAGNOSIS — G309 Alzheimer's disease, unspecified: Secondary | ICD-10-CM | POA: Diagnosis present

## 2018-01-30 DIAGNOSIS — X58XXXA Exposure to other specified factors, initial encounter: Secondary | ICD-10-CM | POA: Diagnosis present

## 2018-01-30 DIAGNOSIS — I480 Paroxysmal atrial fibrillation: Secondary | ICD-10-CM | POA: Diagnosis present

## 2018-01-30 DIAGNOSIS — E119 Type 2 diabetes mellitus without complications: Secondary | ICD-10-CM | POA: Diagnosis not present

## 2018-01-30 DIAGNOSIS — M1990 Unspecified osteoarthritis, unspecified site: Secondary | ICD-10-CM | POA: Diagnosis present

## 2018-01-30 DIAGNOSIS — G8929 Other chronic pain: Secondary | ICD-10-CM | POA: Diagnosis present

## 2018-01-30 DIAGNOSIS — M4624 Osteomyelitis of vertebra, thoracic region: Secondary | ICD-10-CM | POA: Diagnosis not present

## 2018-01-30 DIAGNOSIS — E1169 Type 2 diabetes mellitus with other specified complication: Secondary | ICD-10-CM | POA: Diagnosis present

## 2018-01-30 DIAGNOSIS — Z885 Allergy status to narcotic agent status: Secondary | ICD-10-CM | POA: Diagnosis not present

## 2018-01-30 DIAGNOSIS — Z981 Arthrodesis status: Secondary | ICD-10-CM

## 2018-01-30 DIAGNOSIS — Z792 Long term (current) use of antibiotics: Secondary | ICD-10-CM | POA: Diagnosis not present

## 2018-01-30 DIAGNOSIS — Z8661 Personal history of infections of the central nervous system: Secondary | ICD-10-CM

## 2018-01-30 DIAGNOSIS — E876 Hypokalemia: Secondary | ICD-10-CM | POA: Diagnosis present

## 2018-01-30 DIAGNOSIS — Z7984 Long term (current) use of oral hypoglycemic drugs: Secondary | ICD-10-CM

## 2018-01-30 DIAGNOSIS — Z79899 Other long term (current) drug therapy: Secondary | ICD-10-CM

## 2018-01-30 DIAGNOSIS — L8990 Pressure ulcer of unspecified site, unspecified stage: Secondary | ICD-10-CM

## 2018-01-30 DIAGNOSIS — G301 Alzheimer's disease with late onset: Secondary | ICD-10-CM | POA: Diagnosis not present

## 2018-01-30 DIAGNOSIS — Z993 Dependence on wheelchair: Secondary | ICD-10-CM

## 2018-01-30 DIAGNOSIS — D649 Anemia, unspecified: Secondary | ICD-10-CM

## 2018-01-30 LAB — COMPREHENSIVE METABOLIC PANEL
ALT: 7 U/L (ref 0–44)
AST: 11 U/L — ABNORMAL LOW (ref 15–41)
Albumin: 2.1 g/dL — ABNORMAL LOW (ref 3.5–5.0)
Alkaline Phosphatase: 50 U/L (ref 38–126)
Anion gap: 3 — ABNORMAL LOW (ref 5–15)
BUN: 20 mg/dL (ref 8–23)
CO2: 20 mmol/L — ABNORMAL LOW (ref 22–32)
Calcium: 6.5 mg/dL — ABNORMAL LOW (ref 8.9–10.3)
Chloride: 118 mmol/L — ABNORMAL HIGH (ref 98–111)
Creatinine, Ser: 1.11 mg/dL (ref 0.61–1.24)
GFR calc Af Amer: >60 mL/min (ref >60)
GFR calc non Af Amer: >60 mL/min (ref >60)
Glucose, Bld: 93 mg/dL (ref 70–99)
Potassium: 3.1 mmol/L — ABNORMAL LOW (ref 3.5–5.1)
Sodium: 141 mmol/L (ref 135–145)
Total Bilirubin: 0.4 mg/dL (ref 0.3–1.2)
Total Protein: 4.2 g/dL — ABNORMAL LOW (ref 6.5–8.1)

## 2018-01-30 LAB — CBC WITH DIFFERENTIAL/PLATELET
Abs Immature Granulocytes: 0.01 10*3/uL (ref 0.00–0.07)
Basophils Absolute: 0 10*3/uL (ref 0.0–0.1)
Basophils Relative: 1 %
Eosinophils Absolute: 0.2 10*3/uL (ref 0.0–0.5)
Eosinophils Relative: 5 %
HCT: 26.4 % — ABNORMAL LOW (ref 39.0–52.0)
Hemoglobin: 7.9 g/dL — ABNORMAL LOW (ref 13.0–17.0)
Immature Granulocytes: 0 %
Lymphocytes Relative: 20 %
Lymphs Abs: 0.8 10*3/uL (ref 0.7–4.0)
MCH: 30 pg (ref 26.0–34.0)
MCHC: 29.9 g/dL — ABNORMAL LOW (ref 30.0–36.0)
MCV: 100.4 fL — ABNORMAL HIGH (ref 80.0–100.0)
Monocytes Absolute: 0.5 10*3/uL (ref 0.1–1.0)
Monocytes Relative: 13 %
Neutro Abs: 2.5 10*3/uL (ref 1.7–7.7)
Neutrophils Relative %: 61 %
Platelets: DECREASED 10*3/uL (ref 150–400)
RBC: 2.63 MIL/uL — ABNORMAL LOW (ref 4.22–5.81)
RDW: 15 % (ref 11.5–15.5)
WBC: 4 10*3/uL (ref 4.0–10.5)
nRBC: 0 % (ref 0.0–0.2)

## 2018-01-30 LAB — GLUCOSE, CAPILLARY
Glucose-Capillary: 67 mg/dL — ABNORMAL LOW (ref 70–99)
Glucose-Capillary: 69 mg/dL — ABNORMAL LOW (ref 70–99)
Glucose-Capillary: 103 mg/dL — ABNORMAL HIGH (ref 70–99)

## 2018-01-30 LAB — POC OCCULT BLOOD, ED: Fecal Occult Bld: NEGATIVE

## 2018-01-30 LAB — I-STAT TROPONIN, ED: Troponin i, poc: 0.02 ng/mL (ref 0.00–0.08)

## 2018-01-30 LAB — BRAIN NATRIURETIC PEPTIDE: B Natriuretic Peptide: 158.7 pg/mL — ABNORMAL HIGH (ref 0.0–100.0)

## 2018-01-30 LAB — I-STAT CG4 LACTIC ACID, ED
Lactic Acid, Venous: 0.84 mmol/L (ref 0.5–1.9)
Lactic Acid, Venous: 1.22 mmol/L (ref 0.5–1.9)

## 2018-01-30 LAB — CBG MONITORING, ED: GLUCOSE-CAPILLARY: 103 mg/dL — AB (ref 70–99)

## 2018-01-30 LAB — TYPE AND SCREEN
ABO/RH(D): A POS
Antibody Screen: NEGATIVE

## 2018-01-30 LAB — TROPONIN I: Troponin I: 0.03 ng/mL (ref <0.03)

## 2018-01-30 MED ORDER — LACTULOSE 10 GM/15ML PO SOLN
30.0000 g | Freq: Every day | ORAL | Status: DC
  Administered 2018-01-31 – 2018-02-07 (×7): 30 g via ORAL
  Filled 2018-01-30 (×7): qty 45

## 2018-01-30 MED ORDER — LEVOTHYROXINE SODIUM 88 MCG PO TABS
88.0000 ug | ORAL_TABLET | Freq: Every day | ORAL | Status: DC
  Administered 2018-01-31 – 2018-02-07 (×8): 88 ug via ORAL
  Filled 2018-01-30 (×8): qty 1

## 2018-01-30 MED ORDER — ESCITALOPRAM OXALATE 10 MG PO TABS
15.0000 mg | ORAL_TABLET | Freq: Every day | ORAL | Status: DC
  Administered 2018-01-31 – 2018-02-07 (×7): 15 mg via ORAL
  Filled 2018-01-30 (×8): qty 2

## 2018-01-30 MED ORDER — LIDOCAINE 5 % EX PTCH
1.0000 | MEDICATED_PATCH | CUTANEOUS | Status: DC
  Administered 2018-01-31 – 2018-02-07 (×7): 1 via TRANSDERMAL
  Filled 2018-01-30 (×8): qty 1

## 2018-01-30 MED ORDER — INSULIN ASPART 100 UNIT/ML ~~LOC~~ SOLN: 0.0000 [IU] | Freq: Every day | SUBCUTANEOUS | Status: DC

## 2018-01-30 MED ORDER — PRAVASTATIN SODIUM 40 MG PO TABS
40.0000 mg | ORAL_TABLET | Freq: Every day | ORAL | Status: DC
  Administered 2018-01-30 – 2018-02-06 (×8): 40 mg via ORAL
  Filled 2018-01-30 (×8): qty 1

## 2018-01-30 MED ORDER — FINASTERIDE 5 MG PO TABS
5.0000 mg | ORAL_TABLET | Freq: Every day | ORAL | Status: DC
  Administered 2018-01-30 – 2018-02-06 (×8): 5 mg via ORAL
  Filled 2018-01-30 (×8): qty 1

## 2018-01-30 MED ORDER — ROPINIROLE HCL 1 MG PO TABS
0.5000 mg | ORAL_TABLET | Freq: Every day | ORAL | Status: DC
  Administered 2018-01-30 – 2018-02-06 (×8): 0.5 mg via ORAL
  Filled 2018-01-30 (×8): qty 1

## 2018-01-30 MED ORDER — SODIUM CHLORIDE 0.9 % IV SOLN
INTRAVENOUS | Status: DC
  Administered 2018-01-30: 23:00:00 via INTRAVENOUS

## 2018-01-30 MED ORDER — DIVALPROEX SODIUM 125 MG PO CSDR
125.0000 mg | DELAYED_RELEASE_CAPSULE | Freq: Every day | ORAL | Status: DC
  Administered 2018-01-30 – 2018-02-06 (×8): 125 mg via ORAL
  Filled 2018-01-30 (×8): qty 1

## 2018-01-30 MED ORDER — ALBUTEROL SULFATE (2.5 MG/3ML) 0.083% IN NEBU: 2.5000 mg | INHALATION_SOLUTION | Freq: Four times a day (QID) | RESPIRATORY_TRACT | Status: DC | PRN

## 2018-01-30 MED ORDER — ALPRAZOLAM 0.25 MG PO TABS: 0.2500 mg | ORAL_TABLET | Freq: Two times a day (BID) | ORAL | Status: DC | PRN

## 2018-01-30 MED ORDER — INSULIN ASPART 100 UNIT/ML ~~LOC~~ SOLN: 0.0000 [IU] | Freq: Three times a day (TID) | SUBCUTANEOUS | Status: DC

## 2018-01-30 MED ORDER — SODIUM CHLORIDE 0.9 % IV BOLUS
1000.0000 mL | Freq: Once | INTRAVENOUS | Status: AC
  Administered 2018-01-30: 1000 mL via INTRAVENOUS

## 2018-01-30 MED ORDER — RIVAROXABAN 20 MG PO TABS
20.0000 mg | ORAL_TABLET | Freq: Every day | ORAL | Status: DC
  Administered 2018-01-31: 20 mg via ORAL
  Filled 2018-01-30: qty 1

## 2018-01-30 MED ORDER — PROMETHAZINE HCL 25 MG PO TABS: 12.5000 mg | ORAL_TABLET | Freq: Four times a day (QID) | ORAL | Status: DC | PRN

## 2018-01-30 MED ORDER — DOCUSATE SODIUM 100 MG PO CAPS
100.0000 mg | ORAL_CAPSULE | Freq: Two times a day (BID) | ORAL | Status: DC
  Administered 2018-01-30 – 2018-02-07 (×15): 100 mg via ORAL
  Filled 2018-01-30 (×15): qty 1

## 2018-01-30 MED ORDER — PIOGLITAZONE HCL 15 MG PO TABS
15.0000 mg | ORAL_TABLET | Freq: Every day | ORAL | Status: DC
  Administered 2018-01-31: 15 mg via ORAL
  Filled 2018-01-30 (×4): qty 1

## 2018-01-30 MED ORDER — SODIUM CHLORIDE 0.9% FLUSH
3.0000 mL | Freq: Two times a day (BID) | INTRAVENOUS | Status: DC
  Administered 2018-01-30 – 2018-02-07 (×15): 3 mL via INTRAVENOUS

## 2018-01-30 MED ORDER — LACTULOSE 10 GM/15ML PO SOLN
10.0000 g | Freq: Every day | ORAL | Status: DC
  Administered 2018-01-30 – 2018-02-06 (×8): 10 g via ORAL
  Filled 2018-01-30 (×9): qty 15

## 2018-01-30 MED ORDER — DIVALPROEX SODIUM 250 MG PO DR TAB: 250.0000 mg | DELAYED_RELEASE_TABLET | Freq: Two times a day (BID) | ORAL | Status: DC

## 2018-01-30 MED ORDER — FERROUS SULFATE 325 (65 FE) MG PO TABS
325.0000 mg | ORAL_TABLET | Freq: Every day | ORAL | Status: DC
  Administered 2018-01-31 – 2018-02-07 (×8): 325 mg via ORAL
  Filled 2018-01-30 (×11): qty 1

## 2018-01-30 MED ORDER — LIDOCAINE 4 % EX PTCH: 1.0000 | MEDICATED_PATCH | Freq: Two times a day (BID) | CUTANEOUS | Status: DC

## 2018-01-30 MED ORDER — HYDROCODONE-ACETAMINOPHEN 5-325 MG PO TABS: 1.0000 | ORAL_TABLET | Freq: Two times a day (BID) | ORAL | Status: DC | PRN

## 2018-01-30 MED ORDER — TIZANIDINE HCL 4 MG PO TABS
4.0000 mg | ORAL_TABLET | Freq: Four times a day (QID) | ORAL | Status: DC | PRN
  Administered 2018-02-01: 4 mg via ORAL
  Filled 2018-01-30 (×4): qty 1

## 2018-01-30 MED ORDER — MAGNESIUM HYDROXIDE 400 MG/5ML PO SUSP: 30.0000 mL | Freq: Two times a day (BID) | ORAL | Status: DC | PRN

## 2018-01-30 NOTE — ED Triage Notes (Signed)
Per EMS pt was c/o chest heaviness and AMS at [facility] place. Upon arrival pt was pale and hypotensive 72/40. Received NS. bp 89/54. CBG 165. Pt A&Ox1 but baseline is A&OX4

## 2018-01-30 NOTE — H&P (Signed)
History and Physical   Isadore Civil VZD:[id number] DOB: 29-Jul-1930 DOA: 01/30/2018  Referring MD/NP/PA: Michela Pitcher, PA  PCP: Malka So., MD   Outpatient Specialists: None   Patient coming from: ALF  Chief Complaint: Passing out  HPI: [name] is a 85 y.o. male with medical history significant of atrial fibrillation status post previous pacemaker placement which is removed, recurrent MSSA infection, chronic kidney disease stage III, history of DVT, history of Alzheimer's dementia, history of depression hyperlipidemia diabetes and hypertension who lives in the assisted living facility at [facility] and was brought in due to sudden onset of confusion and passing out.  Patient has apparently been having these episodes of confusion on and off for a while.  Today however he had syncopal episode.  When EMS arrived blood pressure was 72/40.  He received up to 700 cc bolus of normal saline and brought to the ER where he remained hypotensive with systolic in the 80s.  He received couple of liters of IV fluid and systolic is up to 120 at the moment.  Patient has reportedly been nauseated in the last couple of days has not been eating or drinking adequately.  No diarrhea no vomiting.  He has some chest discomfort with this.  But throughout the week his confusion has progressively worsened.  Patient is therefore being diagnosed with syncopal episode hypotension and possible arrhythmia.  He is still in atrial fibrillation but the rate is controlled and he is fully anticoagulated..  ED Course: Initial blood pressure was 85/61 in the ER with a temperature 97.5 pulse 107 respiratory 21 oxygen sat 79% on room air he is currently 100% on 2 L.  White count is 4.0 hemoglobin 7.9 and platelets are clumped.  He has sodium 141 potassium 3.0 chloride 118 and CO2 20 BUN 20 creatinine 1.1 and calcium 6.5 with troponin 0 0.03.  Lactic acid was 1.22 fecal occult blood was negative x2.  Head CT without contrast is  negative and chest x-ray showed no significant findings.  Review of Systems: As per HPI otherwise 10 point review of systems negative.    Past Medical History:  Diagnosis Date  . Alzheimer's type dementia (HCC) 12/07/2016  . Anemia   . Anxiety   . Arthritis   . Bacteremia   . Bradycardia    a. PPM removed in 2014 due to bacteremia.  . Chronic atrial fibrillation   . Chronic right shoulder pain 06/17/2015  . CKD (chronic kidney disease), stage III (HCC)   . Complication of anesthesia    " Terrible Nightmares"  . Constipation   . Depression   . Diabetes mellitus without complication (HCC)    Type II  . DVT (deep venous thrombosis) (HCC)    a. in 2019 - pt reports behind knee - was on Xarelto 15mg  BID now 20mg  daily.  . Encounter for removal of cardiac resynchronization therapy pacemaker 2017  . Grief 06/17/2015  . History of blood product transfusion   . Hypertension   . Hypothyroidism   . Infected pacemaker (HCC)   . Major depression 06/17/2015  . MSSA (methicillin susceptible Staphylococcus aureus) infection   . OAB (overactive bladder)   . Overactive bladder   . Septic hip (HCC)   . Septicemia (HCC)   . Severe depression (HCC) 12/07/2016  . Staphylococcus aureus infection   . Thoracic spine pain 11/28/2014    Past Surgical History:  Procedure Laterality Date  . COLONOSCOPY W/ POLYPECTOMY     small  .  EYE SURGERY     bilateral  . HIP CLOSED REDUCTION Right 10/02/2012   Procedure: CLOSED REDUCTION HIP;  Surgeon: Velna OchsPeter G Dalldorf, MD;  Location: [facility] OR;  Service: Orthopedics;  Laterality: Right;  . ICD LEAD REMOVAL Left 08/31/2012   Procedure: ICD LEAD REMOVAL;  Surgeon: Marinus MawGregg W Taylor, MD;  [facility];  Service: Cardiovascular;  Laterality: Left;  . INCISION AND DRAINAGE HIP Right 09/02/2012   Procedure: IRRIGATION AND DEBRIDEMENT RIGHT HIP;  Surgeon: Nestor LewandowskyFrank J Rowan, MD;  Location: [facility] OR;  Service: Orthopedics;  Laterality: Right;  . INCISION AND DRAINAGE HIP Right  08/10/2014   Procedure: IRRIGATION AND DEBRIDEMENT RIGHT HIP SUPERFICIAL VS DEEP;  Surgeon: Gean BirchwoodFrank Rowan, MD;  Location: [facility] OR;  Service: Orthopedics;  Laterality: Right;  . INSERT / REPLACE / REMOVE PACEMAKER     removed in 2017 d/t sepsis/notes 11/03/2017  . JOINT REPLACEMENT    . LUMBAR LAMINECTOMY/DECOMPRESSION MICRODISCECTOMY  02/14/2012   Procedure: LUMBAR LAMINECTOMY/DECOMPRESSION MICRODISCECTOMY 1 LEVEL;  Surgeon: Tia Alertavid S Jones, MD;  Location: [facility] NEURO ORS;  Service: Neurosurgery;  Laterality: N/A;  Thoracic twelve - Lumbar one decompressive laminectomy.  Marland Kitchen. RADIOLOGY WITH ANESTHESIA N/A 01/10/2015   Procedure: MRI LUMBAR SPINE WITH/WITHOUT   (RADIOLOGY WITH ANESTHESIA);  Surgeon: Medication Radiologist, MD;  Location: [facility] OR;  Service: Radiology;  Laterality: N/A;  . TEE WITHOUT CARDIOVERSION N/A 08/30/2012   Procedure: TRANSESOPHAGEAL ECHOCARDIOGRAM (TEE);  Surgeon: Laurey Moralealton S McLean, MD;  [facility];  Service: Cardiovascular;  Laterality: N/A;  . TONSILLECTOMY    . TOTAL HIP REVISION Right 09/02/2012   Procedure: REVISION OF POLYETHYLENE LINER AND FEMORAL HEAD ;  Surgeon: Nestor LewandowskyFrank J Rowan, MD;  Location: [facility] OR;  Service: Orthopedics;  Laterality: Right;  . TOTAL HIP REVISION Right 11/28/2012   Procedure: TOTAL HIP REVISION With Placement of Contrain Liner;  Surgeon: Nestor LewandowskyFrank J Rowan, MD;  Location: [facility] OR;  Service: Orthopedics;  Laterality: Right;     reports that he has never smoked. He has never used smokeless tobacco. He reports that he does not drink alcohol or use drugs.  Allergies  Allergen Reactions  . Ace Inhibitors Other (See Comments)    Cough Cough  Cough  . Gabapentin   . Nsaids Other (See Comments)    Renal insuff Other reaction(s): Other (See Comments) Renal insuff Renal insuff   . Zolpidem Other (See Comments)    hallucinations Nightmares, irritable, nervouse Disorientation    Family History  Problem Relation Age of Onset  . Heart failure Mother       Prior to Admission medications   Medication Sig Start Date End Date Taking? Authorizing Provider  albuterol (PROVENTIL) (2.5 MG/3ML) 0.083% nebulizer solution  12/17/17   [provider]  ALPRAZolam (XANAX) 0.25 MG tablet Take 1 tablet (0.25 mg total) by mouth 2 (two) times daily as needed for anxiety (do not give if Drowsiness is present). 11/09/17   Jerald Kiefhiu, Stephen K, MD  divalproex (DEPAKOTE) 125 MG DR tablet  12/21/17   [provider]  docusate sodium (COLACE) 100 MG capsule Take 100 mg by mouth 2 (two) times daily.     [provider]  escitalopram (LEXAPRO) 10 MG tablet Take 15 mg by mouth daily.  10/17/17   [provider]  ferrous sulfate 325 (65 FE) MG tablet Take 325 mg by mouth daily with breakfast.    [provider]  finasteride (PROSCAR) 5 MG tablet Take 5 mg by mouth at bedtime.  [provider]  HYDROcodone-acetaminophen (NORCO/VICODIN) 5-325 MG tablet Take 1 tablet by mouth every 12 (twelve) hours as needed for moderate pain. 11/09/17   Jerald Kief, MD  lactulose (CHRONULAC) 10 GM/15ML solution Take 10-30 g by mouth See admin instructions. Take 45 mls by mouth every am and 15 mls every evening for constipation Hold for loose stool    [provider]  levothyroxine (SYNTHROID, LEVOTHROID) 88 MCG tablet Take 88 mcg by mouth daily before breakfast.    [provider]  Lidocaine (ASPERCREME LIDOCAINE) 4 % PTCH Apply 1 patch topically every 12 (twelve) hours. Apply one patch between shoulder and elbow on both sides each day for 12 hrs AM and PM off    [provider]  lovastatin (MEVACOR) 40 MG tablet Take 40 mg by mouth at bedtime.  07/09/14   [provider]  magnesium hydroxide (MILK OF MAGNESIA) 400 MG/5ML suspension Take 30 mLs by mouth 2 (two) times daily as needed for mild constipation.    [provider]  pioglitazone (ACTOS) 15 MG tablet Take 15 mg by mouth daily.     [provider]  promethazine (PHENERGAN) 25 MG tablet  12/14/17   [provider]  rOPINIRole (REQUIP) 0.5 MG tablet Take 0.5 mg by mouth at bedtime.  10/18/17   [provider]  telmisartan (MICARDIS) 40 MG tablet Take 40 mg by mouth daily.    [provider]  terazosin (HYTRIN) 5 MG capsule Take 5 mg by mouth at bedtime.  11/15/12   [provider]  tiZANidine (ZANAFLEX) 2 MG tablet  12/03/17   [provider]  torsemide (DEMADEX) 10 MG tablet Take 10 mg by mouth daily.     [provider]  XARELTO 20 MG TABS tablet Take 20 mg by mouth daily.  10/18/17   [provider]    Physical Exam: Vitals:   01/30/18 1845 01/30/18 1900 01/30/18 1915 01/30/18 1930  BP: 119/82 (!) 129/93 (!) 148/99 114/90  Pulse: (!) 47 (!) 58 70 87  Resp: 12 14 15  (!) 21  Temp:      TempSrc:      SpO2: 100% 100% 99% 95%  Weight:      Height:          Constitutional: NAD, calm, comfortable Vitals:   01/30/18 1845 01/30/18 1900 01/30/18 1915 01/30/18 1930  BP: 119/82 (!) 129/93 (!) 148/99 114/90  Pulse: (!) 47 (!) 58 70 87  Resp: 12 14 15  (!) 21  Temp:      TempSrc:      SpO2: 100% 100% 99% 95%  Weight:      Height:       Eyes: PERRL, lids and conjunctivae normal ENMT: Mucous membranes are moist. Posterior pharynx clear of any exudate or lesions.Normal dentition.  Neck: normal, supple, no masses, no thyromegaly Respiratory: clear to auscultation bilaterally, no wheezing, no crackles. Normal respiratory effort. No accessory muscle use.  Cardiovascular: Irregularly irregular rate and rhythm, no murmurs / rubs / gallops. No extremity edema. 2+ pedal pulses. No carotid bruits.  Abdomen: no tenderness, no masses palpated. No hepatosplenomegaly. Bowel sounds positive.  Musculoskeletal: no clubbing / cyanosis. No joint deformity upper and lower extremities. Good ROM, no contractures. Normal muscle tone.  Skin: no rashes, lesions, ulcers.  No induration Neurologic: CN 2-12 grossly intact. Sensation intact, DTR normal. Strength 5/5 in all 4.  Psychiatric: Normal judgment and insight. Alert and oriented x 3. Normal mood.  Labs on Admission: I have personally reviewed following labs and imaging studies  CBC: Recent Labs  Lab 01/30/18 1620  WBC 4.0  NEUTROABS 2.5  HGB 7.9*  HCT 26.4*  MCV 100.4*  PLT PLATELET CLUMPS NOTED ON SMEAR, COUNT APPEARS DECREASED   Basic Metabolic Panel: Recent Labs  Lab 01/30/18 1620  NA 141  K 3.1*  CL 118*  CO2 20*  GLUCOSE 93  BUN 20  CREATININE 1.11  CALCIUM 6.5*   GFR: Estimated Creatinine Clearance: 67.6 mL/min (by C-G formula based on SCr of 1.11 mg/dL). Liver Function Tests: Recent Labs  Lab 01/30/18 1620  AST 11*  ALT 7  ALKPHOS 50  BILITOT 0.4  PROT 4.2*  ALBUMIN 2.1*   No results for input(s): LIPASE, AMYLASE in the last 168 hours. No results for input(s): AMMONIA in the last 168 hours. Coagulation Profile: No results for input(s): INR, PROTIME in the last 168 hours. Cardiac Enzymes: No results for input(s): CKTOTAL, CKMB, CKMBINDEX, TROPONINI in the last 168 hours. BNP (last 3 results) No results for input(s): PROBNP in the last 8760 hours. HbA1C: No results for input(s): HGBA1C in the last 72 hours. CBG: Recent Labs  Lab 01/30/18 1609  GLUCAP 103*   Lipid Profile: No results for input(s): CHOL, HDL, LDLCALC, TRIG, CHOLHDL, LDLDIRECT in the last 72 hours. Thyroid Function Tests: No results for input(s): TSH, T4TOTAL, FREET4, T3FREE, THYROIDAB in the last 72 hours. Anemia Panel: No results for input(s): VITAMINB12, FOLATE, FERRITIN, TIBC, IRON, RETICCTPCT in the last 72 hours. Urine analysis:    Component Value Date/Time   COLORURINE YELLOW 11/03/2017 0341   APPEARANCEUR HAZY (A) 11/03/2017 0341   LABSPEC 1.012 11/03/2017 0341   PHURINE 8.0 11/03/2017 0341   GLUCOSEU NEGATIVE 11/03/2017 0341   HGBUR SMALL (A) 11/03/2017 0341   BILIRUBINUR  NEGATIVE 11/03/2017 0341   KETONESUR NEGATIVE 11/03/2017 0341   PROTEINUR NEGATIVE 11/03/2017 0341   UROBILINOGEN 0.2 12/30/2012 2015   NITRITE NEGATIVE 11/03/2017 0341   LEUKOCYTESUR MODERATE (A) 11/03/2017 0341   Sepsis Labs: @LABRCNTIP (procalcitonin:4,lacticidven:4) )No results found for this or any previous visit (from the past 240 hour(s)).   Radiological Exams on Admission: Ct Head Wo Contrast  Result Date: 01/30/2018 CLINICAL DATA:  Chest heaviness and altered mental status today. EXAM: CT HEAD WITHOUT CONTRAST TECHNIQUE: Contiguous axial images were obtained from the base of the skull through the vertex without intravenous contrast. COMPARISON:  Head CT 11/03/2017.  Brain MRI 11/04/2017. FINDINGS: Brain: No evidence of acute infarction, hemorrhage, hydrocephalus, extra-axial collection or mass lesion/mass effect. Atrophy and chronic microvascular ischemic change noted. Vascular: Atherosclerosis is seen. Skull: Intact.  No focal lesion. Sinuses/Orbits: Scattered ethmoid air cell disease and mild mucosal thickening in the maxillary sinuses noted. The patient is status post cataract surgery. Other: None. IMPRESSION: No acute abnormality. Atrophy and chronic microvascular ischemic change. Mild maxillary sinus and ethmoid air cell disease. Atherosclerosis. Electronically Signed   By: Drusilla Kannerhomas  Dalessio M.D.   On: 01/30/2018 17:29   Dg Chest Port 1 View  Result Date: 01/30/2018 CLINICAL DATA:  Chest heaviness. Altered mental status. Hypotensive. History of bowel sign wrist disease. EXAM: PORTABLE CHEST 1 VIEW COMPARISON:  Chest radiographs October 25, 2017 FINDINGS: Stable cardiomegaly. Calcified aortic arch. Similar fullness of the pulmonary hila. No pleural effusion or focal consolidation in this low inspiratory examination with crowded vascular markings. No pneumothorax. Multiple EKG lines overlie the patient and may obscure subtle underlying pathology. Included soft tissue planes and osseous  structures are non  suspicious. IMPRESSION: 1. Stable cardiomegaly. No acute pulmonary process for this low inspiratory examination. 2.  Aortic Atherosclerosis (ICD10-I70.0). Electronically Signed   By: Awilda Metro M.D.   On: 01/30/2018 17:13    EKG: Independently reviewed.  It shows atrial fibrillation with left bundle branch block.  No significant change from previous.  Assessment/Plan Principal Problem:   Hypotension Active Problems:   Atrial fibrillation (HCC)   Hypothyroidism   Type 2 diabetes mellitus with complication (HCC)   Other chronic pain   Alzheimer's type dementia (HCC)   Syncope     #1 syncope: Patient was hypotensive.  This could be hypovolemic orthostatic syncope.  Patient's baseline is wheelchair-bound.  He has received multiple liters of fluids.  With his cardiac history this could also be due to arrhythmias.  Patient could also have had recurrent TIAs especially since he has history of atrial fibrillation.  At this point will get MRI of the brain and echocardiogram.  Monitor on telemetry.  Gentle hydration.  May consider consultation with cardiology in the morning.  #2 hypotension: most likely secondary to poor oral intake.  Continue to replete IV fluids.  #3 hypokalemia: Potassium was low.  Replete potassium and check magnesium level.  #4 diabetes: Initiate sliding scale insulin with home regimen.  #5 Alzheimer's dementia: Patient is at baseline.  #6 hypothyroidism: Check TSH and continue levothyroxine  #7 atrial fibrillation: continue with Xarelto and rate control.  #8 anemia: [name] is 7.9 but is guaiac negative.  No evidence of congestive heart failure but patient may require blood transfusion if hemoglobin further drops especially with hydration.  This could be playing a role especially if he is close to 7 g.    DVT prophylaxis: Xarelto Code Status: Full code Family Communication: 2 daughters at bedside Disposition Plan: To be  determined Consults called: None at the moment Admission status: Inpatient  Severity of Illness: The appropriate patient status for this patient is INPATIENT. Inpatient status is judged to be reasonable and necessary in order to provide the required intensity of service to ensure the patient's safety. The patient's presenting symptoms, physical exam findings, and initial radiographic and laboratory data in the context of their chronic comorbidities is felt to place them at high risk for further clinical deterioration. Furthermore, it is not anticipated that the patient will be medically stable for discharge from the hospital within 2 midnights of admission. The following factors support the patient status of inpatient.   " The patient's presenting symptoms include passing out. " The worrisome physical exam findings include frail but no acute distress. " The initial radiographic and laboratory data are worrisome because of no significant findings. " The chronic co-morbidities include history of atrial fibrillation.   * I certify that at the point of admission it is my clinical judgment that the patient will require inpatient hospital care spanning beyond 2 midnights from the point of admission due to high intensity of service, high risk for further deterioration and high frequency of surveillance required.Lonia Blood MD [facility] Pager [phone]  If 7PM-7AM, please contact night-coverage [email or web address] Password [facility]  01/30/2018, 7:33 PM

## 2018-01-30 NOTE — Progress Notes (Signed)
CRITICAL VALUE ALERT  Critical Value: Troponin 0.03  Date & Time Notied:  01/30/2018 2130  Provider Notified: X. Blount, NP  Orders Received/Actions taken: No orders given at this time

## 2018-01-30 NOTE — ED Provider Notes (Signed)
[name] EMERGENCY DEPARTMENT Provider Note   CSN: [id number] Arrival date & time: 01/30/18  1549     History   Chief Complaint Chief Complaint  Patient presents with  . Hypotension    HPI [name] is a 85 y.o. male with history of Alzheimer's type dementia, chronic A. fib, CKD, DVT, depression, septic hip, hyperlipidemia, hypertension, diabetes mellitus presents sent from [facility] for evaluation of altered mental status.  Per EMS report the patient complained of chest heaviness earlier today and then began to behave somewhat abnormally with hyper-religious sentiments. He was found to be pale and hypotensive upon EMS arrival with blood pressure 72/40.  He received 700 mL normal saline bolus with EMS with improvement in his blood pressure to 89/54.  He appears somewhat confused and cannot tell me why he is in the hospital.  He is denying headaches, chest pain, abdominal pain, nausea, or vomiting.  He does state "I feel like I am having a hard time talking, it's taking longer to say the words".   Family at the bedside states that he has had similar episodes complaining of chest pain with decreased level of consciousness/responsiveness 3 times this week.  They also note that he has been more confused than usual this week.  He is wheelchair-bound at baseline. Currently anticoagulated on xarelto.   The history is provided by the patient.    Past Medical History:  Diagnosis Date  . Alzheimer's type dementia (HCC) 12/07/2016  . Anemia   . Anxiety   . Arthritis   . Bacteremia   . Bradycardia    a. PPM removed in 2014 due to bacteremia.  . Chronic atrial fibrillation   . Chronic right shoulder pain 06/17/2015  . CKD (chronic kidney disease), stage III (HCC)   . Complication of anesthesia    " Terrible Nightmares"  . Constipation   . Depression   . Diabetes mellitus without complication (HCC)    Type II  . DVT (deep venous thrombosis) (HCC)    a. in 2019  - pt reports behind knee - was on Xarelto 15mg  BID now 20mg  daily.  . Encounter for removal of cardiac resynchronization therapy pacemaker 2017  . Grief 06/17/2015  . History of blood product transfusion   . Hypertension   . Hypothyroidism   . Infected pacemaker (HCC)   . Major depression 06/17/2015  . MSSA (methicillin susceptible Staphylococcus aureus) infection   . OAB (overactive bladder)   . Overactive bladder   . Septic hip (HCC)   . Septicemia (HCC)   . Severe depression (HCC) 12/07/2016  . Staphylococcus aureus infection   . Thoracic spine pain 11/28/2014    Patient Active Problem List   Diagnosis Date Noted  . Hypotension 01/30/2018  . Syncope 01/30/2018  . Facial droop 11/04/2017  . Left arm weakness 11/04/2017  . Symptomatic bradycardia 11/03/2017  . Bradycardia   . Transient alteration of awareness   . Bilateral leg weakness 06/27/2017  . Depression 12/07/2016  . Alzheimer's type dementia (HCC) 12/07/2016  . Anxiety 01/11/2016  . Other chronic pain 06/17/2015  . Grief 06/17/2015  . Major depression 06/17/2015  . Type 2 diabetes mellitus with complication (HCC)   . Discitis 01/10/2015  . Osteomyelitis of thoracic spine (HCC) 01/10/2015  . Thoracic spine pain 11/28/2014  . Elevated PSA 11/08/2014  . Prosthetic hip infection (HCC) 02/16/2013  . MSSA (methicillin susceptible Staphylococcus aureus) infection   . Infected pacemaker (HCC)   . Closed dislocation  of right hip (HCC) 10/02/2012    Class: Acute  . Retroperitoneal fluid collection 08/28/2012  . Staphylococcus aureus bacteremia 08/27/2012  . Hypokalemia 08/26/2012  . DM type 2 (diabetes mellitus, type 2) (HCC) 08/26/2012  . Thrombocytopenia (HCC) 03/21/2012  . Hypothyroidism 02/14/2012  . Hyperlipidemia LDL goal <100 02/14/2012  . Benign prostatic hyperplasia 02/14/2012  . Lower extremity weakness 02/11/2012  . Anemia in chronic kidney disease 02/11/2012  . Chronic kidney disease, stage III (moderate)  (HCC) 02/11/2012  . Atrial fibrillation (HCC) 02/11/2012  . Spinal stenosis 02/11/2012  . Hip dislocation, right (HCC) 11/30/2010  . Essential hypertension 11/30/2010    Past Surgical History:  Procedure Laterality Date  . COLONOSCOPY W/ POLYPECTOMY     small  . EYE SURGERY     bilateral  . HIP CLOSED REDUCTION Right 10/02/2012   Procedure: CLOSED REDUCTION HIP;  Surgeon: Velna OchsPeter G Dalldorf, MD;  Location: [facility] OR;  Service: Orthopedics;  Laterality: Right;  . ICD LEAD REMOVAL Left 08/31/2012   Procedure: ICD LEAD REMOVAL;  Surgeon: Marinus MawGregg W Taylor, MD;  [facility];  Service: Cardiovascular;  Laterality: Left;  . INCISION AND DRAINAGE HIP Right 09/02/2012   Procedure: IRRIGATION AND DEBRIDEMENT RIGHT HIP;  Surgeon: Nestor LewandowskyFrank J Rowan, MD;  Location: [facility] OR;  Service: Orthopedics;  Laterality: Right;  . INCISION AND DRAINAGE HIP Right 08/10/2014   Procedure: IRRIGATION AND DEBRIDEMENT RIGHT HIP SUPERFICIAL VS DEEP;  Surgeon: Gean BirchwoodFrank Rowan, MD;  Location: [facility] OR;  Service: Orthopedics;  Laterality: Right;  . INSERT / REPLACE / REMOVE PACEMAKER     removed in 2017 d/t sepsis/notes 11/03/2017  . JOINT REPLACEMENT    . LUMBAR LAMINECTOMY/DECOMPRESSION MICRODISCECTOMY  02/14/2012   Procedure: LUMBAR LAMINECTOMY/DECOMPRESSION MICRODISCECTOMY 1 LEVEL;  Surgeon: Tia Alertavid S Jones, MD;  Location: [facility] NEURO ORS;  Service: Neurosurgery;  Laterality: N/A;  Thoracic twelve - Lumbar one decompressive laminectomy.  Marland Kitchen. RADIOLOGY WITH ANESTHESIA N/A 01/10/2015   Procedure: MRI LUMBAR SPINE WITH/WITHOUT   (RADIOLOGY WITH ANESTHESIA);  Surgeon: Medication Radiologist, MD;  Location: [facility] OR;  Service: Radiology;  Laterality: N/A;  . TEE WITHOUT CARDIOVERSION N/A 08/30/2012   Procedure: TRANSESOPHAGEAL ECHOCARDIOGRAM (TEE);  Surgeon: Laurey Moralealton S McLean, MD;  [facility];  Service: Cardiovascular;  Laterality: N/A;  . TONSILLECTOMY    . TOTAL HIP REVISION Right 09/02/2012   Procedure: REVISION OF POLYETHYLENE LINER AND FEMORAL  HEAD ;  Surgeon: Nestor LewandowskyFrank J Rowan, MD;  Location: [facility] OR;  Service: Orthopedics;  Laterality: Right;  . TOTAL HIP REVISION Right 11/28/2012   Procedure: TOTAL HIP REVISION With Placement of Contrain Liner;  Surgeon: Nestor LewandowskyFrank J Rowan, MD;  Location: [facility] OR;  Service: Orthopedics;  Laterality: Right;        Home Medications    Prior to Admission medications   Medication Sig Start Date End Date Taking? Authorizing Provider  acetaminophen (TYLENOL) 500 MG tablet Take 500 mg by mouth 3 (three) times daily.   Yes [provider]  Artificial Saliva (BIOTENE MOISTURIZING MOUTH MT) Use as directed 2 sprays in the mouth or throat See admin instructions. Apply 2 sprays directly onto mouth and tongue. Spread thoroughly inside the mouth - every 6 hours as needed for dry mouth.   Yes [provider]  buPROPion (WELLBUTRIN SR) 100 MG 12 hr tablet Take 100 mg by mouth 2 (two) times daily.   Yes [provider]  divalproex (DEPAKOTE SPRINKLE) 125 MG capsule Take 125 mg by mouth at bedtime.   Yes [provider]  docusate sodium (COLACE) 100 MG capsule Take 100 mg by mouth 2 (two) times daily.    Yes [provider]  escitalopram (LEXAPRO) 10 MG tablet Take 10 mg by mouth daily.  10/17/17  Yes [provider]  ferrous sulfate 325 (65 FE) MG tablet Take 325 mg by mouth 2 (two) times daily with a meal.    Yes [provider]  finasteride (PROSCAR) 5 MG tablet Take 5 mg by mouth at bedtime.    Yes [provider]  lactulose (CHRONULAC) 10 GM/15ML solution Take 10-30 g by mouth See admin instructions. Take 45 mls (30 g) by mouth every morning, take 15 mls (10 g) at bedtime. Hold for loose stool.   Yes [provider]  levothyroxine (SYNTHROID, LEVOTHROID) 88 MCG tablet Take 88 mcg by mouth daily before breakfast.   Yes [provider]  Lidocaine (ASPERCREME LIDOCAINE) 4 % PTCH Apply 3 patches topically See admin instructions. Apply one  patch to left arm between shoulder and elbow every morning, remove every night (12 hrs later); apply 2nd patch between shoulder blades every morning, remove every night (12 hrs later); apply 3rd patch to right arm between shoulder and elbow every morning, remover every night (12 hrs later)   Yes [provider]  lovastatin (MEVACOR) 40 MG tablet Take 40 mg by mouth at bedtime.  07/09/14  Yes [provider]  nystatin (NYSTATIN) powder Apply topically See admin instructions. Apply powder to skin fold rash twice daily until resolved   Yes [provider]  pioglitazone (ACTOS) 15 MG tablet Take 15 mg by mouth daily.   Yes [provider]  rivaroxaban (XARELTO) 20 MG TABS tablet Take 20 mg by mouth daily.   Yes [provider]  rOPINIRole (REQUIP) 0.5 MG tablet Take 0.5 mg by mouth at bedtime.  10/18/17  Yes [provider]  telmisartan (MICARDIS) 40 MG tablet Take 40 mg by mouth daily.   Yes [provider]  terazosin (HYTRIN) 5 MG capsule Take 5 mg by mouth at bedtime.  11/15/12  Yes [provider]  tiZANidine (ZANAFLEX) 2 MG tablet Take 2 mg by mouth 3 (three) times daily.  12/03/17  Yes [provider]  torsemide (DEMADEX) 10 MG tablet Take 10 mg by mouth daily.    Yes [provider]  vitamin C (ASCORBIC ACID) 500 MG tablet Take 500 mg by mouth 2 (two) times daily.   Yes [provider]  ALPRAZolam (XANAX) 0.25 MG tablet Take 1 tablet (0.25 mg total) by mouth 2 (two) times daily as needed for anxiety (do not give if Drowsiness is present). Patient not taking: Reported on 01/30/2018 11/09/17   Jerald Kief, MD  HYDROcodone-acetaminophen (NORCO/VICODIN) 5-325 MG tablet Take 1 tablet by mouth every 12 (twelve) hours as needed for moderate pain. Patient not taking: Reported on 01/30/2018 11/09/17   Jerald Kief, MD    Family History Family History  Problem Relation Age of Onset  . Heart failure Mother      Social History Social History   Tobacco Use  . Smoking status: Never Smoker  . Smokeless tobacco: Never Used  Substance Use Topics  . Alcohol use: No    Alcohol/week: 0.0 standard drinks  . Drug use: No     Allergies   Ace inhibitors; Gabapentin; Nsaids; and Zolpidem   Review of Systems Review of Systems  Constitutional: Negative for chills and fever.  Respiratory: Negative for shortness of breath.  Cardiovascular: Positive for chest pain.  Gastrointestinal: Negative for abdominal pain, nausea and vomiting.  Psychiatric/Behavioral: Positive for confusion.  All other systems reviewed and are negative.    Physical Exam Updated Vital Signs BP 118/72 (BP Location: Right Arm)   Pulse 71   Temp 98 F (36.7 C) (Oral)   Resp 18   Ht 6' (1.829 m)   Wt 111.2 kg   SpO2 (!) 75%   BMI 33.25 kg/m   Physical Exam Vitals signs and nursing note reviewed. Exam conducted with a chaperone present.  Constitutional:      General: He is not in acute distress.    Appearance: He is well-developed.  HENT:     Head: Normocephalic and atraumatic.     Mouth/Throat:     Mouth: Mucous membranes are dry.  Eyes:     General:        Right eye: No discharge.        Left eye: No discharge.     Conjunctiva/sclera: Conjunctivae normal.  Neck:     Vascular: No JVD.     Trachea: No tracheal deviation.  Cardiovascular:     Comments: Irregularly irregular rhythm.  1+ radial and DP/PT pulses bilaterally, lower extremity edema Pulmonary:     Effort: Pulmonary effort is normal.     Breath sounds: Normal breath sounds.  Chest:     Chest wall: No tenderness.  Abdominal:     General: Bowel sounds are normal. There is no distension.     Tenderness: There is no guarding or rebound.  Genitourinary:    Rectum: Guaiac result negative.     Comments: Moderate amount of black stool in the rectal vault  Musculoskeletal:     Comments: Diminished strength of the BLE major muscle groups; patient  reports this is chronic and unchanged.  4/5 strength of BUE major muscle groups.  Skin:    General: Skin is dry.     Coloration: Skin is pale.     Findings: No erythema.  Neurological:     Mental Status: He is alert.     Comments: Fluent speech, no dysarthria or aphasia.  No facial droop.  Cranial nerves II through XII tested and intact.  Sensation intact to soft touch of extremities.  Psychiatric:        Behavior: Behavior normal.      ED Treatments / Results  Labs (all labs ordered are listed, but only abnormal results are displayed) Labs Reviewed  COMPREHENSIVE METABOLIC PANEL - Abnormal; Notable for the following components:      Result Value   Potassium 3.1 (*)    Chloride 118 (*)    CO2 20 (*)    Calcium 6.5 (*)    Total Protein 4.2 (*)    Albumin 2.1 (*)    AST 11 (*)    Anion gap 3 (*)    All other components within normal limits  CBC WITH DIFFERENTIAL/PLATELET - Abnormal; Notable for the following components:   RBC 2.63 (*)    Hemoglobin 7.9 (*)    HCT 26.4 (*)    MCV 100.4 (*)    MCHC 29.9 (*)    All other components within normal limits  BRAIN NATRIURETIC PEPTIDE - Abnormal; Notable for the following components:   B Natriuretic Peptide 158.7 (*)    All other components within normal limits  TROPONIN I - Abnormal; Notable for the following components:   Troponin I 0.03 (*)    All other components within normal  limits  GLUCOSE, CAPILLARY - Abnormal; Notable for the following components:   Glucose-Capillary 67 (*)    All other components within normal limits  GLUCOSE, CAPILLARY - Abnormal; Notable for the following components:   Glucose-Capillary 69 (*)    All other components within normal limits  CBG MONITORING, ED - Abnormal; Notable for the following components:   Glucose-Capillary 103 (*)    All other components within normal limits  CULTURE, BLOOD (ROUTINE X 2)  CULTURE, BLOOD (ROUTINE X 2)  URINALYSIS, ROUTINE W REFLEX MICROSCOPIC  TSH  TROPONIN I   TROPONIN I  COMPREHENSIVE METABOLIC PANEL  CBC  GLUCOSE, CAPILLARY  I-STAT CG4 LACTIC ACID, ED  I-STAT TROPONIN, ED  POC OCCULT BLOOD, ED  I-STAT CG4 LACTIC ACID, ED  TYPE AND SCREEN    EKG EKG Interpretation  Date/Time:  Sunday January 30 2018 16:05:15 EST Ventricular Rate:  66 PR Interval:    QRS Duration: 121 QT Interval:  440 QTC Calculation: 461 R Axis:   -2 Text Interpretation:  Atrial fibrillation Left bundle branch block No significant change since last tracing Confirmed by Linwood Dibbles [id number]) on 01/30/2018 4:09:41 PM   Radiology Ct Head Wo Contrast  Result Date: 01/30/2018 CLINICAL DATA:  Chest heaviness and altered mental status today. EXAM: CT HEAD WITHOUT CONTRAST TECHNIQUE: Contiguous axial images were obtained from the base of the skull through the vertex without intravenous contrast. COMPARISON:  Head CT 11/03/2017.  Brain MRI 11/04/2017. FINDINGS: Brain: No evidence of acute infarction, hemorrhage, hydrocephalus, extra-axial collection or mass lesion/mass effect. Atrophy and chronic microvascular ischemic change noted. Vascular: Atherosclerosis is seen. Skull: Intact.  No focal lesion. Sinuses/Orbits: Scattered ethmoid air cell disease and mild mucosal thickening in the maxillary sinuses noted. The patient is status post cataract surgery. Other: None. IMPRESSION: No acute abnormality. Atrophy and chronic microvascular ischemic change. Mild maxillary sinus and ethmoid air cell disease. Atherosclerosis. Electronically Signed   By: Drusilla Kanner M.D.   On: 01/30/2018 17:29   Dg Chest Port 1 View  Result Date: 01/30/2018 CLINICAL DATA:  Chest heaviness. Altered mental status. Hypotensive. History of bowel sign wrist disease. EXAM: PORTABLE CHEST 1 VIEW COMPARISON:  Chest radiographs October 25, 2017 FINDINGS: Stable cardiomegaly. Calcified aortic arch. Similar fullness of the pulmonary hila. No pleural effusion or focal consolidation in this low inspiratory examination  with crowded vascular markings. No pneumothorax. Multiple EKG lines overlie the patient and may obscure subtle underlying pathology. Included soft tissue planes and osseous structures are non suspicious. IMPRESSION: 1. Stable cardiomegaly. No acute pulmonary process for this low inspiratory examination. 2.  Aortic Atherosclerosis (ICD10-I70.0). Electronically Signed   By: Awilda Metro M.D.   On: 01/30/2018 17:13    Procedures Procedures (including critical care time)  Medications Ordered in ED Medications  levothyroxine (SYNTHROID, LEVOTHROID) tablet 88 mcg (has no administration in time range)  pravastatin (PRAVACHOL) tablet 40 mg (has no administration in time range)  finasteride (PROSCAR) tablet 5 mg (has no administration in time range)  lactulose (CHRONULAC) 10 GM/15ML solution 30 g (has no administration in time range)  docusate sodium (COLACE) capsule 100 mg (has no administration in time range)  pioglitazone (ACTOS) tablet 15 mg (has no administration in time range)  escitalopram (LEXAPRO) tablet 15 mg (has no administration in time range)  rOPINIRole (REQUIP) tablet 0.5 mg (has no administration in time range)  rivaroxaban (XARELTO) tablet 20 mg (has no administration in time range)  ferrous sulfate tablet 325 mg (has no administration in time  range)  tiZANidine (ZANAFLEX) tablet 4 mg (has no administration in time range)  albuterol (PROVENTIL) (2.5 MG/3ML) 0.083% nebulizer solution 2.5 mg (has no administration in time range)  sodium chloride flush (NS) 0.9 % injection 3 mL (has no administration in time range)  insulin aspart (novoLOG) injection 0-15 Units (has no administration in time range)  insulin aspart (novoLOG) injection 0-5 Units (0 Units Subcutaneous Not Given 01/30/18 2223)  0.9 %  sodium chloride infusion (has no administration in time range)  lactulose (CHRONULAC) 10 GM/15ML solution 10 g (has no administration in time range)  lidocaine (LIDODERM) 5 % 1 patch (has  no administration in time range)  divalproex (DEPAKOTE SPRINKLE) capsule 125 mg (has no administration in time range)  sodium chloride 0.9 % bolus 1,000 mL (0 mLs Intravenous Stopped 01/30/18 1757)  sodium chloride 0.9 % bolus 1,000 mL (0 mLs Intravenous Stopped 01/30/18 1932)     Initial Impression / Assessment and Plan / ED Course  I have reviewed the triage vital signs and the nursing notes.  Pertinent labs & imaging results that were available during my care of the patient were reviewed by me and considered in my medical decision making (see chart for details).     Patient presenting for evaluation of chest pains and altered behavior.  He is afebrile, hypotensive in the ED with some improvement after fluid bolus.  He is alert and oriented x4 but appears confused as to the events leading up to his presentation to the ED.  EKG shows atrial fibrillation with no significant changes from last tracing and initial troponin is negative.  Chest x-ray shows no acute cardiopulmonary abnormalities.  Head CT shows no acute intracranial abnormality.  No evidence of CVA.  Labs reviewed by me shows mild hypokalemia and hyperchloremia.  His hemoglobin has dropped 2 g in 2 months but his point-of-care Hemoccult is negative.  Given confusion and hypotension, feel he would benefit from admission for further evaluation and management.  With complicated cardiac history and complaints of chest pains, may have a component of arrhythmia or sick sinus syndrome contributing to his symptoms.  Spoke with Dr. Hanley Hays with [facility] hospitalist service who agrees to assume care of patient and bring him into the hospital for further evaluation and management.  Patient seen and evaluated by Dr. Lynelle Doctor who agrees with assessment and plan at this time.  Final Clinical Impressions(s) / ED Diagnoses   Final diagnoses:  Hypotension, unspecified hypotension type  Altered behavior  Anemia, unspecified type    ED Discharge Orders    None         Bennye Alm 01/30/18 2235    Linwood Dibbles, MD 01/31/18 1236

## 2018-01-30 NOTE — ED Notes (Signed)
Pt. Back from CT.

## 2018-01-30 NOTE — ED Notes (Signed)
Attempted to call report, RN will return to call to # provided

## 2018-01-31 ENCOUNTER — Inpatient Hospital Stay (HOSPITAL_COMMUNITY): Payer: Medicare Other

## 2018-01-31 ENCOUNTER — Other Ambulatory Visit (HOSPITAL_COMMUNITY): Payer: Medicare Other

## 2018-01-31 DIAGNOSIS — E118 Type 2 diabetes mellitus with unspecified complications: Secondary | ICD-10-CM

## 2018-01-31 DIAGNOSIS — G301 Alzheimer's disease with late onset: Secondary | ICD-10-CM

## 2018-01-31 LAB — URINALYSIS, ROUTINE W REFLEX MICROSCOPIC
Bilirubin Urine: NEGATIVE
Glucose, UA: NEGATIVE mg/dL
Hgb urine dipstick: NEGATIVE
KETONES UR: NEGATIVE mg/dL
Nitrite: POSITIVE — AB
Protein, ur: 30 mg/dL — AB
Specific Gravity, Urine: 1.015 (ref 1.005–1.030)
WBC, UA: >50 WBC/hpf — ABNORMAL HIGH (ref 0–5)
pH: 5 (ref 5.0–8.0)

## 2018-01-31 LAB — COMPREHENSIVE METABOLIC PANEL
ALBUMIN: 2.9 g/dL — AB (ref 3.5–5.0)
ALT: 9 U/L (ref 0–44)
AST: 15 U/L (ref 15–41)
Alkaline Phosphatase: 63 U/L (ref 38–126)
Anion gap: 7 (ref 5–15)
BUN: 18 mg/dL (ref 8–23)
CO2: 22 mmol/L (ref 22–32)
Calcium: 8.4 mg/dL — ABNORMAL LOW (ref 8.9–10.3)
Chloride: 111 mmol/L (ref 98–111)
Creatinine, Ser: 1.14 mg/dL (ref 0.61–1.24)
GFR calc Af Amer: >60 mL/min (ref >60)
GFR calc non Af Amer: 58 mL/min — ABNORMAL LOW (ref >60)
Glucose, Bld: 91 mg/dL (ref 70–99)
Potassium: 4.1 mmol/L (ref 3.5–5.1)
SODIUM: 140 mmol/L (ref 135–145)
Total Bilirubin: 0.6 mg/dL (ref 0.3–1.2)
Total Protein: 5.8 g/dL — ABNORMAL LOW (ref 6.5–8.1)

## 2018-01-31 LAB — GLUCOSE, CAPILLARY
GLUCOSE-CAPILLARY: 89 mg/dL (ref 70–99)
Glucose-Capillary: 101 mg/dL — ABNORMAL HIGH (ref 70–99)
Glucose-Capillary: 95 mg/dL (ref 70–99)
Glucose-Capillary: 90 mg/dL (ref 70–99)
Glucose-Capillary: 92 mg/dL (ref 70–99)

## 2018-01-31 LAB — CBC
HCT: 35 % — ABNORMAL LOW (ref 39.0–52.0)
Hemoglobin: 10.8 g/dL — ABNORMAL LOW (ref 13.0–17.0)
MCH: 29.9 pg (ref 26.0–34.0)
MCHC: 30.9 g/dL (ref 30.0–36.0)
MCV: 97 fL (ref 80.0–100.0)
NRBC: 0 % (ref 0.0–0.2)
Platelets: 157 10*3/uL (ref 150–400)
RBC: 3.61 MIL/uL — ABNORMAL LOW (ref 4.22–5.81)
RDW: 14.9 % (ref 11.5–15.5)
WBC: 7.5 10*3/uL (ref 4.0–10.5)

## 2018-01-31 LAB — TROPONIN I
Troponin I: 0.03 ng/mL (ref <0.03)
Troponin I: 0.04 ng/mL (ref <0.03)

## 2018-01-31 LAB — TSH: TSH: 1.613 u[IU]/mL (ref 0.350–4.500)

## 2018-01-31 MED ORDER — LORAZEPAM 2 MG/ML IJ SOLN
0.5000 mg | Freq: Once | INTRAMUSCULAR | Status: AC | PRN
  Administered 2018-01-31: 0.5 mg via INTRAVENOUS
  Filled 2018-01-31: qty 1

## 2018-01-31 MED ORDER — GADOBUTROL 1 MMOL/ML IV SOLN
10.0000 mL | Freq: Once | INTRAVENOUS | Status: AC | PRN
  Administered 2018-01-31: 10 mL via INTRAVENOUS

## 2018-01-31 MED ORDER — ACETAMINOPHEN 325 MG PO TABS
650.0000 mg | ORAL_TABLET | Freq: Four times a day (QID) | ORAL | Status: DC | PRN
  Administered 2018-01-31 – 2018-02-07 (×11): 650 mg via ORAL
  Filled 2018-01-31 (×11): qty 2

## 2018-01-31 NOTE — Evaluation (Signed)
Physical Therapy Evaluation Patient Details Name: [name] MRN: [id number] DOB: [birth year] Today's Date: 01/31/2018   History of Present Illness  85yo male brought to the ED due to acute confusion and syncope. BP found to be 72/40, SpO2 79% on room air. Head CT negative. Admitted for hypotension and syncope. PMH dementia, hx PPM removal secondary to bacteremia, A-fib, CKD, DM, HTN, thoracic pain, closed hip reduction, lumbar discectomy, total hip revision   Clinical Impression   Patient received in bed, pleasant and willing to participate in PT today. Daughter present and confirms that he is currently WC/bed bound and cannot bear weight on either leg due to weakness; he is currently in long term care at [facility] and they are planning to return there at time of DC. He requires MaxA and Mod cues for rolling for placement of bed pan, will require +2 for further mobility and potential transfer via [facility] lift to chair as per his baseline level of function. Family asking if he can be seen by Neuro to clarify why he has lost so much strength in his LEs/almost presents like spinal cord injury, PT to contact attending MD about this. He was left in bed on bedpan, bed alarm active all other needs met this morning. Recommend ongoing PT in the acute setting as well as return to [facility] for ongoing skilled PT services and care.     Follow Up Recommendations SNF(return to [facility]/continue PT there )    Equipment Recommendations  Other (comment)(defer to next venue )    Recommendations for Other Services       Precautions / Restrictions Precautions Precautions: Fall;Other (comment) Precaution Comments: bed bound and WC bound at baseline, no feeling and inconsistent motor in B LEs, severe UE weakness  Restrictions Weight Bearing Restrictions: No      Mobility  Bed Mobility Overal bed mobility: Needs Assistance Bed Mobility: Rolling Rolling: Max assist         General bed  mobility comments: rolling MaxA, Mod cues for sequencing   Transfers                 General transfer comment: deferred- will require +2 for hoyer   Ambulation/Gait             General Gait Details: unable at baseline   Stairs            Wheelchair Mobility    Modified Rankin (Stroke Patients Only)       Balance                                             Pertinent Vitals/Pain Pain Assessment: No/denies pain    Home Living Family/patient expects to be discharged to:: Skilled nursing facility                 Additional Comments: [place]     Prior Function Level of Independence: Needs assistance   Gait / Transfers Assistance Needed: now bedbound/power WC bound, transfers to and from [facility] with hoyer lift            Hand Dominance   Dominant Hand: Right    Extremity/Trunk Assessment   Upper Extremity Assessment Upper Extremity Assessment: Defer to OT evaluation    Lower Extremity Assessment Lower Extremity Assessment: RLE deficits/detail;LLE deficits/detail RLE Deficits / Details: no active movement in B LEs at  time of eval  RLE Sensation: decreased light touch;decreased proprioception RLE Coordination: decreased fine motor;decreased gross motor LLE Deficits / Details: no active movement in B LEs at time of eval  LLE Sensation: decreased light touch;decreased proprioception LLE Coordination: decreased fine motor;decreased gross motor    Cervical / Trunk Assessment Cervical / Trunk Assessment: Kyphotic  Communication   Communication: No difficulties  Cognition Arousal/Alertness: Awake/alert Behavior During Therapy: WFL for tasks assessed/performed Overall Cognitive Status: Within Functional Limits for tasks assessed                                        General Comments General comments (skin integrity, edema, etc.): did not get to EOB for balance asssesment due to need for +2 assist      Exercises     Assessment/Plan    PT Assessment Patient needs continued PT services  PT Problem List Decreased strength;Decreased balance;Impaired tone;Decreased mobility;Decreased activity tolerance;Decreased coordination;Decreased safety awareness;Impaired sensation       PT Treatment Interventions DME instruction;Balance training;Patient/family education;Functional mobility training;Gait training;Therapeutic activities;Neuromuscular re-education;Radiographer, therapeutic;Therapeutic exercise;Manual techniques    PT Goals (Current goals can be found in the Care Plan section)  Acute Rehab PT Goals Patient Stated Goal: go back to [facility]/continue rehab there  PT Goal Formulation: With patient/family Time For Goal Achievement: 02/14/18 Potential to Achieve Goals: Good    Frequency Min 2X/week   Barriers to discharge        Co-evaluation               AM-PAC PT "6 Clicks" Mobility  Outcome Measure Help needed turning from your back to your side while in a flat bed without using bedrails?: A Lot Help needed moving from lying on your back to sitting on the side of a flat bed without using bedrails?: Total Help needed moving to and from a bed to a chair (including a wheelchair)?: Total Help needed standing up from a chair using your arms (e.g., wheelchair or bedside chair)?: Total Help needed to walk in hospital room?: Total Help needed climbing 3-5 steps with a railing? : Total 6 Click Score: 7    End of Session   Activity Tolerance: Patient tolerated treatment well Patient left: in bed;with call bell/phone within reach;with family/visitor present   PT Visit Diagnosis: Muscle weakness (generalized) (M62.81);Other abnormalities of gait and mobility (R26.89);Other symptoms and signs involving the nervous system (R29.898)    Time: 1275-1700 PT Time Calculation (min) (ACUTE ONLY): 17 min   Charges:   PT Evaluation $PT Eval High Complexity: 1  High           Deniece Ree PT, DPT, CBIS  Supplemental Physical Therapist [place]    Pager [phone] Acute Rehab Office [phone]

## 2018-01-31 NOTE — Clinical Social Work Note (Signed)
Clinical Social Work Assessment  Patient Details  Name: [name] MRN: [id number] Date of Birth: 1930-06-30  Date of referral:  01/31/18               Reason for consult:  Facility Placement                Permission sought to share information with:    Permission granted to share information::  Yes, Verbal Permission Granted  Name::     [name]  Agency::  [facility]  Relationship::  Spouse  Contact Information:     Housing/Transportation Living arrangements for the past 2 months:  Skilled Building surveyor of Information:  Spouse Patient Interpreter Needed:  None Criminal Activity/Legal Involvement Pertinent to Current Situation/Hospitalization:  No - Comment as needed Significant Relationships:  Spouse, Siblings Lives with:  Facility Resident Do you feel safe going back to the place where you live?  Yes Need for family participation in patient care:  No (Coment)  Care giving concerns:  Pt is only alert and oriented x2. CSW spoke with pt's spouse via telephone.   Social Worker assessment / plan:  CSW spoke with pt's spouse via telephone. Pt's spouse confirmed pt is from [facility] long term and the plan would be for pt to return at d/c. CSW to follow up with facility.  Employment status:  Retired Database administrator PT Recommendations:  Skilled Nursing Facility Information / Referral to community resources:  Skilled Nursing Facility  Patient/Family's Response to care:  Pt's spouse verbalized understanding of CSW role and expressed appreciation for support. Pt's spouse denies any concern regarding pt care at this time.   Patient/Family's Understanding of and Emotional Response to Diagnosis, Current Treatment, and Prognosis:  Pt's spouse understanding regarding pt's physical limitations. Pt's spouse agreeable for pt to return to SNF at d/c. Pt's spouse denies any further questions or concerns.  Emotional Assessment Appearance:  Appears stated  age Attitude/Demeanor/Rapport:  Unable to Assess Affect (typically observed):  Unable to Assess Orientation:  Oriented to Place, Oriented to Self Alcohol / Substance use:  Not Applicable Psych involvement (Current and /or in the community):  No (Comment)  Discharge Needs  Concerns to be addressed:  Care Coordination, Basic Needs Readmission within the last 30 days:  No Current discharge risk:  Dependent with Mobility Barriers to Discharge:  Continued Medical Work up   [employer], LCSW 01/31/2018, 2:00 PM

## 2018-01-31 NOTE — Evaluation (Signed)
Occupational Therapy Evaluation Patient Details Name: [name] MRN: [id number] DOB: 03/30/1930 Today's Date: 01/31/2018    History of Present Illness 85yo male brought to the ED due to acute confusion and syncope. BP found to be 72/40, SpO2 79% on room air. Head CT negative. Admitted for hypotension and syncope. PMH dementia, hx PPM removal secondary to bacteremia, A-fib, CKD, DM, HTN, thoracic pain, closed hip reduction, lumbar discectomy, total hip revision    Clinical Impression   Patient presenting with decreased coordination, sensation, balance, functional mobility/transfers, self care, and safety awareness.  Patient has been at [facility] for last 3-4 months  PTA. Staff assisted with self care and pt was bed/wheelchair bound and being transferred by hoyer lift at facility.  Patient currently functioning mod A - total A +2 . Patient will benefit from acute OT to increase overall independence in the areas of ADLs, functional mobility, ans safety in order to safely discharge to next venue of care.    Follow Up Recommendations  SNF    Equipment Recommendations  Other (comment)(defer to next venue of care)    Recommendations for Other Services Other (comment)(none at this time)     Precautions / Restrictions Precautions Precautions: Fall;Other (comment) Precaution Comments: bed bound and WC bound at baseline, no feeling and inconsistent motor in B LEs, severe UE weakness  Restrictions Weight Bearing Restrictions: No      Mobility Bed Mobility Overal bed mobility: Needs Assistance Bed Mobility: Rolling Rolling: Max assist         General bed mobility comments: mod A for cuing of technique  Transfers        General transfer comment: will require +2 hoyer/maxi transfer        ADL either performed or assessed with clinical judgement   ADL Overall ADL's : Needs assistance/impaired     Grooming: Wash/dry hands;Wash/dry face;Oral care;Brushing hair;Set up;Bed  level   Upper Body Bathing: Minimal assistance;Sitting   Lower Body Bathing: Total assistance;Bed level   Upper Body Dressing : Minimal assistance;Sitting   Lower Body Dressing: Total assistance;Bed level     Toilet Transfer Details (indicate cue type and reason): use of bed pan for safety Toileting- Clothing Manipulation and Hygiene: Total assistance          Vision Baseline Vision/History: Wears glasses Wears Glasses: Reading only              Pertinent Vitals/Pain Pain Assessment: No/denies pain     Hand Dominance Right   Extremity/Trunk Assessment Upper Extremity Assessment Upper Extremity Assessment: Generalized weakness   Lower Extremity Assessment Lower Extremity Assessment: Defer to PT evaluation RLE Deficits / Details: no active movement in B LEs at time of eval  RLE Sensation: decreased light touch;decreased proprioception RLE Coordination: decreased fine motor;decreased gross motor LLE Deficits / Details: no active movement in B LEs at time of eval  LLE Sensation: decreased light touch;decreased proprioception LLE Coordination: decreased fine motor;decreased gross motor   Cervical / Trunk Assessment Cervical / Trunk Assessment: Kyphotic   Communication Communication Communication: No difficulties   Cognition Arousal/Alertness: Awake/alert Behavior During Therapy: WFL for tasks assessed/performed Overall Cognitive Status: Within Functional Limits for tasks assessed       General Comments  did not get to EOB for balance asssesment due to need for +2 assist             Home Living Family/patient expects to be discharged to:: Skilled nursing facility       Additional [facility] - has been there for 3-4 months per daughter      Prior Functioning/Environment Level of Independence: Needs assistance  Gait / Transfers Assistance Needed: now bedbound/power WC bound, transfers to and from [facility] with hoyer lift  ADL's / Homemaking  Assistance Needed: Pt performed UB from wheelchair level and LB performed by staff at bed level            OT Problem List: Decreased strength;Impaired balance (sitting and/or standing);Decreased safety awareness;Decreased activity tolerance;Decreased knowledge of use of DME or AE      OT Treatment/Interventions: Self-care/ADL training;Visual/perceptual remediation/compensation;Manual therapy;Therapeutic exercise;Patient/family education;Neuromuscular education;Balance training;Energy conservation;Therapeutic activities    OT Goals(Current goals can be found in the care plan section) Acute Rehab OT Goals Patient Stated Goal: go back to [facility]/continue rehab there  OT Goal Formulation: With patient/family Time For Goal Achievement: 02/14/18 Potential to Achieve Goals: Fair ADL Goals Pt Will Perform Grooming: with set-up Pt Will Perform Upper Body Bathing: with set-up Pt Will Perform Lower Body Bathing: with mod assist Pt Will Perform Upper Body Dressing: with set-up Pt Will Perform Lower Body Dressing: with mod assist  OT Frequency: Min 2X/week   Barriers to D/C: Other (comment)  none known at this time          AM-PAC OT "6 Clicks" Daily Activity     Outcome Measure Help from another person eating meals?: None Help from another person taking care of personal grooming?: A Little Help from another person toileting, which includes using toliet, bedpan, or urinal?: Total Help from another person bathing (including washing, rinsing, drying)?: A Lot Help from another person to put on and taking off regular upper body clothing?: A Little Help from another person to put on and taking off regular lower body clothing?: Total 6 Click Score: 14   End of Session    Activity Tolerance: Patient tolerated treatment well Patient left: in bed;with call bell/phone within reach;with family/visitor present  OT Visit Diagnosis: Muscle weakness (generalized) (M62.81)                 Time: 1400-1416 OT Time Calculation (min): 16 min Charges:  OT General Charges $OT Visit: 1 Visit OT Evaluation $OT Eval Moderate Complexity: 1 Mod   Solace Wendorff P, MS, OTR/L 01/31/2018, 2:32 PM

## 2018-01-31 NOTE — Progress Notes (Signed)
PT Cancellation Note  Patient Details Name: Jerramy Furtado MRN: [id number] DOB: 16-Nov-1930   Cancelled Treatment:    Reason Eval/Treat Not Completed: Patient at procedure or test/unavailable attempted to work with patient, he was out of room and unavailable to participate with PT. Plan to attempt to return if time/schedule allow.    Nedra Hai PT, DPT, CBIS  Supplemental Physical Therapist [facility]    Pager [phone] Acute Rehab Office [phone]

## 2018-01-31 NOTE — Progress Notes (Signed)
PROGRESS NOTE  [name]  WGN:[id number] DOB: Oct 02, 1930 DOA: 01/30/2018 PCP: Malka SoJobe, Daniel B., MD   Brief Narrative: [name] is an 85 y.o. 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  Patient has apparently been having these episodes of confusion on and off for a while.  Today however he had syncopal episode.  When EMS arrived blood pressure was 72/40.  He received up to 700 cc bolus of normal saline and brought to the ER where he remained hypotensive with systolic in the 80s.  He received couple of liters of IV fluid and systolic is up to 120 at the moment.  Patient has reportedly been nauseated in the last couple of days has not been eating or drinking adequately.  No diarrhea no vomiting.  He has some chest discomfort with this.  But throughout the week his confusion has progressively worsened.  Patient is therefore being diagnosed with syncopal episode hypotension and possible arrhythmia.  He is still in atrial fibrillation but the rate is controlled and he is fully anticoagulated..  ED Course: Initial blood pressure was 85/61 in the ER with a temperature 97.5 pulse 107 respiratory 21 oxygen sat 79% on room air he is currently 100% on 2 L.  White count is 4.0 hemoglobin 7.9 and platelets are clumped.  He has sodium 141 potassium 3.0 chloride 118 and CO2 20 BUN 20 creatinine 1.1 and calcium 6.5 with troponin 0 0.03.  Lactic acid was 1.22 fecal occult blood was negative x2.  Head CT without contrast is negative and chest x-ray showed no significant findings.  Hospital Course: MRI brain showed no acute stroke. Echocardiogram is yet to be performed, though blood pressure has improved with fluids. No perfusion-limiting arrhythmias on  telemetry.   Assessment & Plan: Principal Problem:   Hypotension Active Problems:   Atrial fibrillation (HCC)   Hypothyroidism   Type 2 diabetes mellitus with complication (HCC)   Other chronic pain   Alzheimer's type dementia (HCC)   Syncope  Syncope: Possible orthostatic/hypotensive. Pt at very high risk for arrhythmia however.  - Continue telemetry monitoring - Echocardiogram pending.  - Continue IVF given poor po intake. - check AM cortisol  History of discitis, spinal (thoracolumbar) osteomyelitis and recurrent MSSA infection including right hip: Also h/o spinal fusion. Complaining of progressive weakness in legs worse recently with also now developing urinary and fecal incontinence, also having LE spasms L > R intermittently. Possibly RLS. Though deconditioning is currently contributing, this is concerning for spinal etiology. No clonus on exam, but pt essentially has no strength in legs.  - MRI T and L spine. Previously required sedation for MRI, though he tolerated MRI brain with ativan which we can repeat.  - continue ropinirole and tizanidine and iron supplementation.  Anemia of chronic disease and iron deficiency anemia: Reportedly has need transfusions in the past for this, no bleeding, FOBT negative x2.  - Monitor and transfuse prn symptoms or hgb 7g/dl - Continue iron supplementation  CKD stage III: CrCl >5760ml/min currently. Feel risk of gadolinium contrast outweighed by potential benefit of alternative diagnoses as listed above.  - Continue hydration tonight - Avoid nephrotoxins as able - Monitor BMP daily  T2DM:  - Continue SSI, pioglitazone  Hypothyroidism: TSH 1.613.  - Continue synthroid  Atrial fibrillation:  Rate controlled.  - Continue xarelto, telemetry  Hypokalemia:  - Replace and recheck  Alzheimer's dementia:  - Continue home regimen, needs supervisory care.   HLD:  - Continue statin  Morbid obesity: BMI 35 - Weight loss recommended  DVT  prophylaxis: Xarelto Code Status: Full Family Communication: Daughter at bedside Disposition Plan: SNF  Consultants:   None  Procedures:   None  Antimicrobials:  None   Subjective: No chest pain, dyspnea, lightheadedness. Very concerned about leg weakness. Lower back pain is stable, chronic, midline. Right hip does not currently cause any pain. No fevers.  Objective: Vitals:   01/30/18 2229 01/30/18 2300 01/31/18 0500 01/31/18 1753  BP: 118/72   113/68  Pulse: 71   84  Resp: 18     Temp: 98 F (36.7 C)   98 F (36.7 C)  TempSrc: Oral   Oral  SpO2: (!) 75% 95%  99%  Weight:   115.2 kg   Height:        Intake/Output Summary (Last 24 hours) at 01/31/2018 1824 Last data filed at 01/31/2018 1404 Gross per 24 hour  Intake 675 ml  Output 501 ml  Net 174 ml   Filed Weights   01/30/18 1631 01/30/18 2032 01/31/18 0500  Weight: 129.3 kg 111.2 kg 115.2 kg    Gen: Pale elderly male in no acute distress Pulm: Non-labored breathing. Clear to auscultation bilaterally.  CV: Irreg. No murmur, rub, or gallop. No JVD, trace pedal edema. GI: Abdomen soft, non-tender, non-distended, with normoactive bowel sounds. No organomegaly or masses felt. Ext: Warm, has left leg held in hip and knee flexion, externally rotated which spasms cause. Able to be straightened by examiner, not patient. Skin: No rashes, lesions or ulcers Neuro: Alert and somewhat oriented. 0/5 - 1/5 strength bilateral legs. No clonus bilaterally. Psych: Judgement and insight appear impaired by cognitive impairment. Mood & affect appropriate.   Data Reviewed: I have personally reviewed following labs and imaging studies  CBC: Recent Labs  Lab 01/30/18 1620 01/31/18 1143  WBC 4.0 7.5  NEUTROABS 2.5  --   HGB 7.9* 10.8*  HCT 26.4* 35.0*  MCV 100.4* 97.0  PLT PLATELET CLUMPS NOTED ON SMEAR, COUNT APPEARS DECREASED 157   Basic Metabolic Panel: Recent Labs  Lab 01/30/18 1620 01/31/18 1000  NA 141 140  K 3.1*  4.1  CL 118* 111  CO2 20* 22  GLUCOSE 93 91  BUN 20 18  CREATININE 1.11 1.14  CALCIUM 6.5* 8.4*   GFR: Estimated Creatinine Clearance: 61.2 mL/min (by C-G formula based on SCr of 1.14 mg/dL). Liver Function Tests: Recent Labs  Lab 01/30/18 1620 01/31/18 1000  AST 11* 15  ALT 7 9  ALKPHOS 50 63  BILITOT 0.4 0.6  PROT 4.2* 5.8*  ALBUMIN 2.1* 2.9*   No results for input(s): LIPASE, AMYLASE in the last 168 hours. No results for input(s): AMMONIA in the last 168 hours. Coagulation Profile: No results for input(s): INR, PROTIME in the last 168 hours. Cardiac Enzymes: Recent Labs  Lab 01/30/18 2045 01/31/18 0249 01/31/18 1000  TROPONINI 0.03* 0.03* 0.04*   BNP (last 3 results) No results for input(s): PROBNP in the last 8760 hours. HbA1C: No results for input(s): HGBA1C in the last 72 hours. CBG: Recent Labs  Lab 01/30/18 2147 01/31/18 0233 01/31/18 0735 01/31/18 1209 01/31/18 1753  GLUCAP 103* 101* 95 90 89   Lipid Profile: No results for input(s): CHOL, HDL, LDLCALC, TRIG, CHOLHDL, LDLDIRECT in the last 72 hours.  Thyroid Function Tests: Recent Labs    01/31/18 0249  TSH 1.613   Anemia Panel: No results for input(s): VITAMINB12, FOLATE, FERRITIN, TIBC, IRON, RETICCTPCT in the last 72 hours. Urine analysis:    Component Value Date/Time   COLORURINE YELLOW 11/03/2017 0341   APPEARANCEUR HAZY (A) 11/03/2017 0341   LABSPEC 1.012 11/03/2017 0341   PHURINE 8.0 11/03/2017 0341   GLUCOSEU NEGATIVE 11/03/2017 0341   HGBUR SMALL (A) 11/03/2017 0341   BILIRUBINUR NEGATIVE 11/03/2017 0341   KETONESUR NEGATIVE 11/03/2017 0341   PROTEINUR NEGATIVE 11/03/2017 0341   UROBILINOGEN 0.2 12/30/2012 2015   NITRITE NEGATIVE 11/03/2017 0341   LEUKOCYTESUR MODERATE (A) 11/03/2017 0341   Recent Results (from the past 240 hour(s))  Blood Culture (routine x 2)     Status: None (Preliminary result)   Collection Time: 01/30/18  4:27 PM  Result Value Ref Range Status    Specimen Description BLOOD LEFT ANTECUBITAL  Final   Special Requests   Final    BOTTLES DRAWN AEROBIC AND ANAEROBIC Blood Culture adequate volume Performed at [facility], [address] [place], [place] [address]    Culture NO GROWTH < 24 HOURS  Final   Report Status PENDING  Incomplete  Blood Culture (routine x 2)     Status: None (Preliminary result)   Collection Time: 01/30/18  4:32 PM  Result Value Ref Range Status   Specimen Description BLOOD RIGHT ANTECUBITAL  Final   Special Requests   Final    BOTTLES DRAWN AEROBIC AND ANAEROBIC Blood Culture adequate volume Performed at [facility], [address], [place], [place] [address]    Culture NO GROWTH < 24 HOURS  Final   Report Status PENDING  Incomplete      Radiology Studies: Ct Head Wo Contrast  Result Date: 01/30/2018 CLINICAL DATA:  Chest heaviness and altered mental status today. EXAM: CT HEAD WITHOUT CONTRAST TECHNIQUE: Contiguous axial images were obtained from the base of the skull through the vertex without intravenous contrast. COMPARISON:  Head CT 11/03/2017.  Brain MRI 11/04/2017. FINDINGS: Brain: No evidence of acute infarction, hemorrhage, hydrocephalus, extra-axial collection or mass lesion/mass effect. Atrophy and chronic microvascular ischemic change noted. Vascular: Atherosclerosis is seen. Skull: Intact.  No focal lesion. Sinuses/Orbits: Scattered ethmoid air cell disease and mild mucosal thickening in the maxillary sinuses noted. The patient is status post cataract surgery. Other: None. IMPRESSION: No acute abnormality. Atrophy and chronic microvascular ischemic change. Mild maxillary sinus and ethmoid air cell disease. Atherosclerosis. Electronically Signed   By: Drusilla Kanner M.D.   On: 01/30/2018 17:29   Mr Brain Wo Contrast  Result Date: 01/31/2018 CLINICAL DATA:  Sudden onset of confusion and loss of consciousness. EXAM: MRI HEAD WITHOUT CONTRAST TECHNIQUE: Multiplanar, multiecho pulse  sequences of the brain and surrounding structures were obtained without intravenous contrast. COMPARISON:  Head CT 01/30/2018.  MRI 11/04/2017. FINDINGS: Brain: Diffusion imaging does not show any acute or subacute infarction. There is generalized atrophy. No focal insult affects the brainstem or cerebellum. There is an incidental venous angioma in the left inferior pons. There are mild chronic small-vessel ischemic changes of the cerebral hemispheric white matter. No cortical or large vessel territory infarction. No mass lesion, hemorrhage, hydrocephalus or extra-axial collection. Vascular: Major vessels at the base of the brain show flow. Skull and upper cervical spine: Negative Sinuses/Orbits: Clear/normal Other: None IMPRESSION: No acute finding. Atrophy and chronic small-vessel ischemic changes as outlined above. Electronically Signed   By: Loraine Leriche  Shogry M.D.   On: 01/31/2018 09:10   Dg Chest Port 1 View  Result Date: 01/30/2018 CLINICAL DATA:  Chest heaviness. Altered mental status. Hypotensive. History of bowel sign wrist disease. EXAM: PORTABLE CHEST 1 VIEW COMPARISON:  Chest radiographs October 25, 2017 FINDINGS: Stable cardiomegaly. Calcified aortic arch. Similar fullness of the pulmonary hila. No pleural effusion or focal consolidation in this low inspiratory examination with crowded vascular markings. No pneumothorax. Multiple EKG lines overlie the patient and may obscure subtle underlying pathology. Included soft tissue planes and osseous structures are non suspicious. IMPRESSION: 1. Stable cardiomegaly. No acute pulmonary process for this low inspiratory examination. 2.  Aortic Atherosclerosis (ICD10-I70.0). Electronically Signed   By: Awilda Metroourtnay  Bloomer M.D.   On: 01/30/2018 17:13    Scheduled Meds: . divalproex  125 mg Oral QHS  . docusate sodium  100 mg Oral BID  . escitalopram  15 mg Oral Daily  . ferrous sulfate  325 mg Oral Q breakfast  . finasteride  5 mg Oral QHS  . insulin aspart   0-15 Units Subcutaneous TID WC  . insulin aspart  0-5 Units Subcutaneous QHS  . lactulose  10 g Oral QHS  . lactulose  30 g Oral Daily  . levothyroxine  88 mcg Oral QAC breakfast  . lidocaine  1 patch Transdermal Q24H  . pioglitazone  15 mg Oral Daily  . pravastatin  40 mg Oral q1800  . rivaroxaban  20 mg Oral Q breakfast  . rOPINIRole  0.5 mg Oral QHS  . sodium chloride flush  3 mL Intravenous Q12H   Continuous Infusions: . sodium chloride 75 mL/hr at 01/30/18 2234     LOS: 1 day   Time spent: 35 minutes.  Tyrone Nineyan B Jovonne Wilton, MD [facility] [email or web address] Password [email or web address] 01/31/2018, 6:24 PM

## 2018-01-31 NOTE — NC FL2 (Signed)
[place] MEDICAID FL2 LEVEL OF CARE SCREENING TOOL     IDENTIFICATION  Patient Name: [name] Birthdate: 1930-08-22 Sex: male Admission Date (Current Location): 01/30/2018  [facility] [place]   Facility and Address:  [facility] [place], [address], [place], [place] [address]      Provider Number: [id number]  Attending Physician Name and Address:  Tyrone NineGrunz, Ryan B, MD  Relative Name and Phone Number:       Current Level of Care: Hospital Recommended Level of Care: Skilled Nursing Facility Prior Approval Number:    Date Approved/Denied:   PASRR Number:    Discharge Plan: SNF    Current Diagnoses: Patient Active Problem List   Diagnosis Date Noted  . Hypotension 01/30/2018  . Syncope 01/30/2018  . Facial droop 11/04/2017  . Left arm weakness 11/04/2017  . Symptomatic bradycardia 11/03/2017  . Bradycardia   . Transient alteration of awareness   . Bilateral leg weakness 06/27/2017  . Depression 12/07/2016  . Alzheimer's type dementia (HCC) 12/07/2016  . Anxiety 01/11/2016  . Other chronic pain 06/17/2015  . Grief 06/17/2015  . Major depression 06/17/2015  . Type 2 diabetes mellitus with complication (HCC)   . Discitis 01/10/2015  . Osteomyelitis of thoracic spine (HCC) 01/10/2015  . Thoracic spine pain 11/28/2014  . Elevated PSA 11/08/2014  . Prosthetic hip infection (HCC) 02/16/2013  . MSSA (methicillin susceptible Staphylococcus aureus) infection   . Infected pacemaker (HCC)   . Closed dislocation of right hip (HCC) 10/02/2012    Class: Acute  . Retroperitoneal fluid collection 08/28/2012  . Staphylococcus aureus bacteremia 08/27/2012  . Hypokalemia 08/26/2012  . DM type 2 (diabetes mellitus, type 2) (HCC) 08/26/2012  . Thrombocytopenia (HCC) 03/21/2012  . Hypothyroidism 02/14/2012  . Hyperlipidemia LDL goal <100 02/14/2012  . Benign prostatic hyperplasia 02/14/2012  . Lower extremity weakness 02/11/2012  .  Anemia in chronic kidney disease 02/11/2012  . Chronic kidney disease, stage III (moderate) (HCC) 02/11/2012  . Atrial fibrillation (HCC) 02/11/2012  . Spinal stenosis 02/11/2012  . Hip dislocation, right (HCC) 11/30/2010  . Essential hypertension 11/30/2010    Orientation RESPIRATION BLADDER Height & Weight     Self, Place  Normal External catheter, Incontinent(placed 01/30/2018) Weight: 253 lb 15.5 oz (115.2 kg) Height:  5\' 11"  (180.3 cm)  BEHAVIORAL SYMPTOMS/MOOD NEUROLOGICAL BOWEL NUTRITION STATUS      Incontinent Diet(carb modified, thin liquids)  AMBULATORY STATUS COMMUNICATION OF NEEDS Skin   Extensive Assist Verbally Normal                       Personal Care Assistance Level of Assistance  Dressing, Feeding, Bathing Bathing Assistance: Maximum assistance Feeding assistance: Limited assistance Dressing Assistance: Maximum assistance     Functional Limitations Info  Sight, Hearing, Speech Sight Info: Adequate Hearing Info: Adequate Speech Info: Adequate    SPECIAL CARE FACTORS FREQUENCY  PT (By licensed PT), OT (By licensed OT)     PT Frequency: 2x OT Frequency: 2x            Contractures Contractures Info: Not present    Additional Factors Info  Code Status, Allergies Code Status Info: Full Code Allergies Info:  Ace Inhibitors, Gabapentin, Nsaids, Zolpidem           Current Medications (01/31/2018):  This is the current hospital active medication list Current Facility-Administered Medications  Medication Dose Route Frequency Provider Last Rate Last Dose  . 0.9 %  sodium  chloride infusion   Intravenous Continuous Rometta Emery, MD 75 mL/hr at 01/30/18 2234    . acetaminophen (TYLENOL) tablet 650 mg  650 mg Oral Q6H PRN Audrea Muscat T, NP   650 mg at 01/31/18 0606  . albuterol (PROVENTIL) (2.5 MG/3ML) 0.083% nebulizer solution 2.5 mg  2.5 mg Nebulization Q6H PRN Earlie Lou L, MD      . divalproex (DEPAKOTE SPRINKLE) capsule 125 mg  125 mg  Oral QHS Rometta Emery, MD   125 mg at 01/30/18 2236  . docusate sodium (COLACE) capsule 100 mg  100 mg Oral BID Earlie Lou L, MD   100 mg at 01/31/18 1118  . escitalopram (LEXAPRO) tablet 15 mg  15 mg Oral Daily Earlie Lou L, MD   15 mg at 01/31/18 1118  . ferrous sulfate tablet 325 mg  325 mg Oral Q breakfast Rometta Emery, MD   325 mg at 01/31/18 1117  . finasteride (PROSCAR) tablet 5 mg  5 mg Oral QHS Rometta Emery, MD   5 mg at 01/30/18 2236  . insulin aspart (novoLOG) injection 0-15 Units  0-15 Units Subcutaneous TID WC Garba, Mohammad L, MD      . insulin aspart (novoLOG) injection 0-5 Units  0-5 Units Subcutaneous QHS Garba, Mohammad L, MD      . lactulose (CHRONULAC) 10 GM/15ML solution 10 g  10 g Oral QHS Rometta Emery, MD   10 g at 01/30/18 2236  . lactulose (CHRONULAC) 10 GM/15ML solution 30 g  30 g Oral Daily Earlie Lou L, MD   30 g at 01/31/18 1115  . levothyroxine (SYNTHROID, LEVOTHROID) tablet 88 mcg  88 mcg Oral QAC breakfast Rometta Emery, MD   88 mcg at 01/31/18 0606  . lidocaine (LIDODERM) 5 % 1 patch  1 patch Transdermal Q24H Rometta Emery, MD   1 patch at 01/31/18 1119  . pioglitazone (ACTOS) tablet 15 mg  15 mg Oral Daily Rometta Emery, MD   15 mg at 01/31/18 1117  . pravastatin (PRAVACHOL) tablet 40 mg  40 mg Oral q1800 Rometta Emery, MD   40 mg at 01/30/18 2236  . rivaroxaban (XARELTO) tablet 20 mg  20 mg Oral Q breakfast Earlie Lou L, MD   20 mg at 01/31/18 1126  . rOPINIRole (REQUIP) tablet 0.5 mg  0.5 mg Oral QHS Earlie Lou L, MD   0.5 mg at 01/30/18 2236  . sodium chloride flush (NS) 0.9 % injection 3 mL  3 mL Intravenous Q12H Earlie Lou L, MD   3 mL at 01/31/18 1120  . tiZANidine (ZANAFLEX) tablet 4 mg  4 mg Oral Q6H PRN Rometta Emery, MD         Discharge Medications: Please see discharge summary for a list of discharge medications.  Relevant Imaging Results:  Relevant Lab  Results:   Additional Information SS# [id number]  Maree Krabbe, LCSW

## 2018-02-01 ENCOUNTER — Inpatient Hospital Stay (HOSPITAL_COMMUNITY): Payer: Medicare Other

## 2018-02-01 DIAGNOSIS — I361 Nonrheumatic tricuspid (valve) insufficiency: Secondary | ICD-10-CM

## 2018-02-01 DIAGNOSIS — G8929 Other chronic pain: Secondary | ICD-10-CM

## 2018-02-01 LAB — MRSA PCR SCREENING: MRSA by PCR: NEGATIVE

## 2018-02-01 LAB — BASIC METABOLIC PANEL
Anion gap: 6 (ref 5–15)
BUN: 16 mg/dL (ref 8–23)
CALCIUM: 8.5 mg/dL — AB (ref 8.9–10.3)
CO2: 24 mmol/L (ref 22–32)
Chloride: 111 mmol/L (ref 98–111)
Creatinine, Ser: 1.07 mg/dL (ref 0.61–1.24)
GFR calc Af Amer: >60 mL/min (ref >60)
GFR calc non Af Amer: >60 mL/min (ref >60)
Glucose, Bld: 129 mg/dL — ABNORMAL HIGH (ref 70–99)
Potassium: 4.4 mmol/L (ref 3.5–5.1)
Sodium: 141 mmol/L (ref 135–145)

## 2018-02-01 LAB — CBC
HCT: 33.8 % — ABNORMAL LOW (ref 39.0–52.0)
Hemoglobin: 10.5 g/dL — ABNORMAL LOW (ref 13.0–17.0)
MCH: 30.3 pg (ref 26.0–34.0)
MCHC: 31.1 g/dL (ref 30.0–36.0)
MCV: 97.4 fL (ref 80.0–100.0)
Platelets: 147 10*3/uL — ABNORMAL LOW (ref 150–400)
RBC: 3.47 MIL/uL — ABNORMAL LOW (ref 4.22–5.81)
RDW: 15.1 % (ref 11.5–15.5)
WBC: 6.1 10*3/uL (ref 4.0–10.5)
nRBC: 0 % (ref 0.0–0.2)

## 2018-02-01 LAB — ECHOCARDIOGRAM COMPLETE
HEIGHTINCHES: 71 in
WEIGHTICAEL: 4031.77 [oz_av]

## 2018-02-01 LAB — GLUCOSE, CAPILLARY
GLUCOSE-CAPILLARY: 130 mg/dL — AB (ref 70–99)
Glucose-Capillary: 98 mg/dL (ref 70–99)
Glucose-Capillary: 106 mg/dL — ABNORMAL HIGH (ref 70–99)
Glucose-Capillary: 83 mg/dL (ref 70–99)
Glucose-Capillary: 79 mg/dL (ref 70–99)

## 2018-02-01 LAB — CORTISOL-AM, BLOOD: Cortisol - AM: 8.7 ug/dL (ref 6.7–22.6)

## 2018-02-01 MED ORDER — PERFLUTREN LIPID MICROSPHERE
1.0000 mL | INTRAVENOUS | Status: AC | PRN
  Administered 2018-02-01: 3 mL via INTRAVENOUS
  Filled 2018-02-01: qty 10

## 2018-02-01 MED ORDER — SODIUM CHLORIDE 0.9 % IV SOLN
INTRAVENOUS | Status: DC
  Administered 2018-02-02: via INTRAVENOUS

## 2018-02-01 MED ORDER — PERFLUTREN LIPID MICROSPHERE
INTRAVENOUS | Status: AC
  Administered 2018-02-01: 3 mL via INTRAVENOUS
  Filled 2018-02-01: qty 10

## 2018-02-01 NOTE — Progress Notes (Signed)
Pt had 11 beat wide QRS VT. Pt asymptomatic. Pt lying in bed watching tv. [name] notified. No orders given. Will continue to monitor.

## 2018-02-01 NOTE — Consult Note (Signed)
Neurosurgery Consultation  Reason for Consult: Spinal epidural abscess Referring Physician: Jarvis Newcomer  CC: Back spasms  HPI: This is a 85 y.o. man w/ a reported h/o AD, however during my discussion this morning he seems to be a very good historian. He now presents with a few months of leg weakness and was last ambulatory a few months ago. In the past few months he has also lost bowel and bladder control. He previously had a thoracic epidural abscess with leg weakness that was mainly in the right leg. He recovered to walking with a cane, but had an infected hip replacement multiple times on the right that set back his recovery. He then began to lose function in his left leg a few months ago during the same timeline as the loss of bowel and bladder, which was intact after his prior thoracic surgery. Rx notable for rivaroxaban, likely for PAF. He also describes muscle spasms in the back with some radiating radicular pain into the testes. He feels that he is numb roughly below the level of the umbilicus.  ROS: A 14 point ROS was performed and is negative except as noted in the HPI.   PMHx:  Past Medical History:  Diagnosis Date  . Alzheimer's type dementia (HCC) 12/07/2016  . Anemia   . Anxiety   . Arthritis   . Bacteremia   . Bradycardia    a. PPM removed in 2014 due to bacteremia.  . Chronic atrial fibrillation   . Chronic right shoulder pain 06/17/2015  . CKD (chronic kidney disease), stage III (HCC)   . Complication of anesthesia    " Terrible Nightmares"  . Constipation   . Depression   . Diabetes mellitus without complication (HCC)    Type II  . DVT (deep venous thrombosis) (HCC)    a. in 2019 - pt reports behind knee - was on Xarelto 15mg  BID now 20mg  daily.  . Encounter for removal of cardiac resynchronization therapy pacemaker 2017  . Grief 06/17/2015  . History of blood product transfusion   . Hypertension   . Hypothyroidism   . Infected pacemaker (HCC)   . Major depression  06/17/2015  . MSSA (methicillin susceptible Staphylococcus aureus) infection   . OAB (overactive bladder)   . Overactive bladder   . Septic hip (HCC)   . Septicemia (HCC)   . Severe depression (HCC) 12/07/2016  . Staphylococcus aureus infection   . Thoracic spine pain 11/28/2014   FamHx:  Family History  Problem Relation Age of Onset  . Heart failure Mother    SocHx:  reports that he has never smoked. He has never used smokeless tobacco. He reports that he does not drink alcohol or use drugs.  Exam: Vital signs in last 24 hours: Temp:  [97.9 F (36.6 C)-98 F (36.7 C)] 97.9 F (36.6 C) (01/06 2233) Pulse Rate:  [63-84] 63 (01/06 2233) Resp:  [12] 12 (01/06 2233) BP: (113-130)/(68-71) 130/71 (01/06 2233) SpO2:  [97 %-99 %] 97 % (01/06 2233) Weight:  [114.3 kg] 114.3 kg (01/07 0500) General: Awake, alert, cooperative, lying in bed in NAD Head: normocephalic and atruamatic HEENT: neck supple Pulmonary: able to speak in short sentences before having to take a breath with some mild audible wheezing Cardiac: irregularly irregular Abdomen: S NT ND Psych: pleasant, reactive affect, able to report history well and able to repeat back to me the plan of care Extremities: warm and well perfused x4, +LE edema, laying with both hips flexed Neuro: AOx3, PERRL,  gaze neutral, FS Strength 5/5 in BUE, SILT in BUE, no hoffman's Strength: RLE: HF 1/5, KE 2/5, DF 2/5, EHL 1/5, PF 1/5 LLE: HF 1/5, KE 2/5, DF 1/5, EHL 1/5, PF 1/5    Assessment and Plan: 85 y.o. man with months of progressive LE numbness, paraparesis, and loss of bowel & bladder function. MRI T-spine personally reviewed, which shows prior osteomyelitis and associated cord injury w/ myelomalacia at T12-L1. New discitis that's worst at T9-10 with VB erosion of T9 and T8-9 discitis. Post-contrast axial images somewhat degraded but there is clearly stenosis at this level with cord signal change. On the sagittal images it appears to be  mainly from T9-10 PLL hypertrophy, with what appears to be a napkin-ring shaped compressive lesion that is likely epidural abscess and some posterior T910 facet arthritis. MRI L-spine shows likely ankylosis of L2-4 with som edisc changes at L4-5 of uncertain origin, could be due to discitis.  On exam, he is a T12 ASIA C and reports having these deficits for months.  -no neurosurgical intervention at this time, recommend IR guided biopsy of the disc space to get sample for antibioitic guidance -I discussed the above with the patient. 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 I think that decompression and evacuation of the abscess will unfortunately not restore any useful neurologic function. Even if there was a chance of restoring function, I do not think he will be able to participate in rehab enough to gain any useful function. Additionally, his bowel and bladder control is unlikely to improve along with his fixed motor deficits.  -please call with any concerns or questions  Jadene Pierini, MD 02/01/18 7:24 AM [facility]

## 2018-02-01 NOTE — Progress Notes (Signed)
  Echocardiogram 2D Echocardiogram with definity has been performed.  Leta Jungling M 02/01/2018, 10:48 AM

## 2018-02-01 NOTE — Progress Notes (Signed)
Radiologist called results of MRI to NP. Abnormal MRI with T8-9, T9-10 osteomyelitis/discitis with cord compression. Pt has a hx of osteo/discitis which appears to have been treated in 2017 at [facility]. NP called neurosurgeon on call, Ostegard, to relay results of MRI. Neurosurgeon stated there is likely nothing to do due to pt's hx and age. [facility] will see pt in the a.m.  NP called Dr. Toniann Fail to inquire about need for abx. Pt is not septic so will hold off on abx until bx is performed. Blood cultures are pending and so far, NGTD. Pt is afebrile with HR in the 60s and BP in the 130s.  KJKG, NP [facility]

## 2018-02-01 NOTE — Consult Note (Addendum)
Chief Complaint: Patient was seen in consultation today for T8/9 and T9/10 discitis.  Referring Physician(s): Tyrone Nine  Supervising Physician: Julieanne Cotton  Patient Status: [facility] - In-pt  History of Present Illness: [name] is a 85 y.o. male with a past medical history of hypertension, chronic atrial fibrillation on chronic anticoagulation with Xarelto, DVT, Alzheimer's type dementia, CKD stage III, overactive bladder, diabetes mellitus, hypothyroidism, discitis/osteomyelicits with recurrent MSSA infection (thoracolumbar, right hip), arthritis, depression, and anxiety. He presented to [facility] ED 01/30/2018 with complaints of hypotension and syncope. He was admitted for workup and management. Upon workup, he complained of progressive weakness in legs in addition to urinary/fecal incontinence, warranting further investigation.  MR thoracic/lumbar spine 01/31/2018: 1. Findings consistent with osteomyelitis discitis at T8-9 and T9-10. Associated epidural phlegmon/abscess extending from T7-8 through T11-12, with severe spinal stenosis at the level of T9-10. Associated cord signal abnormality at the level of T9-10 compatible with edema and/or myelomalacia. Neuro surgical consultation recommended. 2. Probable associated facet arthritis at T9-10. 3. Possible mild/early discitis at L4-5 and L5-S1 without appreciable epidural collection or other abnormality. Edema about the left L5-S1 facet favored to be related to facet arthritis. 4. Sequelae of chronic discitis osteomyelitis at T12-L1 without evidence for significant or residual or recurrent infection. Small focus of chronic myelomalacia within the distal cord at this level. 5. Multifactorial degenerative changes at L5-S1 with resultant severe canal, with moderate to severe left greater than right foraminal stenosis. 6. Fluid signal intensity within the C7-T1 disc without appreciable enhancement or endplate changes, favored to be  degenerative. Attention at follow-up recommended. 7. Small layering bilateral pleural effusions.  IR requested by Dr. Jarvis Newcomer for possible image-guided T8/9 and/or T9/10 disc aspiration. Patient laying in bed. He is lethargic and confused. Accompanied by daughter at bedside- states that he has Alzheimers and this is his normal confusion level, and he did not sleep well last night so he is very tired.  Patient is currently taking Xarelto 20 mg once daily- last dose 01/31/2018.   Past Medical History:  Diagnosis Date  . Alzheimer's type dementia (HCC) 12/07/2016  . Anemia   . Anxiety   . Arthritis   . Bacteremia   . Bradycardia    a. PPM removed in 2014 due to bacteremia.  . Chronic atrial fibrillation   . Chronic right shoulder pain 06/17/2015  . CKD (chronic kidney disease), stage III (HCC)   . Complication of anesthesia    " Terrible Nightmares"  . Constipation   . Depression   . Diabetes mellitus without complication (HCC)    Type II  . DVT (deep venous thrombosis) (HCC)    a. in 2019 - pt reports behind knee - was on Xarelto 15mg  BID now 20mg  daily.  . Encounter for removal of cardiac resynchronization therapy pacemaker 2017  . Grief 06/17/2015  . History of blood product transfusion   . Hypertension   . Hypothyroidism   . Infected pacemaker (HCC)   . Major depression 06/17/2015  . MSSA (methicillin susceptible Staphylococcus aureus) infection   . OAB (overactive bladder)   . Overactive bladder   . Septic hip (HCC)   . Septicemia (HCC)   . Severe depression (HCC) 12/07/2016  . Staphylococcus aureus infection   . Thoracic spine pain 11/28/2014    Past Surgical History:  Procedure Laterality Date  . COLONOSCOPY W/ POLYPECTOMY     small  . EYE SURGERY     bilateral  . HIP CLOSED REDUCTION Right 10/02/2012  Procedure: CLOSED REDUCTION HIP;  Surgeon: Velna Ochs, MD;  Location: [facility] OR;  Service: Orthopedics;  Laterality: Right;  . ICD LEAD REMOVAL Left 08/31/2012    Procedure: ICD LEAD REMOVAL;  Surgeon: Marinus Maw, MD;  Location: [facility] OR;  Service: Cardiovascular;  Laterality: Left;  . INCISION AND DRAINAGE HIP Right 09/02/2012   Procedure: IRRIGATION AND DEBRIDEMENT RIGHT HIP;  Surgeon: Nestor Lewandowsky, MD;  Location: [facility] OR;  Service: Orthopedics;  Laterality: Right;  . INCISION AND DRAINAGE HIP Right 08/10/2014   Procedure: IRRIGATION AND DEBRIDEMENT RIGHT HIP SUPERFICIAL VS DEEP;  Surgeon: Gean Birchwood, MD;  Location: [facility] OR;  Service: Orthopedics;  Laterality: Right;  . INSERT / REPLACE / REMOVE PACEMAKER     removed in 2017 d/t sepsis/notes 11/03/2017  . JOINT REPLACEMENT    . LUMBAR LAMINECTOMY/DECOMPRESSION MICRODISCECTOMY  02/14/2012   Procedure: LUMBAR LAMINECTOMY/DECOMPRESSION MICRODISCECTOMY 1 LEVEL;  Surgeon: Tia Alert, MD;  Location: [facility] NEURO ORS;  Service: Neurosurgery;  Laterality: N/A;  Thoracic twelve - Lumbar one decompressive laminectomy.  Marland Kitchen RADIOLOGY WITH ANESTHESIA N/A 01/10/2015   Procedure: MRI LUMBAR SPINE WITH/WITHOUT   (RADIOLOGY WITH ANESTHESIA);  Surgeon: Medication Radiologist, MD;  Location: [facility] OR;  Service: Radiology;  Laterality: N/A;  . TEE WITHOUT CARDIOVERSION N/A 08/30/2012   Procedure: TRANSESOPHAGEAL ECHOCARDIOGRAM (TEE);  Surgeon: Laurey Morale, MD;  Location: [facility] ENDOSCOPY;  Service: Cardiovascular;  Laterality: N/A;  . TONSILLECTOMY    . TOTAL HIP REVISION Right 09/02/2012   Procedure: REVISION OF POLYETHYLENE LINER AND FEMORAL HEAD ;  Surgeon: Nestor Lewandowsky, MD;  Location: [facility] OR;  Service: Orthopedics;  Laterality: Right;  . TOTAL HIP REVISION Right 11/28/2012   Procedure: TOTAL HIP REVISION With Placement of Contrain Liner;  Surgeon: Nestor Lewandowsky, MD;  Location: [facility] OR;  Service: Orthopedics;  Laterality: Right;    Allergies: Ace inhibitors; Gabapentin; Nsaids; and Zolpidem  Medications: Prior to Admission medications   Medication Sig Start Date End Date Taking? Authorizing Provider  acetaminophen (TYLENOL) 500 MG  tablet Take 500 mg by mouth 3 (three) times daily.   Yes [provider]  Artificial Saliva (BIOTENE MOISTURIZING MOUTH MT) Use as directed 2 sprays in the mouth or throat See admin instructions. Apply 2 sprays directly onto mouth and tongue. Spread thoroughly inside the mouth - every 6 hours as needed for dry mouth.   Yes [provider]  buPROPion (WELLBUTRIN SR) 100 MG 12 hr tablet Take 100 mg by mouth 2 (two) times daily.   Yes [provider]  divalproex (DEPAKOTE SPRINKLE) 125 MG capsule Take 125 mg by mouth at bedtime.   Yes [provider]  docusate sodium (COLACE) 100 MG capsule Take 100 mg by mouth 2 (two) times daily.    Yes [provider]  escitalopram (LEXAPRO) 10 MG tablet Take 10 mg by mouth daily.  10/17/17  Yes [provider]  ferrous sulfate 325 (65 FE) MG tablet Take 325 mg by mouth 2 (two) times daily with a meal.    Yes [provider]  finasteride (PROSCAR) 5 MG tablet Take 5 mg by mouth at bedtime.    Yes [provider]  lactulose (CHRONULAC) 10 GM/15ML solution Take 10-30 g by mouth See admin instructions. Take 45 mls (30 g) by mouth every morning, take 15 mls (10 g) at bedtime. Hold for loose stool.   Yes [provider]  levothyroxine (SYNTHROID, LEVOTHROID) 88 MCG tablet Take 88 mcg by mouth daily before  breakfast.   Yes [provider]  Lidocaine (ASPERCREME LIDOCAINE) 4 % PTCH Apply 3 patches topically See admin instructions. Apply one patch to left arm between shoulder and elbow every morning, remove every night (12 hrs later); apply 2nd patch between shoulder blades every morning, remove every night (12 hrs later); apply 3rd patch to right arm between shoulder and elbow every morning, remover every night (12 hrs later)   Yes [provider]  lovastatin (MEVACOR) 40 MG tablet Take 40 mg by mouth at bedtime.  07/09/14  Yes [provider]  nystatin (NYSTATIN) powder  Apply topically See admin instructions. Apply powder to skin fold rash twice daily until resolved   Yes [provider]  pioglitazone (ACTOS) 15 MG tablet Take 15 mg by mouth daily.   Yes [provider]  rivaroxaban (XARELTO) 20 MG TABS tablet Take 20 mg by mouth daily.   Yes [provider]  rOPINIRole (REQUIP) 0.5 MG tablet Take 0.5 mg by mouth at bedtime.  10/18/17  Yes [provider]  telmisartan (MICARDIS) 40 MG tablet Take 40 mg by mouth daily.   Yes [provider]  terazosin (HYTRIN) 5 MG capsule Take 5 mg by mouth at bedtime.  11/15/12  Yes [provider]  tiZANidine (ZANAFLEX) 2 MG tablet Take 2 mg by mouth 3 (three) times daily.  12/03/17  Yes [provider]  torsemide (DEMADEX) 10 MG tablet Take 10 mg by mouth daily.    Yes [provider]  vitamin C (ASCORBIC ACID) 500 MG tablet Take 500 mg by mouth 2 (two) times daily.   Yes [provider]  ALPRAZolam (XANAX) 0.25 MG tablet Take 1 tablet (0.25 mg total) by mouth 2 (two) times daily as needed for anxiety (do not give if Drowsiness is present). Patient not taking: Reported on 01/30/2018 11/09/17   Jerald Kief, MD  HYDROcodone-acetaminophen (NORCO/VICODIN) 5-325 MG tablet Take 1 tablet by mouth every 12 (twelve) hours as needed for moderate pain. Patient not taking: Reported on 01/30/2018 11/09/17   Jerald Kief, MD     Family History  Problem Relation Age of Onset  . Heart failure Mother     Social History   Socioeconomic History  . Marital status: Married    Spouse name: Not on file  . Number of children: Not on file  . Years of education: Not on file  . Highest education level: Not on file  Occupational History  . Not on file  Social Needs  . Financial resource strain: Not on file  . Food insecurity:    Worry: Not on file    Inability: Not on file  . Transportation needs:    Medical: Not on file    Non-medical: Not on file    Tobacco Use  . Smoking status: Never Smoker  . Smokeless tobacco: Never Used  Substance and Sexual Activity  . Alcohol use: No    Alcohol/week: 0.0 standard drinks  . Drug use: No  . Sexual activity: Not on file  Lifestyle  . Physical activity:    Days per week: Not on file    Minutes per session: Not on file  . Stress: Not on file  Relationships  . Social connections:    Talks on phone: Not on file    Gets together: Not on file    Attends religious service: Not on file    Active member of club or organization: Not on file    Attends  meetings of clubs or organizations: Not on file    Relationship status: Not on file  Other Topics Concern  . Not on file  Social History Narrative  . Not on file     Review of Systems: A 12 point ROS discussed and pertinent positives are indicated in the HPI above.  All other systems are negative.  Review of Systems  Unable to perform ROS: Dementia    Vital Signs: BP (!) 108/49   Pulse 91   Temp 97.8 F (36.6 C) (Oral)   Resp 12   Ht 5\' 11"  (1.803 m)   Wt 251 lb 15.8 oz (114.3 kg)   SpO2 98%   BMI 35.14 kg/m   Physical Exam Vitals signs and nursing note reviewed.  Constitutional:      General: He is not in acute distress.    Appearance: Normal appearance.     Comments: Lethargic and confused.  Cardiovascular:     Rate and Rhythm: Normal rate and regular rhythm.     Heart sounds: Normal heart sounds. No murmur.  Pulmonary:     Effort: Pulmonary effort is normal. No respiratory distress.     Breath sounds: Normal breath sounds. No wheezing.  Skin:    General: Skin is warm and dry.  Neurological:     Mental Status: Mental status is at baseline.  Psychiatric:     Comments: Lethargic and confused.      MD Evaluation Airway: WNL Heart: WNL Abdomen: WNL Chest/ Lungs: WNL ASA  Classification: 3 Mallampati/Airway Score: Two   Imaging: Ct Head Wo Contrast  Result Date: 01/30/2018 CLINICAL DATA:  Chest heaviness and  altered mental status today. EXAM: CT HEAD WITHOUT CONTRAST TECHNIQUE: Contiguous axial images were obtained from the base of the skull through the vertex without intravenous contrast. COMPARISON:  Head CT 11/03/2017.  Brain MRI 11/04/2017. FINDINGS: Brain: No evidence of acute infarction, hemorrhage, hydrocephalus, extra-axial collection or mass lesion/mass effect. Atrophy and chronic microvascular ischemic change noted. Vascular: Atherosclerosis is seen. Skull: Intact.  No focal lesion. Sinuses/Orbits: Scattered ethmoid air cell disease and mild mucosal thickening in the maxillary sinuses noted. The patient is status post cataract surgery. Other: None. IMPRESSION: No acute abnormality. Atrophy and chronic microvascular ischemic change. Mild maxillary sinus and ethmoid air cell disease. Atherosclerosis. Electronically Signed   By: Drusilla Kanner M.D.   On: 01/30/2018 17:29   Mr Brain Wo Contrast  Result Date: 01/31/2018 CLINICAL DATA:  Sudden onset of confusion and loss of consciousness. EXAM: MRI HEAD WITHOUT CONTRAST TECHNIQUE: Multiplanar, multiecho pulse sequences of the brain and surrounding structures were obtained without intravenous contrast. COMPARISON:  Head CT 01/30/2018.  MRI 11/04/2017. FINDINGS: Brain: Diffusion imaging does not show any acute or subacute infarction. There is generalized atrophy. No focal insult affects the brainstem or cerebellum. There is an incidental venous angioma in the left inferior pons. There are mild chronic small-vessel ischemic changes of the cerebral hemispheric white matter. No cortical or large vessel territory infarction. No mass lesion, hemorrhage, hydrocephalus or extra-axial collection. Vascular: Major vessels at the base of the brain show flow. Skull and upper cervical spine: Negative Sinuses/Orbits: Clear/normal Other: None IMPRESSION: No acute finding. Atrophy and chronic small-vessel ischemic changes as outlined above. Electronically Signed   By: Paulina Fusi M.D.   On: 01/31/2018 09:10   Mr Lumbar Spine Wo Contrast  Addendum Date: 02/01/2018   ADDENDUM REPORT: 02/01/2018 07:24 ADDENDUM: Findings were communicated by telephone to the nurse practitioner taking care of  the patient [name] at 12:43 a.m. on 02/01/2017. Electronically Signed   By: Rise Mu M.D.   On: 02/01/2018 07:24   Result Date: 02/01/2018 CLINICAL DATA:  85 year old male with history of osteomyelitis discitis, status post thoracic fusion, now with new lower extremity weakness. EXAM: MRI THORACIC AND LUMBAR SPINE WITHOUT AND WITH CONTRAST TECHNIQUE: Multiplanar and multiecho pulse sequences of the thoracic and lumbar spine were obtained without and with intravenous contrast. CONTRAST:  10 cc of Gadavist. COMPARISON:  Comparison made with prior MRI from 01/10/2015. FINDINGS: MRI THORACIC SPINE FINDINGS Alignment: Limited sagittal imaging of the cervical spine grossly unremarkable other than for degenerative spondylolysis and facet arthrosis. No significant stenosis. Stable thoracic alignment with mild straightening of the normal thoracic kyphosis. Trace chronic anterolisthesis of T12 on L1, stable from previous. Vertebrae: Similar bulky osteophytic spurring or with suspected ankylosis again seen throughout the thoracic spine. Abnormal fluid density within the T9-T10 interspace with adjacent endplate edema and enhancement, concerning for osteomyelitis discitis. There has been mildly progressive height loss at the anterior inferior T9 vertebral body, with progressive height loss at the superior endplate of T10 as compared to previous exam. Pocket of fluid and/or disc material seen eroding into the mid aspect of the T9 vertebral body (series 18, image 14). Findings compatible with acute osteomyelitis discitis. Edema and enhancement extends into the bilateral T9-10 facets, likely reflecting associated septic arthritis. There is additional abnormal fluid signal intensity with  enhancement within the T8-9 interspace, consistent with osteomyelitis discitis as well. Vertebral body heights relatively maintained at this level without significant height loss. Changes compatible with chronic osteomyelitis seen about the T12-L1 level without evidence for significant active infection at this time. There has been interval loss of disc height with partial ankylosis of the T12 and L1 vertebral body since previous exam. Fluid signal intensity without associated endplate edema or enhancement noted at the C7-T1 space, favored to be degenerative in nature (series 18, image 15). Underlying bone marrow signal intensity mildly heterogeneous but within normal limits. No worrisome osseous lesions. Cord: Abnormal cord signal abnormality seen within the thoracic spinal cord extending from T8-9 through T10-11, concerning for edema and/or myelomalacia due to compressive stenosis. Small focus of chronic myelomalacia noted at the T12-L1 level as well (series 17, image 10). Signal intensity within the thoracic spinal cord otherwise within normal limits. Paraspinal and other soft tissues: Paraspinous edema and phlegmon seen about the T8-9 and T9-10 interspaces. No discrete paraspinous or soft tissue abscess identified. Small layering bilateral pleural effusions noted. Dolichoectatic intrathoracic aorta with associated atherosclerotic change. Disc levels: Abnormal epidural enhancement seen extending from the T7-8 level through the T11-12 levels related to T8-9 and T9-10 osteomyelitis discitis (series 32, image 7). There is circumferential epidural phlegmon/abscess at the T8-9 and T9-10 levels with resultant up to severe spinal stenosis. This is most pronounced at the level of T9 where the thecal sac is compressed measuring 6 mm in transverse diameter (series 20, image 26). Associated edema and/or myelomalacia as above. Partial ankylosis at T12-L1 with trace anterolisthesis. Resultant circumferential disc osteophyte  mildly flattens and indents the ventral thecal sac. Moderate facet hypertrophy at this level with resultant mild spinal stenosis. Patient status post prior posterior decompression at this level. No other significant stenosis within the thoracic spine. MRI LUMBAR SPINE FINDINGS Segmentation: Transitional lumbosacral anatomy with partial lumbarization of the S1 segment. Same numbering system employed as on previous exams. Alignment: Straightening with reversal of the normal lordosis at the thoracolumbar junction, stable.  Trace anterolisthesis of T12 on L1, stable and chronic. Vertebrae: Bulky endplate osteophytic spurring with ankylosis again seen throughout the lumbar spine with multilevel ankylosis, most notable at L2 through L4. Prior posterior decompression at the T12-L1 level. Bone marrow signal intensity heterogeneous without worrisome osseous lesions. Changes compatible with chronic/burned-out osteomyelitis at the T12-L1 level with ankylosis. Small amount of fluid signal seen within the anterolateral aspect of the L4-5 disc space without associated endplate edema. Fluid signal also seen within the L5-S1 disc space with small amount of reactive endplate edema, most notable on the left (series 28, image 11). Abnormal marrow edema present about the left L5-S1 facet (series 28, image 11). Conus medullaris: Extends to the L1-2. Small focus of chronic myelomalacia at the level of T12-L1 (series 27, image 7). Conus medullaris otherwise within normal limits. Paraspinal and other soft tissues: Chronic fatty atrophy noted within the posterior paraspinous soft tissues as well as the right psoas muscle. No paraspinous collection. Chronic bilateral renal atrophy partially visualized. Atherosclerotic change noted within the intra-abdominal aorta. Disc levels: L1-2: Seen only on sagittal projection. Diffuse disc bulge with disc desiccation and intervertebral disc space narrowing. Bilateral facet hypertrophy. Mild canal  narrowing without compressive stenosis. Foramina remain patent. L2-3: Ankylosis of the L2 and L3 disc space. Posterior osteophytic ridging flattens and indents the ventral thecal sac, greater on the right. Mild facet ligament flavum hypertrophy. Resultant mild canal with right lateral recess stenosis. Foramina remain patent. L3-4: Chronic ankylosis with loss of the L3-4 disc space. Posterior osseous ridging mildly flattens the ventral thecal sac. Moderate bilateral facet hypertrophy. No significant canal stenosis. Mild bilateral foraminal narrowing. L4-5: Chronic intervertebral disc space narrowing with partial ankylosis of the L4 and L5 vertebral bodies. Moderate facet hypertrophy. Prior posterior decompression. Residual mild canal with bilateral lateral recess narrowing. Mild left with mild to moderate right L4 foraminal stenosis. L5-S1: Chronic intervertebral disc space narrowing with diffuse disc bulge and disc desiccation. Reactive endplate osteophytic spurring. Severe left with mild right facet hypertrophy. Ligamentum flavum thickening. Prior posterior decompression. Persistent severe canal with bilateral lateral recess stenosis. Moderate right with severe left L5 foraminal narrowing. IMPRESSION: 1. Findings consistent with osteomyelitis discitis at T8-9 and T9-10. Associated epidural phlegmon/abscess extending from T7-8 through T11-12, with severe spinal stenosis at the level of T9-10. Associated cord signal abnormality at the level of T9-10 compatible with edema and/or myelomalacia. Neuro surgical consultation recommended. 2. Probable associated facet arthritis at T9-10. 3. Possible mild/early discitis at L4-5 and L5-S1 without appreciable epidural collection or other abnormality. Edema about the left L5-S1 facet favored to be related to facet arthritis. 4. Sequelae of chronic discitis osteomyelitis at T12-L1 without evidence for significant or residual or recurrent infection. Small focus of chronic  myelomalacia within the distal cord at this level. 5. Multifactorial degenerative changes at L5-S1 with resultant severe canal, with moderate to severe left greater than right foraminal stenosis. 6. Fluid signal intensity within the C7-T1 disc without appreciable enhancement or endplate changes, favored to be degenerative. Attention at follow-up recommended. 7. Small layering bilateral pleural effusions. Current attempt being made to contact the ordering and/or covering physician. Results will be communicated as soon as possible. Electronically Signed: By: Rise MuBenjamin  McClintock M.D. On: 02/01/2018 00:33   Mr Thoracic Spine W Wo Contrast  Addendum Date: 02/01/2018   ADDENDUM REPORT: 02/01/2018 07:24 ADDENDUM: Findings were communicated by telephone to the nurse practitioner taking care of the patient Joshua NormanKaren Kirby-Graham at 12:43 a.m. on 02/01/2017. Electronically Signed   By:  Rise Mu M.D.   On: 02/01/2018 07:24   Result Date: 02/01/2018 CLINICAL DATA:  85 year old male with history of osteomyelitis discitis, status post thoracic fusion, now with new lower extremity weakness. EXAM: MRI THORACIC AND LUMBAR SPINE WITHOUT AND WITH CONTRAST TECHNIQUE: Multiplanar and multiecho pulse sequences of the thoracic and lumbar spine were obtained without and with intravenous contrast. CONTRAST:  10 cc of Gadavist. COMPARISON:  Comparison made with prior MRI from 01/10/2015. FINDINGS: MRI THORACIC SPINE FINDINGS Alignment: Limited sagittal imaging of the cervical spine grossly unremarkable other than for degenerative spondylolysis and facet arthrosis. No significant stenosis. Stable thoracic alignment with mild straightening of the normal thoracic kyphosis. Trace chronic anterolisthesis of T12 on L1, stable from previous. Vertebrae: Similar bulky osteophytic spurring or with suspected ankylosis again seen throughout the thoracic spine. Abnormal fluid density within the T9-T10 interspace with adjacent endplate edema  and enhancement, concerning for osteomyelitis discitis. There has been mildly progressive height loss at the anterior inferior T9 vertebral body, with progressive height loss at the superior endplate of T10 as compared to previous exam. Pocket of fluid and/or disc material seen eroding into the mid aspect of the T9 vertebral body (series 18, image 14). Findings compatible with acute osteomyelitis discitis. Edema and enhancement extends into the bilateral T9-10 facets, likely reflecting associated septic arthritis. There is additional abnormal fluid signal intensity with enhancement within the T8-9 interspace, consistent with osteomyelitis discitis as well. Vertebral body heights relatively maintained at this level without significant height loss. Changes compatible with chronic osteomyelitis seen about the T12-L1 level without evidence for significant active infection at this time. There has been interval loss of disc height with partial ankylosis of the T12 and L1 vertebral body since previous exam. Fluid signal intensity without associated endplate edema or enhancement noted at the C7-T1 space, favored to be degenerative in nature (series 18, image 15). Underlying bone marrow signal intensity mildly heterogeneous but within normal limits. No worrisome osseous lesions. Cord: Abnormal cord signal abnormality seen within the thoracic spinal cord extending from T8-9 through T10-11, concerning for edema and/or myelomalacia due to compressive stenosis. Small focus of chronic myelomalacia noted at the T12-L1 level as well (series 17, image 10). Signal intensity within the thoracic spinal cord otherwise within normal limits. Paraspinal and other soft tissues: Paraspinous edema and phlegmon seen about the T8-9 and T9-10 interspaces. No discrete paraspinous or soft tissue abscess identified. Small layering bilateral pleural effusions noted. Dolichoectatic intrathoracic aorta with associated atherosclerotic change. Disc  levels: Abnormal epidural enhancement seen extending from the T7-8 level through the T11-12 levels related to T8-9 and T9-10 osteomyelitis discitis (series 32, image 7). There is circumferential epidural phlegmon/abscess at the T8-9 and T9-10 levels with resultant up to severe spinal stenosis. This is most pronounced at the level of T9 where the thecal sac is compressed measuring 6 mm in transverse diameter (series 20, image 26). Associated edema and/or myelomalacia as above. Partial ankylosis at T12-L1 with trace anterolisthesis. Resultant circumferential disc osteophyte mildly flattens and indents the ventral thecal sac. Moderate facet hypertrophy at this level with resultant mild spinal stenosis. Patient status post prior posterior decompression at this level. No other significant stenosis within the thoracic spine. MRI LUMBAR SPINE FINDINGS Segmentation: Transitional lumbosacral anatomy with partial lumbarization of the S1 segment. Same numbering system employed as on previous exams. Alignment: Straightening with reversal of the normal lordosis at the thoracolumbar junction, stable. Trace anterolisthesis of T12 on L1, stable and chronic. Vertebrae: Bulky endplate osteophytic spurring  with ankylosis again seen throughout the lumbar spine with multilevel ankylosis, most notable at L2 through L4. Prior posterior decompression at the T12-L1 level. Bone marrow signal intensity heterogeneous without worrisome osseous lesions. Changes compatible with chronic/burned-out osteomyelitis at the T12-L1 level with ankylosis. Small amount of fluid signal seen within the anterolateral aspect of the L4-5 disc space without associated endplate edema. Fluid signal also seen within the L5-S1 disc space with small amount of reactive endplate edema, most notable on the left (series 28, image 11). Abnormal marrow edema present about the left L5-S1 facet (series 28, image 11). Conus medullaris: Extends to the L1-2. Small focus of  chronic myelomalacia at the level of T12-L1 (series 27, image 7). Conus medullaris otherwise within normal limits. Paraspinal and other soft tissues: Chronic fatty atrophy noted within the posterior paraspinous soft tissues as well as the right psoas muscle. No paraspinous collection. Chronic bilateral renal atrophy partially visualized. Atherosclerotic change noted within the intra-abdominal aorta. Disc levels: L1-2: Seen only on sagittal projection. Diffuse disc bulge with disc desiccation and intervertebral disc space narrowing. Bilateral facet hypertrophy. Mild canal narrowing without compressive stenosis. Foramina remain patent. L2-3: Ankylosis of the L2 and L3 disc space. Posterior osteophytic ridging flattens and indents the ventral thecal sac, greater on the right. Mild facet ligament flavum hypertrophy. Resultant mild canal with right lateral recess stenosis. Foramina remain patent. L3-4: Chronic ankylosis with loss of the L3-4 disc space. Posterior osseous ridging mildly flattens the ventral thecal sac. Moderate bilateral facet hypertrophy. No significant canal stenosis. Mild bilateral foraminal narrowing. L4-5: Chronic intervertebral disc space narrowing with partial ankylosis of the L4 and L5 vertebral bodies. Moderate facet hypertrophy. Prior posterior decompression. Residual mild canal with bilateral lateral recess narrowing. Mild left with mild to moderate right L4 foraminal stenosis. L5-S1: Chronic intervertebral disc space narrowing with diffuse disc bulge and disc desiccation. Reactive endplate osteophytic spurring. Severe left with mild right facet hypertrophy. Ligamentum flavum thickening. Prior posterior decompression. Persistent severe canal with bilateral lateral recess stenosis. Moderate right with severe left L5 foraminal narrowing. IMPRESSION: 1. Findings consistent with osteomyelitis discitis at T8-9 and T9-10. Associated epidural phlegmon/abscess extending from T7-8 through T11-12, with  severe spinal stenosis at the level of T9-10. Associated cord signal abnormality at the level of T9-10 compatible with edema and/or myelomalacia. Neuro surgical consultation recommended. 2. Probable associated facet arthritis at T9-10. 3. Possible mild/early discitis at L4-5 and L5-S1 without appreciable epidural collection or other abnormality. Edema about the left L5-S1 facet favored to be related to facet arthritis. 4. Sequelae of chronic discitis osteomyelitis at T12-L1 without evidence for significant or residual or recurrent infection. Small focus of chronic myelomalacia within the distal cord at this level. 5. Multifactorial degenerative changes at L5-S1 with resultant severe canal, with moderate to severe left greater than right foraminal stenosis. 6. Fluid signal intensity within the C7-T1 disc without appreciable enhancement or endplate changes, favored to be degenerative. Attention at follow-up recommended. 7. Small layering bilateral pleural effusions. Current attempt being made to contact the ordering and/or covering physician. Results will be communicated as soon as possible. Electronically Signed: By: Rise MuBenjamin  McClintock M.D. On: 02/01/2018 00:33   Dg Chest Port 1 View  Result Date: 01/30/2018 CLINICAL DATA:  Chest heaviness. Altered mental status. Hypotensive. History of bowel sign wrist disease. EXAM: PORTABLE CHEST 1 VIEW COMPARISON:  Chest radiographs October 25, 2017 FINDINGS: Stable cardiomegaly. Calcified aortic arch. Similar fullness of the pulmonary hila. No pleural effusion or focal consolidation in this low  inspiratory examination with crowded vascular markings. No pneumothorax. Multiple EKG lines overlie the patient and may obscure subtle underlying pathology. Included soft tissue planes and osseous structures are non suspicious. IMPRESSION: 1. Stable cardiomegaly. No acute pulmonary process for this low inspiratory examination. 2.  Aortic Atherosclerosis (ICD10-I70.0).  Electronically Signed   By: Awilda Metro M.D.   On: 01/30/2018 17:13    Labs:  CBC: Recent Labs    11/03/17 1715  11/03/17 1738 01/30/18 1620 01/31/18 1143 02/01/18 0626  WBC 6.8  --   --  4.0 7.5 6.1  HGB 9.2*   < > 9.5* 7.9* 10.8* 10.5*  HCT 30.5*   < > 28.0* 26.4* 35.0* 33.8*  PLT 187  --   --  PLATELET CLUMPS NOTED ON SMEAR, COUNT APPEARS DECREASED 157 147*   < > = values in this interval not displayed.    COAGS: Recent Labs    11/03/17 1715  INR 2.44  APTT 47*    BMP: Recent Labs    11/04/17 0521 01/30/18 1620 01/31/18 1000 02/01/18 0626  NA 138 141 140 141  K 4.6 3.1* 4.1 4.4  CL 106 118* 111 111  CO2 24 20* 22 24  GLUCOSE 87 93 91 129*  BUN 31* 20 18 16   CALCIUM 8.6* 6.5* 8.4* 8.5*  CREATININE 1.40* 1.11 1.14 1.07  GFRNONAA 44* >60 58* >60  GFRAA 51* >60 >60 >60    LIVER FUNCTION TESTS: Recent Labs    10/25/17 1442 11/03/17 1715 01/30/18 1620 01/31/18 1000  BILITOT 0.7 0.6 0.4 0.6  AST 20 19 11* 15  ALT 14 13 7 9   ALKPHOS 79 82 50 63  PROT 5.9* 6.2* 4.2* 5.8*  ALBUMIN 3.1* 3.2* 2.1* 2.9*    TUMOR MARKERS: No results for input(s): AFPTM, CEA, CA199, CHROMGRNA in the last 8760 hours.  Assessment and Plan:  T8/9 and T9/10 discitis. Plan for image-guided T8/9 and/or T9/10 disc aspiration tentatively for 02/02/2018 with Dr. Corliss Skains. Patient will be NPO at midnight. Afebrile and WBCs WNL. Patient is currently taking Xarelto 20 mg once daily, last dose 01/31/2018- ok to proceed 02/02/2018 per Dr. Corliss Skains. INR pending.  Consent obtained by patient's daughter due to dementia.  Risks and benefits discussed with the patient including, but not limited to bleeding, infection, damage to adjacent structures or low yield requiring additional tests. All of the patient's questions were answered, patient is agreeable to proceed. Consent signed and in chart.   Thank you for this interesting consult.  I greatly enjoyed meeting [name] and  look forward to participating in their care.  A copy of this report was sent to the requesting provider on this date.  Electronically Signed: Elwin Mocha, PA-C 02/01/2018, 1:37 PM   I spent a total of 40 Minutes in face to face in clinical consultation, greater than 50% of which was counseling/coordinating care for T8/9 and T9/10 discitis.

## 2018-02-01 NOTE — Progress Notes (Signed)
PROGRESS NOTE  [name]  ZOX:[id number] DOB: Mar 29, 1930 DOA: 01/30/2018 PCP: Malka So., MD   Brief Narrative: [name] is an 85 y.o. 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  Patient has apparently been having these episodes of confusion on and off for a while.  Today however he had syncopal episode.  When EMS arrived blood pressure was 72/40.  He received up to 700 cc bolus of normal saline and brought to the ER where he remained hypotensive with systolic in the 80s.  He received couple of liters of IV fluid and systolic is up to 120 at the moment.  Patient has reportedly been nauseated in the last couple of days has not been eating or drinking adequately.  No diarrhea no vomiting.  He has some chest discomfort with this.  But throughout the week his confusion has progressively worsened.  Patient is therefore being diagnosed with syncopal episode hypotension and possible arrhythmia.  He is still in atrial fibrillation but the rate is controlled and he is fully anticoagulated..  ED Course: Initial blood pressure was 85/61 in the ER with a temperature 97.5 pulse 107 respiratory 21 oxygen sat 79% on room air he is currently 100% on 2 L.  White count is 4.0 hemoglobin 7.9 and platelets are clumped.  He has sodium 141 potassium 3.0 chloride 118 and CO2 20 BUN 20 creatinine 1.1 and calcium 6.5 with troponin 0 0.03.  Lactic acid was 1.22 fecal occult blood was negative x2.  Head CT without contrast is negative and chest x-ray showed no significant findings.  Hospital Course: MRI brain showed no acute stroke. Blood pressure has improved with fluids. No perfusion-limiting arrhythmias on telemetry. Due to history including subacute onset of  left lower extremity paresis in addition to RLE as well as bowel and bladder incontinence, MRI of the thoracic and lumbar spine was performed 01/31/2018 which demonstrated osteomyelitis/discitis at T8-9 and T9-10 with associated epidural phlegmon/abscess extending from T7-8 through T11-12, with severe spinal stenosis at the level of T9-10. Associated cord signal abnormality at the level of T9-10 compatible with edema and/or myelomalacia. Also noted possible mild/early discitis at L4-5 and L5-S1 without appreciable epidural collection or other abnormality. Sequelae of chronic discitis osteomyelitis at T12-L1 without evidence for significant or residual or recurrent infection. Small focus of chronic myelomalacia within the distal cord at this level.  Neurosurgery was consulted, felt that the patient's potential benefit from surgery would be marginal with months of symptoms and preexisting debility which would limit rehabilitation efforts, and risks are prohibitive. IR has been consulted for biopsy/aspiration for culture to guide antimicrobial therapy.  Assessment & Plan: Principal Problem:   Hypotension Active Problems:   Atrial fibrillation (HCC)   Hypothyroidism   Type 2 diabetes mellitus with complication (HCC)   Other chronic pain   Alzheimer's type dementia (HCC)   Syncope  Hypotension: Possible orthostatic/hypotensive syncope. While the patient has an infection, there are no other indications of SIRS to suggest that hypotension was attributable solely to infection. Pt at very high risk for arrhythmia however. Has improved with hydration. Cortisol sufficient. Echocardiogram without low CO.  - Monitor.  Osteomyelitis/discitis at T8-9 and T9-10 with associated epidural phlegmon/abscess extending from T7-8 through T11-12. Also noted possible mild/early discitis at L4-5 and L5-S1  without appreciable epidural collection or other abnormality. - Interventional radiology consulted for assistance  obtaining tissue for culture to guide antibiotics (disc aspiration vs. bone biopsy). Made NPO p MN, hold xarelto. - As discussed with patient and family, he is not exhibiting signs or symptoms of SIRS/decompensation to warrant empiric antibiotics. To maximize biopsy yield, will not start abx at this time.  Severe spinal stenosis at the level of T9-10 with associated cord edema and/or myelomalacia. Also with myelomalacia distally associated with prior osteo/discitis.  - Dr. Marcia Brash, neurosurgery, evaluated the patient 1/7 recommending against surgery.  - Continue PT/OT. Pt and family relieved by certainty of diagnosis despite its minimal chance for recovery. - Associated spasms to be treated with tizanidine, ropinirole, iron supplementation in the event RLS is contributing.   History of discitis, spinal (thoracolumbar) osteomyelitis and recurrent MSSA infection including right hip: MRI demonstrated sequelae of chronic discitis osteomyelitis at T12-L1 without evidence for significant or residual or recurrent infection.  - Tx as above.  Nitrite-positive pyuria: Unclear significance, though ?if lower abdominal spasms constitute symptoms of UTI.  - Check urine culture and continue to monitor for now.  Anemia of chronic disease and iron deficiency anemia: Reportedly has need transfusions in the past for this, no bleeding, FOBT negative x2. Initial hemoglobin possibly spuriously low at 7.9, up to 10.8 > 10.5 despite IV fluids.  - Monitor and transfuse prn symptoms or hgb 7g/dl - Continue iron supplementation  CKD stage III: CrCl remains >67ml/min currently.  - Taking PO well, so will stop IVF's to avoid iatrogenic overload. Will restart at MN once NPO. - Avoid nephrotoxins as able - Monitor BMP daily  T2DM:  - Continue SSI, pioglitazone  Hypothyroidism: TSH 1.613.  - Continue synthroid  Atrial fibrillation: Rate controlled.  - Xarelto to resume per IR. - Continue telemetry  Hypokalemia:  Resolved with replacement. - Monitor intermittently  Alzheimer's dementia:  - Continue home regimen, needs supervisory care. Mental status generally best during the mornings.  HLD:  - Continue statin  Morbid obesity: BMI 35 - Weight loss recommended  DVT prophylaxis: SCDs, holding xarelto for procedure. Code Status: Full Family Communication: Daughter at bedside Disposition Plan: SNF  Consultants:   None  Procedures:   Echocardiogram 02/01/2018: - Left ventricle: The cavity size was normal. There was severe   focal basal hypertrophy. Systolic function was normal. The   estimated ejection fraction was in the range of 55% to 60%. Wall   motion was normal; there were no regional wall motion   abnormalities. The study was not technically sufficient to allow   evaluation of LV diastolic dysfunction due to atrial   fibrillation. - Aortic valve: Trileaflet; normal thickness, mildly calcified   leaflets. There was trivial regurgitation. - Aorta: Aortic root dimension: 40 mm (ED). Ascending aortic   diameter: 38 mm (S). - Aortic root: The aortic root was mildly dilated. - Ascending aorta: The ascending aorta was mildly dilated. - Left atrium: The atrium was mildly dilated. - Pericardium, extracardiac: A trivial, free-flowing pericardial   effusion was identified circumferential to the heart. The fluid   had no internal echoes.  Antimicrobials:  None   Subjective: Very lucid this morning, called daughter to discuss diagnosis and prognosis from discussions with neurosurgery. Less oriented later this morning with daughter at bedside. Spasms are really bothering him. No right hip pain or other issues. Denies fever, chills, low back pain.   Objective: Vitals:   01/31/18 1753 01/31/18 2233 02/01/18 0500 02/01/18 1610  BP: 113/68 130/71  (!) 108/49  Pulse: 84 63  91  Resp:  12    Temp: 98 F (36.7 C) 97.9 F (36.6 C)  97.8 F (36.6 C)  TempSrc: Oral Oral  Oral  SpO2: 99% 97%   98%  Weight:   114.3 kg   Height:        Intake/Output Summary (Last 24 hours) at 02/01/2018 1330 Last data filed at 02/01/2018 0630 Gross per 24 hour  Intake 477 ml  Output 1201 ml  Net -724 ml   Filed Weights   01/30/18 2032 01/31/18 0500 02/01/18 0500  Weight: 111.2 kg 115.2 kg 114.3 kg   Gen: Elderly, frail male in no distress Pulm: Nonlabored breathing room air. Clear. CV: Irreg irreg, rate in 70's. No murmur, rub, or gallop. No JVD, trace, symmetric dependent edema. GI: Abdomen soft, not significantly tender, non-distended, with normoactive bowel sounds.  Ext: Warm, stable flexions of left hip and knee with external hip rotation.  Skin: No new rashes, lesions or ulcers on visualized skin.  Neuro: Alert, conversant but incompletely oriented. Intermittent L > R ankle dorsiflexion but no clonus. Otherwise no strength in lower extremities. Psych: Judgement and insight appear impaired. Mood euthymic & affect congruent. Behavior is appropriate.    Data Reviewed: I have personally reviewed following labs and imaging studies  CBC: Recent Labs  Lab 01/30/18 1620 01/31/18 1143 02/01/18 0626  WBC 4.0 7.5 6.1  NEUTROABS 2.5  --   --   HGB 7.9* 10.8* 10.5*  HCT 26.4* 35.0* 33.8*  MCV 100.4* 97.0 97.4  PLT PLATELET CLUMPS NOTED ON SMEAR, COUNT APPEARS DECREASED 157 147*   Basic Metabolic Panel: Recent Labs  Lab 01/30/18 1620 01/31/18 1000 02/01/18 0626  NA 141 140 141  K 3.1* 4.1 4.4  CL 118* 111 111  CO2 20* 22 24  GLUCOSE 93 91 129*  BUN 20 18 16   CREATININE 1.11 1.14 1.07  CALCIUM 6.5* 8.4* 8.5*   GFR: Estimated Creatinine Clearance: 64.9 mL/min (by C-G formula based on SCr of 1.07 mg/dL). Liver Function Tests: Recent Labs  Lab 01/30/18 1620 01/31/18 1000  AST 11* 15  ALT 7 9  ALKPHOS 50 63  BILITOT 0.4 0.6  PROT 4.2* 5.8*  ALBUMIN 2.1* 2.9*   No results for input(s): LIPASE, AMYLASE in the last 168 hours. No results for input(s): AMMONIA in the last 168  hours. Coagulation Profile: No results for input(s): INR, PROTIME in the last 168 hours. Cardiac Enzymes: Recent Labs  Lab 01/30/18 2045 01/31/18 0249 01/31/18 1000  TROPONINI 0.03* 0.03* 0.04*   BNP (last 3 results) No results for input(s): PROBNP in the last 8760 hours. HbA1C: No results for input(s): HGBA1C in the last 72 hours. CBG: Recent Labs  Lab 01/31/18 2240 02/01/18 0218 02/01/18 0620 02/01/18 0753 02/01/18 1139  GLUCAP 92 98 130* 106* 83   Lipid Profile: No results for input(s): CHOL, HDL, LDLCALC, TRIG, CHOLHDL, LDLDIRECT in the last 72 hours. Thyroid Function Tests: Recent Labs    01/31/18 0249  TSH 1.613   Anemia Panel: No results for input(s): VITAMINB12, FOLATE, FERRITIN, TIBC, IRON, RETICCTPCT in the last 72 hours. Urine analysis:    Component Value Date/Time   COLORURINE YELLOW 01/31/2018 2048   APPEARANCEUR CLOUDY (A) 01/31/2018 2048   LABSPEC 1.015 01/31/2018 2048   PHURINE 5.0 01/31/2018 2048   GLUCOSEU NEGATIVE 01/31/2018 2048   HGBUR NEGATIVE 01/31/2018 2048   BILIRUBINUR NEGATIVE 01/31/2018 2048   KETONESUR NEGATIVE 01/31/2018  2048   PROTEINUR 30 (A) 01/31/2018 2048   UROBILINOGEN 0.2 12/30/2012 2015   NITRITE POSITIVE (A) 01/31/2018 2048   LEUKOCYTESUR LARGE (A) 01/31/2018 2048   Recent Results (from the past 240 hour(s))  Blood Culture (routine x 2)     Status: None (Preliminary result)   Collection Time: 01/30/18  4:27 PM  Result Value Ref Range Status   Specimen Description BLOOD LEFT ANTECUBITAL  Final   Special Requests   Final    BOTTLES DRAWN AEROBIC AND ANAEROBIC Blood Culture adequate volume   Culture   Final    NO GROWTH 2 DAYS Performed at [facility], [address] [place], [place] [address]    Report Status PENDING  Incomplete  Blood Culture (routine x 2)     Status: None (Preliminary result)   Collection Time: 01/30/18  4:32 PM  Result Value Ref Range Status   Specimen Description BLOOD RIGHT ANTECUBITAL   Final   Special Requests   Final    BOTTLES DRAWN AEROBIC AND ANAEROBIC Blood Culture adequate volume   Culture   Final    NO GROWTH 2 DAYS Performed at [facility], [address] [place], [place] [address]    Report Status PENDING  Incomplete  MRSA PCR Screening     Status: None   Collection Time: 01/31/18 10:50 PM  Result Value Ref Range Status   MRSA by PCR NEGATIVE NEGATIVE Final    Comment:        The GeneXpert MRSA Assay (FDA approved for NASAL specimens only), is one component of a comprehensive MRSA colonization surveillance program. It is not intended to diagnose MRSA infection nor to guide or monitor treatment for MRSA infections. Performed at [facility], [address] [place], [place] [address]       Radiology Studies: Ct Head Wo Contrast  Result Date: 01/30/2018 CLINICAL DATA:  Chest heaviness and altered mental status today. EXAM: CT HEAD WITHOUT CONTRAST TECHNIQUE: Contiguous axial images were obtained from the base of the skull through the vertex without intravenous contrast. COMPARISON:  Head CT 11/03/2017.  Brain MRI 11/04/2017. FINDINGS: Brain: No evidence of acute infarction, hemorrhage, hydrocephalus, extra-axial collection or mass lesion/mass effect. Atrophy and chronic microvascular ischemic change noted. Vascular: Atherosclerosis is seen. Skull: Intact.  No focal lesion. Sinuses/Orbits: Scattered ethmoid air cell disease and mild mucosal thickening in the maxillary sinuses noted. The patient is status post cataract surgery. Other: None. IMPRESSION: No acute abnormality. Atrophy and chronic microvascular ischemic change. Mild maxillary sinus and ethmoid air cell disease. Atherosclerosis. Electronically Signed   By: Drusilla Kannerhomas  Dalessio M.D.   On: 01/30/2018 17:29   Mr Brain Wo Contrast  Result Date: 01/31/2018 CLINICAL DATA:  Sudden onset of confusion and loss of consciousness. EXAM: MRI HEAD WITHOUT CONTRAST TECHNIQUE: Multiplanar, multiecho pulse  sequences of the brain and surrounding structures were obtained without intravenous contrast. COMPARISON:  Head CT 01/30/2018.  MRI 11/04/2017. FINDINGS: Brain: Diffusion imaging does not show any acute or subacute infarction. There is generalized atrophy. No focal insult affects the brainstem or cerebellum. There is an incidental venous angioma in the left inferior pons. There are mild chronic small-vessel ischemic changes of the cerebral hemispheric white matter. No cortical or large vessel territory infarction. No mass lesion, hemorrhage, hydrocephalus or extra-axial collection. Vascular: Major vessels at the base of the brain show flow. Skull and upper cervical spine: Negative Sinuses/Orbits: Clear/normal Other: None IMPRESSION: No acute finding. Atrophy and chronic small-vessel  ischemic changes as outlined above. Electronically Signed   By: Paulina Fusi M.D.   On: 01/31/2018 09:10   Mr Lumbar Spine Wo Contrast  Addendum Date: 02/01/2018   ADDENDUM REPORT: 02/01/2018 07:24 ADDENDUM: Findings were communicated by telephone to the nurse practitioner taking care of the patient Jimmye Norman at 12:43 a.m. on 02/01/2017. Electronically Signed   By: Rise Mu M.D.   On: 02/01/2018 07:24   Result Date: 02/01/2018 CLINICAL DATA:  85 year old male with history of osteomyelitis discitis, status post thoracic fusion, now with new lower extremity weakness. EXAM: MRI THORACIC AND LUMBAR SPINE WITHOUT AND WITH CONTRAST TECHNIQUE: Multiplanar and multiecho pulse sequences of the thoracic and lumbar spine were obtained without and with intravenous contrast. CONTRAST:  10 cc of Gadavist. COMPARISON:  Comparison made with prior MRI from 01/10/2015. FINDINGS: MRI THORACIC SPINE FINDINGS Alignment: Limited sagittal imaging of the cervical spine grossly unremarkable other than for degenerative spondylolysis and facet arthrosis. No significant stenosis. Stable thoracic alignment with mild straightening of the  normal thoracic kyphosis. Trace chronic anterolisthesis of T12 on L1, stable from previous. Vertebrae: Similar bulky osteophytic spurring or with suspected ankylosis again seen throughout the thoracic spine. Abnormal fluid density within the T9-T10 interspace with adjacent endplate edema and enhancement, concerning for osteomyelitis discitis. There has been mildly progressive height loss at the anterior inferior T9 vertebral body, with progressive height loss at the superior endplate of T10 as compared to previous exam. Pocket of fluid and/or disc material seen eroding into the mid aspect of the T9 vertebral body (series 18, image 14). Findings compatible with acute osteomyelitis discitis. Edema and enhancement extends into the bilateral T9-10 facets, likely reflecting associated septic arthritis. There is additional abnormal fluid signal intensity with enhancement within the T8-9 interspace, consistent with osteomyelitis discitis as well. Vertebral body heights relatively maintained at this level without significant height loss. Changes compatible with chronic osteomyelitis seen about the T12-L1 level without evidence for significant active infection at this time. There has been interval loss of disc height with partial ankylosis of the T12 and L1 vertebral body since previous exam. Fluid signal intensity without associated endplate edema or enhancement noted at the C7-T1 space, favored to be degenerative in nature (series 18, image 15). Underlying bone marrow signal intensity mildly heterogeneous but within normal limits. No worrisome osseous lesions. Cord: Abnormal cord signal abnormality seen within the thoracic spinal cord extending from T8-9 through T10-11, concerning for edema and/or myelomalacia due to compressive stenosis. Small focus of chronic myelomalacia noted at the T12-L1 level as well (series 17, image 10). Signal intensity within the thoracic spinal cord otherwise within normal limits. Paraspinal  and other soft tissues: Paraspinous edema and phlegmon seen about the T8-9 and T9-10 interspaces. No discrete paraspinous or soft tissue abscess identified. Small layering bilateral pleural effusions noted. Dolichoectatic intrathoracic aorta with associated atherosclerotic change. Disc levels: Abnormal epidural enhancement seen extending from the T7-8 level through the T11-12 levels related to T8-9 and T9-10 osteomyelitis discitis (series 32, image 7). There is circumferential epidural phlegmon/abscess at the T8-9 and T9-10 levels with resultant up to severe spinal stenosis. This is most pronounced at the level of T9 where the thecal sac is compressed measuring 6 mm in transverse diameter (series 20, image 26). Associated edema and/or myelomalacia as above. Partial ankylosis at T12-L1 with trace anterolisthesis. Resultant circumferential disc osteophyte mildly flattens and indents the ventral thecal sac. Moderate facet hypertrophy at this level with resultant mild spinal stenosis. Patient status post prior  posterior decompression at this level. No other significant stenosis within the thoracic spine. MRI LUMBAR SPINE FINDINGS Segmentation: Transitional lumbosacral anatomy with partial lumbarization of the S1 segment. Same numbering system employed as on previous exams. Alignment: Straightening with reversal of the normal lordosis at the thoracolumbar junction, stable. Trace anterolisthesis of T12 on L1, stable and chronic. Vertebrae: Bulky endplate osteophytic spurring with ankylosis again seen throughout the lumbar spine with multilevel ankylosis, most notable at L2 through L4. Prior posterior decompression at the T12-L1 level. Bone marrow signal intensity heterogeneous without worrisome osseous lesions. Changes compatible with chronic/burned-out osteomyelitis at the T12-L1 level with ankylosis. Small amount of fluid signal seen within the anterolateral aspect of the L4-5 disc space without associated endplate  edema. Fluid signal also seen within the L5-S1 disc space with small amount of reactive endplate edema, most notable on the left (series 28, image 11). Abnormal marrow edema present about the left L5-S1 facet (series 28, image 11). Conus medullaris: Extends to the L1-2. Small focus of chronic myelomalacia at the level of T12-L1 (series 27, image 7). Conus medullaris otherwise within normal limits. Paraspinal and other soft tissues: Chronic fatty atrophy noted within the posterior paraspinous soft tissues as well as the right psoas muscle. No paraspinous collection. Chronic bilateral renal atrophy partially visualized. Atherosclerotic change noted within the intra-abdominal aorta. Disc levels: L1-2: Seen only on sagittal projection. Diffuse disc bulge with disc desiccation and intervertebral disc space narrowing. Bilateral facet hypertrophy. Mild canal narrowing without compressive stenosis. Foramina remain patent. L2-3: Ankylosis of the L2 and L3 disc space. Posterior osteophytic ridging flattens and indents the ventral thecal sac, greater on the right. Mild facet ligament flavum hypertrophy. Resultant mild canal with right lateral recess stenosis. Foramina remain patent. L3-4: Chronic ankylosis with loss of the L3-4 disc space. Posterior osseous ridging mildly flattens the ventral thecal sac. Moderate bilateral facet hypertrophy. No significant canal stenosis. Mild bilateral foraminal narrowing. L4-5: Chronic intervertebral disc space narrowing with partial ankylosis of the L4 and L5 vertebral bodies. Moderate facet hypertrophy. Prior posterior decompression. Residual mild canal with bilateral lateral recess narrowing. Mild left with mild to moderate right L4 foraminal stenosis. L5-S1: Chronic intervertebral disc space narrowing with diffuse disc bulge and disc desiccation. Reactive endplate osteophytic spurring. Severe left with mild right facet hypertrophy. Ligamentum flavum thickening. Prior posterior  decompression. Persistent severe canal with bilateral lateral recess stenosis. Moderate right with severe left L5 foraminal narrowing. IMPRESSION: 1. Findings consistent with osteomyelitis discitis at T8-9 and T9-10. Associated epidural phlegmon/abscess extending from T7-8 through T11-12, with severe spinal stenosis at the level of T9-10. Associated cord signal abnormality at the level of T9-10 compatible with edema and/or myelomalacia. Neuro surgical consultation recommended. 2. Probable associated facet arthritis at T9-10. 3. Possible mild/early discitis at L4-5 and L5-S1 without appreciable epidural collection or other abnormality. Edema about the left L5-S1 facet favored to be related to facet arthritis. 4. Sequelae of chronic discitis osteomyelitis at T12-L1 without evidence for significant or residual or recurrent infection. Small focus of chronic myelomalacia within the distal cord at this level. 5. Multifactorial degenerative changes at L5-S1 with resultant severe canal, with moderate to severe left greater than right foraminal stenosis. 6. Fluid signal intensity within the C7-T1 disc without appreciable enhancement or endplate changes, favored to be degenerative. Attention at follow-up recommended. 7. Small layering bilateral pleural effusions. Current attempt being made to contact the ordering and/or covering physician. Results will be communicated as soon as possible. Electronically Signed: By: Rise Mu  M.D. On: 02/01/2018 00:33   Mr Thoracic Spine W Wo Contrast  Addendum Date: 02/01/2018   ADDENDUM REPORT: 02/01/2018 07:24 ADDENDUM: Findings were communicated by telephone to the nurse practitioner taking care of the patient Jimmye Norman at 12:43 a.m. on 02/01/2017. Electronically Signed   By: Rise Mu M.D.   On: 02/01/2018 07:24   Result Date: 02/01/2018 CLINICAL DATA:  85 year old male with history of osteomyelitis discitis, status post thoracic fusion, now with new  lower extremity weakness. EXAM: MRI THORACIC AND LUMBAR SPINE WITHOUT AND WITH CONTRAST TECHNIQUE: Multiplanar and multiecho pulse sequences of the thoracic and lumbar spine were obtained without and with intravenous contrast. CONTRAST:  10 cc of Gadavist. COMPARISON:  Comparison made with prior MRI from 01/10/2015. FINDINGS: MRI THORACIC SPINE FINDINGS Alignment: Limited sagittal imaging of the cervical spine grossly unremarkable other than for degenerative spondylolysis and facet arthrosis. No significant stenosis. Stable thoracic alignment with mild straightening of the normal thoracic kyphosis. Trace chronic anterolisthesis of T12 on L1, stable from previous. Vertebrae: Similar bulky osteophytic spurring or with suspected ankylosis again seen throughout the thoracic spine. Abnormal fluid density within the T9-T10 interspace with adjacent endplate edema and enhancement, concerning for osteomyelitis discitis. There has been mildly progressive height loss at the anterior inferior T9 vertebral body, with progressive height loss at the superior endplate of T10 as compared to previous exam. Pocket of fluid and/or disc material seen eroding into the mid aspect of the T9 vertebral body (series 18, image 14). Findings compatible with acute osteomyelitis discitis. Edema and enhancement extends into the bilateral T9-10 facets, likely reflecting associated septic arthritis. There is additional abnormal fluid signal intensity with enhancement within the T8-9 interspace, consistent with osteomyelitis discitis as well. Vertebral body heights relatively maintained at this level without significant height loss. Changes compatible with chronic osteomyelitis seen about the T12-L1 level without evidence for significant active infection at this time. There has been interval loss of disc height with partial ankylosis of the T12 and L1 vertebral body since previous exam. Fluid signal intensity without associated endplate edema or  enhancement noted at the C7-T1 space, favored to be degenerative in nature (series 18, image 15). Underlying bone marrow signal intensity mildly heterogeneous but within normal limits. No worrisome osseous lesions. Cord: Abnormal cord signal abnormality seen within the thoracic spinal cord extending from T8-9 through T10-11, concerning for edema and/or myelomalacia due to compressive stenosis. Small focus of chronic myelomalacia noted at the T12-L1 level as well (series 17, image 10). Signal intensity within the thoracic spinal cord otherwise within normal limits. Paraspinal and other soft tissues: Paraspinous edema and phlegmon seen about the T8-9 and T9-10 interspaces. No discrete paraspinous or soft tissue abscess identified. Small layering bilateral pleural effusions noted. Dolichoectatic intrathoracic aorta with associated atherosclerotic change. Disc levels: Abnormal epidural enhancement seen extending from the T7-8 level through the T11-12 levels related to T8-9 and T9-10 osteomyelitis discitis (series 32, image 7). There is circumferential epidural phlegmon/abscess at the T8-9 and T9-10 levels with resultant up to severe spinal stenosis. This is most pronounced at the level of T9 where the thecal sac is compressed measuring 6 mm in transverse diameter (series 20, image 26). Associated edema and/or myelomalacia as above. Partial ankylosis at T12-L1 with trace anterolisthesis. Resultant circumferential disc osteophyte mildly flattens and indents the ventral thecal sac. Moderate facet hypertrophy at this level with resultant mild spinal stenosis. Patient status post prior posterior decompression at this level. No other significant stenosis within the thoracic spine. MRI  LUMBAR SPINE FINDINGS Segmentation: Transitional lumbosacral anatomy with partial lumbarization of the S1 segment. Same numbering system employed as on previous exams. Alignment: Straightening with reversal of the normal lordosis at the  thoracolumbar junction, stable. Trace anterolisthesis of T12 on L1, stable and chronic. Vertebrae: Bulky endplate osteophytic spurring with ankylosis again seen throughout the lumbar spine with multilevel ankylosis, most notable at L2 through L4. Prior posterior decompression at the T12-L1 level. Bone marrow signal intensity heterogeneous without worrisome osseous lesions. Changes compatible with chronic/burned-out osteomyelitis at the T12-L1 level with ankylosis. Small amount of fluid signal seen within the anterolateral aspect of the L4-5 disc space without associated endplate edema. Fluid signal also seen within the L5-S1 disc space with small amount of reactive endplate edema, most notable on the left (series 28, image 11). Abnormal marrow edema present about the left L5-S1 facet (series 28, image 11). Conus medullaris: Extends to the L1-2. Small focus of chronic myelomalacia at the level of T12-L1 (series 27, image 7). Conus medullaris otherwise within normal limits. Paraspinal and other soft tissues: Chronic fatty atrophy noted within the posterior paraspinous soft tissues as well as the right psoas muscle. No paraspinous collection. Chronic bilateral renal atrophy partially visualized. Atherosclerotic change noted within the intra-abdominal aorta. Disc levels: L1-2: Seen only on sagittal projection. Diffuse disc bulge with disc desiccation and intervertebral disc space narrowing. Bilateral facet hypertrophy. Mild canal narrowing without compressive stenosis. Foramina remain patent. L2-3: Ankylosis of the L2 and L3 disc space. Posterior osteophytic ridging flattens and indents the ventral thecal sac, greater on the right. Mild facet ligament flavum hypertrophy. Resultant mild canal with right lateral recess stenosis. Foramina remain patent. L3-4: Chronic ankylosis with loss of the L3-4 disc space. Posterior osseous ridging mildly flattens the ventral thecal sac. Moderate bilateral facet hypertrophy. No  significant canal stenosis. Mild bilateral foraminal narrowing. L4-5: Chronic intervertebral disc space narrowing with partial ankylosis of the L4 and L5 vertebral bodies. Moderate facet hypertrophy. Prior posterior decompression. Residual mild canal with bilateral lateral recess narrowing. Mild left with mild to moderate right L4 foraminal stenosis. L5-S1: Chronic intervertebral disc space narrowing with diffuse disc bulge and disc desiccation. Reactive endplate osteophytic spurring. Severe left with mild right facet hypertrophy. Ligamentum flavum thickening. Prior posterior decompression. Persistent severe canal with bilateral lateral recess stenosis. Moderate right with severe left L5 foraminal narrowing. IMPRESSION: 1. Findings consistent with osteomyelitis discitis at T8-9 and T9-10. Associated epidural phlegmon/abscess extending from T7-8 through T11-12, with severe spinal stenosis at the level of T9-10. Associated cord signal abnormality at the level of T9-10 compatible with edema and/or myelomalacia. Neuro surgical consultation recommended. 2. Probable associated facet arthritis at T9-10. 3. Possible mild/early discitis at L4-5 and L5-S1 without appreciable epidural collection or other abnormality. Edema about the left L5-S1 facet favored to be related to facet arthritis. 4. Sequelae of chronic discitis osteomyelitis at T12-L1 without evidence for significant or residual or recurrent infection. Small focus of chronic myelomalacia within the distal cord at this level. 5. Multifactorial degenerative changes at L5-S1 with resultant severe canal, with moderate to severe left greater than right foraminal stenosis. 6. Fluid signal intensity within the C7-T1 disc without appreciable enhancement or endplate changes, favored to be degenerative. Attention at follow-up recommended. 7. Small layering bilateral pleural effusions. Current attempt being made to contact the ordering and/or covering physician. Results will  be communicated as soon as possible. Electronically Signed: By: Rise Mu M.D. On: 02/01/2018 00:33   Dg Chest [facility] 1 View  Result  Date: 01/30/2018 CLINICAL DATA:  Chest heaviness. Altered mental status. Hypotensive. History of bowel sign wrist disease. EXAM: PORTABLE CHEST 1 VIEW COMPARISON:  Chest radiographs October 25, 2017 FINDINGS: Stable cardiomegaly. Calcified aortic arch. Similar fullness of the pulmonary hila. No pleural effusion or focal consolidation in this low inspiratory examination with crowded vascular markings. No pneumothorax. Multiple EKG lines overlie the patient and may obscure subtle underlying pathology. Included soft tissue planes and osseous structures are non suspicious. IMPRESSION: 1. Stable cardiomegaly. No acute pulmonary process for this low inspiratory examination. 2.  Aortic Atherosclerosis (ICD10-I70.0). Electronically Signed   By: Awilda Metroourtnay  Bloomer M.D.   On: 01/30/2018 17:13    Scheduled Meds: . divalproex  125 mg Oral QHS  . docusate sodium  100 mg Oral BID  . escitalopram  15 mg Oral Daily  . ferrous sulfate  325 mg Oral Q breakfast  . finasteride  5 mg Oral QHS  . insulin aspart  0-15 Units Subcutaneous TID WC  . insulin aspart  0-5 Units Subcutaneous QHS  . lactulose  10 g Oral QHS  . lactulose  30 g Oral Daily  . levothyroxine  88 mcg Oral QAC breakfast  . lidocaine  1 patch Transdermal Q24H  . pioglitazone  15 mg Oral Daily  . pravastatin  40 mg Oral q1800  . rOPINIRole  0.5 mg Oral QHS  . sodium chloride flush  3 mL Intravenous Q12H   Continuous Infusions: . sodium chloride 75 mL/hr at 01/30/18 2234     LOS: 2 days   Time spent: 35 minutes.  Tyrone Nineyan B Mylin Hirano, MD [facility] [email or web address] Password [email or web address] 02/01/2018, 1:30 PM

## 2018-02-02 ENCOUNTER — Inpatient Hospital Stay (HOSPITAL_COMMUNITY): Payer: Medicare Other

## 2018-02-02 DIAGNOSIS — Z8619 Personal history of other infectious and parasitic diseases: Secondary | ICD-10-CM

## 2018-02-02 DIAGNOSIS — Z96641 Presence of right artificial hip joint: Secondary | ICD-10-CM

## 2018-02-02 DIAGNOSIS — G309 Alzheimer's disease, unspecified: Secondary | ICD-10-CM

## 2018-02-02 DIAGNOSIS — I4891 Unspecified atrial fibrillation: Secondary | ICD-10-CM

## 2018-02-02 DIAGNOSIS — E119 Type 2 diabetes mellitus without complications: Secondary | ICD-10-CM

## 2018-02-02 DIAGNOSIS — Z792 Long term (current) use of antibiotics: Secondary | ICD-10-CM

## 2018-02-02 DIAGNOSIS — M4625 Osteomyelitis of vertebra, thoracolumbar region: Secondary | ICD-10-CM

## 2018-02-02 DIAGNOSIS — G062 Extradural and subdural abscess, unspecified: Secondary | ICD-10-CM

## 2018-02-02 DIAGNOSIS — M4645 Discitis, unspecified, thoracolumbar region: Secondary | ICD-10-CM

## 2018-02-02 DIAGNOSIS — F0280 Dementia in other diseases classified elsewhere without behavioral disturbance: Secondary | ICD-10-CM

## 2018-02-02 LAB — PROTIME-INR
INR: 1.3
Prothrombin Time: 16.1 seconds — ABNORMAL HIGH (ref 11.4–15.2)

## 2018-02-02 LAB — GLUCOSE, CAPILLARY
GLUCOSE-CAPILLARY: 69 mg/dL — AB (ref 70–99)
Glucose-Capillary: 109 mg/dL — ABNORMAL HIGH (ref 70–99)
Glucose-Capillary: 119 mg/dL — ABNORMAL HIGH (ref 70–99)
Glucose-Capillary: 81 mg/dL (ref 70–99)
Glucose-Capillary: 83 mg/dL (ref 70–99)
Glucose-Capillary: 83 mg/dL (ref 70–99)
Glucose-Capillary: 83 mg/dL (ref 70–99)

## 2018-02-02 LAB — CBC
HCT: 32.9 % — ABNORMAL LOW (ref 39.0–52.0)
Hemoglobin: 10.1 g/dL — ABNORMAL LOW (ref 13.0–17.0)
MCH: 29.4 pg (ref 26.0–34.0)
MCHC: 30.7 g/dL (ref 30.0–36.0)
MCV: 95.6 fL (ref 80.0–100.0)
PLATELETS: 144 10*3/uL — AB (ref 150–400)
RBC: 3.44 MIL/uL — AB (ref 4.22–5.81)
RDW: 14.9 % (ref 11.5–15.5)
WBC: 6.5 10*3/uL (ref 4.0–10.5)
nRBC: 0 % (ref 0.0–0.2)

## 2018-02-02 LAB — BASIC METABOLIC PANEL
ANION GAP: 4 — AB (ref 5–15)
BUN: 15 mg/dL (ref 8–23)
CALCIUM: 8.6 mg/dL — AB (ref 8.9–10.3)
CO2: 24 mmol/L (ref 22–32)
Chloride: 111 mmol/L (ref 98–111)
Creatinine, Ser: 1.02 mg/dL (ref 0.61–1.24)
GFR calc Af Amer: >60 mL/min (ref >60)
GFR calc non Af Amer: >60 mL/min (ref >60)
Glucose, Bld: 99 mg/dL (ref 70–99)
Potassium: 4.4 mmol/L (ref 3.5–5.1)
Sodium: 139 mmol/L (ref 135–145)

## 2018-02-02 MED ORDER — PREGABALIN 25 MG PO CAPS
25.0000 mg | ORAL_CAPSULE | Freq: Every day | ORAL | Status: DC
  Administered 2018-02-02 – 2018-02-03 (×2): 25 mg via ORAL
  Filled 2018-02-02 (×2): qty 1

## 2018-02-02 MED ORDER — MIDAZOLAM HCL 2 MG/2ML IJ SOLN
INTRAMUSCULAR | Status: AC
  Filled 2018-02-02: qty 2

## 2018-02-02 MED ORDER — FENTANYL CITRATE (PF) 100 MCG/2ML IJ SOLN
INTRAMUSCULAR | Status: AC
  Filled 2018-02-02: qty 2

## 2018-02-02 NOTE — Consult Note (Signed)
[facility]    Date of Admission:  01/30/2018     Total days of antibiotics 0               Reason for Consult: Osteomyelitis/Discitis   Referring Provider: Jarvis [name] Primary Care Provider: Malka So., MD   Assessment/Plan:  [name] is an 85 y/o male with previous history of prosthetic hip joint infection and thoracic/lumbar discitis/osteomyelitis presenting with worsening confusion in the setting of dementia and syncope. Now with osteomyelitis/discitis of the thoracic/lumbar spine and new epidural abscess/phelgmon. Neurosurgery with no surgical intervention and IR unable to perform aspiration today. It would be ideal if aspiration were able to be completed. He has been on Keflex prior to admission which if recurrent MSSA is the organism, it may not appear on cultures. Discussed treatment options with patient and family and will determine if we wish to pursue aspiration with general sedation versus broad spectrum antimicrobial therapy.  1. Continue to monitor off antibiotics. 2. [name] will speak with family after they discuss the treatment options.  3. Continue to monitor for fevers, elevating WBC count or systemic symptoms.    Principal Problem:   Epidural abscess Active Problems:   Osteomyelitis of thoracic spine (HCC)   Atrial fibrillation (HCC)   Hypothyroidism   Type 2 diabetes mellitus with complication (HCC)   Other chronic pain   Alzheimer's type dementia (HCC)   Hypotension   Syncope   . divalproex  125 mg Oral QHS  . docusate sodium  100 mg Oral BID  . escitalopram  15 mg Oral Daily  . ferrous sulfate  325 mg Oral Q breakfast  . finasteride  5 mg Oral QHS  . insulin aspart  0-15 Units Subcutaneous TID WC  . insulin aspart  0-5 Units Subcutaneous QHS  . lactulose  10 g Oral QHS  . lactulose  30 g Oral Daily  . levothyroxine  88 mcg Oral QAC breakfast  . lidocaine  1 patch Transdermal Q24H  . pioglitazone  15 mg Oral  Daily  . pravastatin  40 mg Oral q1800  . rOPINIRole  0.5 mg Oral QHS  . sodium chloride flush  3 mL Intravenous Q12H     HPI: [name] is a 85 y.o. male with previous medical history of Alzheimer's dementia, atrial fibrillation s/p pacemaker placement and removal, diabetes and recurrent MSSA infection presenting from [facility] with worsening confusion over the past week and new onset syncope.   [name] has been previously followed by our partner, [name], and was initially seen in August 2014 with MSSA bacteremia and a large retroperitoneal abscess adjacent to his prosthetic hip.  TEE at the time with no vegetations.  He completed 8 weeks of cefazolin along with oral rifampin twice daily.  He was then changed to Keflex with continued rifampin.  In the fall 2015 he developed right leg pain and weakness and found to have acute discitis/osteomyelitis at T9-10, T12-L1 with early discitis at T8-9 and L4-L5 with no epidural or paraspinal abscesses. Once again treated with 8 weeks of IV ancef and continued on Keflex.   [name] has been afebrile without leukocytosis. Initially hypotensive in the ED which was corrected with fluids. Blood cultures drawn on 01/30/18 without growth to date.  MRI imaging of the thoracic and lumbar spine with osteomyelitis/discitis at T8-T9 and T9-T10 with associated epidural abscess extending from T7-T8 through T11-12.  There was associated cord signal abnormality at  the level of T9-10 compatible with edema and/or myelomalacia.  There was possible mild/early discitis at L4-L5 and L5-S1 without fluid collection.  Sequela of chronic discitis/osteomyelitis at T12-L1 was noted.  Neurosurgery was consulted with no surgical intervention at this time and referred to IR.  IR attempted aspiration and unable to successfully complete procedure secondary to back spasms and decreased oxygen saturation.  He is not currently on antimicrobial therapy..  [name] has been  taking his Keflex as prescribed since he was last seen by Dr. Daiva EvesVan [name]. Denies fevers, chills or sweats. Pain is located in the lower back and right shoulder. Continues to have difficulty with moving his lower extremities.   Review of Systems: Review of Systems  Unable to perform ROS: Dementia     Past Medical History:  Diagnosis Date  . Alzheimer's type dementia (HCC) 12/07/2016  . Anemia   . Anxiety   . Arthritis   . Bacteremia   . Bradycardia    a. PPM removed in 2014 due to bacteremia.  . Chronic atrial fibrillation   . Chronic right shoulder pain 06/17/2015  . CKD (chronic kidney disease), stage III (HCC)   . Complication of anesthesia    " Terrible Nightmares"  . Constipation   . Depression   . Diabetes mellitus without complication (HCC)    Type II  . DVT (deep venous thrombosis) (HCC)    a. in 2019 - pt reports behind knee - was on Xarelto 15mg  BID now 20mg  daily.  . Encounter for removal of cardiac resynchronization therapy pacemaker 2017  . Grief 06/17/2015  . History of blood product transfusion   . Hypertension   . Hypothyroidism   . Infected pacemaker (HCC)   . Major depression 06/17/2015  . MSSA (methicillin susceptible Staphylococcus aureus) infection   . OAB (overactive bladder)   . Overactive bladder   . Septic hip (HCC)   . Septicemia (HCC)   . Severe depression (HCC) 12/07/2016  . Staphylococcus aureus infection   . Thoracic spine pain 11/28/2014    Social History   Tobacco Use  . Smoking status: Never Smoker  . Smokeless tobacco: Never Used  Substance Use Topics  . Alcohol use: No    Alcohol/week: 0.0 standard drinks  . Drug use: No    Family History  Problem Relation Age of Onset  . Heart failure Mother     Allergies  Allergen Reactions  . Ace Inhibitors Cough  . Gabapentin Other (See Comments)    Unknown reaction  . Nsaids Other (See Comments)    Renal insufficiency   . Zolpidem Other (See Comments)    hallucinations Nightmares,  irritable, nervous Disorientation    OBJECTIVE: Blood pressure (!) 148/81, pulse 71, temperature 98.4 F (36.9 C), temperature source Oral, resp. rate 18, height 5\' 11"  (1.803 m), weight 114.3 kg, SpO2 96 %.  Physical Exam Constitutional:      General: He is not in acute distress.    Appearance: He is well-developed.     Comments: Lying in the bed with head of bed elevated; pleasant and grateful.   Cardiovascular:     Rate and Rhythm: Normal rate and regular rhythm.     Heart sounds: Normal heart sounds.  Pulmonary:     Effort: Pulmonary effort is normal.     Breath sounds: Normal breath sounds. No wheezing, rhonchi or rales.  Abdominal:     General: Abdomen is flat. Bowel sounds are normal. There is no distension.  Palpations: Abdomen is soft.     Tenderness: There is no abdominal tenderness.  Skin:    General: Skin is warm and dry.  Neurological:     Mental Status: He is alert.     Lab Results Lab Results  Component Value Date   WBC 6.5 02/02/2018   HGB 10.1 (L) 02/02/2018   HCT 32.9 (L) 02/02/2018   MCV 95.6 02/02/2018   PLT 144 (L) 02/02/2018    Lab Results  Component Value Date   CREATININE 1.02 02/02/2018   BUN 15 02/02/2018   NA 139 02/02/2018   K 4.4 02/02/2018   CL 111 02/02/2018   CO2 24 02/02/2018    Lab Results  Component Value Date   ALT 9 01/31/2018   AST 15 01/31/2018   ALKPHOS 63 01/31/2018   BILITOT 0.6 01/31/2018     Microbiology: Recent Results (from the past 240 hour(s))  Blood Culture (routine x 2)     Status: None (Preliminary result)   Collection Time: 01/30/18  4:27 PM  Result Value Ref Range Status   Specimen Description BLOOD LEFT ANTECUBITAL  Final   Special Requests   Final    BOTTLES DRAWN AEROBIC AND ANAEROBIC Blood Culture adequate volume   Culture   Final    NO GROWTH 3 DAYS Performed at [facility], [address] [place], [place] [address]    Report Status PENDING  Incomplete  Blood Culture (routine x 2)      Status: None (Preliminary result)   Collection Time: 01/30/18  4:32 PM  Result Value Ref Range Status   Specimen Description BLOOD RIGHT ANTECUBITAL  Final   Special Requests   Final    BOTTLES DRAWN AEROBIC AND ANAEROBIC Blood Culture adequate volume   Culture   Final    NO GROWTH 3 DAYS Performed at [facility], [address] [place], [place] [address]    Report Status PENDING  Incomplete  MRSA PCR Screening     Status: None   Collection Time: 01/31/18 10:50 PM  Result Value Ref Range Status   MRSA by PCR NEGATIVE NEGATIVE Final    Comment:        The GeneXpert MRSA Assay (FDA approved for NASAL specimens only), is one component of a comprehensive MRSA colonization surveillance program. It is not intended to diagnose MRSA infection nor to guide or monitor treatment for MRSA infections. Performed at [facility], [address] [place], [place] [address]   Culture, Urine     Status: Abnormal (Preliminary result)   Collection Time: 02/01/18  2:28 PM  Result Value Ref Range Status   Specimen Description URINE, CATHETERIZED  Final   Special Requests   Final    NONE Performed at [facility], [address] [place], [place] [address]    Culture >=100,000 COLONIES/mL PROTEUS MIRABILIS (A)  Final   Report Status PENDING  Incomplete     Joshua EkeGreg Calone, NP [facility] [phone] Pager  02/02/2018  11:56 AM

## 2018-02-02 NOTE — Progress Notes (Signed)
PROGRESS NOTE  Joshua BrookeHarda [name]  JWJ:[id number]:[id number] DOB: [birth year] DOA: 01/30/2018 PCP: Malka SoJobe, Daniel B., MD   Brief Narrative: Joshua BrookeHarda Monsour is an 85 y.o. 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  Patient has apparently been having these episodes of confusion on and off for a while.  Today however he had syncopal episode.  When EMS arrived blood pressure was 72/40.  He received up to 700 cc bolus of normal saline and brought to the ER where he remained hypotensive with systolic in the 80s.  He received couple of liters of IV fluid and systolic is up to 120 at the moment.  Patient has reportedly been nauseated in the last couple of days has not been eating or drinking adequately.  No diarrhea no vomiting.  He has some chest discomfort with this.  But throughout the week his confusion has progressively worsened.  Patient is therefore being diagnosed with syncopal episode hypotension and possible arrhythmia.  He is still in atrial fibrillation but the rate is controlled and he is fully anticoagulated..  ED Course: Initial blood pressure was 85/61 in the ER with a temperature 97.5 pulse 107 respiratory 21 oxygen sat 79% on room air he is currently 100% on 2 L.  White count is 4.0 hemoglobin 7.9 and platelets are clumped.  He has sodium 141 potassium 3.0 chloride 118 and CO2 20 BUN 20 creatinine 1.1 and calcium 6.5 with troponin 0 0.03.  Lactic acid was 1.22 fecal occult blood was negative x2.  Head CT without contrast is negative and chest x-ray showed no significant findings.  Hospital Course: MRI brain showed no acute stroke. Blood pressure has improved with fluids. No perfusion-limiting arrhythmias on telemetry. Due to history including subacute onset of  left lower extremity paresis in addition to RLE as well as bowel and bladder incontinence, MRI of the thoracic and lumbar spine was performed 01/31/2018 which demonstrated osteomyelitis/discitis at T8-9 and T9-10 with associated epidural phlegmon/abscess extending from T7-8 through T11-12, with severe spinal stenosis at the level of T9-10. Associated cord signal abnormality at the level of T9-10 compatible with edema and/or myelomalacia. Also noted possible mild/early discitis at L4-5 and L5-S1 without appreciable epidural collection or other abnormality. Sequelae of chronic discitis osteomyelitis at T12-L1 without evidence for significant or residual or recurrent infection. Small focus of chronic myelomalacia within the distal cord at this level.  Neurosurgery was consulted, felt that the patient's potential benefit from surgery would be marginal with months of symptoms and preexisting debility which would limit rehabilitation efforts, and risks are prohibitive. IR has been consulted for biopsy/aspiration for culture to guide antimicrobial therapy.  Assessment & Plan: Principal Problem:   Epidural abscess Active Problems:   Atrial fibrillation (HCC)   Hypothyroidism   Osteomyelitis of thoracic spine (HCC)   Type 2 diabetes mellitus with complication (HCC)   Other chronic pain   Alzheimer's type dementia (HCC)   Hypotension   Syncope  Hypotension: Possible orthostatic/hypotensive syncope. While the patient has an infection, there are no other indications of SIRS to suggest that hypotension was attributable solely to infection. Pt at very high risk for arrhythmia however. Has improved with hydration. Cortisol sufficient. Echocardiogram without low CO.  - Monitor.  Osteomyelitis/discitis at T8-9 and T9-10 with associated epidural phlegmon/abscess extending from T7-8  through T11-12. Also noted possible mild/early discitis at L4-5 and L5-S1 without appreciable epidural collection or other  abnormality. - Interventional radiology with unsuccessful attempt at aspiration 1/8, aborted procedure prior to attempt due to back spasms and dyspnea. Discussed personally with Dr. Corliss Skains and Dr. Orvan Falconer, ID, this morning. Benefit of definitive diagnosis to guide future management outweighs risk, so will proceed with general anesthesia on 1/10 for aspiration. - Appreciate ID input.  Severe spinal stenosis at the level of T9-10 with associated cord edema and/or myelomalacia. Also with myelomalacia distally associated with prior osteo/discitis.  - Dr. Maurice Small, neurosurgery, evaluated the patient 1/7 recommending against surgery.  - Continue PT/OT. Pt and family relieved by certainty of diagnosis despite its minimal chance for recovery. - Associated spasms to be treated with tizanidine, ropinirole, iron supplementation in the event RLS is contributing. Has unknown reaction listed as allergy to gabapentin. Will trial lyrica. Will start at very low dose due to age, mild renal insufficiency, and coadministration with sedating medications.  History of discitis, spinal (thoracolumbar) osteomyelitis and recurrent MSSA infection including right hip: MRI demonstrated sequelae of chronic discitis osteomyelitis at T12-L1 without evidence for significant or residual or recurrent infection of the hip.  - Tx as above.  Nitrite-positive pyuria: Unclear significance, though ?if lower abdominal spasms constitute symptoms of UTI.  - Urine culture growing >100k Proteus. Would appreciate ID input regarding need for treatment. Going forward without antimicrobials due to hemodynamic stability currently.  Anemia of chronic disease and iron deficiency anemia: Reportedly has needed transfusions in the past for this, no bleeding, FOBT negative x2. Initial hemoglobin possibly spuriously low at 7.9, up to 10.8 > 10.5 despite IV fluids.  - Monitor and transfuse prn symptoms or hgb 7g/dl - Continue iron  supplementation  CKD stage III: CrCl remains >3ml/min currently, so may actually be stage II. Will continue monitoring.  - Taking PO well, so will stop IVF's with some pitting edema developing. - Avoid nephrotoxins as able - Monitor BMP daily  T2DM:  - Continue SSI, pioglitazone  Hypothyroidism: TSH 1.613.  - Continue synthroid  Atrial fibrillation: Rate controlled.  - Xarelto to resume per IR. - Continue telemetry  Hypokalemia: Resolved with replacement. - Monitor intermittently  Alzheimer's dementia:  - Continue home regimen, needs supervisory care. Mental status generally best during the mornings. - Not on medications at home.   HLD:  - Continue statin  Morbid obesity: BMI 35 - Weight loss recommended  DVT prophylaxis: SCDs  Code Status: Full Family Communication: Daughter at bedside Disposition Plan: SNF once work up and treatment complete. Anticipated DC date 1/13.  Consultants:   None  Procedures:   Echocardiogram 02/01/2018: - Left ventricle: The cavity size was normal. There was severe   focal basal hypertrophy. Systolic function was normal. The   estimated ejection fraction was in the range of 55% to 60%. Wall   motion was normal; there were no regional wall motion   abnormalities. The study was not technically sufficient to allow   evaluation of LV diastolic dysfunction due to atrial   fibrillation. - Aortic valve: Trileaflet; normal thickness, mildly calcified   leaflets. There was trivial regurgitation. - Aorta: Aortic root dimension: 40 mm (ED). Ascending aortic   diameter: 38 mm (S). - Aortic root: The aortic root was mildly dilated. - Ascending aorta: The ascending aorta was mildly dilated. - Left atrium: The atrium was mildly dilated. - Pericardium, extracardiac: A trivial, free-flowing pericardial   effusion was identified circumferential to the  heart. The fluid   had no internal echoes.  Antimicrobials:  None   Subjective: Discussed  with Dr. Corliss Skainseveshwar the events this morning including intolerable back spasms while laying prone and subsequent dyspnea and desaturations causing abortion of procedure. Afterwards, the patient is very tired with resolved dyspnea, no fevers. Leg/abd spasms remain intermittent, severe, sharp, not appreciably improved by medications to date.  Objective: Vitals:   02/01/18 2347 02/02/18 0346 02/02/18 0730 02/02/18 0748  BP: 127/74   (!) 148/81  Pulse: (!) 57   71  Resp:    18  Temp: 98.4 F (36.9 C)   98.4 F (36.9 C)  TempSrc: Oral   Oral  SpO2: 97%  96% 96%  Weight:  114.3 kg    Height:        Intake/Output Summary (Last 24 hours) at 02/02/2018 1648 Last data filed at 02/02/2018 1500 Gross per 24 hour  Intake 831.34 ml  Output 1100 ml  Net -268.66 ml   Filed Weights   01/31/18 0500 02/01/18 0500 02/02/18 0346  Weight: 115.2 kg 114.3 kg 114.3 kg   Gen: Elderly, tired-appearing male in no distress Pulm: Nonlabored breathing room air. Clear. CV: Irreg. No murmur, rub, or gallop. No JVD, 1+ pitting dependent edema. GI: Abdomen soft, non-tender, non-distended, with normoactive bowel sounds.  Ext: Warm. Left leg contracted, stable. Skin: No new rashes, lesions or ulcers on visualized skin. Neuro: Alert and oriented. Flaccid paraplegia. No new deficits. Psych: Judgement and insight appear impaired at this time.   Data Reviewed: I have personally reviewed following labs and imaging studies  CBC: Recent Labs  Lab 01/30/18 1620 01/31/18 1143 02/01/18 0626 02/02/18 0256  WBC 4.0 7.5 6.1 6.5  NEUTROABS 2.5  --   --   --   HGB 7.9* 10.8* 10.5* 10.1*  HCT 26.4* 35.0* 33.8* 32.9*  MCV 100.4* 97.0 97.4 95.6  PLT PLATELET CLUMPS NOTED ON SMEAR, COUNT APPEARS DECREASED 157 147* 144*   Basic Metabolic Panel: Recent Labs  Lab 01/30/18 1620 01/31/18 1000 02/01/18 0626 02/02/18 0256  NA 141 140 141 139  K 3.1* 4.1 4.4 4.4  CL 118* 111 111 111  CO2 20* 22 24 24   GLUCOSE 93 91 129*  99  BUN 20 18 16 15   CREATININE 1.11 1.14 1.07 1.02  CALCIUM 6.5* 8.4* 8.5* 8.6*   GFR: Estimated Creatinine Clearance: 68.1 mL/min (by C-G formula based on SCr of 1.02 mg/dL). Liver Function Tests: Recent Labs  Lab 01/30/18 1620 01/31/18 1000  AST 11* 15  ALT 7 9  ALKPHOS 50 63  BILITOT 0.4 0.6  PROT 4.2* 5.8*  ALBUMIN 2.1* 2.9*   No results for input(s): LIPASE, AMYLASE in the last 168 hours. No results for input(s): AMMONIA in the last 168 hours. Coagulation Profile: Recent Labs  Lab 02/02/18 0256  INR 1.30   Cardiac Enzymes: Recent Labs  Lab 01/30/18 2045 01/31/18 0249 01/31/18 1000  TROPONINI 0.03* 0.03* 0.04*   BNP (last 3 results) No results for input(s): PROBNP in the last 8760 hours. HbA1C: No results for input(s): HGBA1C in the last 72 hours. CBG: Recent Labs  Lab 02/01/18 1649 02/02/18 0012 02/02/18 0541 02/02/18 0748 02/02/18 1214  GLUCAP 79 119* 83 83 109*   Lipid Profile: No results for input(s): CHOL, HDL, LDLCALC, TRIG, CHOLHDL, LDLDIRECT in the last 72 hours. Thyroid Function Tests: Recent Labs    01/31/18 0249  TSH 1.613   Anemia Panel: No results for input(s): VITAMINB12, FOLATE, FERRITIN, TIBC,  IRON, RETICCTPCT in the last 72 hours. Urine analysis:    Component Value Date/Time   COLORURINE YELLOW 01/31/2018 2048   APPEARANCEUR CLOUDY (A) 01/31/2018 2048   LABSPEC 1.015 01/31/2018 2048   PHURINE 5.0 01/31/2018 2048   GLUCOSEU NEGATIVE 01/31/2018 2048   HGBUR NEGATIVE 01/31/2018 2048   BILIRUBINUR NEGATIVE 01/31/2018 2048   KETONESUR NEGATIVE 01/31/2018 2048   PROTEINUR 30 (A) 01/31/2018 2048   UROBILINOGEN 0.2 12/30/2012 2015   NITRITE POSITIVE (A) 01/31/2018 2048   LEUKOCYTESUR LARGE (A) 01/31/2018 2048   Recent Results (from the past 240 hour(s))  Blood Culture (routine x 2)     Status: None (Preliminary result)   Collection Time: 01/30/18  4:27 PM  Result Value Ref Range Status   Specimen Description BLOOD LEFT  ANTECUBITAL  Final   Special Requests   Final    BOTTLES DRAWN AEROBIC AND ANAEROBIC Blood Culture adequate volume   Culture   Final    NO GROWTH 3 DAYS Performed at [facility], [address] [place], [place] [address]    Report Status PENDING  Incomplete  Blood Culture (routine x 2)     Status: None (Preliminary result)   Collection Time: 01/30/18  4:32 PM  Result Value Ref Range Status   Specimen Description BLOOD RIGHT ANTECUBITAL  Final   Special Requests   Final    BOTTLES DRAWN AEROBIC AND ANAEROBIC Blood Culture adequate volume   Culture   Final    NO GROWTH 3 DAYS Performed at [facility], [address], [place], [place] [address]    Report Status PENDING  Incomplete  MRSA PCR Screening     Status: None   Collection Time: 01/31/18 10:50 PM  Result Value Ref Range Status   MRSA by PCR NEGATIVE NEGATIVE Final    Comment:        The GeneXpert MRSA Assay (FDA approved for NASAL specimens only), is one component of a comprehensive MRSA colonization surveillance program. It is not intended to diagnose MRSA infection nor to guide or monitor treatment for MRSA infections. Performed at [facility], [address] [place], [place] [address]   Culture, Urine     Status: Abnormal (Preliminary result)   Collection Time: 02/01/18  2:28 PM  Result Value Ref Range Status   Specimen Description URINE, CATHETERIZED  Final   Special Requests   Final    NONE Performed at [facility], [address] [place], [place] [address]    Culture >=100,000 COLONIES/mL PROTEUS MIRABILIS (A)  Final   Report Status PENDING  Incomplete      Radiology Studies: Mr Lumbar Spine Wo Contrast  Addendum Date: 02/01/2018   ADDENDUM REPORT: 02/01/2018 07:24 ADDENDUM: Findings were communicated by telephone to the nurse practitioner taking care of the patient Jimmye Norman at 12:43 a.m. on 02/01/2017. Electronically Signed   By: Rise Mu M.D.   On:  02/01/2018 07:24   Result Date: 02/01/2018 CLINICAL DATA:  85 year old male with history of osteomyelitis discitis, status post thoracic fusion, now with new lower extremity weakness. EXAM: MRI THORACIC AND LUMBAR SPINE WITHOUT AND WITH CONTRAST TECHNIQUE: Multiplanar and multiecho pulse sequences of the thoracic and lumbar spine were obtained without and with intravenous contrast. CONTRAST:  10 cc of Gadavist. COMPARISON:  Comparison made with prior MRI from 01/10/2015. FINDINGS: MRI THORACIC SPINE FINDINGS Alignment: Limited sagittal imaging of the cervical spine grossly unremarkable other than for degenerative spondylolysis and facet arthrosis.  No significant stenosis. Stable thoracic alignment with mild straightening of the normal thoracic kyphosis. Trace chronic anterolisthesis of T12 on L1, stable from previous. Vertebrae: Similar bulky osteophytic spurring or with suspected ankylosis again seen throughout the thoracic spine. Abnormal fluid density within the T9-T10 interspace with adjacent endplate edema and enhancement, concerning for osteomyelitis discitis. There has been mildly progressive height loss at the anterior inferior T9 vertebral body, with progressive height loss at the superior endplate of T10 as compared to previous exam. Pocket of fluid and/or disc material seen eroding into the mid aspect of the T9 vertebral body (series 18, image 14). Findings compatible with acute osteomyelitis discitis. Edema and enhancement extends into the bilateral T9-10 facets, likely reflecting associated septic arthritis. There is additional abnormal fluid signal intensity with enhancement within the T8-9 interspace, consistent with osteomyelitis discitis as well. Vertebral body heights relatively maintained at this level without significant height loss. Changes compatible with chronic osteomyelitis seen about the T12-L1 level without evidence for significant active infection at this time. There has been interval  loss of disc height with partial ankylosis of the T12 and L1 vertebral body since previous exam. Fluid signal intensity without associated endplate edema or enhancement noted at the C7-T1 space, favored to be degenerative in nature (series 18, image 15). Underlying bone marrow signal intensity mildly heterogeneous but within normal limits. No worrisome osseous lesions. Cord: Abnormal cord signal abnormality seen within the thoracic spinal cord extending from T8-9 through T10-11, concerning for edema and/or myelomalacia due to compressive stenosis. Small focus of chronic myelomalacia noted at the T12-L1 level as well (series 17, image 10). Signal intensity within the thoracic spinal cord otherwise within normal limits. Paraspinal and other soft tissues: Paraspinous edema and phlegmon seen about the T8-9 and T9-10 interspaces. No discrete paraspinous or soft tissue abscess identified. Small layering bilateral pleural effusions noted. Dolichoectatic intrathoracic aorta with associated atherosclerotic change. Disc levels: Abnormal epidural enhancement seen extending from the T7-8 level through the T11-12 levels related to T8-9 and T9-10 osteomyelitis discitis (series 32, image 7). There is circumferential epidural phlegmon/abscess at the T8-9 and T9-10 levels with resultant up to severe spinal stenosis. This is most pronounced at the level of T9 where the thecal sac is compressed measuring 6 mm in transverse diameter (series 20, image 26). Associated edema and/or myelomalacia as above. Partial ankylosis at T12-L1 with trace anterolisthesis. Resultant circumferential disc osteophyte mildly flattens and indents the ventral thecal sac. Moderate facet hypertrophy at this level with resultant mild spinal stenosis. Patient status post prior posterior decompression at this level. No other significant stenosis within the thoracic spine. MRI LUMBAR SPINE FINDINGS Segmentation: Transitional lumbosacral anatomy with partial  lumbarization of the S1 segment. Same numbering system employed as on previous exams. Alignment: Straightening with reversal of the normal lordosis at the thoracolumbar junction, stable. Trace anterolisthesis of T12 on L1, stable and chronic. Vertebrae: Bulky endplate osteophytic spurring with ankylosis again seen throughout the lumbar spine with multilevel ankylosis, most notable at L2 through L4. Prior posterior decompression at the T12-L1 level. Bone marrow signal intensity heterogeneous without worrisome osseous lesions. Changes compatible with chronic/burned-out osteomyelitis at the T12-L1 level with ankylosis. Small amount of fluid signal seen within the anterolateral aspect of the L4-5 disc space without associated endplate edema. Fluid signal also seen within the L5-S1 disc space with small amount of reactive endplate edema, most notable on the left (series 28, image 11). Abnormal marrow edema present about the left L5-S1 facet (series 28, image 11).  Conus medullaris: Extends to the L1-2. Small focus of chronic myelomalacia at the level of T12-L1 (series 27, image 7). Conus medullaris otherwise within normal limits. Paraspinal and other soft tissues: Chronic fatty atrophy noted within the posterior paraspinous soft tissues as well as the right psoas muscle. No paraspinous collection. Chronic bilateral renal atrophy partially visualized. Atherosclerotic change noted within the intra-abdominal aorta. Disc levels: L1-2: Seen only on sagittal projection. Diffuse disc bulge with disc desiccation and intervertebral disc space narrowing. Bilateral facet hypertrophy. Mild canal narrowing without compressive stenosis. Foramina remain patent. L2-3: Ankylosis of the L2 and L3 disc space. Posterior osteophytic ridging flattens and indents the ventral thecal sac, greater on the right. Mild facet ligament flavum hypertrophy. Resultant mild canal with right lateral recess stenosis. Foramina remain patent. L3-4: Chronic  ankylosis with loss of the L3-4 disc space. Posterior osseous ridging mildly flattens the ventral thecal sac. Moderate bilateral facet hypertrophy. No significant canal stenosis. Mild bilateral foraminal narrowing. L4-5: Chronic intervertebral disc space narrowing with partial ankylosis of the L4 and L5 vertebral bodies. Moderate facet hypertrophy. Prior posterior decompression. Residual mild canal with bilateral lateral recess narrowing. Mild left with mild to moderate right L4 foraminal stenosis. L5-S1: Chronic intervertebral disc space narrowing with diffuse disc bulge and disc desiccation. Reactive endplate osteophytic spurring. Severe left with mild right facet hypertrophy. Ligamentum flavum thickening. Prior posterior decompression. Persistent severe canal with bilateral lateral recess stenosis. Moderate right with severe left L5 foraminal narrowing. IMPRESSION: 1. Findings consistent with osteomyelitis discitis at T8-9 and T9-10. Associated epidural phlegmon/abscess extending from T7-8 through T11-12, with severe spinal stenosis at the level of T9-10. Associated cord signal abnormality at the level of T9-10 compatible with edema and/or myelomalacia. Neuro surgical consultation recommended. 2. Probable associated facet arthritis at T9-10. 3. Possible mild/early discitis at L4-5 and L5-S1 without appreciable epidural collection or other abnormality. Edema about the left L5-S1 facet favored to be related to facet arthritis. 4. Sequelae of chronic discitis osteomyelitis at T12-L1 without evidence for significant or residual or recurrent infection. Small focus of chronic myelomalacia within the distal cord at this level. 5. Multifactorial degenerative changes at L5-S1 with resultant severe canal, with moderate to severe left greater than right foraminal stenosis. 6. Fluid signal intensity within the C7-T1 disc without appreciable enhancement or endplate changes, favored to be degenerative. Attention at follow-up  recommended. 7. Small layering bilateral pleural effusions. Current attempt being made to contact the ordering and/or covering physician. Results will be communicated as soon as possible. Electronically Signed: By: Rise Mu M.D. On: 02/01/2018 00:33   Mr Thoracic Spine W Wo Contrast  Addendum Date: 02/01/2018   ADDENDUM REPORT: 02/01/2018 07:24 ADDENDUM: Findings were communicated by telephone to the nurse practitioner taking care of the patient Jimmye Norman at 12:43 a.m. on 02/01/2017. Electronically Signed   By: Rise Mu M.D.   On: 02/01/2018 07:24   Result Date: 02/01/2018 CLINICAL DATA:  85 year old male with history of osteomyelitis discitis, status post thoracic fusion, now with new lower extremity weakness. EXAM: MRI THORACIC AND LUMBAR SPINE WITHOUT AND WITH CONTRAST TECHNIQUE: Multiplanar and multiecho pulse sequences of the thoracic and lumbar spine were obtained without and with intravenous contrast. CONTRAST:  10 cc of Gadavist. COMPARISON:  Comparison made with prior MRI from 01/10/2015. FINDINGS: MRI THORACIC SPINE FINDINGS Alignment: Limited sagittal imaging of the cervical spine grossly unremarkable other than for degenerative spondylolysis and facet arthrosis. No significant stenosis. Stable thoracic alignment with mild straightening of the normal thoracic kyphosis.  Trace chronic anterolisthesis of T12 on L1, stable from previous. Vertebrae: Similar bulky osteophytic spurring or with suspected ankylosis again seen throughout the thoracic spine. Abnormal fluid density within the T9-T10 interspace with adjacent endplate edema and enhancement, concerning for osteomyelitis discitis. There has been mildly progressive height loss at the anterior inferior T9 vertebral body, with progressive height loss at the superior endplate of T10 as compared to previous exam. Pocket of fluid and/or disc material seen eroding into the mid aspect of the T9 vertebral body (series 18,  image 14). Findings compatible with acute osteomyelitis discitis. Edema and enhancement extends into the bilateral T9-10 facets, likely reflecting associated septic arthritis. There is additional abnormal fluid signal intensity with enhancement within the T8-9 interspace, consistent with osteomyelitis discitis as well. Vertebral body heights relatively maintained at this level without significant height loss. Changes compatible with chronic osteomyelitis seen about the T12-L1 level without evidence for significant active infection at this time. There has been interval loss of disc height with partial ankylosis of the T12 and L1 vertebral body since previous exam. Fluid signal intensity without associated endplate edema or enhancement noted at the C7-T1 space, favored to be degenerative in nature (series 18, image 15). Underlying bone marrow signal intensity mildly heterogeneous but within normal limits. No worrisome osseous lesions. Cord: Abnormal cord signal abnormality seen within the thoracic spinal cord extending from T8-9 through T10-11, concerning for edema and/or myelomalacia due to compressive stenosis. Small focus of chronic myelomalacia noted at the T12-L1 level as well (series 17, image 10). Signal intensity within the thoracic spinal cord otherwise within normal limits. Paraspinal and other soft tissues: Paraspinous edema and phlegmon seen about the T8-9 and T9-10 interspaces. No discrete paraspinous or soft tissue abscess identified. Small layering bilateral pleural effusions noted. Dolichoectatic intrathoracic aorta with associated atherosclerotic change. Disc levels: Abnormal epidural enhancement seen extending from the T7-8 level through the T11-12 levels related to T8-9 and T9-10 osteomyelitis discitis (series 32, image 7). There is circumferential epidural phlegmon/abscess at the T8-9 and T9-10 levels with resultant up to severe spinal stenosis. This is most pronounced at the level of T9 where the  thecal sac is compressed measuring 6 mm in transverse diameter (series 20, image 26). Associated edema and/or myelomalacia as above. Partial ankylosis at T12-L1 with trace anterolisthesis. Resultant circumferential disc osteophyte mildly flattens and indents the ventral thecal sac. Moderate facet hypertrophy at this level with resultant mild spinal stenosis. Patient status post prior posterior decompression at this level. No other significant stenosis within the thoracic spine. MRI LUMBAR SPINE FINDINGS Segmentation: Transitional lumbosacral anatomy with partial lumbarization of the S1 segment. Same numbering system employed as on previous exams. Alignment: Straightening with reversal of the normal lordosis at the thoracolumbar junction, stable. Trace anterolisthesis of T12 on L1, stable and chronic. Vertebrae: Bulky endplate osteophytic spurring with ankylosis again seen throughout the lumbar spine with multilevel ankylosis, most notable at L2 through L4. Prior posterior decompression at the T12-L1 level. Bone marrow signal intensity heterogeneous without worrisome osseous lesions. Changes compatible with chronic/burned-out osteomyelitis at the T12-L1 level with ankylosis. Small amount of fluid signal seen within the anterolateral aspect of the L4-5 disc space without associated endplate edema. Fluid signal also seen within the L5-S1 disc space with small amount of reactive endplate edema, most notable on the left (series 28, image 11). Abnormal marrow edema present about the left L5-S1 facet (series 28, image 11). Conus medullaris: Extends to the L1-2. Small focus of chronic myelomalacia at the level  of T12-L1 (series 27, image 7). Conus medullaris otherwise within normal limits. Paraspinal and other soft tissues: Chronic fatty atrophy noted within the posterior paraspinous soft tissues as well as the right psoas muscle. No paraspinous collection. Chronic bilateral renal atrophy partially visualized.  Atherosclerotic change noted within the intra-abdominal aorta. Disc levels: L1-2: Seen only on sagittal projection. Diffuse disc bulge with disc desiccation and intervertebral disc space narrowing. Bilateral facet hypertrophy. Mild canal narrowing without compressive stenosis. Foramina remain patent. L2-3: Ankylosis of the L2 and L3 disc space. Posterior osteophytic ridging flattens and indents the ventral thecal sac, greater on the right. Mild facet ligament flavum hypertrophy. Resultant mild canal with right lateral recess stenosis. Foramina remain patent. L3-4: Chronic ankylosis with loss of the L3-4 disc space. Posterior osseous ridging mildly flattens the ventral thecal sac. Moderate bilateral facet hypertrophy. No significant canal stenosis. Mild bilateral foraminal narrowing. L4-5: Chronic intervertebral disc space narrowing with partial ankylosis of the L4 and L5 vertebral bodies. Moderate facet hypertrophy. Prior posterior decompression. Residual mild canal with bilateral lateral recess narrowing. Mild left with mild to moderate right L4 foraminal stenosis. L5-S1: Chronic intervertebral disc space narrowing with diffuse disc bulge and disc desiccation. Reactive endplate osteophytic spurring. Severe left with mild right facet hypertrophy. Ligamentum flavum thickening. Prior posterior decompression. Persistent severe canal with bilateral lateral recess stenosis. Moderate right with severe left L5 foraminal narrowing. IMPRESSION: 1. Findings consistent with osteomyelitis discitis at T8-9 and T9-10. Associated epidural phlegmon/abscess extending from T7-8 through T11-12, with severe spinal stenosis at the level of T9-10. Associated cord signal abnormality at the level of T9-10 compatible with edema and/or myelomalacia. Neuro surgical consultation recommended. 2. Probable associated facet arthritis at T9-10. 3. Possible mild/early discitis at L4-5 and L5-S1 without appreciable epidural collection or other  abnormality. Edema about the left L5-S1 facet favored to be related to facet arthritis. 4. Sequelae of chronic discitis osteomyelitis at T12-L1 without evidence for significant or residual or recurrent infection. Small focus of chronic myelomalacia within the distal cord at this level. 5. Multifactorial degenerative changes at L5-S1 with resultant severe canal, with moderate to severe left greater than right foraminal stenosis. 6. Fluid signal intensity within the C7-T1 disc without appreciable enhancement or endplate changes, favored to be degenerative. Attention at follow-up recommended. 7. Small layering bilateral pleural effusions. Current attempt being made to contact the ordering and/or covering physician. Results will be communicated as soon as possible. Electronically Signed: By: Rise Mu M.D. On: 02/01/2018 00:33    Scheduled Meds: . divalproex  125 mg Oral QHS  . docusate sodium  100 mg Oral BID  . escitalopram  15 mg Oral Daily  . ferrous sulfate  325 mg Oral Q breakfast  . finasteride  5 mg Oral QHS  . insulin aspart  0-15 Units Subcutaneous TID WC  . insulin aspart  0-5 Units Subcutaneous QHS  . lactulose  10 g Oral QHS  . lactulose  30 g Oral Daily  . levothyroxine  88 mcg Oral QAC breakfast  . lidocaine  1 patch Transdermal Q24H  . pioglitazone  15 mg Oral Daily  . pravastatin  40 mg Oral q1800  . rOPINIRole  0.5 mg Oral QHS  . sodium chloride flush  3 mL Intravenous Q12H   Continuous Infusions: . sodium chloride 75 mL/hr at 02/02/18 0007     LOS: 3 days   Time spent: 35 minutes.  Tyrone Nine, MD [facility] [email or web address] Password [facility] 02/02/2018, 4:48 PM

## 2018-02-02 NOTE — Clinical Social Work Note (Signed)
[name] has received [employer] auth. MD notified.  [place]cone[place]ridget Tel [place], [place] [phone]

## 2018-02-02 NOTE — Progress Notes (Signed)
OT Cancellation Note  Patient Details Name: [name] MRN: [id number] DOB: 1930-08-16   Cancelled Treatment:    Reason Eval/Treat Not Completed: Patient at procedure or test/ unavailable;Other (comment)Out of room for biopsy. Plan to reattempt later today or tomorrow as medically appropriate and schedule permits.   Raynald Kemp, OT Acute Rehabilitation Services Pager: [phone] Office: [phone]  02/02/2018, 9:12 AM

## 2018-02-02 NOTE — Progress Notes (Addendum)
Image-guided T8/9 and/or T9/10 disc aspirations under general anesthesia tentatively for 02/04/2018 at 1000 with Dr. Corliss Skains. Patient to be NPO on 02/04/2018 at 0001. RN aware.  Please call IR with questions/concerns.  Waylan Boga Noel Henandez, PA-C 02/02/2018, 4:02 PM

## 2018-02-02 NOTE — Progress Notes (Signed)
CBG 69.  Implemented hypoglycemia protocol.  Pt asymptomatic.  New diet orders received (pt has been NPO).  Preparing meal now.  Provider on call notified.    Will continue to monitor.

## 2018-02-02 NOTE — Care Management Note (Signed)
Case Management Note  Patient Details  Name: [name] MRN: [id number] Date of Birth: 03/24/1930  Subjective/Objective:                 + osteo, WC bound, from [facility] [employer]   Action/Plan:  Patient from [facility], CSW aware and following for return at DC when medically ready.   Expected Discharge Date:                  Expected Discharge Plan:  Skilled Nursing Facility  In-House Referral:  Clinical Social Work  Discharge planning Services  CM Consult  Post Acute Care Choice:    Choice offered to:     DME Arranged:    DME Agency:     HH Arranged:    HH Agency:     Status of Service:  Completed, signed off  If discussed at [employer], dates discussed:    Additional Comments:  Lawerance Sabal, RN 02/02/2018, 12:14 PM

## 2018-02-02 NOTE — Consult Note (Signed)
INR.  Procedure abandoned as patient unable to lay still  whilst prone because of  severe  muscle spasms associated with PaO2 desat.  D/W patients daughter. S.Fin Hupp MD

## 2018-02-03 ENCOUNTER — Other Ambulatory Visit: Payer: Self-pay | Admitting: Radiology

## 2018-02-03 DIAGNOSIS — M4624 Osteomyelitis of vertebra, thoracic region: Secondary | ICD-10-CM

## 2018-02-03 DIAGNOSIS — Z886 Allergy status to analgesic agent status: Secondary | ICD-10-CM

## 2018-02-03 DIAGNOSIS — Z885 Allergy status to narcotic agent status: Secondary | ICD-10-CM

## 2018-02-03 DIAGNOSIS — Z888 Allergy status to other drugs, medicaments and biological substances status: Secondary | ICD-10-CM

## 2018-02-03 LAB — CBC
HCT: 34.6 % — ABNORMAL LOW (ref 39.0–52.0)
Hemoglobin: 10.6 g/dL — ABNORMAL LOW (ref 13.0–17.0)
MCH: 29.7 pg (ref 26.0–34.0)
MCHC: 30.6 g/dL (ref 30.0–36.0)
MCV: 96.9 fL (ref 80.0–100.0)
Platelets: 142 10*3/uL — ABNORMAL LOW (ref 150–400)
RBC: 3.57 MIL/uL — ABNORMAL LOW (ref 4.22–5.81)
RDW: 14.7 % (ref 11.5–15.5)
WBC: 7 10*3/uL (ref 4.0–10.5)
nRBC: 0 % (ref 0.0–0.2)

## 2018-02-03 LAB — BASIC METABOLIC PANEL
Anion gap: 5 (ref 5–15)
BUN: 13 mg/dL (ref 8–23)
CO2: 25 mmol/L (ref 22–32)
Calcium: 8.7 mg/dL — ABNORMAL LOW (ref 8.9–10.3)
Chloride: 110 mmol/L (ref 98–111)
Creatinine, Ser: 1.04 mg/dL (ref 0.61–1.24)
GFR calc Af Amer: >60 mL/min (ref >60)
GFR calc non Af Amer: >60 mL/min (ref >60)
Glucose, Bld: 98 mg/dL (ref 70–99)
Potassium: 4.5 mmol/L (ref 3.5–5.1)
Sodium: 140 mmol/L (ref 135–145)

## 2018-02-03 LAB — GLUCOSE, CAPILLARY
Glucose-Capillary: 109 mg/dL — ABNORMAL HIGH (ref 70–99)
Glucose-Capillary: 80 mg/dL (ref 70–99)
Glucose-Capillary: 101 mg/dL — ABNORMAL HIGH (ref 70–99)

## 2018-02-03 LAB — URINE CULTURE: Culture: >=100000 — AB

## 2018-02-03 MED ORDER — ALPRAZOLAM 0.25 MG PO TABS
0.2500 mg | ORAL_TABLET | Freq: Two times a day (BID) | ORAL | Status: DC | PRN
  Administered 2018-02-03: 0.25 mg via ORAL
  Filled 2018-02-03: qty 1

## 2018-02-03 NOTE — Progress Notes (Signed)
Inpatient Diabetes Program Recommendations  AACE/ADA: New Consensus Statement on Inpatient Glycemic Control (2015)  Target Ranges:  Prepandial:   less than 140 mg/dL      Peak postprandial:   less than 180 mg/dL (1-2 hours)      Critically ill patients:  140 - 180 mg/dL   Lab Results  Component Value Date   GLUCAP 83 02/02/2018   HGBA1C 5.6 11/03/2017    Review of Glycemic Control Results for [name], [name] (MRN [id number]) as of 02/03/2018 10:57  Ref. Range 02/02/2018 07:48 02/02/2018 12:14 02/02/2018 17:57 02/02/2018 20:38 02/02/2018 21:38  Glucose-Capillary Latest Ref Range: 70 - 99 mg/dL 83 884 (H) 81 69 (L) 83   Diabetes history: DM 2 Outpatient Diabetes medications: Actos 15 mg daily Current orders for Inpatient glycemic control:  Novolog moderate tid with meals and HS  Inpatient Diabetes Program Recommendations:   Blood sugars less than 100 mg/dL.  Please consider d/c of Novolog correction.   Thanks,  Beryl Meager, RN, BC-ADM Inpatient Diabetes Coordinator Pager [phone] (8a-5p)

## 2018-02-03 NOTE — Progress Notes (Addendum)
PROGRESS NOTE  [name]  XTG:[id number] DOB: [birth year] DOA: 01/30/2018 PCP: Malka So., MD   Brief Narrative: [name] is an 85 y.o. 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  Patient has apparently been having these episodes of confusion on and off for a while.  Today however he had syncopal episode.  When EMS arrived blood pressure was 72/40.  He received up to 700 cc bolus of normal saline and brought to the ER where he remained hypotensive with systolic in the 80s.  He received couple of liters of IV fluid and systolic is up to 120 at the moment.  Patient has reportedly been nauseated in the last couple of days has not been eating or drinking adequately.  No diarrhea no vomiting.  He has some chest discomfort with this.  But throughout the week his confusion has progressively worsened.  Patient is therefore being diagnosed with syncopal episode hypotension and possible arrhythmia.  He is still in atrial fibrillation but the rate is controlled and he is fully anticoagulated..  ED Course: Initial blood pressure was 85/61 in the ER with a temperature 97.5 pulse 107 respiratory 21 oxygen sat 79% on room air he is currently 100% on 2 L.  White count is 4.0 hemoglobin 7.9 and platelets are clumped.  He has sodium 141 potassium 3.0 chloride 118 and CO2 20 BUN 20 creatinine 1.1 and calcium 6.5 with troponin 0 0.03.  Lactic acid was 1.22 fecal occult blood was negative x2.  Head CT without contrast is negative and chest x-ray showed no significant findings.  Hospital Course: MRI brain showed no acute stroke. Blood pressure has improved with fluids. No perfusion-limiting arrhythmias on telemetry. Due to history including subacute onset of  left lower extremity paresis in addition to RLE as well as bowel and bladder incontinence, MRI of the thoracic and lumbar spine was performed 01/31/2018 which demonstrated osteomyelitis/discitis at T8-9 and T9-10 with associated epidural phlegmon/abscess extending from T7-8 through T11-12, with severe spinal stenosis at the level of T9-10. Associated cord signal abnormality at the level of T9-10 compatible with edema and/or myelomalacia. Also noted possible mild/early discitis at L4-5 and L5-S1 without appreciable epidural collection or other abnormality. Sequelae of chronic discitis osteomyelitis at T12-L1 without evidence for significant or residual or recurrent infection. Small focus of chronic myelomalacia within the distal cord at this level.  Neurosurgery was consulted, felt that the patient's potential benefit from surgery would be marginal with months of symptoms and preexisting debility which would limit rehabilitation efforts, and risks are prohibitive. IR has been consulted for biopsy/aspiration for culture to guide antimicrobial therapy.  Assessment & Plan: Principal Problem:   Epidural abscess Active Problems:   Atrial fibrillation (HCC)   Hypothyroidism   Osteomyelitis of thoracic spine (HCC)   Type 2 diabetes mellitus with complication (HCC)   Other chronic pain   Alzheimer's type dementia (HCC)   Hypotension   Syncope  Hypotension: Possible orthostatic/hypotensive syncope. While the patient has an infection, there are no other indications of SIRS to suggest that hypotension was attributable solely to infection. Pt at very high risk for arrhythmia however. Has improved with hydration. Cortisol sufficient. Echocardiogram without low CO.  - Monitor.  Osteomyelitis/discitis at T8-9 and T9-10 with associated epidural phlegmon/abscess extending from T7-8  through T11-12. Also noted possible mild/early discitis at L4-5 and L5-S1 without appreciable epidural collection or other  abnormality. - Interventional radiology with unsuccessful attempt at aspiration 1/8, aborted procedure prior to attempt due to back spasms and dyspnea. Discussed personally with Dr. Corliss Skains and Dr. Orvan Falconer, ID, this morning. Benefit of definitive diagnosis to guide future management outweighs risk, so will proceed with general anesthesia on 1/10 for aspiration. - Appreciate ID input.  Severe spinal stenosis at the level of T9-10 with associated cord edema and/or myelomalacia. Also with myelomalacia distally associated with prior osteo/discitis.  - Dr. Maurice Small, neurosurgery, evaluated the patient 1/7 recommending against surgery.  - Continue PT/OT. Pt and family relieved by certainty of diagnosis despite its minimal chance for recovery. - Associated spasms to be treated with tizanidine, ropinirole, iron supplementation in the event RLS is contributing. Has unknown reaction listed as allergy to gabapentin. Seems to be more comfortable today after starting lyrica. Will continue this and monitor for sedation/AMS.  History of discitis, spinal (thoracolumbar) osteomyelitis and recurrent MSSA infection including right hip: MRI demonstrated sequelae of chronic discitis osteomyelitis at T12-L1 without evidence for significant or residual or recurrent infection of the hip.  - Tx as above.  Nitrite-positive pyuria: Unclear significance, though ?if lower abdominal spasms constitute symptoms of UTI.  - Urine culture growing >100k Proteus. Recommendation to monitor off abx per ID.    Anemia of chronic disease and iron deficiency anemia: Reportedly has needed transfusions in the past for this, no bleeding, FOBT negative x2. Initial hemoglobin possibly spuriously low at 7.9, up to 10.8 and stable since. - Monitor and transfuse prn symptoms or hgb 7g/dl - Continue iron supplementation  CKD stage III: CrCl remains >64ml/min currently, so may actually be stage II. Will continue monitoring.  - Taking PO well,  so not on IVF's currently - Avoid nephrotoxins as able - Monitor BMP intermittently  T2DM with hypoglycemia: - DC SSI, pioglitazone   Hypothyroidism: TSH 1.613.  - Continue synthroid  Atrial fibrillation: Rate controlled.  - Xarelto to resume per IR. - Continue telemetry  Hypokalemia: Resolved with replacement. - Monitor intermittently  Alzheimer's dementia:  - Continue home regimen, needs supervisory care. Mental status generally best during the mornings. - Not on medications at home.   HLD:  - Continue statin  Morbid obesity: BMI 35 - Weight loss recommended  DVT prophylaxis: SCDs  Code Status: Full Family Communication: None at bedside Disposition Plan: SNF once work up and treatment complete. Anticipated DC date 1/13.  Consultants:   IR  ID  Procedures:   Echocardiogram 02/01/2018: - Left ventricle: The cavity size was normal. There was severe   focal basal hypertrophy. Systolic function was normal. The   estimated ejection fraction was in the range of 55% to 60%. Wall   motion was normal; there were no regional wall motion   abnormalities. The study was not technically sufficient to allow   evaluation of LV diastolic dysfunction due to atrial   fibrillation. - Aortic valve: Trileaflet; normal thickness, mildly calcified   leaflets. There was trivial regurgitation. - Aorta: Aortic root dimension: 40 mm (ED). Ascending aortic   diameter: 38 mm (S). - Aortic root: The aortic root was mildly dilated. - Ascending aorta: The ascending aorta was mildly dilated. - Left atrium: The atrium was mildly dilated. - Pericardium, extracardiac: A trivial, free-flowing pericardial   effusion was identified circumferential to the heart. The fluid   had no internal echoes.  Antimicrobials:  None  Subjective: Some lower blood sugars, noted no need for insulin since arrival. Eating ok, had no symptoms of hypoglycemia. No fevers, chills. Does not have pain at time of  interview, says spasms are severe and intermittent but may be less frequent today.    Objective: Vitals:   02/02/18 0748 02/02/18 1710 02/02/18 2301 02/03/18 0742  BP: (!) 148/81 (!) 146/95 129/84 (!) 165/88  Pulse: 71 65 77 82  Resp: 18 15 18 18   Temp: 98.4 F (36.9 C) 98.4 F (36.9 C) 98.8 F (37.1 C) 98.4 F (36.9 C)  TempSrc: Oral Oral Oral Oral  SpO2: 96% 98% 97% 96%  Weight:      Height:        Intake/Output Summary (Last 24 hours) at 02/03/2018 1229 Last data filed at 02/03/2018 1015 Gross per 24 hour  Intake 579.34 ml  Output 725 ml  Net -145.66 ml   Filed Weights   01/31/18 0500 02/01/18 0500 02/02/18 0346  Weight: 115.2 kg 114.3 kg 114.3 kg   Gen: Elderly male resting in no distress Pulm: Nonlabored breathing room air. Clear. CV: Irreg. No murmur, rub, or gallop. No JVD, trace dependent edema. GI: Abdomen soft, non-tender, non-distended, with normoactive bowel sounds.  Ext: Warm, no deformities Skin: No rashes, lesions or ulcers on visualized skin. Neuro: Sleeping but alert when awakened, conversant, appropriate. LE flaccid paraplegia. Psych: Judgement and insight appear fair. Mood euthymic & affect congruent. Behavior is appropriate.  Data Reviewed: I have personally reviewed following labs and imaging studies  CBC: Recent Labs  Lab 01/30/18 1620 01/31/18 1143 02/01/18 0626 02/02/18 0256 02/03/18 0318  WBC 4.0 7.5 6.1 6.5 7.0  NEUTROABS 2.5  --   --   --   --   HGB 7.9* 10.8* 10.5* 10.1* 10.6*  HCT 26.4* 35.0* 33.8* 32.9* 34.6*  MCV 100.4* 97.0 97.4 95.6 96.9  PLT PLATELET CLUMPS NOTED ON SMEAR, COUNT APPEARS DECREASED 157 147* 144* 142*   Basic Metabolic Panel: Recent Labs  Lab 01/30/18 1620 01/31/18 1000 02/01/18 0626 02/02/18 0256 02/03/18 0318  NA 141 140 141 139 140  K 3.1* 4.1 4.4 4.4 4.5  CL 118* 111 111 111 110  CO2 20* 22 24 24 25   GLUCOSE 93 91 129* 99 98  BUN 20 18 16 15 13   CREATININE 1.11 1.14 1.07 1.02 1.04  CALCIUM 6.5* 8.4*  8.5* 8.6* 8.7*   GFR: Estimated Creatinine Clearance: 66.8 mL/min (by C-G formula based on SCr of 1.04 mg/dL). Liver Function Tests: Recent Labs  Lab 01/30/18 1620 01/31/18 1000  AST 11* 15  ALT 7 9  ALKPHOS 50 63  BILITOT 0.4 0.6  PROT 4.2* 5.8*  ALBUMIN 2.1* 2.9*   No results for input(s): LIPASE, AMYLASE in the last 168 hours. No results for input(s): AMMONIA in the last 168 hours. Coagulation Profile: Recent Labs  Lab 02/02/18 0256  INR 1.30   Cardiac Enzymes: Recent Labs  Lab 01/30/18 2045 01/31/18 0249 01/31/18 1000  TROPONINI 0.03* 0.03* 0.04*   BNP (last 3 results) No results for input(s): PROBNP in the last 8760 hours. HbA1C: No results for input(s): HGBA1C in the last 72 hours. CBG: Recent Labs  Lab 02/02/18 1214 02/02/18 1757 02/02/18 2038 02/02/18 2138 02/03/18 1208  GLUCAP 109* 81 69* 83 109*   Lipid Profile: No results for input(s): CHOL, HDL, LDLCALC, TRIG, CHOLHDL, LDLDIRECT in the last 72 hours. Thyroid Function Tests: No results for input(s): TSH, T4TOTAL, FREET4, T3FREE, THYROIDAB in the last 72 hours.  Anemia Panel: No results for input(s): VITAMINB12, FOLATE, FERRITIN, TIBC, IRON, RETICCTPCT in the last 72 hours. Urine analysis:    Component Value Date/Time   COLORURINE YELLOW 01/31/2018 2048   APPEARANCEUR CLOUDY (A) 01/31/2018 2048   LABSPEC 1.015 01/31/2018 2048   PHURINE 5.0 01/31/2018 2048   GLUCOSEU NEGATIVE 01/31/2018 2048   HGBUR NEGATIVE 01/31/2018 2048   BILIRUBINUR NEGATIVE 01/31/2018 2048   KETONESUR NEGATIVE 01/31/2018 2048   PROTEINUR 30 (A) 01/31/2018 2048   UROBILINOGEN 0.2 12/30/2012 2015   NITRITE POSITIVE (A) 01/31/2018 2048   LEUKOCYTESUR LARGE (A) 01/31/2018 2048   Recent Results (from the past 240 hour(s))  Blood Culture (routine x 2)     Status: None (Preliminary result)   Collection Time: 01/30/18  4:27 PM  Result Value Ref Range Status   Specimen Description BLOOD LEFT ANTECUBITAL  Final   Special  Requests   Final    BOTTLES DRAWN AEROBIC AND ANAEROBIC Blood Culture adequate volume   Culture   Final    NO GROWTH 4 DAYS Performed at [facility], [address] [place], [place] [address]    Report Status PENDING  Incomplete  Blood Culture (routine x 2)     Status: None (Preliminary result)   Collection Time: 01/30/18  4:32 PM  Result Value Ref Range Status   Specimen Description BLOOD RIGHT ANTECUBITAL  Final   Special Requests   Final    BOTTLES DRAWN AEROBIC AND ANAEROBIC Blood Culture adequate volume   Culture   Final    NO GROWTH 4 DAYS Performed at [facility], [address], [place], [place] [address]    Report Status PENDING  Incomplete  MRSA PCR Screening     Status: None   Collection Time: 01/31/18 10:50 PM  Result Value Ref Range Status   MRSA by PCR NEGATIVE NEGATIVE Final    Comment:        The GeneXpert MRSA Assay (FDA approved for NASAL specimens only), is one component of a comprehensive MRSA colonization surveillance program. It is not intended to diagnose MRSA infection nor to guide or monitor treatment for MRSA infections. Performed at [facility], [address] [place], [place] [address]   Culture, Urine     Status: Abnormal   Collection Time: 02/01/18  2:28 PM  Result Value Ref Range Status   Specimen Description URINE, CATHETERIZED  Final   Special Requests   Final    NONE Performed at [facility], [address], [place], [place] [address]    Culture >=100,000 COLONIES/mL PROTEUS MIRABILIS (A)  Final   Report Status 02/03/2018 FINAL  Final   Organism ID, Bacteria PROTEUS MIRABILIS (A)  Final      Susceptibility   Proteus mirabilis - MIC*    AMPICILLIN <=2 SENSITIVE Sensitive     CEFAZOLIN <=4 SENSITIVE Sensitive     CEFTRIAXONE <=1 SENSITIVE Sensitive     CIPROFLOXACIN <=0.25 SENSITIVE Sensitive     GENTAMICIN <=1 SENSITIVE Sensitive     IMIPENEM 2 SENSITIVE Sensitive     NITROFURANTOIN 128 RESISTANT  Resistant     TRIMETH/SULFA <=20 SENSITIVE Sensitive     AMPICILLIN/SULBACTAM <=2 SENSITIVE Sensitive     PIP/TAZO <=4 SENSITIVE Sensitive     * >=100,000 COLONIES/mL PROTEUS MIRABILIS      Radiology Studies: No results found.  Scheduled Meds: . divalproex  125 mg Oral QHS  . docusate sodium  100 mg Oral BID  .  escitalopram  15 mg Oral Daily  . ferrous sulfate  325 mg Oral Q breakfast  . finasteride  5 mg Oral QHS  . insulin aspart  0-15 Units Subcutaneous TID WC  . insulin aspart  0-5 Units Subcutaneous QHS  . lactulose  10 g Oral QHS  . lactulose  30 g Oral Daily  . levothyroxine  88 mcg Oral QAC breakfast  . lidocaine  1 patch Transdermal Q24H  . pravastatin  40 mg Oral q1800  . pregabalin  25 mg Oral Daily  . rOPINIRole  0.5 mg Oral QHS  . sodium chloride flush  3 mL Intravenous Q12H   Continuous Infusions:    LOS: 4 days   Time spent: 25 minutes.  Tyrone Nine, MD [facility] [email or web address] Password [facility] 02/03/2018, 12:29 PM

## 2018-02-03 NOTE — Progress Notes (Signed)
Physical Therapy Treatment Patient Details Name: Joshua BrookeHarda [name] MRN: [id number] DOB: [birth year] Today's Date: 02/03/2018    History of Present Illness 85yo male brought to the ED due to acute confusion and syncope. BP found to be 72/40, SpO2 79% on room air. Head CT negative. Admitted for hypotension and syncope. PMH dementia, hx PPM removal secondary to bacteremia, A-fib, CKD, DM, HTN, thoracic pain, closed hip reduction, lumbar discectomy, total hip revision     PT Comments    Session focused on BUE therex and sitting balance. Pt max A of 2 people for all aspects of bed mobility at this time. Limited ROM and strength at shoulders overhead, pain free to 90' of abduction/flexion. Discussed importance of maintaining pain free ROM, pt very pleasant and agreeable.     Follow Up Recommendations  SNF     Equipment Recommendations  (TBD next venue)    Recommendations for Other Services       Precautions / Restrictions Precautions Precautions: Fall;Other (comment) Precaution Comments: bed bound and WC bound at baseline, no feeling and inconsistent motor in B LEs, severe UE weakness  Restrictions Weight Bearing Restrictions: No    Mobility  Bed Mobility Overal bed mobility: Needs Assistance Bed Mobility: Rolling Rolling: Max assist         General bed mobility comments: Max A for rolling, focused on sitting balance EOB, pt with ability to use BUE support but very weak and requires mod to max A for stability.   Transfers                 General transfer comment: will require +2 hoyer/maxi transfer  Ambulation/Gait                 Stairs             Wheelchair Mobility    Modified Rankin (Stroke Patients Only)       Balance Overall balance assessment: Needs assistance   Sitting balance-Leahy Scale: Poor                                      Cognition Arousal/Alertness: Awake/alert Behavior During Therapy: WFL for tasks  assessed/performed Overall Cognitive Status: Within Functional Limits for tasks assessed                                        Exercises General Exercises - Upper Extremity Shoulder Flexion: 10 reps Shoulder ABduction: 10 reps Shoulder ADduction: 10 reps Shoulder Horizontal ABduction: 10 reps Shoulder Horizontal ADduction: 10 reps Elbow Extension: 10 reps Wrist Flexion: 10 reps    General Comments        Pertinent Vitals/Pain Pain Assessment: No/denies pain    Home Living                      Prior Function            PT Goals (current goals can now be found in the care plan section) Acute Rehab PT Goals Patient Stated [facility] there  PT Goal Formulation: With patient/family Time For Goal Achievement: 02/14/18 Potential to Achieve Goals: Good Progress towards PT goals: Progressing toward goals    Frequency    Min 2X/week      PT Plan Current plan remains appropriate  Co-evaluation              AM-PAC PT "6 Clicks" Mobility   Outcome Measure  Help needed turning from your back to your side while in a flat bed without using bedrails?: A Lot Help needed moving from lying on your back to sitting on the side of a flat bed without using bedrails?: Total Help needed moving to and from a bed to a chair (including a wheelchair)?: Total Help needed standing up from a chair using your arms (e.g., wheelchair or bedside chair)?: Total Help needed to walk in hospital room?: Total Help needed climbing 3-5 steps with a railing? : Total 6 Click Score: 7    End of Session   Activity Tolerance: Patient tolerated treatment well Patient left: in bed;with call bell/phone within reach;with family/visitor present Nurse Communication: Mobility status PT Visit Diagnosis: Muscle weakness (generalized) (M62.81);Other abnormalities of gait and mobility (R26.89);Other symptoms and signs involving the nervous  system (R29.898)     Time: 0802-2336 PT Time Calculation (min) (ACUTE ONLY): 25 min  Charges:  $Therapeutic Exercise: 8-22 mins $Therapeutic Activity: 8-22 mins                     Etta Grandchild, PT, DPT Acute Rehabilitation Services Pager: [phone] Office: [phone]     Etta Grandchild 02/03/2018, 9:49 AM

## 2018-02-03 NOTE — Progress Notes (Signed)
[facility]  Date of Admission:  01/30/2018     Total days of antibiotics 0         ASSESSMENT/PLAN  [name] has throacic/lumbar discitis/osteomyelitis with epidural abscess/phlegmon. He is not currently on antimicrobial therapy as we await IR aspiration for culture. Following procedure would recommend starting vancomcyin and ceftriaxone pending culture results if able to be obtained. Clinically he remains stable with no fevers or leukocytosis.  1. Continue to monitor off antibiotics. 2. Await IR aspiration tomorrow.   Principal Problem:   Epidural abscess Active Problems:   Osteomyelitis of thoracic spine (HCC)   Atrial fibrillation (HCC)   Hypothyroidism   Type 2 diabetes mellitus with complication (HCC)   Other chronic pain   Alzheimer's type dementia (HCC)   Hypotension   Syncope   . divalproex  125 mg Oral QHS  . docusate sodium  100 mg Oral BID  . escitalopram  15 mg Oral Daily  . ferrous sulfate  325 mg Oral Q breakfast  . finasteride  5 mg Oral QHS  . insulin aspart  0-15 Units Subcutaneous TID WC  . insulin aspart  0-5 Units Subcutaneous QHS  . lactulose  10 g Oral QHS  . lactulose  30 g Oral Daily  . levothyroxine  88 mcg Oral QAC breakfast  . lidocaine  1 patch Transdermal Q24H  . pravastatin  40 mg Oral q1800  . pregabalin  25 mg Oral Daily  . rOPINIRole  0.5 mg Oral QHS  . sodium chloride flush  3 mL Intravenous Q12H    SUBJECTIVE:  Afebrile with no leukocytosis. No acute events overnight.   He is ready to have his procedure done tomorrow. Has some anxiety about pain following the procedure.   Allergies  Allergen Reactions  . Ace Inhibitors Cough  . Gabapentin Other (See Comments)    Unknown reaction  . Nsaids Other (See Comments)    Renal insufficiency   . Zolpidem Other (See Comments)    hallucinations Nightmares, irritable, nervous Disorientation     Review of Systems: Review of Systems    Constitutional: Negative for chills, fever and weight loss.  Respiratory: Negative for cough, shortness of breath and wheezing.   Cardiovascular: Negative for chest pain and leg swelling.  Gastrointestinal: Negative for abdominal pain, constipation, diarrhea, nausea and vomiting.  Musculoskeletal: Positive for back pain.  Skin: Negative for rash.      OBJECTIVE: Vitals:   02/02/18 0748 02/02/18 1710 02/02/18 2301 02/03/18 0742  BP: (!) 148/81 (!) 146/95 129/84 (!) 165/88  Pulse: 71 65 77 82  Resp: 18 15 18 18   Temp: 98.4 F (36.9 C) 98.4 F (36.9 C) 98.8 F (37.1 C) 98.4 F (36.9 C)  TempSrc: Oral Oral Oral Oral  SpO2: 96% 98% 97% 96%  Weight:      Height:       Body mass index is 35.14 kg/m.  Physical Exam Constitutional:      General: He is not in acute distress.    Appearance: He is well-developed.  Cardiovascular:     Rate and Rhythm: Normal rate and regular rhythm.     Heart sounds: Normal heart sounds. No murmur.  Pulmonary:     Effort: Pulmonary effort is normal.     Breath sounds: Normal breath sounds.  Skin:    General: Skin is warm and dry.  Neurological:     Mental Status: He is alert.  Psychiatric:  Mood and Affect: Mood normal.     Lab Results Lab Results  Component Value Date   WBC 7.0 02/03/2018   HGB 10.6 (L) 02/03/2018   HCT 34.6 (L) 02/03/2018   MCV 96.9 02/03/2018   PLT 142 (L) 02/03/2018    Lab Results  Component Value Date   CREATININE 1.04 02/03/2018   BUN 13 02/03/2018   NA 140 02/03/2018   K 4.5 02/03/2018   CL 110 02/03/2018   CO2 25 02/03/2018    Lab Results  Component Value Date   ALT 9 01/31/2018   AST 15 01/31/2018   ALKPHOS 63 01/31/2018   BILITOT 0.6 01/31/2018     Microbiology: Recent Results (from the past 240 hour(s))  Blood Culture (routine x 2)     Status: None (Preliminary result)   Collection Time: 01/30/18  4:27 PM  Result Value Ref Range Status   Specimen Description BLOOD LEFT ANTECUBITAL   Final   Special Requests   Final    BOTTLES DRAWN AEROBIC AND ANAEROBIC Blood Culture adequate volume   Culture   Final    NO GROWTH 4 DAYS Performed at [facility], [address] [place], [place] [address]    Report Status PENDING  Incomplete  Blood Culture (routine x 2)     Status: None (Preliminary result)   Collection Time: 01/30/18  4:32 PM  Result Value Ref Range Status   Specimen Description BLOOD RIGHT ANTECUBITAL  Final   Special Requests   Final    BOTTLES DRAWN AEROBIC AND ANAEROBIC Blood Culture adequate volume   Culture   Final    NO GROWTH 4 DAYS Performed at [facility], [address] [place], [place] [address]    Report Status PENDING  Incomplete  MRSA PCR Screening     Status: None   Collection Time: 01/31/18 10:50 PM  Result Value Ref Range Status   MRSA by PCR NEGATIVE NEGATIVE Final    Comment:        The GeneXpert MRSA Assay (FDA approved for NASAL specimens only), is one component of a comprehensive MRSA colonization surveillance program. It is not intended to diagnose MRSA infection nor to guide or monitor treatment for MRSA infections. Performed at [facility], [address] [place], [place] [address]   Culture, Urine     Status: Abnormal   Collection Time: 02/01/18  2:28 PM  Result Value Ref Range Status   Specimen Description URINE, CATHETERIZED  Final   Special Requests   Final    NONE Performed at [facility], [address] [place], [place] [address]    Culture >=100,000 COLONIES/mL PROTEUS MIRABILIS (A)  Final   Report Status 02/03/2018 FINAL  Final   Organism ID, Bacteria PROTEUS MIRABILIS (A)  Final      Susceptibility   Proteus mirabilis - MIC*    AMPICILLIN <=2 SENSITIVE Sensitive     CEFAZOLIN <=4 SENSITIVE Sensitive     CEFTRIAXONE <=1 SENSITIVE Sensitive     CIPROFLOXACIN <=0.25 SENSITIVE Sensitive     GENTAMICIN <=1 SENSITIVE Sensitive     IMIPENEM 2 SENSITIVE Sensitive     NITROFURANTOIN  128 RESISTANT Resistant     TRIMETH/SULFA <=20 SENSITIVE Sensitive     AMPICILLIN/SULBACTAM <=2 SENSITIVE Sensitive     PIP/TAZO <=4 SENSITIVE Sensitive     * >=100,000 COLONIES/mL PROTEUS MIRABILIS     Marcos EkeGreg Derionna Salvador, NP [facility]  [phone] Pager  02/03/2018  9:47 AM

## 2018-02-04 ENCOUNTER — Inpatient Hospital Stay (HOSPITAL_COMMUNITY): Payer: Medicare Other | Admitting: Certified Registered Nurse Anesthetist

## 2018-02-04 ENCOUNTER — Inpatient Hospital Stay (HOSPITAL_COMMUNITY): Payer: Medicare Other

## 2018-02-04 ENCOUNTER — Encounter (HOSPITAL_COMMUNITY)
Admission: EM | Disposition: A | Discharge status: Skilled Nursing Facility | Payer: Self-pay | Source: Home / Self Care | Attending: Family Medicine

## 2018-02-04 ENCOUNTER — Encounter (HOSPITAL_COMMUNITY): Payer: Self-pay | Admitting: *Deleted

## 2018-02-04 HISTORY — PX: IR FLUORO GUIDED NEEDLE PLC ASPIRATION/INJECTION LOC: IMG2395

## 2018-02-04 HISTORY — PX: RADIOLOGY WITH ANESTHESIA: SHX6223

## 2018-02-04 LAB — GLUCOSE, CAPILLARY
Glucose-Capillary: 84 mg/dL (ref 70–99)
Glucose-Capillary: 75 mg/dL (ref 70–99)
Glucose-Capillary: 73 mg/dL (ref 70–99)
Glucose-Capillary: 69 mg/dL — ABNORMAL LOW (ref 70–99)
Glucose-Capillary: 114 mg/dL — ABNORMAL HIGH (ref 70–99)
Glucose-Capillary: 72 mg/dL (ref 70–99)
Glucose-Capillary: 109 mg/dL — ABNORMAL HIGH (ref 70–99)
Glucose-Capillary: 95 mg/dL (ref 70–99)

## 2018-02-04 LAB — CULTURE, BLOOD (ROUTINE X 2)
Culture: NO GROWTH
Culture: NO GROWTH
Special Requests: ADEQUATE
Special Requests: ADEQUATE

## 2018-02-04 SURGERY — MRI WITH ANESTHESIA
Anesthesia: General

## 2018-02-04 MED ORDER — PROPOFOL 10 MG/ML IV BOLUS
INTRAVENOUS | Status: DC | PRN
  Administered 2018-02-04: 70 mg via INTRAVENOUS

## 2018-02-04 MED ORDER — LIDOCAINE 2% (20 MG/ML) 5 ML SYRINGE
INTRAMUSCULAR | Status: DC | PRN
  Administered 2018-02-04: 100 mg via INTRAVENOUS

## 2018-02-04 MED ORDER — PROMETHAZINE HCL 25 MG/ML IJ SOLN
INTRAMUSCULAR | Status: AC
  Filled 2018-02-04: qty 1

## 2018-02-04 MED ORDER — LACTATED RINGERS IV SOLN
INTRAVENOUS | Status: DC | PRN
  Administered 2018-02-04: 14:00:00 via INTRAVENOUS

## 2018-02-04 MED ORDER — DEXTROSE 50 % IV SOLN
INTRAVENOUS | Status: AC
  Filled 2018-02-04: qty 50

## 2018-02-04 MED ORDER — PHENYLEPHRINE 40 MCG/ML (10ML) SYRINGE FOR IV PUSH (FOR BLOOD PRESSURE SUPPORT)
PREFILLED_SYRINGE | INTRAVENOUS | Status: DC | PRN
  Administered 2018-02-04: 120 ug via INTRAVENOUS

## 2018-02-04 MED ORDER — FENTANYL CITRATE (PF) 100 MCG/2ML IJ SOLN
INTRAMUSCULAR | Status: DC | PRN
  Administered 2018-02-04: 25 ug via INTRAVENOUS

## 2018-02-04 MED ORDER — ROCURONIUM BROMIDE 10 MG/ML (PF) SYRINGE
PREFILLED_SYRINGE | INTRAVENOUS | Status: DC | PRN
  Administered 2018-02-04: 50 mg via INTRAVENOUS

## 2018-02-04 MED ORDER — PROMETHAZINE HCL 25 MG/ML IJ SOLN
2.5000 mg | Freq: Once | INTRAMUSCULAR | Status: AC
  Administered 2018-02-04: 2.5 mg via INTRAVENOUS

## 2018-02-04 MED ORDER — MORPHINE SULFATE (PF) 2 MG/ML IV SOLN
2.0000 mg | INTRAVENOUS | Status: DC | PRN
  Administered 2018-02-06: 2 mg via INTRAVENOUS
  Filled 2018-02-04: qty 1

## 2018-02-04 MED ORDER — SUGAMMADEX SODIUM 200 MG/2ML IV SOLN
INTRAVENOUS | Status: DC | PRN
  Administered 2018-02-04: 228.6 mg via INTRAVENOUS

## 2018-02-04 MED ORDER — PREGABALIN 50 MG PO CAPS
50.0000 mg | ORAL_CAPSULE | Freq: Every day | ORAL | Status: DC
  Administered 2018-02-05 – 2018-02-06 (×2): 50 mg via ORAL
  Filled 2018-02-04 (×2): qty 1

## 2018-02-04 MED ORDER — SODIUM CHLORIDE 0.9 % IV SOLN
INTRAVENOUS | Status: DC | PRN
  Administered 2018-02-04: 50 ug/min via INTRAVENOUS
  Administered 2018-02-04: 40 ug/min via INTRAVENOUS

## 2018-02-04 MED ORDER — LACTATED RINGERS IV SOLN
INTRAVENOUS | Status: DC
  Administered 2018-02-04: 09:00:00 via INTRAVENOUS

## 2018-02-04 MED ORDER — ONDANSETRON HCL 4 MG/2ML IJ SOLN
INTRAMUSCULAR | Status: DC | PRN
  Administered 2018-02-04: 4 mg via INTRAVENOUS

## 2018-02-04 NOTE — Anesthesia Preprocedure Evaluation (Addendum)
Anesthesia Evaluation  Patient identified by MRN, date of birth, ID band Patient awake    Reviewed: Allergy & Precautions, NPO status , Patient's Chart, lab work & pertinent test results  Airway Mallampati: II  TM Distance: >3 FB Neck ROM: Full    Dental no notable dental hx. (+) Edentulous Upper, Dental Advisory Given   Pulmonary    Pulmonary exam normal breath sounds clear to auscultation       Cardiovascular hypertension, Normal cardiovascular exam+ dysrhythmias Atrial Fibrillation  Rhythm:Regular Rate:Normal     Neuro/Psych PSYCHIATRIC DISORDERS Anxiety Depression Dementia    GI/Hepatic   Endo/Other  diabetes, Well Controlled, Oral Hypoglycemic Agents  Renal/GU      Musculoskeletal  (+) Arthritis , Osteoarthritis,    Abdominal   Peds  Hematology   Anesthesia Other Findings 85 y.o. male with pmh atrial fibrillation status post previous pacemaker placement which is removed, recurrent MSSA infection, chronic kidney disease stage III, history of DVT, history of Alzheimer's dementia, history of depression hyperlipidemia diabetes and hypertension who presented 01/30/18 from assisted living with sudden onset of confusion and passing out found to have T7-T12 epidural abscess  Reproductive/Obstetrics                           Anesthesia Physical Anesthesia Plan  ASA: III  Anesthesia Plan: General   Post-op Pain Management:    Induction: Intravenous  PONV Risk Score and Plan: 2 and Treatment may vary due to age or medical condition, Dexamethasone and Ondansetron  Airway Management Planned: Oral ETT  Additional Equipment:   Intra-op Plan:   Post-operative Plan: Extubation in OR  Informed Consent: I have reviewed the patients History and Physical, chart, labs and discussed the procedure including the risks, benefits and alternatives for the proposed anesthesia with the patient or authorized  representative who has indicated his/her understanding and acceptance.   Dental advisory given  Plan Discussed with: CRNA  Anesthesia Plan Comments:         Anesthesia Quick Evaluation

## 2018-02-04 NOTE — Progress Notes (Signed)
PROGRESS NOTE  [name]  ZOX:[id number] DOB: 06/30/1930 DOA: 01/30/2018 PCP: Malka So., MD   Brief Narrative: [name] is an 85 y.o. 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  Patient has apparently been having these episodes of confusion on and off for a while.  Today however he had syncopal episode.  When EMS arrived blood pressure was 72/40.  He received up to 700 cc bolus of normal saline and brought to the ER where he remained hypotensive with systolic in the 80s.  He received couple of liters of IV fluid and systolic is up to 120 at the moment.  Patient has reportedly been nauseated in the last couple of days has not been eating or drinking adequately.  No diarrhea no vomiting.  He has some chest discomfort with this.  But throughout the week his confusion has progressively worsened.  Patient is therefore being diagnosed with syncopal episode hypotension and possible arrhythmia.  He is still in atrial fibrillation but the rate is controlled and he is fully anticoagulated.  ED Course: Initial blood pressure was 85/61 in the ER with a temperature 97.5 pulse 107 respiratory 21 oxygen sat 79% on room air he is currently 100% on 2 L.  White count is 4.0 hemoglobin 7.9 and platelets are clumped.  He has sodium 141 potassium 3.0 chloride 118 and CO2 20 BUN 20 creatinine 1.1 and calcium 6.5 with troponin 0 0.03.  Lactic acid was 1.22 fecal occult blood was negative x2.  Head CT without contrast is negative and chest x-ray showed no significant findings.  Hospital Course: MRI brain showed no acute stroke. Blood pressure has improved with fluids. No perfusion-limiting arrhythmias on telemetry. Due to history including subacute onset of  left lower extremity paresis in addition to RLE as well as bowel and bladder incontinence, MRI of the thoracic and lumbar spine was performed 01/31/2018 which demonstrated osteomyelitis/discitis at T8-9 and T9-10 with associated epidural phlegmon/abscess extending from T7-8 through T11-12, with severe spinal stenosis at the level of T9-10. Associated cord signal abnormality at the level of T9-10 compatible with edema and/or myelomalacia. Also noted possible mild/early discitis at L4-5 and L5-S1 without appreciable epidural collection or other abnormality. Sequelae of chronic discitis osteomyelitis at T12-L1 without evidence for significant or residual or recurrent infection. Small focus of chronic myelomalacia within the distal cord at this level.  Neurosurgery was consulted, felt that the patient's potential benefit from surgery would be marginal with months of symptoms and preexisting debility which would limit rehabilitation efforts, and risks are prohibitive. IR has been consulted for biopsy/aspiration for culture to guide antimicrobial therapy.  Assessment & Plan: Principal Problem:   Epidural abscess Active Problems:   Atrial fibrillation (HCC)   Hypothyroidism   Osteomyelitis of thoracic spine (HCC)   Type 2 diabetes mellitus with complication (HCC)   Other chronic pain   Alzheimer's type dementia (HCC)   Hypotension   Syncope  Hypotension: Possible orthostatic/hypotensive syncope. While the patient has an infection, there are no other indications of SIRS to suggest that hypotension was attributable solely to infection. Pt at very high risk for arrhythmia however. Cortisol sufficient. Echocardiogram without low CO.  - Monitor, improved.  Osteomyelitis/discitis at T8-9 and T9-10 with epidural abscess T7-T12: Also noted possible mild/early discitis at  L4-5 and L5-S1 without appreciable epidural collection or other abnormality. - IR to sample apidural abscess today, procedure delayed by bites  of applesauce with AM medications, still planned for later 1/10. Keep NPO.  - Appreciate ID input. Off abx prior to procedure, will follow abx recommendations regarding agent and duration.  Severe spinal stenosis at the level of T9-10 with associated cord edema and/or myelomalacia. Also with myelomalacia distally associated with prior osteo/discitis.  - Dr. Maurice Small, neurosurgery, evaluated the patient 1/7 recommending against surgery.  - Continue PT/OT. Pt and family relieved by certainty of diagnosis despite its minimal chance for recovery. - Associated spasms to be treated with tizanidine, ropinirole, iron supplementation in the event RLS is contributing. Has unknown reaction listed as allergy to gabapentin. Tolerating lyrica, will increase dose. Feels spasms less frequent overall.  History of discitis, spinal (thoracolumbar) osteomyelitis and recurrent MSSA infection including right hip: MRI demonstrated sequelae of chronic discitis osteomyelitis at T12-L1 without evidence for significant or residual or recurrent infection of the hip.  - Tx as above.  Nitrite-positive pyuria: Unclear significance, though ?if lower abdominal spasms constitute symptoms of UTI.  - Urine culture growing >100k Proteus. Recommendation to monitor off abx per ID.    Anemia of chronic disease and iron deficiency anemia: Reportedly has needed transfusions in the past for this, no bleeding, FOBT negative x2. Initial hemoglobin possibly spuriously low at 7.9, up to 10.8 and stable since. - Monitor in AM and transfuse prn symptoms or hgb 7g/dl - Continue iron supplementation  CKD stage III: CrCl remains >38ml/min currently, so may actually be stage II. Will continue monitoring.  - Taking PO well, so not on IVF's currently - Avoid nephrotoxins as able - Monitor BMP in AM  T2DM with hypoglycemia: - DC SSI, pioglitazone - Check CBGs qAM  Hypothyroidism: TSH 1.613.  - Continue synthroid  Atrial fibrillation: Rate  controlled.  - Xarelto to resume per IR. - Continue telemetry  Hypokalemia: Resolved with replacement. - Monitor intermittently, check in AM  Alzheimer's dementia:  - Continue home regimen, needs supervisory care. Mental status generally best during the mornings. - Not on medications at home.   HLD:  - Continue statin  Morbid obesity: BMI 35 - Weight loss recommended  DVT prophylaxis: SCDs  Code Status: Full Family Communication: Daughter at bedside Disposition Plan: SNF once work up and treatment complete. Anticipated DC date 1/13.  Consultants:   IR  ID  Procedures:   Echocardiogram 02/01/2018: - Left ventricle: The cavity size was normal. There was severe   focal basal hypertrophy. Systolic function was normal. The   estimated ejection fraction was in the range of 55% to 60%. Wall   motion was normal; there were no regional wall motion   abnormalities. The study was not technically sufficient to allow   evaluation of LV diastolic dysfunction due to atrial   fibrillation. - Aortic valve: Trileaflet; normal thickness, mildly calcified   leaflets. There was trivial regurgitation. - Aorta: Aortic root dimension: 40 mm (ED). Ascending aortic   diameter: 38 mm (S). - Aortic root: The aortic root was mildly dilated. - Ascending aorta: The ascending aorta was mildly dilated. - Left atrium: The atrium was mildly dilated. - Pericardium, extracardiac: A trivial, free-flowing pericardial   effusion was identified circumferential to the heart. The fluid   had no internal echoes.  Antimicrobials:  None   Subjective: No events overnight, denies fevers. Spasms less frequent, still very painful.   Objective: Vitals:   02/03/18  1642 02/03/18 2317 02/04/18 0744 02/04/18 0910  BP: (!) 153/89 135/80 (!) 146/93   Pulse: 78 81 67   Resp: 16 (!) 24    Temp: 100 F (37.8 C) 98.2 F (36.8 C)    TempSrc: Oral Oral    SpO2: 97% 95% 99%   Weight:    114.3 kg  Height:    5\' 11"   (1.803 m)    Intake/Output Summary (Last 24 hours) at 02/04/2018 1020 Last data filed at 02/04/2018 0600 Gross per 24 hour  Intake -  Output 1300 ml  Net -1300 ml   Filed Weights   02/01/18 0500 02/02/18 0346 02/04/18 0910  Weight: 114.3 kg 114.3 kg 114.3 kg   Gen: Pleasant, elderly male in no distress Pulm: Nonlabored breathing room air. Clear. CV: Regular rate and rhythm. No murmur, rub, or gallop. No JVD, trace dependent edema. GI: Abdomen soft, non-tender, non-distended, with normoactive bowel sounds.  Ext: Warm, no deformities Skin: No new rashes, lesions or ulcers on visualized skin. Neuro: Alert and oriented, mental status very good this AM. Flaccid paraplegia with minimal movement in distal RLE noted, sensation intact. Psych: Judgement and insight appear fair currently. Mood euthymic & affect congruent. Behavior is appropriate.    Data Reviewed: I have personally reviewed following labs and imaging studies  CBC: Recent Labs  Lab 01/30/18 1620 01/31/18 1143 02/01/18 0626 02/02/18 0256 02/03/18 0318  WBC 4.0 7.5 6.1 6.5 7.0  NEUTROABS 2.5  --   --   --   --   HGB 7.9* 10.8* 10.5* 10.1* 10.6*  HCT 26.4* 35.0* 33.8* 32.9* 34.6*  MCV 100.4* 97.0 97.4 95.6 96.9  PLT PLATELET CLUMPS NOTED ON SMEAR, COUNT APPEARS DECREASED 157 147* 144* 142*   Basic Metabolic Panel: Recent Labs  Lab 01/30/18 1620 01/31/18 1000 02/01/18 0626 02/02/18 0256 02/03/18 0318  NA 141 140 141 139 140  K 3.1* 4.1 4.4 4.4 4.5  CL 118* 111 111 111 110  CO2 20* 22 24 24 25   GLUCOSE 93 91 129* 99 98  BUN 20 18 16 15 13   CREATININE 1.11 1.14 1.07 1.02 1.04  CALCIUM 6.5* 8.4* 8.5* 8.6* 8.7*   GFR: Estimated Creatinine Clearance: 66.8 mL/min (by C-G formula based on SCr of 1.04 mg/dL). Liver Function Tests: Recent Labs  Lab 01/30/18 1620 01/31/18 1000  AST 11* 15  ALT 7 9  ALKPHOS 50 63  BILITOT 0.4 0.6  PROT 4.2* 5.8*  ALBUMIN 2.1* 2.9*   No results for input(s): LIPASE, AMYLASE  in the last 168 hours. No results for input(s): AMMONIA in the last 168 hours. Coagulation Profile: Recent Labs  Lab 02/02/18 0256  INR 1.30   Cardiac Enzymes: Recent Labs  Lab 01/30/18 2045 01/31/18 0249 01/31/18 1000  TROPONINI 0.03* 0.03* 0.04*   BNP (last 3 results) No results for input(s): PROBNP in the last 8760 hours. HbA1C: No results for input(s): HGBA1C in the last 72 hours. CBG: Recent Labs  Lab 02/02/18 2138 02/03/18 1208 02/03/18 1642 02/03/18 2115 02/04/18 0745  GLUCAP 83 109* 80 101* 84   Lipid Profile: No results for input(s): CHOL, HDL, LDLCALC, TRIG, CHOLHDL, LDLDIRECT in the last 72 hours. Thyroid Function Tests: No results for input(s): TSH, T4TOTAL, FREET4, T3FREE, THYROIDAB in the last 72 hours. Anemia Panel: No results for input(s): VITAMINB12, FOLATE, FERRITIN, TIBC, IRON, RETICCTPCT in the last 72 hours. Urine analysis:    Component Value Date/Time   COLORURINE YELLOW 01/31/2018 2048   APPEARANCEUR CLOUDY (A)  01/31/2018 2048   LABSPEC 1.015 01/31/2018 2048   PHURINE 5.0 01/31/2018 2048   GLUCOSEU NEGATIVE 01/31/2018 2048   HGBUR NEGATIVE 01/31/2018 2048   BILIRUBINUR NEGATIVE 01/31/2018 2048   KETONESUR NEGATIVE 01/31/2018 2048   PROTEINUR 30 (A) 01/31/2018 2048   UROBILINOGEN 0.2 12/30/2012 2015   NITRITE POSITIVE (A) 01/31/2018 2048   LEUKOCYTESUR LARGE (A) 01/31/2018 2048   Recent Results (from the past 240 hour(s))  Blood Culture (routine x 2)     Status: None   Collection Time: 01/30/18  4:27 PM  Result Value Ref Range Status   Specimen Description BLOOD LEFT ANTECUBITAL  Final   Special Requests   Final    BOTTLES DRAWN AEROBIC AND ANAEROBIC Blood Culture adequate volume   Culture   Final    NO GROWTH 5 DAYS Performed at [facility], [address] [place], [place] [address]    Report Status 02/04/2018 FINAL  Final  Blood Culture (routine x 2)     Status: None   Collection Time: 01/30/18  4:32 PM  Result Value  Ref Range Status   Specimen Description BLOOD RIGHT ANTECUBITAL  Final   Special Requests   Final    BOTTLES DRAWN AEROBIC AND ANAEROBIC Blood Culture adequate volume   Culture   Final    NO GROWTH 5 DAYS Performed at [facility], [address], [place], [place] [address]    Report Status 02/04/2018 FINAL  Final  MRSA PCR Screening     Status: None   Collection Time: 01/31/18 10:50 PM  Result Value Ref Range Status   MRSA by PCR NEGATIVE NEGATIVE Final    Comment:        The GeneXpert MRSA Assay (FDA approved for NASAL specimens only), is one component of a comprehensive MRSA colonization surveillance program. It is not intended to diagnose MRSA infection nor to guide or monitor treatment for MRSA infections. Performed at [facility], [address] [place], [place] [address]   Culture, Urine     Status: Abnormal   Collection Time: 02/01/18  2:28 PM  Result Value Ref Range Status   Specimen Description URINE, CATHETERIZED  Final   Special Requests   Final    NONE Performed at [facility], [address], [place], [place] [address]    Culture >=100,000 COLONIES/mL PROTEUS MIRABILIS (A)  Final   Report Status 02/03/2018 FINAL  Final   Organism ID, Bacteria PROTEUS MIRABILIS (A)  Final      Susceptibility   Proteus mirabilis - MIC*    AMPICILLIN <=2 SENSITIVE Sensitive     CEFAZOLIN <=4 SENSITIVE Sensitive     CEFTRIAXONE <=1 SENSITIVE Sensitive     CIPROFLOXACIN <=0.25 SENSITIVE Sensitive     GENTAMICIN <=1 SENSITIVE Sensitive     IMIPENEM 2 SENSITIVE Sensitive     NITROFURANTOIN 128 RESISTANT Resistant     TRIMETH/SULFA <=20 SENSITIVE Sensitive     AMPICILLIN/SULBACTAM <=2 SENSITIVE Sensitive     PIP/TAZO <=4 SENSITIVE Sensitive     * >=100,000 COLONIES/mL PROTEUS MIRABILIS      Radiology Studies: No results found.  Scheduled Meds: . [MAR Hold] divalproex  125 mg Oral QHS  . [MAR Hold] docusate sodium  100 mg Oral BID  . [MAR Hold]  escitalopram  15 mg Oral Daily  . [MAR Hold] ferrous sulfate  325 mg Oral Q breakfast  . [MAR Hold] finasteride  5 mg Oral QHS  . [facility] Hold]  lactulose  10 g Oral QHS  . [MAR Hold] lactulose  30 g Oral Daily  . [MAR Hold] levothyroxine  88 mcg Oral QAC breakfast  . [MAR Hold] lidocaine  1 patch Transdermal Q24H  . [MAR Hold] pravastatin  40 mg Oral q1800  . [MAR Hold] pregabalin  25 mg Oral Daily  . [MAR Hold] rOPINIRole  0.5 mg Oral QHS  . [MAR Hold] sodium chloride flush  3 mL Intravenous Q12H   Continuous Infusions: . lactated ringers 10 mL/hr at 02/04/18 0913     LOS: 5 days   Time spent: 25 minutes.  Tyrone Nine, MD [facility] [email or web address] Password [email or web address] 02/04/2018, 10:20 AM

## 2018-02-04 NOTE — Transfer of Care (Signed)
Immediate Anesthesia Transfer of Care Note  Patient: [name]  Procedure(s) Performed: Spinal biopsy (N/A )  Patient Location: PACU  Anesthesia Type:General  Level of Consciousness: awake and alert   Airway & Oxygen Therapy: Patient Spontanous Breathing and Patient connected to face mask oxygen  Post-op Assessment: Report given to RN and Post -op Vital signs reviewed and stable  Post vital signs: Reviewed and stable  Last Vitals:  Vitals Value Taken Time  BP 128/59 02/04/2018  3:15 PM  Temp    Pulse 86 02/04/2018  3:23 PM  Resp 20 02/04/2018  3:23 PM  SpO2 91 % 02/04/2018  3:23 PM  Vitals shown include unvalidated device data.  Last Pain:  Vitals:   02/04/18 0300  TempSrc:   PainSc: 0-No pain         Complications: No apparent anesthesia complications

## 2018-02-04 NOTE — Procedures (Signed)
S/P Fluoro guided disc aspiration at T9/T 10. 2.Fluoro guided bone biopsy at T 9

## 2018-02-04 NOTE — Anesthesia Procedure Notes (Signed)
Procedure Name: Intubation Date/Time: 02/04/2018 2:00 PM Performed by: Jodell Cipro, CRNA Pre-anesthesia Checklist: Patient identified, Emergency Drugs available, Suction available and Patient being monitored Patient Re-evaluated:Patient Re-evaluated prior to induction Oxygen Delivery Method: Circle System Utilized Preoxygenation: Pre-oxygenation with 100% oxygen Induction Type: IV induction Ventilation: Oral airway inserted - appropriate to patient size, Two handed mask ventilation required and Mask ventilation without difficulty Laryngoscope Size: Glidescope and 4 Grade View: Grade I Tube type: Oral Number of attempts: 1 Airway Equipment and Method: Rigid stylet and Video-laryngoscopy Placement Confirmation: ETT inserted through vocal cords under direct vision,  positive ETCO2 and breath sounds checked- equal and bilateral Secured at: 22 cm Tube secured with: Tape Dental Injury: Teeth and Oropharynx as per pre-operative assessment

## 2018-02-04 NOTE — Progress Notes (Signed)
Patient delayed due to having applesauce with medication.  Dr. Armond HangWoodrum aware, CRNA aware, OR desk aware.

## 2018-02-04 NOTE — Progress Notes (Signed)
    [facility]  Date of Admission:  01/30/2018          [name] has re-current discitis/osteomyelits of the thoracic/lumbar region and new epidural abscess now status post IR aspiration and cultures. Remains stable with no systemic symptoms and is afebrile. Gram stain and cultures are pending. Will continue to monitor off antibiotics pending gram stain and cultures results.   Dr. Orvan Falconer will follow this weekend.    Principal Problem:   Epidural abscess Active Problems:   Osteomyelitis of thoracic spine (HCC)   Atrial fibrillation (HCC)   Hypothyroidism   Type 2 diabetes mellitus with complication (HCC)   Other chronic pain   Alzheimer's type dementia (HCC)   Hypotension   Syncope    Marcos Eke, NP [facility] [phone] Pager  02/04/2018  4:57 PM

## 2018-02-04 NOTE — Sedation Documentation (Signed)
Patient under the care of anesthesia 

## 2018-02-04 NOTE — Progress Notes (Signed)
Patient's spinal biopsy delayed at this time due to applesauce with meds at 7:30am. Patient and daughter informed of delay. Informed floor RN to keep patient NPO. Verbalized understanding. Patient taken back to 2W by two RNs.

## 2018-02-05 DIAGNOSIS — L8990 Pressure ulcer of unspecified site, unspecified stage: Secondary | ICD-10-CM

## 2018-02-05 LAB — CBC
HCT: 33.7 % — ABNORMAL LOW (ref 39.0–52.0)
HEMOGLOBIN: 10.4 g/dL — AB (ref 13.0–17.0)
MCH: 29.6 pg (ref 26.0–34.0)
MCHC: 30.9 g/dL (ref 30.0–36.0)
MCV: 96 fL (ref 80.0–100.0)
Platelets: 155 10*3/uL (ref 150–400)
RBC: 3.51 MIL/uL — AB (ref 4.22–5.81)
RDW: 14.3 % (ref 11.5–15.5)
WBC: 7.2 10*3/uL (ref 4.0–10.5)
nRBC: 0 % (ref 0.0–0.2)

## 2018-02-05 LAB — GLUCOSE, CAPILLARY
Glucose-Capillary: 100 mg/dL — ABNORMAL HIGH (ref 70–99)
Glucose-Capillary: 92 mg/dL (ref 70–99)
Glucose-Capillary: 85 mg/dL (ref 70–99)
Glucose-Capillary: 114 mg/dL — ABNORMAL HIGH (ref 70–99)

## 2018-02-05 LAB — BASIC METABOLIC PANEL
Anion gap: 5 (ref 5–15)
BUN: 13 mg/dL (ref 8–23)
CO2: 26 mmol/L (ref 22–32)
Calcium: 8.5 mg/dL — ABNORMAL LOW (ref 8.9–10.3)
Chloride: 107 mmol/L (ref 98–111)
Creatinine, Ser: 0.99 mg/dL (ref 0.61–1.24)
GFR calc Af Amer: >60 mL/min (ref >60)
GFR calc non Af Amer: >60 mL/min (ref >60)
Glucose, Bld: 114 mg/dL — ABNORMAL HIGH (ref 70–99)
Potassium: 4.4 mmol/L (ref 3.5–5.1)
Sodium: 138 mmol/L (ref 135–145)

## 2018-02-05 NOTE — Progress Notes (Signed)
CSW continuing to follow the patient through the weekend until medically stable to return to [facility].   Drucilla Schmidt, MSW, LCSW-A Clinical Social Worker [name]

## 2018-02-05 NOTE — Progress Notes (Signed)
Patient ID: [name], male   DOB: 1930-04-20, 85 y.o.   MRN: [id number]          [facility]    Date of Admission:  01/30/2018     Mr. Cornacchia tolerated aspiration of his thoracic epidural abscess yesterday.  He is sleeping soundly today and I did not wake him.  I did speak with 1 of his daughters.  Gram stain of his abscess fluid showed no organisms and cultures are pending.  Continue observation off of antibiotics for now.  Cliffton Asters, MD [facility] [phone] pager   [phone] cell 02/05/2018, 1:20 PM

## 2018-02-05 NOTE — Progress Notes (Signed)
PROGRESS NOTE  [name]  ZMO:[id number] DOB: 1930-02-28 DOA: 01/30/2018 PCP: Malka So., MD   Brief Narrative: [name] is an 85 y.o. 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  Patient has apparently been having these episodes of confusion on and off for a while.  Today however he had syncopal episode.  When EMS arrived blood pressure was 72/40.  He received up to 700 cc bolus of normal saline and brought to the ER where he remained hypotensive with systolic in the 80s.  He received couple of liters of IV fluid and systolic is up to 120 at the moment.  Patient has reportedly been nauseated in the last couple of days has not been eating or drinking adequately.  No diarrhea no vomiting.  He has some chest discomfort with this.  But throughout the week his confusion has progressively worsened.  Patient is therefore being diagnosed with syncopal episode hypotension and possible arrhythmia.  He is still in atrial fibrillation but the rate is controlled and he is fully anticoagulated.  ED Course: Initial blood pressure was 85/61 in the ER with a temperature 97.5 pulse 107 respiratory 21 oxygen sat 79% on room air he is currently 100% on 2 L.  White count is 4.0 hemoglobin 7.9 and platelets are clumped.  He has sodium 141 potassium 3.0 chloride 118 and CO2 20 BUN 20 creatinine 1.1 and calcium 6.5 with troponin 0 0.03.  Lactic acid was 1.22 fecal occult blood was negative x2.  Head CT without contrast is negative and chest x-ray showed no significant findings.  Hospital Course: MRI brain showed no acute stroke. Blood pressure has improved with fluids. No perfusion-limiting arrhythmias on telemetry. Due to history including subacute onset of  left lower extremity paresis in addition to RLE as well as bowel and bladder incontinence, MRI of the thoracic and lumbar spine was performed 01/31/2018 which demonstrated osteomyelitis/discitis at T8-9 and T9-10 with associated epidural phlegmon/abscess extending from T7-8 through T11-12, with severe spinal stenosis at the level of T9-10. Associated cord signal abnormality at the level of T9-10 compatible with edema and/or myelomalacia. Also noted possible mild/early discitis at L4-5 and L5-S1 without appreciable epidural collection or other abnormality. Sequelae of chronic discitis osteomyelitis at T12-L1 without evidence for significant or residual or recurrent infection. Small focus of chronic myelomalacia within the distal cord at this level.  Neurosurgery was consulted, felt that the patient's potential benefit from surgery would be marginal with months of symptoms and preexisting debility which would limit rehabilitation efforts, and risks are prohibitive. IR has been consulted for biopsy/aspiration for culture to guide antimicrobial therapy.  Assessment & Plan: Principal Problem:   Epidural abscess Active Problems:   Atrial fibrillation (HCC)   Hypothyroidism   Osteomyelitis of thoracic spine (HCC)   Type 2 diabetes mellitus with complication (HCC)   Other chronic pain   Alzheimer's type dementia (HCC)   Hypotension   Syncope   Pressure injury of skin  Hypotension: Possible orthostatic/hypotensive syncope. While the patient has an infection, there are no other indications of SIRS to suggest that hypotension was attributable solely to infection. Pt at very high risk for arrhythmia however. Cortisol sufficient. Echocardiogram without low CO.  - Monitor, improved.  Osteomyelitis/discitis at T8-9 and T9-10 with epidural abscess T7-T12:  s/p disc aspiration T9-10 and bone biopsy T9. On imaging, also noted possible mild/early discitis at L4-5 and L5-S1 without appreciable epidural collection or  other abnormality. - Appreciate ID input. Off abx pending culture data due to hemodynamic stability. Negative gram stain. Cultures < 24 hours aged.   Severe spinal stenosis at the level of T9-10 with associated cord edema and/or myelomalacia. Also with myelomalacia distally associated with prior osteo/discitis.  - Dr. Maurice Smallstergard, neurosurgery, evaluated the patient 1/7 recommending against surgery.  - Continue PT/OT. Pt and family relieved by certainty of diagnosis despite its minimal chance for recovery. - Associated spasms to be treated with tizanidine, ropinirole, iron supplementation in the event RLS is contributing. Has unknown reaction listed as allergy to gabapentin. Tolerating lyrica, will continue 50mg  dose to avoid sedation. Feels spasms less frequent overall.  History of discitis, spinal (thoracolumbar) osteomyelitis and recurrent MSSA infection including right hip: MRI demonstrated sequelae of chronic discitis osteomyelitis at T12-L1 without evidence for significant or residual or recurrent infection of the hip.  - Tx as above.  Nitrite-positive pyuria: Unclear significance, though ?if lower abdominal spasms constitute symptoms of UTI.  - Urine culture growing >100k Proteus. Recommendation to monitor off abx per ID.    Anemia of chronic disease and iron deficiency anemia: Reportedly has needed transfusions in the past for this, no bleeding, FOBT negative x2. Initial hemoglobin possibly spuriously low at 7.9, up to 10.8 and stable since. - Hgb stable. Will not plan on rechecking to avoid iatrogenic anemia. - Continue iron supplementation  CKD stage III: CrCl remains >3960ml/min currently, so may actually be stage II. Will continue monitoring.  - Taking PO well, so not on IVF's currently, CrCl 4570ml/min. - Avoid nephrotoxins as able  T2DM with hypoglycemia: - DC'ed SSI, pioglitazone. Euglycemic since. - Check CBGs qAM  Hypothyroidism: TSH 1.613.  - Continue synthroid  Atrial  fibrillation: Rate controlled.  - Xarelto to resume per IR. - Continue telemetry  Hypokalemia: Resolved with replacement. - Monitor intermittently, check in AM  Alzheimer's dementia: Still intermittently able to share decision making. - Continue home regimen, needs supervisory care. Mental status generally best during the mornings. - Not on medications at home.   HLD:  - Continue statin  Morbid obesity: BMI 35 - Weight loss recommended  DVT prophylaxis: SCDs  Code Status: Full Family Communication: Daughter at bedside Disposition Plan: SNF once work up and treatment complete. Anticipated DC date 1/13.  Consultants:   IR  ID  Procedures:   Echocardiogram 02/01/2018: - Left ventricle: The cavity size was normal. There was severe   focal basal hypertrophy. Systolic function was normal. The   estimated ejection fraction was in the range of 55% to 60%. Wall   motion was normal; there were no regional wall motion   abnormalities. The study was not technically sufficient to allow   evaluation of LV diastolic dysfunction due to atrial   fibrillation. - Aortic valve: Trileaflet; normal thickness, mildly calcified   leaflets. There was trivial regurgitation. - Aorta: Aortic root dimension: 40 mm (ED). Ascending aortic   diameter: 38 mm (S). - Aortic root: The aortic root was mildly dilated. - Ascending aorta: The ascending aorta was mildly dilated. - Left atrium: The atrium was mildly dilated. - Pericardium, extracardiac: A trivial, free-flowing pericardial   effusion was identified circumferential to the heart. The fluid   had no internal echoes.  02/04/2018 Dr. Corliss Skainseveshwar:  Fluoro-guided disc aspiration T9-T10. Fluoro-guided bone biopsy T9  Antimicrobials:  None  Subjective: Had back pain following procedure yesterday, but recovered well, feels spasms are less frequent. Daughter reports he's been sleeping most of the day but has been appropriately rousable.    Objective: Vitals:   02/04/18 1630 02/04/18 1725 02/04/18 2354 02/05/18 0826  BP: (!) 143/86 (!) 162/77 128/78 136/74  Pulse: 67 61 65 72  Resp: 14  16   Temp: (!) 97.5 F (36.4 C) (!) 97.5 F (36.4 C) 98 F (36.7 C) 98.7 F (37.1 C)  TempSrc:  Oral Oral Oral  SpO2: 98% 98% 99% 98%  Weight:      Height:        Intake/Output Summary (Last 24 hours) at 02/05/2018 1523 Last data filed at 02/05/2018 0600 Gross per 24 hour  Intake 1260 ml  Output 1250 ml  Net 10 ml   Filed Weights   02/01/18 0500 02/02/18 0346 02/04/18 0910  Weight: 114.3 kg 114.3 kg 114.3 kg   Gen: Pleasant elderly male in no distress Pulm: Nonlabored breathing with nasal cannula on his forehead, continuous pulse oximetry >93% when awake and asleep. Clear. CV: Irreg irreg, no murmur, rub, or gallop. No JVD, trace-1+ dependent edema. GI: Abdomen soft, non-tender, non-distended, with normoactive bowel sounds.  Ext: Warm, no deformities Skin: No new rashes, lesions or ulcers on visualized skin. Not able to roll for back evaluation this morning. Neuro: Sleeping but then alert and oriented, conversant. Very minimal strength distal RLE, none LLE, intermittent LLE flexion spasms. No other/new focal neurological deficits. Psych: Judgement and insight appear fair. Mood euthymic & affect congruent. Behavior is appropriate.    Data Reviewed: I have personally reviewed following labs and imaging studies  CBC: Recent Labs  Lab 01/30/18 1620 01/31/18 1143 02/01/18 0626 02/02/18 0256 02/03/18 0318 02/05/18 0233  WBC 4.0 7.5 6.1 6.5 7.0 7.2  NEUTROABS 2.5  --   --   --   --   --   HGB 7.9* 10.8* 10.5* 10.1* 10.6* 10.4*  HCT 26.4* 35.0* 33.8* 32.9* 34.6* 33.7*  MCV 100.4* 97.0 97.4 95.6 96.9 96.0  PLT PLATELET CLUMPS NOTED ON SMEAR, COUNT APPEARS DECREASED 157 147* 144* 142* 155   Basic Metabolic Panel: Recent Labs  Lab 01/31/18 1000 02/01/18 0626 02/02/18 0256 02/03/18 0318 02/05/18 0233  NA 140 141 139  140 138  K 4.1 4.4 4.4 4.5 4.4  CL 111 111 111 110 107  CO2 22 24 24 25 26   GLUCOSE 91 129* 99 98 114*  BUN 18 16 15 13 13   CREATININE 1.14 1.07 1.02 1.04 0.99  CALCIUM 8.4* 8.5* 8.6* 8.7* 8.5*   GFR: Estimated Creatinine Clearance: 70.1 mL/min (by C-G formula based on SCr of 0.99 mg/dL). Liver Function Tests: Recent Labs  Lab 01/30/18 1620 01/31/18 1000  AST 11* 15  ALT 7 9  ALKPHOS 50 63  BILITOT 0.4 0.6  PROT 4.2* 5.8*  ALBUMIN 2.1* 2.9*   No results for input(s): LIPASE, AMYLASE in the last 168 hours. No results for input(s): AMMONIA in the last 168 hours. Coagulation Profile: Recent Labs  Lab 02/02/18 0256  INR 1.30   Cardiac Enzymes: Recent Labs  Lab 01/30/18 2045 01/31/18 0249 01/31/18 1000  TROPONINI 0.03* 0.03* 0.04*   BNP (last 3 results) No results for input(s): PROBNP in the last 8760 hours. HbA1C: No results for input(s): HGBA1C in the last 72 hours. CBG: Recent Labs  Lab 02/04/18 1608 02/04/18 1725 02/04/18 2116 02/05/18 0827 02/05/18 1227  GLUCAP 114* 109* 95 100* 92  Lipid Profile: No results for input(s): CHOL, HDL, LDLCALC, TRIG, CHOLHDL, LDLDIRECT in the last 72 hours. Thyroid Function Tests: No results for input(s): TSH, T4TOTAL, FREET4, T3FREE, THYROIDAB in the last 72 hours. Anemia Panel: No results for input(s): VITAMINB12, FOLATE, FERRITIN, TIBC, IRON, RETICCTPCT in the last 72 hours. Urine analysis:    Component Value Date/Time   COLORURINE YELLOW 01/31/2018 2048   APPEARANCEUR CLOUDY (A) 01/31/2018 2048   LABSPEC 1.015 01/31/2018 2048   PHURINE 5.0 01/31/2018 2048   GLUCOSEU NEGATIVE 01/31/2018 2048   HGBUR NEGATIVE 01/31/2018 2048   BILIRUBINUR NEGATIVE 01/31/2018 2048   KETONESUR NEGATIVE 01/31/2018 2048   PROTEINUR 30 (A) 01/31/2018 2048   UROBILINOGEN 0.2 12/30/2012 2015   NITRITE POSITIVE (A) 01/31/2018 2048   LEUKOCYTESUR LARGE (A) 01/31/2018 2048   Recent Results (from the past 240 hour(s))  Blood Culture  (routine x 2)     Status: None   Collection Time: 01/30/18  4:27 PM  Result Value Ref Range Status   Specimen Description BLOOD LEFT ANTECUBITAL  Final   Special Requests   Final    BOTTLES DRAWN AEROBIC AND ANAEROBIC Blood Culture adequate volume   Culture   Final    NO GROWTH 5 DAYS Performed at [facility], [address] [place], [place] [address]    Report Status 02/04/2018 FINAL  Final  Blood Culture (routine x 2)     Status: None   Collection Time: 01/30/18  4:32 PM  Result Value Ref Range Status   Specimen Description BLOOD RIGHT ANTECUBITAL  Final   Special Requests   Final    BOTTLES DRAWN AEROBIC AND ANAEROBIC Blood Culture adequate volume   Culture   Final    NO GROWTH 5 DAYS Performed at [facility], [address], [place], [place] [address]    Report Status 02/04/2018 FINAL  Final  MRSA PCR Screening     Status: None   Collection Time: 01/31/18 10:50 PM  Result Value Ref Range Status   MRSA by PCR NEGATIVE NEGATIVE Final    Comment:        The GeneXpert MRSA Assay (FDA approved for NASAL specimens only), is one component of a comprehensive MRSA colonization surveillance program. It is not intended to diagnose MRSA infection nor to guide or monitor treatment for MRSA infections. Performed at [facility], [address], [place], [place] [address]   Culture, Urine     Status: Abnormal   Collection Time: 02/01/18  2:28 PM  Result Value Ref Range Status   Specimen Description URINE, CATHETERIZED  Final   Special Requests   Final    NONE Performed at [facility], [address], [place], [place] [address]    Culture >=100,000 COLONIES/mL PROTEUS MIRABILIS (A)  Final   Report Status 02/03/2018 FINAL  Final   Organism ID, Bacteria PROTEUS MIRABILIS (A)  Final      Susceptibility   Proteus mirabilis - MIC*    AMPICILLIN <=2 SENSITIVE Sensitive     CEFAZOLIN <=4 SENSITIVE Sensitive     CEFTRIAXONE <=1 SENSITIVE Sensitive      CIPROFLOXACIN <=0.25 SENSITIVE Sensitive     GENTAMICIN <=1 SENSITIVE Sensitive     IMIPENEM 2 SENSITIVE Sensitive     NITROFURANTOIN 128 RESISTANT Resistant     TRIMETH/SULFA <=20 SENSITIVE Sensitive     AMPICILLIN/SULBACTAM <=2 SENSITIVE Sensitive     PIP/TAZO <=4 SENSITIVE Sensitive     * >=100,000 COLONIES/mL PROTEUS  MIRABILIS  Aerobic/Anaerobic Culture (surgical/deep wound)     Status: None (Preliminary result)   Collection Time: 02/04/18  3:34 PM  Result Value Ref Range Status   Specimen Description BACK  Final   Special Requests DISK SPACE  Final   Gram Stain NO WBC SEEN NO ORGANISMS SEEN   Final   Culture   Final    NO GROWTH < 24 HOURS Performed at [facility], [address] [place], [place] [address]    Report Status PENDING  Incomplete  Aerobic/Anaerobic Culture (surgical/deep wound)     Status: None (Preliminary result)   Collection Time: 02/04/18  3:56 PM  Result Value Ref Range Status   Specimen Description BONE  Final   Special Requests NONE  Final   Gram Stain NO WBC SEEN NO ORGANISMS SEEN   Final   Culture   Final    NO GROWTH < 24 HOURS Performed at [facility], [address] [place], [place] [address]    Report Status PENDING  Incomplete      Radiology Studies: No results found.  Scheduled Meds: . divalproex  125 mg Oral QHS  . docusate sodium  100 mg Oral BID  . escitalopram  15 mg Oral Daily  . ferrous sulfate  325 mg Oral Q breakfast  . finasteride  5 mg Oral QHS  . lactulose  10 g Oral QHS  . lactulose  30 g Oral Daily  . levothyroxine  88 mcg Oral QAC breakfast  . lidocaine  1 patch Transdermal Q24H  . pravastatin  40 mg Oral q1800  . pregabalin  50 mg Oral Daily  . rOPINIRole  0.5 mg Oral QHS  . sodium chloride flush  3 mL Intravenous Q12H   Continuous Infusions: . lactated ringers 10 mL/hr at 02/04/18 0913     LOS: 6 days   Time spent: 25 minutes.  Tyrone Nineyan B Vylet Maffia, MD [facility] [email or web address] Password  [facility] 02/05/2018, 3:23 PM

## 2018-02-06 ENCOUNTER — Inpatient Hospital Stay: Payer: Self-pay

## 2018-02-06 DIAGNOSIS — Z7901 Long term (current) use of anticoagulants: Secondary | ICD-10-CM

## 2018-02-06 DIAGNOSIS — B9561 Methicillin susceptible Staphylococcus aureus infection as the cause of diseases classified elsewhere: Secondary | ICD-10-CM

## 2018-02-06 LAB — GLUCOSE, CAPILLARY: Glucose-Capillary: 82 mg/dL (ref 70–99)

## 2018-02-06 MED ORDER — PREGABALIN 75 MG PO CAPS
75.0000 mg | ORAL_CAPSULE | Freq: Every day | ORAL | Status: DC
  Administered 2018-02-07: 75 mg via ORAL
  Filled 2018-02-06: qty 1

## 2018-02-06 MED ORDER — CEFAZOLIN SODIUM-DEXTROSE 2-4 GM/100ML-% IV SOLN
2.0000 g | Freq: Three times a day (TID) | INTRAVENOUS | Status: DC
  Administered 2018-02-06 – 2018-02-07 (×3): 2 g via INTRAVENOUS
  Filled 2018-02-06 (×4): qty 100

## 2018-02-06 MED ORDER — RIVAROXABAN 20 MG PO TABS
20.0000 mg | ORAL_TABLET | Freq: Every day | ORAL | Status: DC
  Administered 2018-02-06: 20 mg via ORAL
  Filled 2018-02-06: qty 1

## 2018-02-06 NOTE — Progress Notes (Signed)
PROGRESS NOTE  [name]  ZMO:[id number] DOB: 1930-02-28 DOA: 01/30/2018 PCP: Malka So., MD   Brief Narrative: [name] is an 85 y.o. 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  Patient has apparently been having these episodes of confusion on and off for a while.  Today however he had syncopal episode.  When EMS arrived blood pressure was 72/40.  He received up to 700 cc bolus of normal saline and brought to the ER where he remained hypotensive with systolic in the 80s.  He received couple of liters of IV fluid and systolic is up to 120 at the moment.  Patient has reportedly been nauseated in the last couple of days has not been eating or drinking adequately.  No diarrhea no vomiting.  He has some chest discomfort with this.  But throughout the week his confusion has progressively worsened.  Patient is therefore being diagnosed with syncopal episode hypotension and possible arrhythmia.  He is still in atrial fibrillation but the rate is controlled and he is fully anticoagulated.  ED Course: Initial blood pressure was 85/61 in the ER with a temperature 97.5 pulse 107 respiratory 21 oxygen sat 79% on room air he is currently 100% on 2 L.  White count is 4.0 hemoglobin 7.9 and platelets are clumped.  He has sodium 141 potassium 3.0 chloride 118 and CO2 20 BUN 20 creatinine 1.1 and calcium 6.5 with troponin 0 0.03.  Lactic acid was 1.22 fecal occult blood was negative x2.  Head CT without contrast is negative and chest x-ray showed no significant findings.  Hospital Course: MRI brain showed no acute stroke. Blood pressure has improved with fluids. No perfusion-limiting arrhythmias on telemetry. Due to history including subacute onset of  left lower extremity paresis in addition to RLE as well as bowel and bladder incontinence, MRI of the thoracic and lumbar spine was performed 01/31/2018 which demonstrated osteomyelitis/discitis at T8-9 and T9-10 with associated epidural phlegmon/abscess extending from T7-8 through T11-12, with severe spinal stenosis at the level of T9-10. Associated cord signal abnormality at the level of T9-10 compatible with edema and/or myelomalacia. Also noted possible mild/early discitis at L4-5 and L5-S1 without appreciable epidural collection or other abnormality. Sequelae of chronic discitis osteomyelitis at T12-L1 without evidence for significant or residual or recurrent infection. Small focus of chronic myelomalacia within the distal cord at this level.  Neurosurgery was consulted, felt that the patient's potential benefit from surgery would be marginal with months of symptoms and preexisting debility which would limit rehabilitation efforts, and risks are prohibitive. IR has been consulted for biopsy/aspiration for culture to guide antimicrobial therapy.  Assessment & Plan: Principal Problem:   Epidural abscess Active Problems:   Atrial fibrillation (HCC)   Hypothyroidism   Osteomyelitis of thoracic spine (HCC)   Type 2 diabetes mellitus with complication (HCC)   Other chronic pain   Alzheimer's type dementia (HCC)   Hypotension   Syncope   Pressure injury of skin  Hypotension: Possible orthostatic/hypotensive syncope. While the patient has an infection, there are no other indications of SIRS to suggest that hypotension was attributable solely to infection. Pt at very high risk for arrhythmia however. Cortisol sufficient. Echocardiogram without low CO.  - Monitor, improved.  Osteomyelitis/discitis at T8-9 and T9-10 with epidural abscess T7-T12:  s/p disc aspiration T9-10 and bone biopsy T9. On imaging, also noted possible mild/early discitis at L4-5 and L5-S1 without appreciable epidural collection or  other abnormality. - Awaiting ID recommendations. No agents isolated yet.  Severe spinal stenosis at the level of T9-10 with associated cord edema and/or myelomalacia. Also with myelomalacia distally associated with prior osteo/discitis.  - Dr. Maurice Smallstergard, neurosurgery, evaluated the patient 1/7 recommending against surgery.  - Continue PT/OT. Pt and family relieved by certainty of diagnosis despite its minimal chance for recovery. - Associated spasms to be treated with tizanidine, ropinirole, iron supplementation in the event RLS is contributing. Has unknown reaction listed as allergy to gabapentin. Tolerating lyrica, will increase to 75mg . Feels spasms less frequent overall.  History of discitis, spinal (thoracolumbar) osteomyelitis and recurrent MSSA infection including right hip: MRI demonstrated sequelae of chronic discitis osteomyelitis at T12-L1 without evidence for significant or residual or recurrent infection of the hip.  - Tx as above.  Nitrite-positive pyuria: Unclear significance, though ?if lower abdominal spasms constitute symptoms of UTI.  - Urine culture growing >100k Proteus. Recommendation to monitor off abx per ID.    Anemia of chronic disease and iron deficiency anemia: Reportedly has needed transfusions in the past for this, no bleeding, FOBT negative x2. Initial hemoglobin possibly spuriously low at 7.9, up to 10.8 and stable since. - Hgb stable. Will not plan on rechecking to avoid iatrogenic anemia. - Continue iron supplementation  CKD stage III: CrCl remains >9360ml/min currently, so may actually be stage II. Will continue monitoring.  - Taking PO well, so not on IVF's currently, CrCl 2970ml/min. - Avoid nephrotoxins as able  T2DM with hypoglycemia: - DC'ed SSI, pioglitazone. Euglycemic since. - Check CBGs qAM  Hypothyroidism: TSH 1.613.  - Continue synthroid  Atrial fibrillation: Rate controlled.  - Will resume xarelto today. - Continue telemetry  Hypokalemia:  Resolved with replacement. - Monitor intermittently, check in AM  Alzheimer's dementia: Still intermittently able to share decision making. - Continue home regimen, needs supervisory care. Mental status generally best during the mornings. - Not on medications at home.   HLD:  - Continue statin  Morbid obesity: BMI 35 - Weight loss recommended  DVT prophylaxis: SCDs  Code Status: Full Family Communication: None at bedside Disposition Plan: SNF once work up and treatment complete. Anticipated DC date 1/13.  Consultants:   IR  ID  Procedures:   Echocardiogram 02/01/2018: - Left ventricle: The cavity size was normal. There was severe   focal basal hypertrophy. Systolic function was normal. The   estimated ejection fraction was in the range of 55% to 60%. Wall   motion was normal; there were no regional wall motion   abnormalities. The study was not technically sufficient to allow   evaluation of LV diastolic dysfunction due to atrial   fibrillation. - Aortic valve: Trileaflet; normal thickness, mildly calcified   leaflets. There was trivial regurgitation. - Aorta: Aortic root dimension: 40 mm (ED). Ascending aortic   diameter: 38 mm (S). - Aortic root: The aortic root was mildly dilated. - Ascending aorta: The ascending aorta was mildly dilated. - Left atrium: The atrium was mildly dilated. - Pericardium, extracardiac: A trivial, free-flowing pericardial   effusion was identified circumferential to the heart. The fluid   had no internal echoes.  02/04/2018 Dr. Corliss Skainseveshwar:  Fluoro-guided disc aspiration T9-T10. Fluoro-guided bone biopsy T9  Antimicrobials:  None   Subjective: Spasms in left leg are severe, intermittent, slightly less frequent per pt report. No fevers, chills,  or evidence of bleeding. Pt very alert and oriented today.  Objective: Vitals:   02/05/18 0826 02/05/18 1727 02/06/18 0029 02/06/18 0802  BP: 136/74 (!) 157/99 134/84 (!) 143/98  Pulse: 72 76  86 73  Resp:   20   Temp: 98.7 F (37.1 C) 98.5 F (36.9 C) 98.3 F (36.8 C) 98.6 F (37 C)  TempSrc: Oral Oral Oral Oral  SpO2: 98% 100% 98% 94%  Weight:      Height:        Intake/Output Summary (Last 24 hours) at 02/06/2018 1448 Last data filed at 02/06/2018 0945 Gross per 24 hour  Intake 933 ml  Output 375 ml  Net 558 ml   Filed Weights   02/01/18 0500 02/02/18 0346 02/04/18 0910  Weight: 114.3 kg 114.3 kg 114.3 kg   Gen: Elderly male in no distress Pulm: Nonlabored breathing room air. Clear. CV: Irreg irreg, no murmur, rub, or gallop. No JVD, trace dependent edema. GI: Abdomen soft, non-tender, non-distended, with normoactive bowel sounds.  Ext: Warm, LLE held in flexion, stable. Skin: No new rashes, lesions or ulcers on visualized skin. Neuro: Alert and oriented. no LLE strength, 0-1/5 RLE strength, sensation intact. No new focal neurological deficits. Psych: Judgement and insight appear fair. Mood euthymic & affect congruent. Behavior is appropriate.    Data Reviewed: I have personally reviewed following labs and imaging studies  CBC: Recent Labs  Lab 01/30/18 1620 01/31/18 1143 02/01/18 0626 02/02/18 0256 02/03/18 0318 02/05/18 0233  WBC 4.0 7.5 6.1 6.5 7.0 7.2  NEUTROABS 2.5  --   --   --   --   --   HGB 7.9* 10.8* 10.5* 10.1* 10.6* 10.4*  HCT 26.4* 35.0* 33.8* 32.9* 34.6* 33.7*  MCV 100.4* 97.0 97.4 95.6 96.9 96.0  PLT PLATELET CLUMPS NOTED ON SMEAR, COUNT APPEARS DECREASED 157 147* 144* 142* 155   Basic Metabolic Panel: Recent Labs  Lab 01/31/18 1000 02/01/18 0626 02/02/18 0256 02/03/18 0318 02/05/18 0233  NA 140 141 139 140 138  K 4.1 4.4 4.4 4.5 4.4  CL 111 111 111 110 107  CO2 22 24 24 25 26   GLUCOSE 91 129* 99 98 114*  BUN 18 16 15 13 13   CREATININE 1.14 1.07 1.02 1.04 0.99  CALCIUM 8.4* 8.5* 8.6* 8.7* 8.5*   GFR: Estimated Creatinine Clearance: 70.1 mL/min (by C-G formula based on SCr of 0.99 mg/dL).  Liver Function Tests: Recent  Labs  Lab 01/30/18 1620 01/31/18 1000  AST 11* 15  ALT 7 9  ALKPHOS 50 63  BILITOT 0.4 0.6  PROT 4.2* 5.8*  ALBUMIN 2.1* 2.9*   Coagulation Profile: Recent Labs  Lab 02/02/18 0256  INR 1.30   Cardiac Enzymes: Recent Labs  Lab 01/30/18 2045 01/31/18 0249 01/31/18 1000  TROPONINI 0.03* 0.03* 0.04*   CBG: Recent Labs  Lab 02/05/18 0827 02/05/18 1227 02/05/18 1730 02/05/18 2050 02/06/18 0803  GLUCAP 100* 92 85 114* 82   Urine analysis:    Component Value Date/Time   COLORURINE YELLOW 01/31/2018 2048   APPEARANCEUR CLOUDY (A) 01/31/2018 2048   LABSPEC 1.015 01/31/2018 2048   PHURINE 5.0 01/31/2018 2048   GLUCOSEU NEGATIVE 01/31/2018 2048   HGBUR NEGATIVE 01/31/2018 2048   BILIRUBINUR NEGATIVE 01/31/2018 2048   KETONESUR NEGATIVE 01/31/2018 2048   PROTEINUR 30 (A) 01/31/2018 2048   UROBILINOGEN 0.2 12/30/2012 2015   NITRITE POSITIVE (A) 01/31/2018 2048   LEUKOCYTESUR LARGE (A) 01/31/2018 2048   Recent Results (from the past 240  hour(s))  Blood Culture (routine x 2)     Status: None   Collection Time: 01/30/18  4:27 PM  Result Value Ref Range Status   Specimen Description BLOOD LEFT ANTECUBITAL  Final   Special Requests   Final    BOTTLES DRAWN AEROBIC AND ANAEROBIC Blood Culture adequate volume   Culture   Final    NO GROWTH 5 DAYS Performed at [facility], [address], [place], [place] [address]    Report Status 02/04/2018 FINAL  Final  Blood Culture (routine x 2)     Status: None   Collection Time: 01/30/18  4:32 PM  Result Value Ref Range Status   Specimen Description BLOOD RIGHT ANTECUBITAL  Final   Special Requests   Final    BOTTLES DRAWN AEROBIC AND ANAEROBIC Blood Culture adequate volume   Culture   Final    NO GROWTH 5 DAYS Performed at [facility], [address] [place], [place] [address]    Report Status 02/04/2018 FINAL  Final  MRSA PCR Screening     Status: None   Collection Time: 01/31/18 10:50 PM  Result Value Ref  Range Status   MRSA by PCR NEGATIVE NEGATIVE Final    Comment:        The GeneXpert MRSA Assay (FDA approved for NASAL specimens only), is one component of a comprehensive MRSA colonization surveillance program. It is not intended to diagnose MRSA infection nor to guide or monitor treatment for MRSA infections. Performed at [facility], [address] [place], [place] [address]   Culture, Urine     Status: Abnormal   Collection Time: 02/01/18  2:28 PM  Result Value Ref Range Status   Specimen Description URINE, CATHETERIZED  Final   Special Requests   Final    NONE Performed at [facility], [address], [place], [place] [address]    Culture >=100,000 COLONIES/mL PROTEUS MIRABILIS (A)  Final   Report Status 02/03/2018 FINAL  Final   Organism ID, Bacteria PROTEUS MIRABILIS (A)  Final      Susceptibility   Proteus mirabilis - MIC*    AMPICILLIN <=2 SENSITIVE Sensitive     CEFAZOLIN <=4 SENSITIVE Sensitive     CEFTRIAXONE <=1 SENSITIVE Sensitive     CIPROFLOXACIN <=0.25 SENSITIVE Sensitive     GENTAMICIN <=1 SENSITIVE Sensitive     IMIPENEM 2 SENSITIVE Sensitive     NITROFURANTOIN 128 RESISTANT Resistant     TRIMETH/SULFA <=20 SENSITIVE Sensitive     AMPICILLIN/SULBACTAM <=2 SENSITIVE Sensitive     PIP/TAZO <=4 SENSITIVE Sensitive     * >=100,000 COLONIES/mL PROTEUS MIRABILIS  Aerobic/Anaerobic Culture (surgical/deep wound)     Status: None (Preliminary result)   Collection Time: 02/04/18  3:34 PM  Result Value Ref Range Status   Specimen Description BACK  Final   Special Requests DISK SPACE  Final   Gram Stain NO WBC SEEN NO ORGANISMS SEEN   Final   Culture   Final    NO GROWTH 2 DAYS NO ANAEROBES ISOLATED; CULTURE IN PROGRESS FOR 5 DAYS Performed at [facility], [address] [place], [place] [address]    Report Status PENDING  Incomplete  Aerobic/Anaerobic Culture (surgical/deep wound)     Status: None (Preliminary result)   Collection  Time: 02/04/18  3:56 PM  Result Value Ref Range Status   Specimen Description BONE  Final   Special Requests NONE  Final   Gram  Stain NO WBC SEEN NO ORGANISMS SEEN   Final   Culture   Final    NO GROWTH 2 DAYS NO ANAEROBES ISOLATED; CULTURE IN PROGRESS FOR 5 DAYS Performed at [facility], [address] [place], [place] [address]    Report Status PENDING  Incomplete      Radiology Studies: No results found.  Scheduled Meds: . divalproex  125 mg Oral QHS  . docusate sodium  100 mg Oral BID  . escitalopram  15 mg Oral Daily  . ferrous sulfate  325 mg Oral Q breakfast  . finasteride  5 mg Oral QHS  . lactulose  10 g Oral QHS  . lactulose  30 g Oral Daily  . levothyroxine  88 mcg Oral QAC breakfast  . lidocaine  1 patch Transdermal Q24H  . pravastatin  40 mg Oral q1800  . pregabalin  50 mg Oral Daily  . rOPINIRole  0.5 mg Oral QHS  . sodium chloride flush  3 mL Intravenous Q12H   Continuous Infusions: . lactated ringers 10 mL/hr at 02/04/18 0913     LOS: 7 days   Time spent: 25 minutes.  Tyrone Nineyan B Omar Gayden, MD [facility] [email or web address] Password [email or web address] 02/06/2018, 2:48 PM

## 2018-02-06 NOTE — Progress Notes (Signed)
Spoke with RN . No d/c date as of yet.  Will plan on PICC placement 02/07/18.

## 2018-02-06 NOTE — Anesthesia Postprocedure Evaluation (Signed)
Anesthesia Post Note  Patient: [name]  Procedure(s) Performed: Spinal biopsy (N/A )     Patient location during evaluation: PACU Anesthesia Type: General Level of consciousness: awake and alert Pain management: pain level controlled Vital Signs Assessment: post-procedure vital signs reviewed and stable Respiratory status: spontaneous breathing, nonlabored ventilation, respiratory function stable and patient connected to nasal cannula oxygen Cardiovascular status: blood pressure returned to baseline and stable Postop Assessment: no apparent nausea or vomiting Anesthetic complications: no    Last Vitals:  Vitals:   02/06/18 0802 02/06/18 1717  BP: (!) 143/98 (!) 149/91  Pulse: 73 72  Resp:    Temp: 37 C 36.9 C  SpO2: 94% 97%    Last Pain:  Vitals:   02/06/18 1717  TempSrc: Oral  PainSc:                  [name]

## 2018-02-06 NOTE — Progress Notes (Signed)
Patient ID: Charlene BrookeHarda Lein, male   DOB: 03/10/1930, 85 y.o.   MRN: [id number]         [facility]  Date of Admission:  01/30/2018     ASSESSMENT: Epidural abscess cultures remain negative at 48 hours.  I suspect that his worsening vertebral infection is due to MSSA infection that is broken through chronic, suppressive treatment with oral cephalexin.  I will have a PEG placed and start IV cefazolin.  He is anticoagulated so I will avoid rifampin.  PLAN: 1. PEG placement 2. Start cefazolin  Principal Problem:   Epidural abscess Active Problems:   Atrial fibrillation (HCC)   Hypothyroidism   Osteomyelitis of thoracic spine (HCC)   Type 2 diabetes mellitus with complication (HCC)   Other chronic pain   Alzheimer's type dementia (HCC)   Hypotension   Syncope   Pressure injury of skin   Scheduled Meds: . divalproex  125 mg Oral QHS  . docusate sodium  100 mg Oral BID  . escitalopram  15 mg Oral Daily  . ferrous sulfate  325 mg Oral Q breakfast  . finasteride  5 mg Oral QHS  . lactulose  10 g Oral QHS  . lactulose  30 g Oral Daily  . levothyroxine  88 mcg Oral QAC breakfast  . lidocaine  1 patch Transdermal Q24H  . pravastatin  40 mg Oral q1800  . [START ON 02/07/2018] pregabalin  75 mg Oral Daily  . rivaroxaban  20 mg Oral Q supper  . rOPINIRole  0.5 mg Oral QHS  . sodium chloride flush  3 mL Intravenous Q12H   Continuous Infusions: . lactated ringers 10 mL/hr at 02/04/18 0913   PRN Meds:.acetaminophen, albuterol, ALPRAZolam, morphine injection, tiZANidine   SUBJECTIVE: He is having some mid back pain but overall is feeling better.  Review of Systems: Review of Systems  Constitutional: Negative for chills, diaphoresis and fever.  Musculoskeletal: Positive for back pain.    Allergies  Allergen Reactions  . Ace Inhibitors Cough  . Gabapentin Other (See Comments)    Unknown reaction  . Nsaids Other (See Comments)    Renal  insufficiency   . Zolpidem Other (See Comments)    hallucinations Nightmares, irritable, nervous Disorientation    OBJECTIVE: Vitals:   02/05/18 0826 02/05/18 1727 02/06/18 0029 02/06/18 0802  BP: 136/74 (!) 157/99 134/84 (!) 143/98  Pulse: 72 76 86 73  Resp:   20   Temp: 98.7 F (37.1 C) 98.5 F (36.9 C) 98.3 F (36.8 C) 98.6 F (37 C)  TempSrc: Oral Oral Oral Oral  SpO2: 98% 100% 98% 94%  Weight:      Height:       Body mass index is 35.14 kg/m.  Physical Exam Constitutional:      Comments: He is in very good spirits as usual.  He is sitting up in bed watching television.  Psychiatric:        Mood and Affect: Mood normal.     Lab Results Lab Results  Component Value Date   WBC 7.2 02/05/2018   HGB 10.4 (L) 02/05/2018   HCT 33.7 (L) 02/05/2018   MCV 96.0 02/05/2018   PLT 155 02/05/2018    Lab Results  Component Value Date   CREATININE 0.99 02/05/2018   BUN 13 02/05/2018   NA 138 02/05/2018   K 4.4 02/05/2018   CL 107 02/05/2018   CO2 26 02/05/2018    Lab Results  Component Value Date  ALT 9 01/31/2018   AST 15 01/31/2018   ALKPHOS 63 01/31/2018   BILITOT 0.6 01/31/2018    Sed Rate  Date Value  02/24/2017 14 mm/h  12/07/2016 22 mm/h (H)  12/30/2015 10 mm/hr   CRP (mg/L)  Date Value  02/24/2017 1.6  12/07/2016 2.9  12/30/2015 3.2    Microbiology: Recent Results (from the past 240 hour(s))  Blood Culture (routine x 2)     Status: None   Collection Time: 01/30/18  4:27 PM  Result Value Ref Range Status   Specimen Description BLOOD LEFT ANTECUBITAL  Final   Special Requests   Final    BOTTLES DRAWN AEROBIC AND ANAEROBIC Blood Culture adequate volume   Culture   Final    NO GROWTH 5 DAYS Performed at [facility], [address] [place], [place] [address]    Report Status 02/04/2018 FINAL  Final  Blood Culture (routine x 2)     Status: None   Collection Time: 01/30/18  4:32 PM  Result Value Ref Range Status   Specimen  Description BLOOD RIGHT ANTECUBITAL  Final   Special Requests   Final    BOTTLES DRAWN AEROBIC AND ANAEROBIC Blood Culture adequate volume   Culture   Final    NO GROWTH 5 DAYS Performed at [facility], [address] [place], [place] [address]    Report Status 02/04/2018 FINAL  Final  MRSA PCR Screening     Status: None   Collection Time: 01/31/18 10:50 PM  Result Value Ref Range Status   MRSA by PCR NEGATIVE NEGATIVE Final    Comment:        The GeneXpert MRSA Assay (FDA approved for NASAL specimens only), is one component of a comprehensive MRSA colonization surveillance program. It is not intended to diagnose MRSA infection nor to guide or monitor treatment for MRSA infections. Performed at [facility], [address] [place], [place] [address]   Culture, Urine     Status: Abnormal   Collection Time: 02/01/18  2:28 PM  Result Value Ref Range Status   Specimen Description URINE, CATHETERIZED  Final   Special Requests   Final    NONE Performed at [facility], [address] [place], [place] [address]    Culture >=100,000 COLONIES/mL PROTEUS MIRABILIS (A)  Final   Report Status 02/03/2018 FINAL  Final   Organism ID, Bacteria PROTEUS MIRABILIS (A)  Final      Susceptibility   Proteus mirabilis - MIC*    AMPICILLIN <=2 SENSITIVE Sensitive     CEFAZOLIN <=4 SENSITIVE Sensitive     CEFTRIAXONE <=1 SENSITIVE Sensitive     CIPROFLOXACIN <=0.25 SENSITIVE Sensitive     GENTAMICIN <=1 SENSITIVE Sensitive     IMIPENEM 2 SENSITIVE Sensitive     NITROFURANTOIN 128 RESISTANT Resistant     TRIMETH/SULFA <=20 SENSITIVE Sensitive     AMPICILLIN/SULBACTAM <=2 SENSITIVE Sensitive     PIP/TAZO <=4 SENSITIVE Sensitive     * >=100,000 COLONIES/mL PROTEUS MIRABILIS  Aerobic/Anaerobic Culture (surgical/deep wound)     Status: None (Preliminary result)   Collection Time: 02/04/18  3:34 PM  Result Value Ref Range Status   Specimen Description BACK  Final   Special  Requests DISK SPACE  Final   Gram Stain NO WBC SEEN NO ORGANISMS SEEN   Final   Culture   Final    NO GROWTH 2 DAYS NO ANAEROBES ISOLATED; CULTURE IN PROGRESS FOR 5 DAYS  Performed at [facility], [address], [place], [place] [address]    Report Status PENDING  Incomplete  Aerobic/Anaerobic Culture (surgical/deep wound)     Status: None (Preliminary result)   Collection Time: 02/04/18  3:56 PM  Result Value Ref Range Status   Specimen Description BONE  Final   Special Requests NONE  Final   Gram Stain NO WBC SEEN NO ORGANISMS SEEN   Final   Culture   Final    NO GROWTH 2 DAYS NO ANAEROBES ISOLATED; CULTURE IN PROGRESS FOR 5 DAYS Performed at [facility], [address], [place], [place] [address]    Report Status PENDING  Incomplete    Cliffton Asters, MD [facility] [phone] pager   [phone] cell 02/06/2018, 3:32 PM

## 2018-02-06 NOTE — Progress Notes (Signed)
Pharmacy Antibiotic Note  Joshua BrookeHarda [name] is a 85 y.o. male admitted on 01/30/2018. Pharmacy has been consulted for cefazolin dosing for osteomyelitis/discitis and epidural abscess. Pt is afebrile and WBC is WNL. SCr is WNL.   Plan: Cefazolin 2gm IV Q8H F/u renal fxn, C&S, clinical status and LOT F/u further ID recs  Height: 5\' 11"  (180.3 cm) Weight: 251 lb 15.8 oz (114.3 kg) IBW/kg (Calculated) : 75.3  Temp (24hrs), Avg:98.5 F (36.9 C), Min:98.3 F (36.8 C), Max:98.6 F (37 C)  Recent Labs  Lab 01/30/18 1635 01/30/18 1845 01/31/18 1000 01/31/18 1143 02/01/18 0626 02/02/18 0256 02/03/18 0318 02/05/18 0233  WBC  --   --   --  7.5 6.1 6.5 7.0 7.2  CREATININE  --   --  1.14  --  1.07 1.02 1.04 0.99  LATICACIDVEN 0.84 1.22  --   --   --   --   --   --     Estimated Creatinine Clearance: 70.1 mL/min (by C-G formula based on SCr of 0.99 mg/dL).    Allergies  Allergen Reactions  . Ace Inhibitors Cough  . Gabapentin Other (See Comments)    Unknown reaction  . Nsaids Other (See Comments)    Renal insufficiency   . Zolpidem Other (See Comments)    hallucinations Nightmares, irritable, nervous Disorientation    Antimicrobials this admission: Cefazolin 1/12>>  Dose adjustments this admission: N/A  Microbiology results: 1/5 BCx >> ngtd 1/6 MRSA PCR >> neg 1/7 UCx >> 100k proteus (pan-S except R-macrobid) 1/10 bone cx >> GS neg >> ngx2d 1/10 disk space cx >> GS neg >> ngx2d  Thank you for allowing pharmacy to be a part of this patient's care.  [name], [name] 02/06/2018 3:51 PM

## 2018-02-07 ENCOUNTER — Encounter (HOSPITAL_COMMUNITY): Payer: Self-pay | Admitting: Interventional Radiology

## 2018-02-07 ENCOUNTER — Inpatient Hospital Stay (HOSPITAL_COMMUNITY): Payer: Medicare Other

## 2018-02-07 LAB — GLUCOSE, CAPILLARY: Glucose-Capillary: 82 mg/dL (ref 70–99)

## 2018-02-07 MED ORDER — SODIUM CHLORIDE 0.9% FLUSH: 10.0000 mL | INTRAVENOUS | Status: DC | PRN

## 2018-02-07 MED ORDER — SODIUM CHLORIDE 0.9% FLUSH: 10.0000 mL | Freq: Two times a day (BID) | INTRAVENOUS | Status: DC

## 2018-02-07 MED ORDER — CEFAZOLIN IV (FOR PTA / DISCHARGE USE ONLY): 2.0000 g | Freq: Three times a day (TID) | INTRAVENOUS | 0 refills | Status: DC

## 2018-02-07 MED ORDER — PREGABALIN 75 MG PO CAPS: 75.0000 mg | ORAL_CAPSULE | Freq: Every day | ORAL | 0 refills | Status: DC

## 2018-02-07 NOTE — Progress Notes (Signed)
Peripherally Inserted Central Catheter/Midline Placement  The IV Nurse has discussed with the patient and/or persons authorized to consent for the patient, the purpose of this procedure and the potential benefits and risks involved with this procedure.  The benefits include less needle sticks, lab draws from the catheter, and the patient may be discharged home with the catheter. Risks include, but not limited to, infection, bleeding, blood clot (thrombus formation), and puncture of an artery; nerve damage and irregular heartbeat and possibility to perform a PICC exchange if needed/ordered by physician.  Alternatives to this procedure were also discussed.  Bard Power PICC patient education guide, fact sheet on infection prevention and patient information card has been provided to patient /or left at bedside.    PICC/Midline Placement Documentation  PICC Single Lumen 02/07/18 PICC Right Cephalic 39 cm 0 cm (Active)  Indication for Insertion or Continuance of Line Prolonged intravenous therapies 02/07/2018 12:18 PM  Exposed Catheter (cm) 0 cm 02/07/2018 12:18 PM  Site Assessment Clean;Dry;Intact 02/07/2018 12:18 PM  Line Status Flushed;Blood return noted 02/07/2018 12:18 PM  Dressing Type Transparent 02/07/2018 12:18 PM  Dressing Status Clean;Dry;Intact;Antimicrobial disc in place 02/07/2018 12:18 PM  Dressing Intervention New dressing 02/07/2018 12:18 PM  Dressing Change Due 02/14/18 02/07/2018 12:18 PM    Telephone consent signed by wife   Maximino Greenland 02/07/2018, 12:20 PM

## 2018-02-07 NOTE — Discharge Summary (Signed)
Physician Discharge Summary  [name] OEH:[id number] DOB: 03/16/1930 DOA: 01/30/2018  PCP: Lilian Coma., MD  Admit date: 01/30/2018 Discharge date: 02/07/2018  Admitted From: SNF Disposition: SNF   Recommendations for Outpatient Follow-up:  1. Follow up with PCP in 1-2 weeks 2. Follow up with ID, Dr. Tommy Medal in 4 weeks 3. Continue IV ancef as below for 6 weeks 4. Follow up microbiology data from disc aspirate and bone biopsy on 1/10 (NGTD at discharge)  Home Health: N/A Equipment/Devices: Per SNF Discharge Condition: Stable CODE STATUS: Full Diet recommendation: Heart health/carb-modified  Brief/Interim Summary: [name] is an 85 y.o. 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 Patient has apparently been having these episodes of confusion on and off for a while. Today however he had syncopal episode. When EMS arrived blood pressure was 72/40. He received up to 700 cc bolus of normal saline and brought to the ER where he remained hypotensive with systolic in the 03B. He received couple of liters of IV fluid and systolic is up to 048 at the moment. Patient has reportedly been nauseated in the last couple of days has not been eating or drinking adequately. No diarrhea no vomiting. He has some chest discomfort with this. But throughout the week his confusion has progressively worsened. Patient is therefore being diagnosed with syncopal episode hypotension and possible arrhythmia. He is still in atrial fibrillation but the rate is controlled and he is fully anticoagulated.  ED Course:Initial blood pressure was 85/61 in the ER with a temperature 97.5 pulse 107 respiratory 21 oxygen sat 79% on room air he is currently  100% on 2 L. White count is 4.0 hemoglobin 7.9 and platelets are clumped. He has sodium 141 potassium 3.0 chloride 118 and CO2 20 BUN 20 creatinine 1.1 and calcium 6.5 with troponin 0 0.03. Lactic acid was 1.22 fecal occult blood was negative x2.Head CT without contrast is negative and chest x-ray showed no significant findings.  Hospital Course: MRI brain showed no acute stroke. Blood pressure has improved with fluids. No perfusion-limiting arrhythmias on telemetry. Due to history including subacute onset of left lower extremity paresis in addition to RLE as well as bowel and bladder incontinence, MRI of the thoracic and lumbar spine was performed 01/31/2018 which demonstrated osteomyelitis/discitis at T8-9 and T9-10 with associated epidural phlegmon/abscess extending from T7-8 through T11-12, with severe spinal stenosis at the level of T9-10. Associated cord signal abnormality at the level of T9-10 compatible with edema and/or myelomalacia. Also noted possible mild/early discitis at L4-5 and L5-S1 without appreciable epidural collection or other abnormality. Sequelae of chronic discitis osteomyelitis at T12-L1 without evidence for significant or residual or recurrent infection. Small focus of chronic myelomalacia within the distal cord at this level.  Neurosurgery was consulted, felt that the patient's potential benefit from surgery would be marginal with months of symptoms and preexisting debility which would limit rehabilitation efforts, and risks are prohibitive. IR performed aspiration of T9-T10 disc space and bone biopsy T9 on 1/10. Per ID consultant, PICC line is placed 1/13 with plans for 6 weeks of ancef for presumed breakthrough MSSA infection despite no culture growth to date.  Discharge Diagnoses:  Principal Problem:   Epidural abscess Active Problems:   Atrial fibrillation (HCC)   Hypothyroidism   Osteomyelitis  of thoracic spine ([place])   Type 2 diabetes mellitus with complication  (HCC)   Other chronic pain   Alzheimer's type dementia ([place])   Hypotension   Syncope   Pressure injury of skin   Hypotension: Possible orthostatic/hypotensive syncope. While the patient has an infection, there are no other indications of SIRS to suggest that hypotension was attributable solely to infection. Pt at very high risk for arrhythmia however. Cortisol sufficient. Echocardiogram without low CO.  - Monitor, improved.  Osteomyelitis/discitis at T8-9 and T9-10 with epidural abscess T7-T12: s/p disc aspiration T9-10 and bone biopsy T9. On imaging, also noted possible mild/early discitis at L4-5 and L5-S1 without appreciable epidural collection or other abnormality. - Cultures no growth to date at time of discharge. Due to h/o MSSA infection, this will be empirically targeted with 6 weeks of ancef, will need ID follow up in 4 weeks.  Severe spinal stenosis at the level of T9-10 with associated cord edema and/or myelomalacia. Also with myelomalacia distally associated with prior osteo/discitis.  - Dr. Zada Finders, neurosurgery, evaluated the patient 1/7 recommending against surgery.  - Continue PT/OT. Pt and family relieved by certainty of diagnosis despite its minimal chance for recovery. - Associated spasms to be treated with tizanidine, ropinirole, iron supplementation in the event RLS is contributing. Has unknown reaction listed as allergy to gabapentin. Tolerating lyrica, dose titrated to 55m with good tolerance. Feels spasms less frequent overall. Indication: MSSA epidural abscess and discitis/osteonyelitis Regimen: Cefazolin 2g IV q8h End date: 03/20/2018  History of discitis, spinal (thoracolumbar) osteomyelitis and recurrent MSSA infection including right hip: MRI demonstrated sequelae of chronic discitis osteomyelitis at T12-L1 without evidence for significant or residual or recurrent infection of the hip.  - Tx as above.  Nitrite-positive pyuria: Unclear significance. - Urine  culture growing >100k Proteus. Recommendation to monitor off abx per ID, though this was shown to be sensitive to ancef.    Anemia of chronic disease and iron deficiency anemia: Reportedly has needed transfusions in the past for this, no bleeding, FOBT negative x2. Initial hemoglobin possibly spuriously low at 7.9, up to 10.8 and stable around 10.4 since. - Continue iron supplementation  CKD stage II: CrCl remains >690mmin currently, so may actually be stage II.  - Taking PO well, CrCl 7043min. - Avoid nephrotoxins as able  T2DM with hypoglycemia: - DC'ed SSI, pioglitazone with euglycemia.  - Can consider discontinuing pioglitazone based on CBGs as outpatient.  Hypothyroidism: TSH 1.613.  - Continue synthroid  Atrial fibrillation: Rate controlled.  - Resumed xarelto post procedure.  Hypokalemia: Resolved with replacement. - Monitor intermittently  Alzheimer's dementia: Still intermittently able to share decision making. - Mental status generally best during the mornings. - Not on medications at home.  - Stopped xanax per discontinuation on continuity of care documentation PTA.  HLD:  - Continue statin  Morbid obesity: BMI 34 - Weight loss recommended  Right heel blister: Uncertain whether POA and uncertain mechanism of injury. - Offload, local wound care recommended.  Discharge Instructions Discharge Instructions    Diet - low sodium heart healthy   Complete by:  As directed    Diet Carb Modified   Complete by:  As directed    Home infusion instructions [facility]; May administer Cathflo as needed to maintain patency of vascular access device.; Fl[facility] vascular access [facility]: 0.9% NaCl pre/post medica...   Complete by:  As directed    Instructions:  May follow  [facility] Dosing Protocol   Instructions:  May administer Cathflo as needed to maintain patency of vascular access device.    Instructions:  Flushing of vascular access device: per [facility] Protocol: 0.9% NaCl pre/post medication administration and prn patency; Heparin 100 u/ml, 63m for implanted ports and Heparin 10u/ml, 59mfor all other central venous catheters.   Instructions:  May follow [facility] Anaphylaxis Protocol for First Dose Administration in the home: 0.9% NaCl at 25-50 ml/hr to maintain IV access for protocol meds. Epinephrine 0.3 ml IV/IM PRN and Benadryl 25-50 IV/IM PRN s/s of anaphylaxis.   Instructions:  Ad[facility]fusion Coordinator (RN) to assist per patient IV care needs in the home PRN.   Increase activity slowly   Complete by:  As directed      Allergies as of 02/07/2018      Reactions   Ace Inhibitors Cough   Gabapentin Other (See Comments)   Unknown reaction   Nsaids Other (See Comments)   Renal insufficiency   Zolpidem Other (See Comments)   hallucinations Nightmares, irritable, nervous Disorientation      Medication List    STOP taking these medications   ALPRAZolam 0.25 MG tablet Commonly known as:  XANAX     TAKE these medications   acetaminophen 500 MG tablet Commonly known as:  TYLENOL Take 500 mg by mouth 3 (three) times daily.   ASPERCREME LIDOCAINE 4 % Ptch Generic drug:  Lidocaine Apply 3 patches topically See admin instructions. Apply one patch to left arm between shoulder and elbow every morning, remove every night (12 hrs later); apply 2nd patch between shoulder blades every morning, remove every night (12 hrs later); apply 3rd patch to right arm between shoulder and elbow every morning, remover every night (12 hrs later)   BIOTENE MOISTURIZING MOUTH MT Use as directed 2 sprays in the mouth or throat See admin instructions. Apply 2 sprays directly onto mouth and tongue. Spread thoroughly inside the mouth - every 6 hours as needed for dry mouth.   ceFAZolin  IVPB Commonly known as:  ANCEF Inject 2 g into the vein every 8 (eight) hours. Indication:  MSSA epidural  abscess and discitis/osteomyelitis Last Day of Therapy: 03/20/2018 Labs - Once weekly:  CBC/D and BMP, Labs - Every other week:  ESR and CRP   divalproex 125 MG capsule Commonly known as:  DEPAKOTE SPRINKLE Take 125 mg by mouth at bedtime.   docusate sodium 100 MG capsule Commonly known as:  COLACE Take 100 mg by mouth 2 (two) times daily.   escitalopram 10 MG tablet Commonly known as:  LEXAPRO Take 10 mg by mouth daily.   ferrous sulfate 325 (65 FE) MG tablet Take 325 mg by mouth 2 (two) times daily with a meal.   finasteride 5 MG tablet Commonly known as:  PROSCAR Take 5 mg by mouth at bedtime.   lactulose 10 GM/15ML solution Commonly known as:  CHRONULAC Take 10-30 g by mouth See admin instructions. Take 45 mls (30 g) by mouth every morning, take 15 mls (10 g) at bedtime. Hold for loose stool.   levothyroxine 88 MCG tablet Commonly known as:  SYNTHROID, LEVOTHROID Take 88 mcg by mouth daily before breakfast.   lovastatin 40 MG tablet Commonly known as:  MEVACOR Take 40 mg by mouth at bedtime.   nystatin powder Generic drug:  nystatin Apply topically See admin instructions. Apply powder to skin fold rash twice daily until resolved   pioglitazone 15 MG tablet Commonly known  as:  ACTOS Take 15 mg by mouth daily.   pregabalin 75 MG capsule Commonly known as:  LYRICA Take 1 capsule (75 mg total) by mouth daily. Start taking on:  February 08, 2018   rivaroxaban 20 MG Tabs tablet Commonly known as:  XARELTO Take 20 mg by mouth daily.   rOPINIRole 0.5 MG tablet Commonly known as:  REQUIP Take 0.5 mg by mouth at bedtime.   telmisartan 40 MG tablet Commonly known as:  MICARDIS Take 40 mg by mouth daily.   terazosin 5 MG capsule Commonly known as:  HYTRIN Take 5 mg by mouth at bedtime.   tiZANidine 2 MG tablet Commonly known as:  ZANAFLEX Take 2 mg by mouth 3 (three) times daily.   torsemide 10 MG tablet Commonly known as:  DEMADEX Take 10 mg by mouth  daily.   vitamin C 500 MG tablet Commonly known as:  ASCORBIC ACID Take 500 mg by mouth 2 (two) times daily.   WELLBUTRIN SR 100 MG 12 hr tablet Generic drug:  buPROPion Take 100 mg by mouth 2 (two) times daily.            Home Infusion Instuctions  (From admission, onward)         Start     Ordered   02/07/18 0000  Home infusion instructions [facility] May follow [facility] Dosing Protocol; May administer Cathflo as needed to maintain patency of vascular access device.; Flushing of vascular access device: per [facility] Protocol: 0.9% NaCl pre/post medica...    Question Answer Comment  Instructions May follow [place] Dosing Protocol   Instructions May administer Cathflo as needed to maintain patency of vascular access device.   Instructions Flushing of vascular access device: per [facility] Protocol: 0.9% NaCl pre/post medication administration and prn patency; Heparin 100 u/ml, 76m for implanted ports and Heparin 10u/ml, 575mfor all other central venous catheters.   Instructions May follow [facility] Anaphylaxis Protocol for First Dose Administration in the home: 0.9% NaCl at 25-50 ml/hr to maintain IV access for protocol meds. Epinephrine 0.3 ml IV/IM PRN and Benadryl 25-50 IV/IM PRN s/s of anaphylaxis.   Instructions [facility] Infusion Coordinator (RN) to assist per patient IV care needs in the home PRN.      02/07/18 1104          Contact information for follow-up providers    Jobe, DaAlexis Goodell MD Follow up.   Specialty:  Internal Medicine Contact information: [address] [phone]        [place], CoLavell IslamMD. Schedule an appointment as soon as possible for a visit in 4 week(s).   Specialty:  Infectious Diseases Contact information: [phone]            Contact information for after-discharge care    Destination    [facility] .   Service:  Skilled  Nursing Contact information: [phone]               Allergies  Allergen Reactions  . Ace Inhibitors Cough  . Gabapentin Other (See Comments)    Unknown reaction  . Nsaids Other (See Comments)    Renal insufficiency   . Zolpidem Other (See Comments)    hallucinations Nightmares, irritable, nervous Disorientation    Consultations:  IR  ID  Procedures/Studies: Ct Head Wo Contrast  Result Date: 01/30/2018  CLINICAL DATA:  Chest heaviness and altered mental status today. EXAM: CT HEAD WITHOUT CONTRAST TECHNIQUE: Contiguous axial images were obtained from the base of the skull through the vertex without intravenous contrast. COMPARISON:  Head CT 11/03/2017.  Brain MRI 11/04/2017. FINDINGS: Brain: No evidence of acute infarction, hemorrhage, hydrocephalus, extra-axial collection or mass lesion/mass effect. Atrophy and chronic microvascular ischemic change noted. Vascular: Atherosclerosis is seen. Skull: Intact.  No focal lesion. Sinuses/Orbits: Scattered ethmoid air cell disease and mild mucosal thickening in the maxillary sinuses noted. The patient is status post cataract surgery. Other: None. IMPRESSION: No acute abnormality. Atrophy and chronic microvascular ischemic change. Mild maxillary sinus and ethmoid air cell disease. Atherosclerosis. Electronically Signed   By: Inge Rise M.D.   On: 01/30/2018 17:29   Mr Brain Wo Contrast  Result Date: 01/31/2018 CLINICAL DATA:  Sudden onset of confusion and loss of consciousness. EXAM: MRI HEAD WITHOUT CONTRAST TECHNIQUE: Multiplanar, multiecho pulse sequences of the brain and surrounding structures were obtained without intravenous contrast. COMPARISON:  Head CT 01/30/2018.  MRI 11/04/2017. FINDINGS: Brain: Diffusion imaging does not show any acute or subacute infarction. There is generalized atrophy. No focal insult affects the brainstem or cerebellum. There is an  incidental venous angioma in the left inferior pons. There are mild chronic small-vessel ischemic changes of the cerebral hemispheric white matter. No cortical or large vessel territory infarction. No mass lesion, hemorrhage, hydrocephalus or extra-axial collection. Vascular: Major vessels at the base of the brain show flow. Skull and upper cervical spine: Negative Sinuses/Orbits: Clear/normal Other: None IMPRESSION: No acute finding. Atrophy and chronic small-vessel ischemic changes as outlined above. Electronically Signed   By: Nelson Chimes M.D.   On: 01/31/2018 09:10   Mr Lumbar Spine Wo Contrast  Addendum Date: 02/01/2018   ADDENDUM REPORT: 02/01/2018 07:24 ADDENDUM: Findings were communicated by telephone to the nurse practitioner taking care of the patient Clance Boll at 12:43 a.m. on 02/01/2017. Electronically Signed   By: Jeannine Boga M.D.   On: 02/01/2018 07:24   Result Date: 02/01/2018 CLINICAL DATA:  85 year old male with history of osteomyelitis discitis, status post thoracic fusion, now with new lower extremity weakness. EXAM: MRI THORACIC AND LUMBAR SPINE WITHOUT AND WITH CONTRAST TECHNIQUE: Multiplanar and multiecho pulse sequences of the thoracic and lumbar spine were obtained without and with intravenous contrast. CONTRAST:  10 cc of Gadavist. COMPARISON:  Comparison made with prior MRI from 01/10/2015. FINDINGS: MRI THORACIC SPINE FINDINGS Alignment: Limited sagittal imaging of the cervical spine grossly unremarkable other than for degenerative spondylolysis and facet arthrosis. No significant stenosis. Stable thoracic alignment with mild straightening of the normal thoracic kyphosis. Trace chronic anterolisthesis of T12 on L1, stable from previous. Vertebrae: Similar bulky osteophytic spurring or with suspected ankylosis again seen throughout the thoracic spine. Abnormal fluid density within the T9-T10 interspace with adjacent endplate edema and enhancement, concerning for  osteomyelitis discitis. There has been mildly progressive height loss at the anterior inferior T9 vertebral body, with progressive height loss at the superior endplate of Y69 as compared to previous exam. Pocket of fluid and/or disc material seen eroding into the mid aspect of the T9 vertebral body (series 18, image 14). Findings compatible with acute osteomyelitis discitis. Edema and enhancement extends into the bilateral T9-10 facets, likely reflecting associated septic arthritis. There is additional abnormal fluid signal intensity with enhancement within the T8-9 interspace, consistent with osteomyelitis discitis as well. Vertebral body heights relatively maintained at this level without significant height loss. Changes compatible with chronic  osteomyelitis seen about the T12-L1 level without evidence for significant active infection at this time. There has been interval loss of disc height with partial ankylosis of the T12 and L1 vertebral body since previous exam. Fluid signal intensity without associated endplate edema or enhancement noted at the C7-T1 space, favored to be degenerative in nature (series 18, image 15). Underlying bone marrow signal intensity mildly heterogeneous but within normal limits. No worrisome osseous lesions. Cord: Abnormal cord signal abnormality seen within the thoracic spinal cord extending from T8-9 through T10-11, concerning for edema and/or myelomalacia due to compressive stenosis. Small focus of chronic myelomalacia noted at the T12-L1 level as well (series 17, image 10). Signal intensity within the thoracic spinal cord otherwise within normal limits. Paraspinal and other soft tissues: Paraspinous edema and phlegmon seen about the T8-9 and T9-10 interspaces. No discrete paraspinous or soft tissue abscess identified. Small layering bilateral pleural effusions noted. Dolichoectatic intrathoracic aorta with associated atherosclerotic change. Disc levels: Abnormal epidural  enhancement seen extending from the T7-8 level through the T11-12 levels related to T8-9 and T9-10 osteomyelitis discitis (series 32, image 7). There is circumferential epidural phlegmon/abscess at the T8-9 and T9-10 levels with resultant up to severe spinal stenosis. This is most pronounced at the level of T9 where the thecal sac is compressed measuring 6 mm in transverse diameter (series 20, image 26). Associated edema and/or myelomalacia as above. Partial ankylosis at T12-L1 with trace anterolisthesis. Resultant circumferential disc osteophyte mildly flattens and indents the ventral thecal sac. Moderate facet hypertrophy at this level with resultant mild spinal stenosis. Patient status post prior posterior decompression at this level. No other significant stenosis within the thoracic spine. MRI LUMBAR SPINE FINDINGS Segmentation: Transitional lumbosacral anatomy with partial lumbarization of the S1 segment. Same numbering system employed as on previous exams. Alignment: Straightening with reversal of the normal lordosis at the thoracolumbar junction, stable. Trace anterolisthesis of T12 on L1, stable and chronic. Vertebrae: Bulky endplate osteophytic spurring with ankylosis again seen throughout the lumbar spine with multilevel ankylosis, most notable at L2 through L4. Prior posterior decompression at the T12-L1 level. Bone marrow signal intensity heterogeneous without worrisome osseous lesions. Changes compatible with chronic/burned-out osteomyelitis at the T12-L1 level with ankylosis. Small amount of fluid signal seen within the anterolateral aspect of the L4-5 disc space without associated endplate edema. Fluid signal also seen within the L5-S1 disc space with small amount of reactive endplate edema, most notable on the left (series 28, image 11). Abnormal marrow edema present about the left L5-S1 facet (series 28, image 11). Conus medullaris: Extends to the L1-2. Small focus of chronic myelomalacia at the  level of T12-L1 (series 27, image 7). Conus medullaris otherwise within normal limits. Paraspinal and other soft tissues: Chronic fatty atrophy noted within the posterior paraspinous soft tissues as well as the right psoas muscle. No paraspinous collection. Chronic bilateral renal atrophy partially visualized. Atherosclerotic change noted within the intra-abdominal aorta. Disc levels: L1-2: Seen only on sagittal projection. Diffuse disc bulge with disc desiccation and intervertebral disc space narrowing. Bilateral facet hypertrophy. Mild canal narrowing without compressive stenosis. Foramina remain patent. L2-3: Ankylosis of the L2 and L3 disc space. Posterior osteophytic ridging flattens and indents the ventral thecal sac, greater on the right. Mild facet ligament flavum hypertrophy. Resultant mild canal with right lateral recess stenosis. Foramina remain patent. L3-4: Chronic ankylosis with loss of the L3-4 disc space. Posterior osseous ridging mildly flattens the ventral thecal sac. Moderate bilateral facet hypertrophy. No significant canal stenosis.  Mild bilateral foraminal narrowing. L4-5: Chronic intervertebral disc space narrowing with partial ankylosis of the L4 and L5 vertebral bodies. Moderate facet hypertrophy. Prior posterior decompression. Residual mild canal with bilateral lateral recess narrowing. Mild left with mild to moderate right L4 foraminal stenosis. L5-S1: Chronic intervertebral disc space narrowing with diffuse disc bulge and disc desiccation. Reactive endplate osteophytic spurring. Severe left with mild right facet hypertrophy. Ligamentum flavum thickening. Prior posterior decompression. Persistent severe canal with bilateral lateral recess stenosis. Moderate right with severe left L5 foraminal narrowing. IMPRESSION: 1. Findings consistent with osteomyelitis discitis at T8-9 and T9-10. Associated epidural phlegmon/abscess extending from T7-8 through T11-12, with severe spinal stenosis at the  level of T9-10. Associated cord signal abnormality at the level of T9-10 compatible with edema and/or myelomalacia. Neuro surgical consultation recommended. 2. Probable associated facet arthritis at T9-10. 3. Possible mild/early discitis at L4-5 and L5-S1 without appreciable epidural collection or other abnormality. Edema about the left L5-S1 facet favored to be related to facet arthritis. 4. Sequelae of chronic discitis osteomyelitis at T12-L1 without evidence for significant or residual or recurrent infection. Small focus of chronic myelomalacia within the distal cord at this level. 5. Multifactorial degenerative changes at L5-S1 with resultant severe canal, with moderate to severe left greater than right foraminal stenosis. 6. Fluid signal intensity within the C7-T1 disc without appreciable enhancement or endplate changes, favored to be degenerative. Attention at follow-up recommended. 7. Small layering bilateral pleural effusions. Current attempt being made to contact the ordering and/or covering physician. Results will be communicated as soon as possible. Electronically Signed: By: Jeannine Boga M.D. On: 02/01/2018 00:33   Mr Thoracic Spine W Wo Contrast  Addendum Date: 02/01/2018   ADDENDUM REPORT: 02/01/2018 07:24 ADDENDUM: Findings were communicated by telephone to the nurse practitioner taking care of the patient Clance Boll at 12:43 a.m. on 02/01/2017. Electronically Signed   By: Jeannine Boga M.D.   On: 02/01/2018 07:24   Result Date: 02/01/2018 CLINICAL DATA:  85 year old male with history of osteomyelitis discitis, status post thoracic fusion, now with new lower extremity weakness. EXAM: MRI THORACIC AND LUMBAR SPINE WITHOUT AND WITH CONTRAST TECHNIQUE: Multiplanar and multiecho pulse sequences of the thoracic and lumbar spine were obtained without and with intravenous contrast. CONTRAST:  10 cc of Gadavist. COMPARISON:  Comparison made with prior MRI from 01/10/2015. FINDINGS:  MRI THORACIC SPINE FINDINGS Alignment: Limited sagittal imaging of the cervical spine grossly unremarkable other than for degenerative spondylolysis and facet arthrosis. No significant stenosis. Stable thoracic alignment with mild straightening of the normal thoracic kyphosis. Trace chronic anterolisthesis of T12 on L1, stable from previous. Vertebrae: Similar bulky osteophytic spurring or with suspected ankylosis again seen throughout the thoracic spine. Abnormal fluid density within the T9-T10 interspace with adjacent endplate edema and enhancement, concerning for osteomyelitis discitis. There has been mildly progressive height loss at the anterior inferior T9 vertebral body, with progressive height loss at the superior endplate of J47 as compared to previous exam. Pocket of fluid and/or disc material seen eroding into the mid aspect of the T9 vertebral body (series 18, image 14). Findings compatible with acute osteomyelitis discitis. Edema and enhancement extends into the bilateral T9-10 facets, likely reflecting associated septic arthritis. There is additional abnormal fluid signal intensity with enhancement within the T8-9 interspace, consistent with osteomyelitis discitis as well. Vertebral body heights relatively maintained at this level without significant height loss. Changes compatible with chronic osteomyelitis seen about the T12-L1 level without evidence for significant active infection at this  time. There has been interval loss of disc height with partial ankylosis of the T12 and L1 vertebral body since previous exam. Fluid signal intensity without associated endplate edema or enhancement noted at the C7-T1 space, favored to be degenerative in nature (series 18, image 15). Underlying bone marrow signal intensity mildly heterogeneous but within normal limits. No worrisome osseous lesions. Cord: Abnormal cord signal abnormality seen within the thoracic spinal cord extending from T8-9 through T10-11,  concerning for edema and/or myelomalacia due to compressive stenosis. Small focus of chronic myelomalacia noted at the T12-L1 level as well (series 17, image 10). Signal intensity within the thoracic spinal cord otherwise within normal limits. Paraspinal and other soft tissues: Paraspinous edema and phlegmon seen about the T8-9 and T9-10 interspaces. No discrete paraspinous or soft tissue abscess identified. Small layering bilateral pleural effusions noted. Dolichoectatic intrathoracic aorta with associated atherosclerotic change. Disc levels: Abnormal epidural enhancement seen extending from the T7-8 level through the T11-12 levels related to T8-9 and T9-10 osteomyelitis discitis (series 32, image 7). There is circumferential epidural phlegmon/abscess at the T8-9 and T9-10 levels with resultant up to severe spinal stenosis. This is most pronounced at the level of T9 where the thecal sac is compressed measuring 6 mm in transverse diameter (series 20, image 26). Associated edema and/or myelomalacia as above. Partial ankylosis at T12-L1 with trace anterolisthesis. Resultant circumferential disc osteophyte mildly flattens and indents the ventral thecal sac. Moderate facet hypertrophy at this level with resultant mild spinal stenosis. Patient status post prior posterior decompression at this level. No other significant stenosis within the thoracic spine. MRI LUMBAR SPINE FINDINGS Segmentation: Transitional lumbosacral anatomy with partial lumbarization of the S1 segment. Same numbering system employed as on previous exams. Alignment: Straightening with reversal of the normal lordosis at the thoracolumbar junction, stable. Trace anterolisthesis of T12 on L1, stable and chronic. Vertebrae: Bulky endplate osteophytic spurring with ankylosis again seen throughout the lumbar spine with multilevel ankylosis, most notable at L2 through L4. Prior posterior decompression at the T12-L1 level. Bone marrow signal intensity  heterogeneous without worrisome osseous lesions. Changes compatible with chronic/burned-out osteomyelitis at the T12-L1 level with ankylosis. Small amount of fluid signal seen within the anterolateral aspect of the L4-5 disc space without associated endplate edema. Fluid signal also seen within the L5-S1 disc space with small amount of reactive endplate edema, most notable on the left (series 28, image 11). Abnormal marrow edema present about the left L5-S1 facet (series 28, image 11). Conus medullaris: Extends to the L1-2. Small focus of chronic myelomalacia at the level of T12-L1 (series 27, image 7). Conus medullaris otherwise within normal limits. Paraspinal and other soft tissues: Chronic fatty atrophy noted within the posterior paraspinous soft tissues as well as the right psoas muscle. No paraspinous collection. Chronic bilateral renal atrophy partially visualized. Atherosclerotic change noted within the intra-abdominal aorta. Disc levels: L1-2: Seen only on sagittal projection. Diffuse disc bulge with disc desiccation and intervertebral disc space narrowing. Bilateral facet hypertrophy. Mild canal narrowing without compressive stenosis. Foramina remain patent. L2-3: Ankylosis of the L2 and L3 disc space. Posterior osteophytic ridging flattens and indents the ventral thecal sac, greater on the right. Mild facet ligament flavum hypertrophy. Resultant mild canal with right lateral recess stenosis. Foramina remain patent. L3-4: Chronic ankylosis with loss of the L3-4 disc space. Posterior osseous ridging mildly flattens the ventral thecal sac. Moderate bilateral facet hypertrophy. No significant canal stenosis. Mild bilateral foraminal narrowing. L4-5: Chronic intervertebral disc space narrowing with partial ankylosis of  the L4 and L5 vertebral bodies. Moderate facet hypertrophy. Prior posterior decompression. Residual mild canal with bilateral lateral recess narrowing. Mild left with mild to moderate right L4  foraminal stenosis. L5-S1: Chronic intervertebral disc space narrowing with diffuse disc bulge and disc desiccation. Reactive endplate osteophytic spurring. Severe left with mild right facet hypertrophy. Ligamentum flavum thickening. Prior posterior decompression. Persistent severe canal with bilateral lateral recess stenosis. Moderate right with severe left L5 foraminal narrowing. IMPRESSION: 1. Findings consistent with osteomyelitis discitis at T8-9 and T9-10. Associated epidural phlegmon/abscess extending from T7-8 through T11-12, with severe spinal stenosis at the level of T9-10. Associated cord signal abnormality at the level of T9-10 compatible with edema and/or myelomalacia. Neuro surgical consultation recommended. 2. Probable associated facet arthritis at T9-10. 3. Possible mild/early discitis at L4-5 and L5-S1 without appreciable epidural collection or other abnormality. Edema about the left L5-S1 facet favored to be related to facet arthritis. 4. Sequelae of chronic discitis osteomyelitis at T12-L1 without evidence for significant or residual or recurrent infection. Small focus of chronic myelomalacia within the distal cord at this level. 5. Multifactorial degenerative changes at L5-S1 with resultant severe canal, with moderate to severe left greater than right foraminal stenosis. 6. Fluid signal intensity within the C7-T1 disc without appreciable enhancement or endplate changes, favored to be degenerative. Attention at follow-up recommended. 7. Small layering bilateral pleural effusions. Current attempt being made to contact the ordering and/or covering physician. Results will be communicated as soon as possible. Electronically Signed: By: Jeannine Boga M.D. On: 02/01/2018 00:33   Ir Fluoro Guide Ndl Plmt / Bx  Result Date: 02/07/2018 INDICATION: Progressive osteomyelitis at T8-T9 and T9-T10. EXAM: FLUORO GUIDED DISC ASPIRATION AT T9-T10, AND BONE BIOPSY AT T9 MEDICATIONS: None.  ANESTHESIA/SEDATION: General endotracheal anesthesia. FLUOROSCOPY TIME:  Fluoroscopy Time: 9 minutes 18 seconds (244 mGy). COMPLICATIONS: None immediate. PROCEDURE: Informed written consent was obtained from the patient after a thorough discussion of the procedural risks, benefits and alternatives. All questions were addressed. Maximal Sterile Barrier Technique was utilized including caps, mask, sterile gowns, sterile gloves, sterile drape, hand hygiene and skin antiseptic. A timeout was performed prior to the initiation of the procedure. Following endotracheal intubation, the patient was laid prone on the fluoroscopic table. The skin overlying the lower thoracic region was then prepped and draped in the usual sterile fashion. The skin overlying the T9-T10 disc space was then infiltrated with 0.25% bupivacaine followed by the advancement of a 21 gauge Franseen needle into the disc space under bilateral intermittent fluoroscopy. Access was obtained into the disc space with crossing of the midline. Using a 20 mL syringe, approximately 4.5 cc of fluid was obtained and sent for microbiologic analysis as per the request of the referring physician. Hemostasis was achieved at the skin entry site. Also the skin overlying the right pedicle at T9 was then infiltrated with 0.25% bupivacaine and carried to the right pedicle. Using biplane intermittent fluoroscopy, a 15 cm and 13 gauge Cook spinal needle was then advanced without difficulty into the mid 1/3 at T9. Using an 18 gauge core biopsy needle, 2 passes were made into the anterior 1/3 at T9. Mixed blood-stained liquid and solid material was obtained in the biopsies. Samples were again sent for microbiologic analysis. Hemostasis was achieved at the skin entry site. Patient's general anesthesia was reversed and the patient was extubated without difficulty. Neurologically the patient remained stable unchanged from prior to the beginning of the procedure. He was then sent to  recovery in stable  condition. IMPRESSION: 1. Status post fluoroscopic guided disc aspiration at T9-T10 as described. 2. Status post fluoroscopic guided needle placement into the T9 vertebral body with core biopsy and aspirate obtained for analysis as described. Electronically Signed   By: Luanne Bras M.D.   On: 02/04/2018 17:21   Ir Fluoro Guide Ndl Plmt / Bx  Result Date: 02/07/2018 INDICATION: Progressive osteomyelitis at T8-T9 and T9-T10. EXAM: FLUORO GUIDED DISC ASPIRATION AT T9-T10, AND BONE BIOPSY AT T9 MEDICATIONS: None. ANESTHESIA/SEDATION: General endotracheal anesthesia. FLUOROSCOPY TIME:  Fluoroscopy Time: 9 minutes 18 seconds (244 mGy). COMPLICATIONS: None immediate. PROCEDURE: Informed written consent was obtained from the patient after a thorough discussion of the procedural risks, benefits and alternatives. All questions were addressed. Maximal Sterile Barrier Technique was utilized including caps, mask, sterile gowns, sterile gloves, sterile drape, hand hygiene and skin antiseptic. A timeout was performed prior to the initiation of the procedure. Following endotracheal intubation, the patient was laid prone on the fluoroscopic table. The skin overlying the lower thoracic region was then prepped and draped in the usual sterile fashion. The skin overlying the T9-T10 disc space was then infiltrated with 0.25% bupivacaine followed by the advancement of a 21 gauge Franseen needle into the disc space under bilateral intermittent fluoroscopy. Access was obtained into the disc space with crossing of the midline. Using a 20 mL syringe, approximately 4.5 cc of fluid was obtained and sent for microbiologic analysis as per the request of the referring physician. Hemostasis was achieved at the skin entry site. Also the skin overlying the right pedicle at T9 was then infiltrated with 0.25% bupivacaine and carried to the right pedicle. Using biplane intermittent fluoroscopy, a 15 cm and 13 gauge Cook  spinal needle was then advanced without difficulty into the mid 1/3 at T9. Using an 18 gauge core biopsy needle, 2 passes were made into the anterior 1/3 at T9. Mixed blood-stained liquid and solid material was obtained in the biopsies. Samples were again sent for microbiologic analysis. Hemostasis was achieved at the skin entry site. Patient's general anesthesia was reversed and the patient was extubated without difficulty. Neurologically the patient remained stable unchanged from prior to the beginning of the procedure. He was then sent to recovery in stable condition. IMPRESSION: 1. Status post fluoroscopic guided disc aspiration at T9-T10 as described. 2. Status post fluoroscopic guided needle placement into the T9 vertebral body with core biopsy and aspirate obtained for analysis as described. Electronically Signed   By: Luanne Bras M.D.   On: 02/04/2018 17:21   Dg Chest Port 1 View  Result Date: 01/30/2018 CLINICAL DATA:  Chest heaviness. Altered mental status. Hypotensive. History of bowel sign wrist disease. EXAM: PORTABLE CHEST 1 VIEW COMPARISON:  Chest radiographs October 25, 2017 FINDINGS: Stable cardiomegaly. Calcified aortic arch. Similar fullness of the pulmonary hila. No pleural effusion or focal consolidation in this low inspiratory examination with crowded vascular markings. No pneumothorax. Multiple EKG lines overlie the patient and may obscure subtle underlying pathology. Included soft tissue planes and osseous structures are non suspicious. IMPRESSION: 1. Stable cardiomegaly. No acute pulmonary process for this low inspiratory examination. 2.  Aortic Atherosclerosis (ICD10-I70.0). Electronically Signed   By: Elon Alas M.D.   On: 01/30/2018 17:13   [place] Ekg Site Rite  Result Date: 02/06/2018 If Site Rite image not attached, placement could not be confirmed due to current cardiac rhythm.    Echocardiogram 02/01/2018: - Left ventricle: The cavity size was normal. There was  severe focal basal hypertrophy.  Systolic function was normal. The estimated ejection fraction was in the range of 55% to 60%. Wall motion was normal; there were no regional wall motion abnormalities. The study was not technically sufficient to allow evaluation of LV diastolic dysfunction due to atrial fibrillation. - Aortic valve: Trileaflet; normal thickness, mildly calcified leaflets. There was trivial regurgitation. - Aorta: Aortic root dimension: 40 mm (ED). Ascending aortic diameter: 38 mm (S). - Aortic root: The aortic root was mildly dilated. - Ascending aorta: The ascending aorta was mildly dilated. - Left atrium: The atrium was mildly dilated. - Pericardium, extracardiac: A trivial, free-flowing pericardial effusion was identified circumferential to the heart. The fluid had no internal echoes.  02/04/2018 Dr. Estanislado Pandy:  Fluoro-guided disc aspiration T9-T10. Fluoro-guided bone biopsy T9  PICC line placement 02/07/2018  Subjective: No complaints, enjoying working with PT today. No fever, chills, back pain.  Discharge Exam: Vitals:   02/06/18 2247 02/07/18 0740  BP: (!) 144/73 125/77  Pulse: 77 73  Resp: 16   Temp: 99.6 F (37.6 C) 98.4 F (36.9 C)  SpO2: 93% 95%   General: Pt is alert, awake, not in acute distress Cardiovascular: RRR, S1/S2 +, no rubs, no gallops Respiratory: CTA bilaterally, no wheezing, no rhonchi Abdominal: Soft, NT, ND, bowel sounds + Extremities: Trace edema, no cyanosis Neuro: Alert, oriented, 0/5 LLE 1/5 RLE strength, sensation intact.  Labs: BNP (last 3 results) Recent Labs    01/30/18 1620  BNP 179.1*   Basic Metabolic Panel: Recent Labs  Lab 02/01/18 0626 02/02/18 0256 02/03/18 0318 02/05/18 0233  NA 141 139 140 138  K 4.4 4.4 4.5 4.4  CL 111 111 110 107  CO2 _0 GLUCOSE 129* 99 98 114*  BUN _1 CREATININE 1.07 1.02 1.04 0.99  CALCIUM 8.5* 8.6* 8.7* 8.5*   Liver Function  Tests: No results for input(s): AST, ALT, ALKPHOS, BILITOT, PROT, ALBUMIN in the last 168 hours. No results for input(s): LIPASE, AMYLASE in the last 168 hours. No results for input(s): AMMONIA in the last 168 hours. CBC: Recent Labs  Lab 01/31/18 1143 02/01/18 0626 02/02/18 0256 02/03/18 0318 02/05/18 0233  WBC 7.5 6.1 6.5 7.0 7.2  HGB 10.8* 10.5* 10.1* 10.6* 10.4*  HCT 35.0* 33.8* 32.9* 34.6* 33.7*  MCV 97.0 97.4 95.6 96.9 96.0  PLT 157 147* 144* 142* 155   Cardiac Enzymes: No results for input(s): CKTOTAL, CKMB, CKMBINDEX, TROPONINI in the last 168 hours. BNP: Invalid input(s): POCBNP CBG: Recent Labs  Lab 02/05/18 1227 02/05/18 1730 02/05/18 2050 02/06/18 0803 02/07/18 0740  GLUCAP 92 85 114* 82 82   D-Dimer No results for input(s): DDIMER in the last 72 hours. Hgb A1c No results for input(s): HGBA1C in the last 72 hours. Lipid Profile No results for input(s): CHOL, HDL, LDLCALC, TRIG, CHOLHDL, LDLDIRECT in the last 72 hours. Thyroid function studies No results for input(s): TSH, T4TOTAL, T3FREE, THYROIDAB in the last 72 hours.  Invalid input(s): FREET3 Anemia work up No results for input(s): VITAMINB12, FOLATE, FERRITIN, TIBC, IRON, RETICCTPCT in the last 72 hours. Urinalysis    Component Value Date/Time   COLORURINE YELLOW 01/31/2018 2048   APPEARANCEUR CLOUDY (A) 01/31/2018 2048   LABSPEC 1.015 01/31/2018 2048   PHURINE 5.0 01/31/2018 2048   GLUCOSEU NEGATIVE 01/31/2018 2048   HGBUR NEGATIVE 01/31/2018 2048   BILIRUBINUR NEGATIVE 01/31/2018 2048   KETONESUR NEGATIVE 01/31/2018 2048   PROTEINUR 30 (A) 01/31/2018 2048   UROBILINOGEN 0.2 12/30/2012 2015  NITRITE POSITIVE (A) 01/31/2018 2048   LEUKOCYTESUR LARGE (A) 01/31/2018 2048    Microbiology Recent Results (from the past 240 hour(s))  Blood Culture (routine x 2)     Status: None   Collection Time: 01/30/18  4:27 PM  Result Value Ref Range Status   Specimen Description BLOOD LEFT ANTECUBITAL   Final   Special Requests   Final    BOTTLES DRAWN AEROBIC AND ANAEROBIC Blood Culture adequate volume   Culture   Final    NO GROWTH 5 DAYS Performed at [facility], [address] [place], [place] [address]    Report Status 02/04/2018 FINAL  Final  Blood Culture (routine x 2)     Status: None   Collection Time: 01/30/18  4:32 PM  Result Value Ref Range Status   Specimen Description BLOOD RIGHT ANTECUBITAL  Final   Special Requests   Final    BOTTLES DRAWN AEROBIC AND ANAEROBIC Blood Culture adequate volume   Culture   Final    NO GROWTH 5 DAYS Performed at [facility], [place] [address], [place], [place] [address]    Report Status 02/04/2018 FINAL  Final  MRSA PCR Screening     Status: None   Collection Time: 01/31/18 10:50 PM  Result Value Ref Range Status   MRSA by PCR NEGATIVE NEGATIVE Final    Comment:        The GeneXpert MRSA Assay (FDA approved for NASAL specimens only), is one component of a comprehensive MRSA colonization surveillance program. It is not intended to diagnose MRSA infection nor to guide or monitor treatment for MRSA infections. Performed at [facility], [place] [address], [place], [place] [address]   Culture, Urine     Status: Abnormal   Collection Time: 02/01/18  2:28 PM  Result Value Ref Range Status   Specimen Description URINE, CATHETERIZED  Final   Special Requests   Final    NONE Performed at [facility], [place] [address] [place], [place] [address]    Culture >=100,000 COLONIES/mL PROTEUS MIRABILIS (A)  Final   Report Status 02/03/2018 FINAL  Final   Organism ID, Bacteria PROTEUS MIRABILIS (A)  Final      Susceptibility   Proteus mirabilis - MIC*    AMPICILLIN <=2 SENSITIVE Sensitive     CEFAZOLIN <=4 SENSITIVE Sensitive     CEFTRIAXONE <=1 SENSITIVE Sensitive     CIPROFLOXACIN <=0.25 SENSITIVE Sensitive     GENTAMICIN <=1 SENSITIVE Sensitive     IMIPENEM 2 SENSITIVE Sensitive     NITROFURANTOIN 128 RESISTANT  Resistant     TRIMETH/SULFA <=20 SENSITIVE Sensitive     AMPICILLIN/SULBACTAM <=2 SENSITIVE Sensitive     PIP/TAZO <=4 SENSITIVE Sensitive     * >=100,000 COLONIES/mL PROTEUS MIRABILIS  Aerobic/Anaerobic Culture (surgical/deep wound)     Status: None (Preliminary result)   Collection Time: 02/04/18  3:34 PM  Result Value Ref Range Status   Specimen Description BACK  Final   Special Requests DISK SPACE  Final   Gram Stain NO WBC SEEN NO ORGANISMS SEEN   Final   Culture   Final    NO GROWTH 2 DAYS NO ANAEROBES ISOLATED; CULTURE IN PROGRESS FOR 5 DAYS Performed at [facility], [address] [place], [place] [address]    Report Status PENDING  Incomplete  Fungus Culture With Stain     Status: None (Preliminary result)   Collection Time: 02/04/18  3:35 PM  Result Value Ref Range Status   Fungus Stain Final report  Final    Comment: (NOTE) Performed At: [facility] [place], [place] [id number] Rush Farmer MD [id number]    Fungus (Mycology) Culture PENDING  Incomplete   Fungal Source DISK SPACE  Final  Fungus Culture Result     Status: None   Collection Time: 02/04/18  3:35 PM  Result Value Ref Range Status   Result 1 Comment  Final    Comment: (NOTE) KOH/Calcofluor preparation:  no fungus observed. Performed At: [facility] [place], [place] [id number] Rush Farmer MD [id number]   Aerobic/Anaerobic Culture (surgical/deep wound)     Status: None (Preliminary result)   Collection Time: 02/04/18  3:56 PM  Result Value Ref Range Status   Specimen Description BONE  Final   Special Requests NONE  Final   Gram Stain NO WBC SEEN NO ORGANISMS SEEN   Final   Culture   Final    NO GROWTH 2 DAYS NO ANAEROBES ISOLATED; CULTURE IN PROGRESS FOR 5 DAYS Performed at [facility], [address] [place], [place] [address]    Report Status PENDING  Incomplete  Fungus Culture With Stain     Status: None (Preliminary  result)   Collection Time: 02/04/18  4:03 PM  Result Value Ref Range Status   Fungus Stain Final report  Final    Comment: (NOTE) Performed At: [facility] [place], [place] [id number] Rush Farmer MD [id number]    Fungus (Mycology) Culture PENDING  Incomplete   Fungal Source BONE  Final  Fungus Culture Result     Status: None   Collection Time: 02/04/18  4:03 PM  Result Value Ref Range Status   Result 1 Comment  Final    Comment: (NOTE) KOH/Calcofluor preparation:  no fungus observed. Performed At: [facility] [place], [place] [id number] Rush Farmer MD [id number]     Time coordinating discharge: Approximately 40 minutes  Patrecia Pour, MD  [facility] 02/07/2018, 11:04 AM Pager [phone]

## 2018-02-07 NOTE — Progress Notes (Signed)
Called [employer]  and gave report to [place], [place]

## 2018-02-07 NOTE — Clinical Social Work Placement (Signed)
   CLINICAL SOCIAL WORK PLACEMENT  NOTE  Date:  02/07/2018  Patient Details  Name: [name] MRN: [id number] Date of Birth: [birth year]  Clinical Social Work is seeking post-discharge placement for this patient at the Skilled  Nursing Facility level of care (*CSW will initial, date and re-position this form in  chart as items are completed):      Patient/family provided with [facility] Clinical Social Work Department's list of facilities offering this level of care within the geographic area requested by the patient (or if unable, by the patient's family).  Yes   Patient/family informed of their freedom to choose among providers that offer the needed level of care, that participate in Medicare, Medicaid or managed care program needed by the patient, have an available bed and are willing to accept the patient.      Patient/family informed of [facility]'s ownership interest in [facility] and [facility], as well as of the fact that they are under no obligation to receive care at these facilities.  PASRR submitted to EDS on       PASRR number received on       Existing PASRR number confirmed on       FL2 transmitted to all facilities in geographic area requested by pt/family on       FL2 transmitted to all facilities within larger geographic area on       Patient informed that his/her managed care company has contracts with or will negotiate with certain facilities, including the following:            Patient/family informed of bed offers received.  Patient chooses bed at [facility]     Physician recommends and patient chooses bed at      Patient to be transferred to [facility] on 02/07/18.  Patient to be transferred to facility by [facility]     Patient family notified on 02/07/18 of transfer.  Name of family member notified:  [name], spouse     PHYSICIAN       Additional Comment:    _______________________________________________ Maree Krabbe,  LCSW 02/07/2018, 12:33 PM

## 2018-02-07 NOTE — Progress Notes (Signed)
Physical Therapy Treatment Patient Details Name: [name] MRN: [id number] DOB: 03-05-1930 Today's Date: 02/07/2018    History of Present Illness 85yo male brought to the ED due to acute confusion and syncope. BP found to be 72/40, SpO2 79% on room air. Head CT negative. Admitted for hypotension and syncope. PMH dementia, hx PPM removal secondary to bacteremia, A-fib, CKD, DM, HTN, thoracic pain, closed hip reduction, lumbar discectomy, total hip revision     PT Comments    Pt received in bed, motivated and agreeable to participation in therapy. He required +2 total assist supine <> sit transfer. He tolerated sitting EOB x 12 minutes, with assist ranging from min to max assist. Pt performed BUE exercises with OT while sitting. Pt returned to supine at end of session.    Follow Up Recommendations  SNF     Equipment Recommendations  Other (comment)(TBD next venue)    Recommendations for Other Services       Precautions / Restrictions Precautions Precautions: Fall;Other (comment) Precaution Comments: bed bound and WC bound at baseline, no feeling and inconsistent motor in B LEs, severe UE weakness  Restrictions Weight Bearing Restrictions: No    Mobility  Bed Mobility Overal bed mobility: Needs Assistance Bed Mobility: Supine to Sit;Sit to Supine     Supine to sit: +2 for physical assistance;Total assist Sit to supine: +2 for physical assistance;Total assist   General bed mobility comments: helicopter method for transfer to EOB  Transfers                 General transfer comment: will require +2 hoyer/maxi transfer  Ambulation/Gait             General Gait Details: unable at baseline    Stairs             Wheelchair Mobility    Modified Rankin (Stroke Patients Only)       Balance Overall balance assessment: Needs assistance Sitting-balance support: Feet supported;No upper extremity supported Sitting balance-Leahy Scale: Poor Sitting  balance - Comments: min to max assist to maintain balance sitting EOB x 12 minutes. Increased assist level to regain after LOB posteriorly. Pt performed BUE exercises with OT while seated.  Postural control: Posterior lean                                  Cognition Arousal/Alertness: Awake/alert Behavior During Therapy: WFL for tasks assessed/performed Overall Cognitive Status: Within Functional Limits for tasks assessed                                        Exercises General Exercises - Upper Extremity Shoulder Flexion: AROM;AAROM;Both(approx 0-90*) Shoulder Horizontal ABduction: AROM;AAROM;Both;Seated Shoulder Horizontal ADduction: AROM;AAROM;Both;Seated Other Exercises Other Exercises: scapular retraction and hold for further stretching, 5x for 2 sets    General Comments General comments (skin integrity, edema, etc.): VSS      Pertinent Vitals/Pain Pain Assessment: Faces Faces Pain Scale: Hurts little more Pain Location: bilat shoulders with exercise Pain Descriptors / Indicators: Sore Pain Intervention(s): Limited activity within patient's tolerance;Monitored during session;Repositioned    Home Living                      Prior Function            PT Goals (current goals can now  be found in the care plan section) Acute Rehab PT Goals Patient Stated Goal: [facility]/continue rehab there  PT Goal Formulation: With patient/family Time For Goal Achievement: 02/14/18 Potential to Achieve Goals: Good Progress towards PT goals: Progressing toward goals    Frequency    Min 2X/week      PT Plan Current plan remains appropriate    Co-evaluation PT/OT/SLP Co-Evaluation/Treatment: Yes Reason for Co-Treatment: Complexity of the patient's impairments (multi-system involvement);For patient/therapist safety PT goals addressed during session: Mobility/safety with mobility;Balance OT goals addressed during session:  Strengthening/ROM      AM-PAC PT "6 Clicks" Mobility   Outcome Measure  Help needed turning from your back to your side while in a flat bed without using bedrails?: A Lot Help needed moving from lying on your back to sitting on the side of a flat bed without using bedrails?: Total Help needed moving to and from a bed to a chair (including a wheelchair)?: Total Help needed standing up from a chair using your arms (e.g., wheelchair or bedside chair)?: Total Help needed to walk in hospital room?: Total Help needed climbing 3-5 steps with a railing? : Total 6 Click Score: 7    End of Session   Activity Tolerance: Patient tolerated treatment well Patient left: in bed;with call bell/phone within reach;with bed alarm set;with SCD's reapplied Nurse Communication: Mobility status PT Visit Diagnosis: Muscle weakness (generalized) (M62.81);Other abnormalities of gait and mobility (R26.89);Other symptoms and signs involving the nervous system (R29.898)     Time: [id number] PT Time Calculation (min) (ACUTE ONLY): 24 min  Charges:  $Therapeutic Activity: 8-22 mins                     [name], PT  Office # [phone] Pager [phone]    [name] 02/07/2018, 10:43 AM

## 2018-02-07 NOTE — Clinical Social Work Note (Signed)
Clinical Social Worker facilitated patient discharge including contacting patient family and facility to confirm patient discharge plans.  Clinical information faxed to facility and family agreeable with plan.  CSW arranged ambulance transport via [facility] to [facility].  RN to call [phone] for report prior to discharge.  Clinical Social Worker will sign off for now as social work intervention is no longer needed. Please consult us again if new need arises.  Topaz Raglin, LCSWA [phone]  

## 2018-02-07 NOTE — Progress Notes (Addendum)
Occupational Therapy Treatment Patient Details Name: [name] MRN: [id number] DOB: 09-15-1930 Today's Date: 02/07/2018    History of present illness 85yo male brought to the ED due to acute confusion and syncope. BP found to be 72/40, SpO2 79% on room air. Head CT negative. Admitted for hypotension and syncope. PMH dementia, hx PPM removal secondary to bacteremia, A-fib, CKD, DM, HTN, thoracic pain, closed hip reduction, lumbar discectomy, total hip revision    OT comments  Pt presents supine in bed very pleasant and willing to participate in therapy session. Focus of session on sitting balance EOB, UB strengthening and overall activity tolerance. Pt requiring maxA+2 for bed mobility; tolerated sitting EOB x12 min today. Pt was briefly able to maintain static sitting balance with minguard assist, though overall requiring external assist to maintain when incorporating UE/dynamic movements. Pt participating in UE exercise and stretching (A/AAROM) within pt pain tolerance and available range. Feel POC remains appropriate at this time. Will continue to follow acutely to progress pt towards established OT goals.   Follow Up Recommendations  SNF    Equipment Recommendations  Other (comment)(defer to next venue)          Precautions / Restrictions Precautions Precautions: Fall;Other (comment) Precaution Comments: bed bound and WC bound at baseline, no feeling and inconsistent motor in B LEs, severe UE weakness  Restrictions Weight Bearing Restrictions: No       Mobility Bed Mobility Overal bed mobility: Needs Assistance Bed Mobility: Supine to Sit;Sit to Supine     Supine to sit: +2 for physical assistance;Total assist Sit to supine: +2 for physical assistance;Total assist   General bed mobility comments: helicopter method for transfer to EOB  Transfers                 General transfer comment: will require +2 hoyer/maxi transfer    Balance Overall balance  assessment: Needs assistance Sitting-balance support: Feet supported;No upper extremity supported Sitting balance-Leahy Scale: Poor Sitting balance - Comments: min to max assist to maintain balance sitting EOB x 12 minutes. Increased assist level to regain after LOB posteriorly. Pt performed BUE exercises with OT while seated.  Postural control: Posterior lean                                 ADL either performed or assessed with clinical judgement   ADL Overall ADL's : Needs assistance/impaired                     Lower Body Dressing: Total assistance;Bed level Lower Body Dressing Details (indicate cue type and reason): donning socks             Functional mobility during ADLs: Maximal assistance(bed mobility only) General ADL Comments: pt tolerating sitting EOB for 12 min this session with focus on UE A/AAROM and stretching, sitting balance with and without UE support     Vision       Perception     Praxis      Cognition Arousal/Alertness: Awake/alert Behavior During Therapy: WFL for tasks assessed/performed Overall Cognitive Status: Within Functional Limits for tasks assessed                                          Exercises Exercises: General Upper Extremity;Other exercises General Exercises - Upper Extremity Shoulder Flexion: AROM;AAROM;Both(approx  0-90*) Shoulder Horizontal ABduction: AROM;AAROM;Both;Seated Shoulder Horizontal ADduction: AROM;AAROM;Both;Seated Other Exercises Other Exercises: scapular retraction and hold for further stretching, 5x for 2 sets   Shoulder Instructions       General Comments VSS    Pertinent Vitals/ Pain       Pain Assessment: Faces Faces Pain Scale: Hurts little more Pain Location: bilat shoulders with exercise Pain Descriptors / Indicators: Sore Pain Intervention(s): Limited activity within patient's tolerance;Monitored during session;Repositioned  Home Living                                           Prior Functioning/Environment              Frequency  Min 2X/week        Progress Toward Goals  OT Goals(current goals can now be found in the care plan section)  Progress towards OT goals: Progressing toward goals  Acute Rehab OT Goals Patient Stated Goal: go back to [facility]/continue rehab there  OT Goal Formulation: With patient Time For Goal Achievement: 02/14/18 Potential to Achieve Goals: Fair  Plan Discharge plan remains appropriate    Co-evaluation    PT/OT/SLP Co-Evaluation/Treatment: Yes Reason for Co-Treatment: Complexity of the patient's impairments (multi-system involvement);For patient/therapist safety PT goals addressed during session: Mobility/safety with mobility;Balance OT goals addressed during session: Strengthening/ROM      AM-PAC OT "6 Clicks" Daily Activity     Outcome Measure   Help from another person eating meals?: None Help from another person taking care of personal grooming?: A Little Help from another person toileting, which includes using toliet, bedpan, or urinal?: Total Help from another person bathing (including washing, rinsing, drying)?: A Lot Help from another person to put on and taking off regular upper body clothing?: A Lot Help from another person to put on and taking off regular lower body clothing?: Total 6 Click Score: 13    End of Session    OT Visit Diagnosis: Muscle weakness (generalized) (M62.81)   Activity Tolerance Patient tolerated treatment well   Patient Left in bed;with call bell/phone within reach;with bed alarm set   Nurse Communication Mobility status        Time: 5003-7048 OT Time Calculation (min): 23 min  Charges: OT General Charges $OT Visit: 1 Visit OT Treatments $Therapeutic Activity: 8-22 mins  Marcy Siren, OT Supplemental Rehabilitation Services Pager [phone] Office [phone]   Orlando Penner 02/07/2018, 10:42  AM

## 2018-02-07 NOTE — Progress Notes (Signed)
PHARMACY CONSULT NOTE FOR:  OUTPATIENT  PARENTERAL ANTIBIOTIC THERAPY (OPAT)  Indication: MSSA epidural abscess and discitis/osteonyelitis Regimen: Cefazolin 2g IV q8h End date: 03/20/2018  IV antibiotic discharge orders are pended. To discharging provider:  please sign these orders via discharge navigator,  Select New Orders & click on the button choice - Manage This Unsigned Work.  Thank you for allowing pharmacy to be a part of this patient's care.  Roderic Scarce Zigmund Daniel, PharmD, BCPS PGY2 Infectious Diseases Pharmacy Resident Phone: [phone] 02/07/2018, 10:52 AM

## 2018-02-07 NOTE — Progress Notes (Signed)
[facility] for Infectious Disease  Date of Admission:  01/30/2018     Total days of antibiotics 2         ASSESSMENT/PLAN  [name] has been started on Ancef for likely worsening MSSA epidural abscess and osteomyelitis/discitis of the thoracic and lumbar spine breaking through suppression with cephalexin. Cultures remain without growth at present. He is clinically doing well with disposition to be determined. Will check baseline inflammatory markers.ID plan is for 6 weeks of IV Cefazolin with placement of PICC line scheduled for today.   1. Continue current dose of Cefazolin. 2. Check ESR and CRP. 3. Discharge IV antibiotics below pending on disposition. 4. ID office f/u with Dr. Tommy Medal in 4 weeks post discharge.   Diagnosis:  Epidural abscess and Osteomyelitis/discitis  Culture Result: Culture negative (Previous MSSA)  Allergies  Allergen Reactions  . Ace Inhibitors Cough  . Gabapentin Other (See Comments)    Unknown reaction  . Nsaids Other (See Comments)    Renal insufficiency   . Zolpidem Other (See Comments)    hallucinations Nightmares, irritable, nervous Disorientation    OPAT Orders Discharge antibiotics: Cefazolin Per pharmacy protocol   Duration:  6 weeks  End Date: 03/20/18  [facility] Care Per Protocol:  Labs weekly while on IV antibiotics: _X_ CBC with differential _X_ BMP __ CMP _X_ CRP _X_ ESR __ Vancomycin trough __ CK  __ Please pull PIC at completion of IV antibiotics _X_ Please leave PIC in place until doctor has seen patient or been notified  Fax weekly labs to [phone]  Clinic Follow Up Appt:  4 weeks post discharge with Dr. Tommy Medal     Principal Problem:   Epidural abscess Active Problems:   Osteomyelitis of thoracic spine (HCC)   Atrial fibrillation ([place])   Hypothyroidism   Type 2 diabetes mellitus with complication ([place])   Other chronic pain   Alzheimer's type dementia ([place])   Hypotension   Syncope  Pressure injury of skin   . divalproex  125 mg Oral QHS  . docusate sodium  100 mg Oral BID  . escitalopram  15 mg Oral Daily  . ferrous sulfate  325 mg Oral Q breakfast  . finasteride  5 mg Oral QHS  . lactulose  10 g Oral QHS  . lactulose  30 g Oral Daily  . levothyroxine  88 mcg Oral QAC breakfast  . lidocaine  1 patch Transdermal Q24H  . pravastatin  40 mg Oral q1800  . pregabalin  75 mg Oral Daily  . rivaroxaban  20 mg Oral Q supper  . rOPINIRole  0.5 mg Oral QHS  . sodium chloride flush  3 mL Intravenous Q12H    SUBJECTIVE:  Afebrile overnight with no acute events. Planning on PICC placement for today. Cultures remain without growth and no fungus present.   Feeling okay today, could be doing better but certainly could be doing worse. Thankful for all the good care.   Allergies  Allergen Reactions  . Ace Inhibitors Cough  . Gabapentin Other (See Comments)    Unknown reaction  . Nsaids Other (See Comments)    Renal insufficiency   . Zolpidem Other (See Comments)    hallucinations Nightmares, irritable, nervous Disorientation     Review of Systems: Review of Systems  Constitutional: Negative for chills, fever and weight loss.  Respiratory: Negative for cough, shortness of breath and wheezing.   Cardiovascular: Negative for chest pain and leg swelling.  Gastrointestinal: Negative  for abdominal pain, constipation, diarrhea, nausea and vomiting.  Musculoskeletal: Positive for back pain.  Skin: Negative for rash.      OBJECTIVE: Vitals:   02/06/18 1717 02/06/18 2247 02/07/18 0244 02/07/18 0740  BP: (!) 149/91 (!) 144/73  125/77  Pulse: 72 77  73  Resp:  16    Temp: 98.4 F (36.9 C) 99.6 F (37.6 C)  98.4 F (36.9 C)  TempSrc: Oral Oral  Oral  SpO2: 97% 93%  95%  Weight:   111 kg   Height:       Body mass index is 34.13 kg/m.  Physical Exam Constitutional:      General: He is not in acute distress.    Appearance: He is well-developed.      Comments: Lying in bed with head of bed elevated; pleasant.   Cardiovascular:     Rate and Rhythm: Normal rate. Rhythm irregularly irregular.     Heart sounds: Normal heart sounds.  Pulmonary:     Effort: Pulmonary effort is normal.     Breath sounds: Normal breath sounds.  Skin:    General: Skin is warm and dry.  Neurological:     Mental Status: He is alert.  Psychiatric:        Mood and Affect: Mood normal.     Lab Results Lab Results  Component Value Date   WBC 7.2 02/05/2018   HGB 10.4 (L) 02/05/2018   HCT 33.7 (L) 02/05/2018   MCV 96.0 02/05/2018   PLT 155 02/05/2018    Lab Results  Component Value Date   CREATININE 0.99 02/05/2018   BUN 13 02/05/2018   NA 138 02/05/2018   K 4.4 02/05/2018   CL 107 02/05/2018   CO2 26 02/05/2018    Lab Results  Component Value Date   ALT 9 01/31/2018   AST 15 01/31/2018   ALKPHOS 63 01/31/2018   BILITOT 0.6 01/31/2018     Microbiology: Recent Results (from the past 240 hour(s))  Blood Culture (routine x 2)     Status: None   Collection Time: 01/30/18  4:27 PM  Result Value Ref Range Status   Specimen Description BLOOD LEFT ANTECUBITAL  Final   Special Requests   Final    BOTTLES DRAWN AEROBIC AND ANAEROBIC Blood Culture adequate volume   Culture   Final    NO GROWTH 5 DAYS Performed at [facility], [address], [place], [place] [address]    Report Status 02/04/2018 FINAL  Final  Blood Culture (routine x 2)     Status: None   Collection Time: 01/30/18  4:32 PM  Result Value Ref Range Status   Specimen Description BLOOD RIGHT ANTECUBITAL  Final   Special Requests   Final    BOTTLES DRAWN AEROBIC AND ANAEROBIC Blood Culture adequate volume   Culture   Final    NO GROWTH 5 DAYS Performed at [facility], [place] [address] [place], [place] [address]    Report Status 02/04/2018 FINAL  Final  MRSA PCR Screening     Status: None   Collection Time: 01/31/18 10:50 PM  Result Value Ref Range Status   MRSA  by PCR NEGATIVE NEGATIVE Final    Comment:        The GeneXpert MRSA Assay (FDA approved for NASAL specimens only), is one component of a comprehensive MRSA colonization surveillance program. It is not intended to diagnose MRSA infection nor to guide or monitor treatment for MRSA infections. Performed  at [facility], [place] [address], [place], [place] [address]   Culture, Urine     Status: Abnormal   Collection Time: 02/01/18  2:28 PM  Result Value Ref Range Status   Specimen Description URINE, CATHETERIZED  Final   Special Requests   Final    NONE Performed at [facility], [place] [address], [place], [place] [address]    Culture >=100,000 COLONIES/mL PROTEUS MIRABILIS (A)  Final   Report Status 02/03/2018 FINAL  Final   Organism ID, Bacteria PROTEUS MIRABILIS (A)  Final      Susceptibility   Proteus mirabilis - MIC*    AMPICILLIN <=2 SENSITIVE Sensitive     CEFAZOLIN <=4 SENSITIVE Sensitive     CEFTRIAXONE <=1 SENSITIVE Sensitive     CIPROFLOXACIN <=0.25 SENSITIVE Sensitive     GENTAMICIN <=1 SENSITIVE Sensitive     IMIPENEM 2 SENSITIVE Sensitive     NITROFURANTOIN 128 RESISTANT Resistant     TRIMETH/SULFA <=20 SENSITIVE Sensitive     AMPICILLIN/SULBACTAM <=2 SENSITIVE Sensitive     PIP/TAZO <=4 SENSITIVE Sensitive     * >=100,000 COLONIES/mL PROTEUS MIRABILIS  Aerobic/Anaerobic Culture (surgical/deep wound)     Status: None (Preliminary result)   Collection Time: 02/04/18  3:34 PM  Result Value Ref Range Status   Specimen Description BACK  Final   Special Requests DISK SPACE  Final   Gram Stain NO WBC SEEN NO ORGANISMS SEEN   Final   Culture   Final    NO GROWTH 2 DAYS NO ANAEROBES ISOLATED; CULTURE IN PROGRESS FOR 5 DAYS Performed at [facility], [address] [place], [place] [address]    Report Status PENDING  Incomplete  Fungus Culture With Stain     Status: None (Preliminary result)   Collection Time: 02/04/18  3:35 PM  Result Value Ref  Range Status   Fungus Stain Final report  Final    Comment: (NOTE) Performed At: [facility] [place], [place] [id number] Rush Farmer MD [id number]    Fungus (Mycology) Culture PENDING  Incomplete   Fungal Source DISK SPACE  Final  Fungus Culture Result     Status: None   Collection Time: 02/04/18  3:35 PM  Result Value Ref Range Status   Result 1 Comment  Final    Comment: (NOTE) KOH/Calcofluor preparation:  no fungus observed. Performed At: [facility] [place], [place] [id number] Rush Farmer MD [id number]   Aerobic/Anaerobic Culture (surgical/deep wound)     Status: None (Preliminary result)   Collection Time: 02/04/18  3:56 PM  Result Value Ref Range Status   Specimen Description BONE  Final   Special Requests NONE  Final   Gram Stain NO WBC SEEN NO ORGANISMS SEEN   Final   Culture   Final    NO GROWTH 2 DAYS NO ANAEROBES ISOLATED; CULTURE IN PROGRESS FOR 5 DAYS Performed at [facility], [address], [place], [place] [address]    Report Status PENDING  Incomplete  Fungus Culture With Stain     Status: None (Preliminary result)   Collection Time: 02/04/18  4:03 PM  Result Value Ref Range Status   Fungus Stain Final report  Final    Comment: (NOTE) Performed At: [facility] [place], [place] [id number] Rush Farmer MD [id number]    Fungus (Mycology) Culture PENDING  Incomplete   Fungal Source BONE  Final  Fungus Culture Result  Status: None   Collection Time: 02/04/18  4:03 PM  Result Value Ref Range Status   Result 1 Comment  Final    Comment: (NOTE) KOH/Calcofluor preparation:  no fungus observed. Performed At: [facility] [address] [place], [place] [id number] Rush Farmer MD [id number]      Terri Piedra, [facility] [phone] Pager  02/07/2018  9:11 AM

## 2018-02-07 NOTE — Discharge Instructions (Signed)

## 2018-02-09 LAB — AEROBIC/ANAEROBIC CULTURE (SURGICAL/DEEP WOUND)
CULTURE: NO GROWTH
CULTURE: NO GROWTH

## 2018-02-09 LAB — AEROBIC/ANAEROBIC CULTURE W GRAM STAIN (SURGICAL/DEEP WOUND)
Gram Stain: NONE SEEN
Gram Stain: NONE SEEN

## 2018-02-16 ENCOUNTER — Emergency Department (HOSPITAL_COMMUNITY): Payer: Medicare Other

## 2018-02-16 ENCOUNTER — Encounter (HOSPITAL_COMMUNITY): Payer: Self-pay | Admitting: Emergency Medicine

## 2018-02-16 ENCOUNTER — Emergency Department (HOSPITAL_COMMUNITY)
Admission: EM | Admit: 2018-02-16 | Discharge: 2018-02-16 | Disposition: A | Discharge status: Home / Self Care | Payer: Medicare Other | Attending: Emergency Medicine | Admitting: Emergency Medicine

## 2018-02-16 DIAGNOSIS — G308 Other Alzheimer's disease: Secondary | ICD-10-CM | POA: Insufficient documentation

## 2018-02-16 DIAGNOSIS — F0280 Dementia in other diseases classified elsewhere without behavioral disturbance: Secondary | ICD-10-CM | POA: Insufficient documentation

## 2018-02-16 DIAGNOSIS — Z950 Presence of cardiac pacemaker: Secondary | ICD-10-CM | POA: Insufficient documentation

## 2018-02-16 DIAGNOSIS — I129 Hypertensive chronic kidney disease with stage 1 through stage 4 chronic kidney disease, or unspecified chronic kidney disease: Secondary | ICD-10-CM | POA: Diagnosis not present

## 2018-02-16 DIAGNOSIS — E039 Hypothyroidism, unspecified: Secondary | ICD-10-CM | POA: Diagnosis not present

## 2018-02-16 DIAGNOSIS — Z7901 Long term (current) use of anticoagulants: Secondary | ICD-10-CM | POA: Insufficient documentation

## 2018-02-16 DIAGNOSIS — E1122 Type 2 diabetes mellitus with diabetic chronic kidney disease: Secondary | ICD-10-CM | POA: Diagnosis not present

## 2018-02-16 DIAGNOSIS — R404 Transient alteration of awareness: Secondary | ICD-10-CM | POA: Insufficient documentation

## 2018-02-16 DIAGNOSIS — Z79899 Other long term (current) drug therapy: Secondary | ICD-10-CM | POA: Diagnosis not present

## 2018-02-16 DIAGNOSIS — N183 Chronic kidney disease, stage 3 (moderate): Secondary | ICD-10-CM | POA: Insufficient documentation

## 2018-02-16 LAB — URINALYSIS, ROUTINE W REFLEX MICROSCOPIC
Bilirubin Urine: NEGATIVE
Glucose, UA: NEGATIVE mg/dL
Hgb urine dipstick: NEGATIVE
Ketones, ur: NEGATIVE mg/dL
Nitrite: NEGATIVE
Protein, ur: NEGATIVE mg/dL
Specific Gravity, Urine: 1.015 (ref 1.005–1.030)
pH: 5 (ref 5.0–8.0)

## 2018-02-16 LAB — CBC WITH DIFFERENTIAL/PLATELET
Abs Immature Granulocytes: 0.04 10*3/uL (ref 0.00–0.07)
Basophils Absolute: 0 10*3/uL (ref 0.0–0.1)
Basophils Relative: 0 %
EOS ABS: 0.1 10*3/uL (ref 0.0–0.5)
Eosinophils Relative: 2 %
HCT: 30.9 % — ABNORMAL LOW (ref 39.0–52.0)
Hemoglobin: 9 g/dL — ABNORMAL LOW (ref 13.0–17.0)
Immature Granulocytes: 1 %
Lymphocytes Relative: 12 %
Lymphs Abs: 0.7 10*3/uL (ref 0.7–4.0)
MCH: 29.8 pg (ref 26.0–34.0)
MCHC: 29.1 g/dL — ABNORMAL LOW (ref 30.0–36.0)
MCV: 102.3 fL — ABNORMAL HIGH (ref 80.0–100.0)
Monocytes Absolute: 0.8 10*3/uL (ref 0.1–1.0)
Monocytes Relative: 14 %
Neutro Abs: 4.1 10*3/uL (ref 1.7–7.7)
Neutrophils Relative %: 71 %
Platelets: 168 10*3/uL (ref 150–400)
RBC: 3.02 MIL/uL — ABNORMAL LOW (ref 4.22–5.81)
RDW: 14.8 % (ref 11.5–15.5)
WBC: 5.8 10*3/uL (ref 4.0–10.5)
nRBC: 0 % (ref 0.0–0.2)

## 2018-02-16 LAB — BASIC METABOLIC PANEL
Anion gap: 7 (ref 5–15)
BUN: 24 mg/dL — ABNORMAL HIGH (ref 8–23)
CALCIUM: 8.3 mg/dL — AB (ref 8.9–10.3)
CO2: 23 mmol/L (ref 22–32)
Chloride: 109 mmol/L (ref 98–111)
Creatinine, Ser: 1.04 mg/dL (ref 0.61–1.24)
GFR calc Af Amer: >60 mL/min (ref >60)
GFR calc non Af Amer: >60 mL/min (ref >60)
Glucose, Bld: 123 mg/dL — ABNORMAL HIGH (ref 70–99)
Potassium: 4 mmol/L (ref 3.5–5.1)
Sodium: 139 mmol/L (ref 135–145)

## 2018-02-16 LAB — I-STAT TROPONIN, ED: Troponin i, poc: 0.02 ng/mL (ref 0.00–0.08)

## 2018-02-16 MED ORDER — SODIUM CHLORIDE 0.9 % IV BOLUS
500.0000 mL | Freq: Once | INTRAVENOUS | Status: AC
  Administered 2018-02-16: 500 mL via INTRAVENOUS

## 2018-02-16 NOTE — ED Triage Notes (Signed)
Pt  Here from [facility] for possible stroke , pt seems to be at baseline , pt had low grade temp and is currently being treated for an epidural abscess pt has picc in right arm

## 2018-02-16 NOTE — ED Provider Notes (Signed)
[facility] EMERGENCY DEPARTMENT Provider Note   CSN: [id number] Arrival date & time: 02/16/18  1606     History   Chief Complaint Chief Complaint  Patient presents with  . Altered Mental Status    HPI Hadyn Blankenburg is a 85 y.o. male.  The history is provided by the patient and medical records. No language interpreter was used.  Altered Mental Status     85 year old male sent here from skilled nursing facility for strokelike symptoms.  Patient has history of dementia, history of CKD, diabetes, history of recurrent hip infection, history of discitis/osteomyelitis at T8-9 and T9-10 with associated epidural phlegmon/abscess extending from T7-8 through T11-12.  Patient was admitted on 1/5 and discharged on 1/13, treated with IV Ancef, and return to skilled nursing facility.  About 2 hours ago, patient states while sitting, he develop extreme confusion.  He was seeing spots in his vision, did not know where he was at, and just felt very disoriented.  At that time, his nurse noted a mild left-sided facial droop, patient then sent here for further aeration.  Since being in the ED, patient states symptom has since resolved, he is more alert now.  He does not complain of any headache, diplopia, facial pain, trouble swallowing, neck pain, pain chest, trouble breathing, abdominal pain, back pain, dysuria or new focal numbness or weakness.  History of prior DVT currently on Xarelto.  History of chronic atrial fibrillation.  Past Medical History:  Diagnosis Date  . Alzheimer's type dementia ([place]) 12/07/2016  . Anemia   . Anxiety   . Arthritis   . Bacteremia   . Bradycardia    a. PPM removed in 2014 due to bacteremia.  . Chronic atrial fibrillation   . Chronic right shoulder pain 06/17/2015  . CKD (chronic kidney disease), stage III ([place])   . Complication of anesthesia    " Terrible Nightmares"  . Constipation   . Depression   . Diabetes mellitus without complication  ([place])    Type II  . DVT (deep venous thrombosis) ([place])    a. in 2019 - pt reports behind knee - was on Xarelto 50m BID now 239mdaily.  . Encounter for removal of cardiac resynchronization therapy pacemaker 2017  . Grief 06/17/2015  . History of blood product transfusion   . Hypertension   . Hypothyroidism   . Infected pacemaker ([facility]  . Major depression 06/17/2015  . MSSA (methicillin susceptible Staphylococcus aureus) infection   . OAB (overactive bladder)   . Overactive bladder   . Septic hip (HCG[place]  . Septicemia ([facility]  . Severe depression ([facility]11/12/2016  . Staphylococcus aureus infection   . Thoracic spine pain 11/28/2014    Patient Active Problem List   Diagnosis Date Noted  . Pressure injury of skin 02/05/2018  . 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 Hypotension 01/30/2018  . Syncope 01/30/2018  . Facial droop 11/04/2017  . Left arm weakness 11/04/2017  . Symptomatic bradycardia 11/03/2017  . Bradycardia   . Transient alteration of awareness   . Bilateral leg weakness 06/27/2017  . Depression 12/07/2016  . Alzheimer's type dementia ([facility]11/12/2016  . Anxiety 01/11/2016  . Other chronic pain 06/17/2015  . Grief 06/17/2015  . Major depression 06/17/2015  . Type 2 diabetes mellitus with complication (H[facility]  . Discitis 01/10/2015  . Osteomyelitis of thoracic spine (HCR[place]mond12/15/2016  . Thoracic spine pain 11/28/2014  . Elevated PSA 11/08/2014  . Prosthetic hip infection ([facility]  02/16/2013  . MSSA (methicillin susceptible Staphylococcus aureus) infection   . Infected pacemaker ([place])   . Closed dislocation of right hip ([place]) 10/02/2012    Class: Acute  . Retroperitoneal fluid collection 08/28/2012  . Staphylococcus aureus bacteremia 08/27/2012  . Hypokalemia 08/26/2012  . DM type 2 (diabetes mellitus, type 2) ([place]) 08/26/2012  . Thrombocytopenia ([place]) 03/21/2012  . Hypothyroidism 02/14/2012  . Hyperlipidemia LDL goal <100 02/14/2012  . Benign prostatic hyperplasia  02/14/2012  . Lower extremity weakness 02/11/2012  . Anemia in chronic kidney disease 02/11/2012  . Chronic kidney disease, stage III (moderate) ([place]) 02/11/2012  . Atrial fibrillation ([place]) 02/11/2012  . Spinal stenosis 02/11/2012  . Hip dislocation, right ([place]) 11/30/2010  . Essential hypertension 11/30/2010    Past Surgical History:  Procedure Laterality Date  . COLONOSCOPY W/ POLYPECTOMY     small  . EYE SURGERY     bilateral  . HIP CLOSED REDUCTION Right 10/02/2012   Procedure: CLOSED REDUCTION HIP;  Surgeon: Hessie Dibble, MD;  Location: [facility];  Service: Orthopedics;  Laterality: Right;  . ICD LEAD REMOVAL Left 08/31/2012   Procedure: ICD LEAD REMOVAL;  Surgeon: Evans Lance, MD;  Location: [facility];  Service: Cardiovascular;  Laterality: Left;  . INCISION AND DRAINAGE HIP Right 09/02/2012   Procedure: IRRIGATION AND DEBRIDEMENT RIGHT HIP;  Surgeon: Kerin Salen, MD;  Location: [facility];  Service: Orthopedics;  Laterality: Right;  . INCISION AND DRAINAGE HIP Right 08/10/2014   Procedure: IRRIGATION AND DEBRIDEMENT RIGHT HIP SUPERFICIAL VS DEEP;  Surgeon: Frederik Pear, MD;  Location: [facility];  Service: Orthopedics;  Laterality: Right;  . INSERT / REPLACE / REMOVE PACEMAKER     removed in 2017 d/t sepsis/notes 11/03/2017  . IR FLUORO GUIDED NEEDLE PLC ASPIRATION/INJECTION LOC  02/04/2018  . IR FLUORO GUIDED NEEDLE PLC ASPIRATION/INJECTION LOC  02/04/2018  . JOINT REPLACEMENT    . LUMBAR LAMINECTOMY/DECOMPRESSION MICRODISCECTOMY  02/14/2012   Procedure: LUMBAR LAMINECTOMY/DECOMPRESSION MICRODISCECTOMY 1 LEVEL;  Surgeon: Eustace Moore, MD;  Location: [facility];  Service: Neurosurgery;  Laterality: N/A;  Thoracic twelve - Lumbar one decompressive laminectomy.  Marland Kitchen RADIOLOGY WITH ANESTHESIA N/A 01/10/2015   Procedure: MRI LUMBAR SPINE WITH/WITHOUT   (RADIOLOGY WITH ANESTHESIA);  Surgeon: Medication Radiologist, MD;  Location: [facility];  Service: Radiology;  Laterality: N/A;  . RADIOLOGY WITH  ANESTHESIA N/A 02/04/2018   Procedure: Spinal biopsy;  Surgeon: Luanne Bras, MD;  Location: [facility];  Service: Radiology;  Laterality: N/A;  . TEE WITHOUT CARDIOVERSION N/A 08/30/2012   Procedure: TRANSESOPHAGEAL ECHOCARDIOGRAM (TEE);  Surgeon: Larey Dresser, MD;  Location: [facility];  Service: Cardiovascular;  Laterality: N/A;  . TONSILLECTOMY    . TOTAL HIP REVISION Right 09/02/2012   Procedure: REVISION OF POLYETHYLENE LINER AND FEMORAL HEAD ;  Surgeon: Kerin Salen, MD;  Location: [facility];  Service: Orthopedics;  Laterality: Right;  . TOTAL HIP REVISION Right 11/28/2012   Procedure: TOTAL HIP REVISION With Placement of Contrain Liner;  Surgeon: Kerin Salen, MD;  Location: [facility];  Service: Orthopedics;  Laterality: Right;        Home Medications    Prior to Admission medications   Medication Sig Start Date End Date Taking? Authorizing Provider  acetaminophen (TYLENOL) 500 MG tablet Take 500 mg by mouth 3 (three) times daily.    [provider]  Artificial Saliva (BIOTENE MOISTURIZING MOUTH MT) Use as directed 2 sprays in the mouth or throat See admin instructions. Apply 2  sprays directly onto mouth and tongue. Spread thoroughly inside the mouth - every 6 hours as needed for dry mouth.    [provider]  buPROPion (WELLBUTRIN SR) 100 MG 12 hr tablet Take 100 mg by mouth 2 (two) times daily.    [provider]  ceFAZolin (ANCEF) IVPB Inject 2 g into the vein every 8 (eight) hours. Indication:  MSSA epidural abscess and discitis/osteomyelitis Last Day of Therapy: 03/20/2018 Labs - Once weekly:  CBC/D and BMP, Labs - Every other week:  ESR and CRP 02/07/18 03/20/18  Patrecia Pour, MD  divalproex (DEPAKOTE SPRINKLE) 125 MG capsule Take 125 mg by mouth at bedtime.    [provider]  docusate sodium (COLACE) 100 MG capsule Take 100 mg by mouth 2 (two) times daily.     [provider]  escitalopram (LEXAPRO) 10 MG tablet Take 10 mg by mouth  daily.  10/17/17   [provider]  ferrous sulfate 325 (65 FE) MG tablet Take 325 mg by mouth 2 (two) times daily with a meal.     [provider]  finasteride (PROSCAR) 5 MG tablet Take 5 mg by mouth at bedtime.     [provider]  lactulose (CHRONULAC) 10 GM/15ML solution Take 10-30 g by mouth See admin instructions. Take 45 mls (30 g) by mouth every morning, take 15 mls (10 g) at bedtime. Hold for loose stool.    [provider]  levothyroxine (SYNTHROID, LEVOTHROID) 88 MCG tablet Take 88 mcg by mouth daily before breakfast.    [provider]  Lidocaine (ASPERCREME LIDOCAINE) 4 % PTCH Apply 3 patches topically See admin instructions. Apply one patch to left arm between shoulder and elbow every morning, remove every night (12 hrs later); apply 2nd patch between shoulder blades every morning, remove every night (12 hrs later); apply 3rd patch to right arm between shoulder and elbow every morning, remover every night (12 hrs later)    [provider]  lovastatin (MEVACOR) 40 MG tablet Take 40 mg by mouth at bedtime.  07/09/14   [provider]  nystatin (NYSTATIN) powder Apply topically See admin instructions. Apply powder to skin fold rash twice daily until resolved    [provider]  pioglitazone (ACTOS) 15 MG tablet Take 15 mg by mouth daily.    [provider]  pregabalin (LYRICA) 75 MG capsule Take 1 capsule (75 mg total) by mouth daily. 02/08/18   Patrecia Pour, MD  rivaroxaban (XARELTO) 20 MG TABS tablet Take 20 mg by mouth daily.    [provider]  rOPINIRole (REQUIP) 0.5 MG tablet Take 0.5 mg by mouth at bedtime.  10/18/17   [provider]  telmisartan (MICARDIS) 40 MG tablet Take 40 mg by mouth daily.    [provider]  terazosin (HYTRIN) 5 MG capsule Take 5 mg by mouth at bedtime.  11/15/12   [provider]  tiZANidine (ZANAFLEX) 2 MG tablet Take 2 mg by mouth 3 (three)  times daily.  12/03/17   [provider]  torsemide (DEMADEX) 10 MG tablet Take 10 mg by mouth daily.     [provider]  vitamin C (ASCORBIC ACID) 500 MG tablet Take 500 mg by mouth 2 (two) times daily.    [provider]    Family History Family History  Problem Relation Age of Onset  . Heart failure Mother     Social History Social History   Tobacco Use  .  Smoking status: Never Smoker  . Smokeless tobacco: Never Used  Substance Use Topics  . Alcohol use: No    Alcohol/week: 0.0 standard drinks  . Drug use: No     Allergies   Ace inhibitors; Gabapentin; Nsaids; and Zolpidem   Review of Systems Review of Systems  All other systems reviewed and are negative.    Physical Exam Updated Vital Signs BP 106/73   Pulse 69   Temp (!) 97.5 F (36.4 C) (Oral)   Resp (!) 23   SpO2 97%   Physical Exam Vitals signs and nursing note reviewed.  Constitutional:      General: He is not in acute distress.    Appearance: He is well-developed. He is obese.  HENT:     Head: Normocephalic and atraumatic.     Mouth/Throat:     Mouth: Mucous membranes are dry.  Eyes:     Extraocular Movements: Extraocular movements intact.     Conjunctiva/sclera: Conjunctivae normal.     Pupils: Pupils are equal, round, and reactive to light.  Neck:     Musculoskeletal: Neck supple. No neck rigidity.  Cardiovascular:     Rate and Rhythm: Rhythm irregular.     Comments: Irregularly irregular heart rhythm Pulmonary:     Effort: Pulmonary effort is normal.     Breath sounds: Normal breath sounds.  Abdominal:     Palpations: Abdomen is soft.     Tenderness: There is no abdominal tenderness.  Musculoskeletal:     Comments: Able to move both upper extremities without difficulty, 4/5 strength to bilateral lower extremities.  Skin:    Findings: No rash.  Neurological:     Mental Status: He is alert and oriented to person, place, and time.     Comments: Neurologic  exam:  Speech clear, pupils equal round reactive to light, extraocular movements intact  Normal peripheral visual fields Cranial nerves III through XII normal including no facial droop Normal sensation No pronator drift Gait not tested       ED Treatments / Results  Labs (all labs ordered are listed, but only abnormal results are displayed) Labs Reviewed  CBC WITH DIFFERENTIAL/PLATELET - Abnormal; Notable for the following components:      Result Value   RBC 3.02 (*)    Hemoglobin 9.0 (*)    HCT 30.9 (*)    MCV 102.3 (*)    MCHC 29.1 (*)    All other components within normal limits  BASIC METABOLIC PANEL - Abnormal; Notable for the following components:   Glucose, Bld 123 (*)    BUN 24 (*)    Calcium 8.3 (*)    All other components within normal limits  URINALYSIS, ROUTINE W REFLEX MICROSCOPIC - Abnormal; Notable for the following components:   Leukocytes, UA TRACE (*)    Bacteria, UA RARE (*)    All other components within normal limits  I-STAT TROPONIN, ED    EKG EKG Interpretation  Date/Time:  Wednesday February 16 2018 16:45:30 EST Ventricular Rate:  66 PR Interval:    QRS Duration: 93 QT Interval:  412 QTC Calculation: 432 R Axis:   21 Text Interpretation:  Atrial fibrillation Low voltage, extremity and precordial leads Probable anteroseptal infarct, old No significant change since last tracing Confirmed by Wandra Arthurs [id number]) on 02/16/2018 4:51:41 PM   Radiology Dg Chest 2 View  Result Date: 02/16/2018 CLINICAL DATA:  Confusion x4 months. EXAM: CHEST - 2 VIEW COMPARISON:  02/07/2018 FINDINGS: Stable cardiomegaly with aortic  atherosclerosis. Small left posterior pleural effusion with bibasilar atelectasis. PICC line tip terminates at the cavoatrial juncture. No overt pulmonary edema. Degenerative change about the included right AC and glenohumeral joints as well as along the thoracic spine. IMPRESSION: 1. Stable cardiomegaly with aortic atherosclerosis. 2.  Small left posterior pleural effusion with bibasilar atelectasis. 3. PICC line tip at the cavoatrial junction. Electronically Signed   By: Ashley Royalty M.D.   On: 02/16/2018 17:42   Ct Head Wo Contrast  Result Date: 02/16/2018 CLINICAL DATA:  Low-grade fever currently being treated for epidural abscess. Encephalopathy. EXAM: CT HEAD WITHOUT CONTRAST TECHNIQUE: Contiguous axial images were obtained from the base of the skull through the vertex without intravenous contrast. COMPARISON:  01/30/2017 head CT FINDINGS: Brain: Redemonstration of sulcal and ventricular prominence consistent with superficial and central atrophy. Chronic mild-to-moderate small vessel ischemia of periventricular and deep white matter. No large vascular territory infarction, hemorrhage or midline shift. Midline fourth ventricle and basal cisterns without effacement allowing for patient head tilt. No intra-axial mass nor extra-axial fluid. Vascular: Atherosclerosis at the skull base involving the basilar and both distal vertebral arteries and carotid siphons. Skull: Intact Sinuses/Orbits: Scattered ethmoid sinus mucosal thickening. Bilateral cataract extractions. Other: None IMPRESSION: Atrophy with chronic small vessel ischemic disease. No acute intracranial abnormality. Electronically Signed   By: Ashley Royalty M.D.   On: 02/16/2018 18:23    Procedures Procedures (including critical care time)  Medications Ordered in ED Medications  sodium chloride 0.9 % bolus 500 mL (0 mLs Intravenous Stopped 02/16/18 1828)     Initial Impression / Assessment and Plan / ED Course  I have reviewed the triage vital signs and the nursing notes.  Pertinent labs & imaging results that were available during my care of the patient were reviewed by me and considered in my medical decision making (see chart for details).     BP 106/73   Pulse 69   Temp (!) 97.5 F (36.4 C) (Oral)   Resp (!) 23   SpO2 97%    Final Clinical Impressions(s) / ED  Diagnoses   Final diagnoses:  Transient alteration of awareness    ED Discharge Orders    None     4:43 PM Patient here with transient episode of confusion happened less than 2 hours ago and nursing report mild left-sided facial droop.  Symptom has since resolved.  Does have history of osteomyelitis/discitis involving the low back currently on IV Ancef.  Denies any worsening back pain or fever.  No other complaint.  Does have history of chronic A. fib currently on Xarelto.  7:46 PM UA without signs of urinary tract infection, normal troponin, evidence of anemia with hemoglobin of 9.0 near baseline, chest x-ray shows small left posterior pleural effusion with bibasilar atelectasis.  Patient is currently at baseline, I discussed care with Dr. Quitman Livings seen and evaluated patient and felt that he is stable for discharge.  Outpatient follow-up recommended.  Return precautions discussed. Pt has a legal guardian however no contact info was documented for me to reach out to.     Domenic Moras, PA-C 02/16/18 1955    Drenda Freeze, MD 02/16/18 [phone]

## 2018-03-06 ENCOUNTER — Encounter (HOSPITAL_COMMUNITY): Payer: Self-pay

## 2018-03-06 ENCOUNTER — Emergency Department (HOSPITAL_COMMUNITY): Payer: Medicare Other

## 2018-03-06 ENCOUNTER — Inpatient Hospital Stay (HOSPITAL_COMMUNITY)
Admission: EM | Admit: 2018-03-06 | Discharge: 2018-03-09 | DRG: 094 | Disposition: A | Discharge status: Skilled Nursing Facility | Payer: Medicare Other | Source: Skilled Nursing Facility | Attending: Internal Medicine | Admitting: Internal Medicine

## 2018-03-06 ENCOUNTER — Other Ambulatory Visit: Payer: Self-pay

## 2018-03-06 DIAGNOSIS — Z886 Allergy status to analgesic agent status: Secondary | ICD-10-CM

## 2018-03-06 DIAGNOSIS — D631 Anemia in chronic kidney disease: Secondary | ICD-10-CM | POA: Diagnosis present

## 2018-03-06 DIAGNOSIS — E1101 Type 2 diabetes mellitus with hyperosmolarity with coma: Secondary | ICD-10-CM

## 2018-03-06 DIAGNOSIS — I129 Hypertensive chronic kidney disease with stage 1 through stage 4 chronic kidney disease, or unspecified chronic kidney disease: Secondary | ICD-10-CM | POA: Diagnosis present

## 2018-03-06 DIAGNOSIS — Z66 Do not resuscitate: Secondary | ICD-10-CM | POA: Diagnosis not present

## 2018-03-06 DIAGNOSIS — E1169 Type 2 diabetes mellitus with other specified complication: Secondary | ICD-10-CM | POA: Diagnosis present

## 2018-03-06 DIAGNOSIS — G9340 Encephalopathy, unspecified: Secondary | ICD-10-CM | POA: Diagnosis not present

## 2018-03-06 DIAGNOSIS — I4891 Unspecified atrial fibrillation: Secondary | ICD-10-CM | POA: Diagnosis present

## 2018-03-06 DIAGNOSIS — R4182 Altered mental status, unspecified: Secondary | ICD-10-CM

## 2018-03-06 DIAGNOSIS — N183 Chronic kidney disease, stage 3 unspecified: Secondary | ICD-10-CM | POA: Diagnosis present

## 2018-03-06 DIAGNOSIS — Z79899 Other long term (current) drug therapy: Secondary | ICD-10-CM | POA: Diagnosis not present

## 2018-03-06 DIAGNOSIS — G8220 Paraplegia, unspecified: Secondary | ICD-10-CM | POA: Diagnosis present

## 2018-03-06 DIAGNOSIS — M4646 Discitis, unspecified, lumbar region: Secondary | ICD-10-CM | POA: Diagnosis present

## 2018-03-06 DIAGNOSIS — Z7901 Long term (current) use of anticoagulants: Secondary | ICD-10-CM

## 2018-03-06 DIAGNOSIS — G3183 Dementia with Lewy bodies: Secondary | ICD-10-CM | POA: Diagnosis present

## 2018-03-06 DIAGNOSIS — Z8249 Family history of ischemic heart disease and other diseases of the circulatory system: Secondary | ICD-10-CM

## 2018-03-06 DIAGNOSIS — E669 Obesity, unspecified: Secondary | ICD-10-CM | POA: Diagnosis present

## 2018-03-06 DIAGNOSIS — E785 Hyperlipidemia, unspecified: Secondary | ICD-10-CM | POA: Diagnosis present

## 2018-03-06 DIAGNOSIS — M4624 Osteomyelitis of vertebra, thoracic region: Secondary | ICD-10-CM | POA: Diagnosis present

## 2018-03-06 DIAGNOSIS — G9520 Unspecified cord compression: Secondary | ICD-10-CM | POA: Diagnosis present

## 2018-03-06 DIAGNOSIS — I4821 Permanent atrial fibrillation: Secondary | ICD-10-CM | POA: Diagnosis not present

## 2018-03-06 DIAGNOSIS — E1122 Type 2 diabetes mellitus with diabetic chronic kidney disease: Secondary | ICD-10-CM | POA: Diagnosis present

## 2018-03-06 DIAGNOSIS — Z7189 Other specified counseling: Secondary | ICD-10-CM | POA: Diagnosis not present

## 2018-03-06 DIAGNOSIS — Z96641 Presence of right artificial hip joint: Secondary | ICD-10-CM | POA: Diagnosis present

## 2018-03-06 DIAGNOSIS — N189 Chronic kidney disease, unspecified: Secondary | ICD-10-CM

## 2018-03-06 DIAGNOSIS — M4644 Discitis, unspecified, thoracic region: Secondary | ICD-10-CM | POA: Diagnosis not present

## 2018-03-06 DIAGNOSIS — F0280 Dementia in other diseases classified elsewhere without behavioral disturbance: Secondary | ICD-10-CM | POA: Diagnosis present

## 2018-03-06 DIAGNOSIS — G9341 Metabolic encephalopathy: Secondary | ICD-10-CM | POA: Diagnosis present

## 2018-03-06 DIAGNOSIS — R29810 Facial weakness: Secondary | ICD-10-CM | POA: Diagnosis present

## 2018-03-06 DIAGNOSIS — M25519 Pain in unspecified shoulder: Secondary | ICD-10-CM | POA: Diagnosis present

## 2018-03-06 DIAGNOSIS — Z79890 Hormone replacement therapy (postmenopausal): Secondary | ICD-10-CM

## 2018-03-06 DIAGNOSIS — Z86718 Personal history of other venous thrombosis and embolism: Secondary | ICD-10-CM

## 2018-03-06 DIAGNOSIS — Z888 Allergy status to other drugs, medicaments and biological substances status: Secondary | ICD-10-CM | POA: Diagnosis not present

## 2018-03-06 DIAGNOSIS — G8929 Other chronic pain: Secondary | ICD-10-CM | POA: Diagnosis present

## 2018-03-06 DIAGNOSIS — Z515 Encounter for palliative care: Secondary | ICD-10-CM

## 2018-03-06 DIAGNOSIS — Z96649 Presence of unspecified artificial hip joint: Secondary | ICD-10-CM

## 2018-03-06 DIAGNOSIS — G8194 Hemiplegia, unspecified affecting left nondominant side: Secondary | ICD-10-CM | POA: Diagnosis present

## 2018-03-06 DIAGNOSIS — G061 Intraspinal abscess and granuloma: Secondary | ICD-10-CM | POA: Diagnosis present

## 2018-03-06 DIAGNOSIS — M4625 Osteomyelitis of vertebra, thoracolumbar region: Secondary | ICD-10-CM | POA: Diagnosis present

## 2018-03-06 DIAGNOSIS — B9561 Methicillin susceptible Staphylococcus aureus infection as the cause of diseases classified elsewhere: Secondary | ICD-10-CM | POA: Diagnosis present

## 2018-03-06 DIAGNOSIS — M4645 Discitis, unspecified, thoracolumbar region: Secondary | ICD-10-CM | POA: Diagnosis present

## 2018-03-06 DIAGNOSIS — Z7984 Long term (current) use of oral hypoglycemic drugs: Secondary | ICD-10-CM

## 2018-03-06 DIAGNOSIS — R400 Somnolence: Secondary | ICD-10-CM

## 2018-03-06 DIAGNOSIS — R29898 Other symptoms and signs involving the musculoskeletal system: Secondary | ICD-10-CM | POA: Diagnosis present

## 2018-03-06 DIAGNOSIS — E039 Hypothyroidism, unspecified: Secondary | ICD-10-CM | POA: Diagnosis present

## 2018-03-06 DIAGNOSIS — D649 Anemia, unspecified: Secondary | ICD-10-CM | POA: Diagnosis present

## 2018-03-06 DIAGNOSIS — R410 Disorientation, unspecified: Secondary | ICD-10-CM | POA: Diagnosis not present

## 2018-03-06 DIAGNOSIS — E119 Type 2 diabetes mellitus without complications: Secondary | ICD-10-CM

## 2018-03-06 DIAGNOSIS — I4811 Longstanding persistent atrial fibrillation: Secondary | ICD-10-CM | POA: Diagnosis not present

## 2018-03-06 DIAGNOSIS — Z950 Presence of cardiac pacemaker: Secondary | ICD-10-CM | POA: Diagnosis not present

## 2018-03-06 DIAGNOSIS — T8459XA Infection and inflammatory reaction due to other internal joint prosthesis, initial encounter: Secondary | ICD-10-CM

## 2018-03-06 DIAGNOSIS — Z8619 Personal history of other infectious and parasitic diseases: Secondary | ICD-10-CM | POA: Diagnosis not present

## 2018-03-06 DIAGNOSIS — M62838 Other muscle spasm: Secondary | ICD-10-CM | POA: Diagnosis present

## 2018-03-06 DIAGNOSIS — Z7401 Bed confinement status: Secondary | ICD-10-CM | POA: Diagnosis not present

## 2018-03-06 DIAGNOSIS — R5381 Other malaise: Secondary | ICD-10-CM | POA: Diagnosis present

## 2018-03-06 DIAGNOSIS — R443 Hallucinations, unspecified: Secondary | ICD-10-CM | POA: Diagnosis not present

## 2018-03-06 DIAGNOSIS — I10 Essential (primary) hypertension: Secondary | ICD-10-CM | POA: Diagnosis present

## 2018-03-06 DIAGNOSIS — N3281 Overactive bladder: Secondary | ICD-10-CM | POA: Diagnosis present

## 2018-03-06 DIAGNOSIS — R159 Full incontinence of feces: Secondary | ICD-10-CM | POA: Diagnosis present

## 2018-03-06 DIAGNOSIS — R471 Dysarthria and anarthria: Secondary | ICD-10-CM

## 2018-03-06 DIAGNOSIS — F329 Major depressive disorder, single episode, unspecified: Secondary | ICD-10-CM | POA: Diagnosis present

## 2018-03-06 DIAGNOSIS — I482 Chronic atrial fibrillation, unspecified: Secondary | ICD-10-CM | POA: Diagnosis present

## 2018-03-06 DIAGNOSIS — Z6834 Body mass index (BMI) 34.0-34.9, adult: Secondary | ICD-10-CM

## 2018-03-06 DIAGNOSIS — K5900 Constipation, unspecified: Secondary | ICD-10-CM | POA: Diagnosis present

## 2018-03-06 LAB — CBC
HCT: 32.7 % — ABNORMAL LOW (ref 39.0–52.0)
Hemoglobin: 9.8 g/dL — ABNORMAL LOW (ref 13.0–17.0)
MCH: 30 pg (ref 26.0–34.0)
MCHC: 30 g/dL (ref 30.0–36.0)
MCV: 100 fL (ref 80.0–100.0)
PLATELETS: 129 10*3/uL — AB (ref 150–400)
RBC: 3.27 MIL/uL — AB (ref 4.22–5.81)
RDW: 15.6 % — ABNORMAL HIGH (ref 11.5–15.5)
WBC: 5.8 10*3/uL (ref 4.0–10.5)
nRBC: 0 % (ref 0.0–0.2)

## 2018-03-06 LAB — COMPREHENSIVE METABOLIC PANEL
ALT: 6 U/L (ref 0–44)
AST: 22 U/L (ref 15–41)
Albumin: 2.9 g/dL — ABNORMAL LOW (ref 3.5–5.0)
Alkaline Phosphatase: 66 U/L (ref 38–126)
Anion gap: 9 (ref 5–15)
BUN: 9 mg/dL (ref 8–23)
CO2: 27 mmol/L (ref 22–32)
Calcium: 8.9 mg/dL (ref 8.9–10.3)
Chloride: 106 mmol/L (ref 98–111)
Creatinine, Ser: 1 mg/dL (ref 0.61–1.24)
GFR calc Af Amer: >60 mL/min (ref >60)
Glucose, Bld: 91 mg/dL (ref 70–99)
POTASSIUM: 4.1 mmol/L (ref 3.5–5.1)
Sodium: 142 mmol/L (ref 135–145)
Total Bilirubin: 0.5 mg/dL (ref 0.3–1.2)
Total Protein: 6.3 g/dL — ABNORMAL LOW (ref 6.5–8.1)

## 2018-03-06 LAB — BLOOD GAS, ARTERIAL
Acid-Base Excess: 4.1 mmol/L — ABNORMAL HIGH (ref 0.0–2.0)
Bicarbonate: 28.5 mmol/L — ABNORMAL HIGH (ref 20.0–28.0)
O2 Content: 2 L/min
O2 Saturation: 97.2 %
PH ART: 7.405 (ref 7.350–7.450)
Patient temperature: 98.6
pCO2 arterial: 46.4 mmHg (ref 32.0–48.0)
pO2, Arterial: 96.1 mmHg (ref 83.0–108.0)

## 2018-03-06 LAB — DIFFERENTIAL
Abs Immature Granulocytes: 0.02 10*3/uL (ref 0.00–0.07)
Basophils Absolute: 0 10*3/uL (ref 0.0–0.1)
Basophils Relative: 1 %
EOS ABS: 0.1 10*3/uL (ref 0.0–0.5)
Eosinophils Relative: 2 %
Immature Granulocytes: 0 %
Lymphocytes Relative: 16 %
Lymphs Abs: 0.9 10*3/uL (ref 0.7–4.0)
Monocytes Absolute: 0.9 10*3/uL (ref 0.1–1.0)
Monocytes Relative: 16 %
NEUTROS PCT: 65 %
Neutro Abs: 3.8 10*3/uL (ref 1.7–7.7)

## 2018-03-06 LAB — TSH: TSH: 2.294 u[IU]/mL (ref 0.350–4.500)

## 2018-03-06 LAB — URINALYSIS, ROUTINE W REFLEX MICROSCOPIC
Bilirubin Urine: NEGATIVE
Glucose, UA: NEGATIVE mg/dL
Hgb urine dipstick: NEGATIVE
Ketones, ur: NEGATIVE mg/dL
LEUKOCYTES UA: NEGATIVE
Nitrite: NEGATIVE
Protein, ur: NEGATIVE mg/dL
Specific Gravity, Urine: 1.014 (ref 1.005–1.030)
pH: 7 (ref 5.0–8.0)

## 2018-03-06 LAB — GLUCOSE, CAPILLARY: Glucose-Capillary: 68 mg/dL — ABNORMAL LOW (ref 70–99)

## 2018-03-06 LAB — I-STAT TROPONIN, ED: Troponin i, poc: 0.01 ng/mL (ref 0.00–0.08)

## 2018-03-06 LAB — LACTIC ACID, PLASMA: Lactic Acid, Venous: 1 mmol/L (ref 0.5–1.9)

## 2018-03-06 LAB — PROTIME-INR
INR: 1.74
Prothrombin Time: 20.2 seconds — ABNORMAL HIGH (ref 11.4–15.2)

## 2018-03-06 LAB — I-STAT CREATININE, ED: Creatinine, Ser: 0.9 mg/dL (ref 0.61–1.24)

## 2018-03-06 LAB — APTT: aPTT: 43 seconds — ABNORMAL HIGH (ref 24–36)

## 2018-03-06 LAB — CBG MONITORING, ED: GLUCOSE-CAPILLARY: 83 mg/dL (ref 70–99)

## 2018-03-06 MED ORDER — CEFAZOLIN IV (FOR PTA / DISCHARGE USE ONLY): 2.0000 g | Freq: Three times a day (TID) | INTRAVENOUS | Status: DC

## 2018-03-06 MED ORDER — TERAZOSIN HCL 5 MG PO CAPS
5.0000 mg | ORAL_CAPSULE | Freq: Every day | ORAL | Status: DC
  Administered 2018-03-07 – 2018-03-08 (×2): 5 mg via ORAL
  Filled 2018-03-06 (×2): qty 1

## 2018-03-06 MED ORDER — LEVOTHYROXINE SODIUM 88 MCG PO TABS: 88.0000 ug | ORAL_TABLET | Freq: Every day | ORAL | Status: DC

## 2018-03-06 MED ORDER — INSULIN ASPART 100 UNIT/ML ~~LOC~~ SOLN: 0.0000 [IU] | Freq: Three times a day (TID) | SUBCUTANEOUS | Status: DC

## 2018-03-06 MED ORDER — ACETAMINOPHEN 650 MG RE SUPP: 650.0000 mg | Freq: Four times a day (QID) | RECTAL | Status: DC | PRN

## 2018-03-06 MED ORDER — ONDANSETRON HCL 4 MG/2ML IJ SOLN
4.0000 mg | Freq: Four times a day (QID) | INTRAMUSCULAR | Status: DC | PRN
  Administered 2018-03-07: 4 mg via INTRAVENOUS
  Filled 2018-03-06 (×2): qty 2

## 2018-03-06 MED ORDER — INSULIN ASPART 100 UNIT/ML ~~LOC~~ SOLN: 0.0000 [IU] | Freq: Every day | SUBCUTANEOUS | Status: DC

## 2018-03-06 MED ORDER — LACTULOSE 10 GM/15ML PO SOLN
30.0000 g | Freq: Every day | ORAL | Status: DC
  Administered 2018-03-07 – 2018-03-09 (×3): 30 g via ORAL
  Filled 2018-03-06 (×4): qty 45

## 2018-03-06 MED ORDER — FINASTERIDE 5 MG PO TABS
5.0000 mg | ORAL_TABLET | Freq: Every day | ORAL | Status: DC
  Administered 2018-03-07 – 2018-03-08 (×2): 5 mg via ORAL
  Filled 2018-03-06 (×2): qty 1

## 2018-03-06 MED ORDER — CEFAZOLIN SODIUM-DEXTROSE 2-4 GM/100ML-% IV SOLN
2.0000 g | Freq: Three times a day (TID) | INTRAVENOUS | Status: DC
  Administered 2018-03-06 – 2018-03-07 (×3): 2 g via INTRAVENOUS
  Filled 2018-03-06 (×4): qty 100

## 2018-03-06 MED ORDER — TIZANIDINE HCL 2 MG PO TABS
2.0000 mg | ORAL_TABLET | Freq: Three times a day (TID) | ORAL | Status: DC
  Administered 2018-03-07 – 2018-03-09 (×7): 2 mg via ORAL
  Filled 2018-03-06 (×10): qty 1

## 2018-03-06 MED ORDER — LEVOTHYROXINE SODIUM 88 MCG PO TABS
88.0000 ug | ORAL_TABLET | Freq: Every day | ORAL | Status: DC
  Administered 2018-03-09: 88 ug via ORAL
  Filled 2018-03-06: qty 1

## 2018-03-06 MED ORDER — PREGABALIN 50 MG PO CAPS
75.0000 mg | ORAL_CAPSULE | Freq: Every day | ORAL | Status: DC
  Administered 2018-03-07 – 2018-03-08 (×2): 75 mg via ORAL
  Filled 2018-03-06 (×2): qty 1

## 2018-03-06 MED ORDER — DIVALPROEX SODIUM 125 MG PO CSDR
125.0000 mg | DELAYED_RELEASE_CAPSULE | Freq: Every day | ORAL | Status: DC
  Administered 2018-03-07 – 2018-03-08 (×2): 125 mg via ORAL
  Filled 2018-03-06 (×2): qty 1

## 2018-03-06 MED ORDER — ONDANSETRON HCL 4 MG PO TABS
4.0000 mg | ORAL_TABLET | Freq: Four times a day (QID) | ORAL | Status: DC | PRN
  Administered 2018-03-07: 4 mg via ORAL

## 2018-03-06 MED ORDER — SODIUM CHLORIDE 0.9 % IV SOLN
INTRAVENOUS | Status: DC
  Administered 2018-03-06 – 2018-03-07 (×2): via INTRAVENOUS

## 2018-03-06 MED ORDER — SODIUM CHLORIDE 0.9% FLUSH
3.0000 mL | Freq: Once | INTRAVENOUS | Status: AC
  Administered 2018-03-06: 3 mL via INTRAVENOUS

## 2018-03-06 MED ORDER — TORSEMIDE 20 MG PO TABS
10.0000 mg | ORAL_TABLET | Freq: Every day | ORAL | Status: DC
  Administered 2018-03-08 – 2018-03-09 (×2): 10 mg via ORAL
  Filled 2018-03-06 (×3): qty 1
  Filled 2018-03-06: qty 0.5

## 2018-03-06 MED ORDER — FERROUS SULFATE 325 (65 FE) MG PO TABS
325.0000 mg | ORAL_TABLET | Freq: Two times a day (BID) | ORAL | Status: DC
  Administered 2018-03-07 – 2018-03-09 (×6): 325 mg via ORAL
  Filled 2018-03-06 (×6): qty 1

## 2018-03-06 MED ORDER — GADOBUTROL 1 MMOL/ML IV SOLN
10.0000 mL | Freq: Once | INTRAVENOUS | Status: AC | PRN
  Administered 2018-03-06: 10 mL via INTRAVENOUS

## 2018-03-06 MED ORDER — DOCUSATE SODIUM 100 MG PO CAPS
100.0000 mg | ORAL_CAPSULE | Freq: Two times a day (BID) | ORAL | Status: DC
  Administered 2018-03-07 – 2018-03-09 (×5): 100 mg via ORAL
  Filled 2018-03-06 (×5): qty 1

## 2018-03-06 MED ORDER — LACTULOSE 10 GM/15ML PO SOLN
10.0000 g | Freq: Every day | ORAL | Status: DC
  Administered 2018-03-07 – 2018-03-08 (×2): 10 g via ORAL
  Filled 2018-03-06 (×2): qty 15

## 2018-03-06 MED ORDER — DEXTROSE IN LACTATED RINGERS 5 % IV SOLN
INTRAVENOUS | Status: DC
  Administered 2018-03-06: 22:00:00 via INTRAVENOUS

## 2018-03-06 MED ORDER — LORAZEPAM 2 MG/ML IJ SOLN
0.5000 mg | Freq: Once | INTRAMUSCULAR | Status: AC
  Administered 2018-03-06: 0.5 mg via INTRAVENOUS
  Filled 2018-03-06: qty 1

## 2018-03-06 MED ORDER — VITAMIN C 500 MG PO TABS
500.0000 mg | ORAL_TABLET | Freq: Two times a day (BID) | ORAL | Status: DC
  Administered 2018-03-07 – 2018-03-09 (×4): 500 mg via ORAL
  Filled 2018-03-06 (×5): qty 1

## 2018-03-06 MED ORDER — ESCITALOPRAM OXALATE 10 MG PO TABS
10.0000 mg | ORAL_TABLET | Freq: Every day | ORAL | Status: DC
  Administered 2018-03-07 – 2018-03-09 (×3): 10 mg via ORAL
  Filled 2018-03-06 (×3): qty 1

## 2018-03-06 MED ORDER — BUPROPION HCL ER (SR) 100 MG PO TB12
100.0000 mg | ORAL_TABLET | Freq: Two times a day (BID) | ORAL | Status: DC
  Administered 2018-03-07 – 2018-03-09 (×5): 100 mg via ORAL
  Filled 2018-03-06 (×7): qty 1

## 2018-03-06 MED ORDER — ACETAMINOPHEN 325 MG PO TABS
650.0000 mg | ORAL_TABLET | Freq: Four times a day (QID) | ORAL | Status: DC | PRN
  Administered 2018-03-07 – 2018-03-09 (×5): 650 mg via ORAL
  Filled 2018-03-06 (×5): qty 2

## 2018-03-06 MED ORDER — ROPINIROLE HCL 0.5 MG PO TABS
0.5000 mg | ORAL_TABLET | Freq: Every day | ORAL | Status: DC
  Administered 2018-03-07 – 2018-03-08 (×2): 0.5 mg via ORAL
  Filled 2018-03-06 (×4): qty 1

## 2018-03-06 NOTE — ED Triage Notes (Signed)
Pt arrives to ED from [facility] for possible stroke since this morning, LKW 0630, found at 0845. EMS reports pt has abscess in his lumbar spine and has PICC line he has been receiving IV abx for. Pt has been bed bound for several weeks, weak on left side, some left sided facial droop. Pt is on xarelto. Pt placed in position of comfort with bed locked and lowered, call bell in reach.

## 2018-03-06 NOTE — H&P (Signed)
History and Physical    Court Vivanco ERX:[id number] DOB: [birth year] DOA: 03/06/2018  PCP: Lilian Coma., MD  Patient coming from: Skilled nursing facility.  I have personally briefly reviewed patient's old medical records available.   Chief Complaint: Altered mental status.  HPI: [name] is a 85 y.o. 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  Patient has been to the hospital many times with this type of episodes.  Patient is currently obtunded and is not able to give any history.  2 of his daughters are at the bedside.  Patient's wife is on chemotherapy and she is not able to visit the hospital and she is also very sick.  I reviewed patient's previous admissions, discussed in detail with patient's daughter.  According to the daughters, he has been bedbound since about 1 year now.  He has a lot of issues about anxiety and has progressively lost cognitive function.  He was still able to eat or drink but not function very well.  Apparently patient took his morning medications at the nursing home as per records at 630.  About 930 he was noted to be sleepy, with left facial droop and not moving the left arm.  EMS was called.  Stroke alert was called and patient was brought to the ER.  No history of fever or chills at the nursing home.  He usually does not use his right leg because of hip problems.  He has involuntary movements of the left leg. ED Course: Hemodynamically stable.  Met by stroke team and decided against TPA.  It was thought to be metabolic.  Patient was  taking Xarelto until last night.  Hemodynamically stable.  Altered and lethargic.  WBC count is normal.  Electrolytes are normal.  Underwent MRI of the brain which is essentially normal.  MRI of the thoracic spine and lumbar spine showed persistent and slightly worse vertebral infection, T7-T12 mild cord compression.  Patient is afebrile.  Neurology stroke team saw the patient in the ER and ruled out stroke.  Review of Systems: Limited due to altered mental status.   Past Medical History:  Diagnosis Date  . Alzheimer's type dementia ([place]) 12/07/2016  . Anemia   . Anxiety   . Arthritis   . Bacteremia   . Bradycardia    a. PPM removed in 2014 due to bacteremia.  . Chronic atrial fibrillation   . Chronic right shoulder pain 06/17/2015  . CKD (chronic kidney disease), stage III ([place])   . Complication of anesthesia    " Terrible Nightmares"  . Constipation   . Depression   . Diabetes mellitus without complication ([place])    Type II  . DVT (deep venous thrombosis) ([place])    a. in 2019 - pt reports behind knee - was on Xarelto 66m BID now 233mdaily.  . Encounter for removal of cardiac resynchronization therapy pacemaker 2017  . Grief 06/17/2015  . History of blood product transfusion   . Hypertension   . Hypothyroidism   . Infected pac[place] ([place]  . Major depression 06/17/2015  . MSSA (methicillin susceptible Staphylococcus aureus) infection   . OAB (overactive  bladder)   . Overactive bladder   . Septic hip ([place])   . Septicemia ([place])   . Severe depression ([place]) 12/07/2016  . Staphylococcus aureus infection   . Thoracic spine pain 11/28/2014    Past Surgical History:  Procedure Laterality Date  . COLONOSCOPY W/ POLYPECTOMY     small  . EYE SURGERY     bilateral  . HIP CLOSED REDUCTION Right 10/02/2012   Procedure: CLOSED REDUCTION HIP;  Surgeon: Hessie Dibble, MD;  Location: [facility];  Service: Orthopedics;  Laterality: Right;  . ICD LEAD REMOVAL Left 08/31/2012   Procedure: ICD  LEAD REMOVAL;  Surgeon: Evans Lance, MD;  Location: [facility];  Service: Cardiovascular;  Laterality: Left;  . INCISION AND DRAINAGE HIP Right 09/02/2012   Procedure: IRRIGATION AND DEBRIDEMENT RIGHT HIP;  Surgeon: Kerin Salen, MD;  Location: [facility];  Service: Orthopedics;  Laterality: Right;  . INCISION AND DRAINAGE HIP Right 08/10/2014   Procedure: IRRIGATION AND DEBRIDEMENT RIGHT HIP SUPERFICIAL VS DEEP;  Surgeon: Frederik Pear, MD;  Location: [facility];  Service: Orthopedics;  Laterality: Right;  . INSERT / REPLACE / REMOVE PACEMAKER     removed in 2017 d/t sepsis/notes 11/03/2017  . IR FLUORO GUIDED NEEDLE PLC ASPIRATION/INJECTION LOC  02/04/2018  . IR FLUORO GUIDED NEEDLE PLC ASPIRATION/INJECTION LOC  02/04/2018  . JOINT REPLACEMENT    . LUMBAR LAMINECTOMY/DECOMPRESSION MICRODISCECTOMY  02/14/2012   Procedure: LUMBAR LAMINECTOMY/DECOMPRESSION MICRODISCECTOMY 1 LEVEL;  Surgeon: Eustace Moore, MD;  Location: [facility];  Service: Neurosurgery;  Laterality: N/A;  Thoracic twelve - Lumbar one decompressive laminectomy.  Marland Kitchen RADIOLOGY WITH ANESTHESIA N/A 01/10/2015   Procedure: MRI LUMBAR SPINE WITH/WITHOUT   (RADIOLOGY WITH ANESTHESIA);  Surgeon: Medication Radiologist, MD;  Location: [facility];  Service: Radiology;  Laterality: N/A;  . RADIOLOGY WITH ANESTHESIA N/A 02/04/2018   Procedure: Spinal biopsy;  Surgeon: Luanne Bras, MD;  Location: [facility];  Service: Radiology;  Laterality: N/A;  . TEE WITHOUT CARDIOVERSION N/A 08/30/2012   Procedure: TRANSESOPHAGEAL ECHOCARDIOGRAM (TEE);  Surgeon: Larey Dresser, MD;  Location: [facility];  Service: Cardiovascular;  Laterality: N/A;  . TONSILLECTOMY    . TOTAL HIP REVISION Right 09/02/2012   Procedure: REVISION OF POLYETHYLENE LINER AND FEMORAL HEAD ;  Surgeon: Kerin Salen, MD;  Location: [facility];  Service: Orthopedics;  Laterality: Right;  . TOTAL HIP REVISION Right 11/28/2012   Procedure: TOTAL HIP REVISION With Placement of Contrain Liner;  Surgeon: Kerin Salen, MD;  Location: [facility];  Service: Orthopedics;  Laterality: Right;     reports that he has never smoked. He has never used smokeless tobacco. He reports that he does not drink alcohol or use drugs.  Allergies  Allergen Reactions  . Ace Inhibitors Cough  . Gabapentin Other (See Comments)    Unknown reaction  . Nsaids Other (See Comments)    Renal insufficiency   . Zolpidem Other (See Comments)    hallucinations Nightmares, irritable, nervous Disorientation    Family History  Problem Relation Age of Onset  . Heart failure Mother      Prior to Admission medications   Medication Sig Start Date End Date Taking? Authorizing Provider  acetaminophen (TYLENOL) 500 MG tablet Take 500 mg by mouth 3 (three) times daily.   Yes [provider]  Artificial Saliva (BIOTENE MOISTURIZING MOUTH MT) Use as directed 2 sprays in the mouth or throat every 6 (six) hours as needed (Dry mouth). Apply 2 sprays directly  onto mouth and tongue. Spread thoroughly inside the mouth - every 6 hours as needed for dry mouth.    Yes [provider]  buPROPion (WELLBUTRIN SR) 100 MG 12 hr tablet Take 100 mg by mouth 2 (two) times daily.   Yes [provider]  ceFAZolin (ANCEF) IVPB Inject 2 g into the vein every 8 (eight) hours. Indication:  MSSA epidural abscess and discitis/osteomyelitis Last Day of Therapy: 03/20/2018 Labs - Once weekly:  CBC/D and BMP, Labs - Every other week:  ESR and CRP 02/07/18 03/20/18 Yes Patrecia Pour, MD  divalproex (DEPAKOTE SPRINKLE) 125 MG capsule Take 125 mg by mouth at bedtime.   Yes [provider]  docusate sodium (COLACE) 100 MG capsule Take 100 mg by mouth 2 (two) times daily.    Yes [provider]  escitalopram (LEXAPRO) 10 MG tablet Take 10 mg by mouth daily.  10/17/17  Yes [provider]  ferrous sulfate 325 (65 FE) MG tablet Take 325 mg by mouth 2 (two) times daily with a meal.    Yes [provider]    finasteride (PROSCAR) 5 MG tablet Take 5 mg by mouth at bedtime.    Yes [provider]  lactulose (CHRONULAC) 10 GM/15ML solution Take 10-30 g by mouth See admin instructions. Take 45 mls (30 g) by mouth every morning, take 15 mls (10 g) at bedtime. Hold for loose stool.   Yes [provider]  levothyroxine (SYNTHROID, LEVOTHROID) 88 MCG tablet Take 88 mcg by mouth daily before breakfast.   Yes [provider]  Lidocaine (ASPERCREME LIDOCAINE) 4 % PTCH Apply 3 patches topically See admin instructions. Apply one patch to left arm between shoulder and elbow every morning, remove every night (12 hrs later); apply 2nd patch between shoulder blades every morning, remove every night (12 hrs later); apply 3rd patch to right arm between shoulder and elbow every morning, remover every night (12 hrs later)   Yes [provider]  lovastatin (MEVACOR) 40 MG tablet Take 40 mg by mouth at bedtime.  07/09/14  Yes [provider]  nystatin (NYSTATIN) powder Apply topically See admin instructions. Apply powder to skin fold rash twice daily until resolved   Yes [provider]  pioglitazone (ACTOS) 15 MG tablet Take 15 mg by mouth daily.   Yes [provider]  pregabalin (LYRICA) 75 MG capsule Take 1 capsule (75 mg total) by mouth daily. Patient taking differently: Take 75 mg by mouth at bedtime.  02/08/18  Yes Patrecia Pour, MD  rivaroxaban (XARELTO) 20 MG TABS tablet Take 20 mg by mouth daily.   Yes [provider]  rOPINIRole (REQUIP) 0.5 MG tablet Take 0.5 mg by mouth at bedtime.  10/18/17  Yes [provider]  telmisartan (MICARDIS) 40 MG tablet Take 40 mg by mouth daily.   Yes [provider]  terazosin (HYTRIN) 5 MG capsule Take 5 mg by mouth at bedtime.  11/15/12  Yes [provider]  tiZANidine (ZANAFLEX) 2 MG tablet Take 2 mg by mouth 3 (three) times daily.  12/03/17  Yes [provider]  torsemide  (DEMADEX) 10 MG tablet Take 10 mg by mouth daily.    Yes [provider]  vitamin C (ASCORBIC ACID) 500 MG tablet Take 500 mg by mouth 2 (two) times daily.   Yes [provider]    Physical Exam: Vitals:   03/06/18 1130 03/06/18 1230 03/06/18 1245 03/06/18 1330  BP: 130/81 132/76 Marland Kitchen)  146/92 (!) 152/90  Pulse: 96 74 76 76  Resp: 20 (!) _0 Temp:      TempSrc:      SpO2: 99% 97% 97% 93%  Weight:      Height:        Constitutional: NAD, calm, comfortable Vitals:   03/06/18 1130 03/06/18 1230 03/06/18 1245 03/06/18 1330  BP: 130/81 132/76 (!) 146/92 (!) 152/90  Pulse: 96 74 76 76  Resp: 20 (!) _1 Temp:      TempSrc:      SpO2: 99% 97% 97% 93%  Weight:      Height:       Eyes: PERRL, lids and conjunctivae normal ENMT: Mucous membranes are moist. Posterior pharynx clear of any exudate or lesions.Normal dentition.  Neck: normal, supple, no masses, no thyromegaly Respiratory: clear to auscultation bilaterally, no wheezing, no crackles. Normal respiratory effort. No accessory muscle use.  Cardiovascular: Regular rate and rhythm, no murmurs / rubs / gallops.  2+ pedal pulses. No carotid bruits.  Abdomen: no tenderness, no masses palpated. No hepatosplenomegaly. Bowel sounds positive.  Musculoskeletal: Multiple areas of surgical scars.  Nontender. Skin: no rashes, lesions, ulcers. No induration Neurologic: Very difficult to assess.  No facial droop on inspection.  Patient is obtunded, opens eyes to painful stimuli and then goes back to sleep.  No drooling.   No spontaneous movements. Both upper extremities he tries to hold. Does not move right lower extremity, Babinski upgoing. Left leg with spontaneous movement power 2/5. Psychiatric: Obtunded and lethargic.    Labs on Admission: I have personally reviewed following labs and imaging studies  CBC: Recent Labs  Lab 03/06/18 0953  WBC 5.8  NEUTROABS 3.8  HGB 9.8*  HCT 32.7*  MCV 100.0  PLT  356*   Basic Metabolic Panel: Recent Labs  Lab 03/06/18 0952 03/06/18 0953  NA  --  142  K  --  4.1  CL  --  106  CO2  --  27  GLUCOSE  --  91  BUN  --  9  CREATININE 0.90 1.00  CALCIUM  --  8.9   GFR: Estimated Creatinine Clearance: 68.4 mL/min (by C-G formula based on SCr of 1 mg/dL). Liver Function Tests: Recent Labs  Lab 03/06/18 0953  AST 22  ALT 6  ALKPHOS 66  BILITOT 0.5  PROT 6.3*  ALBUMIN 2.9*   No results for input(s): LIPASE, AMYLASE in the last 168 hours. No results for input(s): AMMONIA in the last 168 hours. Coagulation Profile: Recent Labs  Lab 03/06/18 0953  INR 1.74   Cardiac Enzymes: No results for input(s): CKTOTAL, CKMB, CKMBINDEX, TROPONINI in the last 168 hours. BNP (last 3 results) No results for input(s): PROBNP in the last 8760 hours. HbA1C: No results for input(s): HGBA1C in the last 72 hours. CBG: Recent Labs  Lab 03/06/18 0947  GLUCAP 83   Lipid Profile: No results for input(s): CHOL, HDL, LDLCALC, TRIG, CHOLHDL, LDLDIRECT in the last 72 hours. Thyroid Function Tests: No results for input(s): TSH, T4TOTAL, FREET4, T3FREE, THYROIDAB in the last 72 hours. Anemia Panel: No results for input(s): VITAMINB12, FOLATE, FERRITIN, TIBC, IRON, RETICCTPCT in the last 72 hours. Urine analysis:    Component Value Date/Time   COLORURINE YELLOW 03/06/2018 1040   APPEARANCEUR CLEAR 03/06/2018 1040   LABSPEC 1.014 03/06/2018 1040   PHURINE 7.0 03/06/2018 1040   GLUCOSEU NEGATIVE 03/06/2018 1040   HGBUR NEGATIVE 03/06/2018 1040   BILIRUBINUR NEGATIVE  03/06/2018 1040   [place] 03/06/2018 1040   PROTEINUR NEGATIVE 03/06/2018 1040   UROBILINOGEN 0.2 12/30/2012 2015   NITRITE NEGATIVE 03/06/2018 1040   LEUKOCYTESUR NEGATIVE 03/06/2018 1040    Radiological Exams on Admission: Mr Brain Wo Contrast  Result Date: 03/06/2018 CLINICAL DATA:  85 year old male code stroke presentation with left side facial droop weakness and slurred  speech. EXAM: MRI HEAD WITHOUT CONTRAST TECHNIQUE: Multiplanar, multiecho pulse sequences of the brain and surrounding structures were obtained without intravenous contrast. COMPARISON:  Head CT earlier today. Brain MRI 01/31/2018 and earlier. FINDINGS: Brain: Study is intermittently degraded by motion artifact despite repeated imaging attempts. No restricted diffusion to suggest acute infarction. No midline shift, mass effect, evidence of mass lesion, extra-axial collection or acute intracranial hemorrhage. Cervicomedullary junction and pituitary are within normal limits. Stable cerebral volume and ex vacuo appearing ventricular enlargement. Scattered cerebral white matter T2 and FLAIR hyperintensity appears stable and is most pronounced in the external capsules. No new signal abnormality. No chronic cerebral blood products are evident Vascular: Major intracranial vascular flow voids are stable with mild to moderate generalized intracranial artery ectasia. Skull and upper cervical spine: Negative visible cervical spine. Normal bone marrow signal. Sinuses/Orbits: Stable, negative. Other: Stable trace mastoid fluid. Negative scalp and face soft tissues. IMPRESSION: 1.  No acute intracranial abnormality. 2. Stable MRI appearance of the brain since 2019. Electronically Signed   By: Genevie Ann M.D.   On: 03/06/2018 15:29   Mr Thoracic Spine W Wo Contrast  Addendum Date: 03/06/2018   ADDENDUM REPORT: 03/06/2018 16:41 ADDENDUM: Study discussed by telephone with Dr. Vivi Martens in the ED on 03/06/2018 at 1635 hours. Electronically Signed   By: Genevie Ann M.D.   On: 03/06/2018 16:41   Result Date: 03/06/2018 CLINICAL DATA:  85 year old male with back pain, possible cauda equina syndrome. History of discitis osteomyelitis and phlegmon in the lower thoracic spine last month T7 through T10. Possible early discitis osteomyelitis at L4-L5 and L5-S1 at that time. 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 EXAM: MRI THORACIC WITHOUT AND  WITH CONTRAST TECHNIQUE: Multiplanar and multiecho pulse sequences of the thoracic spine were obtained without and with intravenous contrast. CONTRAST:  10 milliliters Gadavist in conjunction with contrast enhanced imaging of the lumbar spine reported separately. COMPARISON:  Lumbar spine MRI today reported separately. Thoracic MRI 01/31/2018. FINDINGS: Limited cervical spine imaging: Stable since January. Intermittent cervical spine ankylosis. Thoracic spine segmentation: Normal, concordant with the numbering in January and on the lumbar MRI today. Alignment: Stable since January. Straightening of lower thoracic kyphosis. Vertebrae: Regressed but not resolved marrow edema in the T8 vertebral body. Stable to slightly regressed marrow edema in T9 and T10. No new levels of edema. Cord: Abnormal spinal cord signal beginning at the T8 level and continuing caudally with increased cord T2 and STIR signal (series 19, image 9). This continues into the lower thoracic cord and conus and is more apparent than on the lumbar images today. No abnormal cord enhancement is identified. But there is abnormal dural thickening and enhancement from T7-T8 through L1 which has not improved since January. There is multifactorial spinal cord compression at T9-T10 (series 27, image 26 and series 22, image 26). Above T8 spinal cord, dura and the epidural space appear within normal limits. Paraspinal and other soft tissues: Stable visible chest and upper abdominal viscera. Enhancing paraspinal phlegmon with epicenter at T9-T10 has not significantly changed since January (series 24, image 26). There is paraspinal soft tissue edema,  but no drainable paraspinal fluid collection. The epidural space there is further described below. Disc levels: C7-T1: Stable increased STIR signal in the disc space without evidence of active inflammation. T1-T2 through T6-T7: Stable, T4-T5 chronic ankylosis. T7-T8: Dural thickening and enhancement. No spinal or  foraminal stenosis. T8-T9: Paraspinal phlegmon. Abnormal signal in the disc. Dural thickening and enhancement. Superimposed posterior element hypertrophy. No epidural fluid collection. Spinal stenosis with mild spinal cord mass effect. Mild to moderate left T8 foraminal stenosis. T9-T10: Partially eroded T9 and T10 endplates with bulky endplate and paraspinal phlegmon and fluid in the disc space. Epidural space inflammation, dural thickening and enhancement. No epidural fluid collection is evident. Spinal stenosis with moderate spinal cord mass effect (series 22, image 26), not significantly changed since January. Severe bilateral T9 foraminal stenosis. T10-T11: Faint abnormal signal in the disc space. Dural thickening and enhancement. Epidural inflammation. No epidural fluid. Mild spinal stenosis. Mild to moderate bilateral T10 foraminal stenosis. T11-T12: Degenerated but otherwise negative disc space. Mild dural thickening and enhancement. No marrow edema at this level. Mild degenerative spinal stenosis and mild to moderate left greater than right T11 foraminal stenosis. T12-L1: Ankylosis with chronic mild spinal stenosis. Mild dural thickening and enhancement here might be chronic. Chronic cord myelomalacia here, and moderate to severe bilateral T12 foraminal stenosis. IMPRESSION: 1. Spinal infection T7-T8 through T11-T12 has not significantly changed since January. Associated spinal cord involvement (see #2). Discitis osteomyelitis at T9-T10, and probably also T8-T9. Dural thickening and enhancement at the other levels. Bulky paraspinal phlegmon epicenter at T9-T10, but no drainable fluid other than that in the T9-T10 disc space. 2. Spinal stenosis throughout those levels with moderate Cord Compression at T9-T10. Spinal Cord Edema from T8 inferiorly. Superimposed chronic cord myelomalacia at T12-L1. 3. No new levels of infection. 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 Electronically Signed: By: Genevie Ann M.D.  On: 03/06/2018 16:31   Mr Lumbar Spine W Wo Contrast  Result Date: 03/06/2018 CLINICAL DATA:  85 year old male with back pain, possible cauda equina syndrome. History of discitis osteomyelitis and phlegmon in the lower thoracic spine last month T7 through T10. Possible early discitis osteomyelitis at L4-L5 and L5-S1 at that time. 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 EXAM: MRI LUMBAR SPINE WITHOUT AND WITH CONTRAST TECHNIQUE: Multiplanar and multiecho pulse sequences of the lumbar spine were obtained without and with intravenous contrast. CONTRAST:  10 milliliters Gadavist in conjunction with contrast enhanced imaging of the thoracic reported separately. COMPARISON:  Thoracic MRI today reported separately. Lumbar MRI 01/31/2018. FINDINGS: Segmentation: Same numbering system as used in January which designates a vestigial S1-S2 disc space. Alignment:  Stable since January.  No lumbar spondylolisthesis. Vertebrae: The mild marrow edema at the L5-S1 level in January has resolved. There is no evidence of lumbar or upper sacral discitis osteomyelitis. No acute osseous abnormality identified. Conus medullaris and cauda equina: chronic myelomalacia in the lower thoracic spinal cord at T12-L1 appears stable. The conus is at L1-L2 and stable. Cauda equina nerve roots are stable. No abnormal intradural enhancement. No lumbar dural thickening. Paraspinal and other soft tissues: Distended urinary bladder similar to the prior study. Negative visible abdominal viscera. Diffuse paraspinal muscle atrophy is stable, relatively sparing the left psoas muscle. Chronic postoperative changes posteriorly at T12-L1, L3-L4 through the sacrum. Disc levels: Stable since January. T12-L1 ankylosis with posterior disc osteophyte complex with no high-grade spinal stenosis but chronic spinal cord myelomalacia at that level. Ankylosed L2 through L5 vertebrae. Trace increased STIR signal in the  L5-S1 disc and posterior elements appears  degenerative. Severe multifactorial spinal stenosis at L5-S1 (series 14, image 27), due to bulky disc osteophyte complex and posterior element hypertrophy. Severe left L5 foraminal stenosis. IMPRESSION: 1. No acute or inflammatory process in the lumbar spine. The mild marrow edema at L5-S1 in January has resolved. 2. Severe chronic multifactorial spinal and left foraminal stenosis at L5-S1. 3. No acute osseous abnormality. Chronic ankylosed T12-L1 and L2 through L5 levels. 4. Chronic lower thoracic spinal cord myelomalacia at T12-L1. 5. Distended urinary bladder. Electronically Signed   By: Genevie Ann M.D.   On: 03/06/2018 16:15   Dg Chest Portable 1 View  Result Date: 03/06/2018 CLINICAL DATA:  Possible stroke.  Found down. EXAM: PORTABLE CHEST 1 VIEW COMPARISON:  02/16/2018 FINDINGS: The heart is enlarged but stable. There is tortuosity and calcification of the thoracic aorta. The right PICC line is stable. Low lung volumes with vascular crowding and streaky basilar atelectasis. No definite pleural effusions. The bony thorax is intact. IMPRESSION: Cardiac enlargement. Low lung volumes with streaky basilar atelectasis but no definite infiltrates or large effusions. Electronically Signed   By: Marijo Sanes M.D.   On: 03/06/2018 10:30   Ct Head Code Stroke Wo Contrast  Result Date: 03/06/2018 CLINICAL DATA:  Code stroke. Left-sided facial droop, weakness, and slurred speech EXAM: CT HEAD WITHOUT CONTRAST TECHNIQUE: Contiguous axial images were obtained from the base of the skull through the vertex without intravenous contrast. COMPARISON:  02/16/2018 FINDINGS: Brain: No evidence of acute infarction, hemorrhage, hydrocephalus, extra-axial collection or mass lesion/mass effect. Remote right corona radiata infarct. Advanced atrophy with ventriculomegaly Vascular: No hyperdense vessel. Extensive atherosclerotic calcification. Skull: No acute finding Sinuses/Orbits: Bilateral cataract resection Other: These results  were communicated to Dr. Cheral Marker at 10:04 amon 2/9/2020by text page via the [facility] messaging system. ASPECTS [facility] Stroke Program Early CT Score) - Ganglionic level infarction (caudate, lentiform nuclei, internal capsule, insula, M1-M3 cortex): 7 - Supraganglionic infarction (M4-M6 cortex): 3 Total score (0-10 with 10 being normal): 10 IMPRESSION: 1. No acute finding or change from prior. 2. Advanced atrophy. Electronically Signed   By: Monte Fantasia M.D.   On: 03/06/2018 10:05    EKG: Independently reviewed.  Atrial fibrillation.  Low voltage.  No changes since previous EKG.  Assessment/Plan Principal Problem:   Altered mental status Active Problems:   Essential hypertension   Lower extremity weakness   Anemia in chronic kidney disease   Chronic kidney disease, stage III (moderate) (HCC)   Atrial fibrillation (HCC)   Hypothyroidism   DM type 2 (diabetes mellitus, type 2) (HCC)   Prosthetic hip infection ([place])   Osteomyelitis of thoracic spine ([place])   Spinal epidural abscess     1.  Altered mental status, acute metabolic encephalopathy: Stroke ruled out.  Suspect multifactorial including progressive dementia aggravated by presence of acute infection.  Patient may have worsening infection of the spine.  MRI of the brain is normal. Repeat blood cultures were done. We will check arterial blood gas. Continue neurochecks every 4 hours. Keep n.p.o. until patient is fully awake. We will decrease his doses of medications including SSRI and muscle relaxant when he is awake. We will try to avoid polypharmacy.  2.  Spinal epidural abscess/vertebral osteomyelitis and phlegmon: Treated with IV antibiotics with PICC line. Repeat cultures today. Continue Ancef. With evidence of slight worsening cord compression, I called and discussed case with neurosurgery.  Patient is already functionally paraplegic.  We are not sure whether he will  benefit with any surgery.  Patient will be seen by  neurosurgery. If no further intervention planned by surgery, will consult ID to see if we need to change any antibiotic plan.  3.  Type 2 diabetes on oral hypoglycemics: Keep on sliding scale insulin.  4.  Chronic atrial fibrillation: Rate controlled.  Will resume beta-blocker once patient is able to take medicine by mouth.  Hold Xarelto tonight in anticipation of any surgery.  Patient reportedly had taken Xarelto last night.  5.  Profound physical debility/poor quality of life/recurrent infection: Patient has MOST form with him.  Wished full code and treatment.  I discussed with patient's daughter at the bedside that his overall outcome and quality of life is poor. I will consult palliative medicine that can coordinate with patient and family to evaluate goals of care.    DVT prophylaxis: Xarelto. Code Status: Full code as per documentation. Family Communication: 2 daughters at the bedside. Disposition Plan: Unknown at this time.  SNF. Consults called: Neurology.  Neurosurgery. Admission status: Inpatient.   Barb Merino MD [facility] Pager [phone]  If 7PM-7AM, please contact night-coverage [email or web address] Password [facility]  03/06/2018, 5:23 PM

## 2018-03-06 NOTE — Consult Note (Signed)
Reason for Consult:epidural infection Referring Physician: Dequavion Follette [name] is an 85 y.o. male.  HPI: [name] is an 85 y.o. male with a PMHx of HTN, hypothyroidism, chronic a. Fib ( on xarelto), DM, CKD 3, lumbar spine osteomyelitis (on long term abx with PICC line at this time) who presented to [facility] as a code stroke from [facility].  Patient was last seen at 0630 when he was completely normal per staff. They went in to give him his antibiotics. At 0845 when they went back in his room he was altered with left facial droop and left sided weakness. He denies any vision changes, HA, SOB, or CP. Per facility the patient is bed bound. Patient currently being treated with IV cefazolin for lumbar osteomyelitis. Has a PICC line in place.   Past Medical History:  Diagnosis Date  . Alzheimer's type dementia (HCC) 12/07/2016  . Anemia   . Anxiety   . Arthritis   . Bacteremia   . Bradycardia    a. PPM removed in 2014 due to bacteremia.  . Chronic atrial fibrillation   . Chronic right shoulder pain 06/17/2015  . CKD (chronic kidney disease), stage III (HCC)   . Complication of anesthesia    " Terrible Nightmares"  . Constipation   . Depression   . Diabetes mellitus without complication (HCC)    Type II  . DVT (deep venous thrombosis) (HCC)    a. in 2019 - pt reports behind knee - was on Xarelto 15mg  BID now 20mg  daily.  . Encounter for removal of cardiac resynchronization therapy pacemaker 2017  . Grief 06/17/2015  . History of blood product transfusion   . Hypertension   . Hypothyroidism   . Infected pacemaker (HCC)   . Major depression 06/17/2015  . MSSA (methicillin susceptible Staphylococcus aureus) infection   . OAB (overactive bladder)   . Overactive bladder   . Septic hip (HCC)   . Septicemia (HCC)   . Severe depression (HCC) 12/07/2016  . Staphylococcus aureus infection   . Thoracic spine pain 11/28/2014    Past Surgical History:  Procedure Laterality Date  .  COLONOSCOPY W/ POLYPECTOMY     small  . EYE SURGERY     bilateral  . HIP CLOSED REDUCTION Right 10/02/2012   Procedure: CLOSED REDUCTION HIP;  Surgeon: Velna Ochs, MD;  Location: [facility] OR;  Service: Orthopedics;  Laterality: Right;  . ICD LEAD REMOVAL Left 08/31/2012   Procedure: ICD LEAD REMOVAL;  Surgeon: Marinus Maw, MD;  Location: [facility] OR;  Service: Cardiovascular;  Laterality: Left;  . INCISION AND DRAINAGE HIP Right 09/02/2012   Procedure: IRRIGATION AND DEBRIDEMENT RIGHT HIP;  Surgeon: Nestor Lewandowsky, MD;  Location: [facility] OR;  Service: Orthopedics;  Laterality: Right;  . INCISION AND DRAINAGE HIP Right 08/10/2014   Procedure: IRRIGATION AND DEBRIDEMENT RIGHT HIP SUPERFICIAL VS DEEP;  Surgeon: Gean Birchwood, MD;  Location: [facility] OR;  Service: Orthopedics;  Laterality: Right;  . INSERT / REPLACE / REMOVE PACEMAKER     removed in 2017 d/t sepsis/notes 11/03/2017  . IR FLUORO GUIDED NEEDLE PLC ASPIRATION/INJECTION LOC  02/04/2018  . IR FLUORO GUIDED NEEDLE PLC ASPIRATION/INJECTION LOC  02/04/2018  . JOINT REPLACEMENT    . LUMBAR LAMINECTOMY/DECOMPRESSION MICRODISCECTOMY  02/14/2012   Procedure: LUMBAR LAMINECTOMY/DECOMPRESSION MICRODISCECTOMY 1 LEVEL;  Surgeon: Tia Alert, MD;  Location: [facility] NEURO ORS;  Service: Neurosurgery;  Laterality: N/A;  Thoracic twelve - Lumbar one decompressive laminectomy.  Marland Kitchen RADIOLOGY WITH ANESTHESIA N/A  01/10/2015   Procedure: MRI LUMBAR SPINE WITH/WITHOUT   (RADIOLOGY WITH ANESTHESIA);  Surgeon: Medication Radiologist, MD;  Location: [facility] OR;  Service: Radiology;  Laterality: N/A;  . RADIOLOGY WITH ANESTHESIA N/A 02/04/2018   Procedure: Spinal biopsy;  Surgeon: Julieanne Cotton, MD;  Location: [facility] OR;  Service: Radiology;  Laterality: N/A;  . TEE WITHOUT CARDIOVERSION N/A 08/30/2012   Procedure: TRANSESOPHAGEAL ECHOCARDIOGRAM (TEE);  Surgeon: Laurey Morale, MD;  Location: [facility] ENDOSCOPY;  Service: Cardiovascular;  Laterality: N/A;  . TONSILLECTOMY    . TOTAL HIP REVISION  Right 09/02/2012   Procedure: REVISION OF POLYETHYLENE LINER AND FEMORAL HEAD ;  Surgeon: Nestor Lewandowsky, MD;  Location: [facility] OR;  Service: Orthopedics;  Laterality: Right;  . TOTAL HIP REVISION Right 11/28/2012   Procedure: TOTAL HIP REVISION With Placement of Contrain Liner;  Surgeon: Nestor Lewandowsky, MD;  Location: [facility] OR;  Service: Orthopedics;  Laterality: Right;    Family History  Problem Relation Age of Onset  . Heart failure Mother     Social History:  reports that he has never smoked. He has never used smokeless tobacco. He reports that he does not drink alcohol or use drugs.  Allergies:  Allergies  Allergen Reactions  . Ace Inhibitors Cough  . Gabapentin Other (See Comments)    Unknown reaction  . Nsaids Other (See Comments)    Renal insufficiency   . Zolpidem Other (See Comments)    hallucinations Nightmares, irritable, nervous Disorientation    Medications: I have reviewed the patient's current medications.  Results for orders placed or performed during the hospital encounter of 03/06/18 (from the past 48 hour(s))  CBG monitoring, ED     Status: None   Collection Time: 03/06/18  9:47 AM  Result Value Ref Range   Glucose-Capillary 83 70 - 99 mg/dL   Comment 1 Notify RN    Comment 2 Document in Chart   I-stat troponin, ED     Status: None   Collection Time: 03/06/18  9:50 AM  Result Value Ref Range   Troponin i, poc 0.01 0.00 - 0.08 ng/mL   Comment 3            Comment: Due to the release kinetics of cTnI, a negative result within the first hours of the onset of symptoms does not rule out myocardial infarction with certainty. If myocardial infarction is still suspected, repeat the test at appropriate intervals.   I-Stat Creatinine, ED (do not order at [facility])     Status: None   Collection Time: 03/06/18  9:52 AM  Result Value Ref Range   Creatinine, Ser 0.90 0.61 - 1.24 mg/dL  Protime-INR     Status: Abnormal   Collection Time: 03/06/18  9:53 AM  Result Value Ref  Range   Prothrombin Time 20.2 (H) 11.4 - 15.2 seconds   INR 1.74     Comment: Performed at [facility], [address], [place], [place] [address]  APTT     Status: Abnormal   Collection Time: 03/06/18  9:53 AM  Result Value Ref Range   aPTT 43 (H) 24 - 36 seconds    Comment:        IF BASELINE aPTT IS ELEVATED, SUGGEST PATIENT RISK ASSESSMENT BE USED TO DETERMINE APPROPRIATE ANTICOAGULANT THERAPY. Performed at [facility], [address] [place], [place] [address]   CBC     Status: Abnormal   Collection Time: 03/06/18  9:53 AM  Result Value Ref Range  WBC 5.8 4.0 - 10.5 K/uL   RBC 3.27 (L) 4.22 - 5.81 MIL/uL   Hemoglobin 9.8 (L) 13.0 - 17.0 g/dL   HCT 40.932.7 (L) 81.139.0 - 91.452.0 %   MCV 100.0 80.0 - 100.0 fL   MCH 30.0 26.0 - 34.0 pg   MCHC 30.0 30.0 - 36.0 g/dL   RDW 78.215.6 (H) 95.611.5 - 21.315.5 %   Platelets 129 (L) 150 - 400 K/uL   nRBC 0.0 0.0 - 0.2 %    Comment: Performed at [facility], [address] [place], [place] [address]  Differential     Status: None   Collection Time: 03/06/18  9:53 AM  Result Value Ref Range   Neutrophils Relative % 65 %   Neutro Abs 3.8 1.7 - 7.7 K/uL   Lymphocytes Relative 16 %   Lymphs Abs 0.9 0.7 - 4.0 K/uL   Monocytes Relative 16 %   Monocytes Absolute 0.9 0.1 - 1.0 K/uL   Eosinophils Relative 2 %   Eosinophils Absolute 0.1 0.0 - 0.5 K/uL   Basophils Relative 1 %   Basophils Absolute 0.0 0.0 - 0.1 K/uL   Immature Granulocytes 0 %   Abs Immature Granulocytes 0.02 0.00 - 0.07 K/uL    Comment: Performed at [facility], [address] [place], [place] [address]  Comprehensive metabolic panel     Status: Abnormal   Collection Time: 03/06/18  9:53 AM  Result Value Ref Range   Sodium 142 135 - 145 mmol/L   Potassium 4.1 3.5 - 5.1 mmol/L   Chloride 106 98 - 111 mmol/L   CO2 27 22 - 32 mmol/L   Glucose, Bld 91 70 - 99 mg/dL   BUN 9 8 - 23 mg/dL   Creatinine, Ser 2.951.00 0.61 - 1.24 mg/dL   Calcium 8.9 8.9 - 28.410.3 mg/dL    Total Protein 6.3 (L) 6.5 - 8.1 g/dL   Albumin 2.9 (L) 3.5 - 5.0 g/dL   AST 22 15 - 41 U/L   ALT 6 0 - 44 U/L   Alkaline Phosphatase 66 38 - 126 U/L   Total Bilirubin 0.5 0.3 - 1.2 mg/dL   GFR calc non Af Amer >60 >60 mL/min   GFR calc Af Amer >60 >60 mL/min   Anion gap 9 5 - 15    Comment: Performed at [facility], [address] [place], [place] [address]  Lactic acid, plasma     Status: None   Collection Time: 03/06/18 10:40 AM  Result Value Ref Range   Lactic Acid, Venous 1.0 0.5 - 1.9 mmol/L    Comment: Performed at [facility], [address] [place], [place] [address]  Urinalysis, Routine w reflex microscopic     Status: None   Collection Time: 03/06/18 10:40 AM  Result Value Ref Range   Color, Urine YELLOW YELLOW   APPearance CLEAR CLEAR   Specific Gravity, Urine 1.014 1.005 - 1.030   pH 7.0 5.0 - 8.0   Glucose, UA NEGATIVE NEGATIVE mg/dL   Hgb urine dipstick NEGATIVE NEGATIVE   Bilirubin Urine NEGATIVE NEGATIVE   Ketones, ur NEGATIVE NEGATIVE mg/dL   Protein, ur NEGATIVE NEGATIVE mg/dL   Nitrite NEGATIVE NEGATIVE   Leukocytes, UA NEGATIVE NEGATIVE    Comment: Performed at [facility], [address] [place], [place] [address]    Mr Brain 65Wo Contrast  Result Date: 03/06/2018 CLINICAL DATA:  85 year old male code stroke presentation with left  side facial droop weakness and slurred speech. EXAM: MRI HEAD WITHOUT CONTRAST TECHNIQUE: Multiplanar, multiecho pulse sequences of the brain and surrounding structures were obtained without intravenous contrast. COMPARISON:  Head CT earlier today. Brain MRI 01/31/2018 and earlier. FINDINGS: Brain: Study is intermittently degraded by motion artifact despite repeated imaging attempts. No restricted diffusion to suggest acute infarction. No midline shift, mass effect, evidence of mass lesion, extra-axial collection or acute intracranial hemorrhage. Cervicomedullary junction and pituitary are within normal limits.  Stable cerebral volume and ex vacuo appearing ventricular enlargement. Scattered cerebral white matter T2 and FLAIR hyperintensity appears stable and is most pronounced in the external capsules. No new signal abnormality. No chronic cerebral blood products are evident Vascular: Major intracranial vascular flow voids are stable with mild to moderate generalized intracranial artery ectasia. Skull and upper cervical spine: Negative visible cervical spine. Normal bone marrow signal. Sinuses/Orbits: Stable, negative. Other: Stable trace mastoid fluid. Negative scalp and face soft tissues. IMPRESSION: 1.  No acute intracranial abnormality. 2. Stable MRI appearance of the brain since 2019. Electronically Signed   By: Odessa Fleming M.D.   On: 03/06/2018 15:29   Mr Thoracic Spine W Wo Contrast  Addendum Date: 03/06/2018   ADDENDUM REPORT: 03/06/2018 16:41 ADDENDUM: Study discussed by telephone with Dr. Bruce Donath in the ED on 03/06/2018 at 1635 hours. Electronically Signed   By: Odessa Fleming M.D.   On: 03/06/2018 16:41   Result Date: 03/06/2018 CLINICAL DATA:  85 year old male with back pain, possible cauda equina syndrome. History of discitis osteomyelitis and phlegmon in the lower thoracic spine last month T7 through T10. Possible early discitis osteomyelitis at L4-L5 and L5-S1 at that time. 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 EXAM: MRI THORACIC WITHOUT AND WITH CONTRAST TECHNIQUE: Multiplanar and multiecho pulse sequences of the thoracic spine were obtained without and with intravenous contrast. CONTRAST:  10 milliliters Gadavist in conjunction with contrast enhanced imaging of the lumbar spine reported separately. COMPARISON:  Lumbar spine MRI today reported separately. Thoracic MRI 01/31/2018. FINDINGS: Limited cervical spine imaging: Stable since January. Intermittent cervical spine ankylosis. Thoracic spine segmentation: Normal, concordant with the numbering in January and on the lumbar MRI today. Alignment: Stable since  January. Straightening of lower thoracic kyphosis. Vertebrae: Regressed but not resolved marrow edema in the T8 vertebral body. Stable to slightly regressed marrow edema in T9 and T10. No new levels of edema. Cord: Abnormal spinal cord signal beginning at the T8 level and continuing caudally with increased cord T2 and STIR signal (series 19, image 9). This continues into the lower thoracic cord and conus and is more apparent than on the lumbar images today. No abnormal cord enhancement is identified. But there is abnormal dural thickening and enhancement from T7-T8 through L1 which has not improved since January. There is multifactorial spinal cord compression at T9-T10 (series 27, image 26 and series 22, image 26). Above T8 spinal cord, dura and the epidural space appear within normal limits. Paraspinal and other soft tissues: Stable visible chest and upper abdominal viscera. Enhancing paraspinal phlegmon with epicenter at T9-T10 has not significantly changed since January (series 24, image 26). There is paraspinal soft tissue edema, but no drainable paraspinal fluid collection. The epidural space there is further described below. Disc levels: C7-T1: Stable increased STIR signal in the disc space without evidence of active inflammation. T1-T2 through T6-T7: Stable, T4-T5 chronic ankylosis. T7-T8: Dural thickening and enhancement. No spinal or foraminal stenosis. T8-T9: Paraspinal phlegmon. Abnormal signal in the disc. Dural thickening  and enhancement. Superimposed posterior element hypertrophy. No epidural fluid collection. Spinal stenosis with mild spinal cord mass effect. Mild to moderate left T8 foraminal stenosis. T9-T10: Partially eroded T9 and T10 endplates with bulky endplate and paraspinal phlegmon and fluid in the disc space. Epidural space inflammation, dural thickening and enhancement. No epidural fluid collection is evident. Spinal stenosis with moderate spinal cord mass effect (series 22, image 26),  not significantly changed since January. Severe bilateral T9 foraminal stenosis. T10-T11: Faint abnormal signal in the disc space. Dural thickening and enhancement. Epidural inflammation. No epidural fluid. Mild spinal stenosis. Mild to moderate bilateral T10 foraminal stenosis. T11-T12: Degenerated but otherwise negative disc space. Mild dural thickening and enhancement. No marrow edema at this level. Mild degenerative spinal stenosis and mild to moderate left greater than right T11 foraminal stenosis. T12-L1: Ankylosis with chronic mild spinal stenosis. Mild dural thickening and enhancement here might be chronic. Chronic cord myelomalacia here, and moderate to severe bilateral T12 foraminal stenosis. IMPRESSION: 1. Spinal infection T7-T8 through T11-T12 has not significantly changed since January. Associated spinal cord involvement (see #2). Discitis osteomyelitis at T9-T10, and probably also T8-T9. Dural thickening and enhancement at the other levels. Bulky paraspinal phlegmon epicenter at T9-T10, but no drainable fluid other than that in the T9-T10 disc space. 2. Spinal stenosis throughout those levels with moderate Cord Compression at T9-T10. Spinal Cord Edema from T8 inferiorly. Superimposed chronic cord myelomalacia at T12-L1. 3. No new levels of infection. 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 Electronically Signed: By: Odessa Fleming M.D. On: 03/06/2018 16:31   Mr Lumbar Spine W Wo Contrast  Result Date: 03/06/2018 CLINICAL DATA:  85 year old male with back pain, possible cauda equina syndrome. History of discitis osteomyelitis and phlegmon in the lower thoracic spine last month T7 through T10. Possible early discitis osteomyelitis at L4-L5 and L5-S1 at that time. 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 EXAM: MRI LUMBAR SPINE WITHOUT AND WITH CONTRAST TECHNIQUE: Multiplanar and multiecho pulse sequences of the lumbar spine were obtained without and with intravenous contrast. CONTRAST:  10 milliliters  Gadavist in conjunction with contrast enhanced imaging of the thoracic reported separately. COMPARISON:  Thoracic MRI today reported separately. Lumbar MRI 01/31/2018. FINDINGS: Segmentation: Same numbering system as used in January which designates a vestigial S1-S2 disc space. Alignment:  Stable since January.  No lumbar spondylolisthesis. Vertebrae: The mild marrow edema at the L5-S1 level in January has resolved. There is no evidence of lumbar or upper sacral discitis osteomyelitis. No acute osseous abnormality identified. Conus medullaris and cauda equina: chronic myelomalacia in the lower thoracic spinal cord at T12-L1 appears stable. The conus is at L1-L2 and stable. Cauda equina nerve roots are stable. No abnormal intradural enhancement. No lumbar dural thickening. Paraspinal and other soft tissues: Distended urinary bladder similar to the prior study. Negative visible abdominal viscera. Diffuse paraspinal muscle atrophy is stable, relatively sparing the left psoas muscle. Chronic postoperative changes posteriorly at T12-L1, L3-L4 through the sacrum. Disc levels: Stable since January. T12-L1 ankylosis with posterior disc osteophyte complex with no high-grade spinal stenosis but chronic spinal cord myelomalacia at that level. Ankylosed L2 through L5 vertebrae. Trace increased STIR signal in the L5-S1 disc and posterior elements appears degenerative. Severe multifactorial spinal stenosis at L5-S1 (series 14, image 27), due to bulky disc osteophyte complex and posterior element hypertrophy. Severe left L5 foraminal stenosis. IMPRESSION: 1. No acute or inflammatory process in the lumbar spine. The mild marrow edema at L5-S1 in January has resolved. 2. Severe chronic multifactorial  spinal and left foraminal stenosis at L5-S1. 3. No acute osseous abnormality. Chronic ankylosed T12-L1 and L2 through L5 levels. 4. Chronic lower thoracic spinal cord myelomalacia at T12-L1. 5. Distended urinary bladder. Electronically  Signed   By: Odessa FlemingH  Hall M.D.   On: 03/06/2018 16:15   Dg Chest Portable 1 View  Result Date: 03/06/2018 CLINICAL DATA:  Possible stroke.  Found down. EXAM: PORTABLE CHEST 1 VIEW COMPARISON:  02/16/2018 FINDINGS: The heart is enlarged but stable. There is tortuosity and calcification of the thoracic aorta. The right PICC line is stable. Low lung volumes with vascular crowding and streaky basilar atelectasis. No definite pleural effusions. The bony thorax is intact. IMPRESSION: Cardiac enlargement. Low lung volumes with streaky basilar atelectasis but no definite infiltrates or large effusions. Electronically Signed   By: Rudie MeyerP.  Gallerani M.D.   On: 03/06/2018 10:30   Ct Head Code Stroke Wo Contrast  Result Date: 03/06/2018 CLINICAL DATA:  Code stroke. Left-sided facial droop, weakness, and slurred speech EXAM: CT HEAD WITHOUT CONTRAST TECHNIQUE: Contiguous axial images were obtained from the base of the skull through the vertex without intravenous contrast. COMPARISON:  02/16/2018 FINDINGS: Brain: No evidence of acute infarction, hemorrhage, hydrocephalus, extra-axial collection or mass lesion/mass effect. Remote right corona radiata infarct. Advanced atrophy with ventriculomegaly Vascular: No hyperdense vessel. Extensive atherosclerotic calcification. Skull: No acute finding Sinuses/Orbits: Bilateral cataract resection Other: These results were communicated to Dr. Otelia LimesLindzen at 10:04 amon 2/9/2020by text page via the [facility] messaging system. ASPECTS [facility]([place] Stroke Program Early CT Score) - Ganglionic level infarction (caudate, lentiform nuclei, internal capsule, insula, M1-M3 cortex): 7 - Supraganglionic infarction (M4-M6 cortex): 3 Total score (0-10 with 10 being normal): 10 IMPRESSION: 1. No acute finding or change from prior. 2. Advanced atrophy. Electronically Signed   By: Marnee SpringJonathon  Watts M.D.   On: 03/06/2018 10:05    Review of Systems - Negative except As per HPI    Blood pressure (!) 152/90, pulse 76,  temperature 98.2 F (36.8 C), temperature source Rectal, resp. rate 17, height 5\' 11"  (1.803 m), weight 111 kg, SpO2 93 %.  Per Dr. Jerral RalphGhimire, patient has been bed bound for a long time.  He has no effective use of right side and 2/5 use of left side lower extremities at his best.  Assessment/Plan: Patient has progressive destruction of T 9 vertebra with worsening epidural infection with cord compression.  He is effectively paraplegic and has multiple co-morbidities, including advanced kidney disease, osteomyelitis, diabetes, A Fib.  He is not a surgical candidate and it does not make sense to expose him to the risks of what would be an extensive surgery with pre-morbid bedridden state and effective paraplegia.  Dorian HeckleJoseph D Naesha Buckalew, MD 03/06/2018, 5:17 PM

## 2018-03-06 NOTE — Code Documentation (Signed)
85 year old male presents to [facility] via [place] as code stroke which was called in the field.  Patient resides at [facility] - he is bed bound per staff due to osteomyelitis of spine - currently being treated with IV abx via PICC line.  Staff report he was at his normal baseline of awake and alert - conversing at 0630 this AM = they went back at [place] and discovered him to be sleepy, left facial droop and left side weakness with slurred speech.  He was met at the bridge in the ED by stroke team and ED staff - to CT scan which is negative for hemorrhage.  He is drowsy but arousable - falls back to sleep - has some jerking movements of face and arms - which stop with stimulation.  His speech is slurred - bil legs unable to resist gravity - bed bound - left facial droop - NIHHS 11.  Patient is on Xarelto for afib.  Dr. Cheral Marker to bedside - no need for further imaging - not IR candidate MRS 4.  Back to ED.  Daughters at bedside - they describe this as a repeated event he has experienced over the past few months.  They report is function has decreased tremendously over last 4 months - they report these events frequently happen in the AM including the left facial droop - the slurred speech and sleepiness.  No acute stroke treatment.  Handoff to [employer].

## 2018-03-06 NOTE — ED Provider Notes (Signed)
[facility] EMERGENCY DEPARTMENT Provider Note   CSN: [id number] Arrival date & time: 03/06/18  0945   LEVEL 5 CAVEAT - DEMENTIA   History   Chief Complaint Chief Complaint  Patient presents with  . Code Stroke    HPI Laron Horsch is a 85 y.o. male.  HPI  85 year old male brought in by EMS for acute code stroke.  Patient has dementia and is unable to provide history.  The patient is bedbound and is currently on IV antibiotics for osteomyelitis.  Last seen well at 630 and now has facial droop on the left side and may be some weakness in the left arm.  Vitals stable for EMS.  Further history is very limited.  After initial work-up, daughters arrived.  They state that the patient has had a progressive decline for at least 1 month.  He has been on IV antibiotics for the spinal infection.  However they feel the weakness is worsening and a sending.  He has had bouts of slurred speech and facial droop on and off for weeks.  They feel that he has been much worse over the last few days, not just this morning.  Past Medical History:  Diagnosis Date  . Alzheimer's type dementia ([place]) 12/07/2016  . Anemia   . Anxiety   . Arthritis   . Bacteremia   . Bradycardia    a. PPM removed in 2014 due to bacteremia.  . Chronic atrial fibrillation   . Chronic right shoulder pain 06/17/2015  . CKD (chronic kidney disease), stage III ([place])   . Complication of anesthesia    " Terrible Nightmares"  . Constipation   . Depression   . Diabetes mellitus without complication ([place])    Type II  . DVT (deep venous thrombosis) ([place])    a. in 2019 - pt reports behind knee - was on Xarelto 73m BID now 226mdaily.  . Encounter for removal of cardiac resynchronization therapy pacemaker 2017  . Grief 06/17/2015  . History of blood product transfusion   . Hypertension   . Hypothyroidism   . Infected pacemaker ([facility]  . Major depression 06/17/2015  . MSSA (methicillin susceptible  Staphylococcus aureus) infection   . OAB (overactive bladder)   . Overactive bladder   . Septic hip ([facility]  . Septicemia ([facility]  . Severe depression (HC[place]ent[place]11/12/2016  . Staphylococcus aureus infection   . Thoracic spine pain 11/28/2014    Patient Active Problem List   Diagnosis Date Noted  . Pressure injury of skin 02/05/2018  . 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 Hypotension 01/30/2018  . Syncope 01/30/2018  . Facial droop 11/04/2017  . Left arm weakness 11/04/2017  . Symptomatic bradycardia 11/03/2017  . Bradycardia   . Transient alteration of awareness   . Bilateral leg weakness 06/27/2017  . Depression 12/07/2016  . Alzheimer's type dementia (HCElk [place]11/12/2016  . Anxiety 01/11/2016  . Other chronic pain 06/17/2015  . Grief 06/17/2015  . Major depression 06/17/2015  . Type 2 diabetes mellitus with complication (HCF[place]  . Discitis 01/10/2015  . Osteomyelitis of thoracic spine (HCParker12/15/2016  . Thoracic spine pain 11/28/2014  . Elevated PSA 11/08/2014  . Prosthetic hip infection (HCHamlin01/22/2015  . MSSA (methicillin susceptible Staphylococcus aureus) infection   . Infected pacemaker (HCCeleste  . Closed dislocation of right hip (HCBee Cave09/07/2012    Class: Acute  . Retroperitoneal fluid collection 08/28/2012  . Staphylococcus aureus bacteremia 08/27/2012  . Hypokalemia 08/26/2012  .  DM type 2 (diabetes mellitus, type 2) ([place]) 08/26/2012  . Thrombocytopenia ([place]) 03/21/2012  . Hypothyroidism 02/14/2012  . Hyperlipidemia LDL goal <100 02/14/2012  . Benign prostatic hyperplasia 02/14/2012  . Lower extremity weakness 02/11/2012  . Anemia in chronic kidney disease 02/11/2012  . Chronic kidney disease, stage III (moderate) ([place]) 02/11/2012  . Atrial fibrillation ([place]) 02/11/2012  . Spinal stenosis 02/11/2012  . Hip dislocation, right ([place]) 11/30/2010  . Essential hypertension 11/30/2010    Past Surgical History:  Procedure Laterality Date  . COLONOSCOPY W/ POLYPECTOMY      small  . EYE SURGERY     bilateral  . HIP CLOSED REDUCTION Right 10/02/2012   Procedure: CLOSED REDUCTION HIP;  Surgeon: Hessie Dibble, MD;  Location: [facility];  Service: Orthopedics;  Laterality: Right;  . ICD LEAD REMOVAL Left 08/31/2012   Procedure: ICD LEAD REMOVAL;  Surgeon: Evans Lance, MD;  Location: [facility];  Service: Cardiovascular;  Laterality: Left;  . INCISION AND DRAINAGE HIP Right 09/02/2012   Procedure: IRRIGATION AND DEBRIDEMENT RIGHT HIP;  Surgeon: Kerin Salen, MD;  Location: [facility];  Service: Orthopedics;  Laterality: Right;  . INCISION AND DRAINAGE HIP Right 08/10/2014   Procedure: IRRIGATION AND DEBRIDEMENT RIGHT HIP SUPERFICIAL VS DEEP;  Surgeon: Frederik Pear, MD;  Location: [facility];  Service: Orthopedics;  Laterality: Right;  . INSERT / REPLACE / REMOVE PACEMAKER     removed in 2017 d/t sepsis/notes 11/03/2017  . IR FLUORO GUIDED NEEDLE PLC ASPIRATION/INJECTION LOC  02/04/2018  . IR FLUORO GUIDED NEEDLE PLC ASPIRATION/INJECTION LOC  02/04/2018  . JOINT REPLACEMENT    . LUMBAR LAMINECTOMY/DECOMPRESSION MICRODISCECTOMY  02/14/2012   Procedure: LUMBAR LAMINECTOMY/DECOMPRESSION MICRODISCECTOMY 1 LEVEL;  Surgeon: Eustace Moore, MD;  Location: [facility] [facility];  Service: Neurosurgery;  Laterality: N/A;  Thoracic twelve - Lumbar one decompressive laminectomy.  Marland Kitchen RADIOLOGY WITH ANESTHESIA N/A 01/10/2015   Procedure: MRI LUMBAR SPINE WITH/WITHOUT   (RADIOLOGY WITH ANESTHESIA);  Surgeon: Medication Radiologist, MD;  Location: [facility];  Service: Radiology;  Laterality: N/A;  . RADIOLOGY WITH ANESTHESIA N/A 02/04/2018   Procedure: Spinal biopsy;  Surgeon: Luanne Bras, MD;  Location: [facility];  Service: Radiology;  Laterality: N/A;  . TEE WITHOUT CARDIOVERSION N/A 08/30/2012   Procedure: TRANSESOPHAGEAL ECHOCARDIOGRAM (TEE);  Surgeon: Larey Dresser, MD;  Location: [facility];  Service: Cardiovascular;  Laterality: N/A;  . TONSILLECTOMY    . TOTAL HIP REVISION Right 09/02/2012   Procedure:  REVISION OF POLYETHYLENE LINER AND FEMORAL HEAD ;  Surgeon: Kerin Salen, MD;  Location: [facility];  Service: Orthopedics;  Laterality: Right;  . TOTAL HIP REVISION Right 11/28/2012   Procedure: TOTAL HIP REVISION With Placement of Contrain Liner;  Surgeon: Kerin Salen, MD;  Location: [facility];  Service: Orthopedics;  Laterality: Right;        Home Medications    Prior to Admission medications   Medication Sig Start Date End Date Taking? Authorizing Provider  acetaminophen (TYLENOL) 500 MG tablet Take 500 mg by mouth 3 (three) times daily.   Yes [provider]  Artificial Saliva (BIOTENE MOISTURIZING MOUTH MT) Use as directed 2 sprays in the mouth or throat every 6 (six) hours as needed (Dry mouth). Apply 2 sprays directly onto mouth and tongue. Spread thoroughly inside the mouth - every 6 hours as needed for dry mouth.    Yes [provider]  buPROPion (WELLBUTRIN SR) 100 MG 12 hr tablet Take 100 mg by mouth 2 (two)  times daily.   Yes [provider]  ceFAZolin (ANCEF) IVPB Inject 2 g into the vein every 8 (eight) hours. Indication:  MSSA epidural abscess and discitis/osteomyelitis Last Day of Therapy: 03/20/2018 Labs - Once weekly:  CBC/D and BMP, Labs - Every other week:  ESR and CRP 02/07/18 03/20/18 Yes Patrecia Pour, MD  divalproex (DEPAKOTE SPRINKLE) 125 MG capsule Take 125 mg by mouth at bedtime.   Yes [provider]  docusate sodium (COLACE) 100 MG capsule Take 100 mg by mouth 2 (two) times daily.    Yes [provider]  escitalopram (LEXAPRO) 10 MG tablet Take 10 mg by mouth daily.  10/17/17  Yes [provider]  ferrous sulfate 325 (65 FE) MG tablet Take 325 mg by mouth 2 (two) times daily with a meal.    Yes [provider]  finasteride (PROSCAR) 5 MG tablet Take 5 mg by mouth at bedtime.    Yes [provider]  lactulose (CHRONULAC) 10 GM/15ML solution Take 10-30 g by mouth See admin instructions. Take 45 mls (30 g)  by mouth every morning, take 15 mls (10 g) at bedtime. Hold for loose stool.   Yes [provider]  levothyroxine (SYNTHROID, LEVOTHROID) 88 MCG tablet Take 88 mcg by mouth daily before breakfast.   Yes [provider]  Lidocaine (ASPERCREME LIDOCAINE) 4 % PTCH Apply 3 patches topically See admin instructions. Apply one patch to left arm between shoulder and elbow every morning, remove every night (12 hrs later); apply 2nd patch between shoulder blades every morning, remove every night (12 hrs later); apply 3rd patch to right arm between shoulder and elbow every morning, remover every night (12 hrs later)   Yes [provider]  lovastatin (MEVACOR) 40 MG tablet Take 40 mg by mouth at bedtime.  07/09/14  Yes [provider]  nystatin (NYSTATIN) powder Apply topically See admin instructions. Apply powder to skin fold rash twice daily until resolved   Yes [provider]  pioglitazone (ACTOS) 15 MG tablet Take 15 mg by mouth daily.   Yes [provider]  pregabalin (LYRICA) 75 MG capsule Take 1 capsule (75 mg total) by mouth daily. Patient taking differently: Take 75 mg by mouth at bedtime.  02/08/18  Yes Patrecia Pour, MD  rivaroxaban (XARELTO) 20 MG TABS tablet Take 20 mg by mouth daily.   Yes [provider]  rOPINIRole (REQUIP) 0.5 MG tablet Take 0.5 mg by mouth at bedtime.  10/18/17  Yes [provider]  telmisartan (MICARDIS) 40 MG tablet Take 40 mg by mouth daily.   Yes [provider]  terazosin (HYTRIN) 5 MG capsule Take 5 mg by mouth at bedtime.  11/15/12  Yes [provider]  tiZANidine (ZANAFLEX) 2 MG tablet Take 2 mg by mouth 3 (three) times daily.  12/03/17  Yes [provider]  torsemide (DEMADEX) 10 MG tablet Take 10 mg by mouth daily.    Yes [provider]  vitamin C (ASCORBIC ACID) 500 MG tablet Take 500 mg by mouth 2 (two) times daily.   Yes [provider]    Family  History Family History  Problem Relation Age of Onset  . Heart failure Mother     Social History Social History   Tobacco Use  . Smoking status: Never Smoker  . Smokeless tobacco: Never Used  Substance Use Topics  . Alcohol use: No    Alcohol/week: 0.0 standard drinks  . Drug use: No  Allergies   Ace inhibitors; Gabapentin; Nsaids; and Zolpidem   Review of Systems Review of Systems  Unable to perform ROS: Dementia     Physical Exam Updated Vital Signs BP (!) 152/90   Pulse 76   Temp 98.2 F (36.8 C) (Rectal)   Resp 17   Ht 5' 11" (1.803 m)   Wt 111 kg   SpO2 93%   BMI 34.13 kg/m   Physical Exam Vitals signs and nursing note reviewed.  Constitutional:      Appearance: He is well-developed. He is obese.  HENT:     Head: Normocephalic and atraumatic.     Right Ear: External ear normal.     Left Ear: External ear normal.     Nose: Nose normal.  Eyes:     General:        Right eye: No discharge.        Left eye: No discharge.  Neck:     Musculoskeletal: Neck supple.  Cardiovascular:     Rate and Rhythm: Normal rate and regular rhythm.     Heart sounds: Normal heart sounds.  Pulmonary:     Effort: Pulmonary effort is normal.     Breath sounds: Normal breath sounds.  Abdominal:     Palpations: Abdomen is soft.     Tenderness: There is no abdominal tenderness.  Skin:    General: Skin is warm and dry.  Neurological:     Mental Status: He is alert.     Comments: Patient is awake, garbled speech. Follows commands.  Moves extremities spontaneously. Lower extremities 2/5 strength bilaterally.  Psychiatric:        Mood and Affect: Mood is not anxious.      ED Treatments / Results  Labs (all labs ordered are listed, but only abnormal results are displayed) Labs Reviewed  PROTIME-INR - Abnormal; Notable for the following components:      Result Value   Prothrombin Time 20.2 (*)    All other components within normal limits  APTT - Abnormal;  Notable for the following components:   aPTT 43 (*)    All other components within normal limits  CBC - Abnormal; Notable for the following components:   RBC 3.27 (*)    Hemoglobin 9.8 (*)    HCT 32.7 (*)    RDW 15.6 (*)    Platelets 129 (*)    All other components within normal limits  COMPREHENSIVE METABOLIC PANEL - Abnormal; Notable for the following components:   Total Protein 6.3 (*)    Albumin 2.9 (*)    All other components within normal limits  CULTURE, BLOOD (ROUTINE X 2)  CULTURE, BLOOD (ROUTINE X 2)  URINE CULTURE  DIFFERENTIAL  LACTIC ACID, PLASMA  URINALYSIS, ROUTINE W REFLEX MICROSCOPIC  TSH  CBG MONITORING, ED  I-STAT CREATININE, ED  I-STAT TROPONIN, ED    EKG EKG Interpretation  Date/Time:  Sunday March 06 2018 10:04:25 EST Ventricular Rate:  79 PR Interval:    QRS Duration: 107 QT Interval:  399 QTC Calculation: 458 R Axis:   18 Text Interpretation:  Atrial fibrillation Low voltage, extremity and precordial leads Probable anteroseptal infarct, old Borderline T abnormalities, inferior leads no significant change since Feb 16 2018 Confirmed by Sherwood Gambler [id number]) on 03/06/2018 10:12:28 AM   Radiology Mr Brain Wo Contrast  Result Date: 03/06/2018 CLINICAL DATA:  85 year old male code stroke presentation with left side facial droop weakness and slurred speech. EXAM: MRI HEAD WITHOUT CONTRAST TECHNIQUE: Multiplanar,  multiecho pulse sequences of the brain and surrounding structures were obtained without intravenous contrast. COMPARISON:  Head CT earlier today. Brain MRI 01/31/2018 and earlier. FINDINGS: Brain: Study is intermittently degraded by motion artifact despite repeated imaging attempts. No restricted diffusion to suggest acute infarction. No midline shift, mass effect, evidence of mass lesion, extra-axial collection or acute intracranial hemorrhage. Cervicomedullary junction and pituitary are within normal limits. Stable cerebral volume and ex vacuo  appearing ventricular enlargement. Scattered cerebral white matter T2 and FLAIR hyperintensity appears stable and is most pronounced in the external capsules. No new signal abnormality. No chronic cerebral blood products are evident Vascular: Major intracranial vascular flow voids are stable with mild to moderate generalized intracranial artery ectasia. Skull and upper cervical spine: Negative visible cervical spine. Normal bone marrow signal. Sinuses/Orbits: Stable, negative. Other: Stable trace mastoid fluid. Negative scalp and face soft tissues. IMPRESSION: 1.  No acute intracranial abnormality. 2. Stable MRI appearance of the brain since 2019. Electronically Signed   By: Genevie Ann M.D.   On: 03/06/2018 15:29   Dg Chest Portable 1 View  Result Date: 03/06/2018 CLINICAL DATA:  Possible stroke.  Found down. EXAM: PORTABLE CHEST 1 VIEW COMPARISON:  02/16/2018 FINDINGS: The heart is enlarged but stable. There is tortuosity and calcification of the thoracic aorta. The right PICC line is stable. Low lung volumes with vascular crowding and streaky basilar atelectasis. No definite pleural effusions. The bony thorax is intact. IMPRESSION: Cardiac enlargement. Low lung volumes with streaky basilar atelectasis but no definite infiltrates or large effusions. Electronically Signed   By: Marijo Sanes M.D.   On: 03/06/2018 10:30   Ct Head Code Stroke Wo Contrast  Result Date: 03/06/2018 CLINICAL DATA:  Code stroke. Left-sided facial droop, weakness, and slurred speech EXAM: CT HEAD WITHOUT CONTRAST TECHNIQUE: Contiguous axial images were obtained from the base of the skull through the vertex without intravenous contrast. COMPARISON:  02/16/2018 FINDINGS: Brain: No evidence of acute infarction, hemorrhage, hydrocephalus, extra-axial collection or mass lesion/mass effect. Remote right corona radiata infarct. Advanced atrophy with ventriculomegaly Vascular: No hyperdense vessel. Extensive atherosclerotic calcification. Skull:  No acute finding Sinuses/Orbits: Bilateral cataract resection Other: These results were communicated to Dr. Cheral Marker at 10:04 amon 2/9/2020by text page via the [facility] messaging system. ASPECTS [facility] Stroke Program Early CT Score) - Ganglionic level infarction (caudate, lentiform nuclei, internal capsule, insula, M1-M3 cortex): 7 - Supraganglionic infarction (M4-M6 cortex): 3 Total score (0-10 with 10 being normal): 10 IMPRESSION: 1. No acute finding or change from prior. 2. Advanced atrophy. Electronically Signed   By: Monte Fantasia M.D.   On: 03/06/2018 10:05    Procedures Procedures (including critical care time)  Medications Ordered in ED Medications  sodium chloride flush (NS) 0.9 % injection 3 mL (3 mLs Intravenous Given 03/06/18 1046)  LORazepam (ATIVAN) injection 0.5 mg (0.5 mg Intravenous Given 03/06/18 1435)     Initial Impression / Assessment and Plan / ED Course  I have reviewed the triage vital signs and the nursing notes.  Pertinent labs & imaging results that were available during my care of the patient were reviewed by me and considered in my medical decision making (see chart for details).     Patient presents as a code stroke but after initial CT/work-up and talking to daughters, this is been subacute and then much more acute over the last few days.  The altered mental status of unclear etiology.  He is not quite himself and has a hard time feeding himself, despite  being at the nursing facility.  Given all this, I think you will need admission.  I have ordered MRIs but I do not think they will change whether or not he needs to come into the hospital.  He will need MRI of the brain given the acute strokelike symptoms and mental status change, but also T and L-spine because of the history of infection.  At this time, no fevers and his labs are reassuring.  I think if there are findings on the MRIs then antibiotics will need to be broadened, otherwise we will hold on antibiotics for  now.  Discussed with Dr. Sloan Leiter who will admit.  Final Clinical Impressions(s) / ED Diagnoses   Final diagnoses:  Altered mental status, unspecified altered mental status type    ED Discharge Orders    None       Sherwood Gambler, MD 03/06/18 1558

## 2018-03-06 NOTE — Consult Note (Addendum)
NEURO HOSPITALIST  CONSULT   Requesting Physician: Dr. Regenia Skeeter    Chief Complaint: Left sided weakness, left facial droop and slurred speech   History obtained from:  Patient and EMS  HPI:                                                                                                                                         [name] is an 85 y.o. male with a PMHx of HTN, hypothyroidism, chronic a. Fib ( on xarelto), DM, CKD 3, lumbar spine osteomyelitis (on long term abx with PICC line at this time) who presented to [facility] as a code stroke from [facility].  Patient was last seen at 0630 when he was completely normal per staff. They went in to give him his antibiotics. At [place] when they went back in his room he was altered with left facial droop and left sided weakness. He denies any vision changes, HA, SOB, or CP. Per facility the patient is bed bound. Patient currently being treated with IV cefazolin for lumbar osteomyelitis. Has a PICC line in place.   ED course:  CTH: no hemorrhage BG: 85   Date last known well:03/06/2018 Time last known well:0630 tPA Given: No: on Xarelto Modified Rankin: Rankin Score=4 Not an endovascular candidate due to mRS > 2 NIHSS: 11    Past Medical History:  Diagnosis Date  . Alzheimer's type dementia ([place]) 12/07/2016  . Anemia   . Anxiety   . Arthritis   . Bacteremia   . Bradycardia    a. PPM removed in 2014 due to bacteremia.  . Chronic atrial fibrillation   . Chronic right shoulder pain 06/17/2015  . CKD (chronic kidney disease), stage III ([place])   . Complication of anesthesia    " Terrible Nightmares"  . Constipation   . Depression   . Diabetes mellitus without complication ([place])    Type II  . DVT (deep venous thrombosis) ([place])    a. in 2019 - pt reports behind knee - was on Xarelto 37m BID now 286mdaily.  . Encounter for removal of cardiac resynchronization therapy pacemaker 2017  .  Grief 06/17/2015  . History of blood product transfusion   . Hypertension   . Hypothyroidism   . Infected pacemaker ([place]  . Major depression 06/17/2015  . MSSA (methicillin susceptible Staphylococcus aureus) infection   . OAB (overactive bladder)   . Overactive bladder   . Septic hip ([place]  . Septicemia ([place]  . Severe depression ([place]11/12/2016  . Staphylococcus aureus infection   . Thoracic spine pain 11/28/2014    Past  Surgical History:  Procedure Laterality Date  . COLONOSCOPY W/ POLYPECTOMY     small  . EYE SURGERY     bilateral  . HIP CLOSED REDUCTION Right 10/02/2012   Procedure: CLOSED REDUCTION HIP;  Surgeon: Hessie Dibble, MD;  Location: [place];  Service: Orthopedics;  Laterality: Right;  . ICD LEAD REMOVAL Left 08/31/2012   Procedure: ICD LEAD REMOVAL;  Surgeon: Evans Lance, MD;  Location: [place];  Service: Cardiovascular;  Laterality: Left;  . INCISION AND DRAINAGE HIP Right 09/02/2012   Procedure: IRRIGATION AND DEBRIDEMENT RIGHT HIP;  Surgeon: Kerin Salen, MD;  Location: [place];  Service: Orthopedics;  Laterality: Right;  . INCISION AND DRAINAGE HIP Right 08/10/2014   Procedure: IRRIGATION AND DEBRIDEMENT RIGHT HIP SUPERFICIAL VS DEEP;  Surgeon: Frederik Pear, MD;  Location: [place];  Service: Orthopedics;  Laterality: Right;  . INSERT / REPLACE / REMOVE PACEMAKER     removed in 2017 d/t sepsis/notes 11/03/2017  . IR FLUORO GUIDED NEEDLE PLC ASPIRATION/INJECTION LOC  02/04/2018  . IR FLUORO GUIDED NEEDLE PLC ASPIRATION/INJECTION LOC  02/04/2018  . JOINT REPLACEMENT    . LUMBAR LAMINECTOMY/DECOMPRESSION MICRODISCECTOMY  02/14/2012   Procedure: LUMBAR LAMINECTOMY/DECOMPRESSION MICRODISCECTOMY 1 LEVEL;  Surgeon: Eustace Moore, MD;  Location: [place] NEURO ORS;  Service: Neurosurgery;  Laterality: N/A;  Thoracic twelve - Lumbar one decompressive laminectomy.  Marland Kitchen RADIOLOGY WITH ANESTHESIA N/A 01/10/2015   Procedure: MRI LUMBAR SPINE WITH/WITHOUT   (RADIOLOGY WITH ANESTHESIA);   Surgeon: Medication Radiologist, MD;  Location: [place];  Service: Radiology;  Laterality: N/A;  . RADIOLOGY WITH ANESTHESIA N/A 02/04/2018   Procedure: Spinal biopsy;  Surgeon: Luanne Bras, MD;  Location: [place];  Service: Radiology;  Laterality: N/A;  . TEE WITHOUT CARDIOVERSION N/A 08/30/2012   Procedure: TRANSESOPHAGEAL ECHOCARDIOGRAM (TEE);  Surgeon: Larey Dresser, MD;  Location: [place];  Service: Cardiovascular;  Laterality: N/A;  . TONSILLECTOMY    . TOTAL HIP REVISION Right 09/02/2012   Procedure: REVISION OF POLYETHYLENE LINER AND FEMORAL HEAD ;  Surgeon: Kerin Salen, MD;  Location: [place];  Service: Orthopedics;  Laterality: Right;  . TOTAL HIP REVISION Right 11/28/2012   Procedure: TOTAL HIP REVISION With Placement of Contrain Liner;  Surgeon: Kerin Salen, MD;  Location: [place];  Service: Orthopedics;  Laterality: Right;    Family History  Problem Relation Age of Onset  . Heart failure Mother          Social History:  reports that he has never smoked. He has never used smokeless tobacco. He reports that he does not drink alcohol or use drugs.  Allergies:  Allergies  Allergen Reactions  . Ace Inhibitors Cough  . Gabapentin Other (See Comments)    Unknown reaction  . Nsaids Other (See Comments)    Renal insufficiency   . Zolpidem Other (See Comments)    hallucinations Nightmares, irritable, nervous Disorientation    Medications:  Current Facility-Administered Medications  Medication Dose Route Frequency Provider Last Rate Last Dose  . sodium chloride flush (NS) 0.9 % injection 3 mL  3 mL Intravenous Once Sherwood Gambler, MD       Current Outpatient Medications  Medication Sig Dispense Refill  . acetaminophen (TYLENOL) 500 MG tablet Take 500 mg by mouth 3 (three) times daily.    . Artificial Saliva (BIOTENE MOISTURIZING MOUTH MT) Use as  directed 2 sprays in the mouth or throat See admin instructions. Apply 2 sprays directly onto mouth and tongue. Spread thoroughly inside the mouth - every 6 hours as needed for dry mouth.    Marland Kitchen buPROPion (WELLBUTRIN SR) 100 MG 12 hr tablet Take 100 mg by mouth 2 (two) times daily.    Marland Kitchen ceFAZolin (ANCEF) IVPB Inject 2 g into the vein every 8 (eight) hours. Indication:  MSSA epidural abscess and discitis/osteomyelitis Last Day of Therapy: 03/20/2018 Labs - Once weekly:  CBC/D and BMP, Labs - Every other week:  ESR and CRP 123 Units 0  . divalproex (DEPAKOTE SPRINKLE) 125 MG capsule Take 125 mg by mouth at bedtime.    . docusate sodium (COLACE) 100 MG capsule Take 100 mg by mouth 2 (two) times daily.     Marland Kitchen escitalopram (LEXAPRO) 10 MG tablet Take 10 mg by mouth daily.     . ferrous sulfate 325 (65 FE) MG tablet Take 325 mg by mouth 2 (two) times daily with a meal.     . finasteride (PROSCAR) 5 MG tablet Take 5 mg by mouth at bedtime.     Marland Kitchen lactulose (CHRONULAC) 10 GM/15ML solution Take 10-30 g by mouth See admin instructions. Take 45 mls (30 g) by mouth every morning, take 15 mls (10 g) at bedtime. Hold for loose stool.    Marland Kitchen levothyroxine (SYNTHROID, LEVOTHROID) 88 MCG tablet Take 88 mcg by mouth daily before breakfast.    . Lidocaine (ASPERCREME LIDOCAINE) 4 % PTCH Apply 3 patches topically See admin instructions. Apply one patch to left arm between shoulder and elbow every morning, remove every night (12 hrs later); apply 2nd patch between shoulder blades every morning, remove every night (12 hrs later); apply 3rd patch to right arm between shoulder and elbow every morning, remover every night (12 hrs later)    . lovastatin (MEVACOR) 40 MG tablet Take 40 mg by mouth at bedtime.     Marland Kitchen nystatin (NYSTATIN) powder Apply topically See admin instructions. Apply powder to skin fold rash twice daily until resolved    . pioglitazone (ACTOS) 15 MG tablet Take 15 mg by mouth daily.    . pregabalin (LYRICA) 75 MG  capsule Take 1 capsule (75 mg total) by mouth daily. 5 capsule 0  . rivaroxaban (XARELTO) 20 MG TABS tablet Take 20 mg by mouth daily.    Marland Kitchen rOPINIRole (REQUIP) 0.5 MG tablet Take 0.5 mg by mouth at bedtime.     Marland Kitchen telmisartan (MICARDIS) 40 MG tablet Take 40 mg by mouth daily.    Marland Kitchen terazosin (HYTRIN) 5 MG capsule Take 5 mg by mouth at bedtime.     Marland Kitchen tiZANidine (ZANAFLEX) 2 MG tablet Take 2 mg by mouth 3 (three) times daily.     Marland Kitchen torsemide (DEMADEX) 10 MG tablet Take 10 mg by mouth daily.     . vitamin C (ASCORBIC ACID) 500 MG tablet Take 500 mg by mouth 2 (two) times daily.       ROS:  unobtainable from patient due to mental status  General Examination:                                                                                                      There were no vitals taken for this visit.  HEENT-  Normocephalic, no lesions, without obvious abnormality.  Normal external eye and conjunctiva. Lungs- Respirations unlabored Extremities- Mild edema distal lower extremities.  Skin-warm and dry, pressure relief dressing on right heel. Bilateral praffo boots.  Neurological Examination Mental Status: Drowsy, requires repeated stimulation to attend to examination. When awake, he is agitated. Appears confused. Limited speech output is with dysarthria but fluent. Able to follow simple commands.  Cranial Nerves: II: Visual fields grossly normal. PERRL.  III,IV, VI: ptosis not present, EOMI  V,VII: Left facial droop. Facial light touch sensation intact bilaterally. Tremor of perioral musculature noted intermittently. VIII: hearing intact to voice IX,X: Unable to visualize palate XI: Head is midline XII: midline tongue extension Motor: Right : Upper extremity   3/5  Left:     Upper extremity   3/5   Lower extremity   2/5   Lower extremity   2/5 Does not follow  commands consistently. Will flail upper extremities at times when he is agitated.  Sensory:  Light touch intact throughout, bilaterally Deep Tendon Reflexes: absent knee jerks bilaterally  Plantars: Right: downgoing   Left: downgoing Cerebellar: No ataxia with finger-to-nose, unable to perform HTS Gait: Unable to assess   Lab Results: Basic Metabolic Panel: Recent Labs  Lab 03/06/18 0952  CREATININE 0.90    CBC: Recent Labs  Lab 03/06/18 0953  WBC 5.8  NEUTROABS 3.8  HGB 9.8*  HCT 32.7*  MCV 100.0  PLT 129*   CBG: Recent Labs  Lab 03/06/18 0947  GLUCAP 83    Imaging: Ct Head Code Stroke Wo Contrast  Result Date: 03/06/2018 CLINICAL DATA:  Code stroke. Left-sided facial droop, weakness, and slurred speech EXAM: CT HEAD WITHOUT CONTRAST TECHNIQUE: Contiguous axial images were obtained from the base of the skull through the vertex without intravenous contrast. COMPARISON:  02/16/2018 FINDINGS: Brain: No evidence of acute infarction, hemorrhage, hydrocephalus, extra-axial collection or mass lesion/mass effect. Remote right corona radiata infarct. Advanced atrophy with ventriculomegaly Vascular: No hyperdense vessel. Extensive atherosclerotic calcification. Skull: No acute finding Sinuses/Orbits: Bilateral cataract resection Other: These results were communicated to Dr. Cheral Marker at 10:04 amon 2/9/2020by text page via the [facility] messaging system. ASPECTS [facility] Stroke Program Early CT Score) - Ganglionic level infarction (caudate, lentiform nuclei, internal capsule, insula, M1-M3 cortex): 7 - Supraganglionic infarction (M4-M6 cortex): 3 Total score (0-10 with 10 being normal): 10 IMPRESSION: 1. No acute finding or change from prior. 2. Advanced atrophy. Electronically Signed   By: Monte Fantasia M.D.   On: 03/06/2018 10:05     Laurey Morale, MSN, NP-C [facility] [phone] 03/06/2018, 9:56 AM   Assessment: 85 y.o. male  With PMH HTN, hypothyroidism, chronic a.  Fib ( on Xarelto), DM, CKD 3, osteomyelitis (on long term abx with PICC line at this time) who presented to  [facility] as a code stroke from [facility].  1. CTH: no hemorrhage.  2. tPA contraindicated as patient is anticoagulated with Xarelto.  3. Not an endovascular candidate due to modified Rankin scale of 4 (bed bound at baseline). 4. Examination without clear lateralizing features. Suspect possible AMS due to his osteomyelitis, with associated diffuse weakness as an alternate explanation for his presentation. Per family, his mental status has precipitously worsened over the past 4 days.  5. Stroke Risk Factors - atrial fibrillation, hyperlipidemia and hypertension   Recommendations: --MRI Brain with MRA of head. If positive for stroke, then obtain TTE and carotid ultrasound --Continue Xarelto --Benefits of statin most likely outweighed by risks given the patient's advanced age --Modified permissive HTN protocol given advanced age: Treat if SBP is > 180 -- HgbA1c, fasting lipid panel -- PT consult, OT consult, Speech consult --Telemetry monitoring --Frequent neuro checks --Stroke swallow screen  --Osteomyelitis management as per primary team --Please page stroke NP  Or  PA  Or MD from 8am -4 pm  as this patient from this time will be  followed by the stroke.   You can look them up on [email or web address]  Password [email or web address]  I have seen and evaluated the patient. I have formulated the assessment and plan. 85 year old male presenting from [facility] with acute left sided weakness with facial droop and AMS. Exam shows no definite lateralized weakness. CT head negative for hemorrhage. DDx includes TIA and encephalopathy secondary to intercurrent infection. Will obtain MRI/MRA to assess for possible stroke.  Electronically signed: Dr. Kerney Elbe

## 2018-03-06 NOTE — Progress Notes (Signed)
Came to room to obtain ABG.  Daughter at bedside and questioning need for ABG and is requesting that the ordering MD be paged to clarify.  MD paged, awaiting return call.  ED RN and RT aware.

## 2018-03-07 ENCOUNTER — Inpatient Hospital Stay (HOSPITAL_COMMUNITY): Payer: Medicare Other

## 2018-03-07 ENCOUNTER — Encounter (HOSPITAL_COMMUNITY): Payer: Self-pay | Admitting: Radiology

## 2018-03-07 DIAGNOSIS — G8220 Paraplegia, unspecified: Secondary | ICD-10-CM

## 2018-03-07 DIAGNOSIS — Z96641 Presence of right artificial hip joint: Secondary | ICD-10-CM

## 2018-03-07 DIAGNOSIS — Z888 Allergy status to other drugs, medicaments and biological substances status: Secondary | ICD-10-CM

## 2018-03-07 DIAGNOSIS — Z8619 Personal history of other infectious and parasitic diseases: Secondary | ICD-10-CM

## 2018-03-07 DIAGNOSIS — M4645 Discitis, unspecified, thoracolumbar region: Secondary | ICD-10-CM

## 2018-03-07 DIAGNOSIS — G9520 Unspecified cord compression: Secondary | ICD-10-CM

## 2018-03-07 DIAGNOSIS — G061 Intraspinal abscess and granuloma: Principal | ICD-10-CM

## 2018-03-07 DIAGNOSIS — M4625 Osteomyelitis of vertebra, thoracolumbar region: Secondary | ICD-10-CM

## 2018-03-07 DIAGNOSIS — Z950 Presence of cardiac pacemaker: Secondary | ICD-10-CM

## 2018-03-07 DIAGNOSIS — Z886 Allergy status to analgesic agent status: Secondary | ICD-10-CM

## 2018-03-07 DIAGNOSIS — G9340 Encephalopathy, unspecified: Secondary | ICD-10-CM

## 2018-03-07 LAB — BASIC METABOLIC PANEL
Anion gap: 8 (ref 5–15)
BUN: 11 mg/dL (ref 8–23)
CO2: 27 mmol/L (ref 22–32)
Calcium: 8.7 mg/dL — ABNORMAL LOW (ref 8.9–10.3)
Chloride: 109 mmol/L (ref 98–111)
Creatinine, Ser: 1.12 mg/dL (ref 0.61–1.24)
GFR calc Af Amer: >60 mL/min (ref >60)
GFR calc non Af Amer: 60 mL/min — ABNORMAL LOW (ref >60)
Glucose, Bld: 75 mg/dL (ref 70–99)
Potassium: 4 mmol/L (ref 3.5–5.1)
Sodium: 144 mmol/L (ref 135–145)

## 2018-03-07 LAB — URINE CULTURE: Culture: <10000 — AB

## 2018-03-07 LAB — FUNGUS CULTURE WITH STAIN

## 2018-03-07 LAB — CBC
HCT: 36.5 % — ABNORMAL LOW (ref 39.0–52.0)
HEMOGLOBIN: 11 g/dL — AB (ref 13.0–17.0)
MCH: 30.2 pg (ref 26.0–34.0)
MCHC: 30.1 g/dL (ref 30.0–36.0)
MCV: 100.3 fL — ABNORMAL HIGH (ref 80.0–100.0)
Platelets: 126 10*3/uL — ABNORMAL LOW (ref 150–400)
RBC: 3.64 MIL/uL — ABNORMAL LOW (ref 4.22–5.81)
RDW: 15.4 % (ref 11.5–15.5)
WBC: 5.4 10*3/uL (ref 4.0–10.5)
nRBC: 0 % (ref 0.0–0.2)

## 2018-03-07 LAB — GLUCOSE, CAPILLARY
Glucose-Capillary: 69 mg/dL — ABNORMAL LOW (ref 70–99)
Glucose-Capillary: 97 mg/dL (ref 70–99)
Glucose-Capillary: 80 mg/dL (ref 70–99)

## 2018-03-07 LAB — MRSA PCR SCREENING: MRSA by PCR: NEGATIVE

## 2018-03-07 LAB — FUNGAL ORGANISM REFLEX

## 2018-03-07 LAB — FUNGUS CULTURE RESULT

## 2018-03-07 MED ORDER — NAFCILLIN SODIUM 2 G IJ SOLR
2.0000 g | INTRAMUSCULAR | Status: DC
  Filled 2018-03-07: qty 2000

## 2018-03-07 MED ORDER — IOPAMIDOL (ISOVUE-370) INJECTION 76%
INTRAVENOUS | Status: AC
  Filled 2018-03-07: qty 50

## 2018-03-07 MED ORDER — IOPAMIDOL (ISOVUE-370) INJECTION 76%
50.0000 mL | Freq: Once | INTRAVENOUS | Status: AC | PRN
  Administered 2018-03-07: 16:00:00 via INTRAVENOUS

## 2018-03-07 MED ORDER — SODIUM CHLORIDE 0.9 % IV SOLN
2.0000 g | INTRAVENOUS | Status: DC
  Administered 2018-03-07 – 2018-03-09 (×12): 2 g via INTRAVENOUS
  Filled 2018-03-07 (×16): qty 2000

## 2018-03-07 MED ORDER — HYDROMORPHONE HCL 1 MG/ML IJ SOLN: 0.5000 mg | INTRAMUSCULAR | Status: DC | PRN

## 2018-03-07 MED ORDER — RIVAROXABAN 20 MG PO TABS
20.0000 mg | ORAL_TABLET | Freq: Every day | ORAL | Status: DC
  Administered 2018-03-07 – 2018-03-09 (×3): 20 mg via ORAL
  Filled 2018-03-07 (×3): qty 1

## 2018-03-07 MED ORDER — IOPAMIDOL (ISOVUE-370) INJECTION 76%
50.0000 mL | Freq: Once | INTRAVENOUS | Status: AC | PRN
  Administered 2018-03-07: 50 mL via INTRAVENOUS

## 2018-03-07 MED ORDER — IOPAMIDOL (ISOVUE-370) INJECTION 76%
75.0000 mL | Freq: Once | INTRAVENOUS | Status: AC | PRN
  Administered 2018-03-07: 75 mL via INTRAVENOUS

## 2018-03-07 NOTE — Evaluation (Signed)
Clinical/Bedside Swallow Evaluation Patient Details  Name: [name] MRN: [id number] Date of Birth: 1930-10-13  Today's Date: 03/07/2018 Time: SLP Start Time (ACUTE ONLY): 0741 SLP Stop Time (ACUTE ONLY): 0801 SLP Time Calculation (min) (ACUTE ONLY): 20 min  Past Medical History:  Past Medical History:  Diagnosis Date  . Alzheimer's type dementia ([facility]) 12/07/2016  . Anemia   . Anxiety   . Arthritis   . Bacteremia   . Bradycardia    a. PPM removed in 2014 due to bacteremia.  . Chronic atrial fibrillation   . Chronic right shoulder pain 06/17/2015  . CKD (chronic kidney disease), stage III ([facility])   . Complication of anesthesia    " Terrible Nightmares"  . Constipation   . Depression   . Diabetes mellitus without complication ([facility])    Type II  . DVT (deep venous thrombosis) ([facility])    a. in 2019 - pt reports behind knee - was on Xarelto 15mg  BID now 20mg  daily.  . Encounter for removal of cardiac resynchronization therapy pacemaker 2017  . Grief 06/17/2015  . History of blood product transfusion   . Hypertension   . Hypothyroidism   . Infected pacemaker ([facility])   . Major depression 06/17/2015  . MSSA (methicillin susceptible Staphylococcus aureus) infection   . OAB (overactive bladder)   . Overactive bladder   . Septic hip ([facility])   . Septicemia ([facility])   . Severe depression ([facility]) 12/07/2016  . Staphylococcus aureus infection   . Thoracic spine pain 11/28/2014   Past Surgical History:  Past Surgical History:  Procedure Laterality Date  . COLONOSCOPY W/ POLYPECTOMY     small  . EYE SURGERY     bilateral  . HIP CLOSED REDUCTION Right 10/02/2012   Procedure: CLOSED REDUCTION HIP;  Surgeon: Velna Ochs, MD;  [facility];  Service: Orthopedics;  Laterality: Right;  . ICD LEAD REMOVAL Left 08/31/2012   Procedure: ICD LEAD REMOVAL;  Surgeon: Marinus Maw, MD;  [facility];  Service: Cardiovascular;  Laterality: Left;  . INCISION AND DRAINAGE HIP Right 09/02/2012    Procedure: IRRIGATION AND DEBRIDEMENT RIGHT HIP;  Surgeon: Nestor Lewandowsky, MD;  [facility];  Service: Orthopedics;  Laterality: Right;  . INCISION AND DRAINAGE HIP Right 08/10/2014   Procedure: IRRIGATION AND DEBRIDEMENT RIGHT HIP SUPERFICIAL VS DEEP;  Surgeon: Gean Birchwood, MD;  [facility];  Service: Orthopedics;  Laterality: Right;  . INSERT / REPLACE / REMOVE PACEMAKER     removed in 2017 d/t sepsis/notes 11/03/2017  . IR FLUORO GUIDED NEEDLE PLC ASPIRATION/INJECTION LOC  02/04/2018  . IR FLUORO GUIDED NEEDLE PLC ASPIRATION/INJECTION LOC  02/04/2018  . JOINT REPLACEMENT    . LUMBAR LAMINECTOMY/DECOMPRESSION MICRODISCECTOMY  02/14/2012   Procedure: LUMBAR LAMINECTOMY/DECOMPRESSION MICRODISCECTOMY 1 LEVEL;  Surgeon: Tia Alert, MD;  [facility];  Service: Neurosurgery;  Laterality: N/A;  Thoracic twelve - Lumbar one decompressive laminectomy.  Marland Kitchen RADIOLOGY WITH ANESTHESIA N/A 01/10/2015   Procedure: MRI LUMBAR SPINE WITH/WITHOUT   (RADIOLOGY WITH ANESTHESIA);  Surgeon: Medication Radiologist, MD;  [facility];  Service: Radiology;  Laterality: N/A;  . RADIOLOGY WITH ANESTHESIA N/A 02/04/2018   Procedure: Spinal biopsy;  Surgeon: Julieanne Cotton, MD;  [facility];  Service: Radiology;  Laterality: N/A;  . TEE WITHOUT CARDIOVERSION N/A 08/30/2012   Procedure: TRANSESOPHAGEAL ECHOCARDIOGRAM (TEE);  Surgeon: Laurey Morale, MD;  [facility] END[facility]COPY;  Service: Cardiovascular;  Laterality: N/A;  . TONSILLECTOMY    .  TOTAL HIP REVISION Right 09/02/2012   Procedure: REVISION OF POLYETHYLENE LINER AND FEMORAL HEAD ;  Surgeon: Nestor Lewandowsky, MD;  [facility];  Service: Orthopedics;  Laterality: Right;  . TOTAL HIP REVISION Right 11/28/2012   Procedure: TOTAL HIP REVISION With Placement of Contrain Liner;  Surgeon: Nestor Lewandowsky, MD;  [facility];  Service: Orthopedics;  Laterality: Right;   HPI:  [name] is a 85 y.o. male with medical history significant of  recurrent right hip MSSA infection, Alzheimer's dementia, pacemaker removal, hypertension, hyperlipidemia, type 2 diabetes, DVT, stage III CKD, , recently diagnosis of vertebral osteomyelitis and phlegmon. Pt from a skilled nursing facility with episodic confusion, progressive weakness and left facial droop. MRI no acute intracranial abnormality. MRI thoracic spine and lumbar spine showed persistent and slightly worse vertebral infection, T7-T12 mild cord compression.  CXRlLow lung volumes with streaky basilar atelectasis but no definite infiltrates or large effusions.   Assessment / Plan / Recommendation Clinical Impression  Pt appears to have suspected esophageal dysphagia due to clinical observations and history of GERD. He describes difficulty when eating cornflakes likely from dual consistency. Observations include immediate throat clear during 3 oz water test, delayed throat clears and reports of globus sensation in chest. Oral motor examination unremarkable with upper denture plate donned following oral care. Adequate oral phase with all textures. Recommend regular texture, thin liquids, pills whole in applesauce (baseline) and remain upright minimum 30 minutes if able. Educated pt on swallow precautions (dementia dx but appeared able to retain some information during evaluation). No follow up needed. SLP Visit Diagnosis: Dysphagia, unspecified (R13.10)    Aspiration Risk  Mild aspiration risk;Moderate aspiration risk    Diet Recommendation Regular;Thin liquid   Liquid Administration via: Cup;Straw Medication Administration: Whole meds with puree Supervision: Patient able to self feed;Staff to assist with self feeding(if needed) Compensations: Slow rate;Small sips/bites;Minimize environmental distractions Postural Changes: Seated upright at 90 degrees;Remain upright for at least 30 minutes after po intake    Other  Recommendations Oral Care Recommendations: Oral care BID   Follow up  Recommendations None      Frequency and Duration            Prognosis        Swallow Study   General HPI: [name] is a 85 y.o. male with medical history significant of recurrent right hip MSSA infection, Alzheimer's dementia, pacemaker removal, hypertension, hyperlipidemia, type 2 diabetes, DVT, stage III CKD, , recently diagnosis of vertebral osteomyelitis and phlegmon. Pt from a skilled nursing facility with episodic confusion, progressive weakness and left facial droop. MRI no acute intracranial abnormality. MRI thoracic spine and lumbar spine showed persistent and slightly worse vertebral infection, T7-T12 mild cord compression.  CXRlLow lung volumes with streaky basilar atelectasis but no definite infiltrates or large effusions. Type of Study: Bedside Swallow Evaluation Previous Swallow Assessment: (none) Diet Prior to this Study: NPO Temperature Spikes Noted: No Respiratory Status: Nasal cannula History of Recent Intubation: No Behavior/Cognition: Alert;Cooperative;Pleasant mood Oral Cavity Assessment: Dry Oral Care Completed by SLP: Yes Oral Cavity - Dentition: Dentures, top(natural lower) Vision: Functional for self-feeding Self-Feeding Abilities: Needs set up Patient Positioning: Upright in bed Baseline Vocal Quality: Normal;Other (comment)(mildly "gravely" quality) Volitional Cough: Strong Volitional Swallow: Able to elicit    Oral/Motor/Sensory Function Overall Oral Motor/Sensory Function: Within functional limits   Ice Chips Ice chips: Not tested   Thin Liquid Thin Liquid: Impaired Presentation: Cup;Straw Pharyngeal  Phase Impairments: Throat Clearing - Immediate;Throat Clearing -  Delayed    Nectar Thick Nectar Thick Liquid: Not tested   Honey Thick Honey Thick Liquid: Not tested   Puree Puree: Within functional limits   Solid     Solid: Within functional limits      Royce MacadamiaLitaker, Maddax Palinkas Willis 03/07/2018,8:16 AM  Breck CoonsLisa Willis Lonell FaceLitaker M.Ed  Nurse, children'sCCC-SLP Speech-Language Pathologist Pager [phone] Office [phone]

## 2018-03-07 NOTE — Progress Notes (Addendum)
STROKE TEAM PROGRESS NOTE   INTERVAL HISTORY His daughter and attending MD are at the bedside.  Discussed back infection, worsening but not a surgical candidate. Also that MRI was neg for stroke with neurology learning toward Lewy Body Dementia as our working dx for recurrent stroke-like events with no stroke.   Vitals:   03/06/18 2300 03/07/18 0313 03/07/18 0400 03/07/18 0731  BP: (!) 175/95  (!) 169/107 (!) 160/105  Pulse: 77 83 78 89  Resp: 17 20 19  (!) 22  Temp: 98.5 F (36.9 C) 98.7 F (37.1 C)  97.8 F (36.6 C)  TempSrc: Axillary Axillary  Oral  SpO2: 99% 100% 99% 100%  Weight:      Height:        CBC:  Recent Labs  Lab 03/06/18 0953 03/07/18 0443  WBC 5.8 5.4  NEUTROABS 3.8  --   HGB 9.8* 11.0*  HCT 32.7* 36.5*  MCV 100.0 100.3*  PLT 129* 126*    Basic Metabolic Panel:  Recent Labs  Lab 03/06/18 0953 03/07/18 0443  NA 142 144  K 4.1 4.0  CL 106 109  CO2 27 27  GLUCOSE 91 75  BUN 9 11  CREATININE 1.00 1.12  CALCIUM 8.9 8.7*   Lipid Panel:     Component Value Date/Time   CHOL 125 11/04/2017 0521   TRIG 40 11/04/2017 0521   HDL 33 (L) 11/04/2017 0521   CHOLHDL 3.8 11/04/2017 0521   VLDL 8 11/04/2017 0521   LDLCALC 84 11/04/2017 0521   HgbA1c:  Lab Results  Component Value Date   HGBA1C 5.6 11/03/2017   Urine Drug Screen:     Component Value Date/Time   LABOPIA POSITIVE (A) 11/03/2017 0341   COCAINSCRNUR NONE DETECTED 11/03/2017 0341   LABBENZ NONE DETECTED 11/03/2017 0341   AMPHETMU NONE DETECTED 11/03/2017 0341   THCU NONE DETECTED 11/03/2017 0341   LABBARB NONE DETECTED 11/03/2017 0341    Alcohol Level     Component Value Date/Time   ETH <10 11/03/2017 1715    IMAGING Mr Brain Wo Contrast  Result Date: 03/06/2018 CLINICAL DATA:  85 year old male code stroke presentation with left side facial droop weakness and slurred speech. EXAM: MRI HEAD WITHOUT CONTRAST TECHNIQUE: Multiplanar, multiecho pulse sequences of the brain and  surrounding structures were obtained without intravenous contrast. COMPARISON:  Head CT earlier today. Brain MRI 01/31/2018 and earlier. FINDINGS: Brain: Study is intermittently degraded by motion artifact despite repeated imaging attempts. No restricted diffusion to suggest acute infarction. No midline shift, mass effect, evidence of mass lesion, extra-axial collection or acute intracranial hemorrhage. Cervicomedullary junction and pituitary are within normal limits. Stable cerebral volume and ex vacuo appearing ventricular enlargement. Scattered cerebral white matter T2 and FLAIR hyperintensity appears stable and is most pronounced in the external capsules. No new signal abnormality. No chronic cerebral blood products are evident Vascular: Major intracranial vascular flow voids are stable with mild to moderate generalized intracranial artery ectasia. Skull and upper cervical spine: Negative visible cervical spine. Normal bone marrow signal. Sinuses/Orbits: Stable, negative. Other: Stable trace mastoid fluid. Negative scalp and face soft tissues. IMPRESSION: 1.  No acute intracranial abnormality. 2. Stable MRI appearance of the brain since 2019. Electronically Signed   By: Odessa Fleming M.D.   On: 03/06/2018 15:29   Mr Thoracic Spine W Wo Contrast  Addendum Date: 03/06/2018   ADDENDUM REPORT: 03/06/2018 16:41 ADDENDUM: Study discussed by telephone with Dr. Bruce Donath in the ED on 03/06/2018 at 1635 hours. Electronically Signed  By: Odessa Fleming M.D.   On: 03/06/2018 16:41   Result Date: 03/06/2018 CLINICAL DATA:  85 year old male with back pain, possible cauda equina syndrome. History of discitis osteomyelitis and phlegmon in the lower thoracic spine last month T7 through T10. Possible early discitis osteomyelitis at L4-L5 and L5-S1 at that time. 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 EXAM: MRI THORACIC WITHOUT AND WITH CONTRAST TECHNIQUE: Multiplanar and multiecho pulse sequences of the thoracic spine were obtained  without and with intravenous contrast. CONTRAST:  10 milliliters Gadavist in conjunction with contrast enhanced imaging of the lumbar spine reported separately. COMPARISON:  Lumbar spine MRI today reported separately. Thoracic MRI 01/31/2018. FINDINGS: Limited cervical spine imaging: Stable since January. Intermittent cervical spine ankylosis. Thoracic spine segmentation: Normal, concordant with the numbering in January and on the lumbar MRI today. Alignment: Stable since January. Straightening of lower thoracic kyphosis. Vertebrae: Regressed but not resolved marrow edema in the T8 vertebral body. Stable to slightly regressed marrow edema in T9 and T10. No new levels of edema. Cord: Abnormal spinal cord signal beginning at the T8 level and continuing caudally with increased cord T2 and STIR signal (series 19, image 9). This continues into the lower thoracic cord and conus and is more apparent than on the lumbar images today. No abnormal cord enhancement is identified. But there is abnormal dural thickening and enhancement from T7-T8 through L1 which has not improved since January. There is multifactorial spinal cord compression at T9-T10 (series 27, image 26 and series 22, image 26). Above T8 spinal cord, dura and the epidural space appear within normal limits. Paraspinal and other soft tissues: Stable visible chest and upper abdominal viscera. Enhancing paraspinal phlegmon with epicenter at T9-T10 has not significantly changed since January (series 24, image 26). There is paraspinal soft tissue edema, but no drainable paraspinal fluid collection. The epidural space there is further described below. Disc levels: C7-T1: Stable increased STIR signal in the disc space without evidence of active inflammation. T1-T2 through T6-T7: Stable, T4-T5 chronic ankylosis. T7-T8: Dural thickening and enhancement. No spinal or foraminal stenosis. T8-T9: Paraspinal phlegmon. Abnormal signal in the disc. Dural thickening and  enhancement. Superimposed posterior element hypertrophy. No epidural fluid collection. Spinal stenosis with mild spinal cord mass effect. Mild to moderate left T8 foraminal stenosis. T9-T10: Partially eroded T9 and T10 endplates with bulky endplate and paraspinal phlegmon and fluid in the disc space. Epidural space inflammation, dural thickening and enhancement. No epidural fluid collection is evident. Spinal stenosis with moderate spinal cord mass effect (series 22, image 26), not significantly changed since January. Severe bilateral T9 foraminal stenosis. T10-T11: Faint abnormal signal in the disc space. Dural thickening and enhancement. Epidural inflammation. No epidural fluid. Mild spinal stenosis. Mild to moderate bilateral T10 foraminal stenosis. T11-T12: Degenerated but otherwise negative disc space. Mild dural thickening and enhancement. No marrow edema at this level. Mild degenerative spinal stenosis and mild to moderate left greater than right T11 foraminal stenosis. T12-L1: Ankylosis with chronic mild spinal stenosis. Mild dural thickening and enhancement here might be chronic. Chronic cord myelomalacia here, and moderate to severe bilateral T12 foraminal stenosis. IMPRESSION: 1. Spinal infection T7-T8 through T11-T12 has not significantly changed since January. Associated spinal cord involvement (see #2). Discitis osteomyelitis at T9-T10, and probably also T8-T9. Dural thickening and enhancement at the other levels. Bulky paraspinal phlegmon epicenter at T9-T10, but no drainable fluid other than that in the T9-T10 disc space. 2. Spinal stenosis throughout those levels with moderate Cord Compression at T9-T10.  Spinal Cord Edema from T8 inferiorly. Superimposed chronic cord myelomalacia at T12-L1. 3. No new levels of infection. 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 Electronically Signed: By: Odessa Fleming M.D. On: 03/06/2018 16:31   Mr Lumbar Spine W Wo Contrast  Result Date: 03/06/2018 CLINICAL DATA:  85  year old male with back pain, possible cauda equina syndrome. History of discitis osteomyelitis and phlegmon in the lower thoracic spine last month T7 through T10. Possible early discitis osteomyelitis at L4-L5 and L5-S1 at that time. 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 EXAM: MRI LUMBAR SPINE WITHOUT AND WITH CONTRAST TECHNIQUE: Multiplanar and multiecho pulse sequences of the lumbar spine were obtained without and with intravenous contrast. CONTRAST:  10 milliliters Gadavist in conjunction with contrast enhanced imaging of the thoracic reported separately. COMPARISON:  Thoracic MRI today reported separately. Lumbar MRI 01/31/2018. FINDINGS: Segmentation: Same numbering system as used in January which designates a vestigial S1-S2 disc space. Alignment:  Stable since January.  No lumbar spondylolisthesis. Vertebrae: The mild marrow edema at the L5-S1 level in January has resolved. There is no evidence of lumbar or upper sacral discitis osteomyelitis. No acute osseous abnormality identified. Conus medullaris and cauda equina: chronic myelomalacia in the lower thoracic spinal cord at T12-L1 appears stable. The conus is at L1-L2 and stable. Cauda equina nerve roots are stable. No abnormal intradural enhancement. No lumbar dural thickening. Paraspinal and other soft tissues: Distended urinary bladder similar to the prior study. Negative visible abdominal viscera. Diffuse paraspinal muscle atrophy is stable, relatively sparing the left psoas muscle. Chronic postoperative changes posteriorly at T12-L1, L3-L4 through the sacrum. Disc levels: Stable since January. T12-L1 ankylosis with posterior disc osteophyte complex with no high-grade spinal stenosis but chronic spinal cord myelomalacia at that level. Ankylosed L2 through L5 vertebrae. Trace increased STIR signal in the L5-S1 disc and posterior elements appears degenerative. Severe multifactorial spinal stenosis at L5-S1 (series 14, image 27), due to bulky disc  osteophyte complex and posterior element hypertrophy. Severe left L5 foraminal stenosis. IMPRESSION: 1. No acute or inflammatory process in the lumbar spine. The mild marrow edema at L5-S1 in January has resolved. 2. Severe chronic multifactorial spinal and left foraminal stenosis at L5-S1. 3. No acute osseous abnormality. Chronic ankylosed T12-L1 and L2 through L5 levels. 4. Chronic lower thoracic spinal cord myelomalacia at T12-L1. 5. Distended urinary bladder. Electronically Signed   By: Odessa Fleming M.D.   On: 03/06/2018 16:15   Dg Chest Portable 1 View  Result Date: 03/06/2018 CLINICAL DATA:  Possible stroke.  Found down. EXAM: PORTABLE CHEST 1 VIEW COMPARISON:  02/16/2018 FINDINGS: The heart is enlarged but stable. There is tortuosity and calcification of the thoracic aorta. The right PICC line is stable. Low lung volumes with vascular crowding and streaky basilar atelectasis. No definite pleural effusions. The bony thorax is intact. IMPRESSION: Cardiac enlargement. Low lung volumes with streaky basilar atelectasis but no definite infiltrates or large effusions. Electronically Signed   By: Rudie Meyer M.D.   On: 03/06/2018 10:30   Ct Head Code Stroke Wo Contrast  Result Date: 03/06/2018 CLINICAL DATA:  Code stroke. Left-sided facial droop, weakness, and slurred speech EXAM: CT HEAD WITHOUT CONTRAST TECHNIQUE: Contiguous axial images were obtained from the base of the skull through the vertex without intravenous contrast. COMPARISON:  02/16/2018 FINDINGS: Brain: No evidence of acute infarction, hemorrhage, hydrocephalus, extra-axial collection or mass lesion/mass effect. Remote right corona radiata infarct. Advanced atrophy with ventriculomegaly Vascular: No hyperdense vessel. Extensive atherosclerotic calcification. Skull: No acute finding Sinuses/Orbits:  Bilateral cataract resection Other: These results were communicated to Dr. Lindzen at 10:04 amon 2/9/2020by text page via the [facility] messaging system.Otelia Limes  ASPECTS [facility]([place] Stroke Program Early CT Score) - Ganglionic level infarction (caudate, lentiform nuclei, internal capsule, insula, M1-M3 cortex): 7 - Supraganglionic infarction (M4-M6 cortex): 3 Total score (0-10 with 10 being normal): 10 IMPRESSION: 1. No acute finding or change from prior. 2. Advanced atrophy. Electronically Signed   By: Marnee SpringJonathon  Watts M.D.   On: 03/06/2018 10:05    PHYSICAL EXAM HEENT-  Normocephalic, no lesions, without obvious abnormality.  Normal external eye and conjunctiva. Lungs- Respirations unlabored Extremities- Mild edema distal lower extremities.  Skin-warm and dry, pressure relief dressing on right heel. Bilateral praffo boots.  Neurological Examination Mental Status: (s/p zofran) Drowsy, requires repeated stimulation to attend to examination. When awake, he is agitated. Appears confused. Limited speech output is with dysarthria but fluent. Able to follow simple commands.  Cranial Nerves: II: Visual fields grossly normal. PERRL.  III,IV, VI: ptosis not present, EOMI  V,VII: Left facial droop. Facial light touch sensation intact bilaterally. Tremor of perioral musculature noted intermittently. VIII: hearing intact to voice IX,X: Unable to visualize palate XI: Head is midline XII: midline tongue extension Motor: Right :  Upper extremity   3/5              Left:     Upper extremity   3/5              Lower extremity   2/5                          Lower extremity   2/5 Does not follow commands consistently. Will flail upper extremities at times when he is agitated.  Sensory:  Light touch intact throughout, bilaterally Deep Tendon Reflexes: absent knee jerks bilaterally  Plantars: Right: downgoing                           Left: downgoing Cerebellar: No ataxia with finger-to-nose, unable to perform HTS Gait: Unable to assess  ASSESSMENT/PLAN Mr. Charlene BrookeHarda [name] is a 85 y.o. male with history of HTN, hypothyroidism, chronic a. Fib (on xarelto), DM, CKD 3,  lumbar spine osteomyelitis (on long term abx with PICC line), dementia with mRS 4 who is from a SNF presenting with recurrent left sided weakness, left facial droop, slurred speech and altered mental status. MRI negative for stroke.    Lewy Body Dementia likely etiology of recurrent stroke-like events  Daughter confirms intermittent "bad days" where he has trouble with his memory and hallucinates. Days in between better. No known hx dementia though has not taken care of own finances in about 2 years. Does have intermittent lethargy. She is concerned with polypharmacy and would like neuro to review.  Code Stroke CT head No acute stroke. Advanced Atrophy. ASPECTS 10.     MRI  No acute stroke.   CTA head & neck pending   2D Echo  (Jan) EF 55-60%. No source of embolus   EEG pending   Xarelto for VTE prophylaxis  Xarelto (rivaroxaban) daily prior to admission, now on Xarelto (rivaroxaban) daily. Continue at d/c  Therapy recommendations:  From SNF. Essentially paraplegic from cord compression. Plan return to SNF  Disposition:  pending (from SNF)  Atrial Fibrillation  Home anticoagulation:  Xarelto (rivaroxaban) daily continued in the hospital . Resume and Continue Xarelto (rivaroxaban) daily at discharge  Hypertension  140-160s . BP goal normotensive  Hyperlipidemia  Home meds:  mevacor 40  Ok to resume mevacor  LDL 84, goal < 70  Continue statin at discharge  Diabetes type II  HgbA1c 5.6, goal < 7.0  Controlled  Other Stroke Risk Factors  Advanced age  Obesity, Body mass index is 34.13 kg/m., recommend weight loss, diet and exercise as appropriate   Hx DVT on xarleto  Other Active Problems  Epidural infection s/ progressive destruction T9 w/ cord compression. Effectively paraplegic. Not a surgical candidate Joshua Maxon([place])  Hospital day # 1  Joshua MainSharon Biby, MSN, APRN, ANVP-BC, AGPCNP-BC Advanced Practice Stroke Nurse [facility] See Amion for  Schedule & Pager information 03/07/2018 10:32 AM   ATTENDING NOTE: I reviewed above note and agree with the assessment and plan. Pt was seen and examined.   85 year old male with history of dementia, A. fib and DVT on Xarelto, CKD 3, diabetes, hypertension, lumbar spine osteomyelitis on long-term antibiotics, bedridden, SNF resident admitted for transient left facial droop, left-sided weakness and altered mental status.  Patient had a similar episode on 11/04/2017, admitted for altered mental status, facial droop and left arm weakness.  MRI negative, MRA unremarkable.  Carotid ultrasound unremarkable.  He had episode of bradycardia down to 20s and was external paced but since resolved.  He had admission on 01/31/2018 for LOC, MRI negative.  He also had a ER visit on 02/16/2018 for altered mental status, resolved in ER, CT negative.  Today on my visit, 3 daughters at bedside.  Patient initially sleeping, but later arousable.  Severe dysarthria, however orientated to place, time and people.  PERRL, EOMI, visual field to full.  Facial symmetrical, tongue midline.  Moving bilateral upper extremities symmetrically, 3-4/5.  Not able to move lower extremities, chronic.  Sensation symmetrical, coordination intact bilateral upper extremities.  Gait not tested.  On this admission, his symptoms resolved, MRI negative for acute infarct.  EF 55 to 60% in 02/01/2018.  EEG no seizure.  CT head and neck no LVO, 60 to 70% stenosis right ICA bifurcation, bilateral V4 stenosis.  LDL 84, A1c 5.6 in 10/2017.  With detailed history collection, patient for the last 2 years had intermittent alternation of alertness, sometimes sleep for 1 to 2 days sometimes hours.  With significant visual hallucinations, acting out of dreams.  Denies any significant parkinsonism symptoms.  With patient history, recurrent symptoms and negative MRI, highly concerning for Lewy body dementia.  Will resume Xarelto and continue statin for stroke  prevention.  Recommend outpatient follow-up with neurology for dementia management.  Neurology will sign off. Please call with questions. [facility] weeks. Thanks for the consult.   Marvel PlanJindong Tifanny Dollens, MD PhD Stroke Neurology 03/07/2018 6:59 PM    To contact Stroke Continuity provider, please refer to [email or web address]. After hours, contact General Neurology

## 2018-03-07 NOTE — Procedures (Signed)
History: 85 year old male being evaluated for altered mental status  Sedation: None  Technique: This is a 21 channel routine scalp EEG performed at the bedside with bipolar and monopolar montages arranged in accordance to the international 10/20 system of electrode placement. One channel was dedicated to EKG recording.    Background: The background is slightly slower with a posterior dominant rhythm of 7 Hz.  This is well-formed and well sustained.  There is also some bifrontally predominant beta activity.  With drowsiness there is an increase in delta and sleep is recorded.  Photic stimulation: Physiologic driving is not performed  EEG Abnormalities: 1) slow posterior dominant rhythm  Clinical Interpretation: This borderline EEG can be consistent with a mild encephalopathy as can be seen in dementing disorders, though this pattern can also be seen in asymptomatic elderly.   There was no seizure or seizure predisposition recorded on this study. Please note that lack of epileptiform activity on EEG does not preclude the possibility of epilepsy.   Ritta Slot, MD [facility] [phone]  If 7pm- 7am, please page neurology on call as listed in AMION.

## 2018-03-07 NOTE — Progress Notes (Addendum)
Patient Demographics:    [name], is a 85 y.o. male, DOB - Jun 25, 1930, BFX:[id number]  Admit date - 03/06/2018   Admitting Physician Dorcas Carrow, MD  Outpatient Primary MD for the patient is [name]., MD  LOS - 1   Chief Complaint  Patient presents with  . Code Stroke        Subjective:    [name] today has no fevers, no emesis,  No chest pain, daughter   at bedside  Assessment  & Plan :    Principal Problem:   Altered mental status Active Problems:   Essential hypertension   Lower extremity weakness   Anemia in chronic kidney disease   Chronic kidney disease, stage III (moderate) (HCC)   Atrial fibrillation (HCC)   Hypothyroidism   DM type 2 (diabetes mellitus, type 2) (HCC)   Prosthetic hip infection (HCC)   Osteomyelitis of thoracic spine (HCC)   Spinal epidural abscess   Brief summary  85 y.o.malewithaPMHx ofHTN, hypothyroidism, chronic a. Fib ( on xarelto), DM, CKD 3, lumbar spineosteomyelitis (on long term abx with PICC line at this time) admitted from facility on 03/06/2018 with strokelike symptoms, on presentation patient had altered mentation with left-sided droop, speech disturbance and left-sided extremity weakness, as well as bowel and urinary incontinence--- stroke work-up including brain MRI negative stroke like symptoms have resolved at this time   Plan  1)Extensive Thoracic and Lumbar spine Osteomyelitis/Discitis/epidural Abscess with destruction of T9 and cord compression----discharged 02/07/2018 with PICC line and IV Ancef, MRI of the thoracic and lumbar spine reflects ongoing/lack of improvement in patient's discitis/osteomyelitis/epidural infection from T7-L1----discussed with ID physician Dr. [name] who is familiar with patient, continue IV cefazolin for now pending further recommendations from ID, Neurosurgical consult appreciated, neurosurgeon  states patient is not a candidate for surgery, repeat blood cultures obtained on admission on 03/06/2018- to date  2)Recurrent episodes of strokelike symptoms-----stroke work-up including brain MRI negative, neurology input appreciated, neurology suggested patient may have Lewy body type dementia  3)Social/Ethics--- patient is essentially paraplegic from cord compression, significant challenges with mobility related activities, bedbound------ needs total care, patient with lack of improvement of the spine/epidural infections despite long-term IV antibiotics, patient has a MOST form, patient and family requesting full scope of treatment and full CODE STATUS, palliative care consult should be beneficial  4)Acute metabolic encephalopathy--- now resolved, query if secondary to underlying infection, neurology believes Lewy body type dementia is playing a role, EEG without epileptiform findings  5)DM2--- last hemoglobin 5.6 reflecting excellent control, Allow some permissive Hyperglycemia rather than risk life-threatening hypoglycemia in a patient with unreliable oral intake. Use Novolog/Humalog Sliding scale insulin with Accu-Cheks/Fingersticks as ordered  6)Chronic Atrial Fibrillation-----okay to restart Xarelto for stroke prophylaxis, last known EF 55 to 60%, rate appears controlled despite no negative chronotropic agents   Disposition/Need for in-Hospital Stay- patient unable to be discharged at this time due to worsening thoracic and lumbar spine infection awaiting ID input, continue IV antibiotics  Code Status : Full  Family Communication:   Daughter at bedside   Disposition Plan  : SNF rehab to complete iv Abx   Consults  :  ID/neurology and neurosurgery  DVT Prophylaxis  :  xarelto  Lab Results  Component Value Date   PLT 126 (L) 03/07/2018    Inpatient Medications  Scheduled Meds: . buPROPion  100 mg Oral BID  . divalproex  125 mg Oral QHS  . docusate sodium  100 mg Oral BID  .  escitalopram  10 mg Oral Daily  . ferrous sulfate  325 mg Oral BID WC  . finasteride  5 mg Oral QHS  . insulin aspart  0-5 Units Subcutaneous QHS  . insulin aspart  0-9 Units Subcutaneous TID WC  . lactulose  10 g Oral QHS  . lactulose  30 g Oral Daily  . levothyroxine  88 mcg Oral Q0600  . pregabalin  75 mg Oral QHS  . rivaroxaban  20 mg Oral Q supper  . rOPINIRole  0.5 mg Oral QHS  . terazosin  5 mg Oral QHS  . tiZANidine  2 mg Oral TID  . torsemide  10 mg Oral Daily  . vitamin C  500 mg Oral BID   Continuous Infusions: . sodium chloride 100 mL/hr at 03/06/18 2157  .  ceFAZolin (ANCEF) IV 2 g (03/07/18 1453)  . dextrose 5% lactated ringers 50 mL/hr at 03/06/18 2159   PRN Meds:.acetaminophen **OR** acetaminophen, HYDROmorphone (DILAUDID) injection, ondansetron **OR** ondansetron (ZOFRAN) IV    Anti-infectives (From admission, onward)   Start     Dose/Rate Route Frequency Ordered Stop   03/06/18 2200  ceFAZolin (ANCEF) IVPB  Status:  Discontinued    Note to Pharmacy:  Indication:  MSSA epidural abscess and discitis/osteomyelitis   2 g Intravenous Every 8 hours 03/06/18 1756 03/06/18 1829   03/06/18 2200  ceFAZolin (ANCEF) IVPB 2g/100 mL premix     2 g 200 mL/hr over 30 Minutes Intravenous Every 8 hours 03/06/18 1830          Objective:   Vitals:   03/07/18 0313 03/07/18 0400 03/07/18 0731 03/07/18 1202  BP:  (!) 169/107 (!) 160/105 105/62  Pulse: 83 78 89 65  Resp: 20 19 (!) 22 20  Temp: 98.7 F (37.1 C)  97.8 F (36.6 C) 97.9 F (36.6 C)  TempSrc: Axillary  Oral Oral  SpO2: 100% 99% 100%   Weight:      Height:        Wt Readings from Last 3 Encounters:  03/06/18 111 kg  02/07/18 111 kg  12/31/17 111.6 kg     Intake/Output Summary (Last 24 hours) at 03/07/2018 1510 Last data filed at 03/07/2018 1502 Gross per 24 hour  Intake 714.26 ml  Output 900 ml  Net -185.74 ml     Physical Exam Patient is examined daily including today on 03/07/18 , exams remain  the same as of yesterday except that has changed   Gen:- Awake , much less lethargic HEENT:- [place].AT, No sclera icterus Neck-Supple Neck,No JVD,.  Lungs-  CTAB , fair symmetrical air movement CV- S1, S2 normal, irregular Abd-  +ve B.Sounds, Abd Soft, No tenderness,    Extremity/Skin:- No  edema, pedal pulses present , right arm PICC line site is clean dry and intact Psych-affect is flat, forgetful at times Neuro- Generalized weakness, paraplegic, no new focal deficits,     Data Review:   Micro Results Recent Results (from the past 240 hour(s))  Blood culture (routine x 2)     Status: None (Preliminary result)   Collection Time: 03/06/18 10:32 AM  Result Value Ref Range Status   Specimen Description BLOOD LEFT HAND  Final   Special Requests   Final  BOTTLES DRAWN AEROBIC AND ANAEROBIC Blood Culture adequate volume   Culture   Final    NO GROWTH 1 DAY Performed at [facility], [address] [place], [place] [address]    Report Status PENDING  Incomplete  Urine culture     Status: Abnormal   Collection Time: 03/06/18 10:40 AM  Result Value Ref Range Status   Specimen Description URINE, RANDOM  Final   Special Requests NONE  Final   Culture (A)  Final    <10,000 COLONIES/mL INSIGNIFICANT GROWTH Performed at [facility], [address], [place], [place] [address]    Report Status 03/07/2018 FINAL  Final  Blood culture (routine x 2)     Status: None (Preliminary result)   Collection Time: 03/06/18 10:57 AM  Result Value Ref Range Status   Specimen Description BLOOD LEFT ANTECUBITAL  Final   Special Requests   Final    BOTTLES DRAWN AEROBIC AND ANAEROBIC Blood Culture adequate volume   Culture   Final    NO GROWTH 1 DAY Performed at [facility], [address], [place], [place] [address]    Report Status PENDING  Incomplete  MRSA PCR Screening     Status: None   Collection Time: 03/07/18  7:21 AM  Result Value Ref Range Status   MRSA by PCR NEGATIVE  NEGATIVE Final    Comment:        The GeneXpert MRSA Assay (FDA approved for NASAL specimens only), is one component of a comprehensive MRSA colonization surveillance program. It is not intended to diagnose MRSA infection nor to guide or monitor treatment for MRSA infections. Performed at [facility], [address], [place], [place] [address]     Radiology Reports Dg Chest 2 View  Result Date: 02/16/2018 CLINICAL DATA:  Confusion x4 months. EXAM: CHEST - 2 VIEW COMPARISON:  02/07/2018 FINDINGS: Stable cardiomegaly with aortic atherosclerosis. Small left posterior pleural effusion with bibasilar atelectasis. PICC line tip terminates at the cavoatrial juncture. No overt pulmonary edema. Degenerative change about the included right AC and glenohumeral joints as well as along the thoracic spine. IMPRESSION: 1. Stable cardiomegaly with aortic atherosclerosis. 2. Small left posterior pleural effusion with bibasilar atelectasis. 3. PICC line tip at the cavoatrial junction. Electronically Signed   By: Tollie Eth M.D.   On: 02/16/2018 17:42   Ct Head Wo Contrast  Result Date: 02/16/2018 CLINICAL DATA:  Low-grade fever currently being treated for epidural abscess. Encephalopathy. EXAM: CT HEAD WITHOUT CONTRAST TECHNIQUE: Contiguous axial images were obtained from the base of the skull through the vertex without intravenous contrast. COMPARISON:  01/30/2017 head CT FINDINGS: Brain: Redemonstration of sulcal and ventricular prominence consistent with superficial and central atrophy. Chronic mild-to-moderate small vessel ischemia of periventricular and deep white matter. No large vascular territory infarction, hemorrhage or midline shift. Midline fourth ventricle and basal cisterns without effacement allowing for patient head tilt. No intra-axial mass nor extra-axial fluid. Vascular: Atherosclerosis at the skull base involving the basilar and both distal vertebral arteries and carotid siphons.  Skull: Intact Sinuses/Orbits: Scattered ethmoid sinus mucosal thickening. Bilateral cataract extractions. Other: None IMPRESSION: Atrophy with chronic small vessel ischemic disease. No acute intracranial abnormality. Electronically Signed   By: Tollie Eth M.D.   On: 02/16/2018 18:23   Mr Brain Wo Contrast  Result Date: 03/06/2018 CLINICAL DATA:  85 year old male code stroke presentation with left side facial droop weakness and slurred speech. EXAM: MRI HEAD WITHOUT CONTRAST TECHNIQUE:  Multiplanar, multiecho pulse sequences of the brain and surrounding structures were obtained without intravenous contrast. COMPARISON:  Head CT earlier today. Brain MRI 01/31/2018 and earlier. FINDINGS: Brain: Study is intermittently degraded by motion artifact despite repeated imaging attempts. No restricted diffusion to suggest acute infarction. No midline shift, mass effect, evidence of mass lesion, extra-axial collection or acute intracranial hemorrhage. Cervicomedullary junction and pituitary are within normal limits. Stable cerebral volume and ex vacuo appearing ventricular enlargement. Scattered cerebral white matter T2 and FLAIR hyperintensity appears stable and is most pronounced in the external capsules. No new signal abnormality. No chronic cerebral blood products are evident Vascular: Major intracranial vascular flow voids are stable with mild to moderate generalized intracranial artery ectasia. Skull and upper cervical spine: Negative visible cervical spine. Normal bone marrow signal. Sinuses/Orbits: Stable, negative. Other: Stable trace mastoid fluid. Negative scalp and face soft tissues. IMPRESSION: 1.  No acute intracranial abnormality. 2. Stable MRI appearance of the brain since 2019. Electronically Signed   By: Odessa FlemingH  Hall M.D.   On: 03/06/2018 15:29   Mr Thoracic Spine W Wo Contrast  Addendum Date: 03/06/2018   ADDENDUM REPORT: 03/06/2018 16:41 ADDENDUM: Study discussed by telephone with Dr. Bruce Donathony Allen in the ED  on 03/06/2018 at 1635 hours. Electronically Signed   By: Odessa FlemingH  Hall M.D.   On: 03/06/2018 16:41   Result Date: 03/06/2018 CLINICAL DATA:  85 year old male with back pain, possible cauda equina syndrome. History of discitis osteomyelitis and phlegmon in the lower thoracic spine last month T7 through T10. Possible early discitis osteomyelitis at L4-L5 and L5-S1 at that time. 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 EXAM: MRI THORACIC WITHOUT AND WITH CONTRAST TECHNIQUE: Multiplanar and multiecho pulse sequences of the thoracic spine were obtained without and with intravenous contrast. CONTRAST:  10 milliliters Gadavist in conjunction with contrast enhanced imaging of the lumbar spine reported separately. COMPARISON:  Lumbar spine MRI today reported separately. Thoracic MRI 01/31/2018. FINDINGS: Limited cervical spine imaging: Stable since January. Intermittent cervical spine ankylosis. Thoracic spine segmentation: Normal, concordant with the numbering in January and on the lumbar MRI today. Alignment: Stable since January. Straightening of lower thoracic kyphosis. Vertebrae: Regressed but not resolved marrow edema in the T8 vertebral body. Stable to slightly regressed marrow edema in T9 and T10. No new levels of edema. Cord: Abnormal spinal cord signal beginning at the T8 level and continuing caudally with increased cord T2 and STIR signal (series 19, image 9). This continues into the lower thoracic cord and conus and is more apparent than on the lumbar images today. No abnormal cord enhancement is identified. But there is abnormal dural thickening and enhancement from T7-T8 through L1 which has not improved since January. There is multifactorial spinal cord compression at T9-T10 (series 27, image 26 and series 22, image 26). Above T8 spinal cord, dura and the epidural space appear within normal limits. Paraspinal and other soft tissues: Stable visible chest and upper abdominal viscera. Enhancing paraspinal phlegmon with  epicenter at T9-T10 has not significantly changed since January (series 24, image 26). There is paraspinal soft tissue edema, but no drainable paraspinal fluid collection. The epidural space there is further described below. Disc levels: C7-T1: Stable increased STIR signal in the disc space without evidence of active inflammation. T1-T2 through T6-T7: Stable, T4-T5 chronic ankylosis. T7-T8: Dural thickening and enhancement. No spinal or foraminal stenosis. T8-T9: Paraspinal phlegmon. Abnormal signal in the disc. Dural thickening and enhancement. Superimposed posterior element hypertrophy. No epidural fluid collection. Spinal stenosis with  mild spinal cord mass effect. Mild to moderate left T8 foraminal stenosis. T9-T10: Partially eroded T9 and T10 endplates with bulky endplate and paraspinal phlegmon and fluid in the disc space. Epidural space inflammation, dural thickening and enhancement. No epidural fluid collection is evident. Spinal stenosis with moderate spinal cord mass effect (series 22, image 26), not significantly changed since January. Severe bilateral T9 foraminal stenosis. T10-T11: Faint abnormal signal in the disc space. Dural thickening and enhancement. Epidural inflammation. No epidural fluid. Mild spinal stenosis. Mild to moderate bilateral T10 foraminal stenosis. T11-T12: Degenerated but otherwise negative disc space. Mild dural thickening and enhancement. No marrow edema at this level. Mild degenerative spinal stenosis and mild to moderate left greater than right T11 foraminal stenosis. T12-L1: Ankylosis with chronic mild spinal stenosis. Mild dural thickening and enhancement here might be chronic. Chronic cord myelomalacia here, and moderate to severe bilateral T12 foraminal stenosis. IMPRESSION: 1. Spinal infection T7-T8 through T11-T12 has not significantly changed since January. Associated spinal cord involvement (see #2). Discitis osteomyelitis at T9-T10, and probably also T8-T9. Dural  thickening and enhancement at the other levels. Bulky paraspinal phlegmon epicenter at T9-T10, but no drainable fluid other than that in the T9-T10 disc space. 2. Spinal stenosis throughout those levels with moderate Cord Compression at T9-T10. Spinal Cord Edema from T8 inferiorly. Superimposed chronic cord myelomalacia at T12-L1. 3. No new levels of infection. 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 Electronically Signed: By: Odessa Fleming M.D. On: 03/06/2018 16:31   Mr Lumbar Spine W Wo Contrast  Result Date: 03/06/2018 CLINICAL DATA:  85 year old male with back pain, possible cauda equina syndrome. History of discitis osteomyelitis and phlegmon in the lower thoracic spine last month T7 through T10. Possible early discitis osteomyelitis at L4-L5 and L5-S1 at that time. 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 EXAM: MRI LUMBAR SPINE WITHOUT AND WITH CONTRAST TECHNIQUE: Multiplanar and multiecho pulse sequences of the lumbar spine were obtained without and with intravenous contrast. CONTRAST:  10 milliliters Gadavist in conjunction with contrast enhanced imaging of the thoracic reported separately. COMPARISON:  Thoracic MRI today reported separately. Lumbar MRI 01/31/2018. FINDINGS: Segmentation: Same numbering system as used in January which designates a vestigial S1-S2 disc space. Alignment:  Stable since January.  No lumbar spondylolisthesis. Vertebrae: The mild marrow edema at the L5-S1 level in January has resolved. There is no evidence of lumbar or upper sacral discitis osteomyelitis. No acute osseous abnormality identified. Conus medullaris and cauda equina: chronic myelomalacia in the lower thoracic spinal cord at T12-L1 appears stable. The conus is at L1-L2 and stable. Cauda equina nerve roots are stable. No abnormal intradural enhancement. No lumbar dural thickening. Paraspinal and other soft tissues: Distended urinary bladder similar to the prior study. Negative visible abdominal viscera. Diffuse  paraspinal muscle atrophy is stable, relatively sparing the left psoas muscle. Chronic postoperative changes posteriorly at T12-L1, L3-L4 through the sacrum. Disc levels: Stable since January. T12-L1 ankylosis with posterior disc osteophyte complex with no high-grade spinal stenosis but chronic spinal cord myelomalacia at that level. Ankylosed L2 through L5 vertebrae. Trace increased STIR signal in the L5-S1 disc and posterior elements appears degenerative. Severe multifactorial spinal stenosis at L5-S1 (series 14, image 27), due to bulky disc osteophyte complex and posterior element hypertrophy. Severe left L5 foraminal stenosis. IMPRESSION: 1. No acute or inflammatory process in the lumbar spine. The mild marrow edema at L5-S1 in January has resolved. 2. Severe chronic multifactorial spinal and left foraminal stenosis at L5-S1. 3. No acute osseous abnormality. Chronic  ankylosed T12-L1 and L2 through L5 levels. 4. Chronic lower thoracic spinal cord myelomalacia at T12-L1. 5. Distended urinary bladder. Electronically Signed   By: Odessa Fleming M.D.   On: 03/06/2018 16:15   Dg Chest Portable 1 View  Result Date: 03/06/2018 CLINICAL DATA:  Possible stroke.  Found down. EXAM: PORTABLE CHEST 1 VIEW COMPARISON:  02/16/2018 FINDINGS: The heart is enlarged but stable. There is tortuosity and calcification of the thoracic aorta. The right PICC line is stable. Low lung volumes with vascular crowding and streaky basilar atelectasis. No definite pleural effusions. The bony thorax is intact. IMPRESSION: Cardiac enlargement. Low lung volumes with streaky basilar atelectasis but no definite infiltrates or large effusions. Electronically Signed   By: Rudie Meyer M.D.   On: 03/06/2018 10:30   Dg Chest Port 1 View  Result Date: 02/07/2018 CLINICAL DATA:  Status post PICC line placement EXAM: PORTABLE CHEST 1 VIEW COMPARISON:  None. FINDINGS: There is a right-sided PICC line with the tip projecting over the SVC. There is no focal  consolidation. There is no pleural effusion or pneumothorax. There is stable cardiomegaly. There is mild osteoarthritis of bilateral glenohumeral joints. There is arthropathy of bilateral acromioclavicular joints. IMPRESSION: Right-sided PICC line with the tip projecting over the SVC. Electronically Signed   By: Elige Ko   On: 02/07/2018 13:05   Ct Head Code Stroke Wo Contrast  Result Date: 03/06/2018 CLINICAL DATA:  Code stroke. Left-sided facial droop, weakness, and slurred speech EXAM: CT HEAD WITHOUT CONTRAST TECHNIQUE: Contiguous axial images were obtained from the base of the skull through the vertex without intravenous contrast. COMPARISON:  02/16/2018 FINDINGS: Brain: No evidence of acute infarction, hemorrhage, hydrocephalus, extra-axial collection or mass lesion/mass effect. Remote right corona radiata infarct. Advanced atrophy with ventriculomegaly Vascular: No hyperdense vessel. Extensive atherosclerotic calcification. Skull: No acute finding Sinuses/Orbits: Bilateral cataract resection Other: These results were communicated to Dr. Otelia Limes at 10:04 amon 2/9/2020by text page via the [facility] messaging system. ASPECTS [facility] Stroke Program Early CT Score) - Ganglionic level infarction (caudate, lentiform nuclei, internal capsule, insula, M1-M3 cortex): 7 - Supraganglionic infarction (M4-M6 cortex): 3 Total score (0-10 with 10 being normal): 10 IMPRESSION: 1. No acute finding or change from prior. 2. Advanced atrophy. Electronically Signed   By: Marnee Spring M.D.   On: 03/06/2018 10:05   [place] Ekg Site Rite  Result Date: 02/06/2018 If Site Rite image not attached, placement could not be confirmed due to current cardiac rhythm.    CBC Recent Labs  Lab 03/06/18 0953 03/07/18 0443  WBC 5.8 5.4  HGB 9.8* 11.0*  HCT 32.7* 36.5*  PLT 129* 126*  MCV 100.0 100.3*  MCH 30.0 30.2  MCHC 30.0 30.1  RDW 15.6* 15.4  LYMPHSABS 0.9  --   MONOABS 0.9  --   EOSABS 0.1  --   BASOSABS 0.0  --      Chemistries  Recent Labs  Lab 03/06/18 0952 03/06/18 0953 03/07/18 0443  NA  --  142 144  K  --  4.1 4.0  CL  --  106 109  CO2  --  27 27  GLUCOSE  --  91 75  BUN  --  9 11  CREATININE 0.90 1.00 1.12  CALCIUM  --  8.9 8.7*  AST  --  22  --   ALT  --  6  --   ALKPHOS  --  66  --   BILITOT  --  0.5  --    ------------------------------------------------------------------------------------------------------------------  No results for input(s): CHOL, HDL, LDLCALC, TRIG, CHOLHDL, LDLDIRECT in the last 72 hours.  Lab Results  Component Value Date   HGBA1C 5.6 11/03/2017   ------------------------------------------------------------------------------------------------------------------ Recent Labs    03/06/18 1721  TSH 2.294   ------------------------------------------------------------------------------------------------------------------ No results for input(s): VITAMINB12, FOLATE, FERRITIN, TIBC, IRON, RETICCTPCT in the last 72 hours.  Coagulation profile Recent Labs  Lab 03/06/18 0953  INR 1.74    -----------------------------------------------------------------------------------------------------------------    Component Value Date/Time   BNP 158.7 (H) 01/30/2018 1620     Shon Haleourage Yandell Mcjunkins M.D on 03/07/2018 at 3:10 PM  Go to [email or web address] - for contact info  [facility] - Office  [phone]

## 2018-03-07 NOTE — Progress Notes (Signed)
EEG Completed; Results Pending  

## 2018-03-07 NOTE — Consult Note (Signed)
   Date of Admission:  03/06/2018          Reason for Consult: Progressive destructive thoracic and lumbar discitis and vertebral osteomyelitis with cord compression and paraplegia    Referring Provider: Dr. Emokpae   Assessment:  1. Encephalopathy which is likely multifactorial 2. Progressive destructive thoracic and lumbar discitis epidural abscess and complete compression of the cord continued paraplegia 3. 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 Not a surgical candidate  Plan:  1. Palliative care consult I think would be the most compassionate maneuver that can be made his daughter is in agreement with this. 2. In the interim I will change to nafcillin to maximize central nervous system pet penetration of his antistaphylococcal antibiotic I very much doubt CNS infection seems reasonable in the context of his confusion 3. If one wants to target other organisms besides the MSSA one could consider interventional radiology guided aspirate of the disc space but this would seem not easy to accomplish and previously did not yield a different organism than what he has had time and time again  Principal Problem:   Altered mental status Active Problems:   Essential hypertension   Lower extremity weakness   Anemia in chronic kidney disease   Chronic kidney disease, stage III (moderate) (HCC)   Atrial fibrillation (HCC)   Hypothyroidism   DM type 2 (diabetes mellitus, type 2) (HCC)   Prosthetic hip infection (HCC)   Osteomyelitis of thoracic spine (HCC)   Spinal epidural abscess   Scheduled Meds: . buPROPion  100 mg Oral BID  . divalproex  125 mg Oral QHS  . docusate sodium  100 mg Oral BID  . escitalopram  10 mg Oral Daily  . ferrous sulfate  325 mg Oral BID WC  . finasteride  5 mg Oral QHS  .  insulin aspart  0-5 Units Subcutaneous QHS  . insulin aspart  0-9 Units Subcutaneous TID WC  . iopamidol      . lactulose  10 g Oral QHS  . lactulose  30 g Oral Daily  . levothyroxine  88 mcg Oral Q0600  . nafcillin  2 g Intravenous Q4H  . pregabalin  75 mg Oral QHS  . rivaroxaban  20 mg Oral Q supper  . rOPINIRole  0.5 mg Oral QHS  . terazosin  5 mg Oral QHS  . tiZANidine  2 mg Oral TID  . torsemide  10 mg Oral Daily  . vitamin C  500 mg Oral BID   Continuous Infusions: . sodium chloride 100 mL/hr at 03/06/18 2157  . dextrose 5% lactated ringers 50 mL/hr at 03/06/18 2159   PRN Meds:.acetaminophen **OR** acetaminophen, HYDROmorphone (DILAUDID) injection, ondansetron **OR** ondansetron (ZOFRAN) IV  HPI: [name] is a 85 y.o. male year old male known by me since I met him in 2014. At that time he had with Pacemaker, Right prosthetic hip, Laminectomy in January of 2014 admitted in August 2014 with right leg pain weakness fevers. He has been found to have MSSA bacteremia and a large retroperitoneal abscess that is adjacent to his prosthetic hip.  Orthopedics performed resection of Radical irrigation and debridement right total hip with removal of all synovium, revision of femoral head, revision of polyethylene liner placement On 09/02/12  TEE failed to show vegetations on valves or overtly on PM. PM is out. He returned to   the hospital with dislocation of his hip. This was able to be reduced without surgical intervention. My partner Dr. Hatcher saw the patient and noted his sedimentation rate and C-reactive protein is still be quite high. Patient completed a course of  8 weeks of cefazolin 2 g IV every 8 hours at the skilled nursing facility along with  oral rifampin twice daily.   I changed him to oral Keflex when his IV antibiotics were stopped and he continued on high-dose Keflex with rifampin since then.   He had recurrent hip dislocations since then.  He was readmitted in late  October 2014 and underwent  Irrigation and debridement of infected right total hip with removal of all synovium, revision of femoral head to a +5 40 mm metal head, revision of polyethylene liner to a constrained liner  He had remained on keflex 500mg po tid in the interim but stopped the rifampin.  His hip pain  dissapeared but he then developed pain in the right leg itself and difficulty raising leg vs gravity though he can bear weight on it in fall of 2015.  Since then he has experienced recurrence of MSSA infection of his hip requiring  Radical irrigation and debridement of infected hybrid right total hip with removal of all synovium, removal of all components including cement going down the femur and the distal cement restrictor Hemovac drains. Placement of acetabular polymethylmethacrylate spacer   By Dr. Rowan on July 16th, 2016.  intraop cultures yielded no organism but he was on keflex (and rifampin prior to surgery). He had  been placed back on Ancef IV and then transitioned back to oral keflex currently at 1 g po bid.  In  November we found that he had  recurrence of pain in his thoracic region including his ribs. He underwent a CTA that did not show a PE on 11/26/14. He was not having fevers but his pain in his ribs and lower back is much worse.  We obtained MRI with sedation which showed:  Acute discitis osteomyelitis at T9-T10, T12-L1. Early discitis at T8-T9 and L4-L5, but no epidural or paraspinal abscess.  We had him urgently admitted to the hospital and had the antibiotics held. IR aspirate from the disk yielded no organisms and he was restarted on IV ancef. His back pain had intiially  improved substantially since then typically worse when getting up from supine position..  He then had worsening back pain..  It has been specially problematic in the am when he attempts to get up from supine position and he has "Tsunami of pain."  His ESR was completely  normal and CRP was normalizing.  I had offered repeat MRI now but he would prefered to wait esp since he requires conscious sedation due to the pain.  At last visithe had continued to suffer form "Tsunami" of pain when changing position. He told  me that this was less of an issue when he had the PICC line but that handling the PICC line forced him to move more carefully.  I saw him in December 2017 then in  November of 2018 and he told me that over 3 months prior he had worsening of pain.   He was  here with his daughter apparently he has been trying to do more physical therapy with a walker and been developing pain in his hands and arms attempting to use the walker.  He as also  unfortunately is experiencing stress to the fact that his wife had   become quite sick and is unable to help him with many of his activities of daily living for which she requires assistance.    He was  largely spending most of his time in a wheelchair.  He was at that time frankly overtly depressed and tearful during exam.  His pain was not well managed presently and he is still trying to get this under better control.  I last saw him in January 2019 where he was still having severe pain at times though less so than the time before I had seen him.  In the interim in January of this year of 2020 admitted after developing progressive spasm in his legs with progressive lower extremity weakness and numbness.  MRI was done of the thoracic spine and showed evidence of an epidural abscess.  Attempt was made by interventional radiology to aspirate the collection but he could not tolerate the procedure initially.  Eventually an attempt was made to aspirate the abscess and this was done.  Unfortunately the cultures did not yield any organisms.  When this happened he was rationally placed back on cefazolin given the fact that he had only grown methicillin sensitive staph before and the only active antibiotic he had been on  was cephalexin.  He had been on high-dose cefazolin as prescribed by us when he was again admitted to the ER after worsening confusion and a left-sided facial droop.  He was obtunded.  Stroke team was called and it was again then decided not to do TPA.  He thought to be suffering from a metabolic encephalopathy.  He had MRI of the brain which was normal MRI of the thoracic spine and lumbar spine showed progressive destruction of the T9 vertebra and worsening epidural infection with cord compression.  He was evaluated Dr. Stern with neurosurgery.  Dr. Stern felt that he was not a candidate for neurosurgery given his extensive morbidities and high risk for further morbidity and mortality in the operating room and the fact that his paraplegia would not likely be corrected by such an aggressive intervention.  We were consulted due to concerns about whether he was on the right antibiotics or not.  Certainly I think methicillin sensitive staph aureus is the only organism that should be isolated given his history and documented cultures which I know quite extensively.  His daughter with whom I talked to again relayed quite a bit of concern about his encephalopathy.  He apparently is been hallucinating quite a bit and been confused.  This is a fairly new abrupt worsening superimposed against a chronic progression of cognitive decline though again is more abruptly worse.  I will note that he is also separate them is fairly severe pression at times in the past in part due to all of his multiple medical problems.  Of note his wife also is suffering from pancreatic cancer.  I think it is not unreasonable to switch to nafcillin on the remote chance that his previously metastatic methicillin sensitive staph aureus had now invaded the central nervous system.  I do not think this is likely but in the context of his encephalopathy I think it is reasonable to put him on an anti-staphylococcal penicillin that does  penetrate the central nervous system.  If there is any concern about some other organism that we need to cover then 1 could consult interventional radiology to put a needle into the disc space to try to isolate an organism.  I personally do not think this is worth   it at this point in time  I also think that he very much needs a palliative care consult because he now has an effectively incurable infection and his quality of life has become much worse.  Review of Systems: Review of Systems  Unable to perform ROS: Acuity of condition    Past Medical History:  Diagnosis Date  . Alzheimer's type dementia ([place]) 12/07/2016  . Anemia   . Anxiety   . Arthritis   . Bacteremia   . Bradycardia    a. PPM removed in 2014 due to bacteremia.  . Chronic atrial fibrillation   . Chronic right shoulder pain 06/17/2015  . CKD (chronic kidney disease), stage III ([place])   . Complication of anesthesia    " Terrible Nightmares"  . Constipation   . Depression   . Diabetes mellitus without complication ([place])    Type II  . DVT (deep venous thrombosis) ([place])    a. in 2019 - pt reports behind knee - was on Xarelto 59m BID now 248mdaily.  . Encounter for removal of cardiac resynchronization therapy pacemaker 2017  . Grief 06/17/2015  . History of blood product transfusion   . Hypertension   . Hypothyroidism   . [place] ([facility] [place]  . Major depression 06/17/2015  . MSSA (methicillin susceptible Staphylococcus aureus) infection   . OAB (overactive bladder)   . Overactive bladder   . Septic hip ([facility]  . Septicemia ([facility]  . Severe depression ([facility]2016  . Staphylococcus aureus infection   . Thoracic spine pain 11/28/2014    Social History   Tobacco Use  . Smoking status: Never Smoker  . Smokeless tobacco: Never Used  Substance Use Topics  . Alcohol use: No    Alcohol/week: 0.0 standard drinks  . Drug use: No    Family History  Problem Relation Age of Onset  . Heart failure  Mother    Allergies  Allergen Reactions  . Ace Inhibitors Cough  . Gabapentin Other (See Comments)    Unknown reaction  . Nsaids Other (See Comments)    Renal insufficiency   . Zolpidem Other (See Comments)    hallucinations Nightmares, irritable, nervous Disorientation    OBJECTIVE: Blood pressure (!) 141/90, pulse 80, temperature 97.8 F (36.6 C), temperature source Oral, resp. rate 20, height 5' 11" (1.803 m), weight 111 kg, SpO2 98 %.  Physical Exam Constitutional:      General: He is not in acute distress.    Appearance: Normal appearance. He is well-developed. He is obese. He is not ill-appearing or diaphoretic.  HENT:     Head: Normocephalic and atraumatic.     Right Ear: Hearing and external ear normal.     Left Ear: Hearing and external ear normal.     Nose: No nasal deformity or rhinorrhea.  Eyes:     General: No scleral icterus.    Conjunctiva/sclera: Conjunctivae normal.     Right eye: Right conjunctiva is not injected.     Left eye: Left conjunctiva is not injected.  Neck:     Musculoskeletal: Normal range of motion and neck supple.     Vascular: No JVD.  Cardiovascular:     Rate and Rhythm: Normal rate and regular rhythm.     Heart sounds: Normal heart sounds, S1 normal and S2 normal. No murmur. No friction rub.  Pulmonary:     Effort: Pulmonary effort is normal. No respiratory distress.     Breath sounds: Normal breath sounds.  No stridor. No wheezing.  Abdominal:     General: Abdomen is flat. Bowel sounds are normal. There is no distension.     Palpations: Abdomen is soft.     Tenderness: There is no abdominal tenderness.  Musculoskeletal:     Right shoulder: Normal.     Left shoulder: Normal.     Right hip: Normal.     Left hip: Normal.     Right knee: Normal.     Left knee: Normal.  Lymphadenopathy:     Head:     Right side of head: No submandibular, preauricular or posterior auricular adenopathy.     Left side of head: No submandibular,  preauricular or posterior auricular adenopathy.     Cervical: No cervical adenopathy.     Right cervical: No superficial or deep cervical adenopathy.    Left cervical: No superficial or deep cervical adenopathy.  Skin:    General: Skin is warm and dry.     Coloration: Skin is pale.     Findings: No abrasion, bruising, ecchymosis, erythema or rash.     Nails: There is no clubbing.   Neurological:     Mental Status: He is alert. He is disoriented.  Psychiatric:        Attention and Perception: He is attentive.        Speech: Speech normal.        Behavior: Behavior is cooperative.   He did nice me and did  answer some of my questions but was quite confused at other times.  He does have paraplegia of his lower extremities which are in protective boots  Lab Results Lab Results  Component Value Date   WBC 5.4 03/07/2018   HGB 11.0 (L) 03/07/2018   HCT 36.5 (L) 03/07/2018   MCV 100.3 (H) 03/07/2018   PLT 126 (L) 03/07/2018    Lab Results  Component Value Date   CREATININE 1.12 03/07/2018   BUN 11 03/07/2018   NA 144 03/07/2018   K 4.0 03/07/2018   CL 109 03/07/2018   CO2 27 03/07/2018    Lab Results  Component Value Date   ALT 6 03/06/2018   AST 22 03/06/2018   ALKPHOS 66 03/06/2018   BILITOT 0.5 03/06/2018     Microbiology: Recent Results (from the past 240 hour(s))  Blood culture (routine x 2)     Status: None (Preliminary result)   Collection Time: 03/06/18 10:32 AM  Result Value Ref Range Status   Specimen Description BLOOD LEFT HAND  Final   Special Requests   Final    BOTTLES DRAWN AEROBIC AND ANAEROBIC Blood Culture adequate volume   Culture   Final    NO GROWTH 1 DAY Performed at [facility], [address] [place], [place] [address]    Report Status PENDING  Incomplete  Urine culture     Status: Abnormal   Collection Time: 03/06/18 10:40 AM  Result Value Ref Range Status   Specimen Description URINE, RANDOM  Final   Special Requests NONE  Final    Culture (A)  Final    <10,000 COLONIES/mL INSIGNIFICANT GROWTH Performed at  Hospital Lab, [address] [place], [place] [address]    Report Status 03/07/2018 FINAL  Final  Blood culture (routine x 2)     Status: None (Preliminary result)   Collection Time: 03/06/18 10:57 AM  Result Value Ref Range Status   Specimen Description BLOOD LEFT ANTECUBITAL  Final   Special Requests     Final    BOTTLES DRAWN AEROBIC AND ANAEROBIC Blood Culture adequate volume   Culture   Final    NO GROWTH 1 DAY Performed at [facility], [address] [place], [place] [address]    Report Status PENDING  Incomplete  MRSA PCR Screening     Status: None   Collection Time: 03/07/18  7:21 AM  Result Value Ref Range Status   MRSA by PCR NEGATIVE NEGATIVE Final    Comment:        The GeneXpert MRSA Assay (FDA approved for NASAL specimens only), is one component of a comprehensive MRSA colonization surveillance program. It is not intended to diagnose MRSA infection nor to guide or monitor treatment for MRSA infections. Performed at [facility], [address] , [place] [address]      [place], MD [facility] [phone] pager  03/07/2018, 5:14 PM  

## 2018-03-08 DIAGNOSIS — R410 Disorientation, unspecified: Secondary | ICD-10-CM

## 2018-03-08 DIAGNOSIS — I4821 Permanent atrial fibrillation: Secondary | ICD-10-CM

## 2018-03-08 DIAGNOSIS — M4644 Discitis, unspecified, thoracic region: Secondary | ICD-10-CM

## 2018-03-08 DIAGNOSIS — I10 Essential (primary) hypertension: Secondary | ICD-10-CM

## 2018-03-08 DIAGNOSIS — Z7189 Other specified counseling: Secondary | ICD-10-CM

## 2018-03-08 DIAGNOSIS — M4624 Osteomyelitis of vertebra, thoracic region: Secondary | ICD-10-CM

## 2018-03-08 DIAGNOSIS — Z515 Encounter for palliative care: Secondary | ICD-10-CM

## 2018-03-08 DIAGNOSIS — R443 Hallucinations, unspecified: Secondary | ICD-10-CM

## 2018-03-08 LAB — GLUCOSE, CAPILLARY
GLUCOSE-CAPILLARY: 97 mg/dL (ref 70–99)
Glucose-Capillary: 73 mg/dL (ref 70–99)
Glucose-Capillary: 84 mg/dL (ref 70–99)
Glucose-Capillary: 71 mg/dL (ref 70–99)

## 2018-03-08 MED ORDER — MUSCLE RUB 10-15 % EX CREA
TOPICAL_CREAM | Freq: Two times a day (BID) | CUTANEOUS | Status: DC | PRN
  Filled 2018-03-08: qty 85

## 2018-03-08 MED ORDER — ENSURE ENLIVE PO LIQD
237.0000 mL | Freq: Two times a day (BID) | ORAL | Status: DC
  Administered 2018-03-09 (×2): 237 mL via ORAL

## 2018-03-08 NOTE — Progress Notes (Signed)
Patient Demographics:    [name], is a 85 y.o. male, DOB - [birth year], MBT:[id number]  Admit date - 03/06/2018   Admitting Physician Joshua Carrow, MD  Outpatient Primary MD for the patient is Joshua Lolling [place]., MD  LOS - 2   Chief Complaint  Patient presents with  . Code Stroke        Subjective:    [name] today has no fevers, no emesis,  No chest pain, daughter and wife  at bedside, oral intake is poor  Assessment  & Plan :    Principal Problem:   Altered mental status Active Problems:   Essential hypertension   Lower extremity weakness   Anemia in chronic kidney disease   Chronic kidney disease, stage III (moderate) (HCC)   Atrial fibrillation (HCC)   Hypothyroidism   DM type 2 (diabetes mellitus, type 2) (HCC)   Prosthetic hip infection (HCC)   Osteomyelitis of thoracic spine (HCC)   Spinal epidural abscess   Brief summary  85 y.o.malewithaPMHx ofHTN, hypothyroidism, chronic a. Fib ( on xarelto), DM, CKD 3, lumbar spineosteomyelitis (on long term abx with PICC line at this time) admitted from facility on 03/06/2018 with strokelike symptoms, on presentation patient had altered mentation with left-sided droop, speech disturbance and left-sided extremity weakness, as well as bowel and urinary incontinence--- stroke work-up including brain MRI negative stroke like symptoms have resolved at this time, infectious disease changed patient's antibiotic from Ancef to nafcillin on 03/07/2018   Plan  1)Extensive Thoracic and Lumbar spine Osteomyelitis/Discitis/Epidural Abscess with destruction of T9 and cord compression----discharged to SNF 02/07/2018 with PICC line and IV Ancef, MRI of the thoracic and lumbar spine reflects ongoing/lack of improvement in patient's discitis/osteomyelitis/epidural infection from T7-L1----discussed with ID physician Dr. [name] who is familiar with  patient,  infectious disease changed patient's antibiotic from Ancef to nafcillin on 03/07/2018, Neurosurgical consult appreciated, neurosurgeon states patient is not a candidate for surgery, repeat blood cultures obtained on admission on 03/06/2018 Neg to date,    2)Recurrent episodes of strokelike symptoms-----stroke work-up including brain MRI negative, neurology input appreciated, neurology suggested patient may have Lewy body type dementia  3)Social/Ethics--- patient is essentially paraplegic from cord compression, significant challenges with mobility related activities, bedbound------ needs total care, patient with lack of improvement of the spine/epidural infections despite long-term IV antibiotics, patient has a MOST form, patient and family requesting full scope of treatment, patient and family now requesting CODE STATUS be changed to DNR, palliative care consult appreciated, overall prognosis is poor  4)Acute metabolic encephalopathy--- now resolved, query if secondary to underlying infection, neurology believes Lewy body type dementia is playing a role, EEG without epileptiform findings  5)DM2--- last hemoglobin 5.6 reflecting excellent control, Allow some permissive Hyperglycemia rather than risk life-threatening hypoglycemia in a patient with unreliable oral intake. Use Novolog/Humalog Sliding scale insulin with Accu-Cheks/Fingersticks as ordered  6)Chronic Atrial Fibrillation-----c/n  Xarelto for stroke prophylaxis, last known EF 55 to 60%, rate appears controlled despite no negative chronotropic agents   7)FEN----oral intake is poor, continue to encourage supplements, too sleepy for Remeron as an appetite stimulant, await further palliative discussions with regards to goals of care prior to further decisions on diet and appetite stimulants  Disposition/Need for in-Hospital Stay-  patient unable to be discharged at this time due to worsening thoracic and lumbar spine infection ,  continue IV  antibiotics  Code Status : Full  Family Communication:   Daughter at bedside   Disposition Plan  : SNF rehab to complete iv Abx , overall prognosis is poor  Consults  :  ID/neurology and neurosurgery  DVT Prophylaxis  :  xarelto  Lab Results  Component Value Date   PLT 126 (L) 03/07/2018    Inpatient Medications  Scheduled Meds: . buPROPion  100 mg Oral BID  . divalproex  125 mg Oral QHS  . docusate sodium  100 mg Oral BID  . escitalopram  10 mg Oral Daily  . feeding supplement (ENSURE ENLIVE)  237 mL Oral BID BM  . ferrous sulfate  325 mg Oral BID WC  . finasteride  5 mg Oral QHS  . insulin aspart  0-5 Units Subcutaneous QHS  . insulin aspart  0-9 Units Subcutaneous TID WC  . lactulose  10 g Oral QHS  . lactulose  30 g Oral Daily  . levothyroxine  88 mcg Oral Q0600  . pregabalin  75 mg Oral QHS  . rivaroxaban  20 mg Oral Q supper  . rOPINIRole  0.5 mg Oral QHS  . terazosin  5 mg Oral QHS  . tiZANidine  2 mg Oral TID  . torsemide  10 mg Oral Daily  . vitamin C  500 mg Oral BID   Continuous Infusions: . sodium chloride 100 mL/hr at 03/07/18 1830  . dextrose 5% lactated ringers 50 mL/hr at 03/06/18 2159  . nafcillin IV 2 g (03/08/18 1300)   PRN Meds:.acetaminophen **OR** acetaminophen, HYDROmorphone (DILAUDID) injection, MUSCLE RUB, ondansetron **OR** ondansetron (ZOFRAN) IV    Anti-infectives (From admission, onward)   Start     Dose/Rate Route Frequency Ordered Stop   03/07/18 2000  nafcillin injection 2 g  Status:  Discontinued     2 g Intravenous Every 4 hours 03/07/18 1603 03/07/18 1742   03/07/18 2000  nafcillin 2 g in sodium chloride 0.9 % 100 mL IVPB     2 g 200 mL/hr over 30 Minutes Intravenous Every 4 hours 03/07/18 1742     03/06/18 2200  ceFAZolin (ANCEF) IVPB  Status:  Discontinued    Note to Pharmacy:  Indication:  MSSA epidural abscess and discitis/osteomyelitis   2 g Intravenous Every 8 hours 03/06/18 1756 03/06/18 1829   03/06/18 2200   ceFAZolin (ANCEF) IVPB 2g/100 mL premix  Status:  Discontinued     2 g 200 mL/hr over 30 Minutes Intravenous Every 8 hours 03/06/18 1830 03/07/18 1603        Objective:   Vitals:   03/08/18 0000 03/08/18 0400 03/08/18 0800 03/08/18 1200  BP: 107/61 (!) 102/56 139/88 132/70  Pulse: 82 70 70 78  Resp: 19 19 20 15   Temp: 98 F (36.7 C) 98.4 F (36.9 C) 98.2 F (36.8 C) 97.7 F (36.5 C)  TempSrc: Axillary Axillary Axillary   SpO2: 98% 98% 97% 100%  Weight:      Height:        Wt Readings from Last 3 Encounters:  03/06/18 111 kg  02/07/18 111 kg  12/31/17 111.6 kg     Intake/Output Summary (Last 24 hours) at 03/08/2018 1641 Last data filed at 03/08/2018 0400 Gross per 24 hour  Intake 3814.96 ml  Output 826 ml  Net 2988.96 ml     Physical Exam Patient is examined daily including  today on 03/08/18 , exams remain the same as of yesterday except that has changed   Gen:- Awake , interactive  HEENT:- [place].AT, No sclera icterus Neck-Supple Neck,No JVD,.  Lungs-  CTAB , fair symmetrical air movement CV- S1, S2 normal, irregular Abd-  +ve B.Sounds, Abd Soft, No tenderness,    Extremity/Skin:- No  edema, pedal pulses present , right arm PICC line site is clean dry and intact Psych-affect is flat, forgetful at times Neuro- Generalized weakness, paraplegic, no new focal deficits,     Data Review:   Micro Results Recent Results (from the past 240 hour(s))  Blood culture (routine x 2)     Status: None (Preliminary result)   Collection Time: 03/06/18 10:32 AM  Result Value Ref Range Status   Specimen Description BLOOD LEFT HAND  Final   Special Requests   Final    BOTTLES DRAWN AEROBIC AND ANAEROBIC Blood Culture adequate volume   Culture   Final    NO GROWTH 2 DAYS Performed at [facility], [address], [place], [place] [address]    Report Status PENDING  Incomplete  Urine culture     Status: Abnormal   Collection Time: 03/06/18 10:40 AM  Result Value Ref Range  Status   Specimen Description URINE, RANDOM  Final   Special Requests NONE  Final   Culture (A)  Final    <10,000 COLONIES/mL INSIGNIFICANT GROWTH Performed at [facility], [address], [place], [place] [address]    Report Status 03/07/2018 FINAL  Final  Blood culture (routine x 2)     Status: None (Preliminary result)   Collection Time: 03/06/18 10:57 AM  Result Value Ref Range Status   Specimen Description BLOOD LEFT ANTECUBITAL  Final   Special Requests   Final    BOTTLES DRAWN AEROBIC AND ANAEROBIC Blood Culture adequate volume   Culture   Final    NO GROWTH 2 DAYS Performed at [facility], [address], [place], [place] [address]    Report Status PENDING  Incomplete  MRSA PCR Screening     Status: None   Collection Time: 03/07/18  7:21 AM  Result Value Ref Range Status   MRSA by PCR NEGATIVE NEGATIVE Final    Comment:        The GeneXpert MRSA Assay (FDA approved for NASAL specimens only), is one component of a comprehensive MRSA colonization surveillance program. It is not intended to diagnose MRSA infection nor to guide or monitor treatment for MRSA infections. Performed at [facility], [address], [place], [place] [address]     Radiology Reports Ct Angio Head W Or Wo Contrast  Result Date: 03/07/2018 CLINICAL DATA:  Initial evaluation for stroke. EXAM: CT ANGIOGRAPHY HEAD AND NECK TECHNIQUE: Multidetector CT imaging of the head and neck was performed using the standard protocol during bolus administration of intravenous contrast. Multiplanar CT image reconstructions and MIPs were obtained to evaluate the vascular anatomy. Carotid stenosis measurements (when applicable) are obtained utilizing NASCET criteria, using the distal internal carotid diameter as the denominator. CONTRAST:  75mL ISOVUE-370 IOPAMIDOL (ISOVUE-370) INJECTION 76%, <See Chart> ISOVUE-370 IOPAMIDOL (ISOVUE-370) INJECTION 76% COMPARISON:  Prior CT and MRI from 03/06/2018  FINDINGS: CT HEAD FINDINGS Brain: Advanced cerebral atrophy with chronic microvascular ischemic disease again noted, stable. No acute intracranial hemorrhage. No acute large vessel territory infarct. No mass lesion, midline shift or mass effect. Stable ventricular prominence related to global parenchymal volume loss without hydrocephalus.  No extra-axial fluid collection. Vascular: Calcified atherosclerosis present at the skull base. Skull: Calvarium and scalp soft tissues demonstrate no acute finding. Sinuses: Chronic mucoperiosteal thickening present within the ethmoidal air cells and maxillary sinuses. No mastoid effusion. Orbits: Patient status post bilateral ocular lens replacement. Review of the MIP images confirms the above findings CTA NECK FINDINGS Aortic arch: Visualized aortic arch of normal caliber with normal 3 vessel morphology. Moderate atheromatous plaque about the aortic arch with no hemodynamically significant stenosis about the origin of the great vessels. Visualized subclavian arteries widely patent. Right carotid system: Right CCA tortuous proximally but widely patent to the bifurcation without significant stenosis. Evaluation the right carotid bifurcation limited by motion artifact. Bulky calcified atheromatous plaque at the right bifurcation with up to approximately 60-70% stenosis by NASCET criteria. Right ICA mildly tortuous but otherwise widely patent distally to the skull base. Left carotid system: Left CCA tortuous proximally. Mixed plaque at the distal left CCA/bifurcation with associated stenosis of up to 25% by NASCET criteria. Left ICA mildly tortuous but otherwise widely patent to the skull base without additional stenosis. Vertebral arteries: Both of the vertebral arteries arise from the subclavian arteries. Vertebral arteries widely patent within the neck without stenosis, dissection, or occlusion. Skeleton: No acute osseous abnormality. No discrete lytic or blastic osseous lesions.  Bulky bridging osteophytic spurring noted throughout the cervicothoracic spine, suggesting DISH. OPLL present at C2-3. Prominent degenerative thickening at the tectorial membrane. Other neck: No other soft tissue abnormality within the neck. Upper chest: Right subclavian approach central venous catheter partially visualized. Layering left pleural effusion with associated atelectasis partially visualized as well. Remainder the visualized upper chest demonstrates no other acute finding. Review of the MIP images confirms the above findings CTA HEAD FINDINGS Anterior circulation: Petrous segments widely patent bilaterally. Smooth diffuse atheromatous plaque within the cavernous/supraclinoid ICAs with mild diffuse narrowing. ICA termini widely patent bilaterally. A1 segments widely patent. Normal anterior communicating artery. Anterior cerebral arteries widely patent to their distal aspects without stenosis. Right M1 widely patent. Normal right MCA bifurcation. Distal right MCA branches well perfused and widely patent. Mild atheromatous irregularity with resultant mild diffuse narrowing of the mid and distal left M1 segment noted. Normal left MCA bifurcation. Distal left MCA branches well perfused and fairly symmetric with the right. Posterior circulation: Calcified plaque within the V4 segments bilaterally with resultant mild stenosis. Vertebral arteries otherwise patent to the vertebrobasilar junction. Posterior inferior cerebral arteries patent proximally. Mild atheromatous irregularity within the basilar artery without significant stenosis. Superior cerebral arteries patent bilaterally. Both of the posterior cerebral arteries primarily supplied via the basilar and are widely patent to their distal aspects. Venous sinuses: Patent. Anatomic variants: None significant. Delayed phase: No abnormal enhancement. Review of the MIP images confirms the above findings IMPRESSION: 1. Negative CTA for large vessel occlusion. 2.  Approximate 60-70% atheromatous stenosis at the right carotid bifurcation. 3. Short-segment mild to moderate bilateral V4 stenoses. 4. Otherwise wide patency of the major arterial vasculature of the head and neck. 5. Layering left pleural effusion with associated atelectasis. Electronically Signed   By: Rise Mu M.D.   On: 03/07/2018 17:31   Dg Chest 2 View  Result Date: 02/16/2018 CLINICAL DATA:  Confusion x4 months. EXAM: CHEST - 2 VIEW COMPARISON:  02/07/2018 FINDINGS: Stable cardiomegaly with aortic atherosclerosis. Small left posterior pleural effusion with bibasilar atelectasis. PICC line tip terminates at the cavoatrial juncture. No overt pulmonary edema. Degenerative change about the included right AC and glenohumeral joints as  well as along the thoracic spine. IMPRESSION: 1. Stable cardiomegaly with aortic atherosclerosis. 2. Small left posterior pleural effusion with bibasilar atelectasis. 3. PICC line tip at the cavoatrial junction. Electronically Signed   By: Tollie Eth M.D.   On: 02/16/2018 17:42   Ct Head Wo Contrast  Result Date: 02/16/2018 CLINICAL DATA:  Low-grade fever currently being treated for epidural abscess. Encephalopathy. EXAM: CT HEAD WITHOUT CONTRAST TECHNIQUE: Contiguous axial images were obtained from the base of the skull through the vertex without intravenous contrast. COMPARISON:  01/30/2017 head CT FINDINGS: Brain: Redemonstration of sulcal and ventricular prominence consistent with superficial and central atrophy. Chronic mild-to-moderate small vessel ischemia of periventricular and deep white matter. No large vascular territory infarction, hemorrhage or midline shift. Midline fourth ventricle and basal cisterns without effacement allowing for patient head tilt. No intra-axial mass nor extra-axial fluid. Vascular: Atherosclerosis at the skull base involving the basilar and both distal vertebral arteries and carotid siphons. Skull: Intact Sinuses/Orbits:  Scattered ethmoid sinus mucosal thickening. Bilateral cataract extractions. Other: None IMPRESSION: Atrophy with chronic small vessel ischemic disease. No acute intracranial abnormality. Electronically Signed   By: Tollie Eth M.D.   On: 02/16/2018 18:23   Ct Angio Neck W Or Wo Contrast  Result Date: 03/07/2018 CLINICAL DATA:  Initial evaluation for stroke. EXAM: CT ANGIOGRAPHY HEAD AND NECK TECHNIQUE: Multidetector CT imaging of the head and neck was performed using the standard protocol during bolus administration of intravenous contrast. Multiplanar CT image reconstructions and MIPs were obtained to evaluate the vascular anatomy. Carotid stenosis measurements (when applicable) are obtained utilizing NASCET criteria, using the distal internal carotid diameter as the denominator. CONTRAST:  75mL ISOVUE-370 IOPAMIDOL (ISOVUE-370) INJECTION 76%, <See Chart> ISOVUE-370 IOPAMIDOL (ISOVUE-370) INJECTION 76% COMPARISON:  Prior CT and MRI from 03/06/2018 FINDINGS: CT HEAD FINDINGS Brain: Advanced cerebral atrophy with chronic microvascular ischemic disease again noted, stable. No acute intracranial hemorrhage. No acute large vessel territory infarct. No mass lesion, midline shift or mass effect. Stable ventricular prominence related to global parenchymal volume loss without hydrocephalus. No extra-axial fluid collection. Vascular: Calcified atherosclerosis present at the skull base. Skull: Calvarium and scalp soft tissues demonstrate no acute finding. Sinuses: Chronic mucoperiosteal thickening present within the ethmoidal air cells and maxillary sinuses. No mastoid effusion. Orbits: Patient status post bilateral ocular lens replacement. Review of the MIP images confirms the above findings CTA NECK FINDINGS Aortic arch: Visualized aortic arch of normal caliber with normal 3 vessel morphology. Moderate atheromatous plaque about the aortic arch with no hemodynamically significant stenosis about the origin of the great  vessels. Visualized subclavian arteries widely patent. Right carotid system: Right CCA tortuous proximally but widely patent to the bifurcation without significant stenosis. Evaluation the right carotid bifurcation limited by motion artifact. Bulky calcified atheromatous plaque at the right bifurcation with up to approximately 60-70% stenosis by NASCET criteria. Right ICA mildly tortuous but otherwise widely patent distally to the skull base. Left carotid system: Left CCA tortuous proximally. Mixed plaque at the distal left CCA/bifurcation with associated stenosis of up to 25% by NASCET criteria. Left ICA mildly tortuous but otherwise widely patent to the skull base without additional stenosis. Vertebral arteries: Both of the vertebral arteries arise from the subclavian arteries. Vertebral arteries widely patent within the neck without stenosis, dissection, or occlusion. Skeleton: No acute osseous abnormality. No discrete lytic or blastic osseous lesions. Bulky bridging osteophytic spurring noted throughout the cervicothoracic spine, suggesting DISH. OPLL present at C2-3. Prominent degenerative thickening at the tectorial membrane. Other  neck: No other soft tissue abnormality within the neck. Upper chest: Right subclavian approach central venous catheter partially visualized. Layering left pleural effusion with associated atelectasis partially visualized as well. Remainder the visualized upper chest demonstrates no other acute finding. Review of the MIP images confirms the above findings CTA HEAD FINDINGS Anterior circulation: Petrous segments widely patent bilaterally. Smooth diffuse atheromatous plaque within the cavernous/supraclinoid ICAs with mild diffuse narrowing. ICA termini widely patent bilaterally. A1 segments widely patent. Normal anterior communicating artery. Anterior cerebral arteries widely patent to their distal aspects without stenosis. Right M1 widely patent. Normal right MCA bifurcation. Distal  right MCA branches well perfused and widely patent. Mild atheromatous irregularity with resultant mild diffuse narrowing of the mid and distal left M1 segment noted. Normal left MCA bifurcation. Distal left MCA branches well perfused and fairly symmetric with the right. Posterior circulation: Calcified plaque within the V4 segments bilaterally with resultant mild stenosis. Vertebral arteries otherwise patent to the vertebrobasilar junction. Posterior inferior cerebral arteries patent proximally. Mild atheromatous irregularity within the basilar artery without significant stenosis. Superior cerebral arteries patent bilaterally. Both of the posterior cerebral arteries primarily supplied via the basilar and are widely patent to their distal aspects. Venous sinuses: Patent. Anatomic variants: None significant. Delayed phase: No abnormal enhancement. Review of the MIP images confirms the above findings IMPRESSION: 1. Negative CTA for large vessel occlusion. 2. Approximate 60-70% atheromatous stenosis at the right carotid bifurcation. 3. Short-segment mild to moderate bilateral V4 stenoses. 4. Otherwise wide patency of the major arterial vasculature of the head and neck. 5. Layering left pleural effusion with associated atelectasis. Electronically Signed   By: Rise Mu M.D.   On: 03/07/2018 17:31   Mr Brain Wo Contrast  Result Date: 03/06/2018 CLINICAL DATA:  85 year old male code stroke presentation with left side facial droop weakness and slurred speech. EXAM: MRI HEAD WITHOUT CONTRAST TECHNIQUE: Multiplanar, multiecho pulse sequences of the brain and surrounding structures were obtained without intravenous contrast. COMPARISON:  Head CT earlier today. Brain MRI 01/31/2018 and earlier. FINDINGS: Brain: Study is intermittently degraded by motion artifact despite repeated imaging attempts. No restricted diffusion to suggest acute infarction. No midline shift, mass effect, evidence of mass lesion,  extra-axial collection or acute intracranial hemorrhage. Cervicomedullary junction and pituitary are within normal limits. Stable cerebral volume and ex vacuo appearing ventricular enlargement. Scattered cerebral white matter T2 and FLAIR hyperintensity appears stable and is most pronounced in the external capsules. No new signal abnormality. No chronic cerebral blood products are evident Vascular: Major intracranial vascular flow voids are stable with mild to moderate generalized intracranial artery ectasia. Skull and upper cervical spine: Negative visible cervical spine. Normal bone marrow signal. Sinuses/Orbits: Stable, negative. Other: Stable trace mastoid fluid. Negative scalp and face soft tissues. IMPRESSION: 1.  No acute intracranial abnormality. 2. Stable MRI appearance of the brain since 2019. Electronically Signed   By: Odessa Fleming M.D.   On: 03/06/2018 15:29   Mr Thoracic Spine W Wo Contrast  Addendum Date: 03/06/2018   ADDENDUM REPORT: 03/06/2018 16:41 ADDENDUM: Study discussed by telephone with Dr. Bruce Donath in the ED on 03/06/2018 at 1635 hours. Electronically Signed   By: Odessa Fleming M.D.   On: 03/06/2018 16:41   Result Date: 03/06/2018 CLINICAL DATA:  85 year old male with back pain, possible cauda equina syndrome. History of discitis osteomyelitis and phlegmon in the lower thoracic spine last month T7 through T10. Possible early discitis osteomyelitis at L4-L5 and L5-S1 at that time. Chronic  discitis osteomyelitis at T12-L1. EXAM: MRI THORACIC WITHOUT AND WITH CONTRAST TECHNIQUE: Multiplanar and multiecho pulse sequences of the thoracic spine were obtained without and with intravenous contrast. CONTRAST:  10 milliliters Gadavist in conjunction with contrast enhanced imaging of the lumbar spine reported separately. COMPARISON:  Lumbar spine MRI today reported separately. Thoracic MRI 01/31/2018. FINDINGS: Limited cervical spine imaging: Stable since January. Intermittent cervical spine ankylosis.  Thoracic spine segmentation: Normal, concordant with the numbering in January and on the lumbar MRI today. Alignment: Stable since January. Straightening of lower thoracic kyphosis. Vertebrae: Regressed but not resolved marrow edema in the T8 vertebral body. Stable to slightly regressed marrow edema in T9 and T10. No new levels of edema. Cord: Abnormal spinal cord signal beginning at the T8 level and continuing caudally with increased cord T2 and STIR signal (series 19, image 9). This continues into the lower thoracic cord and conus and is more apparent than on the lumbar images today. No abnormal cord enhancement is identified. But there is abnormal dural thickening and enhancement from T7-T8 through L1 which has not improved since January. There is multifactorial spinal cord compression at T9-T10 (series 27, image 26 and series 22, image 26). Above T8 spinal cord, dura and the epidural space appear within normal limits. Paraspinal and other soft tissues: Stable visible chest and upper abdominal viscera. Enhancing paraspinal phlegmon with epicenter at T9-T10 has not significantly changed since January (series 24, image 26). There is paraspinal soft tissue edema, but no drainable paraspinal fluid collection. The epidural space there is further described below. Disc levels: C7-T1: Stable increased STIR signal in the disc space without evidence of active inflammation. T1-T2 through T6-T7: Stable, T4-T5 chronic ankylosis. T7-T8: Dural thickening and enhancement. No spinal or foraminal stenosis. T8-T9: Paraspinal phlegmon. Abnormal signal in the disc. Dural thickening and enhancement. Superimposed posterior element hypertrophy. No epidural fluid collection. Spinal stenosis with mild spinal cord mass effect. Mild to moderate left T8 foraminal stenosis. T9-T10: Partially eroded T9 and T10 endplates with bulky endplate and paraspinal phlegmon and fluid in the disc space. Epidural space inflammation, dural thickening and  enhancement. No epidural fluid collection is evident. Spinal stenosis with moderate spinal cord mass effect (series 22, image 26), not significantly changed since January. Severe bilateral T9 foraminal stenosis. T10-T11: Faint abnormal signal in the disc space. Dural thickening and enhancement. Epidural inflammation. No epidural fluid. Mild spinal stenosis. Mild to moderate bilateral T10 foraminal stenosis. T11-T12: Degenerated but otherwise negative disc space. Mild dural thickening and enhancement. No marrow edema at this level. Mild degenerative spinal stenosis and mild to moderate left greater than right T11 foraminal stenosis. T12-L1: Ankylosis with chronic mild spinal stenosis. Mild dural thickening and enhancement here might be chronic. Chronic cord myelomalacia here, and moderate to severe bilateral T12 foraminal stenosis. IMPRESSION: 1. Spinal infection T7-T8 through T11-T12 has not significantly changed since January. Associated spinal cord involvement (see #2). Discitis osteomyelitis at T9-T10, and probably also T8-T9. Dural thickening and enhancement at the other levels. Bulky paraspinal phlegmon epicenter at T9-T10, but no drainable fluid other than that in the T9-T10 disc space. 2. Spinal stenosis throughout those levels with moderate Cord Compression at T9-T10. Spinal Cord Edema from T8 inferiorly. Superimposed chronic cord myelomalacia at T12-L1. 3. No new levels of infection. 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 Electronically Signed: By: Odessa Fleming M.D. On: 03/06/2018 16:31   Mr Lumbar Spine W Wo Contrast  Result Date: 03/06/2018 CLINICAL DATA:  85 year old male with back pain, possible cauda  equina syndrome. History of discitis osteomyelitis and phlegmon in the lower thoracic spine last month T7 through T10. Possible early discitis osteomyelitis at L4-L5 and L5-S1 at that time. 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 EXAM: MRI LUMBAR SPINE WITHOUT AND WITH CONTRAST TECHNIQUE: Multiplanar  and multiecho pulse sequences of the lumbar spine were obtained without and with intravenous contrast. CONTRAST:  10 milliliters Gadavist in conjunction with contrast enhanced imaging of the thoracic reported separately. COMPARISON:  Thoracic MRI today reported separately. Lumbar MRI 01/31/2018. FINDINGS: Segmentation: Same numbering system as used in January which designates a vestigial S1-S2 disc space. Alignment:  Stable since January.  No lumbar spondylolisthesis. Vertebrae: The mild marrow edema at the L5-S1 level in January has resolved. There is no evidence of lumbar or upper sacral discitis osteomyelitis. No acute osseous abnormality identified. Conus medullaris and cauda equina: chronic myelomalacia in the lower thoracic spinal cord at T12-L1 appears stable. The conus is at L1-L2 and stable. Cauda equina nerve roots are stable. No abnormal intradural enhancement. No lumbar dural thickening. Paraspinal and other soft tissues: Distended urinary bladder similar to the prior study. Negative visible abdominal viscera. Diffuse paraspinal muscle atrophy is stable, relatively sparing the left psoas muscle. Chronic postoperative changes posteriorly at T12-L1, L3-L4 through the sacrum. Disc levels: Stable since January. T12-L1 ankylosis with posterior disc osteophyte complex with no high-grade spinal stenosis but chronic spinal cord myelomalacia at that level. Ankylosed L2 through L5 vertebrae. Trace increased STIR signal in the L5-S1 disc and posterior elements appears degenerative. Severe multifactorial spinal stenosis at L5-S1 (series 14, image 27), due to bulky disc osteophyte complex and posterior element hypertrophy. Severe left L5 foraminal stenosis. IMPRESSION: 1. No acute or inflammatory process in the lumbar spine. The mild marrow edema at L5-S1 in January has resolved. 2. Severe chronic multifactorial spinal and left foraminal stenosis at L5-S1. 3. No acute osseous abnormality. Chronic ankylosed T12-L1 and  L2 through L5 levels. 4. Chronic lower thoracic spinal cord myelomalacia at T12-L1. 5. Distended urinary bladder. Electronically Signed   By: Odessa Fleming M.D.   On: 03/06/2018 16:15   Dg Chest Portable 1 View  Result Date: 03/06/2018 CLINICAL DATA:  Possible stroke.  Found down. EXAM: PORTABLE CHEST 1 VIEW COMPARISON:  02/16/2018 FINDINGS: The heart is enlarged but stable. There is tortuosity and calcification of the thoracic aorta. The right PICC line is stable. Low lung volumes with vascular crowding and streaky basilar atelectasis. No definite pleural effusions. The bony thorax is intact. IMPRESSION: Cardiac enlargement. Low lung volumes with streaky basilar atelectasis but no definite infiltrates or large effusions. Electronically Signed   By: Rudie Meyer M.D.   On: 03/06/2018 10:30   Dg Chest Port 1 View  Result Date: 02/07/2018 CLINICAL DATA:  Status post PICC line placement EXAM: PORTABLE CHEST 1 VIEW COMPARISON:  None. FINDINGS: There is a right-sided PICC line with the tip projecting over the SVC. There is no focal consolidation. There is no pleural effusion or pneumothorax. There is stable cardiomegaly. There is mild osteoarthritis of bilateral glenohumeral joints. There is arthropathy of bilateral acromioclavicular joints. IMPRESSION: Right-sided PICC line with the tip projecting over the SVC. Electronically Signed   By: Elige Ko   On: 02/07/2018 13:05   Ct Head Code Stroke Wo Contrast  Result Date: 03/06/2018 CLINICAL DATA:  Code stroke. Left-sided facial droop, weakness, and slurred speech EXAM: CT HEAD WITHOUT CONTRAST TECHNIQUE: Contiguous axial images were obtained from the base of the skull through the vertex without intravenous contrast.  COMPARISON:  02/16/2018 FINDINGS: Brain: No evidence of acute infarction, hemorrhage, hydrocephalus, extra-axial collection or mass lesion/mass effect. Remote right corona radiata infarct. Advanced atrophy with ventriculomegaly Vascular: No hyperdense  vessel. Extensive atherosclerotic calcification. Skull: No acute finding Sinuses/Orbits: Bilateral cataract resection Other: These results were communicated to Dr. Otelia LimesLindzen at 10:04 amon 2/9/2020by text page via the [facility] messaging system. ASPECTS [facility]([place] Stroke Program Early CT Score) - Ganglionic level infarction (caudate, lentiform nuclei, internal capsule, insula, M1-M3 cortex): 7 - Supraganglionic infarction (M4-M6 cortex): 3 Total score (0-10 with 10 being normal): 10 IMPRESSION: 1. No acute finding or change from prior. 2. Advanced atrophy. Electronically Signed   By: Marnee SpringJonathon  Watts M.D.   On: 03/06/2018 10:05     CBC Recent Labs  Lab 03/06/18 0953 03/07/18 0443  WBC 5.8 5.4  HGB 9.8* 11.0*  HCT 32.7* 36.5*  PLT 129* 126*  MCV 100.0 100.3*  MCH 30.0 30.2  MCHC 30.0 30.1  RDW 15.6* 15.4  LYMPHSABS 0.9  --   MONOABS 0.9  --   EOSABS 0.1  --   BASOSABS 0.0  --     Chemistries  Recent Labs  Lab 03/06/18 0952 03/06/18 0953 03/07/18 0443  NA  --  142 144  K  --  4.1 4.0  CL  --  106 109  CO2  --  27 27  GLUCOSE  --  91 75  BUN  --  9 11  CREATININE 0.90 1.00 1.12  CALCIUM  --  8.9 8.7*  AST  --  22  --   ALT  --  6  --   ALKPHOS  --  66  --   BILITOT  --  0.5  --    ------------------------------------------------------------------------------------------------------------------ No results for input(s): CHOL, HDL, LDLCALC, TRIG, CHOLHDL, LDLDIRECT in the last 72 hours.  Lab Results  Component Value Date   HGBA1C 5.6 11/03/2017   ------------------------------------------------------------------------------------------------------------------ Recent Labs    03/06/18 1721  TSH 2.294   Coagulation profile Recent Labs  Lab 03/06/18 0953  INR 1.74   -----------------------------------------------------------------------------------------------------------------    Component Value Date/Time   BNP 158.7 (H) 01/30/2018 1620   Shon Haleourage Keymon Mcelroy M.D on 03/08/2018  at 4:41 PM  Go to [email or web address] - for contact info  [facility] - Office  [phone]

## 2018-03-08 NOTE — Progress Notes (Signed)
Subjective: No new complaints   Antibiotics:  Anti-infectives (From admission, onward)   Start     Dose/Rate Route Frequency Ordered Stop   03/07/18 2000  nafcillin injection 2 g  Status:  Discontinued     2 g Intravenous Every 4 hours 03/07/18 1603 03/07/18 1742   03/07/18 2000  nafcillin 2 g in sodium chloride 0.9 % 100 mL IVPB     2 g 200 mL/hr over 30 Minutes Intravenous Every 4 hours 03/07/18 1742     03/06/18 2200  ceFAZolin (ANCEF) IVPB  Status:  Discontinued    Note to Pharmacy:  Indication:  MSSA epidural abscess and discitis/osteomyelitis   2 g Intravenous Every 8 hours 03/06/18 1756 03/06/18 1829   03/06/18 2200  ceFAZolin (ANCEF) IVPB 2g/100 mL premix  Status:  Discontinued     2 g 200 mL/hr over 30 Minutes Intravenous Every 8 hours 03/06/18 1830 03/07/18 1603      Medications: Scheduled Meds: . buPROPion  100 mg Oral BID  . divalproex  125 mg Oral QHS  . docusate sodium  100 mg Oral BID  . escitalopram  10 mg Oral Daily  . ferrous sulfate  325 mg Oral BID WC  . finasteride  5 mg Oral QHS  . insulin aspart  0-5 Units Subcutaneous QHS  . insulin aspart  0-9 Units Subcutaneous TID WC  . lactulose  10 g Oral QHS  . lactulose  30 g Oral Daily  . levothyroxine  88 mcg Oral Q0600  . pregabalin  75 mg Oral QHS  . rivaroxaban  20 mg Oral Q supper  . rOPINIRole  0.5 mg Oral QHS  . terazosin  5 mg Oral QHS  . tiZANidine  2 mg Oral TID  . torsemide  10 mg Oral Daily  . vitamin C  500 mg Oral BID   Continuous Infusions: . sodium chloride 100 mL/hr at 03/07/18 1830  . dextrose 5% lactated ringers 50 mL/hr at 03/06/18 2159  . nafcillin IV 2 g (03/08/18 0458)   PRN Meds:.acetaminophen **OR** acetaminophen, HYDROmorphone (DILAUDID) injection, ondansetron **OR** ondansetron (ZOFRAN) IV    Objective: Weight change:   Intake/Output Summary (Last 24 hours) at 03/08/2018 0830 Last data filed at 03/08/2018 0400 Gross per 24 hour  Intake 4174.96 ml  Output  1426 ml  Net 2748.96 ml   Blood pressure 139/88, pulse 70, temperature 98.2 F (36.8 C), temperature source Axillary, resp. rate 20, height 5\' 11"  (1.803 m), weight 111 kg, SpO2 97 %. Temp:  [97.8 F (36.6 C)-98.7 F (37.1 C)] 98.2 F (36.8 C) (02/11 0800) Pulse Rate:  [65-82] 70 (02/11 0800) Resp:  [19-20] 20 (02/11 0800) BP: (102-141)/(56-96) 139/88 (02/11 0800) SpO2:  [97 %-98 %] 97 % (02/11 0800)  Physical Exam: General: Patient was very somnolent when I examined him this morning and desaturating, then she woke up and was able to tell me he was in the hospital before going back to sleep.  Saturations then came back up HEENT: anicteric sclera, EOMI CVS regular rate, normal  Chest: , no wheezing, no respiratory distress Abdomen: soft non-distended,  Extremities: no edema or deformity noted bilaterally Skin: no rashes Neuro: No new focal findings  CBC:    BMET Recent Labs    03/06/18 0953 03/07/18 0443  NA 142 144  K 4.1 4.0  CL 106 109  CO2 27 27  GLUCOSE 91 75  BUN 9 11  CREATININE 1.00 1.12  CALCIUM  8.9 8.7*     Liver Panel  Recent Labs    03/06/18 0953  PROT 6.3*  ALBUMIN 2.9*  AST 22  ALT 6  ALKPHOS 66  BILITOT 0.5       Sedimentation Rate No results for input(s): ESRSEDRATE in the last 72 hours. C-Reactive Protein No results for input(s): CRP in the last 72 hours.  Micro Results: Recent Results (from the past 720 hour(s))  Blood culture (routine x 2)     Status: None (Preliminary result)   Collection Time: 03/06/18 10:32 AM  Result Value Ref Range Status   Specimen Description BLOOD LEFT HAND  Final   Special Requests   Final    BOTTLES DRAWN AEROBIC AND ANAEROBIC Blood Culture adequate volume   Culture   Final    NO GROWTH 2 DAYS Performed at [facility], [address] [place], [place] [address]    Report Status PENDING  Incomplete  Urine culture     Status: Abnormal   Collection Time: 03/06/18 10:40 AM  Result Value Ref  Range Status   Specimen Description URINE, RANDOM  Final   Special Requests NONE  Final   Culture (A)  Final    <10,000 COLONIES/mL INSIGNIFICANT GROWTH Performed at [facility], [address] [place], [place] [address]    Report Status 03/07/2018 FINAL  Final  Blood culture (routine x 2)     Status: None (Preliminary result)   Collection Time: 03/06/18 10:57 AM  Result Value Ref Range Status   Specimen Description BLOOD LEFT ANTECUBITAL  Final   Special Requests   Final    BOTTLES DRAWN AEROBIC AND ANAEROBIC Blood Culture adequate volume   Culture   Final    NO GROWTH 2 DAYS Performed at [facility], [address] [place], [place] [address]    Report Status PENDING  Incomplete  MRSA PCR Screening     Status: None   Collection Time: 03/07/18  7:21 AM  Result Value Ref Range Status   MRSA by PCR NEGATIVE NEGATIVE Final    Comment:        The GeneXpert MRSA Assay (FDA approved for NASAL specimens only), is one component of a comprehensive MRSA colonization surveillance program. It is not intended to diagnose MRSA infection nor to guide or monitor treatment for MRSA infections. Performed at [facility], [address] [place], [place] [address]     Studies/Results: Ct Angio Head W Or Wo Contrast  Result Date: 03/07/2018 CLINICAL DATA:  Initial evaluation for stroke. EXAM: CT ANGIOGRAPHY HEAD AND NECK TECHNIQUE: Multidetector CT imaging of the head and neck was performed using the standard protocol during bolus administration of intravenous contrast. Multiplanar CT image reconstructions and MIPs were obtained to evaluate the vascular anatomy. Carotid stenosis measurements (when applicable) are obtained utilizing NASCET criteria, using the distal internal carotid diameter as the denominator. CONTRAST:  75mL ISOVUE-370 IOPAMIDOL (ISOVUE-370) INJECTION 76%, <See Chart> ISOVUE-370 IOPAMIDOL (ISOVUE-370) INJECTION 76% COMPARISON:  Prior CT and MRI from  03/06/2018 FINDINGS: CT HEAD FINDINGS Brain: Advanced cerebral atrophy with chronic microvascular ischemic disease again noted, stable. No acute intracranial hemorrhage. No acute large vessel territory infarct. No mass lesion, midline shift or mass effect. Stable ventricular prominence related to global parenchymal volume loss without hydrocephalus. No extra-axial fluid collection. Vascular: Calcified atherosclerosis present at the skull base. Skull: Calvarium and scalp soft tissues demonstrate no acute finding. Sinuses: Chronic mucoperiosteal thickening present within the ethmoidal air  cells and maxillary sinuses. No mastoid effusion. Orbits: Patient status post bilateral ocular lens replacement. Review of the MIP images confirms the above findings CTA NECK FINDINGS Aortic arch: Visualized aortic arch of normal caliber with normal 3 vessel morphology. Moderate atheromatous plaque about the aortic arch with no hemodynamically significant stenosis about the origin of the great vessels. Visualized subclavian arteries widely patent. Right carotid system: Right CCA tortuous proximally but widely patent to the bifurcation without significant stenosis. Evaluation the right carotid bifurcation limited by motion artifact. Bulky calcified atheromatous plaque at the right bifurcation with up to approximately 60-70% stenosis by NASCET criteria. Right ICA mildly tortuous but otherwise widely patent distally to the skull base. Left carotid system: Left CCA tortuous proximally. Mixed plaque at the distal left CCA/bifurcation with associated stenosis of up to 25% by NASCET criteria. Left ICA mildly tortuous but otherwise widely patent to the skull base without additional stenosis. Vertebral arteries: Both of the vertebral arteries arise from the subclavian arteries. Vertebral arteries widely patent within the neck without stenosis, dissection, or occlusion. Skeleton: No acute osseous abnormality. No discrete lytic or blastic  osseous lesions. Bulky bridging osteophytic spurring noted throughout the cervicothoracic spine, suggesting DISH. OPLL present at C2-3. Prominent degenerative thickening at the tectorial membrane. Other neck: No other soft tissue abnormality within the neck. Upper chest: Right subclavian approach central venous catheter partially visualized. Layering left pleural effusion with associated atelectasis partially visualized as well. Remainder the visualized upper chest demonstrates no other acute finding. Review of the MIP images confirms the above findings CTA HEAD FINDINGS Anterior circulation: Petrous segments widely patent bilaterally. Smooth diffuse atheromatous plaque within the cavernous/supraclinoid ICAs with mild diffuse narrowing. ICA termini widely patent bilaterally. A1 segments widely patent. Normal anterior communicating artery. Anterior cerebral arteries widely patent to their distal aspects without stenosis. Right M1 widely patent. Normal right MCA bifurcation. Distal right MCA branches well perfused and widely patent. Mild atheromatous irregularity with resultant mild diffuse narrowing of the mid and distal left M1 segment noted. Normal left MCA bifurcation. Distal left MCA branches well perfused and fairly symmetric with the right. Posterior circulation: Calcified plaque within the V4 segments bilaterally with resultant mild stenosis. Vertebral arteries otherwise patent to the vertebrobasilar junction. Posterior inferior cerebral arteries patent proximally. Mild atheromatous irregularity within the basilar artery without significant stenosis. Superior cerebral arteries patent bilaterally. Both of the posterior cerebral arteries primarily supplied via the basilar and are widely patent to their distal aspects. Venous sinuses: Patent. Anatomic variants: None significant. Delayed phase: No abnormal enhancement. Review of the MIP images confirms the above findings IMPRESSION: 1. Negative CTA for large  vessel occlusion. 2. Approximate 60-70% atheromatous stenosis at the right carotid bifurcation. 3. Short-segment mild to moderate bilateral V4 stenoses. 4. Otherwise wide patency of the major arterial vasculature of the head and neck. 5. Layering left pleural effusion with associated atelectasis. Electronically Signed   By: Rise Mu M.D.   On: 03/07/2018 17:31   Ct Angio Neck W Or Wo Contrast  Result Date: 03/07/2018 CLINICAL DATA:  Initial evaluation for stroke. EXAM: CT ANGIOGRAPHY HEAD AND NECK TECHNIQUE: Multidetector CT imaging of the head and neck was performed using the standard protocol during bolus administration of intravenous contrast. Multiplanar CT image reconstructions and MIPs were obtained to evaluate the vascular anatomy. Carotid stenosis measurements (when applicable) are obtained utilizing NASCET criteria, using the distal internal carotid diameter as the denominator. CONTRAST:  75mL ISOVUE-370 IOPAMIDOL (ISOVUE-370) INJECTION 76%, <See Chart> ISOVUE-370 IOPAMIDOL (ISOVUE-370)  INJECTION 76% COMPARISON:  Prior CT and MRI from 03/06/2018 FINDINGS: CT HEAD FINDINGS Brain: Advanced cerebral atrophy with chronic microvascular ischemic disease again noted, stable. No acute intracranial hemorrhage. No acute large vessel territory infarct. No mass lesion, midline shift or mass effect. Stable ventricular prominence related to global parenchymal volume loss without hydrocephalus. No extra-axial fluid collection. Vascular: Calcified atherosclerosis present at the skull base. Skull: Calvarium and scalp soft tissues demonstrate no acute finding. Sinuses: Chronic mucoperiosteal thickening present within the ethmoidal air cells and maxillary sinuses. No mastoid effusion. Orbits: Patient status post bilateral ocular lens replacement. Review of the MIP images confirms the above findings CTA NECK FINDINGS Aortic arch: Visualized aortic arch of normal caliber with normal 3 vessel morphology. Moderate  atheromatous plaque about the aortic arch with no hemodynamically significant stenosis about the origin of the great vessels. Visualized subclavian arteries widely patent. Right carotid system: Right CCA tortuous proximally but widely patent to the bifurcation without significant stenosis. Evaluation the right carotid bifurcation limited by motion artifact. Bulky calcified atheromatous plaque at the right bifurcation with up to approximately 60-70% stenosis by NASCET criteria. Right ICA mildly tortuous but otherwise widely patent distally to the skull base. Left carotid system: Left CCA tortuous proximally. Mixed plaque at the distal left CCA/bifurcation with associated stenosis of up to 25% by NASCET criteria. Left ICA mildly tortuous but otherwise widely patent to the skull base without additional stenosis. Vertebral arteries: Both of the vertebral arteries arise from the subclavian arteries. Vertebral arteries widely patent within the neck without stenosis, dissection, or occlusion. Skeleton: No acute osseous abnormality. No discrete lytic or blastic osseous lesions. Bulky bridging osteophytic spurring noted throughout the cervicothoracic spine, suggesting DISH. OPLL present at C2-3. Prominent degenerative thickening at the tectorial membrane. Other neck: No other soft tissue abnormality within the neck. Upper chest: Right subclavian approach central venous catheter partially visualized. Layering left pleural effusion with associated atelectasis partially visualized as well. Remainder the visualized upper chest demonstrates no other acute finding. Review of the MIP images confirms the above findings CTA HEAD FINDINGS Anterior circulation: Petrous segments widely patent bilaterally. Smooth diffuse atheromatous plaque within the cavernous/supraclinoid ICAs with mild diffuse narrowing. ICA termini widely patent bilaterally. A1 segments widely patent. Normal anterior communicating artery. Anterior cerebral arteries  widely patent to their distal aspects without stenosis. Right M1 widely patent. Normal right MCA bifurcation. Distal right MCA branches well perfused and widely patent. Mild atheromatous irregularity with resultant mild diffuse narrowing of the mid and distal left M1 segment noted. Normal left MCA bifurcation. Distal left MCA branches well perfused and fairly symmetric with the right. Posterior circulation: Calcified plaque within the V4 segments bilaterally with resultant mild stenosis. Vertebral arteries otherwise patent to the vertebrobasilar junction. Posterior inferior cerebral arteries patent proximally. Mild atheromatous irregularity within the basilar artery without significant stenosis. Superior cerebral arteries patent bilaterally. Both of the posterior cerebral arteries primarily supplied via the basilar and are widely patent to their distal aspects. Venous sinuses: Patent. Anatomic variants: None significant. Delayed phase: No abnormal enhancement. Review of the MIP images confirms the above findings IMPRESSION: 1. Negative CTA for large vessel occlusion. 2. Approximate 60-70% atheromatous stenosis at the right carotid bifurcation. 3. Short-segment mild to moderate bilateral V4 stenoses. 4. Otherwise wide patency of the major arterial vasculature of the head and neck. 5. Layering left pleural effusion with associated atelectasis. Electronically Signed   By: Rise Mu M.D.   On: 03/07/2018 17:31   Mr Brain Wo Contrast  Result Date: 03/06/2018 CLINICAL DATA:  85 year old male code stroke presentation with left side facial droop weakness and slurred speech. EXAM: MRI HEAD WITHOUT CONTRAST TECHNIQUE: Multiplanar, multiecho pulse sequences of the brain and surrounding structures were obtained without intravenous contrast. COMPARISON:  Head CT earlier today. Brain MRI 01/31/2018 and earlier. FINDINGS: Brain: Study is intermittently degraded by motion artifact despite repeated imaging attempts. No  restricted diffusion to suggest acute infarction. No midline shift, mass effect, evidence of mass lesion, extra-axial collection or acute intracranial hemorrhage. Cervicomedullary junction and pituitary are within normal limits. Stable cerebral volume and ex vacuo appearing ventricular enlargement. Scattered cerebral white matter T2 and FLAIR hyperintensity appears stable and is most pronounced in the external capsules. No new signal abnormality. No chronic cerebral blood products are evident Vascular: Major intracranial vascular flow voids are stable with mild to moderate generalized intracranial artery ectasia. Skull and upper cervical spine: Negative visible cervical spine. Normal bone marrow signal. Sinuses/Orbits: Stable, negative. Other: Stable trace mastoid fluid. Negative scalp and face soft tissues. IMPRESSION: 1.  No acute intracranial abnormality. 2. Stable MRI appearance of the brain since 2019. Electronically Signed   By: Odessa Fleming M.D.   On: 03/06/2018 15:29   Mr Thoracic Spine W Wo Contrast  Addendum Date: 03/06/2018   ADDENDUM REPORT: 03/06/2018 16:41 ADDENDUM: Study discussed by telephone with Dr. Bruce Donath in the ED on 03/06/2018 at 1635 hours. Electronically Signed   By: Odessa Fleming M.D.   On: 03/06/2018 16:41   Result Date: 03/06/2018 CLINICAL DATA:  85 year old male with back pain, possible cauda equina syndrome. History of discitis osteomyelitis and phlegmon in the lower thoracic spine last month T7 through T10. Possible early discitis osteomyelitis at L4-L5 and L5-S1 at that time. 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 EXAM: MRI THORACIC WITHOUT AND WITH CONTRAST TECHNIQUE: Multiplanar and multiecho pulse sequences of the thoracic spine were obtained without and with intravenous contrast. CONTRAST:  10 milliliters Gadavist in conjunction with contrast enhanced imaging of the lumbar spine reported separately. COMPARISON:  Lumbar spine MRI today reported separately. Thoracic MRI 01/31/2018.  FINDINGS: Limited cervical spine imaging: Stable since January. Intermittent cervical spine ankylosis. Thoracic spine segmentation: Normal, concordant with the numbering in January and on the lumbar MRI today. Alignment: Stable since January. Straightening of lower thoracic kyphosis. Vertebrae: Regressed but not resolved marrow edema in the T8 vertebral body. Stable to slightly regressed marrow edema in T9 and T10. No new levels of edema. Cord: Abnormal spinal cord signal beginning at the T8 level and continuing caudally with increased cord T2 and STIR signal (series 19, image 9). This continues into the lower thoracic cord and conus and is more apparent than on the lumbar images today. No abnormal cord enhancement is identified. But there is abnormal dural thickening and enhancement from T7-T8 through L1 which has not improved since January. There is multifactorial spinal cord compression at T9-T10 (series 27, image 26 and series 22, image 26). Above T8 spinal cord, dura and the epidural space appear within normal limits. Paraspinal and other soft tissues: Stable visible chest and upper abdominal viscera. Enhancing paraspinal phlegmon with epicenter at T9-T10 has not significantly changed since January (series 24, image 26). There is paraspinal soft tissue edema, but no drainable paraspinal fluid collection. The epidural space there is further described below. Disc levels: C7-T1: Stable increased STIR signal in the disc space without evidence of active inflammation. T1-T2 through T6-T7: Stable, T4-T5 chronic ankylosis. T7-T8: Dural thickening and enhancement. No spinal  or foraminal stenosis. T8-T9: Paraspinal phlegmon. Abnormal signal in the disc. Dural thickening and enhancement. Superimposed posterior element hypertrophy. No epidural fluid collection. Spinal stenosis with mild spinal cord mass effect. Mild to moderate left T8 foraminal stenosis. T9-T10: Partially eroded T9 and T10 endplates with bulky endplate  and paraspinal phlegmon and fluid in the disc space. Epidural space inflammation, dural thickening and enhancement. No epidural fluid collection is evident. Spinal stenosis with moderate spinal cord mass effect (series 22, image 26), not significantly changed since January. Severe bilateral T9 foraminal stenosis. T10-T11: Faint abnormal signal in the disc space. Dural thickening and enhancement. Epidural inflammation. No epidural fluid. Mild spinal stenosis. Mild to moderate bilateral T10 foraminal stenosis. T11-T12: Degenerated but otherwise negative disc space. Mild dural thickening and enhancement. No marrow edema at this level. Mild degenerative spinal stenosis and mild to moderate left greater than right T11 foraminal stenosis. T12-L1: Ankylosis with chronic mild spinal stenosis. Mild dural thickening and enhancement here might be chronic. Chronic cord myelomalacia here, and moderate to severe bilateral T12 foraminal stenosis. IMPRESSION: 1. Spinal infection T7-T8 through T11-T12 has not significantly changed since January. Associated spinal cord involvement (see #2). Discitis osteomyelitis at T9-T10, and probably also T8-T9. Dural thickening and enhancement at the other levels. Bulky paraspinal phlegmon epicenter at T9-T10, but no drainable fluid other than that in the T9-T10 disc space. 2. Spinal stenosis throughout those levels with moderate Cord Compression at T9-T10. Spinal Cord Edema from T8 inferiorly. Superimposed chronic cord myelomalacia at T12-L1. 3. No new levels of infection. 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 Electronically Signed: By: Odessa Fleming M.D. On: 03/06/2018 16:31   Mr Lumbar Spine W Wo Contrast  Result Date: 03/06/2018 CLINICAL DATA:  85 year old male with back pain, possible cauda equina syndrome. History of discitis osteomyelitis and phlegmon in the lower thoracic spine last month T7 through T10. Possible early discitis osteomyelitis at L4-L5 and L5-S1 at that time. Chronic  discitis osteomyelitis at T12-L1. EXAM: MRI LUMBAR SPINE WITHOUT AND WITH CONTRAST TECHNIQUE: Multiplanar and multiecho pulse sequences of the lumbar spine were obtained without and with intravenous contrast. CONTRAST:  10 milliliters Gadavist in conjunction with contrast enhanced imaging of the thoracic reported separately. COMPARISON:  Thoracic MRI today reported separately. Lumbar MRI 01/31/2018. FINDINGS: Segmentation: Same numbering system as used in January which designates a vestigial S1-S2 disc space. Alignment:  Stable since January.  No lumbar spondylolisthesis. Vertebrae: The mild marrow edema at the L5-S1 level in January has resolved. There is no evidence of lumbar or upper sacral discitis osteomyelitis. No acute osseous abnormality identified. Conus medullaris and cauda equina: chronic myelomalacia in the lower thoracic spinal cord at T12-L1 appears stable. The conus is at L1-L2 and stable. Cauda equina nerve roots are stable. No abnormal intradural enhancement. No lumbar dural thickening. Paraspinal and other soft tissues: Distended urinary bladder similar to the prior study. Negative visible abdominal viscera. Diffuse paraspinal muscle atrophy is stable, relatively sparing the left psoas muscle. Chronic postoperative changes posteriorly at T12-L1, L3-L4 through the sacrum. Disc levels: Stable since January. T12-L1 ankylosis with posterior disc osteophyte complex with no high-grade spinal stenosis but chronic spinal cord myelomalacia at that level. Ankylosed L2 through L5 vertebrae. Trace increased STIR signal in the L5-S1 disc and posterior elements appears degenerative. Severe multifactorial spinal stenosis at L5-S1 (series 14, image 27), due to bulky disc osteophyte complex and posterior element hypertrophy. Severe left L5 foraminal stenosis. IMPRESSION: 1. No acute or inflammatory process in the lumbar spine. The  mild marrow edema at L5-S1 in January has resolved. 2. Severe chronic multifactorial  spinal and left foraminal stenosis at L5-S1. 3. No acute osseous abnormality. Chronic ankylosed T12-L1 and L2 through L5 levels. 4. Chronic lower thoracic spinal cord myelomalacia at T12-L1. 5. Distended urinary bladder. Electronically Signed   By: Odessa Fleming M.D.   On: 03/06/2018 16:15   Dg Chest Portable 1 View  Result Date: 03/06/2018 CLINICAL DATA:  Possible stroke.  Found down. EXAM: PORTABLE CHEST 1 VIEW COMPARISON:  02/16/2018 FINDINGS: The heart is enlarged but stable. There is tortuosity and calcification of the thoracic aorta. The right PICC line is stable. Low lung volumes with vascular crowding and streaky basilar atelectasis. No definite pleural effusions. The bony thorax is intact. IMPRESSION: Cardiac enlargement. Low lung volumes with streaky basilar atelectasis but no definite infiltrates or large effusions. Electronically Signed   By: Rudie Meyer M.D.   On: 03/06/2018 10:30   Ct Head Code Stroke Wo Contrast  Result Date: 03/06/2018 CLINICAL DATA:  Code stroke. Left-sided facial droop, weakness, and slurred speech EXAM: CT HEAD WITHOUT CONTRAST TECHNIQUE: Contiguous axial images were obtained from the base of the skull through the vertex without intravenous contrast. COMPARISON:  02/16/2018 FINDINGS: Brain: No evidence of acute infarction, hemorrhage, hydrocephalus, extra-axial collection or mass lesion/mass effect. Remote right corona radiata infarct. Advanced atrophy with ventriculomegaly Vascular: No hyperdense vessel. Extensive atherosclerotic calcification. Skull: No acute finding Sinuses/Orbits: Bilateral cataract resection Other: These results were communicated to Dr. Otelia Limes at 10:04 amon 2/9/2020by text page via the [facility] messaging system. ASPECTS [facility] Stroke Program Early CT Score) - Ganglionic level infarction (caudate, lentiform nuclei, internal capsule, insula, M1-M3 cortex): 7 - Supraganglionic infarction (M4-M6 cortex): 3 Total score (0-10 with 10 being normal): 10 IMPRESSION:  1. No acute finding or change from prior. 2. Advanced atrophy. Electronically Signed   By: Marnee Spring M.D.   On: 03/06/2018 10:05      Assessment/Plan:  INTERVAL HISTORY: Changed him over to nafcillin yesterday  Principal Problem:   Altered mental status Active Problems:   Essential hypertension   Lower extremity weakness   Anemia in chronic kidney disease   Chronic kidney disease, stage III (moderate) (HCC)   Atrial fibrillation (HCC)   Hypothyroidism   DM type 2 (diabetes mellitus, type 2) (HCC)   Prosthetic hip infection (HCC)   Osteomyelitis of thoracic spine (HCC)   Spinal epidural abscess    [name] is a 85 y.o. 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  He most recently has been dealing with recurrent discitis with an epidural abscess and is status post aspiration of the abscess in January with nothing growing on culture in the context of chronic suppressive Keflex.  He was on cefazolin but then readmitted and found to have progression of his disc of his vertebral infection with cord compression.  Not a candidate for neurosurgical intervention.  Is been confused and hallucinating.  While I think his confusion and hallucinations are due to other things that infection I did change him to an antistaphylococcal agent that penetrates the central nervous system namely nafcillin.  Unfortunately this is an incurable problem and I think that we should ultimately pivot towards palliative care.  In the interim however I will continue with nafcillin.  If the team and the family are interested in more aggressive maneuvers one could do an interventional radiology guided  aspirate of the vertebral body and disc space and send this to culture to try to prove that he has some other organism than what we know he has.     LOS: 2 days   Acey Lav 03/08/2018, 8:30 AM

## 2018-03-08 NOTE — Care Management Note (Signed)
Case Management Note  Patient Details  Name: [name] MRN: [id number] Date of Birth: 01/05/1931  Subjective/Objective:  Pt admitted on 03/06/2018 with episodic confusion, progressive weakness and left facial droop.  Pt currently with spinal epidural abscess/vertebral osteomyelitis and phlegmon, being treated with IV abx.  PTA, pt resided at [facility]; he has been essentially bed bound for the last year.                   Action/Plan: Palliative consult ordered to assist with goals of care.  Will follow/assist with discharge planning as pt progresses.    Expected Discharge Date:                  Expected Discharge Plan:  Skilled Nursing Facility  In-House Referral:  Hospice / Palliative Care  Discharge planning Services  CM Consult  Post Acute Care Choice:    Choice offered to:     DME Arranged:    DME Agency:     HH Arranged:    HH Agency:     Status of Service:  In process, will continue to follow  If discussed at Long Length of Stay Meetings, dates discussed:    Additional Comments:  Quintella BatonJulie W. Loukisha Gunnerson, RN, BSN  Trauma/Neuro ICU Case Manager [phone]

## 2018-03-08 NOTE — Consult Note (Signed)
Consultation Note Date: 03/08/2018   Patient Name: [name]  DOB: [birth year]  MRN: [id number]  Age / Sex: 85 y.o., male  PCP: Lilian Coma., MD Referring Physician: Roxan Hockey, MD  Reason for Consultation: Establishing goals of care  HPI/Patient Profile: 85 y.o. male  with past medical history of h/o recurrent MSSA right hip infection, pacemaker removal s/t infection, HTN, diabetes, dementia, depression/anxiety, recent admission/diagnosis with vertebral osteomyelitis currently receiving IV antibiotics admitted on 03/06/2018 with AMS with concern for stroke. Stroke ruled out. However, MRI results with concern for destruction of T9 with cord compression s/t discitis/osteomyelitis. Not a surgical candidate. ID has adjusted antibiotics but prognosis remains poor. Neurology has also suggested likely diagnosis of Lewy body dementia.   Clinical Assessment and Goals of Care: I met today at [name]'s bedside with his daughter, [name]. His wife, [name], joins [place] shortly after I enter. We were also joined by Dr. Denton Brick for part of our discussion. We discussed at length [name]'s worsening infection and ultimately poor prognosis. Discussed declining QOL and symptom burden of continued aggressive care. Wife says that he has always requested resuscitation but then also says he would not want to be hooked up to machines.   Luckily after Dr. Denton Brick left [name] aroused and was able to participate in conversation. At the time of this conversation he was very clear and exhibited full understanding of conversation and decisions at hand. We discussed above and he was able to clarify his wishes. He is clear that he would not want resuscitation and at the end of his life he wants comfort and minimized suffering. He makes several religious references and tells me that he knows where he is going. He seems to  have made peace and tells me "I have lived 11 years." He has had a full life. He goes on to request a visit from his personal pastors and speaks of requests he would like at his funeral.   I was able to speak with [name] who has some healthcare knowledge and background about consideration of transition to comfort and utilization of hospice with further decline and that this could be completed even from SNF if they wished to avoid hospitalization. She has full understanding. Left Hard Choices booklet with wife and will bring more for children.   Primary Decision Maker PATIENT with help of his wife and children (he has waxing/waning mentation and confusion)    SUMMARY OF RECOMMENDATIONS   - DNR - Continue antibiotics for now (they understand there is a time when antibiotics may not make sense) - They trust Dr. Lucianne Lei Dam's recommendation to avoid further aspiration of disc space - Outpatient palliative to follow at SNF with eventual transition to hospice in the future  Code Status/Advance Care Planning:  DNR   Symptom Management:   Aspercreme ordered for chronic shoulder pain.   Chronic muscle spasms to BLE: Consider low dose baclofen although already concern for polypharmacy.   Palliative Prophylaxis:   Aspiration, Bowel Regimen, Delirium Protocol, Frequent Pain  Assessment, Oral Care and Turn Reposition  Psycho-social/Spiritual:   Desire for further Chaplaincy support:yes - from his personal pastors  Additional Recommendations: Education on Hospice and Grief/Bereavement Support  Prognosis:   < 6 months very possible  Discharge Planning: [place] for rehab with Palliative care service follow-up      Primary Diagnoses: Present on Admission: . Essential hypertension . Lower extremity weakness . Anemia in chronic kidney disease . Chronic kidney disease, stage III (moderate) (HCC) . Atrial fibrillation ([place]) . Hypothyroidism . Osteomyelitis of thoracic spine  ([place]) . Altered mental status . Spinal epidural abscess   I have reviewed the medical record, interviewed the patient and family, and examined the patient. The following aspects are pertinent.  Past Medical History:  Diagnosis Date  . Alzheimer's type dementia ([place]) 12/07/2016  . Anemia   . Anxiety   . Arthritis   . Bacteremia   . Bradycardia    a. PPM removed in 2014 due to bacteremia.  . Chronic atrial fibrillation   . Chronic right shoulder pain 06/17/2015  . CKD (chronic kidney disease), stage III ([place])   . Complication of anesthesia    " Terrible Nightmares"  . Constipation   . Depression   . Diabetes mellitus without complication ([place])    Type II  . DVT (deep venous thrombosis) ([place])    a. in 2019 - pt reports behind knee - was on Xarelto 46m BID now 246mdaily.  . Encounter for removal of cardiac resynchronization therapy pacemaker 2017  . Grief 06/17/2015  . History of blood product transfusion   . Hypertension   . Hypothyroidism   . [place] ([place]  . Major depression 06/17/2015  . MSSA (methicillin susceptible Staphylococcus aureus) infection   . OAB (overactive bladder)   . Overactive bladder   . Septic hip ([facility]  . Septicemia ([facility]  . Severe depression ([place]/12/2016  . Staphylococcus aureus infection   . Thoracic spine pain 11/28/2014   Social History   Socioeconomic History  . Marital status: Married    Spouse name: Not on file  . Number of children: Not on file  . Years of education: Not on file  . Highest education level: Not on file  Occupational History  . Not on file  Social Needs  . Financial resource strain: Not on file  . Food insecurity:    Worry: Not on file    Inability: Not on file  . Transportation needs:    Medical: Not on file    Non-medical: Not on file  Tobacco Use  . Smoking status: Never Smoker  . Smokeless tobacco: Never Used  Substance and Sexual Activity  . Alcohol use: No    Alcohol/week: 0.0 standard  drinks  . Drug use: No  . Sexual activity: Not on file  Lifestyle  . Physical activity:    Days per week: Not on file    Minutes per session: Not on file  . Stress: Not on file  Relationships  . Social connections:    Talks on phone: Not on file    Gets together: Not on file    Attends religious service: Not on file    Active member of club or organization: Not on file    Attends meetings of clubs or organizations: Not on file    Relationship status: Not on file  Other Topics Concern  . Not on file  Social History Narrative  . Not on file  Family History  Problem Relation Age of Onset  . Heart failure Mother    Scheduled Meds: . buPROPion  100 mg Oral BID  . divalproex  125 mg Oral QHS  . docusate sodium  100 mg Oral BID  . escitalopram  10 mg Oral Daily  . ferrous sulfate  325 mg Oral BID WC  . finasteride  5 mg Oral QHS  . insulin aspart  0-5 Units Subcutaneous QHS  . insulin aspart  0-9 Units Subcutaneous TID WC  . lactulose  10 g Oral QHS  . lactulose  30 g Oral Daily  . levothyroxine  88 mcg Oral Q0600  . pregabalin  75 mg Oral QHS  . rivaroxaban  20 mg Oral Q supper  . rOPINIRole  0.5 mg Oral QHS  . terazosin  5 mg Oral QHS  . tiZANidine  2 mg Oral TID  . torsemide  10 mg Oral Daily  . vitamin C  500 mg Oral BID   Continuous Infusions: . sodium chloride 100 mL/hr at 03/07/18 1830  . dextrose 5% lactated ringers 50 mL/hr at 03/06/18 2159  . nafcillin IV 2 g (03/08/18 1016)   PRN Meds:.acetaminophen **OR** acetaminophen, HYDROmorphone (DILAUDID) injection, ondansetron **OR** ondansetron (ZOFRAN) IV Allergies  Allergen Reactions  . Ace Inhibitors Cough  . Gabapentin Other (See Comments)    Unknown reaction  . Nsaids Other (See Comments)    Renal insufficiency   . Zolpidem Other (See Comments)    hallucinations Nightmares, irritable, nervous Disorientation   Review of Systems  Constitutional: Positive for activity change, appetite change and  fatigue.  Musculoskeletal: Positive for arthralgias and myalgias.    Physical Exam Vitals signs and nursing note reviewed.  Constitutional:      Appearance: He is overweight. He is ill-appearing.  Cardiovascular:     Rate and Rhythm: Normal rate.  Pulmonary:     Effort: Pulmonary effort is normal. No tachypnea, accessory muscle usage or respiratory distress.  Abdominal:     Palpations: Abdomen is soft.  Neurological:     Comments: Alertness and orientation fluctuating. He is oriented and very insightful at times. Lethargic.      Vital Signs: BP 139/88 (BP Location: Left Arm)   Pulse 70   Temp 98.2 F (36.8 C) (Axillary)   Resp 20   Ht _0  (1.803 m)   Wt 111 kg   SpO2 97%   BMI 34.13 kg/m  Pain Scale: 0-10   Pain Score: Asleep   SpO2: SpO2: 97 % O2 Device:SpO2: 97 % O2 Flow Rate: .O2 Flow Rate (L/min): 2 L/min  IO: Intake/output summary:   Intake/Output Summary (Last 24 hours) at 03/08/2018 1132 Last data filed at 03/08/2018 0400 Gross per 24 hour  Intake 3934.96 ml  Output 1426 ml  Net 2508.96 ml    LBM: Last BM Date: 03/07/18 Baseline Weight: Weight: 111 kg Most recent weight: Weight: 111 kg     Palliative Assessment/Data: 30% at best   Flowsheet Rows     Most Recent Value  Intake Tab  Referral Department  Hospitalist  Unit at Time of Referral  ER  Palliative Care Primary Diagnosis  Neurology  Date Notified  03/07/18  Palliative Care Type  New Palliative care  Reason for referral  Clarify Goals of Care  Date of Admission  03/06/18  # of days IP prior to Palliative referral  1  Clinical Assessment  Psychosocial & Spiritual Assessment  Palliative Care Outcomes  Time In: 1000 Time Out: 1130 Time Total: 90 min Greater than 50%  of this time was spent counseling and coordinating care related to the above assessment and plan.  Signed by: Vinie Sill, NP Palliative Medicine Team Pager # [phone] (M-F 8a-5p) Team Phone #  [phone] (Nights/Weekends)

## 2018-03-08 NOTE — Social Work (Signed)
Pt from [facility], CSW continuing to follow for support with disposition when medically appropriate.  Octavio Graves, MSW, [facility] Clinical Social Work [phone]

## 2018-03-08 NOTE — Progress Notes (Signed)
Initial Nutrition Assessment  DOCUMENTATION CODES:   Obesity unspecified  INTERVENTION:  Provide Ensure Enlive po BID, each supplement provides 350 kcal and 20 grams of protein.  Encourage adequate PO intake.   NUTRITION DIAGNOSIS:   Increased nutrient needs related to acute illness as evidenced by estimated needs.  GOAL:   Patient will meet greater than or equal to 90% of their needs  MONITOR:   PO intake, Supplement acceptance, Labs, Weight trends, I & O's, Skin  REASON FOR ASSESSMENT:   Low Braden    ASSESSMENT:   85 y.o. male with a PMHx of HTN, hypothyroidism, chronic a. Fib ( on xarelto), DM, CKD 3, lumbar spine osteomyelitis (on long term abx with PICC line at this time) admitted from facility on 03/06/2018 with strokelike symptoms, on presentation patient had altered mentation with left-sided droop, speech disturbance and left-sided extremity weakness, as well as bowel and urinary incontinence. Stroke work-up including brain MRI negative. Progressive destructive thoracic and lumbar discitis and vertebral osteomyelitis with cord compression.  Pt asleep during time of visit and did not awaken to RD assessment. No family at bedside. RD unable to obtain most recent nutrition history. No weight loss per weight records. Meal completion has been 10%. RD to order nutritional supplements to aid in caloric and protein needs. Labs and medications reviewed.   NUTRITION - FOCUSED PHYSICAL EXAM:    Most Recent Value  Orbital Region  No depletion  Upper Arm Region  No depletion  Thoracic and Lumbar Region  No depletion  Buccal Region  No depletion  Temple Region  No depletion  Clavicle Bone Region  No depletion  Clavicle and Acromion Bone Region  No depletion  Scapular Bone Region  Unable to assess  Dorsal Hand  No depletion  Patellar Region  No depletion  Anterior Thigh Region  No depletion  Posterior Calf Region  No depletion  Edema (RD Assessment)  Mild  Hair  Reviewed   Eyes  Unable to assess  Mouth  Unable to assess  Skin  Reviewed  Nails  Reviewed       Diet Order:   Diet Order            Diet Carb Modified Fluid consistency: Thin; Room service appropriate? Yes  Diet effective now              EDUCATION NEEDS:   Not appropriate for education at this time  Skin:  Skin Assessment: Skin Integrity Issues: Skin Integrity Issues:: DTI DTI: R heel  Last BM:  2/10  Height:   Ht Readings from Last 1 Encounters:  03/06/18 5\' 11"  (1.803 m)    Weight:   Wt Readings from Last 1 Encounters:  03/06/18 111 kg    Ideal Body Weight:  78 kg  BMI:  Body mass index is 34.13 kg/m.  Estimated Nutritional Needs:   Kcal:  1900-2050  Protein:  90-100 grams  Fluid:  >/= 1.9 L/day    Roslyn Smiling, MS, RD, LDN Pager # [phone] After hours/ weekend pager # [phone]

## 2018-03-09 DIAGNOSIS — B9561 Methicillin susceptible Staphylococcus aureus infection as the cause of diseases classified elsewhere: Secondary | ICD-10-CM

## 2018-03-09 LAB — FUNGAL ORGANISM REFLEX

## 2018-03-09 LAB — FUNGUS CULTURE RESULT

## 2018-03-09 LAB — GLUCOSE, CAPILLARY
GLUCOSE-CAPILLARY: 82 mg/dL (ref 70–99)
Glucose-Capillary: 120 mg/dL — ABNORMAL HIGH (ref 70–99)

## 2018-03-09 LAB — FUNGUS CULTURE WITH STAIN

## 2018-03-09 MED ORDER — NAFCILLIN IV (FOR PTA / DISCHARGE USE ONLY): 12.0000 g | INTRAVENOUS | 0 refills | Status: DC

## 2018-03-09 NOTE — Care Management Note (Signed)
Case Management Note  Patient Details  Name: [name] MRN: [id number] Date of Birth: 1930-07-23  Subjective/Objective:  Pt admitted on 03/06/2018 with episodic confusion, progressive weakness and left facial droop.  Pt currently with spinal epidural abscess/vertebral osteomyelitis and phlegmon, being treated with IV abx.  PTA, pt resided at [facility]; he has been essentially bed bound for the last year.                   Action/Plan: Palliative consult ordered to assist with goals of care.  Will follow/assist with discharge planning as pt progresses.    Expected Discharge Date:  03/09/18               Expected Discharge Plan:  Skilled Nursing Facility  In-House Referral:  Hospice / Palliative Care, Clinical Social Work  Discharge planning Services  CM Consult  Post Acute Care Choice:    Choice offered to:     DME Arranged:    DME Agency:     HH Arranged:    HH Agency:     Status of Service:  Completed, signed off  If discussed at [employer] of [employer] Meetings, dates discussed:    Additional Comments:  03/09/18 J. Rae Sutcliffe, RN, BSN Pt medically stable for discharge today.  [facility] with palliative follow up, per CSW arrangements.  Quintella BatonJulie W. Kyllie Pettijohn, RN, BSN  Trauma/Neuro ICU Case Manager [phone]

## 2018-03-09 NOTE — Care Management Important Message (Signed)
Important Message  Patient Details  Name: [name] MRN: [id number] Date of Birth: 10-20-1930   Medicare Important Message Given:  Yes    [name] 03/09/2018, 3:34 PM

## 2018-03-09 NOTE — Clinical Social Work Placement (Signed)
   CLINICAL SOCIAL WORK PLACEMENT  NOTE Malvin Johns RN to call report to [phone]  Date:  03/09/2018  Patient Details  Name: [name] MRN: [id number] Date of Birth: 29-Sep-1930  Clinical Social Work is seeking post-discharge placement for this patient at the Skilled  Nursing Facility level of care (*CSW will initial, date and re-position this form in  chart as items are completed):  Yes   Patient/family provided with [facility] Clinical Social Work Department's list of facilities offering this level of care within the geographic area requested by the patient (or if unable, by the patient's family).  Yes   Patient/family informed of their freedom to choose among providers that offer the needed level of care, that participate in Medicare, Medicaid or managed care program needed by the patient, have an available bed and are willing to accept the patient.  Yes   Patient/family informed of 's ownership interest in [facility] and [facility], as well as of the fact that they are under no obligation to receive care at these facilities.  PASRR submitted to EDS on       PASRR number received on       Existing PASRR number confirmed on 03/09/18     FL2 transmitted to all facilities in geographic area requested by pt/family on 03/09/18     FL2 transmitted to all facilities within larger geographic area on       Patient informed that his/her managed care company has contracts with or will negotiate with certain facilities, including the following:        Yes   Patient/family informed of bed offers received.  Patient chooses bed at [facility]     Physician recommends and patient chooses bed at      Patient to be transferred to [facility] on 03/09/18.  Patient to be transferred to facility by [facility]     Patient family notified on 03/09/18 of transfer.  Name of family member notified:  pt wife [name]     PHYSICIAN       Additional Comment:     _______________________________________________ Doy Hutching, LCSWA 03/09/2018, 3:14 PM

## 2018-03-09 NOTE — Progress Notes (Signed)
Diagnosis: MSSA discitis with cord compression  Culture Result: No new culture data  Allergies  Allergen Reactions  . Ace Inhibitors Cough  . Gabapentin Other (See Comments)    Unknown reaction  . Nsaids Other (See Comments)    Renal insufficiency   . Zolpidem Other (See Comments)    hallucinations Nightmares, irritable, nervous Disorientation    OPAT Orders Discharge antibiotics: Still on 12 g/day via continuous infusion  Duration: 2 weeks End Date: March 23, 2018  [facility]  Labs weekly while on IV antibiotics: _x_ CBC with differential _x_ BMP  __ Please pull PIC at completion of IV antibiotics _x_ Please leave PIC in place until doctor has seen patient or been notified  Fax weekly labs to [phone]  Clinic Follow Up Appt:  March 22, 2018 with me at [id number]

## 2018-03-09 NOTE — Progress Notes (Signed)
Subjective:  Looks much better today and is in much better spirits and recognize me  He even took a bit of time to say a prayer on my behalf   Antibiotics:  Anti-infectives (From admission, onward)   Start     Dose/Rate Route Frequency Ordered Stop   03/07/18 2000  nafcillin injection 2 g  Status:  Discontinued     2 g Intravenous Every 4 hours 03/07/18 1603 03/07/18 1742   03/07/18 2000  nafcillin 2 g in sodium chloride 0.9 % 100 mL IVPB     2 g 200 mL/hr over 30 Minutes Intravenous Every 4 hours 03/07/18 1742     03/06/18 2200  ceFAZolin (ANCEF) IVPB  Status:  Discontinued    Note to Pharmacy:  Indication:  MSSA epidural abscess and discitis/osteomyelitis   2 g Intravenous Every 8 hours 03/06/18 1756 03/06/18 1829   03/06/18 2200  ceFAZolin (ANCEF) IVPB 2g/100 mL premix  Status:  Discontinued     2 g 200 mL/hr over 30 Minutes Intravenous Every 8 hours 03/06/18 1830 03/07/18 1603      Medications: Scheduled Meds: . buPROPion  100 mg Oral BID  . divalproex  125 mg Oral QHS  . docusate sodium  100 mg Oral BID  . escitalopram  10 mg Oral Daily  . feeding supplement (ENSURE ENLIVE)  237 mL Oral BID BM  . ferrous sulfate  325 mg Oral BID WC  . finasteride  5 mg Oral QHS  . insulin aspart  0-5 Units Subcutaneous QHS  . insulin aspart  0-9 Units Subcutaneous TID WC  . lactulose  10 g Oral QHS  . lactulose  30 g Oral Daily  . levothyroxine  88 mcg Oral Q0600  . pregabalin  75 mg Oral QHS  . rivaroxaban  20 mg Oral Q supper  . rOPINIRole  0.5 mg Oral QHS  . terazosin  5 mg Oral QHS  . tiZANidine  2 mg Oral TID  . torsemide  10 mg Oral Daily  . vitamin C  500 mg Oral BID   Continuous Infusions: . sodium chloride 100 mL/hr at 03/07/18 1830  . dextrose 5% lactated ringers 50 mL/hr at 03/06/18 2159  . nafcillin IV 2 g (03/09/18 0900)   PRN Meds:.acetaminophen **OR** acetaminophen, HYDROmorphone (DILAUDID) injection, MUSCLE RUB, ondansetron **OR** ondansetron  (ZOFRAN) IV    Objective: Weight change:   Intake/Output Summary (Last 24 hours) at 03/09/2018 1214 Last data filed at 03/09/2018 0850 Gross per 24 hour  Intake 480 ml  Output 1100 ml  Net -620 ml   Blood pressure 119/64, pulse 67, temperature 98.4 F (36.9 C), temperature source Oral, resp. rate 18, height 5\' 11"  (1.803 m), weight 111 kg, SpO2 99 %. Temp:  [98 F (36.7 C)-98.8 F (37.1 C)] 98.4 F (36.9 C) (02/12 1114) Pulse Rate:  [67-87] 67 (02/12 1114) Resp:  [15-21] 18 (02/12 1114) BP: (118-145)/(64-91) 119/64 (02/12 1114) SpO2:  [97 %-99 %] 99 % (02/12 1114)  Physical Exam: General: Completely awake recognize me immediately and we had an extensive conversation along with his daughter. HEENT: anicteric sclera, EOMI CVS regular rate, normal  Chest: , no wheezing, no respiratory distress Abdomen: soft non-distended,  Extremities: no edema or deformity noted bilaterally Skin: no rashes Neuro: No new focal findings  CBC:    BMET Recent Labs    03/07/18 0443  NA 144  K 4.0  CL 109  CO2 27  GLUCOSE 75  BUN 11  CREATININE 1.12  CALCIUM 8.7*     Liver Panel  No results for input(s): PROT, ALBUMIN, AST, ALT, ALKPHOS, BILITOT, BILIDIR, IBILI in the last 72 hours.     Sedimentation Rate No results for input(s): ESRSEDRATE in the last 72 hours. C-Reactive Protein No results for input(s): CRP in the last 72 hours.  Micro Results: Recent Results (from the past 720 hour(s))  Blood culture (routine x 2)     Status: None (Preliminary result)   Collection Time: 03/06/18 10:32 AM  Result Value Ref Range Status   Specimen Description BLOOD LEFT HAND  Final   Special Requests   Final    BOTTLES DRAWN AEROBIC AND ANAEROBIC Blood Culture adequate volume   Culture   Final    NO GROWTH 3 DAYS Performed at [facility], [address], [place], [place] [address]    Report Status PENDING  Incomplete  Urine culture     Status: Abnormal   Collection Time:  03/06/18 10:40 AM  Result Value Ref Range Status   Specimen Description URINE, RANDOM  Final   Special Requests NONE  Final   Culture (A)  Final    <10,000 COLONIES/mL INSIGNIFICANT GROWTH Performed at [facility], [address] [place], [place] [address]    Report Status 03/07/2018 FINAL  Final  Blood culture (routine x 2)     Status: None (Preliminary result)   Collection Time: 03/06/18 10:57 AM  Result Value Ref Range Status   Specimen Description BLOOD LEFT ANTECUBITAL  Final   Special Requests   Final    BOTTLES DRAWN AEROBIC AND ANAEROBIC Blood Culture adequate volume   Culture   Final    NO GROWTH 3 DAYS Performed at [facility], [address], [place], [place] [address]    Report Status PENDING  Incomplete  MRSA PCR Screening     Status: None   Collection Time: 03/07/18  7:21 AM  Result Value Ref Range Status   MRSA by PCR NEGATIVE NEGATIVE Final    Comment:        The GeneXpert MRSA Assay (FDA approved for NASAL specimens only), is one component of a comprehensive MRSA colonization surveillance program. It is not intended to diagnose MRSA infection nor to guide or monitor treatment for MRSA infections. Performed at [facility], [address], [place], [place] [address]     Studies/Results: Ct Angio Head W Or Wo Contrast  Result Date: 03/07/2018 CLINICAL DATA:  Initial evaluation for stroke. EXAM: CT ANGIOGRAPHY HEAD AND NECK TECHNIQUE: Multidetector CT imaging of the head and neck was performed using the standard protocol during bolus administration of intravenous contrast. Multiplanar CT image reconstructions and MIPs were obtained to evaluate the vascular anatomy. Carotid stenosis measurements (when applicable) are obtained utilizing NASCET criteria, using the distal internal carotid diameter as the denominator. CONTRAST:  75mL ISOVUE-370 IOPAMIDOL (ISOVUE-370) INJECTION 76%, <See Chart> ISOVUE-370 IOPAMIDOL (ISOVUE-370) INJECTION 76%  COMPARISON:  Prior CT and MRI from 03/06/2018 FINDINGS: CT HEAD FINDINGS Brain: Advanced cerebral atrophy with chronic microvascular ischemic disease again noted, stable. No acute intracranial hemorrhage. No acute large vessel territory infarct. No mass lesion, midline shift or mass effect. Stable ventricular prominence related to global parenchymal volume loss without hydrocephalus. No extra-axial fluid collection. Vascular: Calcified atherosclerosis present at the skull base. Skull: Calvarium and scalp soft tissues demonstrate no acute finding. Sinuses: Chronic mucoperiosteal thickening present within the ethmoidal air cells and maxillary sinuses.  No mastoid effusion. Orbits: Patient status post bilateral ocular lens replacement. Review of the MIP images confirms the above findings CTA NECK FINDINGS Aortic arch: Visualized aortic arch of normal caliber with normal 3 vessel morphology. Moderate atheromatous plaque about the aortic arch with no hemodynamically significant stenosis about the origin of the great vessels. Visualized subclavian arteries widely patent. Right carotid system: Right CCA tortuous proximally but widely patent to the bifurcation without significant stenosis. Evaluation the right carotid bifurcation limited by motion artifact. Bulky calcified atheromatous plaque at the right bifurcation with up to approximately 60-70% stenosis by NASCET criteria. Right ICA mildly tortuous but otherwise widely patent distally to the skull base. Left carotid system: Left CCA tortuous proximally. Mixed plaque at the distal left CCA/bifurcation with associated stenosis of up to 25% by NASCET criteria. Left ICA mildly tortuous but otherwise widely patent to the skull base without additional stenosis. Vertebral arteries: Both of the vertebral arteries arise from the subclavian arteries. Vertebral arteries widely patent within the neck without stenosis, dissection, or occlusion. Skeleton: No acute osseous abnormality.  No discrete lytic or blastic osseous lesions. Bulky bridging osteophytic spurring noted throughout the cervicothoracic spine, suggesting DISH. OPLL present at C2-3. Prominent degenerative thickening at the tectorial membrane. Other neck: No other soft tissue abnormality within the neck. Upper chest: Right subclavian approach central venous catheter partially visualized. Layering left pleural effusion with associated atelectasis partially visualized as well. Remainder the visualized upper chest demonstrates no other acute finding. Review of the MIP images confirms the above findings CTA HEAD FINDINGS Anterior circulation: Petrous segments widely patent bilaterally. Smooth diffuse atheromatous plaque within the cavernous/supraclinoid ICAs with mild diffuse narrowing. ICA termini widely patent bilaterally. A1 segments widely patent. Normal anterior communicating artery. Anterior cerebral arteries widely patent to their distal aspects without stenosis. Right M1 widely patent. Normal right MCA bifurcation. Distal right MCA branches well perfused and widely patent. Mild atheromatous irregularity with resultant mild diffuse narrowing of the mid and distal left M1 segment noted. Normal left MCA bifurcation. Distal left MCA branches well perfused and fairly symmetric with the right. Posterior circulation: Calcified plaque within the V4 segments bilaterally with resultant mild stenosis. Vertebral arteries otherwise patent to the vertebrobasilar junction. Posterior inferior cerebral arteries patent proximally. Mild atheromatous irregularity within the basilar artery without significant stenosis. Superior cerebral arteries patent bilaterally. Both of the posterior cerebral arteries primarily supplied via the basilar and are widely patent to their distal aspects. Venous sinuses: Patent. Anatomic variants: None significant. Delayed phase: No abnormal enhancement. Review of the MIP images confirms the above findings IMPRESSION: 1.  Negative CTA for large vessel occlusion. 2. Approximate 60-70% atheromatous stenosis at the right carotid bifurcation. 3. Short-segment mild to moderate bilateral V4 stenoses. 4. Otherwise wide patency of the major arterial vasculature of the head and neck. 5. Layering left pleural effusion with associated atelectasis. Electronically Signed   By: Rise MuBenjamin  McClintock M.D.   On: 03/07/2018 17:31   Ct Angio Neck W Or Wo Contrast  Result Date: 03/07/2018 CLINICAL DATA:  Initial evaluation for stroke. EXAM: CT ANGIOGRAPHY HEAD AND NECK TECHNIQUE: Multidetector CT imaging of the head and neck was performed using the standard protocol during bolus administration of intravenous contrast. Multiplanar CT image reconstructions and MIPs were obtained to evaluate the vascular anatomy. Carotid stenosis measurements (when applicable) are obtained utilizing NASCET criteria, using the distal internal carotid diameter as the denominator. CONTRAST:  75mL ISOVUE-370 IOPAMIDOL (ISOVUE-370) INJECTION 76%, <See Chart> ISOVUE-370 IOPAMIDOL (ISOVUE-370) INJECTION 76% COMPARISON:  Prior CT and MRI from 03/06/2018 FINDINGS: CT HEAD FINDINGS Brain: Advanced cerebral atrophy with chronic microvascular ischemic disease again noted, stable. No acute intracranial hemorrhage. No acute large vessel territory infarct. No mass lesion, midline shift or mass effect. Stable ventricular prominence related to global parenchymal volume loss without hydrocephalus. No extra-axial fluid collection. Vascular: Calcified atherosclerosis present at the skull base. Skull: Calvarium and scalp soft tissues demonstrate no acute finding. Sinuses: Chronic mucoperiosteal thickening present within the ethmoidal air cells and maxillary sinuses. No mastoid effusion. Orbits: Patient status post bilateral ocular lens replacement. Review of the MIP images confirms the above findings CTA NECK FINDINGS Aortic arch: Visualized aortic arch of normal caliber with normal 3  vessel morphology. Moderate atheromatous plaque about the aortic arch with no hemodynamically significant stenosis about the origin of the great vessels. Visualized subclavian arteries widely patent. Right carotid system: Right CCA tortuous proximally but widely patent to the bifurcation without significant stenosis. Evaluation the right carotid bifurcation limited by motion artifact. Bulky calcified atheromatous plaque at the right bifurcation with up to approximately 60-70% stenosis by NASCET criteria. Right ICA mildly tortuous but otherwise widely patent distally to the skull base. Left carotid system: Left CCA tortuous proximally. Mixed plaque at the distal left CCA/bifurcation with associated stenosis of up to 25% by NASCET criteria. Left ICA mildly tortuous but otherwise widely patent to the skull base without additional stenosis. Vertebral arteries: Both of the vertebral arteries arise from the subclavian arteries. Vertebral arteries widely patent within the neck without stenosis, dissection, or occlusion. Skeleton: No acute osseous abnormality. No discrete lytic or blastic osseous lesions. Bulky bridging osteophytic spurring noted throughout the cervicothoracic spine, suggesting DISH. OPLL present at C2-3. Prominent degenerative thickening at the tectorial membrane. Other neck: No other soft tissue abnormality within the neck. Upper chest: Right subclavian approach central venous catheter partially visualized. Layering left pleural effusion with associated atelectasis partially visualized as well. Remainder the visualized upper chest demonstrates no other acute finding. Review of the MIP images confirms the above findings CTA HEAD FINDINGS Anterior circulation: Petrous segments widely patent bilaterally. Smooth diffuse atheromatous plaque within the cavernous/supraclinoid ICAs with mild diffuse narrowing. ICA termini widely patent bilaterally. A1 segments widely patent. Normal anterior communicating artery.  Anterior cerebral arteries widely patent to their distal aspects without stenosis. Right M1 widely patent. Normal right MCA bifurcation. Distal right MCA branches well perfused and widely patent. Mild atheromatous irregularity with resultant mild diffuse narrowing of the mid and distal left M1 segment noted. Normal left MCA bifurcation. Distal left MCA branches well perfused and fairly symmetric with the right. Posterior circulation: Calcified plaque within the V4 segments bilaterally with resultant mild stenosis. Vertebral arteries otherwise patent to the vertebrobasilar junction. Posterior inferior cerebral arteries patent proximally. Mild atheromatous irregularity within the basilar artery without significant stenosis. Superior cerebral arteries patent bilaterally. Both of the posterior cerebral arteries primarily supplied via the basilar and are widely patent to their distal aspects. Venous sinuses: Patent. Anatomic variants: None significant. Delayed phase: No abnormal enhancement. Review of the MIP images confirms the above findings IMPRESSION: 1. Negative CTA for large vessel occlusion. 2. Approximate 60-70% atheromatous stenosis at the right carotid bifurcation. 3. Short-segment mild to moderate bilateral V4 stenoses. 4. Otherwise wide patency of the major arterial vasculature of the head and neck. 5. Layering left pleural effusion with associated atelectasis. Electronically Signed   By: Rise Mu M.D.   On: 03/07/2018 17:31      Assessment/Plan:  INTERVAL HISTORY: Dramatic  improvement in cognition in last 24 hours Principal Problem:   Altered mental status Active Problems:   Essential hypertension   Lower extremity weakness   Anemia in chronic kidney disease   Chronic kidney disease, stage III (moderate) (HCC)   Atrial fibrillation (HCC)   Hypothyroidism   DM type 2 (diabetes mellitus, type 2) (HCC)   Prosthetic hip infection (HCC)   Osteomyelitis of thoracic spine (HCC)    Spinal epidural abscess   Goals of care, counseling/discussion   Palliative care encounter    [name] is a 85 y.o. 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  He most recently has been dealing with recurrent discitis with an epidural abscess and is status post aspiration of the abscess in January with nothing growing on culture in the context of chronic suppressive Keflex.  He was on cefazolin but then readmitted and found to have progression of his disc of his vertebral infection with cord compression.  Not a candidate for neurosurgical intervention.  He had been confused and hallucinating.  While I suspect that confusion and hallucinations are due to other things that infection I did change him to an antistaphylococcal agent that penetrates the central nervous system namely nafcillin.  Since then he has had improvement in his cognition though this may just be a coincidence.  Given his improvement in his cognition and ability to interact I think it is rational and compassionate to continue the nafcillin for now  I would recommend converting it over to a continuous infusion when he gets ready to discharge.  I will plan on giving him an additional 2 weeks of nafcillin and having him seen by myself in clinic where he already has an appointment on Apr 11, 2018 at 113 110 a.m. [facility].  We can then see how he is doing and determine whether at that point in time he wishes to proceed with further aggressive therapy for example with IV cefazolin or would want to be switched over to oral Keflex.  I will put in O PAT orders but will otherwise sign off for now please call with further questions.  I am really happy that he has had a dramatic improvement in his cognition and it was a pleasure talking to him today . I spent  greater than 40 minutes with the patient including greater than 50% of time in face to face counsel of the patient his clinical course and treatment options and in coordination of his care.      LOS: 3 days   Acey Lav 03/09/2018, 12:14 PM

## 2018-03-09 NOTE — Discharge Summary (Signed)
Physician Discharge Summary  [name] OFB:[id number] DOB: 04/27/1930 DOA: 03/06/2018  PCP: Lilian Coma., MD  Admit date: 03/06/2018 Discharge date: 03/09/2018  Admitted From: SNF Disposition:  SNF  Discharge Condition:Stable CODE STATUS:DNR Diet recommendation: Heart Healthy   Brief/Interim Summary:  Patient is a 85  y.o.malewithaPMHx ofHTN, hypothyroidism, chronic a. Fib ( on xarelto), DM, CKD 3, lumbar spineosteomyelitis (on long term abx with PICC line at this time) admitted from facility on 03/06/2018 with strokelike symptoms, on presentation patient had altered mentation with left-sided droop, speech disturbance and left-sided extremity weakness, as well as bowel and urinary incontinence--- stroke work-up done including brain MRI negative. Stroke like symptoms have resolved at this time.Acute encephalopathy was likely multifactorial.  He has destructive thoracic and lumbar discitis, epidural abscess and complete compression of the cord with continued paraplegia.  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  At the meantime,  Infectious disease changed patient's antibiotic from Ancef to nafcillin on 03/07/2018.  Nafcillin to be continued until February 26,2020. He will follow-up with neurology and ID as an outpatient.  Following problems were addressed during his hospitalization:   1)Extensive Thoracic and Lumbar spine Osteomyelitis/Discitis/Epidural Abscess with destruction of T9 and cord compression----discharged to SNF 02/07/2018 with PICC line and IV Ancef, MRI of the thoracic and lumbar spine reflects ongoing/lack of improvement in patient's discitis/osteomyelitis/epidural infection from  T7-L1----discussed with ID physician Dr. Lucianne Lei dam who is familiar with patient,  infectious disease changed patient's antibiotic from Ancef to nafcillin on 03/07/2018, Neurosurgical consult appreciated, neurosurgeon states patient is not a candidate for surgery, repeat blood cultures obtained on admission on 03/06/2018 Neg to date,. Continue nafcillin until 03/23/2018.  Follow-up with ID as an outpatient on 03/21/2018 at 11:30 AM.   2)Recurrent episodes of strokelike symptoms-----stroke work-up including brain MRI negative, neurology input appreciated, neurology suggested patient may have Lewy body type dementia.  Follow-up with neurology as an outpatient.  3)Social/Ethics--- patient is essentially paraplegic from cord compression, significant challenges with mobility related activities, bedbound------ needs total care, patient with lack of improvement of the spine/epidural infections despite long-term IV antibiotics, patient has a MOST form, patient and family requesting full scope of treatment, patient and family now requesting CODE STATUS be changed to DNR, palliative care consult appreciated, overall prognosis is poor. Palliative care was consulted here.palliative care wants to follow-up him as an outpatient in a skilled nursing facility with eventual transition to hospice in the future.  4)Acute metabolic encephalopathy--- now resolved, query if secondary to underlying infection, neurology believes Lewy body type dementia is playing a role, EEG without epileptiform findings  5)DM2--- last hemoglobin 5.6 reflecting excellent control, Allow some permissive Hyperglycemia rather than risk life-threatening hypoglycemia in a patient with unreliable oral intake. Continue previous regimen on discharge.  6)Chronic Atrial Fibrillation-----c/n  Xarelto for stroke prophylaxis, last known EF 55 to 60%, rate appears controlled despite no negative chronotropic agents   Discharge Diagnoses:  Principal Problem:    Altered mental status Active Problems:   Essential hypertension   Lower extremity weakness   Anemia in chronic kidney disease   Chronic kidney disease, stage III (moderate) (HCC)   Atrial fibrillation (HCC)   Hypothyroidism   DM type 2 (diabetes mellitus, type 2) (HCC)   Prosthetic hip infection (  [place])   Osteomyelitis of thoracic spine ([place])   Spinal epidural abscess   Goals of care, counseling/discussion   Palliative care encounter    Discharge Instructions  Discharge Instructions    Ambulatory referral to Neurology   Complete by:  As directed    Follow up with Dr. Leonie Man at [facility] in 4-6 weeks for lowy body dementia. If Dr. Leonie Man has no slot, Dr. Leta Baptist or Dr. Felecia Shelling will be fine. Too complicated for RN to follow. Thanks.   Diet - low sodium heart healthy   Complete by:  As directed    Discharge instructions   Complete by:  As directed    1)Take prescribed medications as instructed. 2) follow-up with neurology and ID as an outpatient.   Home infusion instructions [facility] May follow [place] Dosing Protocol; May administer Cathflo as needed to maintain patency of vascular access device.; Flushing of vascular access device: per [facility] Protocol: 0.9% NaCl pre/post medica...   Complete by:  As directed    Instructions:  May follow [place] Dosing Protocol   Instructions:  May administer Cathflo as needed to maintain patency of vascular access device.   Instructions:  Flushing of vascular access device: per [facility] Protocol: 0.9% NaCl pre/post medication administration and prn patency; Heparin 100 u/ml, 49m for implanted ports and Heparin 10u/ml, 5110mfor all other central venous catheters.   Instructions:  May follow [facility] Anaphylaxis Protocol for First Dose Administration in the home: 0.9% NaCl at 25-50 ml/hr to maintain IV access for protocol meds. Epinephrine 0.3 ml IV/IM PRN and Benadryl 25-50 IV/IM PRN s/s of anaphylaxis.   Instructions:  [facility]  Coordinator (RN) to assist per patient IV care needs in the home PRN.   Increase activity slowly   Complete by:  As directed      Allergies as of 03/09/2018      Reactions   Ace Inhibitors Cough   Gabapentin Other (See Comments)   Unknown reaction   Nsaids Other (See Comments)   Renal insufficiency   Zolpidem Other (See Comments)   hallucinations Nightmares, irritable, nervous Disorientation      Medication List    STOP taking these medications   ceFAZolin  IVPB Commonly known as:  ANCEF     TAKE these medications   acetaminophen 500 MG tablet Commonly known as:  TYLENOL Take 500 mg by mouth 3 (three) times daily.   ASPERCREME LIDOCAINE 4 % Ptch Generic drug:  Lidocaine Apply 3 patches topically See admin instructions. Apply one patch to left arm between shoulder and elbow every morning, remove every night (12 hrs later); apply 2nd patch between shoulder blades every morning, remove every night (12 hrs later); apply 3rd patch to right arm between shoulder and elbow every morning, remover every night (12 hrs later)   BIOTENE MOISTURIZING MOUTH MT Use as directed 2 sprays in the mouth or throat every 6 (six) hours as needed (Dry mouth). Apply 2 sprays directly onto mouth and tongue. Spread thoroughly inside the mouth - every 6 hours as needed for dry mouth.   divalproex 125 MG capsule Commonly known as:  DEPAKOTE SPRINKLE Take 125 mg by mouth at bedtime.   docusate sodium 100 MG capsule Commonly known as:  COLACE Take 100 mg by mouth 2 (two) times daily.   escitalopram 10 MG tablet Commonly known as:  LEXAPRO Take 10 mg by mouth daily.   ferrous sulfate 325 (65 FE) MG tablet Take 325 mg by  mouth 2 (two) times daily with a meal.   finasteride 5 MG tablet Commonly known as:  PROSCAR Take 5 mg by mouth at bedtime.   lactulose 10 GM/15ML solution Commonly known as:  CHRONULAC Take 10-30 g by mouth See admin instructions. Take 45 mls (30 g) by mouth every morning,  take 15 mls (10 g) at bedtime. Hold for loose stool.   levothyroxine 88 MCG tablet Commonly known as:  SYNTHROID, LEVOTHROID Take 88 mcg by mouth daily before breakfast.   lovastatin 40 MG tablet Commonly known as:  MEVACOR Take 40 mg by mouth at bedtime.   nafcillin  IVPB Inject 12 g into the vein daily for 14 days. As a continuous infusion. Indication:  MSSA discitis Last Day of Therapy:  03/23/2018 Labs - Once weekly:  CBC/D and BMP, Labs - Every other week:  ESR and CRP   nystatin powder Generic drug:  nystatin Apply topically See admin instructions. Apply powder to skin fold rash twice daily until resolved   pioglitazone 15 MG tablet Commonly known as:  ACTOS Take 15 mg by mouth daily.   pregabalin 75 MG capsule Commonly known as:  LYRICA Take 1 capsule (75 mg total) by mouth daily. What changed:  when to take this   rivaroxaban 20 MG Tabs tablet Commonly known as:  XARELTO Take 20 mg by mouth daily.   rOPINIRole 0.5 MG tablet Commonly known as:  REQUIP Take 0.5 mg by mouth at bedtime.   telmisartan 40 MG tablet Commonly known as:  MICARDIS Take 40 mg by mouth daily.   terazosin 5 MG capsule Commonly known as:  HYTRIN Take 5 mg by mouth at bedtime.   tiZANidine 2 MG tablet Commonly known as:  ZANAFLEX Take 2 mg by mouth 3 (three) times daily.   torsemide 10 MG tablet Commonly known as:  DEMADEX Take 10 mg by mouth daily.   vitamin C 500 MG tablet Commonly known as:  ASCORBIC ACID Take 500 mg by mouth 2 (two) times daily.   WELLBUTRIN SR 100 MG 12 hr tablet Generic drug:  buPROPion Take 100 mg by mouth 2 (two) times daily.            Home Infusion Instuctions  (From admission, onward)         Start     Ordered   03/09/18 0000  Home infusion instructions [facility] May follow [place] Dosing Protocol; May administer Cathflo as needed to maintain patency of vascular access device.; Flushing of vascular access device: per [facility]  Protocol: 0.9% NaCl pre/post medica...    Question Answer Comment  Instructions May follow [place] Dosing Protocol   Instructions May administer Cathflo as needed to maintain patency of vascular access device.   Instructions Flushing of vascular access device: per [facility] Protocol: 0.9% NaCl pre/post medication administration and prn patency; Heparin 100 u/ml, 53m for implanted ports and Heparin 10u/ml, 536mfor all other central venous catheters.   Instructions May follow [facility] Anaphylaxis Protocol for First Dose Administration in the home: 0.9% NaCl at 25-50 ml/hr to maintain IV access for protocol meds. Epinephrine 0.3 ml IV/IM PRN and Benadryl 25-50 IV/IM PRN s/s of anaphylaxis.   Instructions [facility] Infusion Coordinator (RN) to assist per patient IV care needs in the home PRN.      03/09/18 1341         Follow-up Information    SeGarvin FilaMD. Schedule an appointment as soon as possible for  a visit in 4 week(s).   Specialties:  Neurology, Radiology Contact information: [address]  [place] [phone]          Allergies  Allergen Reactions  . Ace Inhibitors Cough  . Gabapentin Other (See Comments)    Unknown reaction  . Nsaids Other (See Comments)    Renal insufficiency   . Zolpidem Other (See Comments)    hallucinations Nightmares, irritable, nervous Disorientation    Consultations:  ID,Neurology   Procedures/Studies: Ct Angio Head W Or Wo Contrast  Result Date: 03/07/2018 CLINICAL DATA:  Initial evaluation for stroke. EXAM: CT ANGIOGRAPHY HEAD AND NECK TECHNIQUE: Multidetector CT imaging of the head and neck was performed using the standard protocol during bolus administration of intravenous contrast. Multiplanar CT image reconstructions and MIPs were obtained to evaluate the vascular anatomy. Carotid stenosis measurements (when applicable) are obtained utilizing NASCET criteria, using the distal internal carotid  diameter as the denominator. CONTRAST:  55m ISOVUE-370 IOPAMIDOL (ISOVUE-370) INJECTION 76%, <See Chart> ISOVUE-370 IOPAMIDOL (ISOVUE-370) INJECTION 76% COMPARISON:  Prior CT and MRI from 03/06/2018 FINDINGS: CT HEAD FINDINGS Brain: Advanced cerebral atrophy with chronic microvascular ischemic disease again noted, stable. No acute intracranial hemorrhage. No acute large vessel territory infarct. No mass lesion, midline shift or mass effect. Stable ventricular prominence related to global parenchymal volume loss without hydrocephalus. No extra-axial fluid collection. Vascular: Calcified atherosclerosis present at the skull base. Skull: Calvarium and scalp soft tissues demonstrate no acute finding. Sinuses: Chronic mucoperiosteal thickening present within the ethmoidal air cells and maxillary sinuses. No mastoid effusion. Orbits: Patient status post bilateral ocular lens replacement. Review of the MIP images confirms the above findings CTA NECK FINDINGS Aortic arch: Visualized aortic arch of normal caliber with normal 3 vessel morphology. Moderate atheromatous plaque about the aortic arch with no hemodynamically significant stenosis about the origin of the great vessels. Visualized subclavian arteries widely patent. Right carotid system: Right CCA tortuous proximally but widely patent to the bifurcation without significant stenosis. Evaluation the right carotid bifurcation limited by motion artifact. Bulky calcified atheromatous plaque at the right bifurcation with up to approximately 60-70% stenosis by NASCET criteria. Right ICA mildly tortuous but otherwise widely patent distally to the skull base. Left carotid system: Left CCA tortuous proximally. Mixed plaque at the distal left CCA/bifurcation with associated stenosis of up to 25% by NASCET criteria. Left ICA mildly tortuous but otherwise widely patent to the skull base without additional stenosis. Vertebral arteries: Both of the vertebral arteries arise from the  subclavian arteries. Vertebral arteries widely patent within the neck without stenosis, dissection, or occlusion. Skeleton: No acute osseous abnormality. No discrete lytic or blastic osseous lesions. Bulky bridging osteophytic spurring noted throughout the cervicothoracic spine, suggesting DISH. OPLL present at C2-3. Prominent degenerative thickening at the tectorial membrane. Other neck: No other soft tissue abnormality within the neck. Upper chest: Right subclavian approach central venous catheter partially visualized. Layering left pleural effusion with associated atelectasis partially visualized as well. Remainder the visualized upper chest demonstrates no other acute finding. Review of the MIP images confirms the above findings CTA HEAD FINDINGS Anterior circulation: Petrous segments widely patent bilaterally. Smooth diffuse atheromatous plaque within the cavernous/supraclinoid ICAs with mild diffuse narrowing. ICA termini widely patent bilaterally. A1 segments widely patent. Normal anterior communicating artery. Anterior cerebral arteries widely patent to their distal aspects without stenosis. Right M1 widely patent. Normal right MCA bifurcation. Distal right MCA branches well perfused and widely patent. Mild atheromatous irregularity with resultant mild diffuse  narrowing of the mid and distal left M1 segment noted. Normal left MCA bifurcation. Distal left MCA branches well perfused and fairly symmetric with the right. Posterior circulation: Calcified plaque within the V4 segments bilaterally with resultant mild stenosis. Vertebral arteries otherwise patent to the vertebrobasilar junction. Posterior inferior cerebral arteries patent proximally. Mild atheromatous irregularity within the basilar artery without significant stenosis. Superior cerebral arteries patent bilaterally. Both of the posterior cerebral arteries primarily supplied via the basilar and are widely patent to their distal aspects. Venous  sinuses: Patent. Anatomic variants: None significant. Delayed phase: No abnormal enhancement. Review of the MIP images confirms the above findings IMPRESSION: 1. Negative CTA for large vessel occlusion. 2. Approximate 60-70% atheromatous stenosis at the right carotid bifurcation. 3. Short-segment mild to moderate bilateral V4 stenoses. 4. Otherwise wide patency of the major arterial vasculature of the head and neck. 5. Layering left pleural effusion with associated atelectasis. Electronically Signed   By: Jeannine Boga M.D.   On: 03/07/2018 17:31   Dg Chest 2 View  Result Date: 02/16/2018 CLINICAL DATA:  Confusion x4 months. EXAM: CHEST - 2 VIEW COMPARISON:  02/07/2018 FINDINGS: Stable cardiomegaly with aortic atherosclerosis. Small left posterior pleural effusion with bibasilar atelectasis. PICC line tip terminates at the cavoatrial juncture. No overt pulmonary edema. Degenerative change about the included right AC and glenohumeral joints as well as along the thoracic spine. IMPRESSION: 1. Stable cardiomegaly with aortic atherosclerosis. 2. Small left posterior pleural effusion with bibasilar atelectasis. 3. PICC line tip at the cavoatrial junction. Electronically Signed   By: Ashley Royalty M.D.   On: 02/16/2018 17:42   Ct Head Wo Contrast  Result Date: 02/16/2018 CLINICAL DATA:  Low-grade fever currently being treated for epidural abscess. Encephalopathy. EXAM: CT HEAD WITHOUT CONTRAST TECHNIQUE: Contiguous axial images were obtained from the base of the skull through the vertex without intravenous contrast. COMPARISON:  01/30/2017 head CT FINDINGS: Brain: Redemonstration of sulcal and ventricular prominence consistent with superficial and central atrophy. Chronic mild-to-moderate small vessel ischemia of periventricular and deep white matter. No large vascular territory infarction, hemorrhage or midline shift. Midline fourth ventricle and basal cisterns without effacement allowing for patient head  tilt. No intra-axial mass nor extra-axial fluid. Vascular: Atherosclerosis at the skull base involving the basilar and both distal vertebral arteries and carotid siphons. Skull: Intact Sinuses/Orbits: Scattered ethmoid sinus mucosal thickening. Bilateral cataract extractions. Other: None IMPRESSION: Atrophy with chronic small vessel ischemic disease. No acute intracranial abnormality. Electronically Signed   By: Ashley Royalty M.D.   On: 02/16/2018 18:23   Ct Angio Neck W Or Wo Contrast  Result Date: 03/07/2018 CLINICAL DATA:  Initial evaluation for stroke. EXAM: CT ANGIOGRAPHY HEAD AND NECK TECHNIQUE: Multidetector CT imaging of the head and neck was performed using the standard protocol during bolus administration of intravenous contrast. Multiplanar CT image reconstructions and MIPs were obtained to evaluate the vascular anatomy. Carotid stenosis measurements (when applicable) are obtained utilizing NASCET criteria, using the distal internal carotid diameter as the denominator. CONTRAST:  10m ISOVUE-370 IOPAMIDOL (ISOVUE-370) INJECTION 76%, <See Chart> ISOVUE-370 IOPAMIDOL (ISOVUE-370) INJECTION 76% COMPARISON:  Prior CT and MRI from 03/06/2018 FINDINGS: CT HEAD FINDINGS Brain: Advanced cerebral atrophy with chronic microvascular ischemic disease again noted, stable. No acute intracranial hemorrhage. No acute large vessel territory infarct. No mass lesion, midline shift or mass effect. Stable ventricular prominence related to global parenchymal volume loss without hydrocephalus. No extra-axial fluid collection. Vascular: Calcified atherosclerosis present at the skull base. Skull: Calvarium and scalp soft  tissues demonstrate no acute finding. Sinuses: Chronic mucoperiosteal thickening present within the ethmoidal air cells and maxillary sinuses. No mastoid effusion. Orbits: Patient status post bilateral ocular lens replacement. Review of the MIP images confirms the above findings CTA NECK FINDINGS Aortic arch:  Visualized aortic arch of normal caliber with normal 3 vessel morphology. Moderate atheromatous plaque about the aortic arch with no hemodynamically significant stenosis about the origin of the great vessels. Visualized subclavian arteries widely patent. Right carotid system: Right CCA tortuous proximally but widely patent to the bifurcation without significant stenosis. Evaluation the right carotid bifurcation limited by motion artifact. Bulky calcified atheromatous plaque at the right bifurcation with up to approximately 60-70% stenosis by NASCET criteria. Right ICA mildly tortuous but otherwise widely patent distally to the skull base. Left carotid system: Left CCA tortuous proximally. Mixed plaque at the distal left CCA/bifurcation with associated stenosis of up to 25% by NASCET criteria. Left ICA mildly tortuous but otherwise widely patent to the skull base without additional stenosis. Vertebral arteries: Both of the vertebral arteries arise from the subclavian arteries. Vertebral arteries widely patent within the neck without stenosis, dissection, or occlusion. Skeleton: No acute osseous abnormality. No discrete lytic or blastic osseous lesions. Bulky bridging osteophytic spurring noted throughout the cervicothoracic spine, suggesting DISH. OPLL present at C2-3. Prominent degenerative thickening at the tectorial membrane. Other neck: No other soft tissue abnormality within the neck. Upper chest: Right subclavian approach central venous catheter partially visualized. Layering left pleural effusion with associated atelectasis partially visualized as well. Remainder the visualized upper chest demonstrates no other acute finding. Review of the MIP images confirms the above findings CTA HEAD FINDINGS Anterior circulation: Petrous segments widely patent bilaterally. Smooth diffuse atheromatous plaque within the cavernous/supraclinoid ICAs with mild diffuse narrowing. ICA termini widely patent bilaterally. A1  segments widely patent. Normal anterior communicating artery. Anterior cerebral arteries widely patent to their distal aspects without stenosis. Right M1 widely patent. Normal right MCA bifurcation. Distal right MCA branches well perfused and widely patent. Mild atheromatous irregularity with resultant mild diffuse narrowing of the mid and distal left M1 segment noted. Normal left MCA bifurcation. Distal left MCA branches well perfused and fairly symmetric with the right. Posterior circulation: Calcified plaque within the V4 segments bilaterally with resultant mild stenosis. Vertebral arteries otherwise patent to the vertebrobasilar junction. Posterior inferior cerebral arteries patent proximally. Mild atheromatous irregularity within the basilar artery without significant stenosis. Superior cerebral arteries patent bilaterally. Both of the posterior cerebral arteries primarily supplied via the basilar and are widely patent to their distal aspects. Venous sinuses: Patent. Anatomic variants: None significant. Delayed phase: No abnormal enhancement. Review of the MIP images confirms the above findings IMPRESSION: 1. Negative CTA for large vessel occlusion. 2. Approximate 60-70% atheromatous stenosis at the right carotid bifurcation. 3. Short-segment mild to moderate bilateral V4 stenoses. 4. Otherwise wide patency of the major arterial vasculature of the head and neck. 5. Layering left pleural effusion with associated atelectasis. Electronically Signed   By: Jeannine Boga M.D.   On: 03/07/2018 17:31   Mr Brain Wo Contrast  Result Date: 03/06/2018 CLINICAL DATA:  85 year old male code stroke presentation with left side facial droop weakness and slurred speech. EXAM: MRI HEAD WITHOUT CONTRAST TECHNIQUE: Multiplanar, multiecho pulse sequences of the brain and surrounding structures were obtained without intravenous contrast. COMPARISON:  Head CT earlier today. Brain MRI 01/31/2018 and earlier. FINDINGS: Brain:  Study is intermittently degraded by motion artifact despite repeated imaging attempts. No restricted diffusion to suggest  acute infarction. No midline shift, mass effect, evidence of mass lesion, extra-axial collection or acute intracranial hemorrhage. Cervicomedullary junction and pituitary are within normal limits. Stable cerebral volume and ex vacuo appearing ventricular enlargement. Scattered cerebral white matter T2 and FLAIR hyperintensity appears stable and is most pronounced in the external capsules. No new signal abnormality. No chronic cerebral blood products are evident Vascular: Major intracranial vascular flow voids are stable with mild to moderate generalized intracranial artery ectasia. Skull and upper cervical spine: Negative visible cervical spine. Normal bone marrow signal. Sinuses/Orbits: Stable, negative. Other: Stable trace mastoid fluid. Negative scalp and face soft tissues. IMPRESSION: 1.  No acute intracranial abnormality. 2. Stable MRI appearance of the brain since 2019. Electronically Signed   By: Genevie Ann M.D.   On: 03/06/2018 15:29   Mr Thoracic Spine W Wo Contrast  Addendum Date: 03/06/2018   ADDENDUM REPORT: 03/06/2018 16:41 ADDENDUM: Study discussed by telephone with Dr. Vivi Martens in the ED on 03/06/2018 at 1635 hours. Electronically Signed   By: Genevie Ann M.D.   On: 03/06/2018 16:41   Result Date: 03/06/2018 CLINICAL DATA:  85 year old male with back pain, possible cauda equina syndrome. History of discitis osteomyelitis and phlegmon in the lower thoracic spine last month T7 through T10. Possible early discitis osteomyelitis at L4-L5 and L5-S1 at that time. 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 EXAM: MRI THORACIC WITHOUT AND WITH CONTRAST TECHNIQUE: Multiplanar and multiecho pulse sequences of the thoracic spine were obtained without and with intravenous contrast. CONTRAST:  10 milliliters Gadavist in conjunction with contrast enhanced imaging of the lumbar spine reported  separately. COMPARISON:  Lumbar spine MRI today reported separately. Thoracic MRI 01/31/2018. FINDINGS: Limited cervical spine imaging: Stable since January. Intermittent cervical spine ankylosis. Thoracic spine segmentation: Normal, concordant with the numbering in January and on the lumbar MRI today. Alignment: Stable since January. Straightening of lower thoracic kyphosis. Vertebrae: Regressed but not resolved marrow edema in the T8 vertebral body. Stable to slightly regressed marrow edema in T9 and T10. No new levels of edema. Cord: Abnormal spinal cord signal beginning at the T8 level and continuing caudally with increased cord T2 and STIR signal (series 19, image 9). This continues into the lower thoracic cord and conus and is more apparent than on the lumbar images today. No abnormal cord enhancement is identified. But there is abnormal dural thickening and enhancement from T7-T8 through L1 which has not improved since January. There is multifactorial spinal cord compression at T9-T10 (series 27, image 26 and series 22, image 26). Above T8 spinal cord, dura and the epidural space appear within normal limits. Paraspinal and other soft tissues: Stable visible chest and upper abdominal viscera. Enhancing paraspinal phlegmon with epicenter at T9-T10 has not significantly changed since January (series 24, image 26). There is paraspinal soft tissue edema, but no drainable paraspinal fluid collection. The epidural space there is further described below. Disc levels: C7-T1: Stable increased STIR signal in the disc space without evidence of active inflammation. T1-T2 through T6-T7: Stable, T4-T5 chronic ankylosis. T7-T8: Dural thickening and enhancement. No spinal or foraminal stenosis. T8-T9: Paraspinal phlegmon. Abnormal signal in the disc. Dural thickening and enhancement. Superimposed posterior element hypertrophy. No epidural fluid collection. Spinal stenosis with mild spinal cord mass effect. Mild to moderate  left T8 foraminal stenosis. T9-T10: Partially eroded T9 and T10 endplates with bulky endplate and paraspinal phlegmon and fluid in the disc space. Epidural space inflammation, dural thickening and enhancement. No epidural fluid collection is evident.  Spinal stenosis with moderate spinal cord mass effect (series 22, image 26), not significantly changed since January. Severe bilateral T9 foraminal stenosis. T10-T11: Faint abnormal signal in the disc space. Dural thickening and enhancement. Epidural inflammation. No epidural fluid. Mild spinal stenosis. Mild to moderate bilateral T10 foraminal stenosis. T11-T12: Degenerated but otherwise negative disc space. Mild dural thickening and enhancement. No marrow edema at this level. Mild degenerative spinal stenosis and mild to moderate left greater than right T11 foraminal stenosis. T12-L1: Ankylosis with chronic mild spinal stenosis. Mild dural thickening and enhancement here might be chronic. Chronic cord myelomalacia here, and moderate to severe bilateral T12 foraminal stenosis. IMPRESSION: 1. Spinal infection T7-T8 through T11-T12 has not significantly changed since January. Associated spinal cord involvement (see #2). Discitis osteomyelitis at T9-T10, and probably also T8-T9. Dural thickening and enhancement at the other levels. Bulky paraspinal phlegmon epicenter at T9-T10, but no drainable fluid other than that in the T9-T10 disc space. 2. Spinal stenosis throughout those levels with moderate Cord Compression at T9-T10. Spinal Cord Edema from T8 inferiorly. Superimposed chronic cord myelomalacia at T12-L1. 3. No new levels of infection. 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 Electronically Signed: By: Genevie Ann M.D. On: 03/06/2018 16:31   Mr Lumbar Spine W Wo Contrast  Result Date: 03/06/2018 CLINICAL DATA:  85 year old male with back pain, possible cauda equina syndrome. History of discitis osteomyelitis and phlegmon in the lower thoracic spine last month T7  through T10. Possible early discitis osteomyelitis at L4-L5 and L5-S1 at that time. 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 EXAM: MRI LUMBAR SPINE WITHOUT AND WITH CONTRAST TECHNIQUE: Multiplanar and multiecho pulse sequences of the lumbar spine were obtained without and with intravenous contrast. CONTRAST:  10 milliliters Gadavist in conjunction with contrast enhanced imaging of the thoracic reported separately. COMPARISON:  Thoracic MRI today reported separately. Lumbar MRI 01/31/2018. FINDINGS: Segmentation: Same numbering system as used in January which designates a vestigial S1-S2 disc space. Alignment:  Stable since January.  No lumbar spondylolisthesis. Vertebrae: The mild marrow edema at the L5-S1 level in January has resolved. There is no evidence of lumbar or upper sacral discitis osteomyelitis. No acute osseous abnormality identified. Conus medullaris and cauda equina: chronic myelomalacia in the lower thoracic spinal cord at T12-L1 appears stable. The conus is at L1-L2 and stable. Cauda equina nerve roots are stable. No abnormal intradural enhancement. No lumbar dural thickening. Paraspinal and other soft tissues: Distended urinary bladder similar to the prior study. Negative visible abdominal viscera. Diffuse paraspinal muscle atrophy is stable, relatively sparing the left psoas muscle. Chronic postoperative changes posteriorly at T12-L1, L3-L4 through the sacrum. Disc levels: Stable since January. T12-L1 ankylosis with posterior disc osteophyte complex with no high-grade spinal stenosis but chronic spinal cord myelomalacia at that level. Ankylosed L2 through L5 vertebrae. Trace increased STIR signal in the L5-S1 disc and posterior elements appears degenerative. Severe multifactorial spinal stenosis at L5-S1 (series 14, image 27), due to bulky disc osteophyte complex and posterior element hypertrophy. Severe left L5 foraminal stenosis. IMPRESSION: 1. No acute or inflammatory process in the lumbar  spine. The mild marrow edema at L5-S1 in January has resolved. 2. Severe chronic multifactorial spinal and left foraminal stenosis at L5-S1. 3. No acute osseous abnormality. Chronic ankylosed T12-L1 and L2 through L5 levels. 4. Chronic lower thoracic spinal cord myelomalacia at T12-L1. 5. Distended urinary bladder. Electronically Signed   By: Genevie Ann M.D.   On: 03/06/2018 16:15   Dg Chest Portable 1 View  Result Date:  03/06/2018 CLINICAL DATA:  Possible stroke.  Found down. EXAM: PORTABLE CHEST 1 VIEW COMPARISON:  02/16/2018 FINDINGS: The heart is enlarged but stable. There is tortuosity and calcification of the thoracic aorta. The right PICC line is stable. Low lung volumes with vascular crowding and streaky basilar atelectasis. No definite pleural effusions. The bony thorax is intact. IMPRESSION: Cardiac enlargement. Low lung volumes with streaky basilar atelectasis but no definite infiltrates or large effusions. Electronically Signed   By: Marijo Sanes M.D.   On: 03/06/2018 10:30   Ct Head Code Stroke Wo Contrast  Result Date: 03/06/2018 CLINICAL DATA:  Code stroke. Left-sided facial droop, weakness, and slurred speech EXAM: CT HEAD WITHOUT CONTRAST TECHNIQUE: Contiguous axial images were obtained from the base of the skull through the vertex without intravenous contrast. COMPARISON:  02/16/2018 FINDINGS: Brain: No evidence of acute infarction, hemorrhage, hydrocephalus, extra-axial collection or mass lesion/mass effect. Remote right corona radiata infarct. Advanced atrophy with ventriculomegaly Vascular: No hyperdense vessel. Extensive atherosclerotic calcification. Skull: No acute finding Sinuses/Orbits: Bilateral cataract resection Other: These results were communicated to Dr. Cheral Marker at 10:04 amon 2/9/2020by text page via the [facility] messaging system. ASPECTS [facility] Stroke Program Early CT Score) - Ganglionic level infarction (caudate, lentiform nuclei, internal capsule, insula, M1-M3 cortex): 7 -  Supraganglionic infarction (M4-M6 cortex): 3 Total score (0-10 with 10 being normal): 10 IMPRESSION: 1. No acute finding or change from prior. 2. Advanced atrophy. Electronically Signed   By: Monte Fantasia M.D.   On: 03/06/2018 10:05       Subjective: Patient seen and examined the bedside this afternoon.  Hemodynamically stable.  Remains comfortable.  Alert and oriented.  Stable for discharge.  Discharge Exam: Vitals:   03/09/18 0844 03/09/18 1114  BP: 135/85 119/64  Pulse: 74 67  Resp: 16 18  Temp: 98.4 F (36.9 C) 98.4 F (36.9 C)  SpO2: 98% 99%   Vitals:   03/08/18 2309 03/09/18 0259 03/09/18 0844 03/09/18 1114  BP: 118/66 133/79 135/85 119/64  Pulse: 80 71 74 67  Resp: 15 (!) _0 Temp: 98.7 F (37.1 C) 98.8 F (37.1 C) 98.4 F (36.9 C) 98.4 F (36.9 C)  TempSrc: Oral Oral Axillary Oral  SpO2: 97% 97% 98% 99%  Weight:      Height:        General: Pt is alert, awake, not in acute distress Cardiovascular: RRR, S1/S2 +, no rubs, no gallops Respiratory: CTA bilaterally, no wheezing, no rhonchi Abdominal: Soft, NT, ND, bowel sounds + Extremities: no edema, no cyanosis,paraplegia    The results of significant diagnostics from this hospitalization (including imaging, microbiology, ancillary and laboratory) are listed below for reference.     Microbiology: Recent Results (from the past 240 hour(s))  Blood culture (routine x 2)     Status: None (Preliminary result)   Collection Time: 03/06/18 10:32 AM  Result Value Ref Range Status   Specimen Description BLOOD LEFT HAND  Final   Special Requests   Final    BOTTLES DRAWN AEROBIC AND ANAEROBIC Blood Culture adequate volume   Culture   Final    NO GROWTH 3 DAYS Performed at [facility], [address], [place], [place] [address]    Report Status PENDING  Incomplete  Urine culture     Status: Abnormal   Collection Time: 03/06/18 10:40 AM  Result Value Ref Range Status   Specimen Description URINE,  RANDOM  Final   Special Requests NONE  Final   Culture (  A)  Final    <10,000 COLONIES/mL INSIGNIFICANT GROWTH Performed at [facility] [address], [place], [place] [address]    Report Status 03/07/2018 FINAL  Final  Blood culture (routine x 2)     Status: None (Preliminary result)   Collection Time: 03/06/18 10:57 AM  Result Value Ref Range Status   Specimen Description BLOOD LEFT ANTECUBITAL  Final   Special Requests   Final    BOTTLES DRAWN AEROBIC AND ANAEROBIC Blood Culture adequate volume   Culture   Final    NO GROWTH 3 DAYS Performed at [facility], [place] [address], [place], [place] [address]    Report Status PENDING  Incomplete  MRSA PCR Screening     Status: None   Collection Time: 03/07/18  7:21 AM  Result Value Ref Range Status   MRSA by PCR NEGATIVE NEGATIVE Final    Comment:        The GeneXpert MRSA Assay (FDA approved for NASAL specimens only), is one component of a comprehensive MRSA colonization surveillance program. It is not intended to diagnose MRSA infection nor to guide or monitor treatment for MRSA infections. Performed at [facility], [place] [address] [place], [place] [address]      Labs: BNP (last 3 results) Recent Labs    01/30/18 1620  BNP 678.9*   Basic Metabolic Panel: Recent Labs  Lab 03/06/18 0952 03/06/18 0953 03/07/18 0443  NA  --  142 144  K  --  4.1 4.0  CL  --  106 109  CO2  --  27 27  GLUCOSE  --  91 75  BUN  --  9 11  CREATININE 0.90 1.00 1.12  CALCIUM  --  8.9 8.7*   Liver Function Tests: Recent Labs  Lab 03/06/18 0953  AST 22  ALT 6  ALKPHOS 66  BILITOT 0.5  PROT 6.3*  ALBUMIN 2.9*   No results for input(s): LIPASE, AMYLASE in the last 168 hours. No results for input(s): AMMONIA in the last 168 hours. CBC: Recent Labs  Lab 03/06/18 0953 03/07/18 0443  WBC 5.8 5.4  NEUTROABS 3.8  --   HGB 9.8* 11.0*  HCT 32.7* 36.5*  MCV 100.0 100.3*  PLT 129* 126*   Cardiac Enzymes: No  results for input(s): CKTOTAL, CKMB, CKMBINDEX, TROPONINI in the last 168 hours. BNP: Invalid input(s): POCBNP CBG: Recent Labs  Lab 03/08/18 1221 03/08/18 1705 03/08/18 2141 03/09/18 0841 03/09/18 1117  GLUCAP 84 97 71 82 120*   D-Dimer No results for input(s): DDIMER in the last 72 hours. Hgb A1c No results for input(s): HGBA1C in the last 72 hours. Lipid Profile No results for input(s): CHOL, HDL, LDLCALC, TRIG, CHOLHDL, LDLDIRECT in the last 72 hours. Thyroid function studies Recent Labs    03/06/18 1721  TSH 2.294   Anemia work up No results for input(s): VITAMINB12, FOLATE, FERRITIN, TIBC, IRON, RETICCTPCT in the last 72 hours. Urinalysis    Component Value Date/Time   COLORURINE YELLOW 03/06/2018 1040   APPEARANCEUR CLEAR 03/06/2018 1040   LABSPEC 1.014 03/06/2018 1040   PHURINE 7.0 03/06/2018 1040   GLUCOSEU NEGATIVE 03/06/2018 1040   HGBUR NEGATIVE 03/06/2018 1040   BILIRUBINUR NEGATIVE 03/06/2018 1040   [place] 03/06/2018 1040   PROTEINUR NEGATIVE 03/06/2018 1040   UROBILINOGEN 0.2 12/30/2012 2015   NITRITE NEGATIVE 03/06/2018 1040   [place] 03/06/2018 1040   Sepsis Labs Invalid input(s): PROCALCITONIN,  WBC,  LACTICIDVEN Microbiology  Recent Results (from the past 240 hour(s))  Blood culture (routine x 2)     Status: None (Preliminary result)   Collection Time: 03/06/18 10:32 AM  Result Value Ref Range Status   Specimen Description BLOOD LEFT HAND  Final   Special Requests   Final    BOTTLES DRAWN AEROBIC AND ANAEROBIC Blood Culture adequate volume   Culture   Final    NO GROWTH 3 DAYS Performed at [facility], [address] [place], [place] [address]    Report Status PENDING  Incomplete  Urine culture     Status: Abnormal   Collection Time: 03/06/18 10:40 AM  Result Value Ref Range Status   Specimen Description URINE, RANDOM  Final   Special Requests NONE  Final   Culture (A)  Final    <10,000 COLONIES/mL  INSIGNIFICANT GROWTH Performed at [facility] [address] [place], [place] [address]    Report Status 03/07/2018 FINAL  Final  Blood culture (routine x 2)     Status: None (Preliminary result)   Collection Time: 03/06/18 10:57 AM  Result Value Ref Range Status   Specimen Description BLOOD LEFT ANTECUBITAL  Final   Special Requests   Final    BOTTLES DRAWN AEROBIC AND ANAEROBIC Blood Culture adequate volume   Culture   Final    NO GROWTH 3 DAYS Performed at [facility], [place] [address], [place], [place] [address]    Report Status PENDING  Incomplete  MRSA PCR Screening     Status: None   Collection Time: 03/07/18  7:21 AM  Result Value Ref Range Status   MRSA by PCR NEGATIVE NEGATIVE Final    Comment:        The GeneXpert MRSA Assay (FDA approved for NASAL specimens only), is one component of a comprehensive MRSA colonization surveillance program. It is not intended to diagnose MRSA infection nor to guide or monitor treatment for MRSA infections. Performed at [facility], [place] [address], [place],  [address]     Please note: You were cared for by a hospitalist during your hospital stay. Once you are discharged, your primary care physician will handle any further medical issues. Please note that NO REFILLS for any discharge medications will be authorized once you are discharged, as it is imperative that you return to your primary care physician (or establish a relationship with a primary care physician if you do not have one) for your post hospital discharge needs so that they can reassess your need for medications and monitor your lab values.    Time coordinating discharge: 40 minutes  SIGNED:   Shelly Coss, MD  [facility] 03/09/2018, 1:41 PM Pager [phone]  If 7PM-7AM, please contact night-coverage [email or web address] Password [email or web address]

## 2018-03-09 NOTE — Social Work (Signed)
Clinical Social Worker facilitated patient discharge including contacting patient family and facility to confirm patient discharge plans.  Clinical information faxed to facility and family agreeable with plan.  CSW arranged ambulance transport via [facility] to [facility] RN to call [phone]  with report prior to discharge.  Clinical Social Worker will sign off for now as social work intervention is no longer needed. Please consult us again if new need arises.  Arlyce Circle, MSW, LCSWA Clinical Social Worker [phone]  

## 2018-03-09 NOTE — Clinical Social Work Note (Signed)
Clinical Social Work Assessment  Patient Details  Name: [name] MRN: [id number] Date of Birth: 04/06/1930  Date of referral:  03/09/18               Reason for consult:  Discharge Planning                Permission sought to share information with:  Facility Medical sales representative, Family Supports Permission granted to share information::  Yes, Verbal Permission Granted  Name::     [name] Knapik  Agency::  [facility]  Relationship::  wife  Contact Information:  [phone]  Housing/Transportation Living arrangements for the past 2 months:  Skilled Nursing Facility Source of Information:  Patient Patient Interpreter Needed:  None Criminal Activity/Legal Involvement Pertinent to Current Situation/Hospitalization:  No - Comment as needed Significant Relationships:  Adult Children, Spouse, Other Family Members Lives with:  Self Do you feel safe going back to the place where you live?  Yes Need for family participation in patient care:  Yes (Comment)  Care giving concerns:  Pt LTC resident at [facility], GOC conversation was had with family. Plan to return to SNF with palliative care.   Social Worker assessment / plan:  Pt did not rouse when CSW entered or called his name. CSW spoke with pt wife on telephone. Introduced self, role, and reason for call. Pt wife in agreement with return to [facility] ("that's a permanent thing.") She states that she feels okay with him returning and is in agreement with palliative services being provided at [facility] as well. She will meet pt at facility this evening.  Employment status:  Retired Database administrator, [employer] [place] PT Recommendations:  Not assessed at this time Information / Referral to community resources:  Skilled Nursing Facility  Patient/Family's Response to care:  Pt wife amenable to speaking with CSW, in agreement with return to SNF.   Patient/Family's Understanding of and Emotional Response  to Diagnosis, Current Treatment, and Prognosis:  Pt wife states understanding of diagnosis, current treatment and prognosis. Pt wife sounds happy with care at SNF and here at hospital.   Emotional Assessment Appearance:  Appears stated age Attitude/Demeanor/Rapport:  Unable to Assess Affect (typically observed):  Unable to Assess Orientation:  Oriented to Self, Oriented to Place, Oriented to  Time, Oriented to Situation(lethargic) Alcohol / Substance use:  Not Applicable Psych involvement (Current and /or in the community):  No (Comment)  Discharge Needs  Concerns to be addressed:  Care Coordination Readmission within the last 30 days:    Current discharge risk:  Cognitively Impaired, Physical Impairment, Chronically ill Barriers to Discharge:  Barriers Resolved   Doy Hutching, LCSWA 03/09/2018, 3:11 PM

## 2018-03-09 NOTE — Progress Notes (Signed)
PHARMACY CONSULT NOTE FOR:  OUTPATIENT  PARENTERAL ANTIBIOTIC THERAPY (OPAT)  Indication: MSSA discitis with cord compression Regimen: Nafcillin 12/day via continuous infusion  End date: 03/23/2018  IV antibiotic discharge orders are pended. To discharging provider:  please sign these orders via discharge navigator,  Select New Orders & click on the button choice - Manage This Unsigned Work.     Thank you for allowing pharmacy to be a part of this patient's care.  Sharin Mons, PharmD, BCPS, BCIDP Infectious Diseases Clinical Pharmacist Phone: [phone] 03/09/2018, 1:06 PM

## 2018-03-09 NOTE — [place] FL2 (Addendum)
[place] MEDICAID FL2 LEVEL OF CARE SCREENING TOOL     IDENTIFICATION  Patient Name: [name] Birthdate: [birth year] Sex: male Admission Date (Current Location): 03/06/2018  [place]  [place] [id number] Facility and Address:  [facility], [address], [place], [place] [address]      Provider Number: [id number]  Attending Physician Name and Address:  Burnadette PopAdhikari, Amrit, MD  Relative Name and Phone Number:  Jalene MulletGaerlene Callicut; wife; [phone]    Current Level of Care: Hospital Recommended Level of Care: Skilled Nursing Facility Prior Approval Number:    Date Approved/Denied:   PASRR Number: [id number]  Discharge Plan: SNF    Current Diagnoses: Patient Active Problem List   Diagnosis Date Noted  . Goals of care, counseling/discussion   . Palliative care encounter   . Spinal epidural abscess 03/06/2018  . Pressure injury of skin 02/05/2018  . 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 Hypotension 01/30/2018  . Syncope 01/30/2018  . Facial droop 11/04/2017  . Left arm weakness 11/04/2017  . Symptomatic bradycardia 11/03/2017  . Bradycardia   . Altered mental status   . Bilateral leg weakness 06/27/2017  . Depression 12/07/2016  . Alzheimer's type dementia (HCC) 12/07/2016  . Anxiety 01/11/2016  . Other chronic pain 06/17/2015  . Grief 06/17/2015  . Major depression 06/17/2015  . Type 2 diabetes mellitus with complication (HCC)   . Discitis 01/10/2015  . Osteomyelitis of thoracic spine (HCC) 01/10/2015  . Thoracic spine pain 11/28/2014  . Elevated PSA 11/08/2014  . Prosthetic hip infection (HCC) 02/16/2013  . MSSA (methicillin susceptible Staphylococcus aureus) infection   . Infected pacemaker (HCC)   . Closed dislocation of right hip (HCC) 10/02/2012    Class: Acute  . Retroperitoneal fluid collection 08/28/2012  . Staphylococcus aureus bacteremia 08/27/2012  . Hypokalemia 08/26/2012  . DM type 2 (diabetes mellitus,  type 2) (HCC) 08/26/2012  . Thrombocytopenia (HCC) 03/21/2012  . Hypothyroidism 02/14/2012  . Hyperlipidemia LDL goal <100 02/14/2012  . Benign prostatic hyperplasia 02/14/2012  . Lower extremity weakness 02/11/2012  . Anemia in chronic kidney disease 02/11/2012  . Chronic kidney disease, stage III (moderate) (HCC) 02/11/2012  . Atrial fibrillation (HCC) 02/11/2012  . Spinal stenosis 02/11/2012  . Hip dislocation, right (HCC) 11/30/2010  . Essential hypertension 11/30/2010    Orientation RESPIRATION BLADDER Height & Weight     Self, Time, Situation, Place  O2 nasal canula 3: Continent Weight: 244 lb 11.4 oz (111 kg) Height:  5\' 11"  (180.3 cm)  BEHAVIORAL SYMPTOMS/MOOD NEUROLOGICAL BOWEL NUTRITION STATUS      Continent Diet (see discharge summary)  AMBULATORY STATUS COMMUNICATION OF NEEDS Skin   Extensive Assist Verbally Other (Comment)(deep tissue injury on heel with foam)                       Personal Care Assistance Level of Assistance  Bathing, Feeding, Dressing Bathing Assistance: Maximum assistance Feeding assistance: Independent Dressing Assistance: Maximum assistance     Functional Limitations Info  Sight, Hearing, Speech Sight Info: Adequate Hearing Info: Adequate Speech Info: Adequate    SPECIAL CARE FACTORS FREQUENCY                       Contractures Contractures Info: Not present    Additional Factors Info  Code Status, Allergies, Psychotropic Code Status Info: DNR Allergies Info: ACE INHIBITORS, GABAPENTIN, NSAIDS, ZOLPIDEM  Psychotropic Info: buPROPion (WELLBUTRIN SR) 12 hr tablet 100 mg 2x daily  PO; escitalopram (LEXAPRO) tablet 10 mg daily PO; rOPINIRole (REQUIP) tablet 0.5 mg daily at bedtime PO    Insulin info: insulin aspart (novoLOG) injection 0-5 Units daily at bedtime; insulin aspart (novoLOG) injection 0-9 Units 3x daily with meals     Current Medications (03/09/2018):  This is the current hospital active medication list Current  Facility-Administered Medications  Medication Dose Route Frequency Provider Last Rate Last Dose  . 0.9 %  sodium chloride infusion   Intravenous Continuous Dorcas Carrow, MD 100 mL/hr at 03/07/18 1830    . acetaminophen (TYLENOL) tablet 650 mg  650 mg Oral Q6H PRN Dorcas Carrow, MD   650 mg at 03/09/18 0902   Or  . acetaminophen (TYLENOL) suppository 650 mg  650 mg Rectal Q6H PRN Dorcas Carrow, MD      . buPROPion (WELLBUTRIN SR) 12 hr tablet 100 mg  100 mg Oral BID Dorcas Carrow, MD   100 mg at 03/09/18 0907  . divalproex (DEPAKOTE SPRINKLE) capsule 125 mg  125 mg Oral QHS Dorcas Carrow, MD   125 mg at 03/08/18 2217  . docusate sodium (COLACE) capsule 100 mg  100 mg Oral BID Dorcas Carrow, MD   100 mg at 03/09/18 0907  . escitalopram (LEXAPRO) tablet 10 mg  10 mg Oral Daily Dorcas Carrow, MD   10 mg at 03/09/18 0907  . feeding supplement (ENSURE ENLIVE) (ENSURE ENLIVE) liquid 237 mL  237 mL Oral BID BM Emokpae, Courage, MD   237 mL at 03/09/18 1001  . ferrous sulfate tablet 325 mg  325 mg Oral BID WC Dorcas Carrow, MD   325 mg at 03/09/18 0900  . finasteride (PROSCAR) tablet 5 mg  5 mg Oral QHS Dorcas Carrow, MD   5 mg at 03/08/18 2218  . HYDROmorphone (DILAUDID) injection 0.5 mg  0.5 mg Intravenous Q4H PRN Emokpae, Courage, MD      . insulin aspart (novoLOG) injection 0-5 Units  0-5 Units Subcutaneous QHS Ghimire, Kuber, MD      . insulin aspart (novoLOG) injection 0-9 Units  0-9 Units Subcutaneous TID WC Ghimire, Lyndel Safe, MD      . lactulose (CHRONULAC) 10 GM/15ML solution 10 g  10 g Oral QHS Dorcas Carrow, MD   10 g at 03/08/18 2221  . lactulose (CHRONULAC) 10 GM/15ML solution 30 g  30 g Oral Daily Dorcas Carrow, MD   30 g at 03/09/18 0908  . levothyroxine (SYNTHROID, LEVOTHROID) tablet 88 mcg  88 mcg Oral Q0600 Dorcas Carrow, MD   88 mcg at 03/09/18 0531  . MUSCLE RUB CREA   Topical BID PRN Yong Channel C, NP      . nafcillin 2 g in sodium chloride 0.9 % 100 mL IVPB  2 g  Intravenous Q4H Emokpae, Courage, MD 200 mL/hr at 03/09/18 1257 2 g at 03/09/18 1257  . ondansetron (ZOFRAN) tablet 4 mg  4 mg Oral Q6H PRN Dorcas Carrow, MD   4 mg at 03/07/18 2232   Or  . ondansetron (ZOFRAN) injection 4 mg  4 mg Intravenous Q6H PRN Dorcas Carrow, MD   4 mg at 03/07/18 1022  . pregabalin (LYRICA) capsule 75 mg  75 mg Oral QHS Dorcas Carrow, MD   75 mg at 03/08/18 2218  . rivaroxaban (XARELTO) tablet 20 mg  20 mg Oral Q supper Marvel Plan, MD   20 mg at 03/08/18 1729  . rOPINIRole (REQUIP) tablet 0.5 mg  0.5 mg Oral QHS Dorcas Carrow, MD   0.5 mg at  03/08/18 2218  . terazosin (HYTRIN) capsule 5 mg  5 mg Oral QHS Dorcas CarrowGhimire, Kuber, MD   5 mg at 03/08/18 2217  . tiZANidine (ZANAFLEX) tablet 2 mg  2 mg Oral TID Dorcas CarrowGhimire, Kuber, MD   2 mg at 03/09/18 0907  . torsemide (DEMADEX) tablet 10 mg  10 mg Oral Daily Dorcas CarrowGhimire, Kuber, MD   10 mg at 03/09/18 0907  . vitamin C (ASCORBIC ACID) tablet 500 mg  500 mg Oral BID Dorcas CarrowGhimire, Kuber, MD   500 mg at 03/09/18 0907     Discharge Medications: Please see discharge summary for a list of discharge medications.  Relevant Imaging Results:  Relevant Lab Results:   Additional Information SS# [id number]; pt would like to return with palliative care services  Doy HutchingIsabel H Lakara Weiland, LCSWA

## 2018-03-11 LAB — CULTURE, BLOOD (ROUTINE X 2)
Culture: NO GROWTH
Culture: NO GROWTH
SPECIAL REQUESTS: ADEQUATE
Special Requests: ADEQUATE

## 2018-03-14 ENCOUNTER — Non-Acute Institutional Stay: Payer: Medicare Other | Admitting: Primary Care

## 2018-03-15 ENCOUNTER — Non-Acute Institutional Stay: Payer: Medicare Other | Admitting: Primary Care

## 2018-03-16 ENCOUNTER — Non-Acute Institutional Stay: Payer: Medicare Other | Admitting: Primary Care

## 2018-03-16 DIAGNOSIS — F0281 Dementia in other diseases classified elsewhere with behavioral disturbance: Secondary | ICD-10-CM

## 2018-03-16 DIAGNOSIS — Z515 Encounter for palliative care: Secondary | ICD-10-CM

## 2018-03-16 DIAGNOSIS — G309 Alzheimer's disease, unspecified: Secondary | ICD-10-CM

## 2018-03-16 DIAGNOSIS — E118 Type 2 diabetes mellitus with unspecified complications: Secondary | ICD-10-CM

## 2018-03-16 NOTE — Progress Notes (Signed)
[facility] Consult Note Telephone: [phone]  Fax: [phone]  PATIENT NAME: [name] DOB: [birth year] MRN: [id number]  PRIMARY CARE PROVIDER:   Virgel Bouquet, MD  REFERRING PROVIDER:  Virgel Bouquet, MD [address] [place] [address]   RESPONSIBLE PARTY:   Extended Emergency Contact Information Primary Emergency Contact: Flinders,[name] Address: [address]          [place], [place] [address] Johnnette Litter of [place] Phone: [phone] Mobile Phone: [phone] Relation: Spouse Secondary Emergency Contact: Arlie Solomons [place] of [place] Mobile Phone: [phone] Relation: Daughter  Palliative Care is following patient on consultation referral from Dr. Aubery Lapping.  ASSESSMENT and RECOMMENDATIONS :   1.Constipation. Lactulose was stopped in the hospital due to vomiting.  Will recommend milk of magnesia or facility laxative of choice. Schedule laxative protocol e.g. senna bid, miralax, fiber supplement. Patient has had no BM times five days, instructed facility nurse to administer PRN dose.  Monitor mentation due to possible exacerbation of encephalopathy.   2. Pain:  Would recommend continuing  and increasing scheduling Tylenol for chronic pain. Patient complained of chronic arm pain also has chronic back pain Using pain relief rubs.    3. Hallucinations patient states he's having hallucinations, knows when they occur and has been focusing on an stable room  item until the delusion passes. If he is known to psychiatry it would be helpful to consult with them. May with draw meds high risk for hallucinations, e.g. depakote. Patient does have a psychiatric history which may complicate symptom management. May also benefit from SW referral for counseling.  4. Aspiration Risk: Recommend a Consultation with speech therapy as eating prone will most likely result in an aspiration situation. Patient states he is  not able to sit up to eat, in fact is drinking Pepsi lying prone. Also states he wants chopped food diet. Patient also has xerostomia and uses an over-the-counter preparation which he says does not really help.    5. Goals of care. DNR, MOST with DNR, comfort care, no feeding tube, limited use of IV and abx.  Discussed palliative versus hospice with DON, ADON.  Will continue to monitor for symptoms of decline. Patient states good intake and Is oriented appropriate and conversant.   Palliative care will continue to follow patient for goals of care clarification and symptom management. Return 2-4 weeks.  I spent 35 minutes providing this consultation,  from 1400 to 1435. More than 50% of the time in this consultation was spent coordinating communication.   HISTORY OF PRESENT ILLNESS:  [name] is a 85 y.o. year old male with multiple medical problems including Alzheimer's dementia, anxiety, OA, constipation, constipation, hypothyroid, MDD. Palliative Care was asked to help address goals of care.   CODE STATUS: DNR, MOST with DNR, comfort measures, limited use of iv, abx, no feeding tube  PPS: 30% HOSPICE ELIGIBILITY/DIAGNOSIS: TBD  PAST MEDICAL HISTORY:  Past Medical History:  Diagnosis Date  . Alzheimer's type dementia ([place]) 12/07/2016  . Anemia   . Anxiety   . Arthritis   . Bacteremia   . Bradycardia    a. PPM removed in 2014 due to bacteremia.  . Chronic atrial fibrillation   . Chronic right shoulder pain 06/17/2015  . CKD (chronic kidney disease), stage III ([place])   . Complication of anesthesia    " Terrible Nightmares"  . Constipation   . Depression   . Diabetes mellitus without complication (HCC)    Type  II  . DVT (deep venous thrombosis) ([place])    a. in 2019 - pt reports behind knee - was on Xarelto 37m BID now 248mdaily.  . Encounter for removal of cardiac resynchronization therapy pacemaker 2017  . Grief 06/17/2015  . History of blood product transfusion   .  Hypertension   . Hypothyroidism   . Infected pacemaker ([place]  . Major depression 06/17/2015  . MSSA (methicillin susceptible Staphylococcus aureus) infection   . OAB (overactive bladder)   . Overactive bladder   . Septic hip ([facility]  . Septicemia ([facility]  . Severe depression ([place]11/12/2016  . Staphylococcus aureus infection   . Thoracic spine pain 11/28/2014    SOCIAL HX:  Social History   Tobacco Use  . Smoking status: Never Smoker  . Smokeless tobacco: Never Used  Substance Use Topics  . Alcohol use: No    Alcohol/week: 0.0 standard drinks    ALLERGIES:  Allergies  Allergen Reactions  . Ace Inhibitors Cough  . Gabapentin Other (See Comments)    Unknown reaction  . Nsaids Other (See Comments)    Renal insufficiency   . Zolpidem Other (See Comments)    hallucinations Nightmares, irritable, nervous Disorientation     PERTINENT MEDICATIONS:  Outpatient Encounter Medications as of 03/16/2018  Medication Sig  . acetaminophen (TYLENOL) 500 MG tablet Take 500 mg by mouth 3 (three) times daily.  . Artificial Saliva (BIOTENE MOISTURIZING MOUTH MT) Use as directed 2 sprays in the mouth or throat every 6 (six) hours as needed (Dry mouth). Apply 2 sprays directly onto mouth and tongue. Spread thoroughly inside the mouth - every 6 hours as needed for dry mouth.   . Marland KitchenuPROPion (WELLBUTRIN SR) 100 MG 12 hr tablet Take 100 mg by mouth 2 (two) times daily.  . divalproex (DEPAKOTE SPRINKLE) 125 MG capsule Take 125 mg by mouth at bedtime.  . docusate sodium (COLACE) 100 MG capsule Take 100 mg by mouth 2 (two) times daily.   . Marland Kitchenscitalopram (LEXAPRO) 10 MG tablet Take 10 mg by mouth daily.   . ferrous sulfate 325 (65 FE) MG tablet Take 325 mg by mouth 2 (two) times daily with a meal.   . finasteride (PROSCAR) 5 MG tablet Take 5 mg by mouth at bedtime.   . Marland Kitchenactulose (CHRONULAC) 10 GM/15ML solution Take 10-30 g by mouth See admin instructions. Take 45 mls (30 g) by mouth every morning, take  15 mls (10 g) at bedtime. Hold for loose stool.  . Marland Kitchenevothyroxine (SYNTHROID, LEVOTHROID) 88 MCG tablet Take 88 mcg by mouth daily before breakfast.  . Lidocaine (ASPERCREME LIDOCAINE) 4 % PTCH Apply 3 patches topically See admin instructions. Apply one patch to left arm between shoulder and elbow every morning, remove every night (12 hrs later); apply 2nd patch between shoulder blades every morning, remove every night (12 hrs later); apply 3rd patch to right arm between shoulder and elbow every morning, remover every night (12 hrs later)  . lovastatin (MEVACOR) 40 MG tablet Take 40 mg by mouth at bedtime.   . nafcillin IVPB Inject 12 g into the vein daily for 14 days. As a continuous infusion. Indication:  MSSA discitis Last Day of Therapy:  03/23/2018 Labs - Once weekly:  CBC/D and BMP, Labs - Every other week:  ESR and CRP  . nystatin (NYSTATIN) powder Apply topically See admin instructions. Apply powder to skin fold rash twice daily until resolved  . pioglitazone (ACTOS) 15 MG tablet Take  15 mg by mouth daily.  . pregabalin (LYRICA) 75 MG capsule Take 1 capsule (75 mg total) by mouth daily. (Patient taking differently: Take 75 mg by mouth at bedtime. )  . rivaroxaban (XARELTO) 20 MG TABS tablet Take 20 mg by mouth daily.  Marland Kitchen rOPINIRole (REQUIP) 0.5 MG tablet Take 0.5 mg by mouth at bedtime.   Marland Kitchen telmisartan (MICARDIS) 40 MG tablet Take 40 mg by mouth daily.  Marland Kitchen terazosin (HYTRIN) 5 MG capsule Take 5 mg by mouth at bedtime.   Marland Kitchen tiZANidine (ZANAFLEX) 2 MG tablet Take 2 mg by mouth 3 (three) times daily.   Marland Kitchen torsemide (DEMADEX) 10 MG tablet Take 10 mg by mouth daily.   . vitamin C (ASCORBIC ACID) 500 MG tablet Take 500 mg by mouth 2 (two) times daily.   No facility-administered encounter medications on file as of 03/16/2018.     PHYSICAL EXAM:    VS Weight 258.3 lbs, was 244 lb in hospital, now with 4+ pedal edema. 98.1-84-18 100/68, pulse ox 95% General: NAD, frail appearing,  obese Cardiovascular: regular rate and rhythm, S1S2 Pulmonary: clear all fields, cough with  eating, high aspiration risk Abdomen: soft, nontender, + bowel sounds, no bm x 5 days, eating well per self report GU: no suprapubic tenderness, incontinence Extremities: 4+ LE edema, unable to move lower torso and legs,  Skin: no rashes, wounds on gross exam Neurological: Weakness , paraplegia, endorses hallucinations, dementia  Cyndia Skeeters DNP, AGPCNP-BC

## 2018-03-22 ENCOUNTER — Ambulatory Visit (INDEPENDENT_AMBULATORY_CARE_PROVIDER_SITE_OTHER): Payer: Medicare Other | Admitting: Infectious Disease

## 2018-03-22 ENCOUNTER — Emergency Department (HOSPITAL_COMMUNITY): Payer: Medicare Other

## 2018-03-22 ENCOUNTER — Encounter: Payer: Self-pay | Admitting: Infectious Disease

## 2018-03-22 ENCOUNTER — Encounter (HOSPITAL_COMMUNITY): Payer: Self-pay | Admitting: *Deleted

## 2018-03-22 ENCOUNTER — Inpatient Hospital Stay (HOSPITAL_COMMUNITY)
Admission: EM | Admit: 2018-03-22 | Discharge: 2018-03-27 | DRG: 871 | Disposition: E | Payer: Medicare Other | Source: Skilled Nursing Facility | Attending: Internal Medicine | Admitting: Internal Medicine

## 2018-03-22 ENCOUNTER — Inpatient Hospital Stay (HOSPITAL_COMMUNITY): Payer: Medicare Other

## 2018-03-22 DIAGNOSIS — R7881 Bacteremia: Secondary | ICD-10-CM | POA: Diagnosis not present

## 2018-03-22 DIAGNOSIS — F419 Anxiety disorder, unspecified: Secondary | ICD-10-CM | POA: Diagnosis present

## 2018-03-22 DIAGNOSIS — G92 Toxic encephalopathy: Secondary | ICD-10-CM | POA: Diagnosis present

## 2018-03-22 DIAGNOSIS — Z7189 Other specified counseling: Secondary | ICD-10-CM

## 2018-03-22 DIAGNOSIS — E1122 Type 2 diabetes mellitus with diabetic chronic kidney disease: Secondary | ICD-10-CM | POA: Diagnosis present

## 2018-03-22 DIAGNOSIS — A419 Sepsis, unspecified organism: Secondary | ICD-10-CM | POA: Diagnosis present

## 2018-03-22 DIAGNOSIS — Y95 Nosocomial condition: Secondary | ICD-10-CM | POA: Diagnosis present

## 2018-03-22 DIAGNOSIS — Z886 Allergy status to analgesic agent status: Secondary | ICD-10-CM

## 2018-03-22 DIAGNOSIS — D539 Nutritional anemia, unspecified: Secondary | ICD-10-CM | POA: Diagnosis present

## 2018-03-22 DIAGNOSIS — I509 Heart failure, unspecified: Secondary | ICD-10-CM

## 2018-03-22 DIAGNOSIS — I130 Hypertensive heart and chronic kidney disease with heart failure and stage 1 through stage 4 chronic kidney disease, or unspecified chronic kidney disease: Secondary | ICD-10-CM | POA: Diagnosis present

## 2018-03-22 DIAGNOSIS — Z888 Allergy status to other drugs, medicaments and biological substances status: Secondary | ICD-10-CM

## 2018-03-22 DIAGNOSIS — E119 Type 2 diabetes mellitus without complications: Secondary | ICD-10-CM

## 2018-03-22 DIAGNOSIS — K6819 Other retroperitoneal abscess: Secondary | ICD-10-CM

## 2018-03-22 DIAGNOSIS — E039 Hypothyroidism, unspecified: Secondary | ICD-10-CM | POA: Diagnosis present

## 2018-03-22 DIAGNOSIS — I5023 Acute on chronic systolic (congestive) heart failure: Secondary | ICD-10-CM

## 2018-03-22 DIAGNOSIS — K5900 Constipation, unspecified: Secondary | ICD-10-CM | POA: Diagnosis present

## 2018-03-22 DIAGNOSIS — E1169 Type 2 diabetes mellitus with other specified complication: Secondary | ICD-10-CM | POA: Diagnosis present

## 2018-03-22 DIAGNOSIS — A4901 Methicillin susceptible Staphylococcus aureus infection, unspecified site: Secondary | ICD-10-CM | POA: Diagnosis not present

## 2018-03-22 DIAGNOSIS — E785 Hyperlipidemia, unspecified: Secondary | ICD-10-CM | POA: Diagnosis present

## 2018-03-22 DIAGNOSIS — I482 Chronic atrial fibrillation, unspecified: Secondary | ICD-10-CM | POA: Diagnosis present

## 2018-03-22 DIAGNOSIS — N183 Chronic kidney disease, stage 3 unspecified: Secondary | ICD-10-CM | POA: Diagnosis present

## 2018-03-22 DIAGNOSIS — E876 Hypokalemia: Secondary | ICD-10-CM | POA: Diagnosis present

## 2018-03-22 DIAGNOSIS — Z66 Do not resuscitate: Secondary | ICD-10-CM | POA: Diagnosis present

## 2018-03-22 DIAGNOSIS — M4644 Discitis, unspecified, thoracic region: Secondary | ICD-10-CM | POA: Diagnosis present

## 2018-03-22 DIAGNOSIS — J9601 Acute respiratory failure with hypoxia: Secondary | ICD-10-CM | POA: Diagnosis present

## 2018-03-22 DIAGNOSIS — M4624 Osteomyelitis of vertebra, thoracic region: Secondary | ICD-10-CM

## 2018-03-22 DIAGNOSIS — F05 Delirium due to known physiological condition: Secondary | ICD-10-CM | POA: Diagnosis not present

## 2018-03-22 DIAGNOSIS — E669 Obesity, unspecified: Secondary | ICD-10-CM | POA: Diagnosis present

## 2018-03-22 DIAGNOSIS — N400 Enlarged prostate without lower urinary tract symptoms: Secondary | ICD-10-CM | POA: Diagnosis present

## 2018-03-22 DIAGNOSIS — B9561 Methicillin susceptible Staphylococcus aureus infection as the cause of diseases classified elsewhere: Secondary | ICD-10-CM | POA: Diagnosis present

## 2018-03-22 DIAGNOSIS — I4891 Unspecified atrial fibrillation: Secondary | ICD-10-CM | POA: Diagnosis present

## 2018-03-22 DIAGNOSIS — J189 Pneumonia, unspecified organism: Secondary | ICD-10-CM | POA: Diagnosis present

## 2018-03-22 DIAGNOSIS — F329 Major depressive disorder, single episode, unspecified: Secondary | ICD-10-CM | POA: Diagnosis present

## 2018-03-22 DIAGNOSIS — Z8249 Family history of ischemic heart disease and other diseases of the circulatory system: Secondary | ICD-10-CM

## 2018-03-22 DIAGNOSIS — G929 Unspecified toxic encephalopathy: Secondary | ICD-10-CM

## 2018-03-22 DIAGNOSIS — I4821 Permanent atrial fibrillation: Secondary | ICD-10-CM | POA: Diagnosis not present

## 2018-03-22 DIAGNOSIS — R0603 Acute respiratory distress: Secondary | ICD-10-CM

## 2018-03-22 DIAGNOSIS — Z96641 Presence of right artificial hip joint: Secondary | ICD-10-CM | POA: Diagnosis present

## 2018-03-22 DIAGNOSIS — E870 Hyperosmolality and hypernatremia: Secondary | ICD-10-CM | POA: Diagnosis present

## 2018-03-22 DIAGNOSIS — F0281 Dementia in other diseases classified elsewhere with behavioral disturbance: Secondary | ICD-10-CM | POA: Diagnosis present

## 2018-03-22 DIAGNOSIS — Z515 Encounter for palliative care: Secondary | ICD-10-CM | POA: Diagnosis present

## 2018-03-22 DIAGNOSIS — S73004A Unspecified dislocation of right hip, initial encounter: Secondary | ICD-10-CM | POA: Diagnosis present

## 2018-03-22 DIAGNOSIS — E1101 Type 2 diabetes mellitus with hyperosmolarity with coma: Secondary | ICD-10-CM | POA: Diagnosis not present

## 2018-03-22 DIAGNOSIS — G8929 Other chronic pain: Secondary | ICD-10-CM | POA: Diagnosis present

## 2018-03-22 DIAGNOSIS — Z7901 Long term (current) use of anticoagulants: Secondary | ICD-10-CM

## 2018-03-22 DIAGNOSIS — Z79890 Hormone replacement therapy (postmenopausal): Secondary | ICD-10-CM

## 2018-03-22 DIAGNOSIS — Z7984 Long term (current) use of oral hypoglycemic drugs: Secondary | ICD-10-CM

## 2018-03-22 DIAGNOSIS — T8459XD Infection and inflammatory reaction due to other internal joint prosthesis, subsequent encounter: Secondary | ICD-10-CM

## 2018-03-22 DIAGNOSIS — T827XXD Infection and inflammatory reaction due to other cardiac and vascular devices, implants and grafts, subsequent encounter: Secondary | ICD-10-CM

## 2018-03-22 DIAGNOSIS — R29898 Other symptoms and signs involving the musculoskeletal system: Secondary | ICD-10-CM | POA: Diagnosis not present

## 2018-03-22 DIAGNOSIS — R5381 Other malaise: Secondary | ICD-10-CM | POA: Diagnosis present

## 2018-03-22 DIAGNOSIS — Z792 Long term (current) use of antibiotics: Secondary | ICD-10-CM

## 2018-03-22 DIAGNOSIS — Z96649 Presence of unspecified artificial hip joint: Secondary | ICD-10-CM

## 2018-03-22 DIAGNOSIS — Z6837 Body mass index (BMI) 37.0-37.9, adult: Secondary | ICD-10-CM

## 2018-03-22 DIAGNOSIS — I10 Essential (primary) hypertension: Secondary | ICD-10-CM | POA: Diagnosis present

## 2018-03-22 DIAGNOSIS — I4819 Other persistent atrial fibrillation: Secondary | ICD-10-CM | POA: Diagnosis not present

## 2018-03-22 DIAGNOSIS — Z86718 Personal history of other venous thrombosis and embolism: Secondary | ICD-10-CM

## 2018-03-22 DIAGNOSIS — G062 Extradural and subdural abscess, unspecified: Secondary | ICD-10-CM | POA: Diagnosis present

## 2018-03-22 DIAGNOSIS — G8220 Paraplegia, unspecified: Secondary | ICD-10-CM | POA: Diagnosis present

## 2018-03-22 DIAGNOSIS — Z79899 Other long term (current) drug therapy: Secondary | ICD-10-CM

## 2018-03-22 DIAGNOSIS — E871 Hypo-osmolality and hyponatremia: Secondary | ICD-10-CM | POA: Diagnosis present

## 2018-03-22 DIAGNOSIS — G309 Alzheimer's disease, unspecified: Secondary | ICD-10-CM | POA: Diagnosis present

## 2018-03-22 DIAGNOSIS — I5032 Chronic diastolic (congestive) heart failure: Secondary | ICD-10-CM | POA: Diagnosis present

## 2018-03-22 HISTORY — DX: Heart failure, unspecified: I50.9

## 2018-03-22 LAB — COMPREHENSIVE METABOLIC PANEL
ALT: 9 U/L (ref 0–44)
ANION GAP: 12 (ref 5–15)
AST: 20 U/L (ref 15–41)
Albumin: 2.4 g/dL — ABNORMAL LOW (ref 3.5–5.0)
Alkaline Phosphatase: 50 U/L (ref 38–126)
BUN: 10 mg/dL (ref 8–23)
CO2: 28 mmol/L (ref 22–32)
Calcium: 8.3 mg/dL — ABNORMAL LOW (ref 8.9–10.3)
Chloride: 107 mmol/L (ref 98–111)
Creatinine, Ser: 1.16 mg/dL (ref 0.61–1.24)
GFR calc Af Amer: >60 mL/min (ref >60)
GFR calc non Af Amer: 57 mL/min — ABNORMAL LOW (ref >60)
GLUCOSE: 102 mg/dL — AB (ref 70–99)
Potassium: 2.8 mmol/L — ABNORMAL LOW (ref 3.5–5.1)
Sodium: 147 mmol/L — ABNORMAL HIGH (ref 135–145)
Total Bilirubin: 2.1 mg/dL — ABNORMAL HIGH (ref 0.3–1.2)
Total Protein: 5.9 g/dL — ABNORMAL LOW (ref 6.5–8.1)

## 2018-03-22 LAB — CBC WITH DIFFERENTIAL/PLATELET
ABS IMMATURE GRANULOCYTES: 0 10*3/uL (ref 0.00–0.07)
Basophils Absolute: 0.1 10*3/uL (ref 0.0–0.1)
Basophils Relative: 1 %
Eosinophils Absolute: 0 10*3/uL (ref 0.0–0.5)
Eosinophils Relative: 0 %
HCT: 37.3 % — ABNORMAL LOW (ref 39.0–52.0)
HEMOGLOBIN: 11.4 g/dL — AB (ref 13.0–17.0)
Lymphocytes Relative: 2 %
Lymphs Abs: 0.1 10*3/uL — ABNORMAL LOW (ref 0.7–4.0)
MCH: 31 pg (ref 26.0–34.0)
MCHC: 30.6 g/dL (ref 30.0–36.0)
MCV: 101.4 fL — ABNORMAL HIGH (ref 80.0–100.0)
Monocytes Absolute: 0.6 10*3/uL (ref 0.1–1.0)
Monocytes Relative: 9 %
NEUTROS ABS: 6.2 10*3/uL (ref 1.7–7.7)
NRBC: 0 /100{WBCs}
Neutrophils Relative %: 88 %
Platelets: 180 10*3/uL (ref 150–400)
RBC: 3.68 MIL/uL — ABNORMAL LOW (ref 4.22–5.81)
RDW: 16.2 % — ABNORMAL HIGH (ref 11.5–15.5)
WBC: 7 10*3/uL (ref 4.0–10.5)
nRBC: 0 % (ref 0.0–0.2)

## 2018-03-22 LAB — INFLUENZA PANEL BY PCR (TYPE A & B)
Influenza A By PCR: NEGATIVE
Influenza B By PCR: NEGATIVE

## 2018-03-22 LAB — BRAIN NATRIURETIC PEPTIDE: B Natriuretic Peptide: 179.4 pg/mL — ABNORMAL HIGH (ref 0.0–100.0)

## 2018-03-22 LAB — LACTIC ACID, PLASMA
Lactic Acid, Venous: 2.2 mmol/L (ref 0.5–1.9)
Lactic Acid, Venous: 1.8 mmol/L (ref 0.5–1.9)

## 2018-03-22 LAB — TROPONIN I: Troponin I: 0.05 ng/mL (ref <0.03)

## 2018-03-22 MED ORDER — SODIUM CHLORIDE 0.9 % IV SOLN
2.0000 g | Freq: Two times a day (BID) | INTRAVENOUS | Status: DC
  Administered 2018-03-22 – 2018-03-24 (×4): 2 g via INTRAVENOUS
  Filled 2018-03-22 (×4): qty 20

## 2018-03-22 MED ORDER — CEPHALEXIN 500 MG PO CAPS: 500.0000 mg | ORAL_CAPSULE | Freq: Four times a day (QID) | ORAL | 11 refills | Status: DC

## 2018-03-22 MED ORDER — INSULIN ASPART 100 UNIT/ML ~~LOC~~ SOLN: 0.0000 [IU] | Freq: Every day | SUBCUTANEOUS | Status: DC

## 2018-03-22 MED ORDER — ACETAMINOPHEN 650 MG RE SUPP: 650.0000 mg | Freq: Four times a day (QID) | RECTAL | Status: DC | PRN

## 2018-03-22 MED ORDER — POTASSIUM CHLORIDE 10 MEQ/100ML IV SOLN
10.0000 meq | INTRAVENOUS | Status: AC
  Administered 2018-03-22 (×2): 10 meq via INTRAVENOUS
  Filled 2018-03-22 (×3): qty 100

## 2018-03-22 MED ORDER — FUROSEMIDE 10 MG/ML IJ SOLN
40.0000 mg | Freq: Once | INTRAMUSCULAR | Status: AC
  Administered 2018-03-22: 40 mg via INTRAVENOUS
  Filled 2018-03-22: qty 4

## 2018-03-22 MED ORDER — IOPAMIDOL (ISOVUE-370) INJECTION 76%
INTRAVENOUS | Status: AC
  Administered 2018-03-22: 18:00:00
  Filled 2018-03-22: qty 100

## 2018-03-22 MED ORDER — ACETAMINOPHEN 325 MG PO TABS
650.0000 mg | ORAL_TABLET | Freq: Four times a day (QID) | ORAL | Status: DC | PRN
  Administered 2018-03-23: 650 mg via ORAL
  Filled 2018-03-22: qty 2

## 2018-03-22 NOTE — ED Notes (Signed)
Received call from Tama Gander NP orders received for deep suction by resp and place back on Bi-Pap.

## 2018-03-22 NOTE — ED Notes (Signed)
Pt continues to cough up thick mucus.  O2 sat's on [place] 88%-90%.  Admitting MD paged.

## 2018-03-22 NOTE — ED Triage Notes (Signed)
Per EMS- pt is from [facility]. Pt was recently diagnosed with pneumonia. Hx of chf, pt received 1L of NS at facility prior to transport. 88% on NRB initially. Pt placed on CPAP. A&Ox4. 20G Lhand. Received 125mg  solumedrol. 2g of magnesium. Received 0.3 of epi IM. 1 nitro en route. 126/77 HR98 RR 28 spO284% CBG132

## 2018-03-22 NOTE — Progress Notes (Signed)
Chief complaint: Follow-up for severe MSSA infection including worsening discitis  Now with productive cough sore throat and worsening edema  Subjective:    Patient ID: [name], male    DOB: Mar 19, 1930, 85 y.o.   MRN: [id number]  HPI  Keona Sheffler is a 85 y.o. male year old male known by me since I met him in 2014. At that time he had with Pacemaker, Right prosthetic hip, Laminectomy in January of 2014 admitted in August 2014 with right leg pain weakness fevers. He has been found to have MSSA bacteremia and a large retroperitoneal abscess that is adjacent to his prosthetic hip.  Orthopedics performed resection of Radical irrigation and debridement right total hip with removal of all synovium, revision of femoral head, revision of polyethylene liner placement On 09/02/12  TEE failed to show vegetations on valves or overtly on PM. PM is out. He returned to the hospital with dislocation of his hip. This was able to be reduced without surgical intervention. My partner Dr. Johnnye Sima saw the patient and noted his sedimentation rate and C-reactive protein is still be quite high. Patient completed a course of 8 weeks of cefazolin 2 g IV every 8 hours at the skilled nursing facility along with oral rifampin twice daily.   I changed him to oral Keflex when his IV antibiotics were stopped and he continued on high-dose Keflex with rifampin since then.   He had recurrent hip dislocations since then.  He was readmitted in late October 2014 and underwent  Irrigation and debridement of infected right total hip with removal of all synovium, revision of femoral head to a +5 40 mm metal head, revision of polyethylene liner to a constrained liner  He had remained on keflex 544m po tid in the interim but stopped the rifampin.  His hip pain dissapeared but he then developed pain in the right leg itself and difficulty raising leg vs gravity though he can bear weight on it in fall of 2015.  Since  then he has experienced recurrence of MSSAinfectionof his hip requiring  Radical irrigation and debridement of infected hybrid right total hip with removal of all synovium, removal of all components including cement going down the femur and the distal cement restrictor Hemovac drains. Placement of acetabular polymethylmethacrylate spacer   By Dr. RMayer Camelon July 16th, 2016.  intraop cultures yielded no organism but he was on keflex (and rifampin prior to surgery). He had been placed back on Ancef IV and then transitioned back to oral keflex currently at 1 g po bid.  InNovember we found that he had recurrence of pain in his thoracic region including his ribs. He underwent a CTA that did not show a PE on 11/26/14. He was not having fevers but his pain in his ribs and lower back is much worse.  We obtained MRI with sedation which showed:  Acute discitis osteomyelitis at T9-T10, T12-L1. Early discitis at T8-T9 and L4-L5, but no epidural or paraspinal abscess.  We had him urgently admitted to the hospital and had the antibiotics held. IR aspirate from the disk yielded no organisms and he was restarted on IV ancef. His back pain had intiially improved substantially since then typically worse when getting up from supine position..Marland Kitchen He then had worsening back pain..  It has been specially problematic in the am when he attempts to get up from supine position and he has "Tsunami of pain."  His ESR was completely normal and CRP was normalizing.  Ihad offered  repeat MRI now but he would prefered to wait esp since he requires conscious sedation due to the pain.  At last visithe had continued to suffer form "Tsunami" of pain when changing position. He told me that this was less of an issue when he had the PICC line but that handling the PICC line forced him to move more carefully.  I saw him in December 2017then in November of 2018 and he told me that over 3 months prior he had  worsening of pain.  He washere with his daughter apparently he has been trying to do more physical therapy with a walker and been developing pain in his hands and arms attempting to use the walker. He as alsounfortunately is experiencing stress to the fact that his wife hadbecome quite sick and is unable to help him with many of his activities of daily living for which she requires assistance.   Hewaslargely spending most of his time in a wheelchair.He was at that timefrankly overtly depressed and tearful during exam.  His painwasnot well managed presently and he is still trying to get this under better control.  I last saw him in January 2019 where he was still having severe pain at times though less so than the time before I had seen him.  In the interim in January of this year of 2020 admitted after developing progressive spasm in his legs with progressive lower extremity weakness and numbness.  MRI was done of the thoracic spine and showed evidence of an epidural abscess.  Attempt was made by interventional radiology to aspirate the collection but he could not tolerate the procedure initially.  Eventually an attempt was made to aspirate the abscess and this was done.  Unfortunately the cultures did not yield any organisms.  When this happened he was rationally placed back on cefazolin given the fact that he had only grown methicillin sensitive staph before and the only active antibiotic he had been on was cephalexin.  He had been on high-dose cefazolin as prescribed by [place] when he was again admitted to the ER after worsening confusion and a left-sided facial droop.  He was obtunded.  Stroke team was called and it was again then decided not to do TPA.  He thought to be suffering from a metabolic encephalopathy.  He had MRI of the brain which was normal MRI of the thoracic spine and lumbar spine showed progressive destruction of the T9 vertebra and worsening epidural  infection with cord compression.  He was evaluated Dr. Vertell Limber with neurosurgery.  Dr. Vertell Limber felt that he was not a candidate for neurosurgery given his extensive morbidities and high risk for further morbidity and mortality in the operating room and the fact that his paraplegia would not likely be corrected by such an aggressive intervention.  We were consulted due to concerns about whether he was on the right antibiotics or not.  Certainly I think methicillin sensitive staph aureus is the only organism that should be isolated given his history and documented cultures which I know quite extensively.  His daughter with whom I talked to again relayed quite a bit of concern about his encephalopathy.  He apparently is been hallucinating quite a bit and been confused.  This is a fairly new abrupt worsening superimposed against a chronic progression of cognitive decline though again is more abruptly worse.  I will note that he is also separate them is fairly severe pression at times in the past in part due to all of  his multiple medical problems.  Of note his wife also is suffering from pancreatic cancer.IN the hospital we changed him to [place] on chance that he had CNS infection. He actually did improve  He was seen by palliative care and he and family understand that we CANNOT cure this infection and he may in fact worsen  We opted to  Continue the NAF for at least 2 weeks and then reasssess desire for further IV abx vs switch back to po keflex.  He presents today in clinic with coughing with productive sputum and rattly breath sounds appears to be volume overloaded and has increased lower extremity edema.  He has been prescribed levofloxacin by the skilled nursing facility.  He also has a sore throat with all of his constant coughing.  Suspect he has suffered a congestive heart failure exacerbation in the context refusing high volumes of fluid in the form of his IV nafcillin and having  insufficient diuresis.    Past Medical History:  Diagnosis Date  . Alzheimer's type dementia ([place]) 12/07/2016  . Anemia   . Anxiety   . Arthritis   . Bacteremia   . Bradycardia    a. PPM removed in 2014 due to bacteremia.  . Chronic atrial fibrillation   . Chronic right shoulder pain 06/17/2015  . CKD (chronic kidney disease), stage III ([place])   . Complication of anesthesia    " Terrible Nightmares"  . Constipation   . Depression   . Diabetes mellitus without complication ([place])    Type II  . DVT (deep venous thrombosis) ([place])    a. in 2019 - pt reports behind knee - was on Xarelto 67m BID now 268mdaily.  . Encounter for removal of cardiac resynchronization therapy pacemaker 2017  . Grief 06/17/2015  . History of blood product transfusion   . Hypertension   . Hypothyroidism   . Infected pacemaker ([facility]  . Major depression 06/17/2015  . MSSA (methicillin susceptible Staphylococcus aureus) infection   . OAB (overactive bladder)   . Overactive bladder   . Septic hip ([facility]  . Septicemia ([facility]  . Severe depression ([facility]11/12/2016  . Staphylococcus aureus infection   . Thoracic spine pain 11/28/2014    Past Surgical History:  Procedure Laterality Date  . COLONOSCOPY W/ POLYPECTOMY     small  . EYE SURGERY     bilateral  . HIP CLOSED REDUCTION Right 10/02/2012   Procedure: CLOSED REDUCTION HIP;  Surgeon: PeHessie DibbleMD;  [facility]ics;  Laterality: Right;  . ICD LEAD REMOVAL Left 08/31/2012   Procedure: ICD LEAD REMOVAL;  Surgeon: GrEvans LanceMD;  Location[facility] Service: Cardiovascular;  Laterality: Left;  . INCISION AND DRAINAGE HIP Right 09/02/2012   Procedure: IRRIGATION AND DEBRIDEMENT RIGHT HIP;  Surgeon: FrKerin SalenMD;  [facility] Service: [facility]ics;  Laterality: Right;  . INCISION AND DRAINAGE HIP Right 08/10/2014   Procedure: IRRIGATION AND DEBRIDEMENT RIGHT HIP SUPERFICIAL VS DEEP;  Surgeon: FrFrederik PearMD;   [facility]: [facility];  Laterality: Right;  . INSERT / REPLACE / REMOVE PACEMAKER     removed in 2017 d/t sepsis/notes 11/03/2017  . IR FLUORO GUIDED NEEDLE PLC ASPIRATION/INJECTION LOC  02/04/2018  . IR FLUORO GUIDED NEEDLE PLC ASPIRATION/INJECTION LOC  02/04/2018  . JOINT REPLACEMENT    . LUMBAR LAMINECTOMY/DECOMPRESSION MICRODISCECTOMY  02/14/2012   Procedure: LUMBAR LAMINECTOMY/DECOMPRESSION MICRODISCECTOMY 1 LEVEL;  Surgeon: DaShanon Brow  Adah Salvage, MD;  Location: [facility];  Service: Neurosurgery;  Laterality: N/A;  Thoracic twelve - Lumbar one decompressive laminectomy.  Marland Kitchen RADIOLOGY WITH ANESTHESIA N/A 01/10/2015   Procedure: MRI LUMBAR SPINE WITH/WITHOUT   (RADIOLOGY WITH ANESTHESIA);  Surgeon: Medication Radiologist, MD;  Location: [facility];  Service: Radiology;  Laterality: N/A;  . RADIOLOGY WITH ANESTHESIA N/A 02/04/2018   Procedure: Spinal biopsy;  Surgeon: Luanne Bras, MD;  Location: [facility];  Service: Radiology;  Laterality: N/A;  . TEE WITHOUT CARDIOVERSION N/A 08/30/2012   Procedure: TRANSESOPHAGEAL ECHOCARDIOGRAM (TEE);  Surgeon: Larey Dresser, MD;  Location: [facility];  Service: Cardiovascular;  Laterality: N/A;  . TONSILLECTOMY    . TOTAL HIP REVISION Right 09/02/2012   Procedure: REVISION OF POLYETHYLENE LINER AND FEMORAL HEAD ;  Surgeon: Kerin Salen, MD;  Location: [facility];  Service: Orthopedics;  Laterality: Right;  . TOTAL HIP REVISION Right 11/28/2012   Procedure: TOTAL HIP REVISION With Placement of Contrain Liner;  Surgeon: Kerin Salen, MD;  Location: [facility];  Service: Orthopedics;  Laterality: Right;    Family History  Problem Relation Age of Onset  . Heart failure Mother       Social History   Socioeconomic History  . Marital status: Married    Spouse name: Not on file  . Number of children: Not on file  . Years of education: Not on file  . Highest education level: Not on file  Occupational History  . Not on file  Social Needs  . Financial  resource strain: Not on file  . Food insecurity:    Worry: Not on file    Inability: Not on file  . Transportation needs:    Medical: Not on file    Non-medical: Not on file  Tobacco Use  . Smoking status: Never Smoker  . Smokeless tobacco: Never Used  Substance and Sexual Activity  . Alcohol use: No    Alcohol/week: 0.0 standard drinks  . Drug use: No  . Sexual activity: Not on file  Lifestyle  . Physical activity:    Days per week: Not on file    Minutes per session: Not on file  . Stress: Not on file  Relationships  . Social connections:    Talks on phone: Not on file    Gets together: Not on file    Attends religious service: Not on file    Active member of club or organization: Not on file    Attends meetings of clubs or organizations: Not on file    Relationship status: Not on file  Other Topics Concern  . Not on file  Social History Narrative  . Not on file    Allergies  Allergen Reactions  . Ace Inhibitors Cough  . Gabapentin Other (See Comments)    Unknown reaction  . Nsaids Other (See Comments)    Renal insufficiency   . Zolpidem Other (See Comments)    hallucinations Nightmares, irritable, nervous Disorientation     Current Outpatient Medications:  .  acetaminophen (TYLENOL) 500 MG tablet, Take 500 mg by mouth 3 (three) times daily., Disp: , Rfl:  .  Artificial Saliva (BIOTENE MOISTURIZING MOUTH MT), Use as directed 2 sprays in the mouth or throat every 6 (six) hours as needed (Dry mouth). Apply 2 sprays directly onto mouth and tongue. Spread thoroughly inside the mouth - every 6 hours as needed for dry mouth. , Disp: , Rfl:  .  buPROPion (WELLBUTRIN SR) 100 MG 12  hr tablet, Take 100 mg by mouth 2 (two) times daily., Disp: , Rfl:  .  divalproex (DEPAKOTE SPRINKLE) 125 MG capsule, Take 125 mg by mouth at bedtime., Disp: , Rfl:  .  docusate sodium (COLACE) 100 MG capsule, Take 100 mg by mouth 2 (two) times daily. , Disp: , Rfl:  .  escitalopram  (LEXAPRO) 10 MG tablet, Take 10 mg by mouth daily. , Disp: , Rfl:  .  ferrous sulfate 325 (65 FE) MG tablet, Take 325 mg by mouth 2 (two) times daily with a meal. , Disp: , Rfl:  .  finasteride (PROSCAR) 5 MG tablet, Take 5 mg by mouth at bedtime. , Disp: , Rfl:  .  lactulose (CHRONULAC) 10 GM/15ML solution, Take 10-30 g by mouth See admin instructions. Take 45 mls (30 g) by mouth every morning, take 15 mls (10 g) at bedtime. Hold for loose stool., Disp: , Rfl:  .  levothyroxine (SYNTHROID, LEVOTHROID) 88 MCG tablet, Take 88 mcg by mouth daily before breakfast., Disp: , Rfl:  .  Lidocaine (ASPERCREME LIDOCAINE) 4 % PTCH, Apply 3 patches topically See admin instructions. Apply one patch to left arm between shoulder and elbow every morning, remove every night (12 hrs later); apply 2nd patch between shoulder blades every morning, remove every night (12 hrs later); apply 3rd patch to right arm between shoulder and elbow every morning, remover every night (12 hrs later), Disp: , Rfl:  .  lovastatin (MEVACOR) 40 MG tablet, Take 40 mg by mouth at bedtime. , Disp: , Rfl:  .  nafcillin IVPB, Inject 12 g into the vein daily for 14 days. As a continuous infusion. Indication:  MSSA discitis Last Day of Therapy:  03/23/2018 Labs - Once weekly:  CBC/D and BMP, Labs - Every other week:  ESR and CRP, Disp: 14 Units, Rfl: 0 .  nystatin (NYSTATIN) powder, Apply topically See admin instructions. Apply powder to skin fold rash twice daily until resolved, Disp: , Rfl:  .  pioglitazone (ACTOS) 15 MG tablet, Take 15 mg by mouth daily., Disp: , Rfl:  .  pregabalin (LYRICA) 75 MG capsule, Take 1 capsule (75 mg total) by mouth daily. (Patient taking differently: Take 75 mg by mouth at bedtime. ), Disp: 5 capsule, Rfl: 0 .  rivaroxaban (XARELTO) 20 MG TABS tablet, Take 20 mg by mouth daily., Disp: , Rfl:  .  rOPINIRole (REQUIP) 0.5 MG tablet, Take 0.5 mg by mouth at bedtime. , Disp: , Rfl:  .  telmisartan (MICARDIS) 40 MG tablet,  Take 40 mg by mouth daily., Disp: , Rfl:  .  terazosin (HYTRIN) 5 MG capsule, Take 5 mg by mouth at bedtime. , Disp: , Rfl:  .  tiZANidine (ZANAFLEX) 2 MG tablet, Take 2 mg by mouth 3 (three) times daily. , Disp: , Rfl:  .  torsemide (DEMADEX) 10 MG tablet, Take 10 mg by mouth daily. , Disp: , Rfl:  .  vitamin C (ASCORBIC ACID) 500 MG tablet, Take 500 mg by mouth 2 (two) times daily., Disp: , Rfl:    Review of Systems  Constitutional: Negative for activity change, appetite change, chills, diaphoresis, fatigue and unexpected weight change.  HENT: Positive for sore throat.   Eyes: Negative for visual disturbance.  Respiratory: Positive for cough and shortness of breath. Negative for chest tightness.   Cardiovascular: Positive for leg swelling.  Gastrointestinal: Negative for abdominal distention, abdominal pain, anal bleeding, blood in stool, constipation, diarrhea, nausea and vomiting.  Genitourinary: Negative for  difficulty urinating, dysuria, flank pain and hematuria.  Musculoskeletal: Positive for arthralgias, back pain and gait problem. Negative for joint swelling.  Skin: Negative for color change, pallor, rash and wound.  Neurological: Positive for weakness. Negative for dizziness, tremors and light-headedness.  Hematological: Negative for adenopathy. Does not bruise/bleed easily.  Psychiatric/Behavioral: Negative for agitation, behavioral problems, decreased concentration, dysphoric mood, self-injury, sleep disturbance and suicidal ideas. The patient is nervous/anxious.        Objective:   Physical Exam  Constitutional: He is oriented to person, place, and time. He appears well-developed and well-nourished. No distress.  HENT:  Head: Normocephalic and atraumatic.  Mouth/Throat: Oropharynx is clear and moist. No oropharyngeal exudate.  Eyes: Conjunctivae and EOM are normal. No scleral icterus.  Neck: Normal range of motion. Neck supple.  Cardiovascular: Normal rate, regular rhythm  and normal heart sounds. Exam reveals no gallop and no friction rub.  No murmur heard. Pulmonary/Chest: Effort normal. No respiratory distress. He has no wheezes. He has rhonchi in the right upper field, the right middle field, the right lower field, the left upper field, the left middle field and the left lower field. He has no rales. He exhibits no tenderness.  Abdominal: He exhibits no distension. There is no rebound and no guarding.  Musculoskeletal:     Left shoulder: He exhibits tenderness.     Left hip: He exhibits normal range of motion.  Neurological: He is alert and oriented to person, place, and time. He exhibits normal muscle tone.  He is not able to move his legs even in his toes.  Skin: Skin is warm and dry. He is not diaphoretic. No erythema. No pallor.  Psychiatric: His behavior is normal. Judgment and thought content normal. His mood appears not anxious. His speech is not delayed. He does not exhibit a depressed mood.  Nursing note and vitals reviewed.  Has 3+ pitting edema       Assessment & Plan:   Congestive heart failure exacerbation:   We will get rid of his PICC line and nafcillin and reduce the amount of fluids he is getting  Needs more aggressive diuresis and I defer this to his nursing home facility and/or cardiologist.   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 Placement of acetabular polymethylmethacrylate spacer sp  Nearly 6 weeks of IV cefazolin now on protracted keflex but with  new diskitis of the T spine sp aspiration which seemed  to be responding to IV ancef and likely due to the original culprit MSSA, Hefinished his 8 weeks of IV ancef and on keflex 1 gram in am and 500 mg in the pm then with recurrence this January retreated with ancef with worsening as above seen by Neurosurgery and not candidate for surgery. Functionally become paraplegic. He was obtunded in house and changed nafcillin with improvement in his cognition.  He was seen by palliative care and understands that his infection is not curable.  He also understands it may very well progress due to the fact that he is not a surgical candidate.    His family  understand that this infection cannot be cured.  We are going to change to Keflex as a less invasive therapy that he can take 4 times a day and avoid the problems of fluid overload with his nafcillin or infection due to PICC line.  Goals of care: The patient says " I have paid up and I prayed up the [facility] will take me when he will wants to"  Patient and family seem quite ready to change to a purely palliative approach.  We will plan on seeing him 1 back in 1 month's time.  ?  Pneumonia: His nursing home facility physicians have him on levofloxacin but I am skeptical that he actually has a pneumonia.  I would recommend discontinuing this but will defer that to them.  I spent greater than 40 minutes with the patient including greater than 50% of time in face to face counsel of the patient his family regarding the nature of his disc infection which is not curable the desire on my part to not do harm with aggressive therapy and to change therapy to oral antibiotics to help him with his heart failure exacerbation which I would like his other physicians increase diuretics for along with pivoting to more palliative care.  And in coordination of their care.

## 2018-03-22 NOTE — Patient Instructions (Signed)
I think you are having a CHF exacerbation due to volume overload in the context of the continuous nafcillin infusion.  We will discontinue your PICC line and the nafcillin.  I have changed over to Keflex 500 mg 4 times a day which I would like you to start.  Nursing home facility physicians have you on levofloxacin for pneumonia though I am skeptical that you actually have a superimposed pneumonia  I think you need increased diuresis and I would defer this to your nursing home and/or cardiologist management.  We will plan on seeing you in month 1 month's time.

## 2018-03-22 NOTE — ED Notes (Signed)
Pt incontinent of stool and urine.  Pt cleaned and changed.  Daughter remains at bedside.

## 2018-03-22 NOTE — ED Notes (Signed)
Pt given dinner tray.

## 2018-03-22 NOTE — ED Notes (Signed)
Per IV team it is ok to use rt arm after recent PICC line removal today.

## 2018-03-22 NOTE — ED Notes (Signed)
Pt tolerating being on [place]  Pt coughing up thick sputum

## 2018-03-22 NOTE — ED Notes (Signed)
Pt tolerated suctioning well.  Pt placed back on Bi-Pap.  Pt tolerating well.  Condom cath placed on pt.

## 2018-03-22 NOTE — H&P (Signed)
History and Physical    [name] NGE:[id number] DOB: [birth year] DOA: 03/11/2018  PCP: Bernette Redbird, MD Patient coming from: Skilled nursing facility   Chief Complaint: Shortness of breath, cough  HPI: [name] is a 85 y.o. 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 Algis Liming, chronic atrial fibrillation on apixaban, history of pacemaker placement status post removal in 2017 due to widespread MSSA bacteremia, chronic pain, depression/anxiety, type 2 diabetes, hypothyroidism BPH, hypertension, chronic diastolic dysfunction but normal echo on 02/01/2018 who presents from skilled nursing facility with acute hypoxic respiratory failure.  Patient reports that yesterday he began to have increasing tan-colored sputum production.  He also noted intermittent chills but no fevers.  He was somewhat more short of breath.  He initially had been treated for what was thought to be heart failure because of increasing lower extremity edema with oral diuretics.  Yesterday his facility did start oral levofloxacin due to concern for possible pneumonia.  Patient did see infectious disease today to follow-up on his prolonged IV antibiotic course with nafcillin.  Per the documentation there was concern the patient's edema was secondary to the solute load and nafcillin and so this was discontinued today he was transitioned to oral cephalexin.  He then went home and was noted to have increasing shortness of breath and hypoxia.  EMS was called and patient was noted to be  in respiratory distress and was placed on BiPAP.  Patient currently denies any shortness of breath, nausea, vomiting, diarrhea.  He continues to have cough productive of copious amounts of purulent sputum.  He denies any fevers or current chills.  He denies any diarrhea.  He is not have any abdominal pain.  He continues to have some feeling below his lower abdomen but denies any symptoms from this.  ED Course: In the ER patient's vitals were notable for mild hypoxia, tachypnea, tachycardia.  Review of Systems: As per HPI otherwise 10 point review of systems negative.  Lactate was 2.2.  Troponin was 0.05.  Markable other than mild macrocytosis.  BNP was 179.4.  Showed a potassium of 2.8, creatinine 1.16, sodium of 147.  Chest x-ray showed chronic disease with possible retrocardiac opacity.  CT a of the chest is pending.  Blood cultures were taken.  It was unclear if patient needed diuresis or IV antibiotics and so neither of these were given.   Past Medical History:  Diagnosis Date  . Alzheimer's type dementia (HCC) 12/07/2016  . Anemia   . Anxiety   . Arthritis   . Bacteremia   . Bradycardia    a. PPM removed in 2014 due to bacteremia.  . CHF exacerbation (HCC) 03/08/2018  . Chronic atrial fibrillation   . Chronic right shoulder pain 06/17/2015  . CKD (chronic kidney disease), stage III (HCC)   . Complication of anesthesia    " Terrible Nightmares"  . Constipation   . Depression   . Diabetes mellitus without complication (HCC)    Type II  . DVT (deep venous thrombosis) (HCC)    a. in 2019 - pt reports behind knee - was on Xarelto  BID now  daily.  . Encounter for  removal of cardiac resynchronization therapy pacemaker 2017  . Grief 06/17/2015  . History of blood product transfusion   . Hypertension   . Hypothyroidism   . Infected pacemaker (HCC)   . Major depression 06/17/2015  . MSSA (methicillin susceptible Staphylococcus aureus) infection   . OAB (overactive bladder)   .  Overactive bladder   . Septic hip (HCC)   . Septicemia (HCC)   . Severe depression (HCC) 12/07/2016  . Staphylococcus aureus infection   . Thoracic spine pain 11/28/2014    Past Surgical History:  Procedure Laterality Date  . COLONOSCOPY W/ POLYPECTOMY     small  . EYE SURGERY     bilateral  . HIP CLOSED REDUCTION Right 10/02/2012   Procedure: CLOSED REDUCTION HIP;  Surgeon: Velna Ochs, MD;  Location: [facility] OR;  Service: Orthopedics;  Laterality: Right;  . ICD LEAD REMOVAL Left 08/31/2012   Procedure: ICD LEAD REMOVAL;  Surgeon: Marinus Maw, MD;  Location[facility] OR;  Service: Cardiovascular;  Laterality: Left;  . INCISION AND DRAINAGE HIP Right 09/02/2012   Procedure: IRRIGATION AND DEBRIDEMENT RIGHT HIP;  Surgeon: Nestor Lewandowsky, MD;  Location: [facility] OR;  Service: Orthopedics;  Laterality: Right;  . INCISION AND DRAINAGE HIP Right 08/10/2014   Procedure: IRRIGATION AND DEBRIDEMENT RIGHT HIP SUPERFICIAL VS DEEP;  Surgeon: Gean Birchwood, MD;  Location: [facility] OR;  Service: Orthopedics;  Laterality: Right;  . INSERT / REPLACE / REMOVE PACEMAKER     removed in 2017 d/t sepsis/notes 11/03/2017  . IR FLUORO GUIDED NEEDLE PLC ASPIRATION/INJECTION LOC  02/04/2018  . IR FLUORO GUIDED NEEDLE PLC ASPIRATION/INJECTION LOC  02/04/2018  . JOINT REPLACEMENT    . LUMBAR LAMINECTOMY/DECOMPRESSION MICRODISCECTOMY  02/14/2012   Procedure: LUMBAR LAMINECTOMY/DECOMPRESSION MICRODISCECTOMY 1 LEVEL;  Surgeon: Tia Alert, MD;  Location: [facility] NEURO ORS;  Service: Neurosurgery;  Laterality: N/A;  Thoracic twelve - Lumbar one decompressive laminectomy.  Marland Kitchen RADIOLOGY WITH ANESTHESIA N/A 01/10/2015   Procedure: MRI LUMBAR SPINE WITH/WITHOUT   (RADIOLOGY WITH ANESTHESIA);  Surgeon: Medication Radiologist, MD;  Location: [facility] OR;  Service: Radiology;  Laterality: N/A;  . RADIOLOGY WITH ANESTHESIA N/A 02/04/2018   Procedure: Spinal biopsy;  Surgeon: Julieanne Cotton, MD;  Location: [facility] OR;  Service: Radiology;  Laterality: N/A;  . TEE  WITHOUT CARDIOVERSION N/A 08/30/2012   Procedure: TRANSESOPHAGEAL ECHOCARDIOGRAM (TEE);  Surgeon: Laurey Morale, MD;  Location: [facility] ENDOSCOPY;  Service: Cardiovascular;  Laterality: N/A;  . TONSILLECTOMY    . TOTAL HIP REVISION Right 09/02/2012   Procedure: REVISION OF POLYETHYLENE LINER AND FEMORAL HEAD ;  Surgeon: Nestor Lewandowsky, MD;  Location: [facility] OR;  Service: Orthopedics;  Laterality: Right;  . TOTAL HIP REVISION Right 11/28/2012   Procedure: TOTAL HIP REVISION With Placement of Contrain Liner;  Surgeon: Nestor Lewandowsky, MD;  Location: [facility] OR;  Service: Orthopedics;  Laterality: Right;     reports that he has never smoked. He has never used smokeless tobacco. He reports that he does not drink alcohol or use drugs.  Allergies  Allergen Reactions  . Ace Inhibitors Cough  . Gabapentin Other (See Comments)    Unknown reaction  . Nsaids Other (See Comments)    Renal insufficiency   . Zolpidem Other (See Comments)    hallucinations Nightmares, irritable, nervous Disorientation    Family History  Problem Relation Age of Onset  . Heart failure Mother      Prior to Admission medications   Medication Sig Start Date End Date Taking? Authorizing  Provider  acetaminophen (TYLENOL) 500 MG tablet Take 500 mg by mouth 3 (three) times daily.   Yes [provider]  Artificial Saliva (BIOTENE MOISTURIZING MOUTH MT) Use as directed 2 sprays in the mouth or throat every 6 (six) hours as needed (Dry mouth). Apply 2 sprays directly onto mouth and tongue. Spread thoroughly inside the mouth - every 6 hours as needed for dry mouth.    Yes [provider]  buPROPion (WELLBUTRIN SR) 100 MG 12 hr tablet Take 100 mg by mouth 2 (two) times daily.   Yes [provider]  cephALEXin (KEFLEX) 500 MG capsule Take 1 capsule (500 mg total) by mouth 4 (four) times daily. Apr 11, 2018  Yes Daiva Eves, Lisette Grinder, MD  divalproex (DEPAKOTE SPRINKLE) 125 MG capsule Take 125 mg by mouth at bedtime.   Yes  [provider]  docusate sodium (COLACE) 100 MG capsule Take 100 mg by mouth 2 (two) times daily.    Yes [provider]  escitalopram (LEXAPRO) 10 MG tablet Take 10 mg by mouth daily.  10/17/17  Yes [provider]  ferrous sulfate 325 (65 FE) MG tablet Take 325 mg by mouth 2 (two) times daily with a meal.    Yes [provider]  finasteride (PROSCAR) 5 MG tablet Take 5 mg by mouth at bedtime.    Yes [provider]  guaiFENesin (MUCINEX) 600 MG 12 hr tablet Take 600 mg by mouth 2 (two) times daily.   Yes [provider]  lactulose (CHRONULAC) 10 GM/15ML solution Take 10-30 g by mouth See admin instructions. Take 45 mls (30 g) by mouth every morning, take 15 mls (10 g) at bedtime. Hold for loose stool.   Yes [provider]  levofloxacin (LEVAQUIN) 500 MG tablet Take 750 mg by mouth 2 (two) times daily. 03/21/18 03/28/18 Yes [provider]  levothyroxine (SYNTHROID, LEVOTHROID) 88 MCG tablet Take 88 mcg by mouth daily before breakfast.   Yes [provider]  Lidocaine (ASPERCREME LIDOCAINE) 4 % PTCH Apply 3 patches topically See admin instructions. Apply one patch to left arm between shoulder and elbow every morning, remove every night (12 hrs later); apply 2nd patch between shoulder blades every morning, remove every night (12 hrs later); apply 3rd patch to right arm between shoulder and elbow every morning, remover every night (12 hrs later)   Yes [provider]  lovastatin (MEVACOR) 40 MG tablet Take 40 mg by mouth at bedtime.  07/09/14  Yes [provider]  magnesium hydroxide (MILK OF MAGNESIA) 400 MG/5ML suspension Take 30 mLs by mouth daily as needed for mild constipation.   Yes [provider]  metolazone (ZAROXOLYN) 5 MG tablet Take 5 mg by mouth daily. 2018/04/11 03/23/18 Yes [provider]  nafcillin 2 g in dextrose 5 % 100 mL Inject 2 g into the vein daily. Infuse 12 gms in  normal saline and infuse over continuous 24 hr period 03/10/18 03/23/18 Yes [provider]  nystatin (NYSTATIN) powder Apply topically See admin instructions. Apply powder to skin fold rash twice daily until resolved   Yes [provider]  phenol (CHLORASEPTIC) 1.4 % LIQD Use as directed 5 sprays in the mouth or throat every 4 (four) hours as needed (cough).   Yes [provider]  pioglitazone (ACTOS) 15 MG tablet Take 15 mg by mouth daily.   Yes [provider]  pregabalin (LYRICA) 75 MG capsule Take 1 capsule (75 mg total) by mouth daily.  Patient taking differently: Take 75 mg by mouth at bedtime.  02/08/18  Yes Tyrone Nine, MD  rivaroxaban (XARELTO) 20 MG TABS tablet Take 20 mg by mouth daily.   Yes [provider]  rOPINIRole (REQUIP) 0.5 MG tablet Take 0.5 mg by mouth at bedtime.  10/18/17  Yes [provider]  telmisartan (MICARDIS) 40 MG tablet Take 40 mg by mouth daily.   Yes [provider]  terazosin (HYTRIN) 5 MG capsule Take 5 mg by mouth at bedtime.  11/15/12  Yes [provider]  tiZANidine (ZANAFLEX) 2 MG tablet Take 2 mg by mouth 3 (three) times daily.  12/03/17  Yes [provider]  torsemide (DEMADEX) 10 MG tablet Take 20 mg by mouth daily.    Yes [provider]  vitamin C (ASCORBIC ACID) 500 MG tablet Take 500 mg by mouth 2 (two) times daily.   Yes [provider]    Physical Exam: Vitals:   2018/04/15 1353 04-15-2018 1358 04/15/18 1513 2018-04-15 1620  BP: 136/75 136/75    Pulse: 93 (!) 107    Resp: 18 (!) 25    Temp:   99.8 F (37.7 C)   TempSrc:   Rectal   SpO2: (!) 86% (!) 89%  96%    Constitutional: No distress Vitals:   2018-04-15 1353 2018/04/15 1358 04/15/18 1513 15-Apr-2018 1620  BP: 136/75 136/75    Pulse: 93 (!) 107    Resp: 18 (!) 25    Temp:   99.8 F (37.7 C)   TempSrc:   Rectal   SpO2: (!) 86% (!) 89%  96%   Eyes: Anicteric sclera ENMT: Edentulous, moist mucous  membranes Neck: [place]wick[place] Respiratory: Very diminished anterior lung sounds, mildly increased work of breathing, no wheezes, crackles, rhonchi.  Cardiovascular: Distant heart sounds, irregularly irregular, no murmurs.  Abdomen: Soft, nontender, no rebound or guarding, plus bowel sounds.  Musculoskeletal: 3+ lower extremity edema, Skin: No acute rash, PEG site in right upper extremity removed clean dry and intact Neurologic: Diffuse bilateral lower extremity weakness, sensation in lower extremities intact, hyperreflexic in lower extremities, bilateral upper extremity strength is 5 out of 5, cranial cranial nerves grossly intact Psychiatric: Normal judgment and insight. Alert and oriented x 3. Normal mood.     Labs on Admission: I have personally reviewed following labs and imaging studies  CBC: Recent Labs  Lab 2018-04-15 1425  WBC 7.0  NEUTROABS 6.2  HGB 11.4*  HCT 37.3*  MCV 101.4*  PLT 180   Basic Metabolic Panel: Recent Labs  Lab 04/15/2018 1425  NA 147*  K 2.8*  CL 107  CO2 28  GLUCOSE 102*  BUN 10  CREATININE 1.16  CALCIUM 8.3*   GFR: CrCl cannot be calculated (Unknown ideal weight.). Liver Function Tests: Recent Labs  Lab 04/15/18 1425  AST 20  ALT 9  ALKPHOS 50  BILITOT 2.1*  PROT 5.9*  ALBUMIN 2.4*   No results for input(s): LIPASE, AMYLASE in the last 168 hours. No results for input(s): AMMONIA in the last 168 hours. Coagulation Profile: No results for input(s): INR, PROTIME in the last 168 hours. Cardiac Enzymes: Recent Labs  Lab 04/15/2018 1425  TROPONINI 0.05*   BNP (last 3 results) No results for input(s): PROBNP in the last 8760 hours. HbA1C: No results for input(s): HGBA1C in the last 72 hours. CBG: No results for input(s): GLUCAP in the last 168 hours. Lipid Profile: No results for input(s): CHOL, HDL, LDLCALC, TRIG, CHOLHDL, LDLDIRECT in  the last 72 hours. Thyroid Function Tests: No results for input(s): TSH, T4TOTAL, FREET4, T3FREE,  THYROIDAB in the last 72 hours. Anemia Panel: No results for input(s): VITAMINB12, FOLATE, FERRITIN, TIBC, IRON, RETICCTPCT in the last 72 hours. Urine analysis:    Component Value Date/Time   COLORURINE YELLOW 03/06/2018 1040   APPEARANCEUR CLEAR 03/06/2018 1040   LABSPEC 1.014 03/06/2018 1040   PHURINE 7.0 03/06/2018 1040   GLUCOSEU NEGATIVE 03/06/2018 1040   HGBUR NEGATIVE 03/06/2018 1040   BILIRUBINUR NEGATIVE 03/06/2018 1040   KETONESUR NEGATIVE 03/06/2018 1040   PROTEINUR NEGATIVE 03/06/2018 1040   UROBILINOGEN 0.2 12/30/2012 2015   NITRITE NEGATIVE 03/06/2018 1040   LEUKOCYTESUR NEGATIVE 03/06/2018 1040    Radiological Exams on Admission: Dg Chest Port 1 View  Result Date: 03/10/2018 CLINICAL DATA:  Shortness of breath. EXAM: PORTABLE CHEST 1 VIEW COMPARISON:  Radiograph of March 06, 2018. FINDINGS: Stable cardiomegaly. Atherosclerosis of thoracic aorta is noted. No pneumothorax is noted. Right lung is clear. Left retrocardiac opacity can not be excluded. Bony thorax is unremarkable. IMPRESSION: Stable cardiomegaly. Right lung is clear. Left retrocardiac opacity can not be excluded. Aortic Atherosclerosis (ICD10-I70.0). Electronically Signed   By: Lupita Raider, M.D.   On: 03/02/2018 14:31    EKG: Independently reviewed.  Low voltage, irregularly irregular, atrial fibrillation  Assessment/Plan Active Problems:   Hip dislocation, right (HCC)   Essential hypertension   Lower extremity weakness   Chronic kidney disease, stage III (moderate) (HCC)   Atrial fibrillation (HCC)   Hypokalemia   DM type 2 (diabetes mellitus, type 2) (HCC)   Staphylococcus aureus bacteremia   MSSA (methicillin susceptible Staphylococcus aureus) infection   Osteomyelitis of thoracic spine (HCC)   Other chronic pain   Major depression   Epidural abscess   Acute respiratory failure with hypoxia (HCC)    #) Acute hypoxic respiratory failure: Currently the most likely etiology of his acute  hypoxic respiratory failure seems more consistent with an infectious process rather than acute on chronic diastolic dysfunction.  He has been on diuretics and while he certainly does have some lower extremity edema his chest x-ray does not show any evidence of pulmonary edema.  Additionally he is both hypernatremic and hypokalemic suggesting that he has been getting his diuretics at his facility.  This would not be consistent with fluid overload.  His BNP is difficult to go by due to his significant obesity.  Additionally it is if anything lower than prior. - Continue IV ceftriaxone 2 g every 12 as this will cover both his MSSA that his apparently thought to possibly got into his spine as well as his hip -We will order echo -Follow blood cultures ordered 03/05/2018 - We will order procalcitonin -Sputum culture -We will check influenza and respiratory viral PCR  #) Chronic diastolic heart failure: Patient's volume status is challenging at this time but while he certainly might have edema it is not clear that his hypoxia is related to fluid overload as he is both hyponatremic and hypokalemic. -Hold home oral diuretics and metolazone -We will avoid IV fluids -Repeat echo  #) Recurrent MSSA infection in right hip, T-spine: -Ceftriaxone per above  #) Chronic atrial fibrillation: Patient has a history of bradycardia status post permanent pacemaker which was removed in 2017 due to recurrent MSSA bacteremia. -Continue rivaroxaban 20 mg daily -Avoid beta-blockers due to episodes of bradycardia  #) Macrocytic anemia: Mild -We will hold on work-up at this time  #) Hypothyroidism: -Continue levothyroxine 88 mcg  daily  #) Hypertension/hyperlipidemia: -Hold telmisartan 40 mg daily -Continue lovastatin 40 mg nightly  #) Type 2 diabetes: -Hold p.o. glitazone 15 mg daily -Sliding scale insulin, AC at bedtime  #) BPH: -Continue terazosin 5 mg nightly -Continue finasteride 5 mg daily  #)  Pain/psych: -Continue escitalopram 10 mg daily -Continue pregabalin 75 mg nightly -Continue ropinirole 0.5 mg nightly - Continue PRN tizanidine -Continue bupropion on milligrams twice daily -Continue divalproex 125 mg nightly  Fluids: Tolerating p.o. Electrolytes: Monitor and supplement Nutrition: Heart healthy carb restricted diet  Prophylaxis: On rivaroxaban  Disposition: Pending resolution of acute hypoxic respiratory failure  DO NOT RESUSCITATE, palliative care is following patient at skilled nursing facility    Delaine LameShrey C Donterius Filley MD [facility]  If 7PM-7AM, please contact night-coverage [email or web address] Password [email or web address]  03/03/2018, 4:39 PM

## 2018-03-22 NOTE — Progress Notes (Signed)
RT NOTES: Removed patient from bipap per MD. Placed on 5lpm nasal cannula.

## 2018-03-22 NOTE — Progress Notes (Signed)
Per verbal order from Dr Daiva Eves, 39 cm Single Lumen Peripherally Inserted Central Catheter removed from right basilic, tip intact. No sutures present. RN confirmed length per chart. Dressing was clean and dry. Petroleum dressing applied. Pt advised no heavy lifting with this arm, leave dressing for 24 hours and call the office or seek emergent care if dressing becomes soaked with blood or sharp pain presents. Patient and his daughter verbalized understanding and agreement.  Patient's questions answered to their satisfaction. Patient tolerated procedure well, RN walked patient to check out. Andree Coss, RN

## 2018-03-22 NOTE — ED Notes (Signed)
Hospitalist at bedside 

## 2018-03-22 NOTE — ED Provider Notes (Signed)
[name] EMERGENCY DEPARTMENT Provider Note   CSN: [id number] Arrival date & time: 03/21/2018  1348    History   Chief Complaint Chief Complaint  Patient presents with  . Respiratory Distress    HPI [name] is a 85 y.o. male.     Patient is a 85 year old male who presents from [facility].  He has a history of CHF, A. fib, chronic kidney disease, diabetes and hypertension.  He has been treated recently for MSSA bacteremia.  He has been treated twice for infection of a prosthetic hip and recently was treated for discitis of his T-spine.  He has been on IV Ancef which is been switched to oral Keflex.  He was seen by his infectious disease doctor today and his PICC line was DC'd.  Once back at the nursing home, he has had worsening shortness of breath.  He was noted to be hypoxic on EMS arrival with oxygen saturations in the mid 80s.  He was treated with Solu-Medrol and IM epi.  He was also started on CPAP.  He says that his breathing has improved on the CPAP.  He denies any chest pain.  He states his cough is nonproductive.  He does have some increased leg swelling.  No vomiting or diarrhea.     Past Medical History:  Diagnosis Date  . Alzheimer's type dementia (HCC) 12/07/2016  . Anemia   . Anxiety   . Arthritis   . Bacteremia   . Bradycardia    a. PPM removed in 2014 due to bacteremia.  . CHF exacerbation (HCC) 03/16/2018  . Chronic atrial fibrillation   . Chronic right shoulder pain 06/17/2015  . CKD (chronic kidney disease), stage III (HCC)   . Complication of anesthesia    " Terrible Nightmares"  . Constipation   . Depression   . Diabetes mellitus without complication (HCC)    Type II  . DVT (deep venous thrombosis) (HCC)    a. in 2019 - pt reports behind knee - was on Xarelto  BID now  daily.  . Encounter for removal of cardiac resynchronization therapy pacemaker 2017  . Grief 06/17/2015  . History of blood product transfusion   .  Hypertension   . Hypothyroidism   . Infected pacemaker (HCC)   . Major depression 06/17/2015  . MSSA (methicillin susceptible Staphylococcus aureus) infection   . OAB (overactive bladder)   . Overactive bladder   . Septic hip (HCC)   . Septicemia (HCC)   . Severe depression (HCC) 12/07/2016  . Staphylococcus aureus infection   . Thoracic spine pain 11/28/2014    Patient Active Problem List   Diagnosis Date Noted  . CHF exacerbation (HCC) 03/24/2018  . Goals of care, counseling/discussion   . Palliative care encounter   . Spinal epidural abscess 03/06/2018  . Pressure injury of skin 02/05/2018  . 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 Hypotension 01/30/2018  . Syncope 01/30/2018  . Facial droop 11/04/2017  . Left arm weakness 11/04/2017  . Symptomatic bradycardia 11/03/2017  . Bradycardia   . Altered mental status   . Bilateral leg weakness 06/27/2017  . Depression 12/07/2016  . Alzheimer's type dementia (HCC) 12/07/2016  . Anxiety 01/11/2016  . Other chronic pain 06/17/2015  . Grief 06/17/2015  . Major depression 06/17/2015  . Type 2 diabetes mellitus with complication (HCC)   . Discitis 01/10/2015  . Osteomyelitis of thoracic spine (HCC) 01/10/2015  . Thoracic spine pain 11/28/2014  . Elevated PSA 11/08/2014  .  Prosthetic hip infection (HCC) 02/16/2013  . MSSA (methicillin susceptible Staphylococcus aureus) infection   . Infected pacemaker (HCC)   . Closed dislocation of right hip (HCC) 10/02/2012    Class: Acute  . Retroperitoneal fluid collection 08/28/2012  . Staphylococcus aureus bacteremia 08/27/2012  . Hypokalemia 08/26/2012  . DM type 2 (diabetes mellitus, type 2) (HCC) 08/26/2012  . Thrombocytopenia (HCC) 03/21/2012  . Hypothyroidism 02/14/2012  . Hyperlipidemia LDL goal <100 02/14/2012  . Benign prostatic hyperplasia 02/14/2012  . Lower extremity weakness 02/11/2012  . Anemia in chronic kidney disease 02/11/2012  . Chronic kidney disease, stage III (moderate)  (HCC) 02/11/2012  . Atrial fibrillation (HCC) 02/11/2012  . Spinal stenosis 02/11/2012  . Hip dislocation, right (HCC) 11/30/2010  . Essential hypertension 11/30/2010    Past Surgical History:  Procedure Laterality Date  . COLONOSCOPY W/ POLYPECTOMY     small  . EYE SURGERY     bilateral  . HIP CLOSED REDUCTION Right 10/02/2012   Procedure: CLOSED REDUCTION HIP;  Surgeon: Velna Ochs, MD;  Location: [facility] OR;  Service: Orthopedics;  Laterality: Right;  . ICD LEAD REMOVAL Left 08/31/2012   Procedure: ICD LEAD REMOVAL;  Surgeon: Marinus Maw, MD;  Location: [facility] OR;  Service: Cardiovascular;  Laterality: Left;  . INCISION AND DRAINAGE HIP Right 09/02/2012   Procedure: IRRIGATION AND DEBRIDEMENT RIGHT HIP;  Surgeon: Nestor Lewandowsky, MD;  Location: [facility] OR;  Service: Orthopedics;  Laterality: Right;  . INCISION AND DRAINAGE HIP Right 08/10/2014   Procedure: IRRIGATION AND DEBRIDEMENT RIGHT HIP SUPERFICIAL VS DEEP;  Surgeon: Gean Birchwood, MD;  Location: [facility] OR;  Service: Orthopedics;  Laterality: Right;  . INSERT / REPLACE / REMOVE PACEMAKER     removed in 2017 d/t sepsis/notes 11/03/2017  . IR FLUORO GUIDED NEEDLE PLC ASPIRATION/INJECTION LOC  02/04/2018  . IR FLUORO GUIDED NEEDLE PLC ASPIRATION/INJECTION LOC  02/04/2018  . JOINT REPLACEMENT    . LUMBAR LAMINECTOMY/DECOMPRESSION MICRODISCECTOMY  02/14/2012   Procedure: LUMBAR LAMINECTOMY/DECOMPRESSION MICRODISCECTOMY 1 LEVEL;  Surgeon: Tia Alert, MD;  Location: [facility] NEURO ORS;  Service: Neurosurgery;  Laterality: N/A;  Thoracic twelve - Lumbar one decompressive laminectomy.  Marland Kitchen RADIOLOGY WITH ANESTHESIA N/A 01/10/2015   Procedure: MRI LUMBAR SPINE WITH/WITHOUT   (RADIOLOGY WITH ANESTHESIA);  Surgeon: Medication Radiologist, MD;  Location: [facility] OR;  Service: Radiology;  Laterality: N/A;  . RADIOLOGY WITH ANESTHESIA N/A 02/04/2018   Procedure: Spinal biopsy;  Surgeon: Julieanne Cotton, MD;  Location: [facility] OR;  Service: Radiology;  Laterality: N/A;  . TEE  WITHOUT CARDIOVERSION N/A 08/30/2012   Procedure: TRANSESOPHAGEAL ECHOCARDIOGRAM (TEE);  Surgeon: Laurey Morale, MD;  Location: [facility] ENDOSCOPY;  Service: Cardiovascular;  Laterality: N/A;  . TONSILLECTOMY    . TOTAL HIP REVISION Right 09/02/2012   Procedure: REVISION OF POLYETHYLENE LINER AND FEMORAL HEAD ;  Surgeon: Nestor Lewandowsky, MD;  Location: [facility] OR;  Service: Orthopedics;  Laterality: Right;  . TOTAL HIP REVISION Right 11/28/2012   Procedure: TOTAL HIP REVISION With Placement of Contrain Liner;  Surgeon: Nestor Lewandowsky, MD;  Location: [facility] OR;  Service: Orthopedics;  Laterality: Right;        Home Medications    Prior to Admission medications   Medication Sig Start Date End Date Taking? Authorizing Provider  acetaminophen (TYLENOL) 500 MG tablet Take 500 mg by mouth 3 (three) times daily.   Yes [provider]  Artificial Saliva (BIOTENE MOISTURIZING MOUTH MT) Use as directed 2 sprays in the mouth or throat every  6 (six) hours as needed (Dry mouth). Apply 2 sprays directly onto mouth and tongue. Spread thoroughly inside the mouth - every 6 hours as needed for dry mouth.    Yes [provider]  buPROPion (WELLBUTRIN SR) 100 MG 12 hr tablet Take 100 mg by mouth 2 (two) times daily.   Yes [provider]  cephALEXin (KEFLEX) 500 MG capsule Take 1 capsule (500 mg total) by mouth 4 (four) times daily. 2018-04-04  Yes Daiva Eves, Lisette Grinder, MD  divalproex (DEPAKOTE SPRINKLE) 125 MG capsule Take 125 mg by mouth at bedtime.   Yes [provider]  docusate sodium (COLACE) 100 MG capsule Take 100 mg by mouth 2 (two) times daily.    Yes [provider]  escitalopram (LEXAPRO) 10 MG tablet Take 10 mg by mouth daily.  10/17/17  Yes [provider]  ferrous sulfate 325 (65 FE) MG tablet Take 325 mg by mouth 2 (two) times daily with a meal.    Yes [provider]  finasteride (PROSCAR) 5 MG tablet Take 5 mg by mouth at bedtime.    Yes [provider]  guaiFENesin (MUCINEX) 600 MG 12 hr tablet Take 600 mg by mouth 2 (two) times daily.   Yes [provider]  lactulose (CHRONULAC) 10 GM/15ML solution Take 10-30 g by mouth See admin instructions. Take 45 mls (30 g) by mouth every morning, take 15 mls (10 g) at bedtime. Hold for loose stool.   Yes [provider]  levofloxacin (LEVAQUIN) 500 MG tablet Take 750 mg by mouth 2 (two) times daily. 03/21/18 03/28/18 Yes [provider]  levothyroxine (SYNTHROID, LEVOTHROID) 88 MCG tablet Take 88 mcg by mouth daily before breakfast.   Yes [provider]  Lidocaine (ASPERCREME LIDOCAINE) 4 % PTCH Apply 3 patches topically See admin instructions. Apply one patch to left arm between shoulder and elbow every morning, remove every night (12 hrs later); apply 2nd patch between shoulder blades every morning, remove every night (12 hrs later); apply 3rd patch to right arm between shoulder and elbow every morning, remover every night (12 hrs later)   Yes [provider]  lovastatin (MEVACOR) 40 MG tablet Take 40 mg by mouth at bedtime.  07/09/14  Yes [provider]  magnesium hydroxide (MILK OF MAGNESIA) 400 MG/5ML suspension Take 30 mLs by mouth daily as needed for mild constipation.   Yes [provider]  metolazone (ZAROXOLYN) 5 MG tablet Take 5 mg by mouth daily. 04-04-18 03/23/18 Yes [provider]  nafcillin 2 g in dextrose 5 % 100 mL Inject 2 g into the vein daily. Infuse 12 gms in normal saline and infuse over continuous 24 hr period 03/10/18 03/23/18 Yes [provider]  nystatin (NYSTATIN) powder Apply topically See admin instructions. Apply powder to skin fold rash twice daily until resolved   Yes [provider]  phenol (CHLORASEPTIC) 1.4 % LIQD Use as directed 5 sprays in the mouth or throat every 4 (four) hours as needed (cough).   Yes [provider]  pioglitazone (ACTOS) 15 MG tablet Take 15  mg by mouth daily.   Yes [provider]  pregabalin (LYRICA) 75 MG capsule Take 1 capsule (75 mg total) by mouth daily. Patient taking differently: Take 75 mg by mouth at bedtime.  02/08/18  Yes Tyrone Nine, MD  rivaroxaban (XARELTO) 20 MG TABS tablet Take 20 mg by mouth daily.   Yes [provider]  rOPINIRole (REQUIP)  0.5 MG tablet Take 0.5 mg by mouth at bedtime.  10/18/17  Yes [provider]  telmisartan (MICARDIS) 40 MG tablet Take 40 mg by mouth daily.   Yes [provider]  terazosin (HYTRIN) 5 MG capsule Take 5 mg by mouth at bedtime.  11/15/12  Yes [provider]  tiZANidine (ZANAFLEX) 2 MG tablet Take 2 mg by mouth 3 (three) times daily.  12/03/17  Yes [provider]  torsemide (DEMADEX) 10 MG tablet Take 20 mg by mouth daily.    Yes [provider]  vitamin C (ASCORBIC ACID) 500 MG tablet Take 500 mg by mouth 2 (two) times daily.   Yes [provider]    Family History Family History  Problem Relation Age of Onset  . Heart failure Mother     Social History Social History   Tobacco Use  . Smoking status: Never Smoker  . Smokeless tobacco: Never Used  Substance Use Topics  . Alcohol use: No    Alcohol/week: 0.0 standard drinks  . Drug use: No     Allergies   Ace inhibitors; Gabapentin; Nsaids; and Zolpidem   Review of Systems Review of Systems  Constitutional: Positive for fatigue. Negative for chills, diaphoresis and fever.  HENT: Negative for congestion, rhinorrhea and sneezing.   Eyes: Negative.   Respiratory: Positive for cough and shortness of breath. Negative for chest tightness.   Cardiovascular: Positive for leg swelling. Negative for chest pain.  Gastrointestinal: Negative for abdominal pain, blood in stool, diarrhea, nausea and vomiting.  Genitourinary: Negative for difficulty urinating, flank pain, frequency and hematuria.  Musculoskeletal: Negative for arthralgias and back pain.   Skin: Negative for rash.  Neurological: Negative for dizziness, speech difficulty, weakness, numbness and headaches.     Physical Exam Updated Vital Signs BP 136/75   Pulse (!) 107   Temp 99.8 F (37.7 C) (Rectal)   Resp (!) 25   SpO2 (!) 89%   Physical Exam Constitutional:      Appearance: He is well-developed.     Comments: Obese  HENT:     Head: Normocephalic and atraumatic.  Eyes:     Pupils: Pupils are equal, round, and reactive to light.  Neck:     Musculoskeletal: Normal range of motion and neck supple.  Cardiovascular:     Rate and Rhythm: Normal rate and regular rhythm.     Heart sounds: Normal heart sounds.  Pulmonary:     Effort: Respiratory distress present.     Breath sounds: Rhonchi and rales present. No wheezing.     Comments: He has rhonchi and rales bilaterally.  He has some increased work of breathing but is able to talk in short sentences. Chest:     Chest wall: No tenderness.  Abdominal:     General: Bowel sounds are normal.     Palpations: Abdomen is soft.     Tenderness: There is no abdominal tenderness. There is no guarding or rebound.  Musculoskeletal: Normal range of motion.     Right lower leg: Edema present.     Left lower leg: Edema present.  Lymphadenopathy:     Cervical: No cervical adenopathy.  Skin:    General: Skin is warm and dry.     Findings: No rash.  Neurological:     Mental Status: He is alert and oriented to person, place, and time.      ED Treatments / Results  Labs (all labs ordered are listed, but only abnormal results are  displayed) Labs Reviewed  COMPREHENSIVE METABOLIC PANEL - Abnormal; Notable for the following components:      Result Value   Sodium 147 (*)    Potassium 2.8 (*)    Glucose, Bld 102 (*)    Calcium 8.3 (*)    Total Protein 5.9 (*)    Albumin 2.4 (*)    Total Bilirubin 2.1 (*)    GFR calc non Af Amer 57 (*)    All other components within normal limits  BRAIN NATRIURETIC PEPTIDE - Abnormal;  Notable for the following components:   B Natriuretic Peptide 179.4 (*)    All other components within normal limits  TROPONIN I - Abnormal; Notable for the following components:   Troponin I 0.05 (*)    All other components within normal limits  CBC WITH DIFFERENTIAL/PLATELET - Abnormal; Notable for the following components:   RBC 3.68 (*)    Hemoglobin 11.4 (*)    HCT 37.3 (*)    MCV 101.4 (*)    RDW 16.2 (*)    Lymphs Abs 0.1 (*)    All other components within normal limits  LACTIC ACID, PLASMA - Abnormal; Notable for the following components:   Lactic Acid, Venous 2.2 (*)    All other components within normal limits  CULTURE, BLOOD (ROUTINE X 2)  CULTURE, BLOOD (ROUTINE X 2)  LACTIC ACID, PLASMA    EKG EKG Interpretation  Date/Time:  Tuesday March 22 2018 13:53:53 EST Ventricular Rate:  94 PR Interval:    QRS Duration: 81 QT Interval:  427 QTC Calculation: 534 R Axis:   4 Text Interpretation:  Atrial fibrillation Ventricular premature complex Anterior infarct, old Nonspecific T abnormalities, lateral leads Prolonged QT interval since last tracing no significant change Confirmed by Rolan Bucco ([id number]) on 03/06/2018 2:21:47 PM   Radiology Dg Chest Port 1 View  Result Date: 03/20/2018 CLINICAL DATA:  Shortness of breath. EXAM: PORTABLE CHEST 1 VIEW COMPARISON:  Radiograph of March 06, 2018. FINDINGS: Stable cardiomegaly. Atherosclerosis of thoracic aorta is noted. No pneumothorax is noted. Right lung is clear. Left retrocardiac opacity can not be excluded. Bony thorax is unremarkable. IMPRESSION: Stable cardiomegaly. Right lung is clear. Left retrocardiac opacity can not be excluded. Aortic Atherosclerosis (ICD10-I70.0). Electronically Signed   By: Lupita Raider, M.D.   On: 03/05/2018 14:31    Procedures Procedures (including critical care time)  Medications Ordered in ED Medications  potassium chloride 10 mEq in 100 mL IVPB (has no administration in time  range)  furosemide (LASIX) injection 40 mg (has no administration in time range)     Initial Impression / Assessment and Plan / ED Course  I have reviewed the triage vital signs and the nursing notes.  Pertinent labs & imaging results that were available during my care of the patient were reviewed by me and considered in my medical decision making (see chart for details).        Patient is a 85 year old male who has had a recent complicated history involving MSSA, most recently discitis.  He is a paraplegic as a result of this.  He has had worsening shortness of breath and respiratory failure over the last few days per his wife.  He recently over the last 2 3 days has had to be on supplemental oxygen.  He has been getting IV antibiotics and it was felt that he may be fluid overloaded related this.  His infectious disease doctor switched him to oral antibiotics today.  Chest x-ray does  not show definitive pneumonia.  His BNP is mildly elevated.  He does not have ischemic changes noted on EKG. his troponin is mildly elevated but was also elevated on his prior visits.  This is likely more chronic/demand ischemia.  He has a normal rectal temp of 99.8.  His white count is normal.  His lactate is minimally elevated.  However given his recurrent recent infections without clear evidence of pneumonia, I will hold off on antibiotics at this point.  Feel his shortness of breath is more likely related to fluid overload.  He was given Lasix in the ED.  I will consult the hospitalist for admission.   I spoke with Dr. Clearnce Sorrel who will admit the pt.  He requested CT angio chest which I did order and is pending.  CRITICAL CARE Performed by: Rolan Bucco Total critical care time: 60 minutes Critical care time was exclusive of separately billable procedures and treating other patients. Critical care was necessary to treat or prevent imminent or life-threatening deterioration. Critical care was time spent  personally by me on the following activities: development of treatment plan with patient and/or surrogate as well as nursing, discussions with consultants, evaluation of patient's response to treatment, examination of patient, obtaining history from patient or surrogate, ordering and performing treatments and interventions, ordering and review of laboratory studies, ordering and review of radiographic studies, pulse oximetry and re-evaluation of patient's condition.   Final Clinical Impressions(s) / ED Diagnoses   Final diagnoses:  Respiratory distress    ED Discharge Orders    None       Rolan Bucco, MD 03/31/18 1620

## 2018-03-22 NOTE — ED Notes (Signed)
RN informed Pt can receive visitor  

## 2018-03-23 ENCOUNTER — Inpatient Hospital Stay (HOSPITAL_COMMUNITY): Payer: Medicare Other

## 2018-03-23 DIAGNOSIS — Z7189 Other specified counseling: Secondary | ICD-10-CM

## 2018-03-23 DIAGNOSIS — A4901 Methicillin susceptible Staphylococcus aureus infection, unspecified site: Secondary | ICD-10-CM

## 2018-03-23 DIAGNOSIS — N183 Chronic kidney disease, stage 3 (moderate): Secondary | ICD-10-CM

## 2018-03-23 DIAGNOSIS — I10 Essential (primary) hypertension: Secondary | ICD-10-CM

## 2018-03-23 DIAGNOSIS — E1101 Type 2 diabetes mellitus with hyperosmolarity with coma: Secondary | ICD-10-CM

## 2018-03-23 DIAGNOSIS — J9601 Acute respiratory failure with hypoxia: Secondary | ICD-10-CM

## 2018-03-23 DIAGNOSIS — R7881 Bacteremia: Secondary | ICD-10-CM

## 2018-03-23 DIAGNOSIS — E876 Hypokalemia: Secondary | ICD-10-CM

## 2018-03-23 DIAGNOSIS — Z515 Encounter for palliative care: Secondary | ICD-10-CM

## 2018-03-23 DIAGNOSIS — R29898 Other symptoms and signs involving the musculoskeletal system: Secondary | ICD-10-CM

## 2018-03-23 DIAGNOSIS — M4624 Osteomyelitis of vertebra, thoracic region: Secondary | ICD-10-CM

## 2018-03-23 LAB — ECHOCARDIOGRAM COMPLETE
Height: 71 in
Weight: 4285.74 oz

## 2018-03-23 LAB — CBC
HCT: 34.9 % — ABNORMAL LOW (ref 39.0–52.0)
Hemoglobin: 11.1 g/dL — ABNORMAL LOW (ref 13.0–17.0)
MCH: 30.9 pg (ref 26.0–34.0)
MCHC: 31.8 g/dL (ref 30.0–36.0)
MCV: 97.2 fL (ref 80.0–100.0)
Platelets: 169 10*3/uL (ref 150–400)
RBC: 3.59 MIL/uL — ABNORMAL LOW (ref 4.22–5.81)
RDW: 16.2 % — ABNORMAL HIGH (ref 11.5–15.5)
WBC: 3.9 10*3/uL — ABNORMAL LOW (ref 4.0–10.5)
nRBC: 0 % (ref 0.0–0.2)

## 2018-03-23 LAB — BASIC METABOLIC PANEL
Anion gap: 12 (ref 5–15)
BUN: 11 mg/dL (ref 8–23)
CO2: 29 mmol/L (ref 22–32)
Calcium: 8.5 mg/dL — ABNORMAL LOW (ref 8.9–10.3)
Chloride: 105 mmol/L (ref 98–111)
Creatinine, Ser: 1.17 mg/dL (ref 0.61–1.24)
GFR calc Af Amer: >60 mL/min (ref >60)
GFR calc non Af Amer: 57 mL/min — ABNORMAL LOW (ref >60)
Sodium: 146 mmol/L — ABNORMAL HIGH (ref 135–145)

## 2018-03-23 LAB — RESPIRATORY PANEL BY PCR

## 2018-03-23 LAB — GLUCOSE, CAPILLARY
Glucose-Capillary: 112 mg/dL — ABNORMAL HIGH (ref 70–99)
Glucose-Capillary: 87 mg/dL (ref 70–99)
Glucose-Capillary: 81 mg/dL (ref 70–99)
Glucose-Capillary: 58 mg/dL — ABNORMAL LOW (ref 70–99)
Glucose-Capillary: 64 mg/dL — ABNORMAL LOW (ref 70–99)

## 2018-03-23 LAB — PROCALCITONIN: Procalcitonin: 0.35 ng/mL

## 2018-03-23 LAB — BASIC METABOLIC PANEL WITH GFR
Glucose, Bld: 101 mg/dL — ABNORMAL HIGH (ref 70–99)
Potassium: 2.8 mmol/L — ABNORMAL LOW (ref 3.5–5.1)

## 2018-03-23 MED ORDER — FERROUS SULFATE 325 (65 FE) MG PO TABS
325.0000 mg | ORAL_TABLET | Freq: Two times a day (BID) | ORAL | Status: DC
  Filled 2018-03-23: qty 1

## 2018-03-23 MED ORDER — PREGABALIN 75 MG PO CAPS
75.0000 mg | ORAL_CAPSULE | Freq: Every day | ORAL | Status: DC
  Administered 2018-03-23: 75 mg via ORAL
  Filled 2018-03-23: qty 1

## 2018-03-23 MED ORDER — ESCITALOPRAM OXALATE 10 MG PO TABS
10.0000 mg | ORAL_TABLET | Freq: Every day | ORAL | Status: DC
  Filled 2018-03-23: qty 1

## 2018-03-23 MED ORDER — MORPHINE SULFATE (PF) 2 MG/ML IV SOLN
2.0000 mg | INTRAVENOUS | Status: DC
  Administered 2018-03-23 – 2018-03-24 (×5): 2 mg via INTRAVENOUS
  Filled 2018-03-23 (×5): qty 1

## 2018-03-23 MED ORDER — OXYCODONE HCL 5 MG PO TABS: 5.0000 mg | ORAL_TABLET | ORAL | Status: DC | PRN

## 2018-03-23 MED ORDER — INSULIN ASPART 100 UNIT/ML ~~LOC~~ SOLN: 0.0000 [IU] | Freq: Three times a day (TID) | SUBCUTANEOUS | Status: DC

## 2018-03-23 MED ORDER — SODIUM CHLORIDE 0.9% FLUSH: 3.0000 mL | INTRAVENOUS | Status: DC | PRN

## 2018-03-23 MED ORDER — TIZANIDINE HCL 2 MG PO TABS
2.0000 mg | ORAL_TABLET | Freq: Three times a day (TID) | ORAL | Status: DC
  Filled 2018-03-23 (×2): qty 1

## 2018-03-23 MED ORDER — SODIUM CHLORIDE 0.9% FLUSH
3.0000 mL | Freq: Two times a day (BID) | INTRAVENOUS | Status: DC
  Administered 2018-03-23 (×2): 3 mL via INTRAVENOUS

## 2018-03-23 MED ORDER — FUROSEMIDE 10 MG/ML IJ SOLN: 80.0000 mg | Freq: Two times a day (BID) | INTRAMUSCULAR | Status: DC

## 2018-03-23 MED ORDER — GLYCOPYRROLATE 0.2 MG/ML IJ SOLN
0.4000 mg | Freq: Four times a day (QID) | INTRAMUSCULAR | Status: DC
  Administered 2018-03-23 – 2018-03-24 (×4): 0.4 mg via INTRAVENOUS
  Filled 2018-03-23 (×4): qty 2

## 2018-03-23 MED ORDER — ONDANSETRON HCL 4 MG/2ML IJ SOLN: 4.0000 mg | Freq: Four times a day (QID) | INTRAMUSCULAR | Status: DC | PRN

## 2018-03-23 MED ORDER — POTASSIUM CHLORIDE CRYS ER 20 MEQ PO TBCR
40.0000 meq | EXTENDED_RELEASE_TABLET | Freq: Once | ORAL | Status: AC
  Administered 2018-03-23: 40 meq via ORAL
  Filled 2018-03-23: qty 2

## 2018-03-23 MED ORDER — PRAVASTATIN SODIUM 40 MG PO TABS: 40.0000 mg | ORAL_TABLET | Freq: Every day | ORAL | Status: DC

## 2018-03-23 MED ORDER — POTASSIUM CHLORIDE 10 MEQ/100ML IV SOLN
10.0000 meq | INTRAVENOUS | Status: AC
  Administered 2018-03-23: 10 meq via INTRAVENOUS
  Filled 2018-03-23: qty 100

## 2018-03-23 MED ORDER — MAGNESIUM CITRATE PO SOLN: 1.0000 | Freq: Once | ORAL | Status: DC | PRN

## 2018-03-23 MED ORDER — SODIUM CHLORIDE 0.9 % IV SOLN
500.0000 mg | INTRAVENOUS | Status: DC
  Administered 2018-03-23 – 2018-03-24 (×2): 500 mg via INTRAVENOUS
  Filled 2018-03-23 (×2): qty 500

## 2018-03-23 MED ORDER — LACTULOSE 10 GM/15ML PO SOLN
30.0000 g | Freq: Every day | ORAL | Status: DC
  Filled 2018-03-23: qty 45

## 2018-03-23 MED ORDER — METOPROLOL TARTRATE 5 MG/5ML IV SOLN
5.0000 mg | Freq: Four times a day (QID) | INTRAVENOUS | Status: DC | PRN
  Administered 2018-03-23: 5 mg via INTRAVENOUS
  Filled 2018-03-23: qty 5

## 2018-03-23 MED ORDER — DOCUSATE SODIUM 100 MG PO CAPS
100.0000 mg | ORAL_CAPSULE | Freq: Two times a day (BID) | ORAL | Status: DC
  Filled 2018-03-23: qty 1

## 2018-03-23 MED ORDER — PHENOL 1.4 % MT LIQD: 5.0000 | OROMUCOSAL | Status: DC | PRN

## 2018-03-23 MED ORDER — MORPHINE SULFATE (PF) 2 MG/ML IV SOLN: 2.0000 mg | INTRAVENOUS | Status: DC | PRN

## 2018-03-23 MED ORDER — MORPHINE SULFATE (CONCENTRATE) 10 MG/0.5ML PO SOLN: 5.0000 mg | ORAL | Status: DC | PRN

## 2018-03-23 MED ORDER — TROLAMINE SALICYLATE 10 % EX CREA
TOPICAL_CREAM | Freq: Two times a day (BID) | CUTANEOUS | Status: DC
  Filled 2018-03-23: qty 85

## 2018-03-23 MED ORDER — TERAZOSIN HCL 5 MG PO CAPS
5.0000 mg | ORAL_CAPSULE | Freq: Every day | ORAL | Status: DC
  Administered 2018-03-23: 5 mg via ORAL
  Filled 2018-03-23: qty 1

## 2018-03-23 MED ORDER — MORPHINE SULFATE (CONCENTRATE) 10 MG/0.5ML PO SOLN
5.0000 mg | ORAL | Status: DC
  Administered 2018-03-24: 5 mg via SUBLINGUAL
  Filled 2018-03-23: qty 0.5

## 2018-03-23 MED ORDER — SODIUM CHLORIDE 0.9 % IV SOLN: 250.0000 mL | INTRAVENOUS | Status: DC | PRN

## 2018-03-23 MED ORDER — BUPROPION HCL ER (SR) 100 MG PO TB12
100.0000 mg | ORAL_TABLET | Freq: Two times a day (BID) | ORAL | Status: DC
  Filled 2018-03-23: qty 1

## 2018-03-23 MED ORDER — ONDANSETRON HCL 4 MG PO TABS: 4.0000 mg | ORAL_TABLET | Freq: Four times a day (QID) | ORAL | Status: DC | PRN

## 2018-03-23 MED ORDER — VITAMIN C 500 MG PO TABS
500.0000 mg | ORAL_TABLET | Freq: Two times a day (BID) | ORAL | Status: DC
  Filled 2018-03-23: qty 1

## 2018-03-23 MED ORDER — LEVOTHYROXINE SODIUM 88 MCG PO TABS
88.0000 ug | ORAL_TABLET | Freq: Every day | ORAL | Status: DC
  Filled 2018-03-23: qty 1

## 2018-03-23 MED ORDER — DIVALPROEX SODIUM 125 MG PO CSDR
125.0000 mg | DELAYED_RELEASE_CAPSULE | Freq: Every day | ORAL | Status: DC
  Administered 2018-03-23: 125 mg via ORAL
  Filled 2018-03-23: qty 1

## 2018-03-23 MED ORDER — NYSTATIN 100000 UNIT/GM EX POWD
Freq: Two times a day (BID) | CUTANEOUS | Status: DC | PRN
  Filled 2018-03-23: qty 15

## 2018-03-23 MED ORDER — MUSCLE RUB 10-15 % EX CREA
TOPICAL_CREAM | Freq: Two times a day (BID) | CUTANEOUS | Status: DC
  Administered 2018-03-24 – 2018-03-25 (×3): via TOPICAL
  Filled 2018-03-23: qty 85

## 2018-03-23 MED ORDER — ROPINIROLE HCL 0.5 MG PO TABS
0.5000 mg | ORAL_TABLET | Freq: Every day | ORAL | Status: DC
  Administered 2018-03-23: 0.5 mg via ORAL
  Filled 2018-03-23: qty 1

## 2018-03-23 MED ORDER — LACTULOSE 10 GM/15ML PO SOLN: 10.0000 g | Freq: Every day | ORAL | Status: DC

## 2018-03-23 MED ORDER — GLYCOPYRROLATE 0.2 MG/ML IJ SOLN: 0.4000 mg | Freq: Three times a day (TID) | INTRAMUSCULAR | Status: DC

## 2018-03-23 MED ORDER — GUAIFENESIN ER 600 MG PO TB12
600.0000 mg | ORAL_TABLET | Freq: Two times a day (BID) | ORAL | Status: DC
  Filled 2018-03-23: qty 1

## 2018-03-23 MED ORDER — DEXTROSE 50 % IV SOLN
INTRAVENOUS | Status: AC
  Administered 2018-03-23: 25 mL
  Filled 2018-03-23: qty 50

## 2018-03-23 MED ORDER — RIVAROXABAN 20 MG PO TABS: 20.0000 mg | ORAL_TABLET | Freq: Every day | ORAL | Status: DC

## 2018-03-23 MED ORDER — FINASTERIDE 5 MG PO TABS
5.0000 mg | ORAL_TABLET | Freq: Every day | ORAL | Status: DC
  Administered 2018-03-23: 5 mg via ORAL
  Filled 2018-03-23: qty 1

## 2018-03-23 MED ORDER — MAGNESIUM HYDROXIDE 400 MG/5ML PO SUSP: 30.0000 mL | Freq: Every day | ORAL | Status: DC | PRN

## 2018-03-23 MED ORDER — LACTULOSE 10 GM/15ML PO SOLN: 10.0000 g | ORAL | Status: DC

## 2018-03-23 MED ORDER — FUROSEMIDE 10 MG/ML IJ SOLN
40.0000 mg | Freq: Two times a day (BID) | INTRAMUSCULAR | Status: DC
  Administered 2018-03-23: 40 mg via INTRAVENOUS
  Filled 2018-03-23: qty 4

## 2018-03-23 NOTE — Progress Notes (Signed)
   03/23/18 1015  Vitals  BP 116/67  MAP (mmHg) 81  Pulse Rate 84  ECG Heart Rate 84  Resp (!) 22  Oxygen Therapy  SpO2 92 %  O2 Device Bi-PAP   RT asked to assist RN with weaning patient from Bipap for possible pill intake. RT doesn't feel he is ready to come off after her assessment. Contacted MD to come see patient and daughter for goals of care and plan. Daughter has been at bedside throughout the night. I will continue to monitor the patient closely.   Sheppard Evens RN

## 2018-03-23 NOTE — Progress Notes (Signed)
   03/23/18 1722  Vitals  BP 114/77  MAP (mmHg) 90  Pulse Rate (!) 112  ECG Heart Rate (!) 137  Resp 15  Oxygen Therapy  SpO2 100 %  O2 Device Non-rebreather Mask  O2 Flow Rate (L/min) 15 L/min   Notified the MD Feliz about increasing heart rate throughout the evening. From ~115 to 150. Patient appears comfortable with family at bedside.   Sheppard Evens RN

## 2018-03-23 NOTE — Progress Notes (Signed)
[facility] PROGRESS NOTE    Progress Note  [name]  FUX:[id number] DOB: 10-29-1930 DOA: 2018/04/17 PCP: Bernette Redbird, MD     Brief Narrative:   [name] is an 85 y.o. male past medical history of hypertension, chronic atrial fibrillation on Xarelto, diabetes mellitus type 2, chronic kidney disease stage III, significant right hip MSSA infection requiring multiple procedures, to make a removal due to infection, diabetes mellitus, DVT Alzheimer's dementia recently diagnosed with vertebral osteomyelitis, discitis and epidural abscess and phlegmon treated as MSSA bacteremia with a PICC line complicated by bilateral lower extremity paraplegia, chronic diastolic heart failure who was sent from the skilled nursing facility for hypoxia change in color of his sputum, started on oral levofloxacin 2 days prior to admission, went to see the infectious disease doctor for his prolonged IV antibiotics, he was transitioned to this appointment to oral cephalexin, went home and felt short of breath EMS was called was placed on BiPAP and was brought to the ED for productive cough with large amount of purulent material fever and chills.  The ER he was noted to be significantly hypoxic Assessment/Plan:   Acute respiratory failure with hypoxia due to healthcare associated pneumonia: Chest x-ray does not show edema he was having fevers Here started empirically on admission on IV Rocephin and azithromycin. He has defervesced. Blood cultures negative till date Pro calcitonin high Influenza PCR negative. Patient is lethargic cannot be on BiPAP. We will discontinue BiPAP put him on a nonrebreather.  Sepsis due to multifocal pneumonia: CT of the chest done on 2018-04-17 showed airspace disease through the left and right lung  opacity is concerning for pneumonia.  Chronic Diastolic heart failure: At the facility he was on oral diuretics and metolazone. Hyponatremic and hypokalemic likely due  to diuretics. He is massively fluid overloaded, We will continue IV Lasix, will continue IV potassium supplementation.  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  Chronic atrial fibrillation: Status post pacemaker removal in 2017 due to recurrent MSSA bacteremia. Continue Xarelto. Rate controlled, avoid beta-blockers due to bradycardia.  Macrocytic anemia:   Hypothyroidism: Continue Synthroid.  Essential hypertension: ARB on hold due to hypotension  Diabetes mellitus type II: Check an A1c, agree with sliding scale insulin. Continue to hold oral hypoglycemic agents.  BpH: Continue Flomax.    Advanced dementia with behavioral disturbances/psychiatric disorder: Continue citalopram and Lyrica.  Goals of care: Family would like to think about before making a decision. We will consult hospice imperative care. The daughter Lurena Joiner is on board with moving towards comfort care. The wife on the other hand she does not want to make any decision at this point, she does know that the patient is deteriorating quickly. Patient has a poor prognoses.    DVT prophylaxis: heparin Family Communication:daughter and wife Disposition Plan/Barrier to D/C: will probably die in house Code Status:     Code Status Orders  (From admission, onward)         Start     Ordered   04/17/2018 2131  Do not attempt resuscitation (DNR)  Continuous    Question Answer Comment  In the event of cardiac or respiratory ARREST Do not call a "code blue"   In the event of cardiac or respiratory ARREST Do not perform Intubation, CPR, defibrillation or ACLS   In the event of cardiac or respiratory ARREST Use medication by any route, position, wound care, and other measures to  relive pain and suffering. May use oxygen, suction and manual treatment of airway obstruction as needed for comfort.      04/05/2018 2130          Code Status History    Date Active Date Inactive Code Status Order ID Comments User Context   03/08/2018 1132 03/09/2018 2019 DNR [id number]  Ulice Bold, NP Inpatient   03/06/2018 1756 03/08/2018 1132 Full Code [id number]  Dorcas Carrow, MD ED   01/30/2018 2032 02/07/2018 1725 Full Code [id number]  Rometta Emery, MD Inpatient   11/03/2017 2141 11/09/2017 1811 Full Code [id number]  Carron Curie, MD Inpatient   01/10/2015 1550 01/14/2015 2115 Full Code [id number]  Russella Dar, NP Inpatient   08/10/2014 1702 08/15/2014 0254 Full Code [id number]  Gean Birchwood, MD Inpatient   11/28/2012 1808 12/02/2012 2026 Full Code [id number]  Allena Katz, PA-C Inpatient   11/25/2012 2006 11/28/2012 1808 Full Code [id number]  Nestor Lewandowsky, MD Inpatient   09/02/2012 2121 09/08/2012 1737 Full Code [id number]  Janetta Hora Inpatient   02/11/2012 2151 02/16/2012 1838 Full Code [id number]  Dorothea Ogle, MD ED        IV Access:    Peripheral IV   Procedures and diagnostic studies:   Ct Angio Chest Pe W/cm &/or Wo Cm  Result Date: 2018/04/05 CLINICAL DATA:  Recent diagnosis of pneumonia. Pulmonary embolus suspected. EXAM: CT ANGIOGRAPHY CHEST WITH CONTRAST TECHNIQUE: Multidetector CT imaging of the chest was performed using the standard protocol during bolus administration of intravenous contrast. Multiplanar CT image reconstructions and MIPs were obtained to evaluate the vascular anatomy. CONTRAST:  75 mL Isovue 370 IV COMPARISON:  Chest x-ray earlier today. MRI of the thoracic spine 01/31/2018 FINDINGS: Cardiovascular: Diffuse coronary artery calcifications and moderate aortic calcifications. Cardiomegaly. No evidence of aortic aneurysm. Small pericardial effusion. No visible filling defects in the pulmonary arteries to suggest pulmonary emboli. Mediastinum/Nodes: No mediastinal, hilar, or axillary adenopathy. Lungs/Pleura: Moderate bilateral pleural effusions, left greater than right. Airspace disease  throughout much of the lower lobes bilaterally, left worse than right as well as in the left upper lobe. Findings concerning or pneumonia, particularly on the left. Right lower lobe opacity could reflect pneumonia or compressive atelectasis. Upper Abdomen: Imaging into the upper abdomen shows no acute findings. Musculoskeletal: Chest wall soft tissues are unremarkable. Bone destruction noted at T9 with gas noted in the vertebral body and extending into the disc space. This is in an area of previously seen discitis and osteomyelitis. No acute bony abnormality. Review of the MIP images confirms the above findings. IMPRESSION: Moderate bilateral pleural effusions, left larger than right. Airspace disease throughout much of the left lung and in the right lower lobe. Left airspace opacity concerning for pneumonia. Right airspace opacity could reflect pneumonia or compressive atelectasis. Cardiomegaly, small pericardial effusion. No evidence of pulmonary embolus. Diffuse coronary artery calcifications. Aortic Atherosclerosis (ICD10-I70.0). Electronically Signed   By: Charlett Nose M.D.   On: 04/05/2018 17:48   Dg Chest Port 1 View  Result Date: 04/05/2018 CLINICAL DATA:  Shortness of breath. EXAM: PORTABLE CHEST 1 VIEW COMPARISON:  Radiograph of March 06, 2018. FINDINGS: Stable cardiomegaly. Atherosclerosis of thoracic aorta is noted. No pneumothorax is noted. Right lung is clear. Left retrocardiac opacity can not be excluded. Bony thorax is unremarkable. IMPRESSION: Stable cardiomegaly. Right lung is clear. Left retrocardiac opacity can not be excluded. Aortic Atherosclerosis (ICD10-I70.0). Electronically Signed   By: Lupita Raider, M.D.   On:  04-01-18 14:31     Medical Consultants:    None.  Anti-Infectives:   The Rocephin and azithromycin.  Subjective:    Torri Cockerell lethargic.  Objective:    Vitals:   03/23/18 0412 03/23/18 0614 03/23/18 0919 03/23/18 1012  BP:  (!) 91/47    Pulse:  (!) 107 84  82  Resp: 17 (!) 21  18  Temp:  100.1 F (37.8 C) 97.9 F (36.6 C)   TempSrc:  Axillary Axillary   SpO2: 97% 97%  94%  Weight:  121.5 kg    Height:        Intake/Output Summary (Last 24 hours) at 03/23/2018 1015 Last data filed at 03/23/2018 0500 Gross per 24 hour  Intake 316.4 ml  Output -  Net 316.4 ml   Filed Weights   03/23/18 0112 03/23/18 0614  Weight: 121.5 kg 121.5 kg    Exam: General exam: In no acute distress. Respiratory system: Moderate air movement with crackles at bases. Cardiovascular system: S1 & S2 heard, RRR. Gastrointestinal system: Abdomen is nondistended, soft and nontender.  Edema in his sacrum Central nervous system: Lethargic Extremities: His anasarca Skin: No rashes, lesions or ulcers   Data Reviewed:    Labs: Basic Metabolic Panel: Recent Labs  Lab 01-Apr-2018 1425 03/23/18 0213  NA 147* 146*  K 2.8* 2.8*  CL 107 105  CO2 28 29  GLUCOSE 102* 101*  BUN 10 11  CREATININE 1.16 1.17  CALCIUM 8.3* 8.5*   GFR Estimated Creatinine Clearance: 61.2 mL/min (by C-G formula based on SCr of 1.17 mg/dL). Liver Function Tests: Recent Labs  Lab 01-Apr-2018 1425  AST 20  ALT 9  ALKPHOS 50  BILITOT 2.1*  PROT 5.9*  ALBUMIN 2.4*   No results for input(s): LIPASE, AMYLASE in the last 168 hours. No results for input(s): AMMONIA in the last 168 hours. Coagulation profile No results for input(s): INR, PROTIME in the last 168 hours.  CBC: Recent Labs  Lab April 01, 2018 1425 03/23/18 0213  WBC 7.0 3.9*  NEUTROABS 6.2  --   HGB 11.4* 11.1*  HCT 37.3* 34.9*  MCV 101.4* 97.2  PLT 180 169   Cardiac Enzymes: Recent Labs  Lab 04-01-2018 1425  TROPONINI 0.05*   BNP (last 3 results) No results for input(s): PROBNP in the last 8760 hours. CBG: Recent Labs  Lab 03/23/18 0107 03/23/18 0747  GLUCAP 112* 87   D-Dimer: No results for input(s): DDIMER in the last 72 hours. Hgb A1c: No results for input(s): HGBA1C in the last 72  hours. Lipid Profile: No results for input(s): CHOL, HDL, LDLCALC, TRIG, CHOLHDL, LDLDIRECT in the last 72 hours. Thyroid function studies: No results for input(s): TSH, T4TOTAL, T3FREE, THYROIDAB in the last 72 hours.  Invalid input(s): FREET3 Anemia work up: No results for input(s): VITAMINB12, FOLATE, FERRITIN, TIBC, IRON, RETICCTPCT in the last 72 hours. Sepsis Labs: Recent Labs  Lab 2018/04/01 1425 2018-04-01 1450 04/01/18 2221 03/23/18 0213  PROCALCITON  --   --   --  0.35  WBC 7.0  --   --  3.9*  LATICACIDVEN  --  2.2* 1.8  --    Microbiology Recent Results (from the past 240 hour(s))  Culture, blood (routine x 2)     Status: None (Preliminary result)   Collection Time: 2018/04/01  4:00 PM  Result Value Ref Range Status   Specimen Description BLOOD RIGHT ANTECUBITAL  Final   Special Requests   Final    BOTTLES DRAWN AEROBIC AND  ANAEROBIC Blood Culture adequate volume   Culture   Final    NO GROWTH < 24 HOURS Performed at [facility], [address], [place], [place] [address]    Report Status PENDING  Incomplete  Culture, blood (routine x 2)     Status: None (Preliminary result)   Collection Time: 2018-04-16  4:09 PM  Result Value Ref Range Status   Specimen Description BLOOD LEFT ANTECUBITAL  Final   Special Requests   Final    BOTTLES DRAWN AEROBIC AND ANAEROBIC Blood Culture adequate volume   Culture   Final    NO GROWTH < 24 HOURS Performed at [facility], [address] [place], [place] [address]    Report Status PENDING  Incomplete     Medications:   . buPROPion  100 mg Oral BID  . divalproex  125 mg Oral QHS  . docusate sodium  100 mg Oral BID  . escitalopram  10 mg Oral Daily  . ferrous sulfate  325 mg Oral BID WC  . finasteride  5 mg Oral QHS  . guaiFENesin  600 mg Oral BID  . insulin aspart  0-5 Units Subcutaneous QHS  . insulin aspart  0-9 Units Subcutaneous TID WC  . lactulose  10 g Oral QHS  . lactulose  30 g Oral Daily  .  levothyroxine  88 mcg Oral QAC breakfast  . pravastatin  40 mg Oral q1800  . pregabalin  75 mg Oral QHS  . rivaroxaban  20 mg Oral Q supper  . rOPINIRole  0.5 mg Oral QHS  . sodium chloride flush  3 mL Intravenous Q12H  . terazosin  5 mg Oral QHS  . tiZANidine  2 mg Oral TID  . vitamin C  500 mg Oral BID   Continuous Infusions: . sodium chloride    . cefTRIAXone (ROCEPHIN)  IV Stopped (April 16, 2018 2304)      LOS: 1 day   Marinda Elk  [facility]  03/23/2018, 10:15 AM

## 2018-03-23 NOTE — Consult Note (Signed)
Consultation Note Date: 03/23/2018   Patient Name: [name]  DOB: [birth year]  MRN: [id number]  Age / Sex: 85 y.o., male  PCP: Virgel Bouquet, MD Referring Physician: Charlynne Cousins, MD  Reason for Consultation: Establishing goals of care  HPI/Patient Profile: 85 y.o. 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 Tommy Medal for ongoing infection resulting in T9 destruction. Also with PMH with atrial fibrillation, h/o pacemaker placement (removed s/t MSSA bacteremia), chronic pain, depression, diabetes, hypothyroidism, BPH, HTN dCHF, and likely Lewy Body dementia admitted on 03/05/2018 with SOB, cough r/t acute respiratory failure with multifocal pneumonia CHF exacerbation with pulmonary edema.   Clinical Assessment and Goals of Care:  I met today with wife, [name]. We met just a couple weeks ago with her husbands last admission. We reviewed his current critical illness complicated by his underlying chronic illness, debility, and chronic pain/discomforts. We reviewed our discussion when he expressed his desire for DNR and clear desire for comfort at the end of his life. I explained that he is suffering and we know that he is in pain. She agrees that we should follow his wishes and make him comfortable. She agrees to medication to ensure comfort.   We also discussed escalation of care and the discomfort and harms of aggressive interventions. She struggles with the details of interventions. We agree with no further lab sticks, BiPAP, and will not escalate to ICU or vasoactive infusion as these would prolong his suffering. She would like to continue oxygen and antibiotics at this time. We discussed supplementing potassium and she wishes to continue with  potassium runs BUT if they cause him pain we can discontinue. Comfort is the priority. [name]'s main goal is for family to have time to visit with him. All his daughters at here (I had a separate conversation with them with her permission). [name]'s daughter is to come this afternoon. His daughters agree with full comfort care at this time but respect [name]'s decisions as long as he does not suffer. I offered chaplain but they have opted to wait at this time.   Therapeutic listening and emotional support provided.    Primary Decision Maker HCPOA 1) wife [name] 2) dtr [place]    SUMMARY OF RECOMMENDATIONS   - DNR - Comfort focused care - Continue antibiotics and oxygen at wife's request  Code Status/Advance Care Planning:  DNR   Symptom Management:   Pain/SOB: Morphine 2 mg IV every 4 hours scheduled and every 1 hour prn. Alternative morphine SL available if loss of IV access.   Secretions/Overload: Robinul 0.4 mg IV QID.   Palliative Prophylaxis:   Aspiration, Delirium Protocol, Frequent Pain Assessment, Oral Care and Turn Reposition  Psycho-social/Spiritual:   Desire for further Chaplaincy support:no  Additional Recommendations: Grief/Bereavement Support  Prognosis:   Hours - Days  Discharge Planning: Anticipated Hospital Death      Primary Diagnoses: Present on Admission: .  Staphylococcus aureus bacteremia . Other chronic pain . Osteomyelitis of thoracic spine ([place]) . MSSA (methicillin susceptible Staphylococcus aureus) infection . Major depression . Lower extremity weakness . Hypokalemia . Hip dislocation, right ([place]) . Essential hypertension . Epidural abscess . Chronic kidney disease, stage III (moderate) ([facility]) . Atrial fibrillation ([place])   I have reviewed the medical record, interviewed the patient and family, and examined the patient. The following aspects are pertinent.  Past Medical History:  Diagnosis Date  . Alzheimer's type dementia  ([place]) 12/07/2016  . Anemia   . Anxiety   . Arthritis   . Bacteremia   . Bradycardia    a. PPM removed in 2014 due to bacteremia.  . CHF exacerbation ([place]) 03/08/2018  . Chronic atrial fibrillation   . Chronic right shoulder pain 06/17/2015  . CKD (chronic kidney disease), stage III ([place])   . Complication of anesthesia    " Terrible Nightmares"  . Constipation   . Depression   . Diabetes mellitus without complication ([place])    Type II  . DVT (deep venous thrombosis) ([place])    a. in 2019 - pt reports behind knee - was on Xarelto 86m BID now 247mdaily.  . Encounter for removal of cardiac resynchronization therapy pacemaker 2017  . Grief 06/17/2015  . History of blood product transfusion   . Hypertension   . Hypothyroidism   . Infected pacemaker ([facility]  . Major depression 06/17/2015  . MSSA (methicillin susceptible Staphylococcus aureus) infection   . OAB (overactive bladder)   . Overactive bladder   . Septic hip ([facility]  . Septicemia ([place]  . Severe depression ([facility]/12/2016  . Staphylococcus aureus infection   . Thoracic spine pain 11/28/2014   Social History   Socioeconomic History  . Marital status: Married    Spouse name: Not on file  . Number of children: Not on file  . Years of education: Not on file  . Highest education level: Not on file  Occupational History  . Not on file  Social Needs  . Financial resource strain: Not on file  . Food insecurity:    Worry: Not on file    Inability: Not on file  . Transportation needs:    Medical: Not on file    Non-medical: Not on file  Tobacco Use  . Smoking status: Never Smoker  . Smokeless tobacco: Never Used  Substance and Sexual Activity  . Alcohol use: No    Alcohol/week: 0.0 standard drinks  . Drug use: No  . Sexual activity: Not on file  Lifestyle  . Physical activity:    Days per week: Not on file    Minutes per session: Not on file  . Stress: Not on file  Relationships  . Social connections:    Talks  on phone: Not on file    Gets together: Not on file    Attends religious service: Not on file    Active member of club or organization: Not on file    Attends meetings of clubs or organizations: Not on file    Relationship status: Not on file  Other Topics Concern  . Not on file  Social History Narrative  . Not on file   Family History  Problem Relation Age of Onset  . Heart failure Mother    Scheduled Meds: . furosemide  40 mg Intravenous Q12H  . glycopyrrolate  0.4 mg Intravenous QID  .  morphine injection  2 mg Intravenous Q4H  Or  . morphine CONCENTRATE  5 mg Sublingual Q4H  . sodium chloride flush  3 mL Intravenous Q12H  . trolamine salicylate   Topical BID   Continuous Infusions: . sodium chloride    . azithromycin 500 mg (03/23/18 1336)  . cefTRIAXone (ROCEPHIN)  IV Stopped (03/23/18 1028)  . potassium chloride 10 mEq (03/23/18 1442)   PRN Meds:.sodium chloride, morphine injection **OR** morphine CONCENTRATE, nystatin, [DISCONTINUED] ondansetron **OR** ondansetron (ZOFRAN) IV, sodium chloride flush Allergies  Allergen Reactions  . Ace Inhibitors Cough  . Gabapentin Other (See Comments)    Unknown reaction  . Nsaids Other (See Comments)    Renal insufficiency   . Zolpidem Other (See Comments)    hallucinations Nightmares, irritable, nervous Disorientation   Review of Systems  Unable to perform ROS: Acuity of condition    Physical Exam Vitals signs and nursing note reviewed.  Constitutional:      General: He is in acute distress.     Appearance: He is ill-appearing.  Cardiovascular:     Rate and Rhythm: Tachycardia present.  Pulmonary:     Effort: Tachypnea, accessory muscle usage and respiratory distress present.     Breath sounds: Rhonchi and rales present.  Neurological:     Mental Status: He is lethargic and disoriented.     Vital Signs: BP 116/67   Pulse 94   Temp 97.9 F (36.6 C) (Axillary)   Resp 16   Ht '5\' 11"'  (1.803 m)   Wt 121.5 kg    SpO2 97%   BMI 37.36 kg/m  Pain Scale: Faces   Pain Score: 0-No pain   SpO2: SpO2: 97 % O2 Device:SpO2: 97 % O2 Flow Rate: .O2 Flow Rate (L/min): 15 L/min  IO: Intake/output summary:   Intake/Output Summary (Last 24 hours) at 03/23/2018 1445 Last data filed at 03/23/2018 1336 Gross per 24 hour  Intake 416.4 ml  Output 400 ml  Net 16.4 ml    LBM: Last BM Date: 03/23/18 Baseline Weight: Weight: 121.5 kg Most recent weight: Weight: 121.5 kg     Palliative Assessment/Data: 20%    Time In: 1300 Time Out: 1430 Time Total: 90 min Greater than 50%  of this time was spent counseling and coordinating care related to the above assessment and plan.  Signed by: Vinie Sill, NP Palliative Medicine Team Pager # [phone] (M-F 8a-5p) Team Phone # [phone] (Nights/Weekends)

## 2018-03-23 NOTE — Progress Notes (Signed)
Palliative:  Full note to follow. I met today with wife, Larey Dresser. We met just a couple weeks ago with her husbands last admission. We reviewed his current critical illness complicated by his underlying chronic illness, debility, and chronic pain/discomforts. We reviewed our discussion when he expressed his desire for DNR and clear desire for comfort at the end of his life. I explained that he is suffering and we know that he is in pain. She agrees that we should follow his wishes and make him comfortable. She agrees to medication to ensure comfort.   We also discussed escalation of care and the discomfort and harms of aggressive interventions. She struggles with the details of interventions. We agree with no further lab sticks, BiPAP, and will not escalate to ICU or vasoactive infusion as these would prolong his suffering. She would like to continue oxygen and antibiotics at this time. We discussed supplementing potassium and she wishes to continue with potassium runs BUT if they cause him pain we can discontinue. Comfort is the priority. [name]'s main goal is for family to have time to visit with him. All his daughters at here (I had a separate conversation with them with her permission). [name]'s daughter is to come this afternoon. His daughters agree with full comfort care at this time but respect [name]'s decisions as long as he does not suffer. I offered chaplain but they have opted to wait at this time.   Therapeutic listening and emotional support provided.   Vinie Sill, NP Palliative Medicine Team Pager # [phone] (M-F 8a-5p) Team Phone # [phone] (Nights/Weekends)

## 2018-03-23 NOTE — Progress Notes (Signed)
  Echocardiogram 2D Echocardiogram has been performed.  [name] 03/23/2018, 1:59 PM

## 2018-03-23 NOTE — Progress Notes (Signed)
Per MD for RT to remove BIPAP and discontinue - Per MD for RT to place pt on NRB. Oral sxn done-small clear thin. RN aware.

## 2018-03-23 NOTE — Social Work (Signed)
Patient from [facility], long term care. Noted palliative following. Unable to reach wife by phone today. Full assessment to follow. CSW will follow to support with disposition planning pending patient's progress.  Abigail Butts, LCSW [phone]

## 2018-03-24 DIAGNOSIS — G929 Unspecified toxic encephalopathy: Secondary | ICD-10-CM

## 2018-03-24 DIAGNOSIS — G92 Toxic encephalopathy: Secondary | ICD-10-CM

## 2018-03-24 DIAGNOSIS — I4819 Other persistent atrial fibrillation: Secondary | ICD-10-CM

## 2018-03-24 DIAGNOSIS — R0603 Acute respiratory distress: Secondary | ICD-10-CM

## 2018-03-24 MED ORDER — PROMETHAZINE HCL 25 MG PO TABS: 25.0000 mg | ORAL_TABLET | Freq: Four times a day (QID) | ORAL | Status: DC | PRN

## 2018-03-24 MED ORDER — LORAZEPAM 2 MG/ML PO CONC
1.0000 mg | Freq: Two times a day (BID) | ORAL | Status: DC
  Administered 2018-03-24: 1 mg via ORAL
  Filled 2018-03-24: qty 0.5

## 2018-03-24 MED ORDER — MORPHINE SULFATE (CONCENTRATE) 10 MG/0.5ML PO SOLN
5.0000 mg | ORAL | Status: DC | PRN
  Administered 2018-03-24: 10 mg via SUBLINGUAL
  Filled 2018-03-24: qty 0.5

## 2018-03-24 MED ORDER — ALBUTEROL SULFATE (2.5 MG/3ML) 0.083% IN NEBU: 2.5000 mg | INHALATION_SOLUTION | RESPIRATORY_TRACT | Status: DC | PRN

## 2018-03-24 MED ORDER — METOLAZONE 5 MG PO TABS
5.0000 mg | ORAL_TABLET | Freq: Every day | ORAL | Status: DC
  Administered 2018-03-24 – 2018-03-25 (×2): 5 mg via ORAL
  Filled 2018-03-24 (×2): qty 1

## 2018-03-24 MED ORDER — POTASSIUM CHLORIDE CRYS ER 20 MEQ PO TBCR
40.0000 meq | EXTENDED_RELEASE_TABLET | Freq: Two times a day (BID) | ORAL | Status: DC
  Administered 2018-03-24: 40 meq via ORAL
  Filled 2018-03-24: qty 2

## 2018-03-24 MED ORDER — MORPHINE SULFATE (CONCENTRATE) 10 MG/0.5ML PO SOLN
10.0000 mg | ORAL | Status: DC | PRN
  Administered 2018-03-24: 10 mg via SUBLINGUAL
  Administered 2018-03-24 – 2018-03-25 (×4): 20 mg via SUBLINGUAL
  Filled 2018-03-24 (×3): qty 1
  Filled 2018-03-24: qty 0.5
  Filled 2018-03-24: qty 1

## 2018-03-24 MED ORDER — LORAZEPAM 2 MG/ML PO CONC
0.5000 mg | Freq: Four times a day (QID) | ORAL | Status: DC | PRN
  Filled 2018-03-24: qty 0.25

## 2018-03-24 MED ORDER — GLYCOPYRROLATE 1 MG PO TABS
2.0000 mg | ORAL_TABLET | Freq: Three times a day (TID) | ORAL | Status: DC
  Administered 2018-03-24 – 2018-03-25 (×3): 2 mg via ORAL
  Filled 2018-03-24 (×3): qty 2

## 2018-03-24 MED ORDER — HYDROCOD POLST-CPM POLST ER 10-8 MG/5ML PO SUER
5.0000 mL | Freq: Three times a day (TID) | ORAL | Status: DC | PRN
  Administered 2018-03-24: 5 mL via ORAL
  Filled 2018-03-24: qty 5

## 2018-03-24 MED ORDER — MENTHOL 3 MG MT LOZG
1.0000 | LOZENGE | OROMUCOSAL | Status: DC | PRN
  Filled 2018-03-24: qty 9

## 2018-03-24 MED ORDER — METOPROLOL TARTRATE 25 MG PO TABS
25.0000 mg | ORAL_TABLET | Freq: Two times a day (BID) | ORAL | Status: DC
  Administered 2018-03-24 – 2018-03-25 (×2): 25 mg via ORAL
  Filled 2018-03-24 (×2): qty 1

## 2018-03-24 MED ORDER — HYDROCOD POLST-CPM POLST ER 10-8 MG/5ML PO SUER: 5.0000 mL | Freq: Two times a day (BID) | ORAL | Status: DC | PRN

## 2018-03-24 MED ORDER — SENNA 8.6 MG PO TABS
1.0000 | ORAL_TABLET | Freq: Every day | ORAL | Status: DC
  Administered 2018-03-25: 8.6 mg via ORAL
  Filled 2018-03-24: qty 1

## 2018-03-24 MED ORDER — ENSURE ENLIVE PO LIQD
237.0000 mL | Freq: Three times a day (TID) | ORAL | Status: DC
  Administered 2018-03-24 (×2): 237 mL via ORAL

## 2018-03-24 MED ORDER — FUROSEMIDE 40 MG PO TABS
40.0000 mg | ORAL_TABLET | Freq: Two times a day (BID) | ORAL | Status: DC
  Administered 2018-03-24 – 2018-03-25 (×2): 40 mg via ORAL
  Filled 2018-03-24 (×2): qty 1

## 2018-03-24 NOTE — Progress Notes (Signed)
[facility] PROGRESS NOTE    Progress Note  [name]  NGE:[id number] DOB: 1930-08-20 DOA: 04/04/2018 PCP: Bernette Redbird, MD     Brief Narrative:   [name] is an 85 y.o. male past medical history of hypertension, chronic atrial fibrillation on Xarelto, diabetes mellitus type 2, chronic kidney disease stage III, significant right hip MSSA infection requiring multiple procedures, to make a removal due to infection, diabetes mellitus, DVT Alzheimer's dementia recently diagnosed with vertebral osteomyelitis, discitis and epidural abscess and phlegmon treated as MSSA bacteremia with a PICC line complicated by bilateral lower extremity paraplegia, chronic diastolic heart failure who was sent from the skilled nursing facility for hypoxia change in color of his sputum, started on oral levofloxacin 2 days prior to admission, went to see the infectious disease doctor for his prolonged IV antibiotics, he was transitioned to this appointment to oral cephalexin, went home and felt short of breath EMS was called was placed on BiPAP and was brought to the ED for productive cough with large amount of purulent material fever and chills.  The ER he was noted to be significantly hypoxic Assessment/Plan:   Acute respiratory failure with hypoxia due to healthcare associated pneumonia: Started empirically on admission on IV Rocephin and azithromycin. He has defervesced. Blood cultures negative till date Pro calcitonin high Influenza PCR negative. Patient now on nasal cannula awake able to carry on a conversation. Continues to have an ongoing productive cough.  Acute toxic encephalopathy: Now improved.  Sepsis due to multifocal pneumonia: CT of the chest done on Apr 04, 2018 showed airspace disease through the left and right lung  opacity is concerning for pneumonia. Continue empiric IV antibiotics Rocephin and azithromycin  Chronic Diastolic heart failure: At the facility he was on oral  diuretics and metolazone. Hyponatremic and hypokalemic likely due to diuretics. He seems massively fluid overloaded on physical exam. We will await basic metabolic panel before continuing diuretic therapy.  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  Chronic atrial fibrillation: Status post pacemaker removal in 2017 due to recurrent MSSA bacteremia. Continue Xarelto. Rate controlled, avoid beta-blockers due to bradycardia.  Macrocytic anemia:   Hypothyroidism: Continue Synthroid.  Essential hypertension: ARB on hold due to hypotension  Diabetes mellitus type II: Cont. with sliding scale insulin. Continue to hold oral hypoglycemic agents.  BpH: Continue Flomax.    Advanced dementia with behavioral disturbances/psychiatric disorder: Continue citalopram and Lyrica.  Hypokalemia: Replete orally recheck in the morning.  Goals of care: Family to continue to have discussion with hospice and palliative care.    DVT prophylaxis: heparin Family Communication:daughter and wife Disposition Plan/Barrier to D/C: Unable to determine. Code Status:     Code Status Orders  (From admission, onward)         Start     Ordered   04-Apr-2018 2131  Do not attempt resuscitation (DNR)  Continuous    Question Answer Comment  In the event of cardiac or respiratory ARREST Do not call a "code blue"   In the event of cardiac or respiratory ARREST Do not perform Intubation, CPR, defibrillation or ACLS   In the event of cardiac or respiratory ARREST Use medication by any route, position, wound care, and other measures to relive pain and suffering. May use oxygen, suction and manual treatment of airway obstruction as needed for comfort.      04-04-18 2130        Code Status History  Date Active Date Inactive Code Status Order ID Comments User Context   03/08/2018 1132 03/09/2018 2019 DNR  [id number]  Ulice Bold, NP Inpatient   03/06/2018 1756 03/08/2018 1132 Full Code [id number]  Dorcas Carrow, MD ED   01/30/2018 2032 02/07/2018 1725 Full Code [id number]  Rometta Emery, MD Inpatient   11/03/2017 2141 11/09/2017 1811 Full Code [id number]  Carron Curie, MD Inpatient   01/10/2015 1550 01/14/2015 2115 Full Code [id number]  Russella Dar, NP Inpatient   08/10/2014 1702 08/15/2014 0254 Full Code [id number]  Gean Birchwood, MD Inpatient   11/28/2012 1808 12/02/2012 2026 Full Code [id number]  Allena Katz, PA-C Inpatient   11/25/2012 2006 11/28/2012 1808 Full Code [id number]  Nestor Lewandowsky, MD Inpatient   09/02/2012 2121 09/08/2012 1737 Full Code [id number]  Janetta Hora Inpatient   02/11/2012 2151 02/16/2012 1838 Full Code [id number]  Dorothea Ogle, MD ED        IV Access:    Peripheral IV   Procedures and diagnostic studies:   Ct Angio Chest Pe W/cm &/or Wo Cm  Result Date: Mar 27, 2018 CLINICAL DATA:  Recent diagnosis of pneumonia. Pulmonary embolus suspected. EXAM: CT ANGIOGRAPHY CHEST WITH CONTRAST TECHNIQUE: Multidetector CT imaging of the chest was performed using the standard protocol during bolus administration of intravenous contrast. Multiplanar CT image reconstructions and MIPs were obtained to evaluate the vascular anatomy. CONTRAST:  75 mL Isovue 370 IV COMPARISON:  Chest x-ray earlier today. MRI of the thoracic spine 01/31/2018 FINDINGS: Cardiovascular: Diffuse coronary artery calcifications and moderate aortic calcifications. Cardiomegaly. No evidence of aortic aneurysm. Small pericardial effusion. No visible filling defects in the pulmonary arteries to suggest pulmonary emboli. Mediastinum/Nodes: No mediastinal, hilar, or axillary adenopathy. Lungs/Pleura: Moderate bilateral pleural effusions, left greater than right. Airspace disease throughout much of the lower lobes bilaterally, left worse than right as well as in the left upper lobe. Findings concerning or pneumonia,  particularly on the left. Right lower lobe opacity could reflect pneumonia or compressive atelectasis. Upper Abdomen: Imaging into the upper abdomen shows no acute findings. Musculoskeletal: Chest wall soft tissues are unremarkable. Bone destruction noted at T9 with gas noted in the vertebral body and extending into the disc space. This is in an area of previously seen discitis and osteomyelitis. No acute bony abnormality. Review of the MIP images confirms the above findings. IMPRESSION: Moderate bilateral pleural effusions, left larger than right. Airspace disease throughout much of the left lung and in the right lower lobe. Left airspace opacity concerning for pneumonia. Right airspace opacity could reflect pneumonia or compressive atelectasis. Cardiomegaly, small pericardial effusion. No evidence of pulmonary embolus. Diffuse coronary artery calcifications. Aortic Atherosclerosis (ICD10-I70.0). Electronically Signed   By: Charlett Nose M.D.   On: March 27, 2018 17:48   Dg Chest Port 1 View  Result Date: March 27, 2018 CLINICAL DATA:  Shortness of breath. EXAM: PORTABLE CHEST 1 VIEW COMPARISON:  Radiograph of March 06, 2018. FINDINGS: Stable cardiomegaly. Atherosclerosis of thoracic aorta is noted. No pneumothorax is noted. Right lung is clear. Left retrocardiac opacity can not be excluded. Bony thorax is unremarkable. IMPRESSION: Stable cardiomegaly. Right lung is clear. Left retrocardiac opacity can not be excluded. Aortic Atherosclerosis (ICD10-I70.0). Electronically Signed   By: Lupita Raider, M.D.   On: 03/27/18 14:31     Medical Consultants:    None.  Anti-Infectives:   The Rocephin and azithromycin.  Subjective:    [name] awake today and able to carry on a conversation he wants to  have flu today.  Objective:    Vitals:   03/23/18 1730 03/23/18 1942 03/24/18 0017 03/24/18 0526  BP:  110/76 125/71 120/85  Pulse:  95 (!) 104 78  Resp: 20     Temp:  97.9 F (36.6 C) 98.3 F  (36.8 C) 98 F (36.7 C)  TempSrc:  Oral Oral Oral  SpO2: 100% 99% 100% 93%  Weight:    122.5 kg  Height:        Intake/Output Summary (Last 24 hours) at 03/24/2018 1047 Last data filed at 03/23/2018 1336 Gross per 24 hour  Intake 100 ml  Output 400 ml  Net -300 ml   Filed Weights   03/23/18 0112 03/23/18 0614 03/24/18 0526  Weight: 121.5 kg 121.5 kg 122.5 kg    Exam: General exam: In no acute distress. Respiratory system: Moderate air movement with crackles at bases. Cardiovascular system: S1 & S2 heard, RRR. Gastrointestinal system: Abdomen is nondistended, soft and nontender.  Edema in his sacrum Central nervous system: Lethargic Extremities: His anasarca Skin: No rashes, lesions or ulcers   Data Reviewed:    Labs: Basic Metabolic Panel: Recent Labs  Lab 03/17/2018 1425 03/23/18 0213  NA 147* 146*  K 2.8* 2.8*  CL 107 105  CO2 28 29  GLUCOSE 102* 101*  BUN 10 11  CREATININE 1.16 1.17  CALCIUM 8.3* 8.5*   GFR Estimated Creatinine Clearance: 61.5 mL/min (by C-G formula based on SCr of 1.17 mg/dL). Liver Function Tests: Recent Labs  Lab 03/24/2018 1425  AST 20  ALT 9  ALKPHOS 50  BILITOT 2.1*  PROT 5.9*  ALBUMIN 2.4*   No results for input(s): LIPASE, AMYLASE in the last 168 hours. No results for input(s): AMMONIA in the last 168 hours. Coagulation profile No results for input(s): INR, PROTIME in the last 168 hours.  CBC: Recent Labs  Lab 03/16/2018 1425 03/23/18 0213  WBC 7.0 3.9*  NEUTROABS 6.2  --   HGB 11.4* 11.1*  HCT 37.3* 34.9*  MCV 101.4* 97.2  PLT 180 169   Cardiac Enzymes: Recent Labs  Lab 03/18/2018 1425  TROPONINI 0.05*   BNP (last 3 results) No results for input(s): PROBNP in the last 8760 hours. CBG: Recent Labs  Lab 03/23/18 0107 03/23/18 0747 03/23/18 1343 03/23/18 2111 03/23/18 2150  GLUCAP 112* 87 81 58* 64*   D-Dimer: No results for input(s): DDIMER in the last 72 hours. Hgb A1c: No results for input(s):  HGBA1C in the last 72 hours. Lipid Profile: No results for input(s): CHOL, HDL, LDLCALC, TRIG, CHOLHDL, LDLDIRECT in the last 72 hours. Thyroid function studies: No results for input(s): TSH, T4TOTAL, T3FREE, THYROIDAB in the last 72 hours.  Invalid input(s): FREET3 Anemia work up: No results for input(s): VITAMINB12, FOLATE, FERRITIN, TIBC, IRON, RETICCTPCT in the last 72 hours. Sepsis Labs: Recent Labs  Lab 03/12/2018 1425 03/01/2018 1450 02/28/2018 2221 03/23/18 0213  PROCALCITON  --   --   --  0.35  WBC 7.0  --   --  3.9*  LATICACIDVEN  --  2.2* 1.8  --    Microbiology Recent Results (from the past 240 hour(s))  Culture, blood (routine x 2)     Status: None (Preliminary result)   Collection Time: 03/10/2018  4:00 PM  Result Value Ref Range Status   Specimen Description BLOOD RIGHT ANTECUBITAL  Final   Special Requests   Final    BOTTLES DRAWN AEROBIC AND ANAEROBIC Blood Culture adequate volume   Culture  Final    NO GROWTH < 24 HOURS Performed at [facility], [address] [place], [place] [address]    Report Status PENDING  Incomplete  Culture, blood (routine x 2)     Status: None (Preliminary result)   Collection Time: 04-03-18  4:09 PM  Result Value Ref Range Status   Specimen Description BLOOD LEFT ANTECUBITAL  Final   Special Requests   Final    BOTTLES DRAWN AEROBIC AND ANAEROBIC Blood Culture adequate volume   Culture   Final    NO GROWTH < 24 HOURS Performed at [facility], [address], [place], [place] [address]    Report Status PENDING  Incomplete  Respiratory Panel by PCR     Status: None   Collection Time: April 03, 2018  9:18 PM  Result Value Ref Range Status   Adenovirus NOT DETECTED NOT DETECTED Final   Coronavirus 229E NOT DETECTED NOT DETECTED Final    Comment: (NOTE) The Coronavirus on the Respiratory Panel, DOES NOT test for the novel  Coronavirus (2019 nCoV)    Coronavirus HKU1 NOT DETECTED NOT DETECTED Final   Coronavirus NL63 NOT  DETECTED NOT DETECTED Final   Coronavirus OC43 NOT DETECTED NOT DETECTED Final   Metapneumovirus NOT DETECTED NOT DETECTED Final   Rhinovirus / Enterovirus NOT DETECTED NOT DETECTED Final   Influenza A NOT DETECTED NOT DETECTED Final   Influenza B NOT DETECTED NOT DETECTED Final   Parainfluenza Virus 1 NOT DETECTED NOT DETECTED Final   Parainfluenza Virus 2 NOT DETECTED NOT DETECTED Final   Parainfluenza Virus 3 NOT DETECTED NOT DETECTED Final   Parainfluenza Virus 4 NOT DETECTED NOT DETECTED Final   Respiratory Syncytial Virus NOT DETECTED NOT DETECTED Final   Bordetella pertussis NOT DETECTED NOT DETECTED Final   Chlamydophila pneumoniae NOT DETECTED NOT DETECTED Final   Mycoplasma pneumoniae NOT DETECTED NOT DETECTED Final    Comment: Performed at [facility], [address], [place], [place] [address]     Medications:   . glycopyrrolate  0.4 mg Intravenous QID  .  morphine injection  2 mg Intravenous Q4H   Or  . morphine CONCENTRATE  5 mg Sublingual Q4H  . MUSCLE RUB   Topical BID  . sodium chloride flush  3 mL Intravenous Q12H   Continuous Infusions: . sodium chloride    . azithromycin 500 mg (03/23/18 1336)  . cefTRIAXone (ROCEPHIN)  IV 2 g (03/24/18 0849)      LOS: 2 days   Marinda Elk  [facility]  03/24/2018, 10:47 AM

## 2018-03-24 NOTE — Clinical Social Work Note (Addendum)
Received call from [facility] regarding hospice facility referral. She met with family who confirmed that [facility] is first preference. Referral made to Venia Carbon, RN.  Dayton Scrape, [place]  3:05 pm No beds at [facility] today. Anderson Malta will follow up with family and let us know if a bed opens up tomorrow.  Dayton Scrape, [place]

## 2018-03-24 NOTE — Progress Notes (Signed)
Rerounded on patient to check his symptom management.  He is still SOB, he is using suction as his secretions are increasing.  He has frequent productive coughing and complains of sore throat.  Discussed with family and RN.  Will try tussionex and an increased dose of SL morphine.  If he is alert enough afterward we can add cough drops (wife's request).  Joshua Richards, PA-C Palliative Medicine Pager: [phone]   No charge note

## 2018-03-24 NOTE — Progress Notes (Signed)
Civil engineer, contracting [facility]ice  Received request for [facility] from [place].  Chart and patient under review for bed availability.  Spoke with wife to confirm interest, offer support and answer questions.  Will update wife and LCSW once determination of bed availability has been made.  Thank you, Wallis Bamberg BSN, RN [facility] Liaison (direct # in [place]) [phone]

## 2018-03-24 NOTE — Progress Notes (Signed)
Lab unable to get labs. Daughter/Wife at bedside and did not want them to keep sticking pt. Both IV's pt had are no longer working. Family did not want me to get IV team to restart at this time. Wanted to talk amongst themselves about next step. Made Dr David Stall aware and have paged palliative care to see if they can speak with family. Cont to monitor. Emelda Brothers RN

## 2018-03-24 NOTE — Progress Notes (Addendum)
Daily Progress Note   Patient Name: [name]       Date: 8/92/1194 DOB: May 01, 1930  Age: 85 y.o. MRN#: [id number] Attending Physician: Charlynne Cousins, MD Primary Care Physician: Virgel Bouquet, MD Admit Date: 02/26/2018  Reason for Consultation/Follow-up: Establishing goals of care, Hospice Evaluation and Psychosocial/spiritual support  Subjective: Called by RN.  Patient lost IV access and refuses further IV access.  I met at bedside with patient, wife, daughter, and pastor.  Patient confirms that he understands his body will not improve.  His spirit is calm and he knows that he is going "home" to a place where he will be healed.  We discussed transfer to hospice house.  After much discussion everyone (pt, wife, dtr, pastor) all support transfer to [facility].  We discussed symptoms - the family expresses concerns about anxiety, SOB, pain.  He has a significant cough.  He loves chocolate ensure and strawberry milkshakes from [employer].   Assessment: Very pleasant man.  Dying of respiratory failure at this point.  Patient and family accept that he is dying and want him to be comfortable.   Patient Profile/HPI:  85 y.o. 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 Tommy Medal for ongoing infection resulting in T9 destruction. Also with PMH with atrial fibrillation, h/o pacemaker placement (removed s/t MSSA bacteremia), chronic pain, depression, diabetes, hypothyroidism, BPH, HTN dCHF, and likely Lewy Body dementia admitted on 03/04/2018 with SOB, cough r/t acute respiratory failure with multifocal pneumonia CHF exacerbation with pulmonary  edema.     Length of Stay: 2  Current Medications: Scheduled Meds:  . feeding supplement (ENSURE ENLIVE)  237 mL Oral TID BM  . furosemide  40 mg Oral BID  . glycopyrrolate  0.4 mg Intravenous QID  . LORazepam  1 mg Oral BID  . metolazone  5 mg Oral Daily  . metoprolol tartrate  25 mg Oral BID  . MUSCLE RUB   Topical BID  . senna  1 tablet Oral Daily  . sodium chloride flush  3 mL Intravenous Q12H    Continuous Infusions: . sodium chloride      PRN Meds: sodium chloride, albuterol, LORazepam, [DISCONTINUED]  morphine injection **OR** morphine CONCENTRATE, nystatin, promethazine,  sodium chloride flush  Physical Exam        Well developed obese male, awake, alert, coherent CV rrr resp on 16+ liters and becomes SOB when speaking.  Productive cough, bilateral wheeze on auscultation. Abdomen obese, nt, firm Ext.  Arms have 2-3+ edema  Vital Signs: BP 120/85 (BP Location: Left Wrist)   Pulse 78   Temp 98 F (36.7 C) (Oral)   Resp 20   Ht _0  (1.803 m)   Wt 122.5 kg   SpO2 93%   BMI 37.67 kg/m  SpO2: SpO2: 93 % O2 Device: O2 Device: Oxymask O2 Flow Rate: O2 Flow Rate (L/min): 16 L/min  Intake/output summary: No intake or output data in the 24 hours ending 03/24/18 1414 LBM: Last BM Date: 03/23/18 Baseline Weight: Weight: 121.5 kg Most recent weight: Weight: 122.5 kg       Palliative Assessment/Data: 20    Flowsheet Rows     Most Recent Value  Intake Tab  Referral Department  Hospitalist  Unit at Time of Referral  Cardiac/Telemetry Unit  Palliative Care Primary Diagnosis  Cardiac  Date Notified  03/23/18  Palliative Care Type  Return patient Palliative Care  Reason for referral  End of Life Care Assistance  Date of Admission  03/13/2018  Date first seen by Palliative Care  03/23/18  # of days Palliative referral response time  0 Day(s)  # of days IP prior to Palliative referral  1  Clinical Assessment  Psychosocial & Spiritual Assessment  Palliative  Care Outcomes      Patient Active Problem List   Diagnosis Date Noted  . Toxic encephalopathy 03/24/2018  . CHF exacerbation ([place]) 03/10/2018  . Acute respiratory failure with hypoxia ([place]) 02/28/2018  . Goals of care, counseling/discussion   . Palliative care encounter   . Spinal epidural abscess 03/06/2018  . Pressure injury of skin 02/05/2018  . 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 Hypotension 01/30/2018  . Syncope 01/30/2018  . Facial droop 11/04/2017  . Left arm weakness 11/04/2017  . Symptomatic bradycardia 11/03/2017  . Bradycardia   . Altered mental status   . Bilateral leg weakness 06/27/2017  . Depression 12/07/2016  . Alzheimer's type dementia ([place]) 12/07/2016  . Anxiety 01/11/2016  . Other chronic pain 06/17/2015  . Grief 06/17/2015  . Major depression 06/17/2015  . Type 2 diabetes mellitus with complication ([place])   . Discitis 01/10/2015  . Osteomyelitis of thoracic spine ([place]) 01/10/2015  . Thoracic spine pain 11/28/2014  . Elevated PSA 11/08/2014  . Prosthetic hip infection ([place]) 02/16/2013  . MSSA (methicillin susceptible Staphylococcus aureus) infection   . Infected pacemaker ([place])   . Closed dislocation of right hip ([place]) 10/02/2012    Class: Acute  . Retroperitoneal fluid collection 08/28/2012  . Staphylococcus aureus bacteremia 08/27/2012  . Hypokalemia 08/26/2012  . DM type 2 (diabetes mellitus, type 2) ([place]) 08/26/2012  . Thrombocytopenia ([place]) 03/21/2012  . Hypothyroidism 02/14/2012  . Hyperlipidemia LDL goal <100 02/14/2012  . Benign prostatic hyperplasia 02/14/2012  . Lower extremity weakness 02/11/2012  . Anemia in chronic kidney disease 02/11/2012  . Chronic kidney disease, stage III (moderate) ([place]) 02/11/2012  . Atrial fibrillation ([place]) 02/11/2012  . Spinal stenosis 02/11/2012  . Hip dislocation, right ([place]) 11/30/2010  . Essential hypertension 11/30/2010    Palliative Care Plan    Recommendations/Plan:  Symptoms:   Will discontinue  all IV meds (as there is no access).  Rapid heart rate - will start metoprolol bid  SOB -  SL morphine prn  Wheeze - albuterol nebs  Cough - tussionex  Anxiety - ativan scheduled and PRN  Constipation - senna.  (no bowel movement since Monday)  Secretions - robinul   Volume overload - oral lasix and zaroxlyn   Family requests transfer to [facility] as soon as possible.  Goals of Care and Additional Recommendations:  Limitations on Scope of Treatment: Full Comfort Care  Code Status:  DNR  Prognosis:   Hours - Days   Discharge Planning:  Hospice facility  Care plan was discussed with family, patient, RN, attending MD (paged)  Thank you for allowing the Palliative Medicine Team to assist in the care of this patient.  Total time spent:  60 min.     Greater than 50%  of this time was spent counseling and coordinating care related to the above assessment and plan.  Florentina Jenny, PA-C Palliative Medicine  Please contact Palliative MedicineTeam phone at [phone] for questions and concerns between 7 am - 7 pm.   Please see AMION for individual provider pager numbers.

## 2018-03-25 DIAGNOSIS — F05 Delirium due to known physiological condition: Secondary | ICD-10-CM

## 2018-03-25 DIAGNOSIS — G062 Extradural and subdural abscess, unspecified: Secondary | ICD-10-CM

## 2018-03-25 DIAGNOSIS — G92 Toxic encephalopathy: Secondary | ICD-10-CM

## 2018-03-25 DIAGNOSIS — I4821 Permanent atrial fibrillation: Secondary | ICD-10-CM

## 2018-03-25 MED ORDER — SODIUM CHLORIDE 0.9 % IV SOLN
2.0000 mg/h | INTRAVENOUS | Status: DC
  Administered 2018-03-25: 2 mg/h via SUBCUTANEOUS
  Filled 2018-03-25: qty 20

## 2018-03-25 MED ORDER — MORPHINE SULFATE (PF) 4 MG/ML IV SOLN
4.0000 mg | INTRAVENOUS | Status: DC | PRN
  Filled 2018-03-25: qty 1

## 2018-03-25 MED ORDER — HALOPERIDOL LACTATE 2 MG/ML PO CONC
5.0000 mg | Freq: Once | ORAL | Status: AC
  Filled 2018-03-25: qty 2.5

## 2018-03-25 MED ORDER — FUROSEMIDE 10 MG/ML IJ SOLN: 80.0000 mg | Freq: Every day | INTRAMUSCULAR | Status: DC

## 2018-03-25 MED ORDER — ONDANSETRON 4 MG PO TBDP
4.0000 mg | ORAL_TABLET | Freq: Three times a day (TID) | ORAL | Status: DC | PRN
  Filled 2018-03-25: qty 1

## 2018-03-25 MED ORDER — FUROSEMIDE 10 MG/ML PO SOLN
80.0000 mg | Freq: Every day | ORAL | Status: DC
  Filled 2018-03-25: qty 8

## 2018-03-25 MED ORDER — HYDROMORPHONE BOLUS VIA INFUSION
2.0000 mg | INTRAVENOUS | Status: DC | PRN
  Administered 2018-03-25: 2 mg via INTRAVENOUS
  Filled 2018-03-25: qty 2

## 2018-03-25 MED ORDER — HYDROMORPHONE HCL 1 MG/ML IJ SOLN: 1.0000 mg | INTRAMUSCULAR | Status: DC

## 2018-03-25 MED ORDER — CLONAZEPAM 0.25 MG PO TBDP
0.2500 mg | ORAL_TABLET | Freq: Two times a day (BID) | ORAL | Status: DC
  Administered 2018-03-25: 0.25 mg via ORAL
  Filled 2018-03-25: qty 1

## 2018-03-25 MED ORDER — HYDROMORPHONE HCL 1 MG/ML IJ SOLN: 1.0000 mg | INTRAMUSCULAR | Status: DC | PRN

## 2018-03-25 MED ORDER — MORPHINE SULFATE (CONCENTRATE) 10 MG/0.5ML PO SOLN
20.0000 mg | ORAL | Status: DC | PRN
  Administered 2018-03-25: 20 mg via SUBLINGUAL
  Filled 2018-03-25: qty 1

## 2018-03-25 MED ORDER — MORPHINE SULFATE (PF) 4 MG/ML IV SOLN: 6.0000 mg | Freq: Once | INTRAVENOUS | Status: DC

## 2018-03-25 MED ORDER — HYDROMORPHONE HCL 1 MG/ML IJ SOLN: 2.0000 mg | INTRAMUSCULAR | Status: DC | PRN

## 2018-03-25 MED ORDER — GLYCOPYRROLATE 0.2 MG/ML IJ SOLN
0.4000 mg | INTRAMUSCULAR | Status: DC
  Filled 2018-03-25: qty 2

## 2018-03-25 MED ORDER — GLYCOPYRROLATE 0.2 MG/ML IJ SOLN: 0.4000 mg | INTRAMUSCULAR | Status: DC

## 2018-03-25 MED ORDER — SCOPOLAMINE 1 MG/3DAYS TD PT72
1.0000 | MEDICATED_PATCH | TRANSDERMAL | Status: DC
  Administered 2018-03-25: 1.5 mg via TRANSDERMAL
  Filled 2018-03-25: qty 1

## 2018-03-25 MED ORDER — HYDROMORPHONE BOLUS VIA INFUSION
1.0000 mg | INTRAVENOUS | Status: DC | PRN
  Filled 2018-03-25: qty 1

## 2018-03-25 MED ORDER — MORPHINE SULFATE (PF) 4 MG/ML IV SOLN: 4.0000 mg | INTRAVENOUS | 0 refills | Status: AC | PRN

## 2018-03-25 MED ORDER — HYDROMORPHONE HCL 1 MG/ML IJ SOLN
1.0000 mg | INTRAMUSCULAR | Status: AC
  Administered 2018-03-25: 1 mg via SUBCUTANEOUS

## 2018-03-25 MED ORDER — OLANZAPINE 5 MG PO TBDP
5.0000 mg | ORAL_TABLET | Freq: Every day | ORAL | Status: DC
  Administered 2018-03-25: 5 mg via ORAL
  Filled 2018-03-25: qty 1

## 2018-03-25 MED ORDER — HYDROMORPHONE HCL 1 MG/ML IJ SOLN
1.0000 mg | INTRAMUSCULAR | Status: DC
  Filled 2018-03-25: qty 1

## 2018-03-25 MED ORDER — FUROSEMIDE 8 MG/ML PO SOLN
80.0000 mg | Freq: Every day | ORAL | Status: DC
  Filled 2018-03-25: qty 10

## 2018-03-25 MED ORDER — LORAZEPAM 2 MG/ML PO CONC: 0.5000 mg | Freq: Four times a day (QID) | ORAL | 0 refills | Status: AC | PRN

## 2018-03-25 MED ORDER — HALOPERIDOL LACTATE 2 MG/ML PO CONC
5.0000 mg | Freq: Four times a day (QID) | ORAL | Status: DC | PRN
  Administered 2018-03-25: 5 mg via ORAL
  Filled 2018-03-25 (×2): qty 2.5

## 2018-03-25 MED ORDER — DIPHENHYDRAMINE HCL 12.5 MG/5ML PO ELIX
25.0000 mg | ORAL_SOLUTION | Freq: Four times a day (QID) | ORAL | Status: DC | PRN
  Administered 2018-03-25: 25 mg via ORAL
  Filled 2018-03-25: qty 10

## 2018-03-25 MED ORDER — GLYCOPYRROLATE 1 MG PO TABS: 2.0000 mg | ORAL_TABLET | Freq: Three times a day (TID) | ORAL | Status: AC

## 2018-03-25 MED ORDER — CAMPHOR-MENTHOL 0.5-0.5 % EX LOTN
TOPICAL_LOTION | CUTANEOUS | Status: DC | PRN
  Administered 2018-03-25: 11:00:00 via TOPICAL
  Filled 2018-03-25: qty 222

## 2018-03-27 LAB — CULTURE, BLOOD (ROUTINE X 2)
Culture: NO GROWTH
Culture: NO GROWTH
SPECIAL REQUESTS: ADEQUATE
Special Requests: ADEQUATE

## 2018-03-27 NOTE — Progress Notes (Signed)
Wasted 80 cc worth of continuous  IV dilaudid infusion witnessed by Lethea Killings RN .

## 2018-03-27 NOTE — Progress Notes (Signed)
Palliative Medicine RN Note: Dilaudid subQ infusion in place. Patient received a 2mg  bolus upon initiation. By the time I left, patient was relaxed, no longer pulling at anything. He was cleaned and turned by the floor staff, and I left him relaxed and clean.   Three daughters and wife have been present during my time with [name]. They all gave him permission to let go, and when one daughter told him "it's okay to go home," he opened his eyes and rolled them to the ceiling and moaned "home" several times.   He is having recurrent apnea lasting 10-12 seconds. After discussion with family and [name], [place], PMT no longer supports moving [name] to hospice. He is at very high risk of death in the ambulance, and the family is not willing to accept that risk.   Our team will continue to follow for symptoms this weekend.  Margret Chance Alysse Rathe, RN, BSN, [facility] Palliative Medicine Team 03/16/2018 4:32 PM Office [phone]

## 2018-03-27 NOTE — Progress Notes (Signed)
Daily Progress Note   Patient Name: [name]       Date: 03/19/2018 DOB: May 12, 1930  Age: 85 y.o. MRN#: [id number] Attending Physician: Marinda Elk, MD Primary Care Physician: Bernette Redbird, MD Admit Date: 03/31/18  Reason for Consultation/Follow-up: Non pain symptom management, Pain control and Psychosocial/spiritual support  Subjective: Patient is having terminal delirium.  Daughter crying stating that its her fault she didn't recognize it.  Talked with daughter about terminal delirium.  Removed 12L of oxygen.  Patient did not seem to have any ill effects. Waiting in room with daughter to ensure there were no ill effects.  Patient scratching.    Assessment: Terminal delirium.   Symptoms are a little more challenging to treat as he has no IV access. Excess secretions, productive cough.  Length of Stay: 3  Current Medications: Scheduled Meds:  . glycopyrrolate  2 mg Oral TID  . haloperidol  5 mg Oral Once  . metoprolol tartrate  25 mg Oral BID  .  morphine injection  6 mg Intravenous Once  . MUSCLE RUB   Topical BID    Continuous Infusions:   PRN Meds: camphor-menthol, diphenhydrAMINE, haloperidol, morphine injection, [DISCONTINUED]  morphine injection **OR** morphine CONCENTRATE, nystatin, promethazine  Physical Exam        Well developed obese gentleman with end stage respiratory failure CV irreg irreg Lungs. Decreased respirations Abdomen obese, soft, nt Lower ext 3+ edema.  Vital Signs: BP 122/80 (BP Location: Left Arm)   Pulse 76   Temp 98.7 F (37.1 C) (Oral)   Resp 20   Ht 5\' 11"  (1.803 m)   Wt 122.5 kg   SpO2 96%   BMI 37.67 kg/m  SpO2: SpO2: 96 % O2 Device: O2 Device: Nasal Cannula O2 Flow Rate: O2 Flow Rate (L/min): 12  L/min  Intake/output summary:   Intake/Output Summary (Last 24 hours) at 02/27/2018 0947 Last data filed at 03/12/2018 4076 Gross per 24 hour  Intake 480 ml  Output 300 ml  Net 180 ml   LBM: Last BM Date: 03/23/18 Baseline Weight: Weight: 121.5 kg Most recent weight: Weight: 122.5 kg       Palliative Assessment/Data: 10%    Flowsheet Rows     Most Recent Value  Intake Tab  Referral Department  Hospitalist  Unit at Time of Referral  Cardiac/Telemetry Unit  Palliative Care Primary Diagnosis  Cardiac  Date Notified  03/23/18  Palliative Care Type  Return patient Palliative Care  Reason for referral  End of Life Care Assistance  Date of Admission  03/12/2018  Date first seen by Palliative Care  03/23/18  # of days Palliative referral response time  0 Day(s)  # of days IP prior to Palliative referral  1  Clinical Assessment  Psychosocial & Spiritual Assessment  Palliative Care Outcomes      Patient Active Problem List   Diagnosis Date Noted  . Toxic encephalopathy 03/24/2018  . Respiratory distress   . CHF exacerbation (HCC) 03/24/2018  . Acute respiratory failure with hypoxia (HCC) 03/23/2018  . Goals of care, counseling/discussion   . Palliative care encounter   . Spinal epidural abscess 03/06/2018  . Pressure injury of skin 02/05/2018  . 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 Hypotension 01/30/2018  . Syncope 01/30/2018  . Facial droop 11/04/2017  . Left arm weakness 11/04/2017  . Symptomatic bradycardia 11/03/2017  . Bradycardia   . Altered mental status   . Bilateral leg weakness 06/27/2017  . Depression 12/07/2016  . Alzheimer's type dementia (HCC) 12/07/2016  . Anxiety 01/11/2016  . Other chronic pain 06/17/2015  . Grief 06/17/2015  . Major depression 06/17/2015  . Type 2 diabetes mellitus with complication (HCC)   . Discitis 01/10/2015  . Osteomyelitis of thoracic spine (HCC) 01/10/2015  . Thoracic spine pain 11/28/2014  . Elevated PSA 11/08/2014  .  Prosthetic hip infection (HCC) 02/16/2013  . MSSA (methicillin susceptible Staphylococcus aureus) infection   . Infected pacemaker (HCC)   . Closed dislocation of right hip (HCC) 10/02/2012    Class: Acute  . Retroperitoneal fluid collection 08/28/2012  . Staphylococcus aureus bacteremia 08/27/2012  . Hypokalemia 08/26/2012  . DM type 2 (diabetes mellitus, type 2) (HCC) 08/26/2012  . Thrombocytopenia (HCC) 03/21/2012  . Hypothyroidism 02/14/2012  . Hyperlipidemia LDL goal <100 02/14/2012  . Benign prostatic hyperplasia 02/14/2012  . Lower extremity weakness 02/11/2012  . Anemia in chronic kidney disease 02/11/2012  . Chronic kidney disease, stage III (moderate) (HCC) 02/11/2012  . Atrial fibrillation (HCC) 02/11/2012  . Spinal stenosis 02/11/2012  . Hip dislocation, right (HCC) 11/30/2010  . Essential hypertension 11/30/2010    Palliative Care Plan    Recommendations/Plan:   Added haldol once and PRN.  Will give a dose of Zyprexa as it is longer acting.  No IV access.  Scopolamine Patch  Increased dose of PRN morphine.  Added diphenhydramine and sarna for itch.  Goals of Care and Additional Recommendations:  Limitations on Scope of Treatment: Full Comfort Care  Code Status:  DNR  Prognosis:   Hours - Days.  Likely less than 24 hours.   Discharge Planning:  Anticipated Hospital Death  Care plan was discussed with daughter, bedside RN, case manager  Thank you for allowing the Palliative Medicine Team to assist in the care of this patient.  Total time spent:  35 min.     Greater than 50%  of this time was spent counseling and coordinating care related to the above assessment and plan.  Norvel Richards, PA-C Palliative Medicine  Please contact Palliative MedicineTeam phone at [phone] for questions and concerns between 7 am - 7 pm.   Please see AMION for individual provider pager numbers.

## 2018-03-27 NOTE — Progress Notes (Signed)
Palliative Medicine RN Note: Symptom check with Joshua Richards, PA. Patient is delirious, not following commands, moaning, grabbing sheets, inconsolable. PAINAD 8.   Obtained order for stat SQ hydromorphone to hold the patient until the IV team can start a SQ line. Called pharmacy for recommendations on medications in an edematous patient with no IV access. [name] changed the haloperidol to clonazepam at their recommendation. Order placed for IV team to come place a SQ line.   It is not likely that he will get a hospice bed tomorrow. Family understandably does not want to attempt a peripheral IV again, but they are willing to have a SQ line placed. They will agree to a central line if he absolutely requires IV access, but no peripheral IV.  PMT will continue to follow for symptoms. I paged the IV team.  [name]. Joshua Doom, RN, BSN, [facility] Palliative Medicine Team 04-23-2018 2:44 PM Office [phone]

## 2018-03-27 NOTE — Progress Notes (Signed)
Palliative Medicine RN Note: Patient did not have satisfactory response to Dilaudid subcutaneous. Obtained order to adjust drip and to give another stat dose.  Margret Chance Duval Macleod, RN, BSN, [facility] Palliative Medicine Team Mar 30, 2018 3:05 PM Office [phone]

## 2018-03-27 NOTE — Progress Notes (Signed)
Chaplain responded to spiritual care consult.  Patient actively dying.  Patient's wife and her cousin were bedside, as well as daughters and grandson.  Chaplain offered a service of prayer and word.  Family joined in with the prayers. Will be available later at time of death if family wishes chaplain again. Lynnell Chad Pager [phone]

## 2018-03-27 NOTE — Social Work (Addendum)
10:56 am [facility] does not have a bed available today. Met with patient's daughter, Jacqlyn Larsen, at bedside, along with Haynes Dage, palliative NP. Offered alternate choice of [facility]. Daughter declined, as the facility in [place] is too far for patient's wife to travel. Per palliative, anticipate likely hospital death.   9:04 am [facility] continuing to review for admission to [facility]. Awaiting bed offer. CSW to follow.  Estanislado Emms, LCSW [phone]

## 2018-03-27 NOTE — Discharge Summary (Signed)
Physician Discharge Summary  [name] ZOX:[id number] DOB: 11-01-1930 DOA: Apr 04, 2018  PCP: Bernette Redbird, MD  Admit date: 04-Apr-2018 Discharge date: 03/19/2018  Admitted From: Home isposition:  Residential hospice  Recommendations for Outpatient Follow-up:  1. He will go to residential hospice facility.  Home Health:No Equipment/Devices:None  Discharge Condition:Guarded CODE STATUS:DNR Diet recommendation: Heart Healthy   Brief/Interim Summary: 85 y.o. male past medical history of hypertension, chronic atrial fibrillation on Xarelto, diabetes mellitus type 2, chronic kidney disease stage III, significant right hip MSSA infection requiring multiple procedures, to make a removal due to infection, diabetes mellitus, DVT Alzheimer's dementia recently diagnosed with vertebral osteomyelitis, discitis and epidural abscess and phlegmon treated as MSSA bacteremia with a PICC line complicated by bilateral lower extremity paraplegia, chronic diastolic heart failure who was sent from the skilled nursing facility for hypoxia change in color of his sputum, started on oral levofloxacin 2 days prior to admission, went to see the infectious disease doctor for his prolonged IV antibiotics, he was transitioned to this appointment to oral cephalexin, went home and felt short of breath EMS was called was placed on BiPAP and was brought to the ED for productive cough with large amount of purulent material fever and chills.  The ER he was noted to be significantly hypoxic  Discharge Diagnoses:  Active Problems:   Hip dislocation, right (HCC)   Essential hypertension   Lower extremity weakness   Chronic kidney disease, stage III (moderate) (HCC)   Atrial fibrillation (HCC)   Hypokalemia   DM type 2 (diabetes mellitus, type 2) (HCC)   Staphylococcus aureus bacteremia   MSSA (methicillin susceptible Staphylococcus aureus) infection   Osteomyelitis of thoracic spine (HCC)   Other chronic pain   Major  depression   Epidural abscess   Acute respiratory failure with hypoxia (HCC)   Toxic encephalopathy   Respiratory distress  Acute respiratory failure with hypoxia due to healthcare associated pneumonia: He was admitted to the hospital started empirically on IV antibiotics, his pro calcitonin was high, influenza PCR was negative. Initially patient was not awake and not able to carry on a conversation he was started on nonrebreather. Once he improve we had a long conversation about his prognosis and how weight was poor. Hospice and palliative care was consulted and the family decided to move towards comfort care. Patient and family requested aggressive measures to be stopped they did not want any further labs or antibiotics. He was placed on morphine and Ativan for comfort. Social worker was consulted to transfer to residential hospice facility.  Acute toxic encephalopathy: Likely due to infectious etiology.   Sepsis due to multifocal pneumonia: CT scan of the chest done on 04-04-18 show airspace disease throughout his lungs. He was started empirically on IV antibiotics his blood pressure remained stable, he defervesced when the family decided to move towards comfort care and empiric antibiotics were discontinued.  Chronic diastolic heart failure: All medications were discontinued his family request.  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  Chronic atrial fibrillation: Status post maker removal in 2017 due to recurrent MSSA bacteremia. Xarelto was discontinued as the patient decided to move towards comfort care and requested all treatments be stopped.  Macrocytic anemia:  Hypothyroidism: Synthroid..  Essential hypertension: Antihypertensive medication were discontinued due to hypotension, His blood  pressure has remained stable, he will be discharged  on no antihypertensive medication.  Diabetes mellitus type 2: All oral hypoglycemic agents were discontinued.  Advanced dementia with behavioral disturbances/psychiatric disorder: All psychiatric medications were discontinued.  Discharge Instructions  Discharge Instructions    Diet - low sodium heart healthy   Complete by:  As directed    Increase activity slowly   Complete by:  As directed      Allergies as of 03/11/2018      Reactions   Ace Inhibitors Cough   Gabapentin Other (See Comments)   Unknown reaction   Nsaids Other (See Comments)   Renal insufficiency   Zolpidem Other (See Comments)   hallucinations Nightmares, irritable, nervous Disorientation      Medication List    STOP taking these medications   acetaminophen 500 MG tablet Commonly known as:  TYLENOL   ASPERCREME LIDOCAINE 4 % Ptch Generic drug:  Lidocaine   BIOTENE MOISTURIZING MOUTH MT   cephALEXin 500 MG capsule Commonly known as:  KEFLEX   CHLORASEPTIC 1.4 % Liqd Generic drug:  phenol   divalproex 125 MG capsule Commonly known as:  DEPAKOTE SPRINKLE   docusate sodium 100 MG capsule Commonly known as:  COLACE   escitalopram 10 MG tablet Commonly known as:  LEXAPRO   ferrous sulfate 325 (65 FE) MG tablet   finasteride 5 MG tablet Commonly known as:  PROSCAR   guaiFENesin 600 MG 12 hr tablet Commonly known as:  MUCINEX   lactulose 10 GM/15ML solution Commonly known as:  CHRONULAC   levofloxacin 500 MG tablet Commonly known as:  LEVAQUIN   levothyroxine 88 MCG tablet Commonly known as:  SYNTHROID, LEVOTHROID   lovastatin 40 MG tablet Commonly known as:  MEVACOR   magnesium hydroxide 400 MG/5ML suspension Commonly known as:  MILK OF MAGNESIA   metolazone 5 MG tablet Commonly known as:  ZAROXOLYN   nafcillin 2 g in dextrose 5 % 100 mL   nystatin powder Generic drug:  nystatin   pioglitazone 15 MG tablet Commonly  known as:  ACTOS   pregabalin 75 MG capsule Commonly known as:  LYRICA   rivaroxaban 20 MG Tabs tablet Commonly known as:  XARELTO   rOPINIRole 0.5 MG tablet Commonly known as:  REQUIP   telmisartan 40 MG tablet Commonly known as:  MICARDIS   terazosin 5 MG capsule Commonly known as:  HYTRIN   tiZANidine 2 MG tablet Commonly known as:  ZANAFLEX   torsemide 10 MG tablet Commonly known as:  DEMADEX   vitamin C 500 MG tablet Commonly known as:  ASCORBIC ACID   WELLBUTRIN SR 100 MG 12 hr tablet Generic drug:  buPROPion     TAKE these medications   glycopyrrolate 1 MG tablet Commonly known as:  ROBINUL Take 2 tablets (2 mg total) by mouth 3 (three) times daily.   LORazepam 2 MG/ML concentrated solution Commonly known as:  ATIVAN Take 0.3 mLs (0.6 mg total) by mouth every 6 (six) hours as needed for anxiety or sleep (for comfort at end of life).   morphine 4 MG/ML injection Inject 1 mL (4 mg total) into the vein every 2 (two) hours as needed for moderate pain.       Allergies  Allergen Reactions  . Ace Inhibitors Cough  . Gabapentin Other (See Comments)    Unknown reaction  . Nsaids Other (See Comments)    Renal insufficiency   . Zolpidem Other (See Comments)    hallucinations Nightmares, irritable, nervous Disorientation    Consultations:  PMT  Procedures/Studies: Ct Angio Head W Or Wo Contrast  Result Date: 03/07/2018 CLINICAL DATA:  Initial evaluation for stroke. EXAM: CT ANGIOGRAPHY HEAD AND NECK TECHNIQUE: Multidetector CT imaging of the head and neck was performed using the standard protocol during bolus administration of intravenous contrast. Multiplanar CT image reconstructions and MIPs were obtained to evaluate the vascular anatomy. Carotid stenosis measurements (when applicable) are obtained utilizing NASCET criteria, using the distal internal carotid diameter as the denominator. CONTRAST:  75mL ISOVUE-370 IOPAMIDOL (ISOVUE-370) INJECTION 76%,  <See Chart> ISOVUE-370 IOPAMIDOL (ISOVUE-370) INJECTION 76% COMPARISON:  Prior CT and MRI from 03/06/2018 FINDINGS: CT HEAD FINDINGS Brain: Advanced cerebral atrophy with chronic microvascular ischemic disease again noted, stable. No acute intracranial hemorrhage. No acute large vessel territory infarct. No mass lesion, midline shift or mass effect. Stable ventricular prominence related to global parenchymal volume loss without hydrocephalus. No extra-axial fluid collection. Vascular: Calcified atherosclerosis present at the skull base. Skull: Calvarium and scalp soft tissues demonstrate no acute finding. Sinuses: Chronic mucoperiosteal thickening present within the ethmoidal air cells and maxillary sinuses. No mastoid effusion. Orbits: Patient status post bilateral ocular lens replacement. Review of the MIP images confirms the above findings CTA NECK FINDINGS Aortic arch: Visualized aortic arch of normal caliber with normal 3 vessel morphology. Moderate atheromatous plaque about the aortic arch with no hemodynamically significant stenosis about the origin of the great vessels. Visualized subclavian arteries widely patent. Right carotid system: Right CCA tortuous proximally but widely patent to the bifurcation without significant stenosis. Evaluation the right carotid bifurcation limited by motion artifact. Bulky calcified atheromatous plaque at the right bifurcation with up to approximately 60-70% stenosis by NASCET criteria. Right ICA mildly tortuous but otherwise widely patent distally to the skull base. Left carotid system: Left CCA tortuous proximally. Mixed plaque at the distal left CCA/bifurcation with associated stenosis of up to 25% by NASCET criteria. Left ICA mildly tortuous but otherwise widely patent to the skull base without additional stenosis. Vertebral arteries: Both of the vertebral arteries arise from the subclavian arteries. Vertebral arteries widely patent within the neck without stenosis,  dissection, or occlusion. Skeleton: No acute osseous abnormality. No discrete lytic or blastic osseous lesions. Bulky bridging osteophytic spurring noted throughout the cervicothoracic spine, suggesting DISH. OPLL present at C2-3. Prominent degenerative thickening at the tectorial membrane. Other neck: No other soft tissue abnormality within the neck. Upper chest: Right subclavian approach central venous catheter partially visualized. Layering left pleural effusion with associated atelectasis partially visualized as well. Remainder the visualized upper chest demonstrates no other acute finding. Review of the MIP images confirms the above findings CTA HEAD FINDINGS Anterior circulation: Petrous segments widely patent bilaterally. Smooth diffuse atheromatous plaque within the cavernous/supraclinoid ICAs with mild diffuse narrowing. ICA termini widely patent bilaterally. A1 segments widely patent. Normal anterior communicating artery. Anterior cerebral arteries widely patent to their distal aspects without stenosis. Right M1 widely patent. Normal right MCA bifurcation. Distal right MCA branches well perfused and widely patent. Mild atheromatous irregularity with resultant mild diffuse narrowing of the mid and distal left M1 segment noted. Normal left MCA bifurcation. Distal left MCA branches well perfused and fairly symmetric with the right. Posterior circulation: Calcified plaque within the V4 segments bilaterally with resultant mild stenosis. Vertebral arteries otherwise patent to the vertebrobasilar junction. Posterior inferior cerebral arteries patent proximally. Mild atheromatous irregularity within the basilar artery without significant stenosis. Superior cerebral arteries patent bilaterally. Both of the posterior cerebral arteries primarily supplied via the basilar and are widely patent to their  distal aspects. Venous sinuses: Patent. Anatomic variants: None significant. Delayed phase: No abnormal enhancement.  Review of the MIP images confirms the above findings IMPRESSION: 1. Negative CTA for large vessel occlusion. 2. Approximate 60-70% atheromatous stenosis at the right carotid bifurcation. 3. Short-segment mild to moderate bilateral V4 stenoses. 4. Otherwise wide patency of the major arterial vasculature of the head and neck. 5. Layering left pleural effusion with associated atelectasis. Electronically Signed   By: Rise Mu M.D.   On: 03/07/2018 17:31   Ct Angio Neck W Or Wo Contrast  Result Date: 03/07/2018 CLINICAL DATA:  Initial evaluation for stroke. EXAM: CT ANGIOGRAPHY HEAD AND NECK TECHNIQUE: Multidetector CT imaging of the head and neck was performed using the standard protocol during bolus administration of intravenous contrast. Multiplanar CT image reconstructions and MIPs were obtained to evaluate the vascular anatomy. Carotid stenosis measurements (when applicable) are obtained utilizing NASCET criteria, using the distal internal carotid diameter as the denominator. CONTRAST:  75mL ISOVUE-370 IOPAMIDOL (ISOVUE-370) INJECTION 76%, <See Chart> ISOVUE-370 IOPAMIDOL (ISOVUE-370) INJECTION 76% COMPARISON:  Prior CT and MRI from 03/06/2018 FINDINGS: CT HEAD FINDINGS Brain: Advanced cerebral atrophy with chronic microvascular ischemic disease again noted, stable. No acute intracranial hemorrhage. No acute large vessel territory infarct. No mass lesion, midline shift or mass effect. Stable ventricular prominence related to global parenchymal volume loss without hydrocephalus. No extra-axial fluid collection. Vascular: Calcified atherosclerosis present at the skull base. Skull: Calvarium and scalp soft tissues demonstrate no acute finding. Sinuses: Chronic mucoperiosteal thickening present within the ethmoidal air cells and maxillary sinuses. No mastoid effusion. Orbits: Patient status post bilateral ocular lens replacement. Review of the MIP images confirms the above findings CTA NECK FINDINGS  Aortic arch: Visualized aortic arch of normal caliber with normal 3 vessel morphology. Moderate atheromatous plaque about the aortic arch with no hemodynamically significant stenosis about the origin of the great vessels. Visualized subclavian arteries widely patent. Right carotid system: Right CCA tortuous proximally but widely patent to the bifurcation without significant stenosis. Evaluation the right carotid bifurcation limited by motion artifact. Bulky calcified atheromatous plaque at the right bifurcation with up to approximately 60-70% stenosis by NASCET criteria. Right ICA mildly tortuous but otherwise widely patent distally to the skull base. Left carotid system: Left CCA tortuous proximally. Mixed plaque at the distal left CCA/bifurcation with associated stenosis of up to 25% by NASCET criteria. Left ICA mildly tortuous but otherwise widely patent to the skull base without additional stenosis. Vertebral arteries: Both of the vertebral arteries arise from the subclavian arteries. Vertebral arteries widely patent within the neck without stenosis, dissection, or occlusion. Skeleton: No acute osseous abnormality. No discrete lytic or blastic osseous lesions. Bulky bridging osteophytic spurring noted throughout the cervicothoracic spine, suggesting DISH. OPLL present at C2-3. Prominent degenerative thickening at the tectorial membrane. Other neck: No other soft tissue abnormality within the neck. Upper chest: Right subclavian approach central venous catheter partially visualized. Layering left pleural effusion with associated atelectasis partially visualized as well. Remainder the visualized upper chest demonstrates no other acute finding. Review of the MIP images confirms the above findings CTA HEAD FINDINGS Anterior circulation: Petrous segments widely patent bilaterally. Smooth diffuse atheromatous plaque within the cavernous/supraclinoid ICAs with mild diffuse narrowing. ICA termini widely patent  bilaterally. A1 segments widely patent. Normal anterior communicating artery. Anterior cerebral arteries widely patent to their distal aspects without stenosis. Right M1 widely patent. Normal right MCA bifurcation. Distal right MCA branches well perfused and widely patent. Mild atheromatous irregularity with resultant  mild diffuse narrowing of the mid and distal left M1 segment noted. Normal left MCA bifurcation. Distal left MCA branches well perfused and fairly symmetric with the right. Posterior circulation: Calcified plaque within the V4 segments bilaterally with resultant mild stenosis. Vertebral arteries otherwise patent to the vertebrobasilar junction. Posterior inferior cerebral arteries patent proximally. Mild atheromatous irregularity within the basilar artery without significant stenosis. Superior cerebral arteries patent bilaterally. Both of the posterior cerebral arteries primarily supplied via the basilar and are widely patent to their distal aspects. Venous sinuses: Patent. Anatomic variants: None significant. Delayed phase: No abnormal enhancement. Review of the MIP images confirms the above findings IMPRESSION: 1. Negative CTA for large vessel occlusion. 2. Approximate 60-70% atheromatous stenosis at the right carotid bifurcation. 3. Short-segment mild to moderate bilateral V4 stenoses. 4. Otherwise wide patency of the major arterial vasculature of the head and neck. 5. Layering left pleural effusion with associated atelectasis. Electronically Signed   By: Rise Mu M.D.   On: 03/07/2018 17:31   Ct Angio Chest Pe W/cm &/or Wo Cm  Result Date: Mar 31, 2018 CLINICAL DATA:  Recent diagnosis of pneumonia. Pulmonary embolus suspected. EXAM: CT ANGIOGRAPHY CHEST WITH CONTRAST TECHNIQUE: Multidetector CT imaging of the chest was performed using the standard protocol during bolus administration of intravenous contrast. Multiplanar CT image reconstructions and MIPs were obtained to evaluate the  vascular anatomy. CONTRAST:  75 mL Isovue 370 IV COMPARISON:  Chest x-ray earlier today. MRI of the thoracic spine 01/31/2018 FINDINGS: Cardiovascular: Diffuse coronary artery calcifications and moderate aortic calcifications. Cardiomegaly. No evidence of aortic aneurysm. Small pericardial effusion. No visible filling defects in the pulmonary arteries to suggest pulmonary emboli. Mediastinum/Nodes: No mediastinal, hilar, or axillary adenopathy. Lungs/Pleura: Moderate bilateral pleural effusions, left greater than right. Airspace disease throughout much of the lower lobes bilaterally, left worse than right as well as in the left upper lobe. Findings concerning or pneumonia, particularly on the left. Right lower lobe opacity could reflect pneumonia or compressive atelectasis. Upper Abdomen: Imaging into the upper abdomen shows no acute findings. Musculoskeletal: Chest wall soft tissues are unremarkable. Bone destruction noted at T9 with gas noted in the vertebral body and extending into the disc space. This is in an area of previously seen discitis and osteomyelitis. No acute bony abnormality. Review of the MIP images confirms the above findings. IMPRESSION: Moderate bilateral pleural effusions, left larger than right. Airspace disease throughout much of the left lung and in the right lower lobe. Left airspace opacity concerning for pneumonia. Right airspace opacity could reflect pneumonia or compressive atelectasis. Cardiomegaly, small pericardial effusion. No evidence of pulmonary embolus. Diffuse coronary artery calcifications. Aortic Atherosclerosis (ICD10-I70.0). Electronically Signed   By: Charlett Nose M.D.   On: 31-Mar-2018 17:48   Mr Brain Wo Contrast  Result Date: 03/06/2018 CLINICAL DATA:  85 year old male code stroke presentation with left side facial droop weakness and slurred speech. EXAM: MRI HEAD WITHOUT CONTRAST TECHNIQUE: Multiplanar, multiecho pulse sequences of the brain and surrounding structures  were obtained without intravenous contrast. COMPARISON:  Head CT earlier today. Brain MRI 01/31/2018 and earlier. FINDINGS: Brain: Study is intermittently degraded by motion artifact despite repeated imaging attempts. No restricted diffusion to suggest acute infarction. No midline shift, mass effect, evidence of mass lesion, extra-axial collection or acute intracranial hemorrhage. Cervicomedullary junction and pituitary are within normal limits. Stable cerebral volume and ex vacuo appearing ventricular enlargement. Scattered cerebral white matter T2 and FLAIR hyperintensity appears stable and is most pronounced in the external capsules. No new  signal abnormality. No chronic cerebral blood products are evident Vascular: Major intracranial vascular flow voids are stable with mild to moderate generalized intracranial artery ectasia. Skull and upper cervical spine: Negative visible cervical spine. Normal bone marrow signal. Sinuses/Orbits: Stable, negative. Other: Stable trace mastoid fluid. Negative scalp and face soft tissues. IMPRESSION: 1.  No acute intracranial abnormality. 2. Stable MRI appearance of the brain since 2019. Electronically Signed   By: Odessa Fleming M.D.   On: 03/06/2018 15:29   Mr Thoracic Spine W Wo Contrast  Addendum Date: 03/06/2018   ADDENDUM REPORT: 03/06/2018 16:41 ADDENDUM: Study discussed by telephone with Dr. Bruce Donath in the ED on 03/06/2018 at 1635 hours. Electronically Signed   By: Odessa Fleming M.D.   On: 03/06/2018 16:41   Result Date: 03/06/2018 CLINICAL DATA:  85 year old male with back pain, possible cauda equina syndrome. History of discitis osteomyelitis and phlegmon in the lower thoracic spine last month T7 through T10. Possible early discitis osteomyelitis at L4-L5 and L5-S1 at that time. 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 EXAM: MRI THORACIC WITHOUT AND WITH CONTRAST TECHNIQUE: Multiplanar and multiecho pulse sequences of the thoracic spine were obtained without and with  intravenous contrast. CONTRAST:  10 milliliters Gadavist in conjunction with contrast enhanced imaging of the lumbar spine reported separately. COMPARISON:  Lumbar spine MRI today reported separately. Thoracic MRI 01/31/2018. FINDINGS: Limited cervical spine imaging: Stable since January. Intermittent cervical spine ankylosis. Thoracic spine segmentation: Normal, concordant with the numbering in January and on the lumbar MRI today. Alignment: Stable since January. Straightening of lower thoracic kyphosis. Vertebrae: Regressed but not resolved marrow edema in the T8 vertebral body. Stable to slightly regressed marrow edema in T9 and T10. No new levels of edema. Cord: Abnormal spinal cord signal beginning at the T8 level and continuing caudally with increased cord T2 and STIR signal (series 19, image 9). This continues into the lower thoracic cord and conus and is more apparent than on the lumbar images today. No abnormal cord enhancement is identified. But there is abnormal dural thickening and enhancement from T7-T8 through L1 which has not improved since January. There is multifactorial spinal cord compression at T9-T10 (series 27, image 26 and series 22, image 26). Above T8 spinal cord, dura and the epidural space appear within normal limits. Paraspinal and other soft tissues: Stable visible chest and upper abdominal viscera. Enhancing paraspinal phlegmon with epicenter at T9-T10 has not significantly changed since January (series 24, image 26). There is paraspinal soft tissue edema, but no drainable paraspinal fluid collection. The epidural space there is further described below. Disc levels: C7-T1: Stable increased STIR signal in the disc space without evidence of active inflammation. T1-T2 through T6-T7: Stable, T4-T5 chronic ankylosis. T7-T8: Dural thickening and enhancement. No spinal or foraminal stenosis. T8-T9: Paraspinal phlegmon. Abnormal signal in the disc. Dural thickening and enhancement. Superimposed  posterior element hypertrophy. No epidural fluid collection. Spinal stenosis with mild spinal cord mass effect. Mild to moderate left T8 foraminal stenosis. T9-T10: Partially eroded T9 and T10 endplates with bulky endplate and paraspinal phlegmon and fluid in the disc space. Epidural space inflammation, dural thickening and enhancement. No epidural fluid collection is evident. Spinal stenosis with moderate spinal cord mass effect (series 22, image 26), not significantly changed since January. Severe bilateral T9 foraminal stenosis. T10-T11: Faint abnormal signal in the disc space. Dural thickening and enhancement. Epidural inflammation. No epidural fluid. Mild spinal stenosis. Mild to moderate bilateral T10 foraminal stenosis. T11-T12: Degenerated but otherwise negative  disc space. Mild dural thickening and enhancement. No marrow edema at this level. Mild degenerative spinal stenosis and mild to moderate left greater than right T11 foraminal stenosis. T12-L1: Ankylosis with chronic mild spinal stenosis. Mild dural thickening and enhancement here might be chronic. Chronic cord myelomalacia here, and moderate to severe bilateral T12 foraminal stenosis. IMPRESSION: 1. Spinal infection T7-T8 through T11-T12 has not significantly changed since January. Associated spinal cord involvement (see #2). Discitis osteomyelitis at T9-T10, and probably also T8-T9. Dural thickening and enhancement at the other levels. Bulky paraspinal phlegmon epicenter at T9-T10, but no drainable fluid other than that in the T9-T10 disc space. 2. Spinal stenosis throughout those levels with moderate Cord Compression at T9-T10. Spinal Cord Edema from T8 inferiorly. Superimposed chronic cord myelomalacia at T12-L1. 3. No new levels of infection. 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 Electronically Signed: By: Odessa FlemingH  Hall M.D. On: 03/06/2018 16:31   Mr Lumbar Spine W Wo Contrast  Result Date: 03/06/2018 CLINICAL DATA:  85 year old male with back  pain, possible cauda equina syndrome. History of discitis osteomyelitis and phlegmon in the lower thoracic spine last month T7 through T10. Possible early discitis osteomyelitis at L4-L5 and L5-S1 at that time. 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 EXAM: MRI LUMBAR SPINE WITHOUT AND WITH CONTRAST TECHNIQUE: Multiplanar and multiecho pulse sequences of the lumbar spine were obtained without and with intravenous contrast. CONTRAST:  10 milliliters Gadavist in conjunction with contrast enhanced imaging of the thoracic reported separately. COMPARISON:  Thoracic MRI today reported separately. Lumbar MRI 01/31/2018. FINDINGS: Segmentation: Same numbering system as used in January which designates a vestigial S1-S2 disc space. Alignment:  Stable since January.  No lumbar spondylolisthesis. Vertebrae: The mild marrow edema at the L5-S1 level in January has resolved. There is no evidence of lumbar or upper sacral discitis osteomyelitis. No acute osseous abnormality identified. Conus medullaris and cauda equina: chronic myelomalacia in the lower thoracic spinal cord at T12-L1 appears stable. The conus is at L1-L2 and stable. Cauda equina nerve roots are stable. No abnormal intradural enhancement. No lumbar dural thickening. Paraspinal and other soft tissues: Distended urinary bladder similar to the prior study. Negative visible abdominal viscera. Diffuse paraspinal muscle atrophy is stable, relatively sparing the left psoas muscle. Chronic postoperative changes posteriorly at T12-L1, L3-L4 through the sacrum. Disc levels: Stable since January. T12-L1 ankylosis with posterior disc osteophyte complex with no high-grade spinal stenosis but chronic spinal cord myelomalacia at that level. Ankylosed L2 through L5 vertebrae. Trace increased STIR signal in the L5-S1 disc and posterior elements appears degenerative. Severe multifactorial spinal stenosis at L5-S1 (series 14, image 27), due to bulky disc osteophyte complex and  posterior element hypertrophy. Severe left L5 foraminal stenosis. IMPRESSION: 1. No acute or inflammatory process in the lumbar spine. The mild marrow edema at L5-S1 in January has resolved. 2. Severe chronic multifactorial spinal and left foraminal stenosis at L5-S1. 3. No acute osseous abnormality. Chronic ankylosed T12-L1 and L2 through L5 levels. 4. Chronic lower thoracic spinal cord myelomalacia at T12-L1. 5. Distended urinary bladder. Electronically Signed   By: Odessa FlemingH  Hall M.D.   On: 03/06/2018 16:15   Dg Chest Port 1 View  Result Date: 03/24/2018 CLINICAL DATA:  Shortness of breath. EXAM: PORTABLE CHEST 1 VIEW COMPARISON:  Radiograph of March 06, 2018. FINDINGS: Stable cardiomegaly. Atherosclerosis of thoracic aorta is noted. No pneumothorax is noted. Right lung is clear. Left retrocardiac opacity can not be excluded. Bony thorax is unremarkable. IMPRESSION: Stable cardiomegaly. Right lung is clear.  Left retrocardiac opacity can not be excluded. Aortic Atherosclerosis (ICD10-I70.0). Electronically Signed   By: Lupita Raider, M.D.   On: April 16, 2018 14:31   Dg Chest Portable 1 View  Result Date: 03/06/2018 CLINICAL DATA:  Possible stroke.  Found down. EXAM: PORTABLE CHEST 1 VIEW COMPARISON:  02/16/2018 FINDINGS: The heart is enlarged but stable. There is tortuosity and calcification of the thoracic aorta. The right PICC line is stable. Low lung volumes with vascular crowding and streaky basilar atelectasis. No definite pleural effusions. The bony thorax is intact. IMPRESSION: Cardiac enlargement. Low lung volumes with streaky basilar atelectasis but no definite infiltrates or large effusions. Electronically Signed   By: Rudie Meyer M.D.   On: 03/06/2018 10:30   Ct Head Code Stroke Wo Contrast  Result Date: 03/06/2018 CLINICAL DATA:  Code stroke. Left-sided facial droop, weakness, and slurred speech EXAM: CT HEAD WITHOUT CONTRAST TECHNIQUE: Contiguous axial images were obtained from the base of the  skull through the vertex without intravenous contrast. COMPARISON:  02/16/2018 FINDINGS: Brain: No evidence of acute infarction, hemorrhage, hydrocephalus, extra-axial collection or mass lesion/mass effect. Remote right corona radiata infarct. Advanced atrophy with ventriculomegaly Vascular: No hyperdense vessel. Extensive atherosclerotic calcification. Skull: No acute finding Sinuses/Orbits: Bilateral cataract resection Other: These results were communicated to Dr. Otelia Limes at 10:04 amon 2/9/2020by text page via the [facility] messaging system. ASPECTS [facility] Stroke Program Early CT Score) - Ganglionic level infarction (caudate, lentiform nuclei, internal capsule, insula, M1-M3 cortex): 7 - Supraganglionic infarction (M4-M6 cortex): 3 Total score (0-10 with 10 being normal): 10 IMPRESSION: 1. No acute finding or change from prior. 2. Advanced atrophy. Electronically Signed   By: Marnee Spring M.D.   On: 03/06/2018 10:05    (Echo, Carotid, EGD, Colonoscopy, ERCP)    Subjective: Patient is somnolent lying in bed comfortably.  Discharge Exam: Vitals:   03/24/18 2028 03/17/2018 0554  BP: 140/88 122/80  Pulse: 91 76  Resp:    Temp: 98 F (36.7 C) 98.7 F (37.1 C)  SpO2: 99% 96%     General: Pt is not in acute distress Cardiovascular: RRR, S1/S2 +, no rubs, no gallops Respiratory: Air movement with rhonchi and diffuse crackles bilaterally. Abdominal: Soft, NT, ND, bowel sounds + Extremities: 3+ edema he has anasarca    The results of significant diagnostics from this hospitalization (including imaging, microbiology, ancillary and laboratory) are listed below for reference.     Microbiology: Recent Results (from the past 240 hour(s))  Culture, blood (routine x 2)     Status: None (Preliminary result)   Collection Time: 04-16-18  4:00 PM  Result Value Ref Range Status   Specimen Description BLOOD RIGHT ANTECUBITAL  Final   Special Requests   Final    BOTTLES DRAWN AEROBIC AND ANAEROBIC  Blood Culture adequate volume   Culture NO GROWTH 3 DAYS  Final   Report Status PENDING  Incomplete  Culture, blood (routine x 2)     Status: None (Preliminary result)   Collection Time: 16-Apr-2018  4:09 PM  Result Value Ref Range Status   Specimen Description BLOOD LEFT ANTECUBITAL  Final   Special Requests   Final    BOTTLES DRAWN AEROBIC AND ANAEROBIC Blood Culture adequate volume   Culture NO GROWTH 3 DAYS  Final   Report Status PENDING  Incomplete  Respiratory Panel by PCR     Status: None   Collection Time: 16-Apr-2018  9:18 PM  Result Value Ref Range Status   Adenovirus NOT DETECTED NOT DETECTED  Final   Coronavirus 229E NOT DETECTED NOT DETECTED Final    Comment: (NOTE) The Coronavirus on the Respiratory Panel, DOES NOT test for the novel  Coronavirus (2019 nCoV)    Coronavirus HKU1 NOT DETECTED NOT DETECTED Final   Coronavirus NL63 NOT DETECTED NOT DETECTED Final   Coronavirus OC43 NOT DETECTED NOT DETECTED Final   Metapneumovirus NOT DETECTED NOT DETECTED Final   Rhinovirus / Enterovirus NOT DETECTED NOT DETECTED Final   Influenza A NOT DETECTED NOT DETECTED Final   Influenza B NOT DETECTED NOT DETECTED Final   Parainfluenza Virus 1 NOT DETECTED NOT DETECTED Final   Parainfluenza Virus 2 NOT DETECTED NOT DETECTED Final   Parainfluenza Virus 3 NOT DETECTED NOT DETECTED Final   Parainfluenza Virus 4 NOT DETECTED NOT DETECTED Final   Respiratory Syncytial Virus NOT DETECTED NOT DETECTED Final   Bordetella pertussis NOT DETECTED NOT DETECTED Final   Chlamydophila pneumoniae NOT DETECTED NOT DETECTED Final   Mycoplasma pneumoniae NOT DETECTED NOT DETECTED Final    Comment: Performed at [facility], [address] [place], [place] [address]     Labs: BNP (last 3 results) Recent Labs    01/30/18 1620 03/05/2018 1425  BNP 158.7* 179.4*   Basic Metabolic Panel: Recent Labs  Lab 03/07/2018 1425 03/23/18 0213  NA 147* 146*  K 2.8* 2.8*  CL 107 105  CO2 28 29   GLUCOSE 102* 101*  BUN 10 11  CREATININE 1.16 1.17  CALCIUM 8.3* 8.5*   Liver Function Tests: Recent Labs  Lab 03/13/2018 1425  AST 20  ALT 9  ALKPHOS 50  BILITOT 2.1*  PROT 5.9*  ALBUMIN 2.4*   No results for input(s): LIPASE, AMYLASE in the last 168 hours. No results for input(s): AMMONIA in the last 168 hours. CBC: Recent Labs  Lab 03/04/2018 1425 03/23/18 0213  WBC 7.0 3.9*  NEUTROABS 6.2  --   HGB 11.4* 11.1*  HCT 37.3* 34.9*  MCV 101.4* 97.2  PLT 180 169   Cardiac Enzymes: Recent Labs  Lab 02/28/2018 1425  TROPONINI 0.05*   BNP: Invalid input(s): POCBNP CBG: Recent Labs  Lab 03/23/18 0107 03/23/18 0747 03/23/18 1343 03/23/18 2111 03/23/18 2150  GLUCAP 112* 87 81 58* 64*   D-Dimer No results for input(s): DDIMER in the last 72 hours. Hgb A1c No results for input(s): HGBA1C in the last 72 hours. Lipid Profile No results for input(s): CHOL, HDL, LDLCALC, TRIG, CHOLHDL, LDLDIRECT in the last 72 hours. Thyroid function studies No results for input(s): TSH, T4TOTAL, T3FREE, THYROIDAB in the last 72 hours.  Invalid input(s): FREET3 Anemia work up No results for input(s): VITAMINB12, FOLATE, FERRITIN, TIBC, IRON, RETICCTPCT in the last 72 hours. Urinalysis    Component Value Date/Time   COLORURINE YELLOW 03/06/2018 1040   APPEARANCEUR CLEAR 03/06/2018 1040   LABSPEC 1.014 03/06/2018 1040   PHURINE 7.0 03/06/2018 1040   GLUCOSEU NEGATIVE 03/06/2018 1040   HGBUR NEGATIVE 03/06/2018 1040   BILIRUBINUR NEGATIVE 03/06/2018 1040   KETONESUR NEGATIVE 03/06/2018 1040   PROTEINUR NEGATIVE 03/06/2018 1040   UROBILINOGEN 0.2 12/30/2012 2015   NITRITE NEGATIVE 03/06/2018 1040   LEUKOCYTESUR NEGATIVE 03/06/2018 1040   Sepsis Labs Invalid input(s): PROCALCITONIN,  WBC,  LACTICIDVEN Microbiology Recent Results (from the past 240 hour(s))  Culture, blood (routine x 2)     Status: None (Preliminary result)   Collection Time: 03/12/2018  4:00 PM  Result Value  Ref Range Status   Specimen Description BLOOD RIGHT ANTECUBITAL  Final   Special Requests   Final    BOTTLES DRAWN AEROBIC AND ANAEROBIC Blood Culture adequate volume   Culture NO GROWTH 3 DAYS  Final   Report Status PENDING  Incomplete  Culture, blood (routine x 2)     Status: None (Preliminary result)   Collection Time: 04-18-2018  4:09 PM  Result Value Ref Range Status   Specimen Description BLOOD LEFT ANTECUBITAL  Final   Special Requests   Final    BOTTLES DRAWN AEROBIC AND ANAEROBIC Blood Culture adequate volume   Culture NO GROWTH 3 DAYS  Final   Report Status PENDING  Incomplete  Respiratory Panel by PCR     Status: None   Collection Time: Apr 18, 2018  9:18 PM  Result Value Ref Range Status   Adenovirus NOT DETECTED NOT DETECTED Final   Coronavirus 229E NOT DETECTED NOT DETECTED Final    Comment: (NOTE) The Coronavirus on the Respiratory Panel, DOES NOT test for the novel  Coronavirus (2019 nCoV)    Coronavirus HKU1 NOT DETECTED NOT DETECTED Final   Coronavirus NL63 NOT DETECTED NOT DETECTED Final   Coronavirus OC43 NOT DETECTED NOT DETECTED Final   Metapneumovirus NOT DETECTED NOT DETECTED Final   Rhinovirus / Enterovirus NOT DETECTED NOT DETECTED Final   Influenza A NOT DETECTED NOT DETECTED Final   Influenza B NOT DETECTED NOT DETECTED Final   Parainfluenza Virus 1 NOT DETECTED NOT DETECTED Final   Parainfluenza Virus 2 NOT DETECTED NOT DETECTED Final   Parainfluenza Virus 3 NOT DETECTED NOT DETECTED Final   Parainfluenza Virus 4 NOT DETECTED NOT DETECTED Final   Respiratory Syncytial Virus NOT DETECTED NOT DETECTED Final   Bordetella pertussis NOT DETECTED NOT DETECTED Final   Chlamydophila pneumoniae NOT DETECTED NOT DETECTED Final   Mycoplasma pneumoniae NOT DETECTED NOT DETECTED Final    Comment: Performed at [facility], [address], [place], [place] [address]     Time coordinating discharge: 40 minutes  SIGNED:   Marinda Elk, MD  [facility]

## 2018-03-27 NOTE — Care Management Note (Addendum)
Case Management Note  Patient Details  Name: [name] MRN: [id number] Date of Birth: [birth year]  Subjective/Objective: Pt presented for Acute respiratory failure with hypoxia. PTA from home with support of spouse. Palliative following the patient-family is agreeable to Hospice [facility]idential Facility.                 Action/Plan: Family wanted [facility] [facility]. CM did call CSW to make her aware that choice was offered. CSW made Referral to [facility] for Hospice Residential Facility bed. No further needs from CM at this time.   Expected Discharge Date:                  Expected Discharge Plan:  [facility]ospice [facility] Facility  In-House Referral:  Clinical Social Work, Hospice / Palliative Care  Discharge planning Services  CM Consult  Post Acute Care Choice:  NA Choice offered to:  NA  DME Arranged:  N/A DME Agency:  NA  HH Arranged:  NA HH Agency:  NA  Status of Service:  Completed, signed off  If discussed at Long Length of Stay Meetings, dates discussed:    Additional Comments: 1010 22-Apr-2018 Tomi Bamberger, RN,BSN [phone] Plan was to transition patient to Residential Hospice Facility- Palliative Care visited the patient this am and anticipates hospital death. CM will continue to monitor for additional transition of care needs.  Gala Lewandowsky, RN 03/24/2018, 2:30 PM

## 2018-03-27 DEATH — deceased

## 2018-04-15 ENCOUNTER — Ambulatory Visit: Payer: Medicare Other | Admitting: Vascular Surgery

## 2018-04-15 ENCOUNTER — Encounter (HOSPITAL_COMMUNITY): Payer: Medicare Other

## 2018-04-22 ENCOUNTER — Ambulatory Visit: Payer: Medicare Other | Admitting: Infectious Disease

## 2018-04-25 ENCOUNTER — Ambulatory Visit: Payer: Medicare Other | Admitting: Neurology

## 2019-10-08 IMAGING — MR MR MRA HEAD W/O CM
10 of 12 series · 34 of 48 positions shown · non-contrast
Comparison: Prior CT from 11/03/2017.

CLINICAL DATA: Initial evaluation for acute altered mental status,
facial droop, left arm weakness.

EXAM:
MRI HEAD WITHOUT CONTRAST
MRA HEAD WITHOUT CONTRAST
TECHNIQUE: Multiplanar, multiecho pulse sequences of the brain and surrounding
structures were obtained without intravenous contrast. Angiographic
images of the head were obtained using MRA technique without
contrast.

[Series 5: DWI · axial · 4.0mm · 0.92mm/px · z∈[-17,+138]mm · 7 of 80 slices shown (1 of 4)]
[im 1/80]
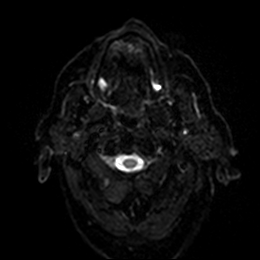
[im 14/80]
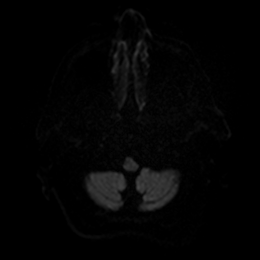
[im 27/80]
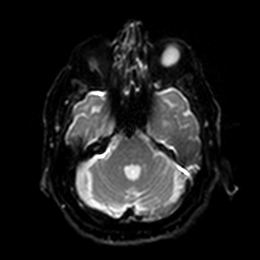
[im 40/80]
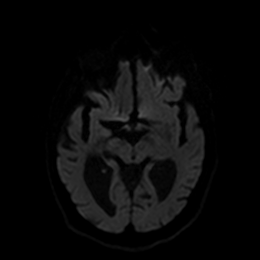
[im 53/80]
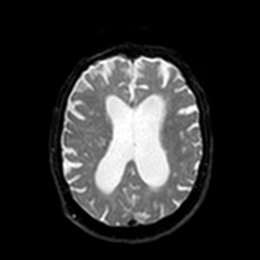
[im 66/80]
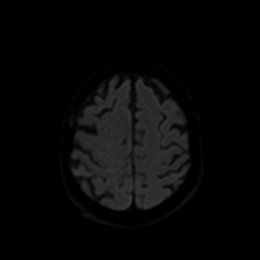
[im 80/80]
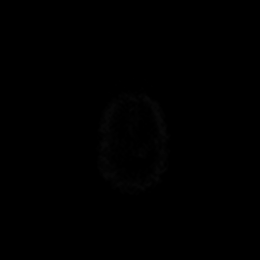

[Series 6: DWI · axial · 4.0mm · 0.92mm/px · z∈[-17,+138]mm · 3 of 40 slices shown (2 of 4)]
[im 1/40]
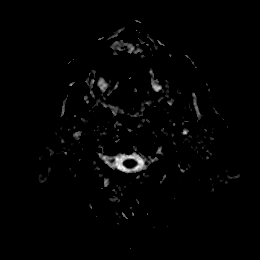
[im 20/40]
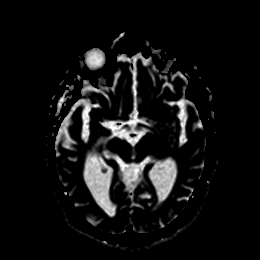
[im 40/40]
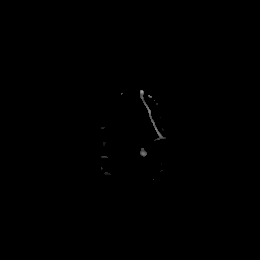

[Series 7: FLAIR · axial · 5.0mm · 0.47mm/px · z∈[-9,+133]mm · 2 of 25 slices shown]
[im 1/25]
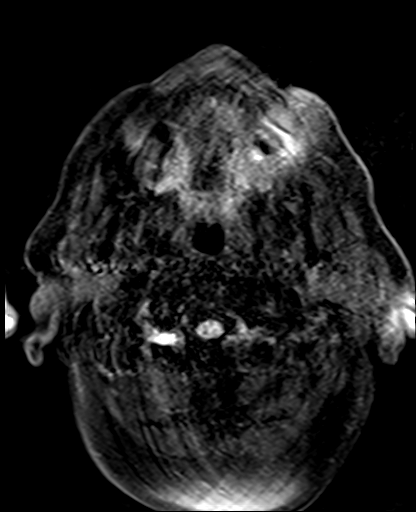
[im 25/25]
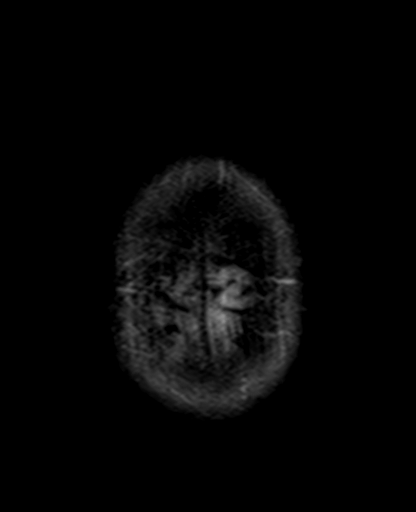

[Series 8: swi_images · axial · 3.0mm · 0.94mm/px · z∈[-16,+136]mm · 4 of 52 slices shown]
[im 1/52]
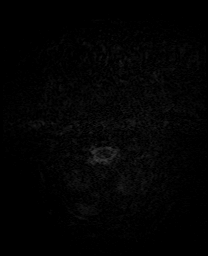
[im 18/52]
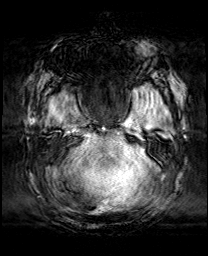
[im 35/52]
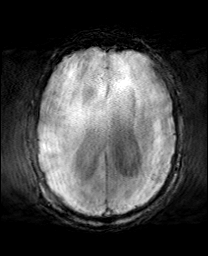
[im 52/52]
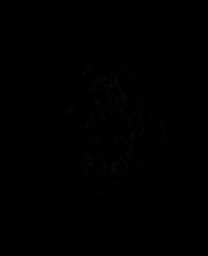

[Series 9: mip_images(sw) · axial · 24.0mm · 0.94mm/px · z∈[-5,+126]mm · 4 of 45 slices shown]
[im 1/45]
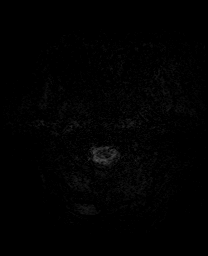
[im 15/45]
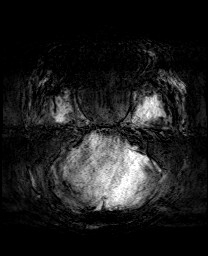
[im 30/45]
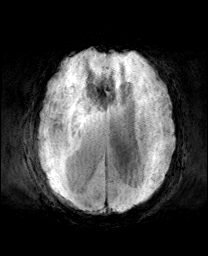
[im 45/45]
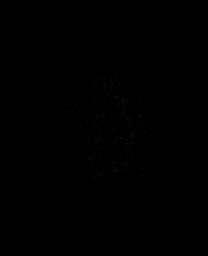

[Series 10: T2 · axial · 5.0mm · 0.75mm/px · z∈[-6,+136]mm · 2 of 25 slices shown (1 of 2)]
[im 1/25]
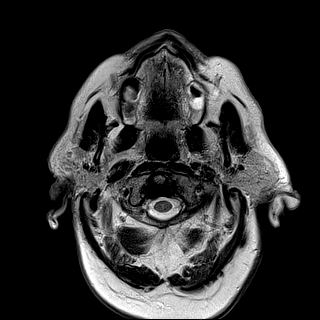
[im 25/25]
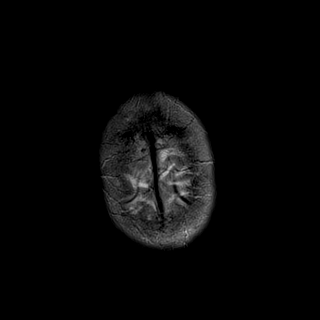

[Series 11: DWI · coronal · 4.0mm · 0.88mm/px · 5 of 64 slices shown (3 of 4)]
[im 1/64]
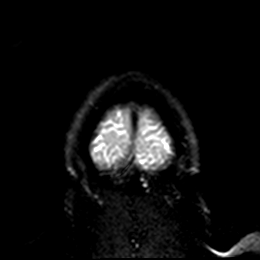
[im 16/64]
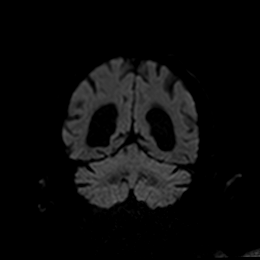
[im 32/64]
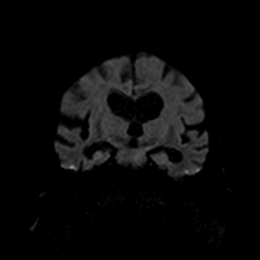
[im 48/64]
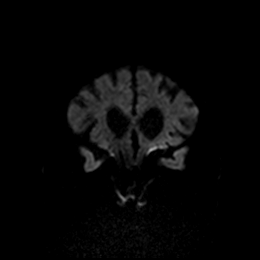
[im 64/64]
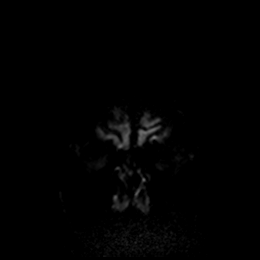

[Series 12: DWI · coronal · 4.0mm · 0.88mm/px · 3 of 32 slices shown (4 of 4)]
[im 1/32]
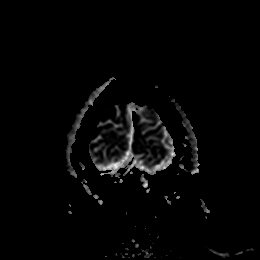
[im 16/32]
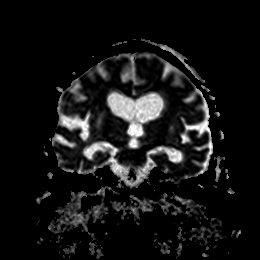
[im 32/32]
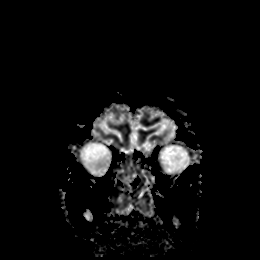

[Series 13: T1 · sagittal · 5.0mm · 0.75mm/px · 2 of 23 slices shown]
[im 1/23]
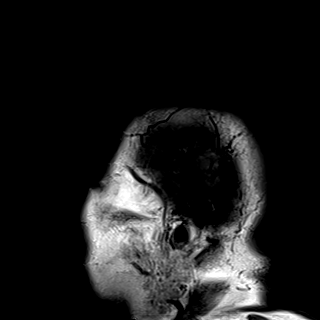
[im 23/23]
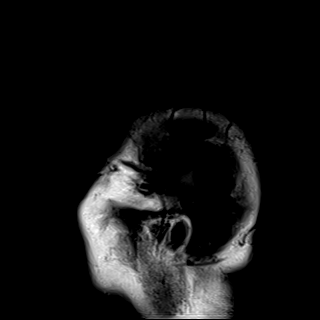

[Series 19: T2 · coronal · 5.0mm · 0.34mm/px · 2 of 27 slices shown (2 of 2)]
[im 1/27]
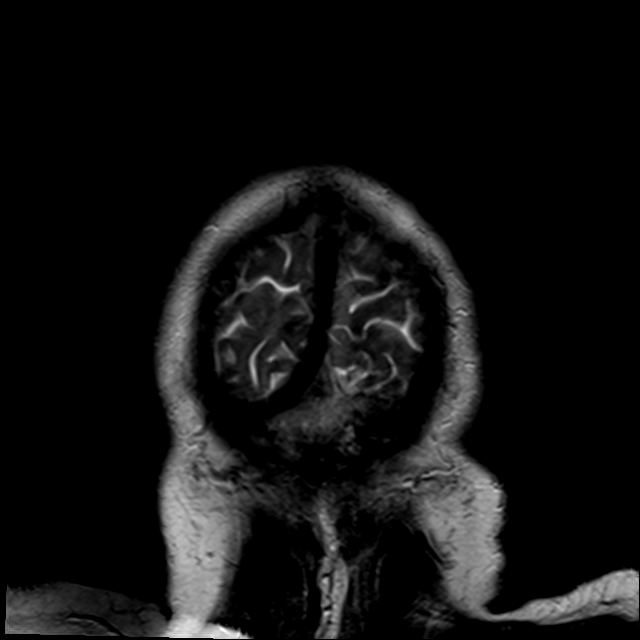
[im 27/27]
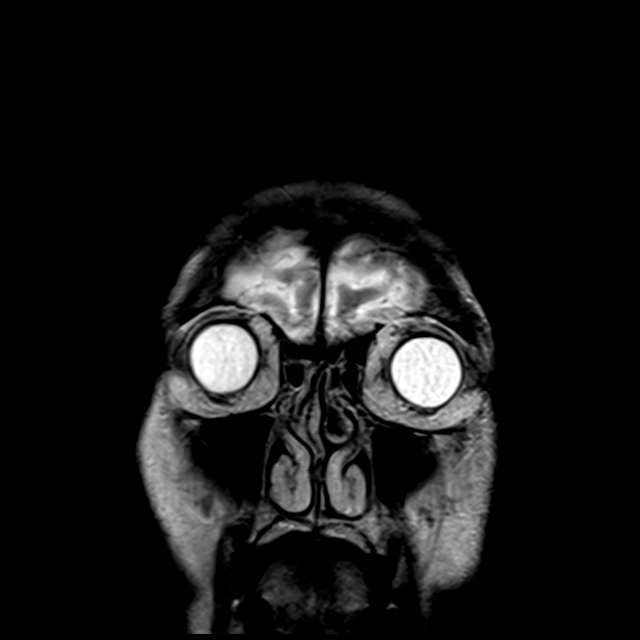

[34 of 48 positions shown; findings below may reference images not displayed]

FINDINGS: MRI HEAD FINDINGS

Brain: Examination moderately degraded by motion artifact.

Diffuse prominence of the CSF containing spaces compatible with
generalized cerebral atrophy. Probable mild chronic microvascular
ischemic changes present within the periventricular white matter.

No abnormal foci of restricted diffusion to suggest acute or
subacute ischemia. Gray-white matter differentiation maintained. No
areas of remote cortical infarction. No acute or chronic
intracranial hemorrhage.

No mass lesion, midline shift or mass effect. Diffuse ventricular
prominence related global parenchymal volume loss of hydrocephalus.
No extra-axial fluid collection. Pituitary gland grossly normal.

Vascular: Major intracranial vascular flow voids are maintained.

Skull and upper cervical spine: Craniocervical junction normal. No
focal marrow replacing lesion. Scalp soft tissues unremarkable.

Sinuses/Orbits: Globes and orbital soft tissues within normal
limits. Patient status post ocular lens replacement bilaterally.
Mild scattered mucosal thickening throughout the ethmoidal air cells
and maxillary sinuses. Paranasal sinuses are otherwise clear. Trace
opacity bilateral mastoid air cells, of doubtful significance. Inner
ear structures grossly normal.

Other: None.

MRA HEAD FINDINGS

ANTERIOR CIRCULATION:

Examination moderately degraded by motion artifact.

Distal cervical segments of the internal carotid arteries are widely
patent with antegrade flow. Petrous segments widely patent
bilaterally. Atheromatous irregularity within the
cavernous/supraclinoid ICAs without high-grade stenosis. A1 segments
patent bilaterally. Anterior communicating artery not well assessed
due to motion. Anterior cerebral arteries patent distally without
obvious stenosis. M1 segments patent bilaterally. Normal MCA
bifurcations. No appreciable proximal M2 occlusion distal MCA
branches well perfused and symmetric.

POSTERIOR CIRCULATION:

Scattered atheromatous irregularity within the vertebral arteries
bilaterally without high-grade stenosis. Patent right PICA. Left
PICA not visualized. Basilar widely patent to its distal aspect.
Superior cerebral arteries patent bilaterally. Both of the PCA
supplied via the basilar. Scattered atheromatous irregularity
throughout the visualized proximal and mid PCAs. PCAs not well
assessed distally due to motion.

No obvious intracranial aneurysm.
IMPRESSION: MRI HEAD IMPRESSION:

1. Limited examination due to extensive motion artifact.
2. No acute intracranial infarct or other abnormality identified.
3. Moderately advanced cerebral atrophy with mild chronic small
vessel ischemic disease.

MRA HEAD IMPRESSION:

1. Limited examination due to extensive motion artifact.
2. Negative intracranial MRA for large vessel occlusion. Moderate
intracranial atherosclerotic change without hemodynamically
significant or correctable stenosis.
